# Patient Record
Sex: Female | Born: 1985 | Race: Black or African American | Marital: Single | Smoking: Current every day smoker
Health system: Northeastern US, Academic
[De-identification: ages and names within clinical notes are randomized; demographics above are authoritative.]

## PROBLEM LIST (undated history)

## (undated) DIAGNOSIS — F53 Postpartum depression: Secondary | ICD-10-CM

## (undated) DIAGNOSIS — R011 Cardiac murmur, unspecified: Secondary | ICD-10-CM

## (undated) DIAGNOSIS — Z6841 Body Mass Index (BMI) 40.0 and over, adult: Secondary | ICD-10-CM

## (undated) DIAGNOSIS — T1490XA Injury, unspecified, initial encounter: Secondary | ICD-10-CM

## (undated) DIAGNOSIS — I509 Heart failure, unspecified: Secondary | ICD-10-CM

## (undated) DIAGNOSIS — J45909 Unspecified asthma, uncomplicated: Secondary | ICD-10-CM

## (undated) DIAGNOSIS — L732 Hidradenitis suppurativa: Secondary | ICD-10-CM

## (undated) DIAGNOSIS — D649 Anemia, unspecified: Secondary | ICD-10-CM

## (undated) DIAGNOSIS — F1410 Cocaine abuse, uncomplicated: Secondary | ICD-10-CM

## (undated) DIAGNOSIS — Z720 Tobacco use: Secondary | ICD-10-CM

## (undated) DIAGNOSIS — B019 Varicella without complication: Secondary | ICD-10-CM

## (undated) DIAGNOSIS — Z9889 Other specified postprocedural states: Secondary | ICD-10-CM

## (undated) DIAGNOSIS — R112 Nausea with vomiting, unspecified: Secondary | ICD-10-CM

## (undated) DIAGNOSIS — Z87442 Personal history of urinary calculi: Secondary | ICD-10-CM

## (undated) DIAGNOSIS — T7840XA Allergy, unspecified, initial encounter: Secondary | ICD-10-CM

## (undated) DIAGNOSIS — G43909 Migraine, unspecified, not intractable, without status migrainosus: Secondary | ICD-10-CM

## (undated) DIAGNOSIS — F32A Depression, unspecified: Secondary | ICD-10-CM

## (undated) DIAGNOSIS — F419 Anxiety disorder, unspecified: Secondary | ICD-10-CM

## (undated) DIAGNOSIS — N189 Chronic kidney disease, unspecified: Secondary | ICD-10-CM

## (undated) DIAGNOSIS — N289 Disorder of kidney and ureter, unspecified: Secondary | ICD-10-CM

## (undated) DIAGNOSIS — F329 Major depressive disorder, single episode, unspecified: Secondary | ICD-10-CM

## (undated) HISTORY — DX: Hidradenitis suppurativa: L73.2

## (undated) HISTORY — PX: TIBIA FRACTURE SURGERY: SHX806

## (undated) HISTORY — DX: Varicella without complication: B01.9

## (undated) HISTORY — DX: Injury, unspecified, initial encounter: T14.90XA

## (undated) HISTORY — PX: DENTAL SURGERY: SHX609

## (undated) HISTORY — DX: Cardiac murmur, unspecified: R01.1

## (undated) HISTORY — PX: GALLBLADDER SURGERY: SHX652

## (undated) HISTORY — DX: Anemia, unspecified: D64.9

## (undated) HISTORY — DX: Postpartum depression: F53.0

## (undated) HISTORY — DX: Allergy, unspecified, initial encounter: T78.40XA

## (undated) HISTORY — PX: MANDIBLE SURGERY: SHX707

## (undated) HISTORY — DX: Chronic kidney disease, unspecified: N18.9

## (undated) MED FILL — Methylprednisolone Sod Succ For Inj 125 MG (Base Equiv): INTRAMUSCULAR | Qty: 1 | Status: AC

## (undated) MED FILL — Famotidine Preservative Free Inj 20 MG/2ML: INTRAVENOUS | Qty: 2 | Status: AC

## (undated) MED FILL — Methylprednisolone Sod Succ For Inj PF 125 MG (Base Equiv): INTRAMUSCULAR | Qty: 1 | Status: AC

---
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
## 2005-09-03 DIAGNOSIS — S92309A Fracture of unspecified metatarsal bone(s), unspecified foot, initial encounter for closed fracture: Secondary | ICD-10-CM | POA: Insufficient documentation

## 2009-03-15 NOTE — L&D Delivery Note (Addendum)
Delivery Summary Note  Patient: Jenna [name]  Age: 24 y.o.  Date of Birth: 12-06-1985  ZOX:WRUEAV.  MRN: [id number]    Admission Summary  Date and time of admission: 11/26/2009  8:30 AM Attending Provider: [name] Rower, MD Group:  Active Hospital Problems   Diagnoses   . Cocaine abuse complicating pregnancy       Review of patient's allergies indicates no known allergies.  Pre-pregnancy weight:PrePregnancy Weight: 105.235 kg (232 lb) Current Weight:   Weight gain:     Obstetric History    G5   P4   T4   P0   A1   TAB1   SAB0   E0   M0   L3         Feeding Type:     Circumcision:     Pediatrician:     Prenatal Labs  RUBELLA IGG AB   Date Value Range Status   08/12/2009 IMMUNE  - (no units) Final    TEST METHOD: EIA      Strep B Culture   Date Value Range Status   11/04/2009 .  - (no units) Final        Gestational Age Information  LMP: No LMP recorded.   EDC: 12/03/2009, by Ultrasound  Gestational Age at Delivery: Information for the patient's newborn:   Noah, Rin[place] Girl [[id number]]   Gestational Age: 90 weeks.       Labor Summary  Labor Events:    Preterm labor: No   Rupture date: 11/26/2009   Rupture time: 10:24 AM   Rupture type: Artificial   Fluid Color: Clear   Induction: None   Augmentation: AROM   Complications:    Cervical ripening:         Stage 1:  hr minutes   Stage 2:  hr minutes   Stage 3: 0 hr       Delivery:    Episiotomy: None   Lacerations: None   Repair suture:    Repair # of packets: 4   Blood loss (ml): 700     Information for the patient's newborn:   Zhania, [place] Girl [[id number]]   Delivery  Patient: Girl Hasbrook  VHQ:IONGEX GA: Gestational Age: 90 weeks. MRN: [id number]  11/26/2009 10:24 AM by  C-Section, Low Transverse    Delivery Clinician:  [name] Rower  Living?: Yes  Anesthesia: Spinal         APGARS  One minute Five minutes Ten minutes   Skin color: 0   0   1     Heart rate: 2   2   2      Grimace: 2   2   2      Muscle tone: 2   2   2      Breathing: 2   2   2      Totals: 8  8  9     Presentation/position:  Vertex  Left Occiput Anterior  Resuscitation: Suctioning   Cord information: 3 Vessels   Disposition of cord blood: Lab    Blood gases sent? Yes  Complications: Nuchal   Placenta: Delivered: 11/26/2009 10:27 AM  Manual Removal  Intact appearance  Newborn Measurements:  Weight: 7 lb 13.2 oz (3549 g)  Height:   Head circumference:   Chest circumference:    Other providers: NICU/SCN NP  NICU/SCN Resident  NICU/SCN RN  Delivery Nurse  Delivery Assist Delmer Islam Belardino  Desiree M Stockholm  Wister  Additional  information:  Forceps:    Vacuum:    Breech:    Observed anomalies            Scheduled repeat LTCS. Viable female infant delivered in LOA presentation. Nuchalx1  Clear fluid. Gases obtained and sent. Placenta intact - discarded.   Single layer uterine closure with 2 additional figure of 8 suture in mid-hysterotomy region for hemostasis. Vicryl on fascia. 3 interrupted suture in sub-Q for reapproximation. Staples on skin.   Tegaderm dressing.     Edd Arbour, MD   OBGYN Resident   Pager# [phone]     Staffed repeat Cesarean section without complications.    Gunnar Fusi MD  Attending, Maternal-Fetal Medicine

## 2009-05-13 ENCOUNTER — Emergency Department: Admit: 2009-05-13 | Discharge status: Against Medical Advice | Payer: Self-pay | Source: Ambulatory Visit

## 2009-06-16 ENCOUNTER — Ambulatory Visit: Payer: Self-pay

## 2009-07-03 ENCOUNTER — Ambulatory Visit: Payer: Self-pay

## 2009-08-12 ENCOUNTER — Other Ambulatory Visit: Payer: Self-pay | Admitting: Obstetrics and Gynecology

## 2009-08-12 ENCOUNTER — Ambulatory Visit: Payer: Self-pay

## 2009-08-12 ENCOUNTER — Encounter: Payer: Self-pay | Admitting: Gastroenterology

## 2009-08-12 LAB — CBC
Hematocrit: 32
Hemoglobin: 10.5
Platelets: 245

## 2009-08-12 LAB — DRUG SCREEN CHEMICAL DEPENDENCY, URINE
Amphetamine,UR: NEGATIVE
Benzodiazepinen,UR: NEGATIVE
Cocaine/Metab,UR: NEGATIVE
Opiates,UR: NEGATIVE
THC Metabolite,UR: NEGATIVE

## 2009-08-12 LAB — GLUCOSE: Glucose,WB: 122

## 2009-08-12 LAB — RUBELLA ANTIBODY, IGG: Rubella IgG AB: IMMUNE

## 2009-08-12 LAB — TYPE AND SCREEN FOR PNP
ABO RH Blood Type: A POS
Antibody Screen: NEGATIVE

## 2009-08-12 LAB — GLUCOSE TOLERANCE, 1 HOUR: Glucose,50gm 1HR: 122 mg/dL (ref 63–135)

## 2009-08-12 LAB — TYPE AND SCREEN: Antibody Screen: NEGATIVE

## 2009-08-12 LAB — RPR: RPR Screen: NONREACTIVE

## 2009-08-13 LAB — OBSTETRICS PANEL
Baso # K/uL: 0 THOU/uL (ref 0.0–0.1)
Basophil %: 0.2 % (ref 0.1–1.2)
Eos # K/uL: 0.1 THOU/uL (ref 0.0–0.4)
Eosinophil %: 1.1 % (ref 0.7–5.8)
Hematocrit: 32 % — ABNORMAL LOW (ref 34–45)
Hemoglobin: 10.5 g/dL — ABNORMAL LOW (ref 11.2–15.7)
Lymph # K/uL: 2.3 THOU/uL (ref 1.2–3.7)
Lymphocyte %: 21.6 % (ref 19.3–51.7)
MCV: 93 fL (ref 79–95)
Mono # K/uL: 0.7 THOU/uL (ref 0.2–0.9)
Monocyte %: 6.7 % (ref 4.7–12.5)
Neut # K/uL: 7.6 THOU/uL — ABNORMAL HIGH (ref 1.6–6.1)
Platelets: 245 THOU/uL (ref 160–370)
RBC: 3.5 MIL/uL — ABNORMAL LOW (ref 3.9–5.2)
RDW: 16 % — ABNORMAL HIGH (ref 11.7–14.4)
RPR Screen: NONREACTIVE
Rubella IgG AB: IMMUNE
Seg Neut %: 70.4 % (ref 34.0–71.1)
WBC: 10.8 THOU/uL — ABNORMAL HIGH (ref 4.0–10.0)

## 2009-08-13 LAB — HEPATITIS B SURFACE ANTIGEN: HBV S Ag: NEGATIVE

## 2009-08-13 LAB — AEROBIC CULTURE: Aerobic Culture: 0

## 2009-08-13 LAB — HIV-1 AND 2 AB: HIV 1&2 Ab screen: NEGATIVE

## 2009-08-13 LAB — HEMOGLOBIN ELECTROPHORESIS
Hgb A1: 97.6 % (ref 96.1–98.0)
Hgb A2: 2.4 % (ref 0.0–3.5)

## 2009-08-14 LAB — LEAD VENOUS: Lead,Venous: 1 ug/dL (ref 0–25)

## 2009-08-20 LAB — CYSTIC FIBROSIS DNA (BAYLOR)

## 2009-08-22 ENCOUNTER — Observation Stay
Admit: 2009-08-22 | Disposition: A | Discharge status: Home / Self Care | Payer: Self-pay | Source: Ambulatory Visit | Attending: Obstetrics & Gynecology | Admitting: Obstetrics & Gynecology

## 2009-08-22 ENCOUNTER — Encounter: Payer: Self-pay | Admitting: Obstetrics & Gynecology

## 2009-08-22 LAB — URINALYSIS WITH REFLEX TO CULTURE
Specific Gravity,UA: 1.021 (ref 1.002–1.030)
pH,UA: 6.5 (ref 5.0–8.0)

## 2009-08-22 LAB — GRAM STAIN

## 2009-08-22 LAB — URINALYSIS REFLEX TO CULTURE
Blood,UA: NEGATIVE
Ketones, UA: NEGATIVE
Nitrite,UA: NEGATIVE
Protein,UA: 10 mg/dL — AB

## 2009-08-22 LAB — URINE MICROSCOPIC (IQ200)
RBC,UA: 1 /HPF (ref 0–2)
WBC,UA: 1 /HPF (ref 0–5)

## 2009-08-22 MED ORDER — METRONIDAZOLE 500 MG PO TABS *I*
500.0000 mg | ORAL_TABLET | Freq: Two times a day (BID) | ORAL | Status: AC
Start: 2009-08-22 — End: 2009-08-29

## 2009-08-22 NOTE — Discharge Instructions (Signed)
Reason for Triage Visit: vaginal discharge  Destination: Home  Mode: Ambulatory    Procedures done in triage: Fetal monitoring, Sterile speculum exam and Cervical exam  Pending Laboratory Data: Vaginal culture (s) and GC/Chl, axillary culture, UA    Diagnosis: There are no hospital problems to display for this patient.      Diet: Regular and Drink at least 10-12 glasses of fluid daily    Activity: Regular activity    Special Instructions: call for appt at [facility] as soon as possible for prenatal care.

## 2009-08-22 NOTE — Progress Notes (Signed)
TRIAGE NOTE:    CC: abdominal pain, axillary bumps, vaginal discharge      HPI:  24 y.o. G1P0 at 25.[redacted] weeks EGA presents with complaint of fishy smelling vaginal discharge.  Discharge is white.  Patient just released from jail yesterday for crack/parole violation.  She has abdominal pain on right mid abdomen that is mild, but constant.  Pain is not contractions like.   She also reports recurrent armpit bumps.  She has two currently, one is oozing.  She denies vaginal bleeding and feels good FM.   No urinary symptoms currently but patient recently on antibiotic for 4 days for concern for UTI.  She was told to stop this medication.       PREGNANCY RISKS:  PSA  Recent incarceration  Hx C/S  Hx of infant death from SIDS      OBHX  OB History    Grav Para Term Preterm Abortions TAB SAB Ect Mult Living    1                # Outc Date GA Lbr Len/2nd Wgt Sex Del Anes PTL Lv    1 CUR                     ALL:  Allergies no known allergies     SocHx:  History   Social History   . Marital Status: Single     Spouse Name: N/A     Number of Children: N/A   . Years of Education: N/A   Occupational History   . Not on file.   Social History Main Topics   . Smoking status: Not on file   . Smokeless tobacco: Not on file   . Alcohol Use: Not on file   . Drug Use: Not on file   . Sexually Active: Not on file   Other Topics Concern   . Not on file   Social History Narrative   . No narrative on file          PE:  Patient Vitals in the past 24 hrs:   BP Temp Temp src Pulse Resp   08/22/09 1710 120/79 mmHg 36.8 C (98.2 F) TEMPORAL 88  18        HEENT: Normocephalic; atraumatic  Cardiovascular: Not assessed  Respiratory: Not assessed  Abdomen: Gravid non-tender and no tenderness to palpation, no CVA tenderness  Neurological: Not assessed  Extremities/Skin: Right axillary pustule with yellow discharge, Second inflamed raised lesion without induration or fluctuance.      Pelvic Exam:                Dilation: 0 cm          Effacement: 0%              Station: Not Applicable    Exam method: SSE/digital  Pooling: negative  Nitrazine: n/a  Membranes:  intact  Cultures collected: gonorrhea/chlamydia, right axillary dishcharge  Wet prep:  Whiff: yes     Clue cells: yes      Trich: no       Yeast:  no      EFM:  Baseline:  140 bpm       Variability:  moderate     Accels: present     Decels: none  Cat  one  Toco: one contraction not appreciated by patient  Labor Assessment:  No labor    Labs:  No results found for this or any  previous visit (from the past 24 hour(s)).      A/P: 24 y/o G5P3012 at 25.[redacted] weeks EGA with BV infection and likely inflamed folliculitis vs furuncle.    Furuncle/Folliculitis - discharge from wound cultured.  F/U on results.  Recommend scheduling appt with internal medicine for further evaluation.    GC/Chl sent for culture  BV - Flagyl 500mg  BID x 7 days  FHR - Cat I, reactive by 10x10 criteria.  Abdominal pain benign.  Likely associated with BV infection.  PTL precautions given.    F/U with [facility] as soon as possible for prenatal care.

## 2009-08-25 LAB — CHLAMYDIA PLASMID DNA AMPLIFICATION: Chlamydia Plasmid DNA Amplification: 0

## 2009-08-25 LAB — N. GONORRHOEAE DNA AMPLIFICATION: N. gonorrhoeae DNA Amplification: 0

## 2009-08-26 LAB — ANAEROBIC CULTURE

## 2009-08-26 LAB — AEROBIC CULTURE

## 2009-08-27 ENCOUNTER — Ambulatory Visit: Payer: Self-pay

## 2009-09-04 ENCOUNTER — Other Ambulatory Visit: Payer: Self-pay | Admitting: Obstetrics & Gynecology

## 2009-09-04 ENCOUNTER — Ambulatory Visit: Payer: Self-pay

## 2009-09-05 ENCOUNTER — Ambulatory Visit: Admit: 2009-09-05 | Payer: Self-pay | Source: Ambulatory Visit | Admitting: Obstetrics and Gynecology

## 2009-09-05 LAB — AEROBIC CULTURE: Aerobic Culture: 0

## 2009-09-18 ENCOUNTER — Ambulatory Visit: Payer: Self-pay

## 2009-09-25 ENCOUNTER — Ambulatory Visit: Payer: Self-pay

## 2009-09-25 ENCOUNTER — Other Ambulatory Visit: Payer: Self-pay | Admitting: Gastroenterology

## 2009-10-09 ENCOUNTER — Ambulatory Visit: Payer: Self-pay | Admitting: Nutritionist

## 2009-10-09 ENCOUNTER — Ambulatory Visit: Payer: Self-pay

## 2009-10-23 ENCOUNTER — Ambulatory Visit: Payer: Self-pay

## 2009-11-04 ENCOUNTER — Ambulatory Visit: Payer: Self-pay

## 2009-11-04 ENCOUNTER — Other Ambulatory Visit: Payer: Self-pay | Admitting: Obstetrics and Gynecology

## 2009-11-04 LAB — CBC
Hematocrit: 30 % — ABNORMAL LOW (ref 34–45)
Hemoglobin: 10.1 g/dL — ABNORMAL LOW (ref 11.2–15.7)
MCV: 92 fL (ref 79–95)
Platelets: 214 THOU/uL (ref 160–370)
RBC: 3.3 MIL/uL — ABNORMAL LOW (ref 3.9–5.2)
RDW: 14.7 % — ABNORMAL HIGH (ref 11.7–14.4)
WBC: 10.7 THOU/uL — ABNORMAL HIGH (ref 4.0–10.0)

## 2009-11-04 LAB — GC CULTURE: GC Culture: NEGATIVE

## 2009-11-04 LAB — CHLAMYDIA PLASMID DNA AMPLIFICATION: Chlamydia Plasmid DNA Amplification: NEGATIVE

## 2009-11-04 LAB — HIV-1 AND 2 AB: HIV 1&2 Ab screen: NEGATIVE

## 2009-11-05 LAB — CHLAMYDIA PLASMID DNA AMPLIFICATION: Chlamydia Plasmid DNA Amplification: 0

## 2009-11-05 LAB — HIV-1 AND 2 AB: HIV 1&2 Ab screen: NEGATIVE

## 2009-11-05 LAB — RPR: RPR Screen: NONREACTIVE

## 2009-11-05 LAB — N. GONORRHOEAE DNA AMPLIFICATION: N. gonorrhoeae DNA Amplification: 0

## 2009-11-06 LAB — GROUP B STREP CULTURE: Group B Strep Culture: 0

## 2009-11-09 LAB — GYN CYTOLOGY

## 2009-11-11 ENCOUNTER — Ambulatory Visit: Payer: Self-pay

## 2009-11-18 ENCOUNTER — Ambulatory Visit: Payer: Self-pay

## 2009-11-18 LAB — COMPREHENSIVE METABOLIC PANEL
ALT: 11 U/L (ref 0–35)
AST: 18 U/L (ref 0–35)
Albumin: 3.8 g/dL (ref 3.5–5.2)
Alk Phos: 133 U/L — ABNORMAL HIGH (ref 35–105)
Anion Gap: 8 (ref 7–16)
Bilirubin,Total: 0.2 mg/dL (ref 0.0–1.2)
CO2: 24 mmol/L (ref 20–28)
Calcium: 10 mg/dL (ref 9.0–10.4)
Chloride: 104 mmol/L (ref 96–108)
Creatinine: 0.59 mg/dL (ref 0.51–0.95)
GFR,Black: >59 *
GFR,Caucasian: >59 *
Glucose: 75 mg/dL (ref 74–106)
Lab: 4 mg/dL — ABNORMAL LOW (ref 6–20)
Potassium: 4.3 mmol/L (ref 3.3–5.1)
Sodium: 136 mmol/L (ref 133–145)
Total Protein: 6.5 g/dL (ref 6.3–7.7)

## 2009-11-18 LAB — GRAM STAIN

## 2009-11-18 LAB — CBC
Hematocrit: 33 % — ABNORMAL LOW (ref 34–45)
Hemoglobin: 10.6 g/dL — ABNORMAL LOW (ref 11.2–15.7)
MCV: 94 fL (ref 79–95)
Platelets: 223 THOU/uL (ref 160–370)
RBC: 3.5 MIL/uL — ABNORMAL LOW (ref 3.9–5.2)
RDW: 15.1 % — ABNORMAL HIGH (ref 11.7–14.4)
WBC: 10.3 THOU/uL — ABNORMAL HIGH (ref 4.0–10.0)

## 2009-11-18 LAB — URIC ACID: Urate: 5.5 mg/dL (ref 2.7–6.8)

## 2009-11-18 LAB — LACTATE DEHYDROGENASE: LD: 163 U/L (ref 118–225)

## 2009-11-18 LAB — MRSA (ORSA) AMPLIFICATION: MRSA (ORSA) Amplification: 0

## 2009-11-20 LAB — AEROBIC CULTURE

## 2009-11-23 ENCOUNTER — Observation Stay
Admit: 2009-11-23 | Disposition: A | Discharge status: Home / Self Care | Payer: Self-pay | Source: Ambulatory Visit | Attending: Obstetrics & Gynecology | Admitting: Obstetrics & Gynecology

## 2009-11-23 ENCOUNTER — Encounter: Payer: Self-pay | Admitting: Maternal & Fetal Medicine

## 2009-11-23 DIAGNOSIS — Z720 Tobacco use: Secondary | ICD-10-CM | POA: Insufficient documentation

## 2009-11-23 DIAGNOSIS — L732 Hidradenitis suppurativa: Secondary | ICD-10-CM | POA: Insufficient documentation

## 2009-11-23 DIAGNOSIS — Z98891 History of uterine scar from previous surgery: Secondary | ICD-10-CM | POA: Insufficient documentation

## 2009-11-23 DIAGNOSIS — Z331 Pregnant state, incidental: Secondary | ICD-10-CM | POA: Insufficient documentation

## 2009-11-23 LAB — URINALYSIS WITH REFLEX TO CULTURE
Specific Gravity,UA: 1.007 (ref 1.002–1.030)
pH,UA: 7 (ref 5.0–8.0)

## 2009-11-23 LAB — URINALYSIS REFLEX TO CULTURE
Blood,UA: NEGATIVE
Ketones, UA: NEGATIVE
Leuk Esterase,UA: NEGATIVE
Nitrite,UA: NEGATIVE
Protein,UA: NEGATIVE mg/dL

## 2009-11-23 MED ORDER — LABETALOL HCL 200 MG PO TABS *I*
200.0000 mg | ORAL_TABLET | Freq: Once | ORAL | Status: DC
Start: 2009-11-23 — End: 2009-11-23

## 2009-11-23 NOTE — Discharge Instructions (Signed)
Reason for Triage Visit: NST and Rule out ROM  Destination: Home  Mode: Ambulatory    Procedures done in triage: Fetal monitoring and Sterile speculum exam  Pending Laboratory Data: None    Diagnosis: Active Hospital Problems   Diagnoses   . Pregnancy [V22.2C]   . Cesarean section [669.70AH]   . Hidradenitis [705.83]   . Tobacco abuse [305.1U]      Resolved Hospital Problems   Diagnoses         Diet: Regular    Activity: Regular activity    Special Instructions: none

## 2009-11-23 NOTE — Progress Notes (Signed)
Pt into triage via ambulance from [facility]. ED Referral states FHR found to be 210bpm. Pt c/o DFM, and increase in vaginal d/c. Placed on EFM, and Dr Onnie Boer notified of her arrival.

## 2009-11-23 NOTE — OB Triage Note (Signed)
OB Triage Note    CC: fetal tachycardia noted on dop tones at jail    HPI: This is a 24 yo [id number] incarcerated female at 35 4/7 weeks who presents after dop tones at jail showed a fetal heart rate in the 220s. The patient denies contractions, leakage of fluid or vaginal bleeding. She has noticed urinary frequency. No dysuria, fever or chills. Her axillary hidradenitis is still draining, but she believes it is improving. No other complaints.    Pregnancy Risks:   Depression (no meds)  History of LTCS (desires repeat LTCS)  Incarcerated  Crack use in pregnancy  Tobacco use  Axillary hidradenitis    Vital Signs:  Blood pressure 107/68, pulse 82, temperature 36.5 C (97.7 F), temperature source Temporal, resp. rate 20.    Physical Exam:  General- NAD  Cardiac- RRR  Resp- CTAB/L  Abdomen- soft, gravid, non-tender  Ext- trace edema    Cervix:  Visually closed    Fetal Heart Tracing:  Baseline: 135  Variability: moderate  Accels: present  Decels: none  Category: I    No pooling, negative nitrazine, no ferning.    Toco: no ctx    Wet Prep: no clue cells, no yeast, negative whiff, no trichomonas    Assessment/Plan:  This is a 24 yo G5P3012 at 1 4/7 weeks with fetal tachycardia noted on dop tones.    1. FHT: Category I. Reactive NST.  2. Toco: no ctx  3. Labor: No evidence of labor. Cervix is closed.  4. Leakage of fluid: No evidence of rupture of membranes. Negative wet prep.  5. Hidradenitis: Patient recently had cultures that were similar to those taken several months ago (positive for diphtheroids and proteus). She has already completed 2 courses of antibiotics this month. She is scheduled to see plastic surgery postpartum for definitive management. No need for antibiotics at this time.  6. Will discharge now. Patient to follow-up with [facility] this week.    D/W Dr. Caesar Chestnut, MD  Ob/Gyn Resident, R2  Pager # [phone]

## 2009-11-24 ENCOUNTER — Observation Stay
Admit: 2009-11-24 | Payer: Self-pay | Source: Ambulatory Visit | Attending: Obstetrics and Gynecology | Admitting: Obstetrics and Gynecology

## 2009-11-25 ENCOUNTER — Ambulatory Visit: Payer: Self-pay

## 2009-11-25 LAB — DRUG SCREEN CHEMICAL DEPENDENCY, URINE
Amphetamine,UR: NEGATIVE
Benzodiazepinen,UR: NEGATIVE
Cocaine/Metab,UR: NEGATIVE
Opiates,UR: NEGATIVE
THC Metabolite,UR: NEGATIVE

## 2009-11-25 LAB — CBC
Hematocrit: 33 % — ABNORMAL LOW (ref 34–45)
Hemoglobin: 10.6 g/dL — ABNORMAL LOW (ref 11.2–15.7)
MCV: 94 fL (ref 79–95)
Platelets: 215 THOU/uL (ref 160–370)
RBC: 3.5 MIL/uL — ABNORMAL LOW (ref 3.9–5.2)
RDW: 15 % — ABNORMAL HIGH (ref 11.7–14.4)
WBC: 10.7 THOU/uL — ABNORMAL HIGH (ref 4.0–10.0)

## 2009-11-25 LAB — TYPE AND SCREEN
ABO RH Blood Type: A POS
Antibody Screen: NEGATIVE

## 2009-11-26 ENCOUNTER — Inpatient Hospital Stay
Admit: 2009-11-26 | Disposition: A | Discharge status: Home / Self Care | Payer: Self-pay | Source: Ambulatory Visit | Attending: Gynecology | Admitting: Gynecology

## 2009-11-26 ENCOUNTER — Encounter: Payer: Self-pay | Admitting: Gynecology

## 2009-11-26 DIAGNOSIS — F1410 Cocaine abuse, uncomplicated: Secondary | ICD-10-CM | POA: Insufficient documentation

## 2009-11-26 LAB — CHEMICAL DEPENDENCY SCREEN 8, URINE
Amphetamine,UR: NEGATIVE
Barbiturate,UR: NEGATIVE
Benzodiazepinen,UR: NEGATIVE
Cocaine/Metab,UR: NEGATIVE
Methadone Metab,UR: NEGATIVE
Opiates,UR: NEGATIVE
PCP,UR: NEGATIVE
Propoxyphene,UR: NEGATIVE
THC Metabolite,UR: NEGATIVE

## 2009-11-26 MED ORDER — IBUPROFEN 600 MG PO TABS *I*
600.0000 mg | ORAL_TABLET | Freq: Four times a day (QID) | ORAL | Status: DC | PRN
Start: 2009-11-26 — End: 2009-11-29
  Administered 2009-11-27 – 2009-11-29 (×8): 600 mg via ORAL
  Filled 2009-11-26 (×8): qty 1

## 2009-11-26 MED ORDER — OXYTOCIN 30 UNITS IN 500ML NS WRAPPED *I*
2.0000 m[IU]/min | INTRAMUSCULAR | Status: DC
Start: 2009-11-26 — End: 2009-11-29
  Administered 2009-11-26: 100 m[IU]/min via INTRAVENOUS

## 2009-11-26 MED ORDER — ONDANSETRON HCL 2 MG/ML IV SOLN *I*
4.0000 mg | Freq: Three times a day (TID) | INTRAMUSCULAR | Status: DC | PRN
Start: 2009-11-26 — End: 2009-11-29

## 2009-11-26 MED ORDER — TETANUS-DIPHTH-ACELL PERT 5-2-15.5 LF-MCG/0.5 IM SUSP *I*
0.5000 mL | INTRAMUSCULAR | Status: AC
Start: 2009-11-26 — End: 2009-11-28
  Administered 2009-11-28: 0.5 mL via INTRAMUSCULAR
  Filled 2009-11-26: qty 0.5

## 2009-11-26 MED ORDER — DOCUSATE SODIUM 100 MG PO CAPS *I*
100.0000 mg | ORAL_CAPSULE | Freq: Two times a day (BID) | ORAL | Status: DC
Start: 2009-11-26 — End: 2009-11-29

## 2009-11-26 MED ORDER — CEFAZOLIN SODIUM 1000 MG IJ SOLR *I*
INTRAMUSCULAR | Status: DC
Start: 2009-11-26 — End: 2009-12-09
  Filled 2009-11-26: qty 20

## 2009-11-26 MED ORDER — KETOROLAC TROMETHAMINE 30 MG/ML IJ SOLN *I*
15.0000 mg | Freq: Once | INTRAMUSCULAR | Status: AC
Start: 2009-11-26 — End: 2009-11-26
  Filled 2009-11-26: qty 1

## 2009-11-26 MED ORDER — IBUPROFEN 600 MG PO TABS *I*
600.0000 mg | ORAL_TABLET | Freq: Four times a day (QID) | ORAL | Status: DC | PRN
Start: 2009-11-26 — End: 2009-11-29

## 2009-11-26 MED ORDER — KETOROLAC TROMETHAMINE 30 MG/ML IJ SOLN *I*
30.0000 mg | Freq: Four times a day (QID) | INTRAMUSCULAR | Status: AC
Start: 2009-11-26 — End: 2009-11-27
  Administered 2009-11-26 – 2009-11-27 (×4): 30 mg via INTRAVENOUS
  Filled 2009-11-26 (×3): qty 1

## 2009-11-26 MED ORDER — PROMETHAZINE HCL 25 MG/ML IJ SOLN *I*
INTRAMUSCULAR | Status: DC
Start: 2009-11-26 — End: 2009-11-26
  Filled 2009-11-26: qty 1

## 2009-11-26 MED ORDER — SIMETHICONE 80 MG PO CHEW *I*
80.0000 mg | CHEWABLE_TABLET | Freq: Four times a day (QID) | ORAL | Status: DC
Start: 2009-11-26 — End: 2009-11-29
  Administered 2009-11-26 – 2009-11-28 (×7): 80 mg via ORAL
  Filled 2009-11-26 (×8): qty 1

## 2009-11-26 MED ORDER — LACTATED RINGERS IV SOLN *I*
150.0000 mL/h | INTRAVENOUS | Status: DC
Start: 2009-11-26 — End: 2009-11-29

## 2009-11-26 MED ORDER — OXYCODONE HCL 10 MG PO TB12 *A*
ORAL_TABLET | ORAL | Status: DC
Start: 2009-11-26 — End: 2009-12-09
  Filled 2009-11-26: qty 1

## 2009-11-26 MED ORDER — CEFAZOLIN SODIUM 1000 MG IJ SOLR *I*
2000.0000 mg | Freq: Once | INTRAMUSCULAR | Status: AC
Start: 2009-11-26 — End: 2009-11-26
  Administered 2009-11-26: 2000 mg via INTRAVENOUS

## 2009-11-26 MED ORDER — OXYCODONE-ACETAMINOPHEN 5-325 MG PO TABS *I*
2.0000 | ORAL_TABLET | ORAL | Status: DC | PRN
Start: 2009-11-26 — End: 2009-11-29
  Administered 2009-11-26 – 2009-11-29 (×14): 2 via ORAL
  Filled 2009-11-26 (×14): qty 2

## 2009-11-26 MED ORDER — NALBUPHINE HCL 10 MG/ML IJ SOLN *I*
3.0000 mg | INTRAMUSCULAR | Status: DC | PRN
Start: 2009-11-26 — End: 2009-11-29
  Administered 2009-11-26 – 2009-11-27 (×3): 3 mg via INTRAVENOUS
  Filled 2009-11-26 (×3): qty 1

## 2009-11-26 MED ORDER — DOCUSATE SODIUM 100 MG PO CAPS *I*
100.0000 mg | ORAL_CAPSULE | Freq: Two times a day (BID) | ORAL | Status: DC | PRN
Start: 2009-11-26 — End: 2009-11-29
  Administered 2009-11-27 – 2009-11-29 (×4): 100 mg via ORAL
  Filled 2009-11-26 (×4): qty 1

## 2009-11-26 MED ORDER — OXYCODONE-ACETAMINOPHEN 5-325 MG PO TABS *I*
2.0000 | ORAL_TABLET | ORAL | Status: DC | PRN
Start: 2009-11-26 — End: 2009-11-29

## 2009-11-26 MED ORDER — PROMETHAZINE HCL 25 MG/ML IJ SOLN *I*
12.5000 mg | Freq: Four times a day (QID) | INTRAMUSCULAR | Status: DC | PRN
Start: 2009-11-26 — End: 2009-11-29
  Administered 2009-11-26: 12.5 mg via INTRAVENOUS

## 2009-11-26 MED ORDER — OXYCODONE-ACETAMINOPHEN 5-325 MG PO TABS *I*
1.0000 | ORAL_TABLET | ORAL | Status: DC | PRN
Start: 2009-11-26 — End: 2009-11-29

## 2009-11-26 MED ORDER — PROMETHAZINE HCL 25 MG/ML IJ SOLN *I*
6.2500 mg | Freq: Once | INTRAMUSCULAR | Status: AC | PRN
Start: 2009-11-26 — End: 2009-11-26

## 2009-11-26 MED ORDER — DIPHENHYDRAMINE HCL 25 MG PO TABS *I*
25.0000 mg | ORAL_TABLET | Freq: Every evening | ORAL | Status: DC | PRN
Start: 2009-11-26 — End: 2009-11-29

## 2009-11-26 NOTE — Progress Notes (Signed)
Patient meets PACU D/C requirements, awaiting PACU discharge by Dr. Evonnie Dawes anesthesia attending.  Monitors off at this time, patient for transfer to birthcenter    Tyler Aas, RN

## 2009-11-26 NOTE — Anesthesia Pre-procedure Eval (Signed)
Anesthesia Pre-operative Evaluation for [facility]  Health History  History reviewed.  No pertinent past medical history.  History reviewed.  No pertinent past surgical history.  Social History  History   Substance Use Topics   . Smoking status: Current Everyday Smoker -- 0.2 packs/day   . Smokeless tobacco: Not on file   . Alcohol Use: No      History   Drug Use   . Yes   . Special: Cocaine       Allergies: Allergies no known allergies  Medications   Prescriptions prior to admission   Medication Sig   . Prenatal Vit-Fe Sulfate-FA (PRENATAL VITAMIN PO) Take 1 tablet by mouth daily.   Marland Kitchen docusate sodium (COLACE) 100 MG capsule Take 100 mg by mouth 2 times daily.        No current facility-administered medications on file.       Medications Administered by Facility in Past 24hrs       Anesthesia Evaluation      No hx of anesthetic complications   Airway   Mallampati: II  TM distance: <3 FB  Neck ROM: full  Dental      Pulmonary - negative ROS and normal exam   Cardiovascular - negative ROS and normal exam  Exercise tolerance: poor (SOB on ambulation, denies CP)  Rhythm: regular    Rate: normal    Neuro/Psych - negative ROS   GI/Hepatic/Renal    (+) GERD well controlled,     Endo/Other - negative ROS   Abdominal                 Additional ROS/Co-morbidities: None known    Mental Status: alert, oriented to person, place, and time, normal mood, behavior, speech, dress, motor activity, and thought processes, affect appropriate to mood    Last PO Intake: 2130 on 11/26/09    Most Recent Vitals: BP 134/75  Pulse 81  Temp(Src) 36.2 C (97.2 F) (Temporal)  Resp 18  Ht 1.651 m (5\' 5" )  Vital Sign Ranges (last 24hrs)  Temp:  [36.2 C (97.2 F)] 36.2 C (97.2 F)  Heart Rate:  [81] 81   Resp:  [18] 18   BP: (134)/(75) 134/75 mmHg   O2 Device: None (Room air) (11/26/09 0840)    Most Recent Lab Results   Blood Type  Lab Results   Component Value Date    ABORH A RH POS 11/25/2009    ABS Negative 11/25/2009          CBC  Lab  Results   Component Value Date    WBC 10.7* 11/25/2009    HCT 33* 11/25/2009    PLT 215 11/25/2009      Chem-7  Lab Results   Component Value Date    NA 136 11/18/2009    K 4.3 11/18/2009    CL 104 11/18/2009    CO2 24 11/18/2009    BUN 4* 11/18/2009    CREATININE 0.59 11/18/2009       Electrolytes  Lab Results   Component Value Date    CALCIUM 10.0 11/18/2009      Coags  No results found for this basename: Protime, INR, APTT      LFTs  Lab Results   Component Value Date    AST 18 11/18/2009    ALT 11 11/18/2009    ALKPHOS 133* 11/18/2009      Bilirubin, Total   Date Value Range Status   11/18/2009 0.2  0.0-1.2 (mg/dL) Final  Pregnancy Test (if applicable)  No results found for this basename: pupt, UPREG, PREGTESTUR, PREGSERUM, HCG, HCGQUANT         EKG Results    All labs in the last 24 hours   Recent Results (from the past 24 hour(s))   DRUG SCREEN CHEMICAL DEPENDENCY, URINE    Collection Time    11/25/09  9:41 AM   Component Value Range   . Amphetamine Screen, Ur NEG     . Cocaine Metabolites, Ur NEG     . Benzodiazepine Screen, Urine NEG     . Opiate Screen, Urine NEG     . Marijuana Metabolite NEG     . Remark,UR see text     CBC    Collection Time    11/25/09  9:41 AM   Component Value Range   . WBC 10.7 (*) 4.0-10.0 (THOU/uL)   . RBC 3.5 (*) 3.9-5.2 (MIL/uL)   . Hemoglobin 10.6 (*) 11.2-15.7 (g/dL)   . Hematocrit 33 (*) 34-45 (%)   . MCV 94  79-95 (fL)   . RDW 15.0 (*) 11.7-14.4 (%)   . Platelets 215  160-370 (THOU/uL)   TYPE AND SCREEN    Collection Time    11/25/09  9:41 AM   Component Value Range   . ABO,RH BLOOD TYPE A RH POS     . Antibody Screen Negative           Medical Problems  Patient Active Problem List   Diagnoses Date Noted   . Pregnancy [V22.2C] 11/23/2009   . Cesarean section [669.70AH] 11/23/2009   . Hidradenitis [705.83] 11/23/2009   . Tobacco abuse [305.1U] 11/23/2009       PreOp/PreAn Diagnosis: term IUP    Planned Procedure:  Repeat LTCS    Anesthesia Plan    ASA 2   Spinal   Spinal anasthesia  Anesthetic  plan and risks discussed with patient.    Plan discussed with attending.        Anesthesia Risks discussed: hypotension, headache, nerve injury, failed block, bleeding, infection     Invasive Monitoring discussed:  none    PEC/PreOp Attestation: Anesthesia options were discussed with the patient or proxy and they understand the risks and benefits of the various anesthetic options.    Author: Sharlet Salina, DO Note created: 11/26/2009  at: 9:11 AM

## 2009-11-26 NOTE — Anesthesia Pre-procedure Eval (Signed)
Anesthesia Pre-operative Evaluation for [facility]  Health History  History reviewed.  No pertinent past medical history.  No past surgical history on file.  Social History  History   Substance Use Topics   . Smoking status: Current Everyday Smoker -- 0.2 packs/day   . Smokeless tobacco: Not on file   . Alcohol Use: No      History   Drug Use   . Yes   . Special: Cocaine (none since 06/2009, per patient) - no evidence of acute intoxication on 9/14       Allergies: No Known Allergies  Medications   Prescriptions prior to admission   Medication Sig   . Prenatal Vit-Fe Sulfate-FA (PRENATAL VITAMIN PO) Take 1 tablet by mouth daily.   Marland Kitchen docusate sodium (COLACE) 100 MG capsule Take 100 mg by mouth 2 times daily.        No current facility-administered medications on file.       Medications Administered by Facility in Past 24hrs       Anesthesia Evaluation      No hx of anesthetic complications   Airway   Mallampati: II  TM distance: >3 FB  Neck ROM: full  Dental    Comment: Several missing, but none loose or chipper per patient    Pulmonary - negative ROS and normal exam    breath sounds clear to auscultation  Cardiovascular - negative ROS and normal exam  Exercise tolerance: good (SOB on ambulation, denies CP)  Rhythm: regular    Rate: normal    Neuro/Psych - negative ROS   GI/Hepatic/Renal - negative ROS     Endo/Other - negative ROS   Abdominal                   Additional ROS/Co-morbidities: None known    Mental Status: alert, oriented to person, place, and time, normal mood, behavior, speech, dress, motor activity, and thought processes, affect appropriate to mood    Last PO Intake: solids at [place] on 11/26/09    Most Recent Vitals: BP 134/75  Pulse 81  Temp(Src) 36.2 C (97.2 F) (Temporal)  Resp 18  Ht 1.651 m (5\' 5" )  Vital Sign Ranges (last 24hrs)  Temp:  [36.2 C (97.2 F)] 36.2 C (97.2 F)  Heart Rate:  [81] 81   Resp:  [18] 18   BP: (134)/(75) 134/75 mmHg   O2 Device: None (Room air) (11/26/09 0840)    Most  Recent Lab Results   Blood Type  Lab Results   Component Value Date    ABORH A RH POS 11/25/2009    ABS Negative 11/25/2009          CBC  Lab Results   Component Value Date    WBC 10.7* 11/25/2009    HCT 33* 11/25/2009    PLT 215 11/25/2009      Chem-7  Lab Results   Component Value Date    NA 136 11/18/2009    K 4.3 11/18/2009    CL 104 11/18/2009    CO2 24 11/18/2009    BUN 4* 11/18/2009    CREATININE 0.59 11/18/2009       Electrolytes  Lab Results   Component Value Date    CALCIUM 10.0 11/18/2009      Coags  No results found for this basename: Protime,  INR,  APTT      LFTs  Lab Results   Component Value Date    AST 18 11/18/2009  ALT 11 11/18/2009    ALKPHOS 133* 11/18/2009      Bilirubin, Total   Date Value Range Status   11/18/2009 0.2  0.0-1.2 (mg/dL) Final          Pregnancy Test (if applicable)  No results found for this basename: pupt,  UPREG,  PREGTESTUR,  PREGSERUM,  HCG,  HCGQUANT         EKG Results    All labs in the last 24 hours   Recent Results (from the past 24 hour(s))   DRUG SCREEN CHEMICAL DEPENDENCY, URINE    Collection Time    11/25/09  9:41 AM   Component Value Range   . Amphetamine Screen, Ur NEG     . Cocaine Metabolites, Ur NEG     . Benzodiazepine Screen, Urine NEG     . Opiate Screen, Urine NEG     . Marijuana Metabolite NEG     . Remark,UR see text     CBC    Collection Time    11/25/09  9:41 AM   Component Value Range   . WBC 10.7 (*) 4.0-10.0 (THOU/uL)   . RBC 3.5 (*) 3.9-5.2 (MIL/uL)   . Hemoglobin 10.6 (*) 11.2-15.7 (g/dL)   . Hematocrit 33 (*) 34-45 (%)   . MCV 94  79-95 (fL)   . RDW 15.0 (*) 11.7-14.4 (%)   . Platelets 215  160-370 (THOU/uL)   TYPE AND SCREEN    Collection Time    11/25/09  9:41 AM   Component Value Range   . ABO,RH BLOOD TYPE A RH POS     . Antibody Screen Negative           Medical Problems  Patient Active Problem List   Diagnoses Date Noted   . Cocaine abuse complicating pregnancy [648.40V] 11/26/2009   . Pregnancy [V22.2C] 11/23/2009   . Cesarean section [669.70AH] 11/23/2009   .  Hidradenitis [705.83] 11/23/2009   . Tobacco abuse [305.1U] 11/23/2009       PreOp/PreAn Diagnosis: term IUP with history of prior LTCS and placenta is currently anterior-mid per OB report    Planned Procedure:  Repeat LTCS    Anesthesia Plan    ASA 2   Spinal   Discussed GA, epidural and spinal options. Plan = spinal. Patient accepts plan and risks.  Anesthetic plan and risks discussed with patient.    Plan discussed with resident.        Anesthesia Risks discussed: headache, low blood pressure, failed block, nerve injury     Invasive Monitoring discussed:  none    Attending Attestation: The patient or proxy understand and accept the risks and benefits of the anesthesia plan. By accepting this note, I attest that I have personally performed the history and physical exam and prescribed the anesthetic plan within 48 hours prior to the anesthetic as documented by me above.    Awaiting surgical consent at [phone], before proceeding to OR.    Author: Malena Catholic, MD,PHD Note created: 11/26/2009  at: 9:25 AM

## 2009-11-26 NOTE — Progress Notes (Signed)
Pt admitted to 3622 from PACU post c/s. Hover mat used for transfer from stretcher to bed with Clinical research associate and Radiographer, therapeutic. IV pitocin infusing. Patient drinking water and eating crackers. Pt insisting on eating food. Stating she is passing gas. Menu given to patient for regular diet. SCD's applied. Patient moving legs and self in bed. Baby bands checked and matched against mothers.

## 2009-11-26 NOTE — Progress Notes (Signed)
Social Work:  Patient is known to SW from [facility].  Please see QS chart for full psychosocial assessment.  Patient has been released from [facility] and the plan is that she will "turn herself in" after she is discharged from [facility].  Patient is aware that a CPS referral has been initiated due to her previous history and present situation.  Patient is not enrolled in services at this time and will be relying on family members to provide her with a place to live until after she is re-arrested.  Discharge disposition of baby to be decided by [facility] CPS.  Patient with 4 year history of substance abuse, most recently during pregnancy. Patient has attempted treatment several times.  Also admits to history of MH issues. To re-enroll in treatment post partum.  Plan:  As above.  Will await contact from [facility] CPS.  Will follow for d/c planning.  Denny Peon. [place], LMSW, [phone], beeper [phone]

## 2009-11-26 NOTE — H&P (Addendum)
OB Admission History and Physical    CC: scheduled c-section    HPI: 24 y.o. [id number] [redacted]w[redacted]d by 23wk [name] presents for scheduled c-section. Pregnancy has been c/b incarceration (now released), cocaine use with last use in 06/2009, and h/o prior LTCS for NRFHRT. +FM, no VB, no LOF, no ctx.    Pregnancy Risks:  Cocaine use in pregnancy (last use 06/2009)  Tobacco use in pregnancy  H/o prior c-section (desires repeat)  Recent incarceration (now released)    Obstetrical History:  OB History    Grav Para Term Preterm Abortions TAB SAB Ect Mult Living    5 3 3  1 1    2        # Outc Date GA Lbr Len/2nd Wgt Sex Del Anes PTL Lv    1 TRM 2003-08-12 [redacted]w[redacted]d 02:00 3.26kg(7lb3oz) M SVD       Comments: preeclampsia    2 TRM 2006/08/12 [redacted]w[redacted]d 04:00 4.540JW(1XB1YN) M VAC       Comments: died of SIDS    3 TRM Aug 12, 2007 [redacted]w[redacted]d 07:00 4.082kg(9lb) M LTCS       Comments: FTP    4 CUR             5 TAB                     Past Medical History:  History reviewed.  No pertinent past medical history.     Past Surgical History:  Past Surgical History   Procedure Date   . Cesarean section, low transverse           Home Medications:  See Med Reconciliation    Allergies:  No Known Allergies     Social History:  Tobacco Use:  Yes   Drug Use: Yes crack cocaine (last use 06/2009)  Alcohol Use: No    Family History:  Family History   Problem Relation Age of Onset   . Diabetes     . Hypertension     . Cancer          Gynecologic History:  STIs   Yes h/o trichomonas and chlamydia in the past.  abnormal paps No    Review of Systems:   negative  All other ROS negative unless otherwise noted in HPI.     Prenatal Ultrasounds:  11/04/09: SLIUP at [redacted]w[redacted]d, anterior placenta, vertex, EFW 2973g (73%ile), AFI wnl 58%ile, nml cardiac anatomy  08/12/09: SLIUP at [redacted]w[redacted]d, EFW 660g, ant placenta, normal anatomy except poorly seen cardiac views    Prenatal Labs:  See Results Console                     Physical Exam:  Filed Vitals:    11/26/09 0840   BP: 134/75   Pulse: 81   Temp: 36.2 C (97.2  F)   Resp: 18         HEENT: Normocephalic; atraumatic  Cardiovascular: Regular rate and rhythm with no murmurs  Respiratory: Clear to auscultate  Abdomen: Gravid non-tender  Neurological: Not assessed  Extremities/Skin: Minimal edema of pedal and pretibial (1+)    Pelvic Exam: deferred    Estimated Fetal Weight: 3800 grams by leopolds    Fetal Monitoring:  Baseline: 135 bpm          Variability: Moderate (6-25 BPM)  Pattern: Accelerations  Category: Category I  Toco: acontractile        Assessment/Plan:24 y.o. [id number] at [redacted]w[redacted]d admitted for scheduled repeat LTCS  - Admit  to 05-1398  - T/S and CBC sent  - Insert Hep lock  - GBS unkown  - Ancef 2g IV OCTOR  - SCDs OCTOR  - H/o cocaine abuse: last urine CDS negative. Will obtain repeat urine CDS now.    Laqueta Carina, MD  Ob/Gyn Resident Physician  Pager: [phone]    D/w Dr. Bing Neighbors    Patient seen and records reviewed, agree with above. Discussed options of VBAC versus repeat C/S, including surgical risks including bleeding, wound infections or other wound problems, iatrogenic damage to bowel and bladder and bleeding or transfusion.  Patient understands and consents.  Will proceed.    Gunnar Fusi MD  Attending, Maternal-Fetal Medicine

## 2009-11-26 NOTE — Anesthesia Post-procedure Eval (Signed)
Anesthesia Post-op Note    Patient: Jenna [name]    Procedure(s) Performed:  Cesarean delivery    Anesthesia type: Spinal Block    Patient location: PACU    Mental Status: Recovered to baseline    Patient able to participate in this evaluation: yes  Last Vitals: BP 116/70  Pulse 62  Temp(Src) 36.3 C (97.3 F) (Temporal)  Resp 18  Ht 1.651 m (5\' 5" )  SpO2 98%  Breastfeeding? Unknown     Post-op vital signs noted above are within patient's normal range  Post-op vitals signs: stable  Respiratory function: baseline    Airway patent: Yes    Cardiovascular and hydration status stable: Yes    Post-Op pain: Adequate analgesia    Post-Op assessment: no apparent anesthetic complications    Block in process of resolution. Patient is appreciative of anesthesia care.    Complications: none    Attending Attestation: All indicated post anesthesia care provided    Author: Malena Catholic, MD,PHD  as of: 11/26/2009  at: 12:39 PM

## 2009-11-26 NOTE — INTERIM OP NOTE (Addendum)
Interim Operative Report    Date of Surgery: 11/26/2009  Surgeon: Dr. Bing Neighbors   First Assistant: E. Fountaine     Pre-Op Diagnosis: Scheduled repeat LTCS     Anesthesia Type: Spinal    Post-Op Diagnosis    Primary: same  Secondary: viable female infant, apgars 8/8, 3549gms     Additional Findings (Including unexpected complications): Normal tubes and ovaries bilaterally.   Fascial adhesions to omentum and anterior abdominal wall. Moderate.     Procedure(s) Performed (including CPT 4 Code if available)   Repeat LTCS     Estimated Blood Loss: 700cc    Packing: No  Drains: Yes, Type foley, # 1. Removed? NO, plan for removal is POD#1  Fluid Totals: Intakes: 5400cc IV crystalloids  Fluid Outputs: 125cc clear yellow urine into foley   Specimens to Pathology: no  Patient Condition: good    Scheduled repeat LTCS. Viable female infant delivered in LOA presentation. Nuchalx1  Clear fluid. Gases obtained and sent. Placenta intact - discarded.   Single layer uterine closure with 2 additional figure of 8 suture in mid-hysterotomy region for hemostasis. Vicryl on fascia. 3 interrupted suture in sub-Q for reapproximation. Staples on skin.   Tegaderm dressing.     Dictation#[id number]    Edd Arbour, MD   OBGYN Resident   Pager# [phone]       Edd Arbour, MD as of 11:30 AM, 11/26/2009

## 2009-11-26 NOTE — Discharge Summary (Signed)
Discharge Summary       Admit date: 11/26/2009         Discharge date and time: 9/17/22011  Admitting Physician: [name] Rower, MD   Discharge Attending: Dr. Bing Neighbors     Patient: Jenna [name] Age: 24 y.o. Date of Birth: 1985-05-10 [id number]    Chief Complaint: Scheduled repeat c-section  Principle Problem:  [redacted]w[redacted]d intrauterine pregnancy admitted for scheduled repeat c/s    Details of Admission: as per admission H&P    Discharge Diagnoses:  Active Hospital Problems   Diagnoses   . Cocaine abuse complicating pregnancy [648.40V]      Resolved Hospital Problems   Diagnoses       Hospital Course (including key diagnostic test results):    This is a 24yo [id number] at 39w0/7d admitted for a scheduled repeat LTCS. She has a history of a prior LTCS for NRFHT and desired a repeat procedure. Counseled on risks and benefits of TOLAC versus repeat c-section and patient chose to proceed with c-section.     Please see operative report for full details of procedure. Patient had an uncomplicated repeat c-section with delivery of a 3549gram female infant with apgars of 8/8/9. She tolerated the procedure well as did the infant.     Ms. Vedder was meeting all necessary post partum and post operative milestones and is being discharged to home in stable condition. She received TDaP prior to discharged. She is bottle feeding and using Ortho Evra as a bridge to a mirena IUD for birth control. [facility] will [facility] weeks for a post partum check and mirena IUD placement.     Key Exam Findings at Discharge:    Vitals: Blood pressure 150/78, pulse 68, temperature 36.8 C (98.2 F), temperature source Temporal, resp. rate 16, height 1.651 m (5\' 5" ), SpO2 99.00%, unknown if currently breastfeeding.    Admission Weight:    Discharge Weight:         Pending Test Results: None     Consulting Providers: none    Discharged Condition: good    Discharge medications, instructions, and follow-up plans: as per After Visit  Summary    Disposition: Home with no services    Edd Arbour, MD   OBGYN Resident   Pager# [phone]         Signed: Edd Arbour, MD  On: 11/26/2009  at: 2:23 PM

## 2009-11-26 NOTE — Op Note (Signed)
SURGEON:  [name] Rower, MD  CO-SURGEON:  ASSISTANT:  Edd Arbour, MD,RES  SURGERY DATE:  11/26/2009    PREOPERATIVE DIAGNOSIS:   1. Full term intrauterine pregnancy at 39 weeks  and 0 days with a history of a prior cesarean section.        2. Desires repeat cesarean section.    POSTOPERATIVE DIAGNOSIS:  1. Full term intrauterine pregnancy at 39 weeks  and 0 days with a history of a prior cesarean section.        2. Desires repeat cesarean section.        3. Viable female infant with Apgars of 8 and 8, 3549 gm.    OPERATIVE PROCEDURE:      Repeat low transverse cesarean section.    ANESTHESIA: Spinal.    ANTIBIOTICS: 2 gm of Ancef prior to skin incision.    ESTIMATED BLOOD LOSS:     700 cc.    URINE OUTPUT:       125 cc of clear yellow urine into a Foley catheter.    INTRAOPERATIVE FLUIDS:    5400 cc of IV crystalloid.    INDICATIONS FOR THE PROCEDURE:   This patient is a 24 year old African  American female, gravida 5, para 3-0-1-3, with a 39 weeks, 0/7 day  estimated gestational age intrauterine pregnancy who presented to labor and  delivery for a scheduled repeat low transverse cesarean section.  The  patient had had a primary low transverse cesarean section in the past for a  nonreassuring fetal heart tracing and desired a repeat procedure.    OPERATIVE FINDINGS:  The patient was noted to have normal uterus, fallopian  tubes and ovaries bilaterally.  A viable female infant weighing 3549 gm  with Apgars of 8 and 8, placenta intact with a 3-vessel cord.    DESCRIPTION OF PROCEDURE:              The patient was taken to the  operating room and placed on the operating room table.  A stop check was  performed confirming the patient and the procedure.  Fetal heart tones were  noted to be in the 130s.  The patient was then sat up on the left side of  the operating table and received a spinal for anesthesia.  The patient was  subsequently placed in the left lateral tilt position.  The patient's  abdomen was  prepped and draped in the usual sterile fashion.  A low  transverse Pfannenstiel skin incision was made using the prior scar and was  carried down to the fascia sharply.  The fascia was then nicked in the  midline and the incision was extended transversely.  The fascia was then  carefully dissected off the rectus muscle using sharp and blunt dissection.  The rectus muscle was then divided along the midline and the peritoneum was  carefully isolated and incised.  The incision was extended superiorly and  inferiorly with good visualization of the bladder.  Of note, the patient  had significant adhesions of the omentum as well as the anterior abdominal  wall.  Those adhesions were taken down.  Once small section of omentum was  separated using Bovie cautery and both ends of the omentum which were split  were noted to be hemostatic.  The bladder blade was then placed and a  bladder flap was created.  A low transverse uterine incision was made and  extended superiorly and inferiorly.  Clear fluid was noted upon entry into  the uterus.  There was delivery of a viable female infant in the left  occiput anterior position with a nuchal cord x 1.  The infant's mouth and  nares were bulb-suctioned on the abdomen.  The infant's cord was clamped,  cut and the infant was passed to awaiting personnel.  Cord gases were  obtained and sent to the lab.  Cord bloods were subsequently obtained.  The  placenta was then delivered manually and was noted to have an anterior  insertion point.  The uterus was then exteriorized and cleared of all clot  and debris.  The uterine incision was reapproximated with an interlocking  suture of 0 Vicryl.  Two small figure-of-eight sutures were then placed  across the center region of the hysterotomy site and good hemostasis was  subsequently reassured.  The uterus was placed back into the abdominal  cavity and was inspected with good hemostasis noted.  Both angles were  visualized, once again inspected  and good hemostasis was noted.  The  abdominal cavity was once again inspected for any bleeding.  The portion of  the omentum which had been split and Bovie coagulated was inspected and  again good hemostasis was noted.  The rectus sheath was inspected as well  as the sidewalls of the peritoneum and once again good hemostasis was noted  throughout.  A wound sweep was performed noting no retained instruments or  sponges.  Attention was then turned to the fascia which was reapproximated  with a running suture of 0 Vicryl.  The subcutaneous layer was noted to be  hemostatic.  The subcutaneous layer was then reapproximated with 3  interrupted sutures of Monocryl, 1 at the left lateral aspect of the  incision, 1 in the midline and 1 in the right lower aspect of the incision.  The skin was then subsequently reapproximated with staples.  A sterile  dressing was then applied and a Tegaderm was placed over the dressing.  The  patient tolerated the procedure well and was taken to the postanesthesia  care unit in stable and satisfactory condition.  All sponge, needle, and  instrument counts were correct at the end of the procedure.  The Foley  catheter was draining clear yellow urine at the end of the procedure.    Dictated by:  Edd Arbour, MD,RES    I was present throughout the entire procedure.    Electronically Signed and Finalized  by  [name] Rower, MD 12/03/2009 21:59  _____________________________________________  [name] Rower, MD      DD:   11/26/2009  DT:   11/26/2009  1:32 P  DVI:  [id number]  JX/[id number]#[id number]    cc:   [name] Rower, MD

## 2009-11-26 NOTE — Progress Notes (Signed)
Post-Operative Note:     S: Doing well, no acute issues. Pain controlled w/ prns. Tolerating PO - ate a large dinner! Foley in place to gravity draining clear yellow urine. Adequate UOP. No flatus yet. Lochia mild-moderate. Ambulating without issue. Incision c/d/i.     Baby girl "Journey" doing well. Bottle feeding. ppBC: OrthoEvra -> mIUD.     O: BP: (90-150)/(55-78)   Temp:  [36.2 C (97.2 F)-36.8 C (98.2 F)]   Temp src:  [-]   Heart Rate:  [60-81]   Resp:  [16-18]   SpO2:  [98 %-100 %]   Height:  [165.1 cm (5\' 5" )]   Gen: AAOx3 in NAD  CV: RRR, No murmurs  Pulm: CTAB, No wheezes or crackles. Good breath sounds bl.   Abd: Obese, Soft, ND, Appropriately tender to palpation, +BS, No rebound/guarding, No masses    Fundus firm at U-1.    Incision C/D/I. Staples in place. No erythema.   LE: Calves NT, No edema bl.     No results found for this or any previous visit (from the past 24 hour(s)).      A/P:  24 y.o. African American female [phone] s/p scheduled repeat LTCS, Post-Operation Day # 0  - Doing well.     1) Rh + / Rubella immune / GBS neg   2) Female Gender Infant - "Journey"   3) Bottle Feeding    4) ppBC: OrthoEvra -> mIUD  5) Pain controlled with prns   6) Incision: c/d/i with staples in place.   7) Foley out POD#1  8) CBC POD#1 (Admit Hct 33)   9) TDaP Ordered.   10) UCDS on admit = negative   11) Social Work consult ordered.   12) Anticipate Discharge to Home on POD#3 with attending approval     Edd Arbour, MD   OBGYN Resident   Pager# [phone]

## 2009-11-26 NOTE — Progress Notes (Signed)
Patient transferred to 05-3620 for post partum care in stable condition, baby in arms    Tyler Aas, RN

## 2009-11-26 NOTE — Progress Notes (Addendum)
Report to [facility] RN, patient for transfer to 05-1620    Tyler Aas, RN

## 2009-11-26 NOTE — Progress Notes (Signed)
Pt encouraged to increase PO fluids. Cranberry juice pitcher given to patient at this time.

## 2009-11-26 NOTE — Progress Notes (Signed)
Patient arrived with jail personal ambulatory to 05-1398 for C/S.  Patient oriented to room and call bell.  Released from [place] upon admission.  Patient stating she is free from jail and will be going home with baby and a family member.  This plan different from what is noted in prenatal.  Will check with social work.    Tyler Aas, RN

## 2009-11-26 NOTE — Progress Notes (Signed)
Patient arrived to PACU via stretcher at this time, alert and oriented x 3, VS stable, baby in arms.  Patient hooked to monitors, IV patent, foley patent.  Report received from anesthesia.    Ancef 2g IV @ 1011  Duramorph 0.98mcg @ 0957  Zofran 4mg  IV @ [address], Charity fundraiser

## 2009-11-26 NOTE — Discharge Instructions (Signed)
Written instructions provided: [facility] to [facility]    Call your OB provider promptly if you experience any of the symptoms in the pre-written guidelines. If you cannot reach your MD/CNM, call their answering service or 9-1-1.    Diet: per pre-written guidelines.    Activity:  Per pre-written guidelines.    Medical Equipment / Supplies  None    Community Services  None

## 2009-11-27 LAB — CBC
Hematocrit: 27 % — ABNORMAL LOW (ref 34–45)
Hemoglobin: 8.8 g/dL — ABNORMAL LOW (ref 11.2–15.7)
MCV: 94 fL (ref 79–95)
Platelets: 146 THOU/uL — ABNORMAL LOW (ref 160–370)
RBC: 2.8 MIL/uL — ABNORMAL LOW (ref 3.9–5.2)
RDW: 15.1 % — ABNORMAL HIGH (ref 11.7–14.4)
WBC: 13.8 THOU/uL — ABNORMAL HIGH (ref 4.0–10.0)

## 2009-11-27 MED ORDER — OXYCODONE HCL 5 MG PO TABS *I*
5.0000 mg | ORAL_TABLET | ORAL | Status: DC | PRN
Start: 2009-11-27 — End: 2009-11-29
  Administered 2009-11-27 – 2009-11-28 (×3): 5 mg via ORAL
  Filled 2009-11-27 (×3): qty 1

## 2009-11-27 MED ORDER — MEDROXYPROGESTERONE ACETATE 150 MG/ML IM SUSP *I*
150.0000 mg | Freq: Once | INTRAMUSCULAR | Status: AC
Start: 2009-11-28 — End: 2009-11-28
  Administered 2009-11-28: 150 mg via INTRAMUSCULAR
  Filled 2009-11-27: qty 1

## 2009-11-27 MED ORDER — FERROUS SULFATE 325 (65 FE) MG PO TABS *WRAPPED* *I*
325.0000 mg | ORAL_TABLET | Freq: Two times a day (BID) | ORAL | Status: DC
Start: 2009-11-27 — End: 2009-11-29
  Administered 2009-11-27 – 2009-11-29 (×5): 325 mg via ORAL
  Filled 2009-11-27 (×5): qty 1

## 2009-11-27 MED FILL — Morphine Sulfate Inj PF 1 MG/ML: INTRAMUSCULAR | Qty: 10 | Status: AC

## 2009-11-27 NOTE — Progress Notes (Signed)
loder md notified pt sobing in pain. Not due for pain meds at this time. Last given 2 percocet at 1247 and motrin last given at 1259. Plan is for loder to assess pt and eval.

## 2009-11-27 NOTE — Progress Notes (Addendum)
OB Postpartum Progress Note:     Postpartum Day: 1    S:  Patient without complaints. Tolerating regular diet. Had two cheeseburgers with Provolone for dinner.  Denies chest pain, shortness of breath, nausea and vomiting.  Pain is well controlled. Ambulating without difficulty.  Moderate lochia.  Flatus: present    O: Filed Vitals:    11/27/09 0430   BP: 124/48   Pulse: 79   Temp: 36.6 C (97.9 F)   Resp: 18         Range:   BP: (90-150)/(48-78)   Temp:  [36.2 C (97.2 F)-36.9 C (98.4 F)]   Temp src:  [-]   Heart Rate:  [60-96]   Resp:  [16-18]   SpO2:  [98 %-100 %]   Height:  [165.1 cm (5\' 5" )]      I/Os:  I/O last 3 completed shifts:  In: 7081.7 [P.O.:910; I.V.:6171.7]  Out: 2275 [Urine:1575; Blood:700]       Physical Exam:  HEENT: Normocephalic; atraumatic  Cardiovascular: Regular rate and rhythm with no murmurs  Respiratory: Clear to auscultate  Abdomen: Minimal BS, S/NT/ND  Neurological: Not assessed  Extremities/Skin: NT  Fundus:  Fundus firm at umbilicus -1  Incision: Clean, dry and intact    Labs:    Lab 11/27/09 0610 11/25/09 0941   HCT 27* 33*         A/P:  24 y.o. [id number] with h/o preeclampsia, crack cocaine use and incarceration this pregnancy POD# 1 s/p rLTCS.  Doing well.  1) Continue routine postpartum care  2) Increase ambulation.  SCDs for DVT PPx.  3) Infant gender: female, "Journey"  4) Feeding type: bottle  5) PPBC: Previously wanted patch to M. IUD.  Counseled about increased risk of blood clot in immediate post-partum period and avoiding estrogen.  She didn't like DMPA because it made her "fat" but she is willing to do DMPA once and then get her IUD.  6) Rh: positive  7) Rubella:immune  8) CPS/SW involved for h/o crack cocaine and recent incarceration.  9) Acute blood loss anemia: FeSO4 as outpt  10) Dispo: Likely d/c home POD #3    Baldo Daub, MD   Ob/Gyn Resident  Pager [phone]    Patient seen.  Abdomen soft with no guarding or rebound.  Inc c/d/i.  Passing flatus and eating without nausea or  emesis.  Will schedule pain meds and add abdominal binder for hopefully improved pain control.  Otherwise doing well.    Gunnar Fusi MD  Attending, Maternal-Fetal Medicine

## 2009-11-27 NOTE — Anesthesia Post-procedure Eval (Signed)
Anesthesia Post-op Note    Patient: Jenna [name]    Procedure(s) Performed: Caesarian section (see surgical note)    Anesthesia type: Spinal Block    Patient location: Labor and Delivery    Mental Status: Recovered to baseline    Patient able to participate in this evaluation: yes  Last Vitals: BP 124/48  Pulse 79  Temp(Src) 36.6 C (97.9 F) (Temporal)  Resp 18  Ht 1.651 m (5\' 5" )  SpO2 99%  Breastfeeding? Unknown     Post-op vital signs noted above are within patient's normal range  Post-op vitals signs: stable  Respiratory function: baseline    Airway patent: Yes    Cardiovascular and hydration status stable: Yes    Post-Op pain: Adequate analgesia    Post-Op assessment: no apparent anesthetic complications and tolerated procedure well    Complications: none    Attending Attestation: All indicated post anesthesia care provided    Author: Sue Lush, MD  as of: 11/27/2009  at: 9:58 AM

## 2009-11-28 ENCOUNTER — Ambulatory Visit: Payer: Self-pay | Admitting: Surgery

## 2009-11-28 MED ORDER — MAGNESIUM HYDROXIDE 400 MG/5ML PO SUSP *I*
30.0000 mL | Freq: Every day | ORAL | Status: DC | PRN
Start: 2009-11-28 — End: 2009-11-29
  Administered 2009-11-28: 30 mL via ORAL

## 2009-11-28 NOTE — Progress Notes (Signed)
Social Work Note:  Engineer, water [phone]) contacted Clinical research associate to state that CPS has okayed pt's baby be d/c to [employer] of [address] [phone]). CPS reports that Ms. Leatherman will also be d/c to Ms. Halmond and will be residing there until Ms. Fowble returns to jail.   Plan:  SW to contact Ms. Halmond when baby is ready for d/c   SW to complete Alternate D/C form  CPS to f/u with pt after d/c  Maricela Curet, MSW  [phone], [phone]

## 2009-11-28 NOTE — Progress Notes (Addendum)
OB Postpartum Progress Note:     Postpartum Day: 2    S:  Having pain.  Finds that the most pain is when she needs to pass flatus but can't.  She is able to pass flatus at some times though.  Having cramping. Tolerating regular diet. Had two breakfasts yesterday.  Denies chest pain, shortness of breath, nausea and vomiting. Ambulating and voiding without difficulty.  Moderate lochia.      O: Filed Vitals:    11/28/09 0408   BP: 132/70   Pulse: 76   Temp: 36.1 C (97 F)   Resp: 16         Range:   BP: (115-140)/(52-70)   Temp:  [36 C (96.8 F)-36.9 C (98.4 F)]   Temp src:  [-]   Heart Rate:  [76-86]   Resp:  [14-20]      I/Os:  I/O last 3 completed shifts:  In: 590 [P.O.:590]  Out: 475 [Urine:475]       Physical Exam:  HEENT: Normocephalic; atraumatic  Cardiovascular: Regular rate and rhythm with no murmurs  Respiratory: Clear to auscultate  Abdomen: Minimal BS, S/NT/ND, binder on  Extremities/Skin: NT  Fundus:  Fundus firm at U  Incision: Clean, dry and intact    Labs:    Lab 11/27/09 0610 11/25/09 0941   HCT 27* 33*         A/P:  24 y.o. [id number] with h/o preeclampsia, crack cocaine use and incarceration this pregnancy POD# 2 s/p rLTCS.  Doing well.  1) Will schedule Motrin with Percocet and Oxy IR prn.  Milk of Mag.  2) Ambulation.  SCDs for DVT PPx.  3) Infant gender: female, "Journey"  4) Feeding type: bottle  5) PPBC: DMPA to IUD  6) Rh: positive  7) Rubella:immune  8) CPS/SW involved for h/o crack cocaine and recent incarceration.  9) Acute blood loss anemia: FeSO4 as outpt  10) Dispo: Likely d/c home POD #3    Baldo Daub, MD   Ob/Gyn Resident  Pager [phone]    Patient seen and records reviewed, agree with above. Patient reports improved pain control.  Abd soft.  Continue current care.    Gunnar Fusi MD  Attending, Maternal-Fetal Medicine

## 2009-11-28 NOTE — Progress Notes (Signed)
Encouraged pt to increase ambulation today.

## 2009-11-29 MED ORDER — DOCUSATE SODIUM 100 MG PO CAPS *I*
100.0000 mg | ORAL_CAPSULE | Freq: Two times a day (BID) | ORAL | Status: DC
Start: 2009-11-29 — End: 2009-11-29

## 2009-11-29 MED ORDER — OXYCODONE-ACETAMINOPHEN 5-325 MG PO TABS *I*
2.0000 | ORAL_TABLET | ORAL | Status: DC | PRN
Start: 2009-11-29 — End: 2009-11-29

## 2009-11-29 MED ORDER — DOCUSATE SODIUM 100 MG PO CAPS *I*
100.0000 mg | ORAL_CAPSULE | Freq: Two times a day (BID) | ORAL | Status: DC
Start: 2009-11-29 — End: 2012-03-27

## 2009-11-29 MED ORDER — IBUPROFEN 600 MG PO TABS *I*
600.0000 mg | ORAL_TABLET | Freq: Four times a day (QID) | ORAL | Status: DC | PRN
Start: 2009-11-29 — End: 2009-11-29

## 2009-11-29 MED ORDER — OXYCODONE-ACETAMINOPHEN 5-325 MG PO TABS *I*
2.0000 | ORAL_TABLET | ORAL | Status: AC | PRN
Start: 2009-11-29 — End: 2009-12-09

## 2009-11-29 MED ORDER — IBUPROFEN 600 MG PO TABS *I*
600.0000 mg | ORAL_TABLET | Freq: Four times a day (QID) | ORAL | Status: AC | PRN
Start: 2009-11-29 — End: 2009-12-09

## 2009-11-29 MED ORDER — FLEET ENEMA 7-19 GM/118ML RE ENEM *I*
1.0000 | ENEMA | Freq: Once | RECTAL | Status: AC | PRN
Start: 2009-11-29 — End: 2009-11-29
  Administered 2009-11-29: 1 via RECTAL

## 2009-11-29 MED ORDER — FERROUS SULFATE 325 (65 FE) MG PO TABS *WRAPPED* *I*
325.0000 mg | ORAL_TABLET | Freq: Two times a day (BID) | ORAL | Status: AC
Start: 2009-11-29 — End: 2010-11-29

## 2009-11-29 MED ORDER — MAGNESIUM HYDROXIDE 400 MG/5ML PO SUSP *I*
30.0000 mL | Freq: Every day | ORAL | Status: AC | PRN
Start: 2009-11-29 — End: 2009-12-09

## 2009-11-29 MED ORDER — FERROUS SULFATE 325 (65 FE) MG PO TABS *WRAPPED* *I*
325.0000 mg | ORAL_TABLET | Freq: Two times a day (BID) | ORAL | Status: DC
Start: 2009-11-29 — End: 2009-11-29

## 2009-11-29 NOTE — Progress Notes (Addendum)
SOCIAL WORK NOTE    SW reviewed chart & spoke with Jeni Salles, Ob SW this am. Per Selena Batten alternate d/c form des not need to be signed at time of d/c. Pt & baby are going to be d/c'd together and they are both going to Aunt's home. CPS to follow-up. No further intervention by SW anticipated at this time.    Mickle Asper, LMSW  Weekend Ob/Peds SW   Pager [phone]       Sw called by nursing to assist Pt with getting prescriptions filled upon d/c. SW completed & tubed Pharmacy Voucher to Pharmacist, Lamarr Lulas. Will obtain funding from [employer]. No other needs identified at this time.     Mickle Asper, LMSW

## 2009-11-29 NOTE — Progress Notes (Addendum)
(  Late Entry note from 11/28/09 20:30)  R1 OB Discharge Rounding Note:    Postpartum day: 2      S:  Patient states she is doing well.  Pain is well controlled. Ambulating, voiding and tolerating PO without difficulty.  Minimal to moderate lochia.  Flatus: present    O: Filed Vitals:    11/29/09 0230   BP:    Pulse:    Temp:    Resp: 18         Range:   BP: (111-130)/(53-60)   Temp:  [36.4 C (97.5 F)-36.9 C (98.4 F)]   Temp src:  [-]   Heart Rate:  [78-81]   Resp:  [18]      Physical Exam:  HEENT: Normocephalic; atraumatic  Cardiovascular: Regular rate and rhythm with no murmurs  Respiratory: Clear to auscultate  Abdomen: +BS, soft, nondistended   Extremities/Skin: No edema noted  Fundus: Fundus firm at umbilicus  Incision: Clean, dry and intact and Not assessed    Labs:    Lab 11/27/09 0610 11/25/09 0941   HCT 27* 33*         A/P:  24 y.o. [id number] with h/o preeclampsia, crack cocaine use and incarceration this pregnancy PPD# 2 s/p rLTCS .  Doing well.  1) Continue routine postpartum care  2) Increase ambulation  3) Rh: positive  4) Rubella:immune  5) Dispo: Likely d/c home in AM per attending approval.  6) Infant gender: female  7) Feeding type: bottle  8) PPBC:DMPA to IUD  9) Hct 27 down from 33, d/c home with FeSO4  10) Pain controlled on Ibuprofen, Percocet, and Oxy IR - Will d/c home with Ibuprofen and Percocet.   11) CPS/SW involved for h/o crack cocaine and recent incarceration.    Delynn Flavin  Ob/Gyn R1   [id number]  11/29/2009  5:21 AM    OB attending:  I saw and evaluated the patient. I agree with the resident's findings and plan of care as documented above. Details of my evaluation are as follows: [id number] PPD #2 s/p RLTCS. Doing well, desires discharge. Patient does not want her staples removed today and wants to come back tomorrow or on Monday. Agree with discharge home.    Delanna Notice, DO

## 2009-12-02 ENCOUNTER — Ambulatory Visit: Payer: Self-pay | Admitting: Obstetrics and Gynecology

## 2010-01-06 ENCOUNTER — Ambulatory Visit: Payer: Self-pay

## 2010-03-24 NOTE — Miscellaneous (Unsigned)
Continuity of Care Record  Created: todo  From: ,   From:   From: [employer], EHR v10.2.7.53  To: Jenna [name]  Purpose: Patient Use;       Problems  Diagnosis: Closed Avulsion Fracture Of The Fifth Right Metatarsal; Right   (825.25)   Problem: Currently Pregnant  Diagnosis: Allergic Rhinitis 21 Sep 1995 (477.9)   Diagnosis: Obesity 19 Jul 1997 (278.00)   Diagnosis: Acne 16 Jan 2000 (706.1)   Diagnosis: Migraine Headache 23 Feb 2001 (346.90)     Family History  Family history of Diabetes Mellitus (V18.0)   Family history of Hypertension (V17.49)     Social History  History of Alcohol Resolved  Smoking Cigarettes (V15.82)   History of Using Marijuana Resolved    Alerts  Allergy - Latex-asked/denied   Allergy - No Known Drug Allergy     Medications  Colace 100 MG Capsule; TAKE 1 CAPSULE TWICE DAILY. ; Rx   Flintstones Plus Iron Tablet Chewable; CHEW AND SWALLOW 1 TABLET DAILY. ;   Rx   Ibuprofen 400 MG Tablet; TAKE 1 TABLET EVERY 6 HOURS AS NEEDED. ; Rx   Oxycodone-Acetaminophen 5-325 MG Tablet; TAKE 1 TABLET EVERY 4 TO 6 HOURS   AS NEEDED FOR PAIN. ; Rx

## 2010-06-02 DIAGNOSIS — A64 Unspecified sexually transmitted disease: Secondary | ICD-10-CM | POA: Insufficient documentation

## 2010-11-02 NOTE — Telephone Encounter (Signed)
 Error

## 2011-03-24 ENCOUNTER — Telehealth: Payer: Self-pay

## 2011-03-31 ENCOUNTER — Encounter: Payer: Self-pay | Admitting: Obstetrics and Gynecology

## 2011-03-31 ENCOUNTER — Ambulatory Visit: Payer: Self-pay | Admitting: Obstetrics and Gynecology

## 2011-03-31 VITALS — BP 136/88 | Ht 65.0 in | Wt 257.0 lb

## 2011-03-31 DIAGNOSIS — N6452 Nipple discharge: Secondary | ICD-10-CM

## 2011-03-31 DIAGNOSIS — D649 Anemia, unspecified: Secondary | ICD-10-CM

## 2011-03-31 LAB — POCT URINE PREGNANCY: Lot #: 708187

## 2011-03-31 LAB — CBC
Hematocrit: 33 % — ABNORMAL LOW (ref 34–45)
Hemoglobin: 10.1 g/dL — ABNORMAL LOW (ref 11.2–15.7)
MCV: 91 fL (ref 79–95)
Platelets: 349 10*3/uL (ref 160–370)
RBC: 3.6 MIL/uL — ABNORMAL LOW (ref 3.9–5.2)
RDW: 14.8 % — ABNORMAL HIGH (ref 11.7–14.4)
WBC: 7.8 10*3/uL (ref 4.0–10.0)

## 2011-09-06 ENCOUNTER — Ambulatory Visit: Payer: Self-pay | Admitting: Psychiatry

## 2011-09-13 ENCOUNTER — Ambulatory Visit: Payer: Self-pay | Admitting: Psychiatry

## 2012-03-15 NOTE — L&D Delivery Note (Addendum)
Delivery Summary Note  Patient: Jenna [name]  Age: 27 y.o.  Date of Birth: [birth year]  [id number]  MRN: [id number]    Admission Summary  Date and time of admission: 10/27/2012  9:59 AM Attending Provider: Brayton El, MD Provider Group : [facility]  Active Hospital Problems    Diagnosis   . *!*S/P cesarean section   . H/O cesarean section     Allergies:   Review of patient's allergies indicates no known allergies (drug, envir, food or latex).  Weight:          Obstetric History    G6   P5   T5   P0   A1   TAB1   SAB0   E0   M0   L4       Breast or Formula Feeding: Formula feeding      Prenatal Labs  ABO RH Blood Type   Date Value Range Status   10/25/2012 A RH POS   Final        Antibody Screen   Date Value Range Status   10/25/2012 Negative   Final        Rubella IgG AB   Date Value Range Status   03/27/2012 POSITIVE   Final      TEST METHOD: EIA        RPR Screen   Date Value Range Status   11/04/2009 NONREACT  NONREACT Final      TEST METHOD: Charcoal Particle Agglutination        HBV S Ag   Date Value Range Status   03/27/2012 NEG   Final        Group B Strep Culture   Date Value Range Status   10/10/2012 Streptococcus agalactiae (Group B) detected   Final      Organism identified from broth culture by amplification.        HIV 1&2 ANTIGEN/ANTIBODY   Date Value Range Status   09/26/2012 Nonreactive   Final      Test Method: CMIA        HIV 1&2 Ab screen   Date Value Range Status   11/04/2009 NEG   Final      TEST METHOD: EIA      Dating Information  Patient's last menstrual period was 01/07/2012. EDD: 11/03/2012, by Ultrasound  Information for the patient's newborn:  Christye, Vierling [[id number]]     Delivery Information  Boy Giddings  Sex: female Gestational Age: [redacted]w[redacted]d MRN: [id number] PCP: Juanito Doom, MD   Delivery Date/Time: 10/27/2012 1:17 PM   Time of Head Delivery: 10/27/2012  1:15 PM  Delivery Type: C-Sec, Low Transverse       Delivery Location: 314 OR      Labor Onset Date/Time:   Dilation Complete Date/Time:      Preterm labor:   Antenatal steroids:   Antibiotics received during labor:   First Cervical ripening date/time:   /   Cervical ripening Type:          Rupture Date: 10/27/2012 Rupture Time: 1:11 PM  Details:   Rupture Type: Artificial Color: Non Particulate Meconium Amount: Moderate  Induction: None   Indications:   Augmentation:   Labor complications: None     Delivering clinician:  Lonny Prude    Other personnel:      Provider Kathrine Cords, ETHAN Resident   Arnoldo Morale Registered Nurse   Danie Binder NICU/SCN NP   Darryl Lent, ERICA L NICU/SCN Resident   Burke Keels  NICU/SCN RN         Anesthesia Method: General-   Analgesics:       Presentation: Breech Position:                                                           Resuscitation: Tactile Stimulation;Bulb Suctioning;Oxygen;PPV;Tracheal Suctioning;Intubation  Living Status: Yes           APGARs Total Color Reflex irritability Breath Heart Rate Muscle Tone Assigned By   (greater than 7 no need for next measurement)   1 min 4  0  1  0  2  1      5  min 9  1  2  2  2  2      10  min           15 min           20 min           25 min                 30 min                   Birth Weight:  Height: 20.08" Head Circumference: 36 cm Observed Anomalies:     Cord: 3 Vessels     Complications:        Cord blood disposition: Lab     Gases sent: Yes       Stem cell collection -by MD-: No  Maternal Info:   Placenta Delivery Date/Time: 8/15  1:20 PM     Removal: C-Section Removal     Appearance: Intact     Disposition: pathology  Bonding:      Skin To Skin: No   Stages of Labor:          Stage One:  h  m          Stage Two:  h  m          Stage Three:  0h  46m  Episiotomy:            Perineal lacerations:                                Delivery est. blood loss (mL): 800.00       Needle Count: Correct        Sponge Count: Correct  Procedures:        Patient underwent repeat LTCS at 39wks for ho LTCSx2.  Failed vacuum-assistance during cesarean section.  Baby was converted to  breech presentation and delivered successfully breech.  Weight not yet recorded.  APGARs 4/9.   Mother and baby doing well.     See operative note for further details.      Harlin Heys, MD  Ob-Gyn PGY-2  Pager: [phone]    I was present throughout the entire procedure.    Lonny Prude, MD

## 2012-03-23 ENCOUNTER — Telehealth: Payer: Self-pay

## 2012-03-23 NOTE — Telephone Encounter (Signed)
Spoke with [facility] the jail, patients lmp ?01/07/12 w/+blood pregnancy on 03/10/12    Patient has a history of elevated bp in pregnancy (last 2011/scc)    Patient is scheduled to be released around 04/11/12 which is the next available NPI    They would like patient to be seen at least once before she is released to make sure everything is ok    Please call Baxter Hire at jail

## 2012-03-23 NOTE — Telephone Encounter (Signed)
Patient with early pregnancy, to be released from [facility] last week in January. Has h/o cocaine abuse and hypertension in last pregnancy and was seen in [facility] in 2011. Spoke with Baxter Hire at [facility] unable to disclose reason for incarceration. Scheduled for NPI/NOB/SW on 03/27/12. Will send copy of confirmation of pregnancy with paper work. Encouraged to arrive by 9:30am in order to start appointments on time. Agrees with plan as as scheduled.

## 2012-03-23 NOTE — Telephone Encounter (Signed)
Baxter Hire called on behalf of patient to schedule an appointment for her. Please contact her at [phone] or [phone].

## 2012-03-27 ENCOUNTER — Ambulatory Visit: Payer: Self-pay

## 2012-03-27 ENCOUNTER — Encounter: Payer: Self-pay | Admitting: Student in an Organized Health Care Education/Training Program

## 2012-03-27 ENCOUNTER — Other Ambulatory Visit: Payer: Self-pay | Admitting: Student in an Organized Health Care Education/Training Program

## 2012-03-27 ENCOUNTER — Ambulatory Visit: Payer: Self-pay | Admitting: Student in an Organized Health Care Education/Training Program

## 2012-03-27 VITALS — BP 136/72 | Ht 64.0 in | Wt 268.0 lb

## 2012-03-27 DIAGNOSIS — F1411 Cocaine abuse, in remission: Secondary | ICD-10-CM | POA: Insufficient documentation

## 2012-03-27 DIAGNOSIS — Z3480 Encounter for supervision of other normal pregnancy, unspecified trimester: Secondary | ICD-10-CM | POA: Insufficient documentation

## 2012-03-27 LAB — DRUG SCREEN CHEMICAL DEPENDENCY, URINE
Amphetamine,UR: NEGATIVE
Benzodiazepinen,UR: NEGATIVE
Cocaine/Metab,UR: NEGATIVE
Opiates,UR: NEGATIVE
THC Metabolite,UR: NEGATIVE

## 2012-03-27 LAB — POCT URINALYSIS DIPSTICK
Bilirubin,Ur: NEGATIVE
Blood,UA POCT: NEGATIVE
Glucose,UA POCT: NORMAL
Ketones,UA POCT: NEGATIVE
Leuk Esterase,UA POCT: 1 — AB
Lot #: 22041202
Nitrite,UA POCT: NEGATIVE
PH,UA POCT: 6 (ref 5–8)
Specific gravity,UA POCT: 1.02 (ref 1.002–1.03)
Urobilinogen,UA: NORMAL

## 2012-03-27 LAB — TYPE AND SCREEN
ABO RH Blood Type: A POS
Antibody Screen: NEGATIVE

## 2012-03-27 LAB — GLUCOSE TOLERANCE, 1 HOUR: Glucose,50gm 1HR: 93 mg/dL (ref 63–135)

## 2012-03-27 MED ORDER — ONDANSETRON 4 MG PO TBDP *I*
4.0000 mg | ORAL_TABLET | Freq: Three times a day (TID) | ORAL | Status: DC | PRN
Start: 2012-03-27 — End: 2012-06-23
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
## 2012-03-28 ENCOUNTER — Telehealth: Payer: Self-pay

## 2012-03-28 LAB — HEMOGLOBIN ELECTROPHORESIS
Hgb A1: 97.6 % (ref 96.8–97.8)
Hgb A2: 2.4 % (ref 2.2–3.2)
Interp,HBE: NORMAL

## 2012-03-28 LAB — CHLAMYDIA PLASMID DNA AMPLIFICATION: Chlamydia Plasmid DNA Amplification: 0

## 2012-03-28 LAB — LEAD VENOUS: Lead,Venous: <1 ug/dl (ref 0–25)

## 2012-03-28 LAB — LEAD, BLOOD

## 2012-03-28 LAB — N. GONORRHOEAE DNA AMPLIFICATION: N. gonorrhoeae DNA Amplification: 0

## 2012-03-28 LAB — AEROBIC CULTURE: Aerobic Culture: 0

## 2012-03-28 NOTE — Progress Notes (Signed)
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  Pt is known to SW through her previous pregnancy - has a long h/o substance abuse and involvement with CPS.  Pt reports that her current incarceration is related to an outstanding warrant for petty larceny.  She was arrested shortly before Christmas and expects to be released by the end of January.  Pt is planning to go to [facility] after her release to apply for emergency housing, reports that her caseworker at jail has been assisting her with this plan.  Pt denies any illegal drug use for the past two years, also denies any current mental health concerns - though she has not been on any psychiatric meds (trazadone and seroquel) in over a year.  Pt's children are currently with family - two of her children are in the temporary custody of her aunt and her mother has full custody of one child.  She denies current involvement with CPS but acknowledges that CPS was involved with current childcare arrangements.  Only agency that pt reports she will be involved with after her release is [employer] (worker = Nira Conn).  She expresses interest in a perinatal support program.  Plan -   1.SW planning to meet with pt again after her release from jail (pt gave her aunt's - Berdie Ogren - information for contact purposes: [address], [place] [phone]), will discuss appropriate perinatal support program at that time.  [employer] vs. [facility]  2.would recommend drug screen at future OB visit after pt is released from jail  3.pt expressing concern about transportation to future visits.  Pt instructed to call [facility] after her release & request that SW send her a bus pass to her current address    Elissa Hefty, LMSW [phone]

## 2012-03-28 NOTE — Telephone Encounter (Signed)
Call from [place] from [facility] lab, pt had blood drawn for first trimester screen but does not have an ultrasound until 04/03/12.  Pt may not be as far along as is noted in erecord and we wont know until the ultrasound.  Blood work may have been drawn to early per [employer].  Per Derenda Mis NP cancel the blood work and have patient redrawn at the time of her ultrasound.  Will fill out proper paperwork and bring to ultrasound for her visit on 04/03/12.

## 2012-03-29 LAB — PRENATAL PROFILE
Baso # K/uL: 0 10*3/uL (ref 0.0–0.1)
Basophil %: 0.3 % (ref 0.1–1.2)
Eos # K/uL: 0.1 10*3/uL (ref 0.0–0.4)
Eosinophil %: 1.2 % (ref 0.7–5.8)
HBV S Ag: NEGATIVE
Hematocrit: 35 % (ref 34–45)
Hemoglobin: 11.1 g/dL — ABNORMAL LOW (ref 11.2–15.7)
Lymph # K/uL: 2.6 10*3/uL (ref 1.2–3.7)
Lymphocyte %: 33.5 % (ref 19.3–51.7)
MCV: 88 fL (ref 79–95)
Mono # K/uL: 0.6 10*3/uL (ref 0.2–0.9)
Monocyte %: 7.6 % (ref 4.7–12.5)
Neut # K/uL: 4.4 10*3/uL (ref 1.6–6.1)
Platelets: 286 10*3/uL (ref 160–370)
RBC: 4 MIL/uL (ref 3.9–5.2)
RDW: 17.1 % — ABNORMAL HIGH (ref 11.7–14.4)
Rubella IgG AB: POSITIVE
Seg Neut %: 57.4 % (ref 34.0–71.1)
Syphilis Screen: NEGATIVE
Syphilis Status: NONREACTIVE
WBC: 7.7 10*3/uL (ref 4.0–10.0)

## 2012-03-29 LAB — HGB ELECT,REVIEW

## 2012-03-29 NOTE — Progress Notes (Signed)
Katie from [facility] lab called to confirm what testing was needed on patient's 24 hour urine collection. Referenced notes: patient to have 24 hour urine protein. Katie agrees to do so.

## 2012-04-03 ENCOUNTER — Ambulatory Visit: Payer: Self-pay

## 2012-04-03 ENCOUNTER — Other Ambulatory Visit: Payer: Self-pay | Admitting: Gastroenterology

## 2012-04-10 ENCOUNTER — Ambulatory Visit: Payer: Self-pay | Admitting: Obstetrics and Gynecology

## 2012-04-10 ENCOUNTER — Encounter: Payer: Self-pay | Admitting: Obstetrics and Gynecology

## 2012-04-10 VITALS — BP 124/81 | Ht 64.0 in | Wt 270.0 lb

## 2012-04-10 DIAGNOSIS — Z3490 Encounter for supervision of normal pregnancy, unspecified, unspecified trimester: Secondary | ICD-10-CM

## 2012-04-10 LAB — POCT URINALYSIS DIPSTICK
Bilirubin,Ur: NEGATIVE
Blood,UA POCT: NEGATIVE
Glucose,UA POCT: NORMAL
Ketones,UA POCT: NEGATIVE
Lot #: 22041202
Nitrite,UA POCT: NEGATIVE
PH,UA POCT: 5 (ref 5–8)
Specific gravity,UA POCT: 1.025 (ref 1.002–1.03)
Urobilinogen,UA: NORMAL

## 2012-04-10 NOTE — Progress Notes (Signed)
OB CHECK at [redacted]w[redacted]d    SUBJECTIVE  Jenna [name] presents for OB check at [redacted]w[redacted]d.  Denies headaches, vision changes, epigastric pain, swelling of extremities, dysuria, or vaginal complaints. Pt reports constipation despite using colace. Pt is leaving jail tomorrow 04/11/12. Adjusted patient's EDD based on dating ultrasound. Pt requests first trimester screen.  Denies LOF, VB, ctx.  +FM  Pt is present with jail guard today.    OBJECTIVE  Filed Vitals:    04/10/12 1303   BP: 124/81   Height: 5\' 4"  (1.626 m)   Weight: 270 lb (122.471 kg)           ASSESSMENT/PLAN  26 y.o. [id number] at [redacted]w[redacted]d by early ultrasound  Patient Active Problem List    Diagnosis Date Noted   . Cocaine abuse, in remission 03/27/2012   . Supervision of other normal pregnancy 03/27/2012   . Cesarean section 11/23/2009   . Hidradenitis 11/23/2009   . Tobacco abuse 11/23/2009   . Migraine Headache 02/23/2001     Created by Conversion       . Acne 01/16/2000     Created by Conversion       . Obesity 07/19/1997     Created by Conversion       . Allergic Rhinitis 09/21/1995     Created by Conversion         Plan for first trimester screen ultrasound/ blood work   Discussed warning signs of pregnancy and when to call the office- pt verbalizes understanding  Discussed nutrition recommendations for constipation, increased fluids and colace BID- may use Miralax prn  Return to clinic in 4 weeks.

## 2012-04-10 NOTE — Patient Instructions (Signed)
wOCommon Discomforts:  SECOND TRIMESTER    You have made it through the first third of your pregnancy! The middle 3 months--the second trimester--are usually the easiest. Although some women still have nausea and feel especially tired for their whole pregnancy, most women are feeling much better.   Your growing baby and uterus can cause some discomforts, though.  Here are 4 common ones and ways to deal with them:    Back Pain  Lower back pain can be caused by muscle strain (from stretching, bending or lifting).   Sometimes your hormones change and leave you more at risk for back pain. Weak muscles also strain and hurt more easily.  Here are some things that can lesson you discomfort:  • Use proper body mechanics by lifting with your back straight and your knees bent.  Do not twist while lifting or pulling.  Stand and sit with your back as straight as possible.   • Avoid standing too long in one spot. If you can, rest one foot on a footstool or step while you are standing.  Change which foot you lean on every few minutes.   • Wear a special support girdle for pregnant women.   You can find these at maternity shops or a medical supply store.   • Use a footstool when you have to sit for long periods, so your legs are no dangling.  • Ask your partner or a friend for a back rub, or use a heating pad on low setting to relieve aches. Sometimes a warm, not hot, bath feels good too.  • Try to sleep on a firm, supportive mattress. Put a board under your mattress if it is too soft.   Use lost of pillows!  Wedge one under your belly when you are on your side and use one between your knees while on your side too.  Avoid lying flat on your back.  Lean into a pillow placed under your side instead.  • Do pelvic tilt exercises.  On your hands and knees like an angry cat.  Rock forward and backward a little to stretch out your back.   On your back, bend your knees, then lift your bottom up in the air so only your head, shoulders  and feet touch the floor.  Hold this position for a few seconds, then relax and do it again.   • Wear good walking shoes as much as possible.  Try not to wear shoes with a heel higher than 2 inches.  If your backache “comes and goes” in a regular pattern or if you have leakage or bleeding from your vagina, call your health care provider right away.    Hemorrhoids  Hemorrhoids are also called piles.  They are extra-large blood vessels in your rectum (where you have bowel movements) that itch and sometimes bleed.  They are caused    by the increased pressure of your baby in your pelvis (area between your hips).  The pressure of the baby blocks off the blood vessels and causes them to get too big.  Things that help keep hemorrhoids from happening to getting too bad include the following:  • Avoid constipation.  Eat lots of fruits and vegetables so you have a bowel movement every day if possible.  Ask your health care provider if you need a laxative to help you have regular bowel movements.  • Do not strain or push when having a bowel movement.  Breathe slowly, relax and let   it come out without pushing.  • Drink more fluids and eat foods high in fiber, like whole grains.  • Do not have anal sex (in which the man puts his penis in your anus, where bowel movements come out).  • Take stiz baths with cool water 2-3 times a day.  A sitz bath is a little tub that fits on your toilet and lets you soak your bottom without getting the rest of your body wet. You can buy a stiz bath kit at a drugstore or medical supply store.   • Use ice packs or witch hazel pads (like Tucks) to take the swelling down.   • Do Kegel exercises.  A Kegel exercise is when you tighten the muscles in your bottom. Next time you urinate, try to stop the urine.  The muscle tightening you do to stop your urine is a Kegel exercise.  After you figure out how, only do Kegel exercises when you are not urinating. Starting and stopping your urine flow too much  can lead to infections or bladder problems.  • Try to sit with your legs crossed tailor style (both feet tucked under the opposite leg, with the knees sticking out) or sit on hard chairs.  Do not sit too long in any chair.  • I you notice bleeding when you go to the bathroom, talk with your health care provider.     Leg Cramps  Leg cramps happen in pregnancy, but it is unclear why.  Things that can help prevent or lessen them include the following:  • Get more calcium in your diet.  Se the guide on Nutrition during Pregnancy for some ways of getting calcium in your diet. In addition to dairy products, there is calcium-fortified orange juice.  Ask your health care provider if you can take a calcium supplement (like Tums).   • Exercise.  Even walking 30 minutes a day can help.  And you can walk for 10 minutes 3 times a day for the same benefit.   • If you get a cramp, try straightening your leg.  Sometimes pointing your toe toward your knee helps.  Stand a foot or so from a wall and lean toward it keeping your knees straight and your feet flat on the floor.  • Massage or apply mild heat with a warm wash cloth or heating pad on low to the cramping area.  If you feel a hard knot or hot spot on your leg, especially in the calf (back of the lower leg that does not go away, call you health care provider right away.    Varicose Veins  Varicose veins show up as blue line under the skin usually on your lower legs.  Sometimes they are thick like cords.  They can be due to your hormones, to pressure on your pelvis from the baby, or to our family tendency (someone else in your family had them).  Things you can do to help present varicose veins includes the following:  • Avoid constrictive (tight) clothing, especially if it is tight in your waist or hip areas.  Also avoid knee socks or hose with elastic bands at the top.  • Try not to sit or stand for long periods without moving around.   • Use good posture and good body  mechanics.  (See the section of this guide on back pain).    If you get varicose veins, these things can help keep them from bothering you or getting   worse:  • Wear good support hose.  You can buy support hose at the maternity shops or medical supplies stores.   • Raise your legs on a footstool when sitting.  Lie down and put your feet up higher than your head every chance you get.  • Talk with your HCP about other things to keep you comfortable.     Many women never have any of these discomforts during their pregnancy.  Others have a lot of discomforts.  Be sure to tell your Health Care Provider about anything that you find uncomfortable.  Write down your questions as they come up so you will remember to ask them during your next office visit or phone call.      Warning Signs: SECOND TRIMESTER    No one wants something to go wrong during her pregnancy.  It can be really temping to not tell anyone when something happens; you just hope it will go away on its own.  Usually something that worries you is not a big problem but it is important to talk to your health care provider any time you have a worry.  Most of the time, bigger problems can be prevented if you get help early enough.  During your second trimester, there are three warning signs that you should call your Health Care Provider about:    Bleeding  After your twentieth week or fifth month of pregnancy, bleeding can be due to several causes.  There can be problems with the placenta.  Placenta previa is when the placenta forms across the cervix (opening to the uterus): placenta abruption is when the placenta pulls ways from the inside of the uterus.  Sometimes sores or scratches on your cervix or vagina can cause bleeding.  Vaginal infections or polyps (see the guide on Sexually Transmitted Diseases) can cause bleeding.  Preterm labor may also cause bleeding.      You may or may not have pain with vaginal bleeding.  It is very important to call your Health  Care Provider or go to the hospital to be examined any time you have vaginal bleeding during your pregnancy.    Pain  Abdominal or pelvic (between your hips) pain during your pregnancy needs to be checked out by your Health Care Provider.       Non-pregnancy causes of pain need to be considered along with pregnancy causes.  Back pain that comes and goes in a pattern or does not go away at all need to be checked out.  Call your Health Care Provider or if the pain is really bad or sudden, go to the hospital where you plan to deliver so you can be examined.     Infection and Fever  When you are pregnant, you can have “normal” colds and infections.  But it is important to tell your Health Care Provider if you have:  • A fever of more than 100.4 degrees F or 38.0 C  • A burning sensation or pain when you urinate or a need to go to the bathroom often  • Green or yellow drainage from your nose, or a tendency to cough up green or yellow phlegm.  • Toothaches, bleeding gums, or sores in the mouth that do not go away.  • Vomiting or diarrhea that last more than an hour or so.  • Any symptom that worries you    If you have a fever, you need to increase the amount of fluids you drink. Fluids help   you flush infection out of your body.  They also help keep you from getting dehydrated (dried out), which is bad for you and the baby. If you have an infection it is also best to get more rest so that your body has the energy to fight the germs.   Antibiotics (medicines that treat infections) are safe during pregnancy and may be necessary to protect your baby from any possible harm from your infection.  Your Health Care Provider will order medicine if you need it.     Remember--you and your Health Care Provider are a team, working to help you have a safe pregnancy and a healthy baby.  Be sure to let your Health Care Provider know of anything that concerns you.  Ignoring or worrying about a problem won’t help it to go away! Getting help  right away can make a big difference in your health and your baby’s safety.Exercise in Pregnancy                                       Regular exercise builds bones and muscles, gives you energy and keeps you healthy.  It is just as important when you are pregnant.   Exercise helps you look and feel better during a time when your body is changing.    Benefits of Exercise  You are tired. You’re gaining weight.  You may not feel your best.  Although these symptoms are normal during pregnancy, there is a way to find relief.  Becoming active and exercising on most, if not all days of the week can benefit your health in many ways:  • Helps reduce backaches, constipation, bloating and swelling  • Give you energy  • Improves your mood   • Improves your posture  • Promotes muscle tone, strength and endurance  • Helps you sleep better  Regular activity also helps keeps you fit during pregnancy and may improve your ability to cope with the pain of labor.  This will make it easier for you to get back in shape after the baby is born.  You should not, however, exercise to lose weight while you are pregnant.    Changes in your Body  Pregnancy causes many changes in your body.  Some of these changes will affect your ability to exercise.    Joints  The hormones produced during pregnancy cause the ligaments that support your joints to become relaxed.  This makes the joint more mobile and more at risk of injury.  Avoid jerky, bouncy or high-impact motions that can increase your risk of injury.    Balance  Remember that during pregnancy you are carrying extra pounds--as much as 20-30 pounds at the end of pregnancy.  The extra weight in the front of our body shifts our center of gravity and places stress on joints and muscles, especially those in the pelvis and lower back.  This can make you less stable and cause back pain and make you more likely to lose your balance and fall, especially in later pregnancy.    Heart Rate  The extra  weight you are carrying will make our body work harder than before you were pregnant.  Exercise increases the flow of oxygen and blood to the muscles being worked and away from other parts of our body.  So, it’s important not to overdo it. Try to exercise moderately so you don’t get   tired quickly.  If you are unable to talk normally while exercising your activity is too strenuous.     Getting Started  Before beginning your exercise program, talk with your doctor or nurse practitioner to make sure you do not have any health conditions that may limit your activity.  Ask about any specific exercises or sports that interests you. Your health care provider can offer advice about what type of exercise routine is best for you.  Women with one of the following conditions may be advised by their provider not to exercise during pregnancy:  • Pregnancy induced hypertension  • Symptoms or history of preterm labor (early contractions)  • Vaginal bleeding  • Premature rupture of membranes  Pregnant women with certain other medical conditions, such as high blood pressure, heart disease and lung disease will be advised by their provider when and if exercise is appropriate.    Choosing Safe Exercises  Most forms of exercises are safe during pregnancy.  But some types of exercise involve positions and movement that may be uncomfortable, tiring or harmful for pregnant women.  For instance, after 20 weeks of pregnancy, women should not do exercises that require them to lie flat on their back, because it may be more difficult for the blood to circulate.   Walking is considered the best exercise for anyone.  Brisk walking gives a good total body workout and is easy on the joints and muscles.  Other good activities for pregnant women include swimming and stationary biking.     Your Routine  Exercise during pregnancy is most practical during the first 24 weeks.  During the last 3 months, it can be difficult to do many exercises that once  seemed easy. This is normal.     If it has been some time since you’ve exercised, it is a good idea to start slowly.  Begin with as little as 5 minutes a day and add 5 more minutes a week until you can stay active for 30 minuets a day.     Always begin each exercise session with a warm-up period for 5-10 minutes.  This is light activity, such as slow walking, that prepares your muscles for activity.  During the warm up, stretch your muscles to avoid stiffness and soreness.  Hold each stretch for at least 10-20 seconds--do not bounce.    After exercising, cool down by slowly reducing your activity.  This allows your heart rate to return to normal levels.  Cooling down for 5-10 minutes and stretching again also helps you to avoid sore muscles.  Hold each stretch for 20-30 seconds--don’t bounce.     Things to Watch  The changes your body is going through can make certain positions and activities risky for you and your baby.  While exercising try to avoid activities that call for jumping, jarring motions or quick change in direction that may strain your joints and cause injury.    There are some risks from becoming overheated during pregnancy.  This may cause loss of fluids and lead to dehydration and problems during pregnancy.  Overheating in the first 8 weeks of pregnancy may be a contributing factor to the development of birth defects.  When you exercise, follow these general guidelines for a safe and healthy exercise program:  • After 20 weeks of pregnancy, avoid doing any exercises on your back.  • Avoid brisk exercise in hot, humid weather or when you are sick with a fever.  • Wear comfortable   clothing that will help you to remain cool.  • Wear a bra that fits well and gives lots of support to help protect your breasts.  • Drink plenty of water to help keep you from overheating and dehydrating.  • Make sure you consume the extra 300 calories a day you need during pregnancy.  Pay attention to your body while you  exercise.  Do not exercise to the point that you are exhausted.  Be aware of the warning signs that you may be exercising too strenuously (see box).  If you notice any of these symptoms, stop exercising and call your provider.    Warning Signs  Stop exercising and call your provider if you get any of these symptoms:    Pain                                                                 Vaginal bleeding  Dizziness or feeling faint                                Increased shortness of breath  Rapid heartbeat                                               Difficulty walking  Uterine contractions and chest pain                Fluid leaking from the vagina    After the Baby is Born  Having a baby and taking care of a newborn is hard work.  It will take awhile to regain your strength after the strain of pregnancy and birth.  Taking care of yourself physically and allowing your body time to recover is important.  If you had a cesarean delivery, a difficult birth, or complications, your recovery time may be longer.  Check with your provider before starting or resuming an exercise program.    Walking is a good way to get back into exercising.   Brisk walks several times a week will prepare you for more strenuous exercise when you feel up to it.  Walking had the added advantage of getting both you and the baby out of the house for exercise and fresh air.  As you feel stronger, consider more vigorous exercise.     You will want to pick an exercise program that meets your own needs.  Your doctor, nurse or community center can help.  There are also special post-partum exercise classes that you can join.    Finally….  Exercise during pregnancy can help prepare you for labor and childbirth.  Exercising afterward can help get you back in shape.  Before you begin an exercise program, talk to your provider. Then, follow the guideline given here to help maintain a safe and healthy exercise program during pregnancy.    Reviewed  12/2009  Nutrition during Pregnancy  Good eating habits are always important.  They are even more important when you are pregnant or even planning a pregnancy.  The foods that you eat are the main source of energy for your baby.       Healthy food choices prepare you for childbirth and breast-feeding as well as promote a healthy fetus.  If your diet was healthy before pregnancy, you will need to make only minor changes in your eating habits to meet the extra needs of pregnancy.  You certainly do not have to “eat for two”, as the old saying goes.    Calories and Weight Gain  Your diet is the main source of energy for the growth of your fetus.  An average non-pregnant woman requires between 1,800 and 2,200 calories each day.  When you are pregnant, you need only about 300 more calories each day to stay healthy and help the fetus grow.     In a healthy pregnancy, a woman will gain weight as the fetus grows.  Gaining the right amount of weight during the pregnancy is important for the fetus’s development and health.  Pregnancy is not the time to lose weight.  If you were underweight before pregnancy you may be at risk of having a low birth weight baby (weighing less than 5 ½ pounds).  Labor isn’t made easier if the baby is small or underweight.  These babies may have problems after birth, such as illness and learning and behavior problems.    Discuss with your doctor or nurse practitioner the best weight gain for you.  The amount of weight you should gain depends on how much you weighed before pregnancy.  Usually, you can expect to gain about 10 pounds during the first 20 weeks and about 1 pound per week during the rest of your pregnancy.     Exercise can help you adjust to carrying the extra weight you gain.  It promotes muscle tone, strength and endurance.  Staying active can also help reduce discomforts of pregnancy such a backache, constipation, fatigue and bloating.  If you eat a healthy diet and gain weight as you  should while you are pregnant, chances are that your fetus will gain weight properly, too.  Talk with your doctor or nurse practitioner if you are concerned about weight gain.    Recommended Weight Gain in Pregnancy    Pre-Pregnant Condition Recommended Weight Gain   Underweight 28-40 pounds   Normal weight 25-35 pounds   Overweight 15-25 pounds   Carrying Twins 35-45 pounds       Where Does the Weight Go?  In pregnancy your body must store nutrients and increase the amount of food and other fluids it produces.  One of the reasons for the extra fat stores is to prepare you to produce milk to breast feed our baby.  On average, here is where your weight will go:    Pounds Where it goes   7 Maternal stores of fat, protein & other nutrients   4 Increased fluid   4 Increased blood   2 Breast growth   2 Uterus growth   6-8 Baby   2 Amniotic fluid   1 1/2 Placenta     A Healthy Diet    Basic Nutrients  Every diet should include proteins, carbohydrates (sugar and starches), vitamins, minerals and fats.  Your body uses all of these nutrients for growth and repair.  In pregnancy, your fetus depends on the foods you eat for nutrients.  To be sure that your diet gives you the right amount, you need to know which foods are good sources of each.    When you look at the labels on boxes, cans and bottles, you will often see the   letters RDA.  This stands for Recommended Daily Allowance.  RDAs are the levels of nutrients necessary to meet the needs of almost every healthy person on a daily basis.  If you consume less than the daily RDAs, you may not necessarily have a poor diet.  But if your diet provides nutrients in amount equals to the RDAs, it is very likely that you are eating right.    During pregnancy, the RDAs are higher for most nutrients.  If you eat a variety of foods and eat the suggested servings of foods, you will be eating a very good diet.    Carbohydrates  Carbohydrates (sugars and starches) are your main source of  energy. During pregnancy, your body provides for your fetus’s breathing and digestion, so you need more energy.  You get it from carbohydrates in grains and cereals such as bread, rice and pasta and in starchy vegetables such as potatoes, beans and corn.    Carbohydrates should provide more than half of the total calories in your diet.  Simple sugars are high in added sugars--items such a candies, cane sugar, honey, jellies, cakes, cookies and soft drinks.  Rely on starches, which provide both energy and fiber.    Proteins  Proteins give you nutrients to maintain muscles, produce blood and fight disease.  During pregnancy protein helps to produce the additional blood you and your fetus need to help your baby grow properly.  You get proteins from poultry, fish, meat, dairy products, nuts and some combinations of grains and vegetables (like beans and rice).    Vitamins and Minerals  Vitamins and minerals build bones, muscles and blood cells.  During pregnancy, your body needs to produce more blood and build your fetus’s bones.   To do that, you need more minerals like iron, calcium and phosphorus and vitamins like folic acid.  You can get these nutrients from beans and peas, green leafy vegetables, dairy products, whole grain enriched cereals, and meat.  You don’t need to eat a low salt or low sodium diet during pregnancy unless your doctor or nurse practitioner has advised it.     If you are not able to get all the vitamins and minerals you need from your diet, your provider may prescribe supplements.  Extra iron may be given.  Don’t take more vitamins than your provider prescribes.     Fats and Oils  These contain calories but little or no vitamins or minerals.  Fat should make up no more than 30 % of your daily calories.  Saturated fats come mostly from meat and dairy products.  They should make up less than one third of the total fat in your diet, or no more than 10% of your daily calories.  Unsaturated fats come  from plant and vegetable oils.  Choose lower-fat foods such as fruits, vegetables, starches and lean meats and low fat or skim dairy products.  Limit your use of fats added in cooking or at the table--items such as butter, margarine, salad dressing and gravies.     Water  Water is not often thought of as a nutrient, but life cannot exit without it.  It is used to help your body function and prevent constipation.  In addition to the water that is used by your body, some is lost through sweat and urine.  To be sure that you are getting enough water, you should drink six to eight glasses of water each day.    Meal Planning    Use the following guide to help plan your daily meals, choosing healthy foods that you like.  Eat a wide variety of foods each day.  Try to get all the servings you need during the day through meals and snacks.     Breads, cereals, rice and pastas provide complex carbohydrates, vitamins, minerals and fiber.  You should have 6-11 servings per day.  One slice of bread or ½ cup of rice, cereal or pasta counts as one serving.  Fruits and vegetables provide vitamins (especially A and C), folic acid, minerals, carbohydrates and fiber.  You should 5-9 servings per day.  One serving is equal to ½ -1 cup of fruits or vegetables.   Fruit juices should be selected rather than fruit flavored drinks.     Meats, poultry, fish, eggs, dry beans and nuts provide protein, iron, zinc and B vitamins.  You should have 2-3 servings per day.  One serving is equal to 2-3 ounces of cooked meat, fish or poultry, one egg or ½ cup of cooked dry beans.     Milk, yogurt and cheese are a major source of calcium, protein and vitamins.  You should have 3-4 serving per day.  Choose low fat, skim or part-skim whenever possible.     Some pregnant women find that their appetite comes and goes.  Having healthy snacks that you can eat during the day is a good way to get the nutrients and extra calories you need.   Pick foods that are not  high in fats or added sugars.  Fruit, raw vegetables, cereal and low-fat yogurt are healthy snacks.    Especially later in pregnancy, you may feel more comfortable eating small meals up to six times a day.  Be sure that the total of these mini-meals add up to the number of servings of the basic foods you need each day.  Milk and a sandwich--made with protein sources like low-fat cheese with lettuce and tomato, chicken, fish, meat, eggs or peanut butter make a good mini meal.  Other ideas are milk and fresh fruits, cheese and crackers, raw vegetables with low-fat dip, cereals and skim milk, and soup.    Your provider or dietitian can help you make a food plan that is right for you.  Use these hints for a healthy way of eating.  • Try to make a daily food plan choose a variety of items from each of the food groups.  • Focus on eating the right foods, not on watching your weight.   Never try to lose weight during pregnancy.  • Read the nutrition labels on all packaged food and drinks that you buy.  This will help you buy foods that are high in nutrient value, but low in fat.  • Encourage your family to eat the same healthy foods that you do.  It is best when everyone eats the same food at mealtime.  That way, everyone gets the same healthy benefits and makes the meal less work.    Special Concerns    Teenagers  Teenagers may not always pay close attention to their diet.  However, it is important that they do so during pregnancy.  Teens are still growing themselves, so they need to provide for their own growth as well as for the growth of their babies.    Because their own bones are still growing, they need up to one and a half times more calcium and phosphorus than pregnant adults.  They may be   able to get these extra minerals from skim or low-fat milk (at least 4 cups per day), or they may need to take mineral supplements that their provider prescribes.  Teenagers also need slightly more calories and protein than  adults.      Vegetarian Diets  A vegetarian diet that includes milk, cheese, eggs, cereals, nuts and seeds in addition to vegetables and fruits can be adequate for a pregnant woman.  Grains and vegetables can be combined (for instance, black-eyed peas and rice, peanut butter sandwich, lima beans and corn) to get all the proteins the bodying can use.       Pregnant women on vegan diets, who consume no animal products, may need vitamin B 12, D and zinc supplements prescribed by their provider.  If you are a vegetarian, you should carefully plan our pregnancy diet with your doctor, nurse practitioner or dietitian.    Lactose Intolerance  Some women have symptoms such as nausea, diarrhea and indigestion after eating milk products.  This is known as lactose intolerance. If you are pregnant and have lactose intolerance, your doctor or nurse practitioner may prescribe calcium supplements.    Cravings  Pregnant women sometimes have cravings for certain foods.  On occasion, giving into a craving should cause no problems as long as the rest of the woman’s diet is well balanced and what she eats is not harmful.  Some women, however, find they have a strong desire to eat things that are not foods, such a clay or laundry starch.  This craving is called pica. If you have this kind of craving, talk to your doctor or nurse practitioner.     Breast Feeding  Many women today breast feed their babies. Mother’s milk is the most balanced food for babies and helps them resist disease and allergies.  During pregnancy, our body stores extra nutrients to prepare you to nurse.   Even so, while you are breast feeding, you will need food for your own body plus extra food to produce milk for your baby.  You will need about 200 calories more than you needed during pregnancy, or about 500 more than you needed before pregnancy.  Your baby will drink a pint of milk a day, increasing to a quart a day.  It is important to maintain your protein and  calcium intake and drink plenty of fluids while breast feeding.     Finally…  Eating right and exercising during your pregnancy is one of the best things you can do for yourself and your baby.  Take a look at the foods in your daily diet.  Be aware of how they provide the calories and nutrients you and your baby need.   Start your healthier diet today.  Your baby will benefit and so will you--not only now, but for the rest of your life.

## 2012-04-11 NOTE — Progress Notes (Signed)
Social Work Note -   Received phone call from pt stating that she was released from jail yesterday and is currently staying at [employer] [facility] [phone]).  Pt reports that she will be at [facility] for at least 30 days.  Pt is interested in a referral to a perinatal support program.  Pt has scheduled an ultrasound for 2/7 - agreeable to meeting with SW after that appt to further discuss.  Plan -   1.will ask APRs to schedule pt with SW after her ultrasound  2.transferred pt to appt line to schedule next OB appt    Elissa Hefty, LMSw [phone]

## 2012-04-21 ENCOUNTER — Other Ambulatory Visit: Payer: Self-pay | Admitting: Gastroenterology

## 2012-04-21 ENCOUNTER — Ambulatory Visit: Payer: Self-pay

## 2012-05-05 ENCOUNTER — Ambulatory Visit: Payer: Self-pay

## 2012-05-05 ENCOUNTER — Encounter: Payer: Self-pay | Admitting: Student in an Organized Health Care Education/Training Program

## 2012-05-05 ENCOUNTER — Ambulatory Visit: Payer: Self-pay | Admitting: Student in an Organized Health Care Education/Training Program

## 2012-05-05 VITALS — BP 135/82 | Ht 64.0 in | Wt 273.0 lb

## 2012-05-05 DIAGNOSIS — L732 Hidradenitis suppurativa: Secondary | ICD-10-CM

## 2012-05-05 DIAGNOSIS — F1411 Cocaine abuse, in remission: Secondary | ICD-10-CM

## 2012-05-05 DIAGNOSIS — F32A Depression, unspecified: Secondary | ICD-10-CM | POA: Insufficient documentation

## 2012-05-05 DIAGNOSIS — E669 Obesity, unspecified: Secondary | ICD-10-CM

## 2012-05-05 DIAGNOSIS — Z720 Tobacco use: Secondary | ICD-10-CM

## 2012-05-05 DIAGNOSIS — J309 Allergic rhinitis, unspecified: Secondary | ICD-10-CM

## 2012-05-05 DIAGNOSIS — G43909 Migraine, unspecified, not intractable, without status migrainosus: Secondary | ICD-10-CM

## 2012-05-05 DIAGNOSIS — Z3480 Encounter for supervision of other normal pregnancy, unspecified trimester: Secondary | ICD-10-CM

## 2012-05-05 DIAGNOSIS — Z98891 History of uterine scar from previous surgery: Secondary | ICD-10-CM

## 2012-05-05 LAB — POCT URINALYSIS DIPSTICK
Blood,UA POCT: NEGATIVE
Glucose,UA POCT: NORMAL
Ketones,UA POCT: NEGATIVE
Leuk Esterase,UA POCT: NEGATIVE
Lot #: 22041202
Nitrite,UA POCT: NEGATIVE
PH,UA POCT: 6 (ref 5–8)
Specific gravity,UA POCT: 1.015 (ref 1.002–1.03)
Urobilinogen,UA: NORMAL

## 2012-05-05 MED ORDER — ONDANSETRON 4 MG PO TBDP *I*
4.0000 mg | ORAL_TABLET | Freq: Three times a day (TID) | ORAL | Status: DC | PRN
Start: 2012-05-05 — End: 2012-06-09

## 2012-05-05 MED ORDER — CLINDAMYCIN PHOSPHATE 1 % EX SOLN *I*
Freq: Two times a day (BID) | CUTANEOUS | Status: DC
Start: 2012-05-05 — End: 2012-06-23

## 2012-05-05 MED ORDER — CLASSIC PRENATAL 28-0.8 MG PO TABS *I*
1.0000 | ORAL_TABLET | Freq: Every day | ORAL | Status: DC
Start: 2012-05-05 — End: 2012-06-23

## 2012-05-05 NOTE — Progress Notes (Signed)
[facility]  OB Check    HPI:  Jenna [name] is a 27 y.o. [phone] female at [redacted]w[redacted]d who presents today for routine OB check.  She is complaining of occasional nausea. She denies contractions,  LOF,  VB and decreased fetal movement.    PHYSICAL EXAM:  Filed Vitals:    05/05/12 1000   BP: 135/82   Height: 5\' 4"  (1.626 m)   Weight: 273 lb (123.832 kg)     See Flowsheet    Gen: Normal appearing pregnant female in NAD  Resp: clear to auscultation bilaterally  CV: normal rate, regular rhythm, normal S1, S2, no murmurs, rubs, clicks or gallops  Abd: Gravid non-tender  Pelvic: deferred  Ext: No edema noted    Doptones: 143 bpm by ultrasound  Fundal Height: 14 cm    ASSESSMENT:  27 y.o. [id number] at [redacted]w[redacted]d by 9 wks ultrasound, here for OB check.    PLAN:  Patient Active Problem List    Diagnosis Date Noted   . Depression 05/05/2012     Priority: Low     Depression, currently on no meds    Related to H/o death of infant at 5 months 2/2 SIDS      Pt is currently stable and without depressive symptoms     . 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      . Supervision of other normal pregnancy 03/27/2012     RH+ Rubella Immune  Early glucola: 93  1st trimester USN: NT 1.6 mm  1st trimester screen ordered but not performed due to dating discrepancy  H/o cHTN vs GHTN  H/o Preeclampsia with prior delivery    <> 24 hr urine protein  <> Last pap 10/2009; perform pap at next visit  <> 2nd trimester screen ordered (mAFP)  <> anatomic scan     . Status post cesarean delivery 11/23/2009     H/o 2 prior c/sections  1st for NRFHT, 2nd for scheduled repeat     . Hidradenitis 11/23/2009     Chronic suppurativa hidradenitis of axila  05/05/12: referral to General Surgery    Started pt on Clindamycin 1% soln, apply BID     . Tobacco abuse 11/23/2009     1-2 cigs/day  Smoking cessation counseling performed     . Migraine Headache 02/23/2001             . Obesity 07/19/1997     BMI 46.8  Goal for TWG in pregnancy: 10-15 lbs       . Allergic Rhinitis 09/21/1995              Return to clinic in 4 weeks.  Discussed with Dr. Lebron Quam MD  Obstetrics and Gynecology  PGY 2  Pager: [phone]

## 2012-05-10 NOTE — Progress Notes (Signed)
Social Work Note:      - Follow up      Patient has moved out of the halfway house and is now living at [employer] with [name]/boyfriend "[name]". Patient is reporting she has applied for [facility] and TA. Pt states that Gerlene Burdock is a Social research officer, government and is financially supporting them. Ms. Deener states Gerlene Burdock has been a good support to her and has helped her stay clean. Patient denies being on probation after her release from jail; however, pt has been mandated by [facility] to attend drug therapy 4x a week [pt went to jail for petty larceny and has substance abuse hx]. Patient had a negative UCDS on 03/27/2012]. Patient's aunt Britta Mccreedy Butler] continues to have custody of pt's children. Pt's aunt is bring Ms. Alcala's children to visit the new apartment this weekend. Patient reports that there is a plan in the works to have her children slowly returned to her care. Per chart review, pt has discussed perinatal support programs at last visit. Patient is reporting that she would like to be referred to [facility]. Pt is denying any current transportation issues - Gerlene Burdock has a car and plans to bring pt to appointments.   Plan:  SW to refer patient to [facility]   Pt met with [facility] re: current insurance status  SW to follow up on referrals   [name], LMSW  [phone], [phone]

## 2012-05-17 ENCOUNTER — Encounter: Payer: Self-pay | Admitting: Obstetrics and Gynecology

## 2012-05-17 ENCOUNTER — Ambulatory Visit: Payer: Self-pay | Admitting: Obstetrics and Gynecology

## 2012-05-17 VITALS — Ht 64.0 in | Wt 278.0 lb

## 2012-05-17 DIAGNOSIS — Z3480 Encounter for supervision of other normal pregnancy, unspecified trimester: Secondary | ICD-10-CM

## 2012-05-17 DIAGNOSIS — N898 Other specified noninflammatory disorders of vagina: Secondary | ICD-10-CM

## 2012-05-17 DIAGNOSIS — F32A Depression, unspecified: Secondary | ICD-10-CM

## 2012-05-17 LAB — POCT URINALYSIS DIPSTICK
Bilirubin,Ur: NEGATIVE
Blood,UA POCT: NEGATIVE
Glucose,UA POCT: NORMAL
Ketones,UA POCT: NEGATIVE
Leuk Esterase,UA POCT: 1 — AB
Lot #: 22041202
Nitrite,UA POCT: NEGATIVE
PH,UA POCT: 6 (ref 5–8)
Specific gravity,UA POCT: 1.02 (ref 1.002–1.03)
Urobilinogen,UA: NORMAL

## 2012-05-17 MED ORDER — TERCONAZOLE 0.8 % VA CREA *A*
1.0000 | TOPICAL_CREAM | Freq: Every evening | VAGINAL | Status: AC
Start: 2012-05-17 — End: 2012-05-20

## 2012-05-17 NOTE — Progress Notes (Signed)
OB CHECK at [redacted]w[redacted]d     SUBJECTIVE  Jenna [name] presents for OB check at [redacted]w[redacted]d. Vaginal itching and irritation for 5 days. Changed soaps. No concern for STI  Denies LOF, VB, ctx.     OBJECTIVE  Filed Vitals:    05/17/12 1411   Height: 5\' 4"  (1.626 m)   Weight: 278 lb (126.1 kg)     Pelvic exam: normal external genitalia, vulva, vagina, cervix, uterus and adnexa, VULVA: normal appearing vulva with no masses, tenderness or lesions, VAGINA: vaginal discharge - white, curd-like, odorless and thick, CERVIX: normal appearing cervix without discharge or lesions, WET MOUNT done - results: KOH done, hyphae, spermatozoa noted, spores.    FHR: 150's    ASSESSMENT/PLAN  27 y.o. [id number] at [redacted]w[redacted]d by early ultrasound with pregnancy complicated by Yeast    Patient Active Problem List   Diagnosis Code   . Status post cesarean delivery V45.89   . Hidradenitis 705.83   . Tobacco abuse 305.1   . Migraine Headache 346.90   . Allergic Rhinitis 477.9   . Obesity 278.00   . Cocaine abuse, in remission 305.63   . Supervision of other normal pregnancy V22.1   . Depression 311     Reviewed vaginitis precautions.  reviewed early pregnancy warnings and comfort measures  RX for Terazol sent to pharmacy. Instructions for use reviewed.  Week regular OB appt.  RTC if symptoms worsen or do not improve with treatment.

## 2012-06-06 ENCOUNTER — Encounter: Payer: Self-pay | Admitting: Student in an Organized Health Care Education/Training Program

## 2012-06-09 ENCOUNTER — Ambulatory Visit: Payer: Self-pay | Admitting: Obstetrics and Gynecology

## 2012-06-09 ENCOUNTER — Encounter: Payer: Self-pay | Admitting: Obstetrics and Gynecology

## 2012-06-09 ENCOUNTER — Ambulatory Visit: Payer: Self-pay

## 2012-06-09 ENCOUNTER — Other Ambulatory Visit: Payer: Self-pay | Admitting: Gastroenterology

## 2012-06-09 VITALS — BP 121/84 | Ht 64.0 in | Wt 277.0 lb

## 2012-06-09 DIAGNOSIS — F32A Depression, unspecified: Secondary | ICD-10-CM

## 2012-06-09 DIAGNOSIS — Z3480 Encounter for supervision of other normal pregnancy, unspecified trimester: Secondary | ICD-10-CM

## 2012-06-09 LAB — POCT URINALYSIS DIPSTICK
Bilirubin,Ur: NEGATIVE
Blood,UA POCT: NEGATIVE
Glucose,UA POCT: NORMAL
Ketones,UA POCT: NEGATIVE
Lot #: 22041202
Nitrite,UA POCT: NEGATIVE
PH,UA POCT: 6 (ref 5–8)
Specific gravity,UA POCT: 1.02 (ref 1.002–1.03)
Urobilinogen,UA: NORMAL

## 2012-06-09 MED ORDER — ACETAMINOPHEN 650 MG PO TBCR *I*
650.0000 mg | ORAL_TABLET | Freq: Three times a day (TID) | ORAL | Status: DC | PRN
Start: 2012-06-09 — End: 2012-09-09

## 2012-06-09 NOTE — Progress Notes (Signed)
OB CHECK at [redacted]w[redacted]d     SUBJECTIVE  Jenna [name] presents for OB check at [redacted]w[redacted]d.  Has had a headache for 1 week. Has not taken any tylenol. Denies any visual changes or epigastric pain.  Has [name] this afternoon. :its a boy" was hoping for a girl.  Denies LOF, VB, ctx.  +FM    OBJECTIVE  Filed Vitals:    06/09/12 1531   BP: 121/84   Height: 5\' 4"  (1.626 m)   Weight: 277 lb (125.646 kg)     NAD  ABD: soft non-tender  FH: 19 cm  FHR: 150's    ASSESSMENT/PLAN  26 y.o. [id number] at 106w0d by early ultrasound with pregnancy complicated by headaches    Patient Active Problem List   Diagnosis Code   . Status post cesarean delivery V45.89   . Hidradenitis 705.83   . Tobacco abuse 305.1   . Migraine Headache 346.90   . Allergic Rhinitis 477.9   . Obesity 278.00   . Cocaine abuse, in remission 305.63   . Supervision of other normal pregnancy V22.1   . Depression 311     Rx for Tylenol 650 mg sent to her pharmacy. Instructions for use. To call if no relief of headaches  Increase fluids  To lab for PLT and CMP.  Reviewed pregnancy warnings and comfort measures  Reviewed signs of pre-eclamsia  Ordered Repeat anatomic scan, need better view of heart    Return to clinic in 4 weeks.

## 2012-06-13 NOTE — Progress Notes (Signed)
SW Note:  Encounter created by mistake.  Denny Peon. [place], LMSW, [phone], beeper [phone]

## 2012-06-13 NOTE — Progress Notes (Signed)
SW note:  Patient was scheduled for SW but left without being seen. Will request patient be scheduled with SW at future OB appointment.  Denny Peon. [place], LMSW, [phone], beeper [phone]

## 2012-06-14 ENCOUNTER — Emergency Department: Admission: EM | Admit: 2012-06-14 | Payer: Self-pay | Source: Ambulatory Visit

## 2012-06-14 ENCOUNTER — Encounter: Payer: Self-pay | Admitting: Obstetrics and Gynecology

## 2012-06-14 ENCOUNTER — Observation Stay
Admit: 2012-06-14 | Disposition: A | Discharge status: Home / Self Care | Payer: Self-pay | Source: Ambulatory Visit | Attending: Obstetrics and Gynecology | Admitting: Obstetrics and Gynecology

## 2012-06-14 ENCOUNTER — Inpatient Hospital Stay
Admission: EM | Admit: 2012-06-14 | Disposition: A | Discharge status: Home / Self Care | Payer: Self-pay | Source: Ambulatory Visit | Attending: Psychiatry | Admitting: Psychiatry

## 2012-06-14 DIAGNOSIS — IMO0002 Reserved for concepts with insufficient information to code with codable children: Secondary | ICD-10-CM | POA: Diagnosis present

## 2012-06-14 LAB — DRUG SCREEN CHEMICAL DEPENDENCY, URINE
Amphetamine,UR: NEGATIVE
Benzodiazepinen,UR: NEGATIVE
Cocaine/Metab,UR: POSITIVE
Opiates,UR: NEGATIVE
THC Metabolite,UR: NEGATIVE

## 2012-06-14 MED ORDER — CALCIUM CARBONATE ANTACID 500 MG PO CHEW *I*
500.0000 mg | CHEWABLE_TABLET | Freq: Once | ORAL | Status: AC
Start: 2012-06-14 — End: 2012-06-14
  Administered 2012-06-14: 500 mg via ORAL
  Filled 2012-06-14: qty 1

## 2012-06-14 MED ORDER — ACETAMINOPHEN 325 MG PO TABS *I*
650.0000 mg | ORAL_TABLET | Freq: Once | ORAL | Status: AC
Start: 2012-06-14 — End: 2012-06-14
  Administered 2012-06-14: 650 mg via ORAL
  Filled 2012-06-14: qty 2

## 2012-06-14 MED ORDER — NICOTINE POLACRILEX 2 MG MT GUM *I*
2.0000 mg | CHEWING_GUM | Freq: Once | OROMUCOSAL | Status: AC
Start: 2012-06-14 — End: 2012-06-14
  Administered 2012-06-14: 2 mg via ORAL
  Filled 2012-06-14 (×2): qty 1
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
## 2012-06-14 NOTE — ED Notes (Signed)
Pt was found to have a crack pipe on her person.  Item removed for safety and disposed of by CPEP staff.  No substances found.
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
## 2012-06-14 NOTE — Progress Notes (Signed)
Pt taken to ED for psych evaluation as major c/o today was suicidal ideation.  Pt unable to urine sample.  PO fluids given as patient said they will ask for a urine sample in psych.
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
## 2012-06-15 ENCOUNTER — Telehealth: Payer: Self-pay

## 2012-06-15 DIAGNOSIS — F39 Unspecified mood [affective] disorder: Secondary | ICD-10-CM

## 2012-06-15 HISTORY — DX: Unspecified mood (affective) disorder: F39

## 2012-06-15 LAB — COMPREHENSIVE METABOLIC PANEL
ALT: 11 U/L (ref 0–35)
AST: 11 U/L (ref 0–35)
Albumin: 3.5 g/dL (ref 3.5–5.2)
Alk Phos: 63 U/L (ref 35–105)
Anion Gap: 11 (ref 7–16)
Bilirubin,Total: 0.1 mg/dL (ref 0.0–1.2)
CO2: 23 mmol/L (ref 20–28)
Calcium: 9.3 mg/dL (ref 8.8–10.2)
Chloride: 103 mmol/L (ref 96–108)
Creatinine: 0.68 mg/dL (ref 0.51–0.95)
GFR,Black: 139 *
GFR,Caucasian: 121 *
Glucose: 108 mg/dL — ABNORMAL HIGH (ref 60–99)
Lab: 8 mg/dL (ref 6–20)
Potassium: 4.2 mmol/L (ref 3.3–5.1)
Sodium: 137 mmol/L (ref 133–145)
Total Protein: 6 g/dL — ABNORMAL LOW (ref 6.3–7.7)

## 2012-06-15 LAB — TSH: TSH: 0.86 u[IU]/mL (ref 0.27–4.20)

## 2012-06-15 LAB — COCAINE, URINE, CONFIRMATION: Confirm COC/METAB: POSITIVE

## 2012-06-15 LAB — CBC AND DIFFERENTIAL
Baso # K/uL: 0 10*3/uL (ref 0.0–0.1)
Basophil %: 0.1 % (ref 0.1–1.2)
Eos # K/uL: 0.2 10*3/uL (ref 0.0–0.4)
Eosinophil %: 1.4 % (ref 0.7–5.8)
Hematocrit: 32 % — ABNORMAL LOW (ref 34–45)
Hemoglobin: 10.2 g/dL — ABNORMAL LOW (ref 11.2–15.7)
Lymph # K/uL: 2.7 10*3/uL (ref 1.2–3.7)
Lymphocyte %: 22 % (ref 19.3–51.7)
MCV: 95 fL (ref 79–95)
Mono # K/uL: 0.9 10*3/uL (ref 0.2–0.9)
Monocyte %: 7.3 % (ref 4.7–12.5)
Neut # K/uL: 8.5 10*3/uL — ABNORMAL HIGH (ref 1.6–6.1)
Platelets: 245 10*3/uL (ref 160–370)
RBC: 3.4 MIL/uL — ABNORMAL LOW (ref 3.9–5.2)
RDW: 14.4 % (ref 11.7–14.4)
Seg Neut %: 69.2 % (ref 34.0–71.1)
WBC: 12.2 10*3/uL — ABNORMAL HIGH (ref 4.0–10.0)

## 2012-06-15 LAB — CHLAMYDIA PLASMID DNA AMPLIFICATION

## 2012-06-15 MED ORDER — HALOPERIDOL 2 MG PO TABS *I*
2.0000 mg | ORAL_TABLET | Freq: Two times a day (BID) | ORAL | Status: DC | PRN
Start: 2012-06-15 — End: 2012-06-21
  Administered 2012-06-16 – 2012-06-18 (×2): 2 mg via ORAL
  Filled 2012-06-15 (×2): qty 1

## 2012-06-15 MED ORDER — ONDANSETRON 4 MG PO TBDP *I*
4.0000 mg | ORAL_TABLET | Freq: Three times a day (TID) | ORAL | Status: DC | PRN
Start: 2012-06-15 — End: 2012-06-26
  Filled 2012-06-15 (×23): qty 1

## 2012-06-15 MED ORDER — TRAZODONE HCL 50 MG PO TABS *I*
50.0000 mg | ORAL_TABLET | Freq: Every evening | ORAL | Status: DC
Start: 2012-06-15 — End: 2012-06-20
  Administered 2012-06-15 – 2012-06-19 (×5): 50 mg via ORAL
  Filled 2012-06-15 (×5): qty 1

## 2012-06-15 MED ORDER — CLINDAMYCIN PHOSPHATE 1 % EX SOLN *I*
Freq: Two times a day (BID) | CUTANEOUS | Status: DC
Start: 2012-06-15 — End: 2012-06-26
  Filled 2012-06-15: qty 60

## 2012-06-15 MED ORDER — PRENATAL PLUS IRON 29-1 MG PO TABS *I*
1.0000 | ORAL_TABLET | Freq: Every day | ORAL | Status: DC
Start: 2012-06-15 — End: 2012-06-26
  Administered 2012-06-15 – 2012-06-26 (×12): 1 via ORAL
  Filled 2012-06-15 (×14): qty 1

## 2012-06-15 MED ORDER — ACETAMINOPHEN 325 MG PO TABS *I*
650.0000 mg | ORAL_TABLET | ORAL | Status: DC | PRN
Start: 2012-06-15 — End: 2012-06-26
  Administered 2012-06-15 – 2012-06-24 (×11): 650 mg via ORAL

## 2012-06-15 MED ORDER — HALOPERIDOL 5 MG PO TABS *I*
5.0000 mg | ORAL_TABLET | Freq: Every evening | ORAL | Status: DC
Start: 2012-06-15 — End: 2012-06-21
  Administered 2012-06-15 – 2012-06-20 (×6): 5 mg via ORAL
  Filled 2012-06-15 (×6): qty 1

## 2012-06-15 MED ORDER — DOCUSATE SODIUM 100 MG PO CAPS *I*
100.0000 mg | ORAL_CAPSULE | Freq: Two times a day (BID) | ORAL | Status: DC | PRN
Start: 2012-06-15 — End: 2012-06-26

## 2012-06-15 MED ORDER — NICOTINE POLACRILEX 2 MG MT GUM *I*
2.0000 mg | CHEWING_GUM | OROMUCOSAL | Status: DC | PRN
Start: 2012-06-15 — End: 2012-06-26
  Administered 2012-06-15 – 2012-06-25 (×10): 2 mg via ORAL
  Filled 2012-06-15 (×12): qty 1

## 2012-06-15 NOTE — ED Notes (Signed)
Patient slept in gerichair in waiting room most of shift.  Pleasant on contact.  Q 15 minute safety checks maintained.

## 2012-06-15 NOTE — Plan of Care (Addendum)
Pt very tired, spent most of the evening in bed.  Urine obtained and sent.  She said her mood is depressed, and she feels safe in the hospital but has thoughts of suicide off and on.  She wants to deal with them by sleeping.  Did not want to engage further, said she doesn't like to talk and she already told too many people her story.   Pt up and wanted to shower at 2130, took her HS meds. Report given to Rocky Link, RN.

## 2012-06-15 NOTE — Plan of Care (Signed)
Encourage pt to attend therapeutic activities. Assess mood, interactions and functioning level. Provide supportive contacts.    Niccole Witthuhn    Activities Therapist

## 2012-06-15 NOTE — Plan of Care (Signed)
Care Plan Goals      Risk for Suicide      . Patient Will Be Safe And Free From Injury Throughout Hospitalization Progressing towards goal      . Patient Will Identify 2 Alternative Ways Of Dealing With Stress And Emotional Problems By Day 3 Progressing towards goal      . Patient Will Express Sense Of Hope And Future Orientation By Discharge Progressing towards goal      . Patient Will Use Their Identified Coping Skills By Discharge Goal not met        Pt admitted to [address] from CPEP at 10:30 via wheelchair. Given orientation to unit, food/fluids, and shown her room. Cooperative and friendly on contacts. Pt denies current SI/HI/AVH. Did have plan to cut her wrists prior to admission but denies current plan to harm self. States that she is "overwhelmed" but feels safe in the hospital environment.     Admission is complete. Blood work drawn and sent to lab. Pt has urine cup in her room, label on blood board. Expressed interest in getting additional lab work done to test for STIs.     Pt states she will report any pregnancy related issues to nursing staff (fetal movement, discharge, cramping, ect.).    C/o pain 8/10 in her arm pits r/t swelling of her sweat glands with minimal relief from Tylenol. Plans to speak with treatment team about receiving an antibiotic for this swelling as she feels her glands may be infected.     Leotis Shames, RN
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
## 2012-06-15 NOTE — Telephone Encounter (Signed)
Message copied by Clayborn Heron on Thu Jun 15, 2012  3:46 PM  ------       Message from: Elissa Hefty       Created: Thu Jun 15, 2012  2:19 PM         I wasn't certain who to notify - but it looks like this patient is assigned to your [facility] team.               I got a phone call from Dr. [name] Reil in CPEP to let me know that this person was being admitted.  Sounds like she was suicidal and had a cocaine relapse.  She's been coming to [facility] for her prenatal care.         ------
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
## 2012-06-16 DIAGNOSIS — R45851 Suicidal ideations: Secondary | ICD-10-CM | POA: Diagnosis present

## 2012-06-16 MED ORDER — MELATONIN 3 MG PO TABS *I*
3.0000 mg | ORAL_TABLET | Freq: Every evening | ORAL | Status: DC
Start: 2012-06-16 — End: 2012-06-21
  Administered 2012-06-16 – 2012-06-20 (×5): 3 mg via ORAL
  Filled 2012-06-16 (×6): qty 1
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
## 2012-06-16 NOTE — Progress Notes (Signed)
Social Work Note: Late Entry      -Follow up    Prior to [employer] spoke with Jenna [name] who is having trouble meeting patient for an intake. Writer planned to call Jenna Bandy with patient during SW appointment to help setup a [facility] intake.     Patient was accompanied to her appointment by [name] today.  Jenna [name] was excited reporting she had a interview with [employer] setup. Patient reported that she and [name] were working on preparing for the baby. Writer called Jenna [name] and pt was able to setup an intake appointment for the following week. Jenna [name] is reporting she has continued to attend therapy several times a week and has been working hard towards getting her children returned to her care. Patient's children continue to live with her aunt.     Plan:  Pt is scheduled to meet with Jenna [name] next week for a intake  SW to follow up  [name], LMSW  [phone], [phone]

## 2012-06-16 NOTE — H&P (Signed)
Attending Addendum:    I saw and evaluated the patient on 06/15/12.    I agree with the resident's findings and plan of care as documented above.   Case discussed in multidisciplinary care coordination meeting with the resident, nursing and social work.    Patient present tearful, admitted because of plan to cut her wrists.  States that she can stay safe in the hospital.  Agree with plan to start on Haldol and obtain records to determine history of previous manic episodes.    Melene Plan, MD
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
## 2012-06-16 NOTE — Plan of Care (Signed)
Care Plan Goals      Risk for Suicide      . Patient Will Identify 2 Alternative Ways Of Dealing With Stress And Emotional Problems By Day 3 Goal not met      . Patient Will Use Their Identified Coping Skills By Discharge Goal not met      . Patient Will Express Sense Of Hope And Future Orientation By Discharge Goal not met      . Patient Will Be Safe And Free From Injury Throughout Hospitalization Maintaining        Pt mostly seclusive to her room this shift coming out for meals only. Continues to complain of underarm pain with minimal relief from PRN Tylenol. Pleasant and cooperative on contacts. Encouraged to go to groups-- pt stated "I will wake up in five minutes!" but then would sleep through groups. States she feels more tired than usual which is why she has been spending so much time in bed. Please encourage pt to walk in hallways on evenings. Continues to state she feels safe in the hospital.    Leotis Shames, RN

## 2012-06-17 LAB — TP/CREATININE RATIO,UR

## 2012-06-17 LAB — PROTEIN, URINE: Protein,UR: <2 mg/dL (ref 0–11)

## 2012-06-17 LAB — CREATININE, URINE: Creatinine,UR: 73 mg/dL (ref 20–300)

## 2012-06-17 MED ORDER — SALINE NASAL SPRAY 0.65 % NA SOLN *WRAPPED*
2.0000 | NASAL | Status: DC | PRN
Start: 2012-06-17 — End: 2012-06-26
  Administered 2012-06-17 – 2012-06-23 (×3): 2 via NASAL
  Filled 2012-06-17: qty 44

## 2012-06-17 MED ORDER — EUCERIN EX CREA *I*
TOPICAL_CREAM | CUTANEOUS | Status: DC | PRN
Start: 2012-06-17 — End: 2012-06-26
  Filled 2012-06-17: qty 113

## 2012-06-17 MED ORDER — CALCIUM CARBONATE ANTACID 500 MG PO CHEW *I*
500.0000 mg | CHEWABLE_TABLET | Freq: Once | ORAL | Status: AC
Start: 2012-06-17 — End: 2012-06-18
  Filled 2012-06-17: qty 1

## 2012-06-17 NOTE — Plan of Care (Addendum)
Care Plan Goals      Risk for Suicide      . Patient Will Express Sense Of Hope And Future Orientation By Discharge Progressing towards goal      . Patient Will Identify 2 Alternative Ways Of Dealing With Stress And Emotional Problems By Day 3 Goal not met      . Patient Will Use Their Identified Coping Skills By Discharge Goal not met      . Patient Will Be Safe And Free From Injury Throughout Hospitalization Maintaining          Pt. Spent most of shift in room except for meals. In AM pt. Had two elevated BP readings: 147/89, 140/92. OB/Labor deck was notified since pt. Has a history of preeclampsia with prior pregnancies. Their recommendation was to continue to monitor BP and collect urine sample to test protein/creatinine. Pt. Denied any pain however did c/o of feeling "dry" and congested. Ate and drank well. Is expecting a visit from aunt later in the day. Denied any safety concerns.    Russ Halo, RN

## 2012-06-17 NOTE — Plan of Care (Signed)
Report given to Adelina Mings:  Patient was frustrated because she accidentally overslept through dinner.  Given a late dinner.  Patient admits to a depressed mood with chronic fatigue.  Denies SI.  Mostly seclusive to room.
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
## 2012-06-17 NOTE — Progress Notes (Signed)
Attending Addendum:    I saw and evaluated the patient on 06/16/12.        I agree with the resident's findings and plan of care as documented above.   Case discussed in multidisciplinary care coordination meeting with the resident, nursing and social work.    Notified today that patient's BP was slightly elevated and that she had a history of preeclampsia.  RN contacted OB and they advised to call them back should BP>150/90, and to collect a urine sample for creatinine and protein.    Melene Plan, MD

## 2012-06-18 NOTE — Plan of Care (Signed)
Risk for Suicide    . Patient Will Be Safe And Free From Injury Throughout Hospitalization Maintaining          Risk for Suicide    . Patient Will Identify 2 Alternative Ways Of Dealing With Stress And Emotional Problems By Day 3 Progressing towards goal    . Patient Will Use Their Identified Coping Skills By Discharge Progressing towards goal    . Patient Will Express Sense Of Hope And Future Orientation By Discharge Progressing towards goal          Pt present in milieu watching tv and conversing with peers. Ate well at meals and denies any discomfort. Pt attempted group but was unable to tolerate for long and left. States she is fine and wants to leave. Denies HI/AVH, but endorses vague SI with no intent. Precautions maintained.

## 2012-06-19 MED ORDER — SERTRALINE HCL 50 MG PO TABS *I*
25.0000 mg | ORAL_TABLET | Freq: Every evening | ORAL | Status: DC
Start: 2012-06-19 — End: 2012-06-21
  Administered 2012-06-19 – 2012-06-20 (×2): 25 mg via ORAL
  Filled 2012-06-19 (×2): qty 1
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
## 2012-06-19 NOTE — Plan of Care (Signed)
Mostly seclusive to room.  Ate dinner and a snack but continues to c/o extreme fatique.  States mood as slightly depressed.  Denies SI.

## 2012-06-19 NOTE — Plan of Care (Signed)
Care Plan Goals      Risk for Suicide      . Patient Will Identify 2 Alternative Ways Of Dealing With Stress And Emotional Problems By Day 3 Progressing towards goal      . Patient Will Use Their Identified Coping Skills By Discharge Progressing towards goal      . Patient Will Express Sense Of Hope And Future Orientation By Discharge Progressing towards goal      . Patient Will Be Safe And Free From Injury Throughout Hospitalization Maintaining          Pt. Was out in the milieu more today. Slept at some times throughout the day. Woke up and told writer she cannot sleep well due to her bed being uncomfortable. [name] it will not lay flat. Her Blood pressure was within normal limits during the day. More sociable with peers. Denies any current thoughts of hurting self. Pt. Is concerned she will be missing an OB appt. this week, would like an OB consult to see how baby is doing. Report given to Gaspar Skeeters, RN.     Russ Halo, RN
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
## 2012-06-19 NOTE — Plan of Care (Signed)
Risk for Suicide    . Patient Will Be Safe And Free From Injury Throughout Hospitalization Maintaining          Risk for Suicide    . Patient Will Identify 2 Alternative Ways Of Dealing With Stress And Emotional Problems By Day 3 Progressing towards goal    . Patient Will Use Their Identified Coping Skills By Discharge Progressing towards goal    . Patient Will Express Sense Of Hope And Future Orientation By Discharge Progressing towards goal            Pt present in milieu for most of shift watching tv. Joking about her pregnancy and asking other staff and peers if they want her baby, saying they can carry it for her and everything. She accepted all meds but made several comments that if the meds we give her cause harm to her baby that she will be bringing the back as she "does not want any deformed child". Went to bed early after receiving HS Meds. Denies SI/HI/AVH. Precautions maintained.

## 2012-06-19 NOTE — Progress Notes (Signed)
Attending Addendum:    I saw and evaluated the patient on 06/19/2012.        I agree with the resident's findings and plan of care as documented above.   Case discussed in multidisciplinary care coordination meeting with the resident, nursing and social work.    Patient presented with SI in the setting of many severe stressors.  ? Dx of bipolar disorder.  Will continue Haldol.  Will also start SSRI to target obvious depressive symptoms.  Will monitor for any signs of mania.      Melene Plan, MD

## 2012-06-20 MED ORDER — TRAZODONE HCL 50 MG PO TABS *I*
50.0000 mg | ORAL_TABLET | Freq: Every evening | ORAL | Status: DC | PRN
Start: 2012-06-20 — End: 2012-06-26
  Administered 2012-06-20 – 2012-06-24 (×5): 50 mg via ORAL
  Filled 2012-06-20 (×5): qty 1

## 2012-06-20 NOTE — Plan of Care (Signed)
Risk for Suicide    . Patient Will Be Safe And Free From Injury Throughout Hospitalization Maintaining          Risk for Suicide    . Patient Will Identify 2 Alternative Ways Of Dealing With Stress And Emotional Problems By Day 3 Progressing towards goal    . Patient Will Use Their Identified Coping Skills By Discharge Progressing towards goal    . Patient Will Express Sense Of Hope And Future Orientation By Discharge Progressing towards goal            Pt mood euthymic this shift and was visible in milieu most of the day. Accepted vitamin, worked with a Comptroller until lunch time and then relaxed in her room for a while. No negative comments made about her baby, and is excited because her aunt may come and visit this evening. Denies current SI/HI/AVH. Precautions maintained.
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
## 2012-06-20 NOTE — Plan of Care (Signed)
Risk for Suicide    . Patient Will Be Safe And Free From Injury Throughout Hospitalization Maintaining          Risk for Suicide    . Patient Will Identify 2 Alternative Ways Of Dealing With Stress And Emotional Problems By Day 3 Progressing towards goal    . Patient Will Use Their Identified Coping Skills By Discharge Progressing towards goal    . Patient Will Express Sense Of Hope And Future Orientation By Discharge Progressing towards goal            Pt mood elevated this evening and was quite animated. Perseverated over food, asking staff to go buy her fried chicken and potato chips. Not easily redirected however was able to joke with staff about it instead of getting angry. Spent much of the evening conversing with peers and watching tv in alcove. Accepted all meds easily and denies SI/HI/AVH. Precautions maintained.
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
## 2012-06-21 MED ORDER — MELATONIN 3 MG PO TABS *I*
6.0000 mg | ORAL_TABLET | Freq: Every evening | ORAL | Status: DC
Start: 2012-06-21 — End: 2012-06-26
  Administered 2012-06-21 – 2012-06-25 (×5): 6 mg via ORAL
  Filled 2012-06-21 (×6): qty 2

## 2012-06-21 MED ORDER — GUAIFENESIN 100 MG/5ML PO SYRP
100.0000 mg | ORAL_SOLUTION | Freq: Three times a day (TID) | ORAL | Status: DC | PRN
Start: 2012-06-21 — End: 2012-06-22
  Administered 2012-06-21 – 2012-06-22 (×3): 100 mg via ORAL

## 2012-06-21 MED ORDER — SERTRALINE HCL 50 MG PO TABS *I*
50.0000 mg | ORAL_TABLET | Freq: Every evening | ORAL | Status: DC
Start: 2012-06-21 — End: 2012-06-26
  Administered 2012-06-21 – 2012-06-25 (×5): 50 mg via ORAL
  Filled 2012-06-21 (×5): qty 1

## 2012-06-21 MED ORDER — LORATADINE 10 MG PO TABS *I*
10.0000 mg | ORAL_TABLET | Freq: Every day | ORAL | Status: DC | PRN
Start: 2012-06-21 — End: 2012-06-26
  Administered 2012-06-22 – 2012-06-25 (×5): 10 mg via ORAL
  Filled 2012-06-21 (×7): qty 1
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
## 2012-06-21 NOTE — Progress Notes (Signed)
Attending Addendum:    I saw and evaluated the patient on 06/21/2012.    Patient continues to show improvement in mood.  Sleep is much improved as well.    Discussed with her that we will discontinue Haldol as there is no evidence of bipolar illness (records show mania occuring only during substance use) and we will like to avoid unnecessary medications, especially while she is pregnant.    I agree with the resident's findings and plan of care as documented above.   Case discussed in multidisciplinary care coordination meeting with the resident, nursing and social work.        Melene Plan, MD

## 2012-06-21 NOTE — Plan of Care (Signed)
Risk for Suicide    . Patient Will Be Safe And Free From Injury Throughout Hospitalization Maintaining          Risk for Suicide    . Patient Will Identify 2 Alternative Ways Of Dealing With Stress And Emotional Problems By Day 3 Progressing towards goal    . Patient Will Use Their Identified Coping Skills By Discharge Progressing towards goal    . Patient Will Express Sense Of Hope And Future Orientation By Discharge Progressing towards goal            Pt seclusive to room today napping throughout shift. Ate well at breakfast, made a phone call and accepted meds. Denies all concerns and just wanted to sleep, precautions maintained.

## 2012-06-22 MED ORDER — GUAIFENESIN 100 MG/5ML PO SYRP
200.0000 mg | ORAL_SOLUTION | Freq: Three times a day (TID) | ORAL | Status: DC | PRN
Start: 2012-06-22 — End: 2012-06-23
  Administered 2012-06-22 – 2012-06-23 (×2): 200 mg via ORAL

## 2012-06-22 MED ORDER — GUAIFENESIN 100 MG/5ML PO SYRP
100.0000 mg | ORAL_SOLUTION | Freq: Once | ORAL | Status: AC
Start: 2012-06-22 — End: 2012-06-22
  Administered 2012-06-22: 100 mg via ORAL

## 2012-06-22 NOTE — Plan of Care (Signed)
Risk for Suicide    . Patient Will Be Safe And Free From Injury Throughout Hospitalization Maintaining          Risk for Suicide    . Patient Will Identify 2 Alternative Ways Of Dealing With Stress And Emotional Problems By Day 3 Progressing towards goal    . Patient Will Use Their Identified Coping Skills By Discharge Progressing towards goal        Report given to Owens Shark RN... Patient visible and social in milieu, although at times her behaviors bordered on intrusiveness. Mood and affect remains elevated , speech loud, pressured and excessive. However, patient denies physical/safety related issues or concerns and says her thoughts are clear and without delusional/psychotic content. States she is expecting and looking forward to being discharged tomorrow. Patient's vital signs, taken this morning, were stable and WNL.
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
## 2012-06-22 NOTE — Plan of Care (Signed)
Risk for Suicide    . Patient Will Be Safe And Free From Injury Throughout Hospitalization Maintaining          Risk for Suicide    . Patient Will Identify 2 Alternative Ways Of Dealing With Stress And Emotional Problems By Day 3 Progressing towards goal    . Patient Will Use Their Identified Coping Skills By Discharge Progressing towards goal    . Patient Will Express Sense Of Hope And Future Orientation By Discharge Progressing towards goal        Pt visible within milieu; Mood and Affect; elevated, hyper verbal, loud and friendly among peers. Pt responded well to reminders to lower voice. C/o congestion and cough. PRN guaifenesin received with positive effect. Vital signs stable during shift. Pt requested and received HS med's except topical Clindamycin.  PRN Trazodone received. Denied SI.
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
## 2012-06-23 ENCOUNTER — Telehealth: Payer: Self-pay

## 2012-06-23 MED ORDER — PRENATAL PLUS IRON 29-1 MG PO TABS *I*
1.0000 | ORAL_TABLET | Freq: Every day | ORAL | Status: DC
Start: 2012-06-23 — End: 2012-07-25

## 2012-06-23 MED ORDER — LORATADINE 10 MG PO TABS *I*
10.0000 mg | ORAL_TABLET | Freq: Every day | ORAL | Status: DC | PRN
Start: 2012-06-23 — End: 2012-07-25

## 2012-06-23 MED ORDER — CLINDAMYCIN PHOSPHATE 1 % EX SOLN *I*
Freq: Two times a day (BID) | CUTANEOUS | Status: DC
Start: 2012-06-23 — End: 2012-07-25

## 2012-06-23 MED ORDER — TRAZODONE HCL 50 MG PO TABS *I*
50.0000 mg | ORAL_TABLET | Freq: Every evening | ORAL | Status: DC | PRN
Start: 2012-06-23 — End: 2012-07-25

## 2012-06-23 MED ORDER — SERTRALINE HCL 50 MG PO TABS *I*
50.0000 mg | ORAL_TABLET | Freq: Every evening | ORAL | Status: DC
Start: 2012-06-23 — End: 2012-07-25

## 2012-06-23 MED ORDER — MELATONIN 3 MG PO TABS *I*
6.0000 mg | ORAL_TABLET | Freq: Every evening | ORAL | Status: DC
Start: 2012-06-23 — End: 2012-07-25

## 2012-06-23 MED ORDER — GUAIFENESIN 600 MG PO TB12 *I*
600.0000 mg | ORAL_TABLET | Freq: Two times a day (BID) | ORAL | Status: AC | PRN
Start: 2012-06-23 — End: 2012-06-28

## 2012-06-23 MED ORDER — GUAIFENESIN 600 MG PO TB12 *I*
600.0000 mg | ORAL_TABLET | Freq: Two times a day (BID) | ORAL | Status: DC | PRN
Start: 2012-06-23 — End: 2012-06-26
  Administered 2012-06-23 – 2012-06-25 (×4): 600 mg via ORAL
  Filled 2012-06-23 (×7): qty 1
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
## 2012-06-23 NOTE — Telephone Encounter (Signed)
Writer received telephone call from Elissa Hefty, SW. Pt was admitted to psych on 06/13/12 for suicidal ideation and cocaine.. Per L+D residents, pt should be transferred to [facility]. Appointment was booked per L+D residents. Will forward to [facility] so pt can be added to [facility] census.

## 2012-06-23 NOTE — Progress Notes (Signed)
Attending Addendum:    I saw and evaluated the patient.    Agree with plan to continue to hold Haldol as patient is pregnant.  Past manic symptoms occurred in the setting of substance use.  Patient educated about mania and she was told to contact her current mental health providers should this occur in the future.    I agree with the resident's findings and plan of care as documented above.   Case discussed in multidisciplinary care coordination meeting with the resident, nursing and social work.        Melene Plan, MD
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
## 2012-06-23 NOTE — Progress Notes (Signed)
Social Work Note -   Received phone call from [employer], LMSW.  Pt's anticipated discharge from inpt psychiatry unit is Monday, 4/14.  Ms. Mertha Finders is in the process of connecting pt with outpt MH and substance abuse tx, reports that pt will be going to emergency housing after her discharge.  Pt discussed with inpt OB team.  Pt to follow-up in [facility] for prenatal care.  Plan -   1.Pt scheduled for [facility] on 4/17 at 1:30pm.  SW to follow-up with pt at this time as well.    Elissa Hefty, LMSW [phone]
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
## 2012-06-23 NOTE — Plan of Care (Signed)
Risk for Suicide    . Patient Will Be Safe And Free From Injury Throughout Hospitalization Maintaining          Risk for Suicide    . Patient Will Identify 2 Alternative Ways Of Dealing With Stress And Emotional Problems By Day 3 Progressing towards goal    . Patient Will Use Their Identified Coping Skills By Discharge Progressing towards goal    . Patient Will Express Sense Of Hope And Future Orientation By Discharge Progressing towards goal        Pt visible and social with select peers; C/o congestion and cough. PRN Claritin, Tylenol and Guaifenesin received with positive effect. Mood and Affect; appropriate and stable with full range. Pt is hopeful to be discharge from unit. Med adherent,  Denied safety concern.

## 2012-06-24 NOTE — Progress Notes (Signed)
Attending Addendum:    I saw and evaluated the patient.    Patient tolerating discontinuation of Haldol.  Mood much improved.  Plan to observe over the weekend and discharge on Monday if no concerns arise.    I agree with the resident's findings and plan of care as documented above.   Case discussed in multidisciplinary care coordination meeting with the resident, nursing and social work.        Melene Plan, MD

## 2012-06-24 NOTE — Plan of Care (Signed)
Risk for Suicide    . Patient Will Be Safe And Free From Injury Throughout Hospitalization Maintaining          Risk for Suicide    . Patient Will Identify 2 Alternative Ways Of Dealing With Stress And Emotional Problems By Day 3 Progressing towards goal    . Patient Will Use Their Identified Coping Skills By Discharge Progressing towards goal    . Patient Will Express Sense Of Hope And Future Orientation By Discharge Progressing towards goal        Pt visible in milieu and social with select peers; At times, displaying elevated mood with friendly and loud interactions during shift. Pt visited with social worker and continues hopeful for discharge from unit. C/o of congestion, though less severe with use of PRN's medications.  Med adherent, Denied safety concerns.

## 2012-06-24 NOTE — Plan of Care (Signed)
Risk for Suicide    . Patient Will Be Safe And Free From Injury Throughout Hospitalization Maintaining          Risk for Suicide    . Patient Will Identify 2 Alternative Ways Of Dealing With Stress And Emotional Problems By Day 3 Progressing towards goal        Patient visible and social in milieu, although at times loud with her behaviors bordering on intrusiveness. Denies current safety issues/concerns and says she is hoping for discharge this Monday. Report given to Hulan Saas RN.

## 2012-06-25 NOTE — Plan of Care (Signed)
Risk for Suicide    . Patient Will Be Safe And Free From Injury Throughout Hospitalization Maintaining          Risk for Suicide    . Patient Will Identify 2 Alternative Ways Of Dealing With Stress And Emotional Problems By Day 3 Progressing towards goal        Report given to Hulan Saas RN.Marland KitchenMarland KitchenPatient pleasant, polite and receptive to supportive contacts. In milieu ad lib, at times still loud, but was cooperative with reminders re: lowering voice volume. Patient continues to deny safety related issues/concerns and stated she is still expecting and looking  forward to " leaving tomorrow. " Continues to c/o cold like sxs, Patient's vital signs are stable and she currently does not appear distressed physically.

## 2012-06-25 NOTE — Plan of Care (Signed)
Risk for Suicide    . Patient Will Be Safe And Free From Injury Throughout Hospitalization Maintaining          Risk for Suicide    . Patient Will Identify 2 Alternative Ways Of Dealing With Stress And Emotional Problems By Day 3 Progressing towards goal    . Patient Will Use Their Identified Coping Skills By Discharge Progressing towards goal    . Patient Will Express Sense Of Hope And Future Orientation By Discharge Progressing towards goal        Pt continues to make gains; Visible and social with select peers while demonstrating full range of affect. At times, loud and hyper verbal though cooperative with reminders to lower voice while indoors. Pt was "happy" to visit with aunt and "two sons". Pt continues hopeful to be discharged from unit. Vital signs stable and WNL. PRN Trazodone and HS med's received easily except for topical Clindamycin. Denied SI.

## 2012-06-25 NOTE — Plan of Care (Addendum)
Care Plan Goals      Risk for Suicide      . Patient Will Identify 2 Alternative Ways Of Dealing With Stress And Emotional Problems By Day 3 Progressing towards goal      . Patient Will Use Their Identified Coping Skills By Discharge Progressing towards goal      . Patient Will Express Sense Of Hope And Future Orientation By Discharge Progressing towards goal      . Patient Will Be Safe And Free From Injury Throughout Hospitalization Maintaining          Pt. Was in high spirits throughout the shift. Out in the milieu for most of the evening with peers. Receptive to reminders about volume of her voice and using appropriate language. Expressed being very excited about discharge tomorrow. Was given pt. Survey. Denied any thoughts of hurting self. No physical concerns. Vital Signs WNL.    Russ Halo, RN
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
## 2012-06-26 NOTE — Comprehensive Assessment (Addendum)
06/14/12 0000   Discharge Planning   Reason for Hospitalization Danger to Self   Follow Up Arrangements Made See discharge instructions   Next level of care has access to [facility] EMR No   Discharge Continuing Care Plan Transmitted to [facility] Provider on: 06/26/12   Discharge Information Faxed To: Mental Health Provider;Other (comment)  (OB/GYN, Chemical dependency)   Referrals Made Emergency Housing ;Mental health;Chemical Dependency Treatment    Interventions Collateral contacted;Housing obtained;Family/significant other support     Patient discharging today via [place] to [employer] emergency housing.  She was provided with 2 bus passes to assist with getting to her destination after DSS. Writer reviewed the follow up at [facility], [facility], and Ob/GYN.  She is also familiar with Medicaid transportation, and was provided with the number to contact when she confirms housing.  Writer spoke with Maralyn Sago, Child psychotherapist at OB/GYN on Friday.  She confirms that patient will be seen in there high risk clinic and can work on the referral to [employer] if necessary.  She is denying any SI or HI and is very happy to be discharging today.    Penelope Coop, MSW  Pager ID [phone]
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
## 2012-06-28 ENCOUNTER — Encounter: Payer: Self-pay | Admitting: Obstetrics & Gynecology

## 2012-06-29 ENCOUNTER — Ambulatory Visit: Payer: Self-pay

## 2012-06-29 ENCOUNTER — Telehealth: Payer: Self-pay

## 2012-06-29 NOTE — Telephone Encounter (Signed)
Attempt made to contact pt as she NOS [facility] appointment.  Female who answers the phone states the number called is a contact number only.  Female verifies address in erecord as pt's address. Message was left asking pt to contact [facility]. Writer will send letter giving pt a NOB for 07/13/12.  This was discussed with Dr.OlsonImogene Burn and approved. Letter sent

## 2012-07-03 ENCOUNTER — Telehealth: Payer: Self-pay
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
## 2012-07-04 ENCOUNTER — Encounter: Payer: Self-pay | Admitting: Obstetrics and Gynecology

## 2012-07-04 ENCOUNTER — Ambulatory Visit: Payer: Self-pay

## 2012-07-04 ENCOUNTER — Other Ambulatory Visit: Payer: Self-pay | Admitting: Gastroenterology

## 2012-07-04 NOTE — Telephone Encounter (Signed)
After discussing with S.Gallivan, SW- will forward this to her.Marland Kitchen

## 2012-07-06 NOTE — Progress Notes (Signed)
Social Work Note -   Pt scheduled with SW after her ultrasound appt at her request to discuss lost medications.  Pt came to ultrasound appt early, requesting to see SW prior to her visit as she had to leave [facility] immediately after her ultrasound.  SW unable to do this, meeting with other pt at the time.  Pt left [facility] without being seen by SW, did not reschedule appt.  Plan -   1.Will ask [facility] staff to schedule pt with SW after her next OB appt    Elissa Hefty, LMSW [phone]

## 2012-07-13 ENCOUNTER — Encounter: Payer: Self-pay | Admitting: Obstetrics and Gynecology

## 2012-07-13 ENCOUNTER — Ambulatory Visit: Payer: Self-pay | Admitting: Obstetrics and Gynecology

## 2012-07-14 ENCOUNTER — Telehealth: Payer: Self-pay

## 2012-07-14 NOTE — Telephone Encounter (Signed)
Female who answers the phone states that pt is not there. Address was verified as pt's aunts address. Will send a letter there giving pt an appointment for 07/25/12 at 8:30am

## 2012-07-24 ENCOUNTER — Encounter: Payer: Self-pay | Admitting: Student in an Organized Health Care Education/Training Program

## 2012-07-25 ENCOUNTER — Encounter: Payer: Self-pay | Admitting: Student in an Organized Health Care Education/Training Program

## 2012-07-25 ENCOUNTER — Ambulatory Visit: Payer: Self-pay | Admitting: Student in an Organized Health Care Education/Training Program

## 2012-07-25 ENCOUNTER — Ambulatory Visit: Payer: Self-pay | Admitting: Nutritionist

## 2012-07-25 VITALS — BP 130/82 | Ht 64.02 in | Wt 288.0 lb

## 2012-07-25 DIAGNOSIS — Z98891 History of uterine scar from previous surgery: Secondary | ICD-10-CM

## 2012-07-25 DIAGNOSIS — Z720 Tobacco use: Secondary | ICD-10-CM

## 2012-07-25 DIAGNOSIS — J309 Allergic rhinitis, unspecified: Secondary | ICD-10-CM

## 2012-07-25 DIAGNOSIS — E669 Obesity, unspecified: Secondary | ICD-10-CM

## 2012-07-25 DIAGNOSIS — IMO0002 Reserved for concepts with insufficient information to code with codable children: Secondary | ICD-10-CM

## 2012-07-25 DIAGNOSIS — L732 Hidradenitis suppurativa: Secondary | ICD-10-CM

## 2012-07-25 DIAGNOSIS — Z3480 Encounter for supervision of other normal pregnancy, unspecified trimester: Secondary | ICD-10-CM

## 2012-07-25 DIAGNOSIS — F32A Depression, unspecified: Secondary | ICD-10-CM

## 2012-07-25 DIAGNOSIS — O0990 Supervision of high risk pregnancy, unspecified, unspecified trimester: Secondary | ICD-10-CM

## 2012-07-25 DIAGNOSIS — O10919 Unspecified pre-existing hypertension complicating pregnancy, unspecified trimester: Secondary | ICD-10-CM

## 2012-07-25 DIAGNOSIS — F1411 Cocaine abuse, in remission: Secondary | ICD-10-CM

## 2012-07-25 DIAGNOSIS — O99320 Drug use complicating pregnancy, unspecified trimester: Secondary | ICD-10-CM

## 2012-07-25 DIAGNOSIS — R058 Other specified cough: Secondary | ICD-10-CM

## 2012-07-25 LAB — POCT URINALYSIS DIPSTICK
Bilirubin,Ur: NEGATIVE
Blood,UA POCT: NEGATIVE
Glucose,UA POCT: NORMAL
Ketones,UA POCT: NEGATIVE
Leuk Esterase,UA POCT: 2 — AB
Lot #: 22453202
Nitrite,UA POCT: NEGATIVE
PH,UA POCT: 6 (ref 5–8)
Protein,UA POCT: NEGATIVE mg/dL
Specific gravity,UA POCT: 1.015 (ref 1.002–1.03)
Urobilinogen,UA: NORMAL

## 2012-07-25 LAB — DRUG SCREEN CHEMICAL DEPENDENCY, URINE
Amphetamine,UR: NEGATIVE
Benzodiazepinen,UR: NEGATIVE
Cocaine/Metab,UR: NEGATIVE
Opiates,UR: NEGATIVE
THC Metabolite,UR: NEGATIVE

## 2012-07-25 MED ORDER — AZITHROMYCIN 250 MG PO TABS *I*
250.0000 mg | ORAL_TABLET | Freq: Every day | ORAL | Status: DC
Start: 2012-07-25 — End: 2012-09-19

## 2012-07-25 MED ORDER — LORATADINE 10 MG PO TABS *I*
10.0000 mg | ORAL_TABLET | Freq: Every day | ORAL | Status: DC | PRN
Start: 2012-07-25 — End: 2012-09-19

## 2012-07-25 MED ORDER — TRAZODONE HCL 50 MG PO TABS *I*
50.0000 mg | ORAL_TABLET | Freq: Every evening | ORAL | Status: AC | PRN
Start: 2012-07-25 — End: 2012-08-24

## 2012-07-25 MED ORDER — CLINDAMYCIN PHOSPHATE 1 % EX SOLN *I*
Freq: Two times a day (BID) | CUTANEOUS | Status: DC
Start: 2012-07-25 — End: 2012-11-07

## 2012-07-25 MED ORDER — PRENATAL PLUS IRON 29-1 MG PO TABS *I*
1.0000 | ORAL_TABLET | Freq: Every day | ORAL | Status: DC
Start: 2012-07-25 — End: 2012-08-11

## 2012-07-25 MED ORDER — NICOTINE 7 MG/24HR TD PT24 *I*
1.0000 | MEDICATED_PATCH | Freq: Every day | TRANSDERMAL | Status: DC
Start: 2012-07-25 — End: 2012-09-26

## 2012-07-25 MED ORDER — SERTRALINE HCL 50 MG PO TABS *I*
50.0000 mg | ORAL_TABLET | Freq: Every evening | ORAL | Status: DC
Start: 2012-07-25 — End: 2012-09-26

## 2012-07-25 MED ORDER — MELATONIN 3 MG PO TABS *I*
6.0000 mg | ORAL_TABLET | Freq: Every evening | ORAL | Status: DC
Start: 2012-07-25 — End: 2012-09-19
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
## 2012-07-26 ENCOUNTER — Telehealth: Payer: Self-pay | Admitting: Student in an Organized Health Care Education/Training Program

## 2012-07-26 LAB — CHLAMYDIA PLASMID DNA AMPLIFICATION: Chlamydia Plasmid DNA Amplification: 0

## 2012-07-26 LAB — VAGINITIS SCREEN: DNA PROBE: Vaginitis Screen:DNA Probe: POSITIVE

## 2012-07-26 LAB — N. GONORRHOEAE DNA AMPLIFICATION: N. gonorrhoeae DNA Amplification: 0

## 2012-07-26 MED ORDER — METRONIDAZOLE 500 MG PO TABS *I*
500.0000 mg | ORAL_TABLET | Freq: Two times a day (BID) | ORAL | Status: AC
Start: 2012-07-26 — End: 2012-08-02

## 2012-07-26 NOTE — Telephone Encounter (Signed)
Pat's vagintis screen positive for BV. Prescription sent to pt's pharmacy. Not able to reach patient. Please contact patient to discuss result with her. Also can we please make her next appointment in the next two weeks instead of in 4 weeks. Thanks!

## 2012-07-27 NOTE — Telephone Encounter (Signed)
Female who answers the phone agrees to give pt a message to contact [facility].

## 2012-07-27 NOTE — Telephone Encounter (Signed)
Attempt made to contact pt.. No answer, no message left at home number. Second number [phone]), not available at this time.

## 2012-07-28 LAB — GYN CYTOLOGY

## 2012-07-28 NOTE — Telephone Encounter (Signed)
Female who answers the phone states that pt does not live there. She did not know where pt was living. Address in [employer] " is wrong" per female.  Female identifies herself as pt's aunt and she has custody of pts kids. Message was left asking pt to contact [facility]. Aunt agrees to give pt the message if pt " comes by."

## 2012-07-31 ENCOUNTER — Telehealth: Payer: Self-pay

## 2012-07-31 NOTE — Telephone Encounter (Signed)
Pt is calling regarding her results and regarding a prescription refill pt would like a nurse to call her back.

## 2012-07-31 NOTE — Telephone Encounter (Signed)
Pt called [facility] and this was documented on separate encounter. Pt informed of the BV diagnosis and that prescription had been sent to her pharmacy.  Pt has appointment in [facility] tomorrow and intends to keep it. Will close encounter.

## 2012-07-31 NOTE — Telephone Encounter (Signed)
Female answers the phone but then telephone is disconnected.. Writer immediately called pt back. Pt was informed that culture revealed BV and prescription had been sent to pharmacy on record.  Pt voiced understanding. Pt reports that she is planning to keep her [facility] appointment for tomorrow. Pt states that she will pick up the prescription for BV and requests that a prescription for Tylenol be sent as well.  Pt informed a request for Tylenol would be sent to [facility] residents

## 2012-08-01 ENCOUNTER — Other Ambulatory Visit: Payer: Self-pay

## 2012-08-08 ENCOUNTER — Other Ambulatory Visit: Payer: Self-pay

## 2012-08-11 ENCOUNTER — Other Ambulatory Visit: Payer: Self-pay

## 2012-08-11 NOTE — Telephone Encounter (Signed)
Prescribed by Angelena Form 07/25/12

## 2012-08-15 MED ORDER — PRENATAL PLUS IRON 29-1 MG PO TABS *I*
1.0000 | ORAL_TABLET | Freq: Every day | ORAL | Status: DC
Start: 2012-08-11 — End: 2012-11-07

## 2012-08-21 ENCOUNTER — Encounter: Payer: Self-pay | Admitting: Obstetrics and Gynecology

## 2012-08-22 ENCOUNTER — Encounter: Payer: Self-pay | Admitting: Psychiatry

## 2012-08-22 ENCOUNTER — Ambulatory Visit: Payer: Self-pay | Admitting: Obstetrics and Gynecology

## 2012-08-22 ENCOUNTER — Ambulatory Visit: Payer: Self-pay | Admitting: Nutritionist

## 2012-08-22 VITALS — BP 134/66 | Ht 64.0 in | Wt 288.0 lb

## 2012-08-22 DIAGNOSIS — F39 Unspecified mood [affective] disorder: Secondary | ICD-10-CM

## 2012-08-22 DIAGNOSIS — IMO0002 Reserved for concepts with insufficient information to code with codable children: Secondary | ICD-10-CM

## 2012-08-22 DIAGNOSIS — R058 Other specified cough: Secondary | ICD-10-CM

## 2012-08-22 DIAGNOSIS — Z720 Tobacco use: Secondary | ICD-10-CM

## 2012-08-22 DIAGNOSIS — F1411 Cocaine abuse, in remission: Secondary | ICD-10-CM

## 2012-08-22 DIAGNOSIS — E669 Obesity, unspecified: Secondary | ICD-10-CM

## 2012-08-22 DIAGNOSIS — O0990 Supervision of high risk pregnancy, unspecified, unspecified trimester: Secondary | ICD-10-CM

## 2012-08-22 DIAGNOSIS — F32A Depression, unspecified: Secondary | ICD-10-CM

## 2012-08-22 LAB — POCT URINALYSIS DIPSTICK
Blood,UA POCT: NEGATIVE
Glucose,UA POCT: NORMAL
Ketones,UA POCT: NEGATIVE
Lot #: 22453202
Nitrite,UA POCT: NEGATIVE
PH,UA POCT: 7 (ref 5–8)
Specific gravity,UA POCT: 1.015 (ref 1.002–1.03)
Urobilinogen,UA: NORMAL

## 2012-08-22 LAB — DRUG SCREEN CHEMICAL DEPENDENCY, URINE
Amphetamine,UR: NEGATIVE
Benzodiazepinen,UR: NEGATIVE
Cocaine/Metab,UR: NEGATIVE
Opiates,UR: NEGATIVE
THC Metabolite,UR: NEGATIVE

## 2012-08-22 LAB — PROTEIN, URINE: Protein,UR: 7 mg/dL (ref 0–11)
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
## 2012-08-22 NOTE — Progress Notes (Signed)
I met today with Jenna [name] who was accompanied to her appt with boyfriend Jenna [name].  Jenna [name], SW was also present.  Jenna [name] appeared comfortable, was very engageable and interactive today but somewhat irritable.  She has not followed through with [facility] (appt set-up in April as part of the discharge plan from her inpatient psychiatric admission), admitting she missed her intake appt. She did not call to reschedule.  Additionally, she was discharged on sertraline 50 mg daily however she only took it for 2 days since discharge.  She also had been prescribed Trazodone and took that with the sertraline but felt very sleepy and "weird".  Jenna [name] thought that the medications she had been prescribed were narcotics and after seeing her reaction to the meds, flushed them down the toilet.  Jenna [name] had a multitude of excuses for her not following through on both [facility] and [employer] [facility] (apparently showed up 15 mins for her appt and they would not reschedule her. She has to follow up with CD treatment in order to continue with her Public Assistance).  I was gently confrontive with her about her role in taking responsibility for the consequences of her behavior and she actually was able to listen patiently to me.  She readily admits that she tends to "be mouthy" and argues with others and also recognizes how this may prevent others desire to help her out.  Jenna [name] denies any current symptoms of depression, no suicidal ideation.  We spoke openly about her typical way of dealing with frustration and not having things go the way she believes is best. She expressed much anger about how those people "who I love don't give me the support I need."  We discussed the role her past history may be playing in other's skepticism in believing that she is truly changing, specifically no longer using drugs and she was able to show a element of understanding.  I provided her with the phone number to [facility] so  she could call to reschedule her intake appt and I also suggested she refill her sertraline prescription (50 mg daily) as this could be helpful in managing her irritability. She agreed to this plan.      I will continue to follow.

## 2012-08-22 NOTE — Patient Instructions (Signed)
Common Discomforts: THIRD TRIMESTER    The last few weeks of your pregnancy are often the most uncomfortable.  Your growing baby is taking up more and more space.   Carrying around all that extra weight can make you more tired than usual.  As the baby and your uterus press up, down and out, other discomforts arise.     What are the most common discomforts during this last part of my pregnancy?  Most women have some or all of the following discomforts: edema (swelling), insomnia (unable to get to sleep or stay asleep), uncomfortable intercourse (sex), going to the bathroom a lot, shortness of breath and numbness or tingling in the fingers.   Discomforts you developed in your second trimester, such as leg cramps, constipation or hemorrhoids, may continue, get better or get worse.  Every woman is different and every pregnancy is different.     What can I do to prevent or relieve any discomforts?  No matter how careful you are, it is almost impossible to avoid all discomforts during your pregnancy.  Face it--carrying around a 6-10 pound baby and all that goes with it (placenta, uterus, bag of waters) in the tight space between your rib cage and your hips is bound to be a bit of a hassle!  There are many things you can do to make these last weeks easier on yourself.  This guide looks at each of most common discomforts.     Edema  Edema is swelling of any part of your body.  Your feet, ankles and hands most commonly swell during late pregnancy.  Sometimes swelling can be a sign of a problem.  (See the guide on Warning Signs-Second Trimester).  Sudden swelling, especially in your face or upper body, can be a warning sign.  Call your health care provider (HCP) right away if you have swelling in your face or above your waist.     Swelling also happens because your hormones make your blood vessels leak.  Also, the pressure of your baby on your hips sometimes keeps your blood from flowing well in your legs.  That is why your  feet and ankles may swell.  Eating normal amounts of salt on your food will not cause this, but taking salt pills or eating a lot of salt might make edema worse.    Here are some things to do that can help:   • Try not to wear tight clothing.  Tight waistbands or knee-high and thigh-high socks are especially a problem.  • Take rest periods often. Raise your legs higher than your heart if possible.  At the very least, put your feet up so they are level with your hips.  • Use support panty hose to help the blood flow in your legs  • Watch the amount of salt and salty foods in your diet.   • Call your HCP if the swelling keeps getting worse or happens very quickly.     Insomnia    Insomnia is not being able to sleep.  Sometimes people have trouble falling asleep.  Other people fall asleep, but wake up in the middle of the night and cannot get back to sleep.  Most women have changes in their sleep patterns during pregnancy.  Sometime the size of the baby makes it hard to find a comfortable position.   Pressure on your bladder may make you have to get up to go the bathroom at night.  Stress worrying about things   and being anxious can also keep you from sleeping well.    Here are some things that might help you sleep better:  • Avoid exciting activities before bedtime.   No relay races or scary movies!  • Take a warm bath.  • Try the side-lying position for rest and relaxation.  Some women prefer to support the upper leg with pillows.  • Read a dull book.  • Have someone give you a back rub or massage!  • Exercise before dinner or at least 3 hours before bedtime.   • Take a short nap during the day so you are not overly tired.  • Try not to drink things with caffeine, like coffee, tea, cola or other soda (read the labels).  • Sip on a glass of milk before bed or have a small bowl of cereal with milk.   • Keep a pad by your bed and write down things that are bothering you.  Some women cannot get to sleep for fear they will  forget what they are thinking about.     If there are things that are worrying you, talk to someone about your worries.  Your HCP might be able to give you some other ideas too!    Discomforts during sex    During this last trimester, it can be more challenging to enjoy “being with” your special someone.  Lots of things make a difference in how much you enjoy sex.   Pressure from the growing baby and changes in your vagina make sex feel different.  Your larger belly can get in the way! How you feel and think can change your feelings about sex too.  It is okay to have sex, usually all the way up until your baby’s birth.   You will not hurt the baby!    In some special cases, your HCP may tell you not to have sex or even sex play especially playing with nipples (this can lead to preterm labor).  Except in these special cases, though, you can enjoy sex as much as you and your partner like.        Here are some things that can help make you more comfortable:  • Try different positions.  Lying on your side of often helps.  • Use a water soluble vagina gel, like K-Y Jelly, to keep things slippery.   Do not use petroleum jelly or mineral oil.  Look for water soluble on the package.  • Talk about any fears or concerns you have with your partner or your HCP.   • Tell your HCP if you have any symptoms of a vaginal infection (itching, burning, pain with sex, strange discharge).  Be sure to follow all instructions for treatment.   • Find other ways to express your feelings.  Try new ways to give your partner pleasure if the sex act itself gets too uncomfortable.  See if there are ways he can make you feel better without sex too.    Frequent urination    You may find yourself having to go to the bathroom a lot more often now.  The baby’s head is pushing your bladder (urine storage area).  Your kidneys are also making more urine these days, because you have more blood flowing through them.    Sometimes you develop a urinary  infection.   If it happens, you will also find yourself    urinating more often.  But you may also notice burning   when you go and your urine may be cloudy or smell different than usual.  If any of these things happen, tell your HCP.  There are medicines that can help get rid of the infection and make you feel better.     Things you can do to be more comfortable are:  • Drink fluids often.  Do not cut back on fluids.  Do not drink all your liquids for 1 day at the same time.  Try carrying a large cup of water with you and sip on it often.   • Do not drink liquids with caffeine in them.  Too much caffeine may not be good for the baby.  Caffeine also makes you have to urinate more often.  Coffee and tea have caffeine--even the decaffeinated kind has a little.   Colas and other dark sodas often have caffeine.  Even some “clear” sodas have caffeine, so read the labels to see if caffeine is listed in the ingredients.  • Do Kegel exercises.   Squeeze the muscles in your bottom, then release.   Try to do 10 squeezes every time you think of it.   This will make it easier for you to hold your urine until you can get to a bathroom.    Shortness of breath    A feeling of having a hard time catching your breath occurs in this last trimester.  This is caused by the baby getting bigger and pushing up on your ribs and lungs.  This makes it hard for your lungs to stretch and let you take a deep breath.  Sometimes other problems can make this feeling worse.   If you have a cold with a fever, or if you have had other problems with your heart and lungs, tell your HCP if you get short of breath.  Otherwise here are some ways to breath easier:  • Do not do anything that you find makes you short of breath!  Try not to bend over for long periods.  Pace your exercise and walking so you do not have trouble catching your breath.  Take stairs more slowly.  • Do not wear clothing that is too tight.  Make sure you get larger bras and blouses as  you chest gets larger.   • If lying down makes it harder to breathe, add extra pillows under your head and back at night.   • Split up your activities to include more rest breaks.     Numbness or tingling in the fingers    Some women have strange feelings of numbness or tingling in their fingers toward the end of their pregnancy.  This can come from a change in your posture that puts pressure on the nerves to your hand and arms.  It is more common at night and in the early morning. Moving around or stretching often makes it go away.      It is not normal to have pain, loss of sensation where you cannot feel anything, or numbness that affects your whole hand or wrist.  Let your HCP know if this happens.   If the numbness happens along with swelling, especially first thing in the morning, call your HCP right away.     For normal numbness or tingling, some things that might help include the following:    • Try to keep your back straight.  Do not slouch.   • Raise your hand or hands over your head for a few   minutes when numbness or tingling occurs.   • Do stretching and relaxation exercises for your shoulders, arms and hands.  • Sleep with your hands and forearms up on a pillow.    • If you use a computer a lot, use a wrist pad when typing.   • Ask your HCP about using a wrist splint if the problem does not get better.     Most women have some amount of discomfort as their pregnancy reaches the final weeks.   Talk to your HCP about any discomforts or concerns you have.  Remember--you are now only a few short weeks away from having that baby in your arms.     Reviewed 12/2009
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
## 2012-08-24 ENCOUNTER — Telehealth: Payer: Self-pay

## 2012-08-24 NOTE — Telephone Encounter (Signed)
Spoke to patient briefly - she came to the conclusion she would rather be seen in [place]. Jenna [name]'s  "Walk in Care" today when it is convenient for her. States she does not have a PCP.    Described pain in her right ear and neck, along with swelling and  pain in her ear when she bites down. No tooth pain. No OBGYN complaints or concerns. Confirmed that Urgent care is appropriate for this acute issue.

## 2012-08-24 NOTE — Telephone Encounter (Signed)
obc 29wks  edc 11/03/12    Patient is having severe right ear drum pain=8    Patient states that none of her concerns that she told doctor on 08/22/12 was address    Please call [phone]

## 2012-08-25 ENCOUNTER — Telehealth: Payer: Self-pay

## 2012-08-25 NOTE — Telephone Encounter (Signed)
Attempt was made to contact pt.. Message left informing pt that message about the referral had been received and forwarded on to [facility].  Writer sent email to [facility] as well.

## 2012-08-25 NOTE — Telephone Encounter (Signed)
[facility] at 30+0 weeks is requesting [name] to make a referral to the [facility] at [facility] for mental health and chemical dependency all inclusive program.

## 2012-08-29 ENCOUNTER — Telehealth: Payer: Self-pay

## 2012-08-29 ENCOUNTER — Encounter: Payer: Self-pay | Admitting: Psychiatry

## 2012-08-29 NOTE — Telephone Encounter (Signed)
Patient returned call saying she needs a referral faxed.

## 2012-08-29 NOTE — Telephone Encounter (Signed)
[facility] patient requesting to be called by Jenna [name].

## 2012-08-29 NOTE — Progress Notes (Signed)
I received a phone call from Jenna [name] last Thurs requesting from her that I make a formal referral to [facility] as she has missed her previously scheduled appointment in April.  This appointment was made at her discharge from inpatient psych for follow-up mental health care.  She was also scheduled for an intake at [facility] but she missed that appt. as well, saying her "cab was late" in getting her there so she was not seen.  She has subsequently been sanctioned by DSS since she did not follow through on the appointments.  She has requested a fair hearing.  I called [facility] and was able to have [facility] agree to see Jenna [name] for an intake appt despite her previous no show.  The intake coordinator requested her discharge summary from her [facility] admission which I subsequently faxed over to them.  The intake coordinator was going to call Jenna [name] directly to set-up the appt. Jenna [name] then called [facility] on Friday requesting a different referral for programs at [facility] and asked that I complete that.  I was not at [facility] on Friday and upon my return today, I called Jenna [name] and left her a message that she should keep the intake appt already scheduled and in the context of that appt., she could discuss with the therapist what programs she needs.  Included in my message to her was that it was her responsibility to follow-through on this and that I would see her at her next OB appointment.

## 2012-08-29 NOTE — Telephone Encounter (Signed)
Patient says she doesn't have an appointment at [facility] anymore because it was the wrong appointment. Pt can be reached at [phone].

## 2012-08-29 NOTE — Telephone Encounter (Signed)
Pt states that [facility] sent a referral to a mental health program.. Pt was talking with provider from this program and told this provider that she wanted an appointment in the [facility] program .. This is a Mental Health and Chemical dependency program.  Per pt, the provider with the mental  Health program instructed pt that a new referral would need to sent to [phone] which is for the [facility] program.  Pt was informed that writer would send a message to [employer].

## 2012-08-31 ENCOUNTER — Encounter: Payer: Self-pay | Admitting: Psychiatry

## 2012-08-31 NOTE — Progress Notes (Signed)
I received notification that Jenna [name] had called again requesting a different referral for her outpt mental health treatment, specifically asking for a dual-diagnosis program.  I called her again and she said that she was told that [facility] has a dual-diagnosis program.  I explained to Jenna [name] that I would call to find out but this would be the last time I would be able to do this since she has not followed through on any of the other referrals that were made in April.  I spoke with the intake coordinator at [facility] and they do NOT have a dual-diagnosis program and they are not seeing any new patients until late August.  I called Jenna [name] back and gave her the information and repeated my previous suggestion to her that she keep the intake appt with [facility] since this was a crisis appt - otherwise she would be waiting a minimum of 4 weeks to be seen - and she could discuss her CD issues at that appt.  Jenna [name] then told me she never made the appt with [facility] (this was confirmed by the intake coordinator at [facility]) so I told Jenna [name] to call them back and set-up an appt.  She stated she would call them to set-up the appt.

## 2012-09-01 ENCOUNTER — Ambulatory Visit
Admit: 2012-09-01 | Discharge: 2012-09-01 | Disposition: A | Discharge status: Home / Self Care | Payer: Self-pay | Source: Ambulatory Visit | Attending: Obstetrics and Gynecology | Admitting: Obstetrics and Gynecology

## 2012-09-01 DIAGNOSIS — O0990 Supervision of high risk pregnancy, unspecified, unspecified trimester: Secondary | ICD-10-CM

## 2012-09-01 DIAGNOSIS — E669 Obesity, unspecified: Secondary | ICD-10-CM

## 2012-09-01 DIAGNOSIS — Z3480 Encounter for supervision of other normal pregnancy, unspecified trimester: Secondary | ICD-10-CM

## 2012-09-01 LAB — COMPREHENSIVE METABOLIC PANEL
ALT: 12 U/L (ref 0–35)
AST: 14 U/L (ref 0–35)
Albumin: 3.6 g/dL (ref 3.5–5.2)
Alk Phos: 72 U/L (ref 35–105)
Anion Gap: 11 (ref 7–16)
Bilirubin,Total: 0.2 mg/dL (ref 0.0–1.2)
CO2: 24 mmol/L (ref 20–28)
Calcium: 9.1 mg/dL (ref 8.8–10.2)
Chloride: 105 mmol/L (ref 96–108)
Creatinine: 0.6 mg/dL (ref 0.51–0.95)
GFR,Black: 145 *
GFR,Caucasian: 125 *
Glucose: 104 mg/dL — ABNORMAL HIGH (ref 60–99)
Lab: 8 mg/dL (ref 6–20)
Potassium: 3.5 mmol/L (ref 3.3–5.1)
Sodium: 140 mmol/L (ref 133–145)
Total Protein: 6.2 g/dL — ABNORMAL LOW (ref 6.3–7.7)

## 2012-09-01 LAB — GLUCOSE TOLERANCE, 1 HOUR: Glucose,50gm 1HR: 103 mg/dL (ref 63–135)

## 2012-09-01 LAB — HEMATOCRIT: Hematocrit: 32 % — ABNORMAL LOW (ref 34–45)

## 2012-09-04 ENCOUNTER — Encounter: Payer: Self-pay | Admitting: Obstetrics and Gynecology

## 2012-09-05 ENCOUNTER — Encounter: Payer: Self-pay | Admitting: Nutritionist

## 2012-09-05 ENCOUNTER — Telehealth: Payer: Self-pay

## 2012-09-05 NOTE — Telephone Encounter (Signed)
Please see SW note in chart.  Denny Peon. Tarissa Kerin, LMSW

## 2012-09-05 NOTE — Telephone Encounter (Signed)
Please see separate encounter for documentation regarding SW input regarding transportation. Will close encounter.

## 2012-09-05 NOTE — Telephone Encounter (Signed)
Per Cal, pt left message on her voice mail stating that any information regarding transportation should be sent to  Her at her address.  Per pt, she has no telephone. Will speak with K, Hober, SW.

## 2012-09-05 NOTE — Progress Notes (Signed)
SW Note:  Patient is known to SW. Patient is aware that she is not approved for medical cab. She is aware that she can call Medicaid transportation and access bus passes. SW cannot contact her at this time because she does not have a phone. Would recommend that if patient calls again to request assistance with transportation, she contact Medicaid. She is welcome to further discuss this with SW at next prenatal visit.Denny Peon. [place], LMSW, [phone], beeper [phone]

## 2012-09-05 NOTE — Telephone Encounter (Signed)
Patient called to reschedule appt's has no phone, needs letter to be mail for new appt and transportation.[facility] made appt mailed letter and still needs transportation set up.

## 2012-09-09 ENCOUNTER — Encounter: Payer: Self-pay | Admitting: Obstetrics & Gynecology

## 2012-09-09 ENCOUNTER — Telehealth: Payer: Self-pay | Admitting: Obstetrics & Gynecology

## 2012-09-09 MED ORDER — ACETAMINOPHEN 325 MG PO TABS *I*
650.0000 mg | ORAL_TABLET | Freq: Four times a day (QID) | ORAL | Status: DC | PRN
Start: 2012-09-09 — End: 2013-02-16

## 2012-09-09 MED ORDER — GUAIFENESIN 600 MG PO TB12 *I*
1200.0000 mg | ORAL_TABLET | Freq: Two times a day (BID) | ORAL | Status: DC
Start: 2012-09-09 — End: 2012-09-19

## 2012-09-09 NOTE — Telephone Encounter (Addendum)
26yo [id number] at [redacted]w[redacted]d [place] call c/o nasal congestion, sinus pain, sore throat and nonproductive cough.  She denies any HA, visual changes, dyspnea, palpitations, chest pain, fevers, chills, urinary symptoms, contractions, vaginal bleeding, vaginal discharge, leakage of fluid or decreased fetal movement.    Recommended rest, hydration, tylenol prn and mucinex prn (and prescriptions sent to patient's pharmacy).    Reviewed need to call for fever, dyspnea, chest pain, or signs/sx of labor.  Otherwise patient will f/u as previously scheduled 09/19/12.    Jeanette Caprice, MD

## 2012-09-09 NOTE — Telephone Encounter (Signed)
Patient called back, asking whether an antibiotic could be prescribed over the phone.  Discussed that her history is consistent with viral URI, and in the absence of fevers she should try the above symptomatic relief measures, and schedule appointment if not improved.    Patient then called back a third time, stating that her Medicaid does not cover tylenol or mucinex because these can be purchased over the counter, which she cannot afford to do, again asking for antibiotics.  Repeated instructions above, encouraging rest and hydration, callback if febrile and follow-up in office if not improved.    Patient then called back a fourth time, asking the same question.  With each phone call, I had reiterated to patient that the answering service is intended for emergency calls and that symptoms and requests that can wait until the office opens should do so.  When I reiterated this with her fourth page through the answering service in 1 hour, she became angry, cursed over the phone and hung up.    Jeanette Caprice, MD

## 2012-09-11 NOTE — Telephone Encounter (Signed)
Pt calling to speak with someone regarding medication she can take for URI symptoms- requesting [email or web address]

## 2012-09-17 ENCOUNTER — Observation Stay
Admit: 2012-09-17 | Disposition: A | Discharge status: Home / Self Care | Payer: Self-pay | Source: Ambulatory Visit | Attending: Obstetrics and Gynecology | Admitting: Obstetrics and Gynecology

## 2012-09-17 NOTE — OB Triage Note (Signed)
Pt arrived to triage via EMS with several vague complaints of persistent dry cough, swollen feet and a single episode of "2 drops of blood in the toilet when goin pee" earlier tonight. Patient denies contractions, cramping and bleeding at this time. States last IC was yesterday.  Denies h/a, RUQ pain and visuals changes.     Trace swelling of the feet bilaterally observed. EFM applied FHR 125-135 bpm. Patient then states that she no longer desires to be seen in triage and requested a medicaid cab be called for her so she can go home. Stating her kids are in the waiting room and they are tired. Pt states she has appt Tuesday and she feels there is nothing wrong at this time and wants to go home. Discussed with patient that we are unable to tell if if things are normal or concerning unless we do an exam and workup to make sure she is safe. Patient still requesting to leave at this time, stating that she no longer wants to be seen.     Patient scenario reviewed with Dr. Sharion Balloon who is agreeable to the patients request to go home. Warning signs of labor and pre-eclampsia reviewed with patient prior to her leaving.   Marta Antu, RN

## 2012-09-19 ENCOUNTER — Encounter: Payer: Self-pay | Admitting: Obstetrics and Gynecology

## 2012-09-19 ENCOUNTER — Ambulatory Visit: Payer: Self-pay | Admitting: Nutritionist

## 2012-09-19 ENCOUNTER — Ambulatory Visit: Payer: Self-pay | Admitting: Obstetrics and Gynecology

## 2012-09-19 VITALS — BP 136/80 | Ht 64.02 in | Wt 296.0 lb

## 2012-09-19 DIAGNOSIS — O0990 Supervision of high risk pregnancy, unspecified, unspecified trimester: Secondary | ICD-10-CM

## 2012-09-19 DIAGNOSIS — IMO0002 Reserved for concepts with insufficient information to code with codable children: Secondary | ICD-10-CM

## 2012-09-19 DIAGNOSIS — Z3480 Encounter for supervision of other normal pregnancy, unspecified trimester: Secondary | ICD-10-CM

## 2012-09-19 DIAGNOSIS — E669 Obesity, unspecified: Secondary | ICD-10-CM

## 2012-09-19 DIAGNOSIS — O10919 Unspecified pre-existing hypertension complicating pregnancy, unspecified trimester: Secondary | ICD-10-CM

## 2012-09-19 DIAGNOSIS — F1411 Cocaine abuse, in remission: Secondary | ICD-10-CM

## 2012-09-19 DIAGNOSIS — R059 Cough, unspecified: Secondary | ICD-10-CM

## 2012-09-19 DIAGNOSIS — F32A Depression, unspecified: Secondary | ICD-10-CM

## 2012-09-19 DIAGNOSIS — Z720 Tobacco use: Secondary | ICD-10-CM

## 2012-09-19 DIAGNOSIS — Z98891 History of uterine scar from previous surgery: Secondary | ICD-10-CM

## 2012-09-19 LAB — POCT URINALYSIS DIPSTICK
Bilirubin,Ur: NEGATIVE
Blood,UA POCT: NEGATIVE
Exp date: 62015
Glucose,UA POCT: NORMAL
Ketones,UA POCT: NEGATIVE
Leuk Esterase,UA POCT: NEGATIVE
Lot #: 22453202
Nitrite,UA POCT: NEGATIVE
PH,UA POCT: 7 (ref 5–8)
Protein,UA POCT: NEGATIVE mg/dL
Specific gravity,UA POCT: 1.01 (ref 1.002–1.03)
Urobilinogen,UA: NORMAL

## 2012-09-19 LAB — VAGINITIS SCREEN: DNA PROBE: Vaginitis Screen:DNA Probe: POSITIVE

## 2012-09-19 MED ORDER — ALBUTEROL SULFATE HFA 108 (90 BASE) MCG/ACT IN AERS *I*
1.0000 | INHALATION_SPRAY | Freq: Four times a day (QID) | RESPIRATORY_TRACT | Status: AC | PRN
Start: 2012-09-19 — End: 2012-10-19

## 2012-09-19 MED ORDER — LORATADINE 10 MG PO TABS *I*
10.0000 mg | ORAL_TABLET | Freq: Every day | ORAL | Status: AC | PRN
Start: 2012-09-19 — End: 2012-10-19

## 2012-09-19 NOTE — Progress Notes (Signed)
Pt seen today for a nutrition consultation for increased wt gain this pregnancy.  Pts wt today was 296 lbs, which represents a 28 lb gain to date this pregnancy.  Pt states no known food allergies, takes a prenatal every other day - states causes constipation.  Pt states no nausea or vomiting.  Pt states craves cornstarch daily - initially denies use, however later states does ingest - Rx for supplemental iron written on 6/3 - pt has not picked up.  Pt encouraged to begin ASAP.  Pt states appetite is good - 3 meals + snacks, craves fried foods.    RECALL:    11:00: wake  11:30:  2 pcs fried fish, egg, bacon and cheese sandwich, fruit punch soda  2:00:  Corn on the cob popcorn, toaster strudel, pineapple soda, cheeries, fried chicken, 1/2 fried pork chop, juice  9:00:  Fried chicken, green beans with cheese, white rice, fruit punch soda  11:00: Fried chicken on white bread, fruit punch soda    Pt states 2 cups low fat milk daily    PLAN:    Pt to aim for no further wt gain this pregnancy.    Pt to aim for balanced and consistent meals, eating within 1 hour of waking and going no longer than 3 - 4 hours between meal/snack    Pt to aim for increased fiber from whole grains, fruits and vegetables    Pt to aim for increased calcium rich dairy, 3 - 4 servings daily    Pt to aim for proper portioned plate:  1/2 non starchy vegetables, 1/4 lean protein, 1/4 starch or starchy vegetable    Pt to aim for daily physical activity of a minimum of 30 minutes accumulated daily - goal of 60 minutes - can be broken down into small increments daiily    Pt given written material:   Food Planning for Everyday   What to Eat While Pregnant       Pt to schedule a f/u at time of next prenatal provider visit
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
## 2012-09-19 NOTE — Progress Notes (Signed)
Social Work Note -  Pt requesting SW after today's visit, specifically wants to borrow office phone so that she can make Medicaid cab arrangements for her next [facility] appt - states that her phone is not currently working.  Pt came to appt today by Medicaid cab - made arrangements from aunt's home.  I brought pt back to SW office, gave her Medicaid Transportation # to call.  As she began to dial number - Jeannine Boga came to SW office and told pt that her ride was here.  Pt left office with Medicaid Transportation #, stated that she would be able to access a telephone at some point this week to make transportation arrangements.    Elissa Hefty, LMSW [phone]

## 2012-09-19 NOTE — Progress Notes (Signed)
SW Note:  Patient requested to see SW again today. Saw her with Roselie Awkward, NP, who had begun interview when SW arrived. Patient was again requesting cab service. Explained to Ms. Gellner that because cabs had been arranged for her that she then did not use, at this point she is not approved for the service. In addition, she is medically appropriate to use public transportation. Patient then requested that I call [employer] and reinitiate referral. Told her I will call but cannot promise that they will  Be able to accommodate her due to late gestatonal age.  Plan: as above. Will contact [employer]. SW is available for duration of the pregnancy.  Denny Peon. Parnell, LMSW, [phone], beeper [phone]

## 2012-09-20 ENCOUNTER — Encounter: Payer: Self-pay | Admitting: Obstetrics and Gynecology

## 2012-09-20 ENCOUNTER — Other Ambulatory Visit: Payer: Self-pay | Admitting: Obstetrics and Gynecology

## 2012-09-20 DIAGNOSIS — B9689 Other specified bacterial agents as the cause of diseases classified elsewhere: Secondary | ICD-10-CM

## 2012-09-20 DIAGNOSIS — N760 Acute vaginitis: Secondary | ICD-10-CM

## 2012-09-20 LAB — N. GONORRHOEAE DNA AMPLIFICATION: N. gonorrhoeae DNA Amplification: 0

## 2012-09-20 LAB — CHLAMYDIA PLASMID DNA AMPLIFICATION: Chlamydia Plasmid DNA Amplification: 0

## 2012-09-20 MED ORDER — METRONIDAZOLE 500 MG PO TABS *I*
500.0000 mg | ORAL_TABLET | Freq: Two times a day (BID) | ORAL | Status: DC
Start: 2012-09-20 — End: 2012-09-26

## 2012-09-21 NOTE — Progress Notes (Signed)
SW Note:  Patient did not want to meet with SW today in [facility]. SW did contact [employer] to discuss patient's referral. [employer] coordinator Lowella Bandy Spring reports that several attempts were made to contact patient. Finally, patient was sent a letter, but did not respond. Ms. Federico is now enrolled in [employer] and would be appropriate for referral to [facility]. She is unfortunately too late in pregnancy to be eligible for program.   Plan:   as above. Will call Silvio Pate at [facility] and request that patient be referred for outreach.  SW is available for duration of the pregnancy.  Denny Peon. Kirby, LMSW, [phone], beeper [phone]

## 2012-09-22 ENCOUNTER — Other Ambulatory Visit: Payer: Self-pay

## 2012-09-25 ENCOUNTER — Encounter: Payer: Self-pay | Admitting: Obstetrics & Gynecology

## 2012-09-26 ENCOUNTER — Ambulatory Visit: Payer: Self-pay

## 2012-09-26 ENCOUNTER — Other Ambulatory Visit: Payer: Self-pay | Admitting: Gastroenterology

## 2012-09-26 ENCOUNTER — Other Ambulatory Visit: Payer: Self-pay

## 2012-09-26 ENCOUNTER — Ambulatory Visit: Payer: Self-pay | Admitting: Obstetrics & Gynecology

## 2012-09-26 ENCOUNTER — Encounter: Payer: Self-pay | Admitting: Psychiatry

## 2012-09-26 VITALS — BP 130/82

## 2012-09-26 VITALS — BP 130/82 | Ht 64.02 in | Wt 286.0 lb

## 2012-09-26 DIAGNOSIS — F32A Depression, unspecified: Secondary | ICD-10-CM

## 2012-09-26 DIAGNOSIS — F1411 Cocaine abuse, in remission: Secondary | ICD-10-CM

## 2012-09-26 DIAGNOSIS — IMO0002 Reserved for concepts with insufficient information to code with codable children: Secondary | ICD-10-CM

## 2012-09-26 DIAGNOSIS — O0940 Supervision of pregnancy with grand multiparity, unspecified trimester: Secondary | ICD-10-CM

## 2012-09-26 DIAGNOSIS — Z720 Tobacco use: Secondary | ICD-10-CM

## 2012-09-26 DIAGNOSIS — N760 Acute vaginitis: Secondary | ICD-10-CM

## 2012-09-26 LAB — POCT URINALYSIS DIPSTICK
Bilirubin,Ur: NEGATIVE
Blood,UA POCT: NEGATIVE
Glucose,UA POCT: NORMAL
Ketones,UA POCT: NEGATIVE
Lot #: 22453202
Nitrite,UA POCT: NEGATIVE
PH,UA POCT: 7 (ref 5–8)
Specific gravity,UA POCT: 1.015 (ref 1.002–1.03)
Urobilinogen,UA: NORMAL

## 2012-09-26 MED ORDER — DOCUSATE SODIUM 100 MG PO CAPS *I*
100.0000 mg | ORAL_CAPSULE | Freq: Two times a day (BID) | ORAL | Status: DC
Start: 2012-09-26 — End: 2012-11-07

## 2012-09-26 MED ORDER — METRONIDAZOLE 500 MG PO TABS *I*
500.0000 mg | ORAL_TABLET | Freq: Two times a day (BID) | ORAL | Status: AC
Start: 2012-09-26 — End: 2012-10-03

## 2012-09-26 NOTE — Progress Notes (Signed)
Social Work Note -   Pt requesting SW for assistance in gathering baby items.  Pt given Administrator, Civil Service - encouraged to review, make phone calls to desired agencies and call or request SW at next visit for referrals if needed.    Elissa Hefty, LMSW [phone]

## 2012-09-26 NOTE — Progress Notes (Signed)
Thi is looking well today. Affect is bright, smiling frequently throughout the interview.  She reports that she did her intake at [facility] last week and is to start her groups next week.  She states that she is having a baby shower on Aug. 1st that she has arranged for herself since no one else stepped forward to host it.  Her aunt is going to help her out.  She and Nonnie Done are doing well per Lota's report.  I will continue to be available as needed.

## 2012-09-26 NOTE — Procedures (Signed)
Fetal Non-Stress Test  Patient: Jenna [name]  Age: 27 y.o.  Date of Birth: April 17, 1985  [id number]  MRN: [id number]  GA: [redacted]w[redacted]d    Reason for exam: CHTN in pregnancy     Maternal pulse 84      BP: 130/82 mmHg (09/26/12 1057)    Education Done:   External fetal monitoring: yes   Fetal kick counts: yes   NST: yes   VAS: no   Manual stimulation:no    Other nst    Non-Stress test: Fetus A  NST Start Time: 1056 NST End Time:  1123   Variability: 6-25 bpm- Moderate Baseline:  135   Decelerations: no Uterine Irritability:  no   Accelerations: Yes 15X15 Contractions: Not Present   Acoustic Stimulator: no Minutes Between Contractions:  none     Interpretation:  Nonstress Test Interpretation:  Reactive   Provider Reccomendations: Suggest repeat NST in 5-7 days (weekly) or as indicated by clinical condition.   Interpreting Provider: Lyndal Rainbow   Comments: CHTN in pregnancy. fhr traced in 4 locations during nst d/t fetal activity   Next Test Date:  weekly nsts     Additional Fetus:No    Test performed By: Elmyra Ricks, RN  09/26/2012 10:57 AM

## 2012-09-26 NOTE — Patient Instructions (Signed)
Go to your NST after this visit.  Then please get you labs drawn.    You should have an NST next week.  You need to be seen in the clinic and have another ultrasound in two weeks.
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
## 2012-09-27 ENCOUNTER — Encounter: Payer: Self-pay | Admitting: Obstetrics & Gynecology

## 2012-09-27 LAB — HIV 1&2 ANTIGEN/ANTIBODY: HIV 1&2 ANTIGEN/ANTIBODY: NONREACTIVE

## 2012-09-28 ENCOUNTER — Observation Stay
Admit: 2012-09-28 | Disposition: A | Discharge status: Home / Self Care | Payer: Self-pay | Source: Ambulatory Visit | Attending: Obstetrics and Gynecology | Admitting: Obstetrics and Gynecology

## 2012-09-28 ENCOUNTER — Telehealth: Payer: Self-pay

## 2012-09-28 LAB — URINALYSIS REFLEX TO CULTURE
Blood,UA: NEGATIVE
Ketones, UA: NEGATIVE
Leuk Esterase,UA: NEGATIVE
Nitrite,UA: NEGATIVE
Protein,UA: NEGATIVE mg/dL
Specific Gravity,UA: 1.017 (ref 1.002–1.030)
pH,UA: 7 (ref 5.0–8.0)

## 2012-09-28 LAB — LEAD VENOUS: Lead,Venous: <1 ug/dl (ref 0–25)

## 2012-09-28 LAB — LEAD, BLOOD

## 2012-09-28 NOTE — OB Triage Note (Addendum)
OB TRIAGE NOTE    Chief Complaint: blood in urine      HPI   27 y.o. [id number] at [redacted]w[redacted]d presents to triage with complaints of blood in her urine. This has happened about once per week. This week she noticed pink-tinge to her urine and another time noticed a few drops of blood swirling in her urine. She has noticed increased vaginal discharge and is unsure if she is leaking fluid. No pain. No VB, contractions. +FM.    PREGNANCY RISKS:  Depression, h/o SI   H/o death of infant at 62mo from SIDS   H/o cocaine abuse   - Positive 4/2 --> negative since  - SW following patient since release from jail 04/11/12   H/o LTCS x2   - First in 2009 for FTP   - Second rLTCS   cHTN   - No medications   - BPs mild range   Hidradenitis   - Rx Clinda 1% BID   Obesity   - BMI 49    Vital Signs:   Patient Vitals for the past 4 hrs:   BP Temp Temp src Pulse Resp   09/28/12 1700 131/68 mmHg 36.4 C (97.5 F) TEMPORAL 81 18        Physical Exam:  General: NAD. Resting comfortably in bed  HEENT: NC/AT  Cardiovascular: Regular rate and rhythm  Respiratory: Normal respiratory effort  Abdomen: soft, gravid, obese, non-tender  Extremities/Skin: mild edema    Pelvic Exam:  Visually closed and long    Sterile Speculum Exam:  Pooling: negative  Nitrazine: negative  Ferning: negative  Membranes:  intact    Cultures collected: Not Applicable    Wet prep:  Whiff: no     Clue cells: no              Trich: no       Yeast:  no      EFM:  Baseline:  135 bpm       Variability:  moderate     Accels: present     Decels: absent  Cat:  I  Toco:  none  Labor Assessment: not in labor    Labs:  UA:  pending    ASSESSMENT AND PLAN  26 y.o. [id number] at [redacted]w[redacted]d with intermittent blood in her urine.  -- NST reactive  -- UA pending  -- not ruptured, wet prep negative    Lanier Prude, MD  Ob/Gyn Resident R2  Pager: [phone]  09/28/2012   5:47 PM    R3 Addendum - Late Entry    Pt requesting d/c home.    UA:    Recent Labs  Lab 09/28/12  1729   Color, UA Yellow   Appearance,UR 1+  Cloudy*   Glucose,UA NORM   Ketones, UA NEG   Specific Gravity,UA 1.017   Blood,UA NEG   pH,UA 7.0   Protein,UA NEG   Nitrite,UA NEG   Leuk Esterase,UA NEG        Plan for d/c home with outpatient follow up. Precautions reviewed. Pt voices understanding and agreement with plan.    D/w Dr. Jon Billings and Dr. Maxie Barb, MD  Ob/Gyn R3  [phone]  09/29/2012  5:24 AM
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
## 2012-09-28 NOTE — Telephone Encounter (Signed)
Pt called answering service. Pt c/o pink urine. Pt denied pain with urination, any abd pian. occasional irregular ctx., pt dnies flank pain or abd pain. Pt denies leaking fluid other than when voiding. Referencing S.Betstadt MD pt informed should go to triage for evaluation for ROM and pink urine. Pt verbalized need to go to triage. Pt going to call for medicaid cab if unable to find a friend for ride. Pt aware she does not need ambulance ride.

## 2012-10-03 ENCOUNTER — Other Ambulatory Visit: Payer: Self-pay

## 2012-10-09 ENCOUNTER — Encounter: Payer: Self-pay | Admitting: Student in an Organized Health Care Education/Training Program

## 2012-10-10 ENCOUNTER — Telehealth: Payer: Self-pay

## 2012-10-10 ENCOUNTER — Ambulatory Visit: Payer: Self-pay | Admitting: Student in an Organized Health Care Education/Training Program

## 2012-10-10 ENCOUNTER — Other Ambulatory Visit: Payer: Self-pay | Admitting: Gastroenterology

## 2012-10-10 ENCOUNTER — Ambulatory Visit: Payer: Self-pay

## 2012-10-10 VITALS — BP 131/63 | Ht 65.0 in | Wt 285.0 lb

## 2012-10-10 DIAGNOSIS — R319 Hematuria, unspecified: Secondary | ICD-10-CM

## 2012-10-10 DIAGNOSIS — O10919 Unspecified pre-existing hypertension complicating pregnancy, unspecified trimester: Secondary | ICD-10-CM

## 2012-10-10 DIAGNOSIS — F32A Depression, unspecified: Secondary | ICD-10-CM

## 2012-10-10 DIAGNOSIS — Z01818 Encounter for other preprocedural examination: Secondary | ICD-10-CM

## 2012-10-10 DIAGNOSIS — O0990 Supervision of high risk pregnancy, unspecified, unspecified trimester: Secondary | ICD-10-CM

## 2012-10-10 DIAGNOSIS — Z98891 History of uterine scar from previous surgery: Secondary | ICD-10-CM

## 2012-10-10 DIAGNOSIS — O34219 Maternal care for unspecified type scar from previous cesarean delivery: Secondary | ICD-10-CM

## 2012-10-10 DIAGNOSIS — Z3480 Encounter for supervision of other normal pregnancy, unspecified trimester: Secondary | ICD-10-CM

## 2012-10-10 DIAGNOSIS — E669 Obesity, unspecified: Secondary | ICD-10-CM

## 2012-10-10 DIAGNOSIS — IMO0002 Reserved for concepts with insufficient information to code with codable children: Secondary | ICD-10-CM

## 2012-10-10 DIAGNOSIS — Z720 Tobacco use: Secondary | ICD-10-CM

## 2012-10-10 DIAGNOSIS — F1411 Cocaine abuse, in remission: Secondary | ICD-10-CM

## 2012-10-10 LAB — POCT URINALYSIS DIPSTICK
Bilirubin,Ur: NEGATIVE
Blood,UA POCT: NEGATIVE
Glucose,UA POCT: NORMAL
Ketones,UA POCT: NEGATIVE
Leuk Esterase,UA POCT: NEGATIVE
Lot #: 22453202
Nitrite,UA POCT: NEGATIVE
PH,UA POCT: 6 (ref 5–8)
Specific gravity,UA POCT: 1.015 (ref 1.002–1.03)
Urobilinogen,UA: NORMAL

## 2012-10-10 NOTE — Telephone Encounter (Signed)
[facility] OB patient returned call from the nurse.  Patient has been informed as to provider's response.  She is satisfied and stated she need no further call from the nurse at this time.
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
## 2012-10-10 NOTE — Telephone Encounter (Addendum)
Message copied by Elvis Coil on Tue Oct 10, 2012  1:21 PM    ------     Message from: Delynn Flavin A     Created: Tue Oct 10, 2012 10:38 AM         OBGYN SURGICAL ADMISSION FORM               Admission type:    SDA       Date of surgery/procedure: sched @ 39 wks - EDD 08.22.14 >   08/15       Surgery location:    [facility]              DOB:  Mar 08, 1986  AGE:  27 y.o.        Phone:  [phone] (home) [phone] (work)        Address:  [address]  [address]       [place] [address]               OBGYN Attending physician: MFM TBD > Fawn Kirk       OBGYN Resident physician:  OB Team        Second surgeon: n/a              Admitting diagnosis [ICD-9] :  H/o 2 prior c/s - 654.23       Additional medical condition: obesity  BMI 49 - 278.0       Procedure [CPT-4]: rLTCS - 59510       Special equipment: n/a              Insurance company:   [employer] Policy #:  [id number] Percert #:        Secondary insurance company: [employer] Policy #:  [id number] Precert #:               Allergies: Review of patient's allergies indicates no known allergies (drug, envir, food or latex).       Precautions: none        Medical justification for admission 1 day prior to surgery/procedure: none              Types of anesthesia: spinal       Estimated length of case: 1.5 hrs              Preadmission testing:       Type and screen       CBC                  Office preop with MD:        Preop testing to be done on / at:   10-26-12 @ [facility]        Clearance appointments / Consults: none       Ofc appt w/ MD - 1 weeks postop:    10/10/2012  Request emailed to OR scheduling.  Patient notification is pending OR confirmation [place]    10/11/2012   letter sent, order entry in [employer] [place] [employer]

## 2012-10-10 NOTE — Patient Instructions (Signed)
Common Discomforts: THIRD TRIMESTER    The last few weeks of your pregnancy are often the most uncomfortable.  Your growing baby is taking up more and more space.   Carrying around all that extra weight can make you more tired than usual.  As the baby and your uterus press up, down and out, other discomforts arise.     What are the most common discomforts during this last part of my pregnancy?  Most women have some or all of the following discomforts: edema (swelling), insomnia (unable to get to sleep or stay asleep), uncomfortable intercourse (sex), going to the bathroom a lot, shortness of breath and numbness or tingling in the fingers.   Discomforts you developed in your second trimester, such as leg cramps, constipation or hemorrhoids, may continue, get better or get worse.  Every woman is different and every pregnancy is different.     What can I do to prevent or relieve any discomforts?  No matter how careful you are, it is almost impossible to avoid all discomforts during your pregnancy.  Face it--carrying around a 6-10 pound baby and all that goes with it (placenta, uterus, bag of waters) in the tight space between your rib cage and your hips is bound to be a bit of a hassle!  There are many things you can do to make these last weeks easier on yourself.  This guide looks at each of most common discomforts.     Edema  Edema is swelling of any part of your body.  Your feet, ankles and hands most commonly swell during late pregnancy.  Sometimes swelling can be a sign of a problem.  (See the guide on Warning Signs-Second Trimester).  Sudden swelling, especially in your face or upper body, can be a warning sign.  Call your health care provider (HCP) right away if you have swelling in your face or above your waist.     Swelling also happens because your hormones make your blood vessels leak.  Also, the pressure of your baby on your hips sometimes keeps your blood from flowing well in your legs.  That is why your  feet and ankles may swell.  Eating normal amounts of salt on your food will not cause this, but taking salt pills or eating a lot of salt might make edema worse.    Here are some things to do that can help:   • Try not to wear tight clothing.  Tight waistbands or knee-high and thigh-high socks are especially a problem.  • Take rest periods often. Raise your legs higher than your heart if possible.  At the very least, put your feet up so they are level with your hips.  • Use support panty hose to help the blood flow in your legs  • Watch the amount of salt and salty foods in your diet.   • Call your HCP if the swelling keeps getting worse or happens very quickly.     Insomnia    Insomnia is not being able to sleep.  Sometimes people have trouble falling asleep.  Other people fall asleep, but wake up in the middle of the night and cannot get back to sleep.  Most women have changes in their sleep patterns during pregnancy.  Sometime the size of the baby makes it hard to find a comfortable position.   Pressure on your bladder may make you have to get up to go the bathroom at night.  Stress worrying about things   and being anxious can also keep you from sleeping well.    Here are some things that might help you sleep better:  • Avoid exciting activities before bedtime.   No relay races or scary movies!  • Take a warm bath.  • Try the side-lying position for rest and relaxation.  Some women prefer to support the upper leg with pillows.  • Read a dull book.  • Have someone give you a back rub or massage!  • Exercise before dinner or at least 3 hours before bedtime.   • Take a short nap during the day so you are not overly tired.  • Try not to drink things with caffeine, like coffee, tea, cola or other soda (read the labels).  • Sip on a glass of milk before bed or have a small bowl of cereal with milk.   • Keep a pad by your bed and write down things that are bothering you.  Some women cannot get to sleep for fear they will  forget what they are thinking about.     If there are things that are worrying you, talk to someone about your worries.  Your HCP might be able to give you some other ideas too!    Discomforts during sex    During this last trimester, it can be more challenging to enjoy “being with” your special someone.  Lots of things make a difference in how much you enjoy sex.   Pressure from the growing baby and changes in your vagina make sex feel different.  Your larger belly can get in the way! How you feel and think can change your feelings about sex too.  It is okay to have sex, usually all the way up until your baby’s birth.   You will not hurt the baby!    In some special cases, your HCP may tell you not to have sex or even sex play especially playing with nipples (this can lead to preterm labor).  Except in these special cases, though, you can enjoy sex as much as you and your partner like.        Here are some things that can help make you more comfortable:  • Try different positions.  Lying on your side of often helps.  • Use a water soluble vagina gel, like K-Y Jelly, to keep things slippery.   Do not use petroleum jelly or mineral oil.  Look for water soluble on the package.  • Talk about any fears or concerns you have with your partner or your HCP.   • Tell your HCP if you have any symptoms of a vaginal infection (itching, burning, pain with sex, strange discharge).  Be sure to follow all instructions for treatment.   • Find other ways to express your feelings.  Try new ways to give your partner pleasure if the sex act itself gets too uncomfortable.  See if there are ways he can make you feel better without sex too.    Frequent urination    You may find yourself having to go to the bathroom a lot more often now.  The baby’s head is pushing your bladder (urine storage area).  Your kidneys are also making more urine these days, because you have more blood flowing through them.    Sometimes you develop a urinary  infection.   If it happens, you will also find yourself    urinating more often.  But you may also notice burning   when you go and your urine may be cloudy or smell different than usual.  If any of these things happen, tell your HCP.  There are medicines that can help get rid of the infection and make you feel better.     Things you can do to be more comfortable are:  • Drink fluids often.  Do not cut back on fluids.  Do not drink all your liquids for 1 day at the same time.  Try carrying a large cup of water with you and sip on it often.   • Do not drink liquids with caffeine in them.  Too much caffeine may not be good for the baby.  Caffeine also makes you have to urinate more often.  Coffee and tea have caffeine--even the decaffeinated kind has a little.   Colas and other dark sodas often have caffeine.  Even some “clear” sodas have caffeine, so read the labels to see if caffeine is listed in the ingredients.  • Do Kegel exercises.   Squeeze the muscles in your bottom, then release.   Try to do 10 squeezes every time you think of it.   This will make it easier for you to hold your urine until you can get to a bathroom.    Shortness of breath    A feeling of having a hard time catching your breath occurs in this last trimester.  This is caused by the baby getting bigger and pushing up on your ribs and lungs.  This makes it hard for your lungs to stretch and let you take a deep breath.  Sometimes other problems can make this feeling worse.   If you have a cold with a fever, or if you have had other problems with your heart and lungs, tell your HCP if you get short of breath.  Otherwise here are some ways to breath easier:  • Do not do anything that you find makes you short of breath!  Try not to bend over for long periods.  Pace your exercise and walking so you do not have trouble catching your breath.  Take stairs more slowly.  • Do not wear clothing that is too tight.  Make sure you get larger bras and blouses as  you chest gets larger.   • If lying down makes it harder to breathe, add extra pillows under your head and back at night.   • Split up your activities to include more rest breaks.     Numbness or tingling in the fingers    Some women have strange feelings of numbness or tingling in their fingers toward the end of their pregnancy.  This can come from a change in your posture that puts pressure on the nerves to your hand and arms.  It is more common at night and in the early morning. Moving around or stretching often makes it go away.      It is not normal to have pain, loss of sensation where you cannot feel anything, or numbness that affects your whole hand or wrist.  Let your HCP know if this happens.   If the numbness happens along with swelling, especially first thing in the morning, call your HCP right away.     For normal numbness or tingling, some things that might help include the following:    • Try to keep your back straight.  Do not slouch.   • Raise your hand or hands over your head for a few   minutes when numbness or tingling occurs.   • Do stretching and relaxation exercises for your shoulders, arms and hands.  • Sleep with your hands and forearms up on a pillow.    • If you use a computer a lot, use a wrist pad when typing.   • Ask your HCP about using a wrist splint if the problem does not get better.     Most women have some amount of discomfort as their pregnancy reaches the final weeks.   Talk to your HCP about any discomforts or concerns you have.  Remember--you are now only a few short weeks away from having that baby in your arms.     Reviewed 12/2009

## 2012-10-10 NOTE — Telephone Encounter (Signed)
Patient seen in clinic today for routine OBC. After her appointment was completed, she asked to see the provider again, who at that time, was seeing another patient.     Nurse speaks to [place], who stated she would like a letter to excuse her from chemical dependency group until after she delivers. She stated she wanted to be excused because she gets tired and in particular it was difficult for her to go up stairs. ( presumably her group is located somewhere where she needs to climb stairs to attend) Told patient I would discuss her request with provider and call her with response, as the patient was waiting on a ride and had another appointment this afternoon she was anxious to get to.    Discussed with Dr. Harold Hedge: there is no medical reason to excuse patient from attendance of her Chemical Dependency Group.    Call placed and voicemail left for patient asking her to call office back so she may be given this information.

## 2012-10-11 LAB — GROUP B STREP CULTURE: Group B Strep Culture: DETECTED

## 2012-10-11 NOTE — Addendum Note (Signed)
Addended by: Elvis Coil on: 10/11/2012 02:21 PM     Modules accepted: Orders

## 2012-10-12 ENCOUNTER — Encounter: Payer: Self-pay | Admitting: Student in an Organized Health Care Education/Training Program

## 2012-10-16 ENCOUNTER — Encounter: Payer: Self-pay | Admitting: Student in an Organized Health Care Education/Training Program

## 2012-10-17 ENCOUNTER — Ambulatory Visit: Payer: Self-pay | Admitting: Student in an Organized Health Care Education/Training Program

## 2012-10-17 VITALS — BP 110/70 | Ht 65.0 in | Wt 282.0 lb

## 2012-10-17 DIAGNOSIS — E669 Obesity, unspecified: Secondary | ICD-10-CM

## 2012-10-17 DIAGNOSIS — Z720 Tobacco use: Secondary | ICD-10-CM

## 2012-10-17 DIAGNOSIS — O0990 Supervision of high risk pregnancy, unspecified, unspecified trimester: Secondary | ICD-10-CM

## 2012-10-17 DIAGNOSIS — L732 Hidradenitis suppurativa: Secondary | ICD-10-CM

## 2012-10-17 DIAGNOSIS — IMO0002 Reserved for concepts with insufficient information to code with codable children: Secondary | ICD-10-CM

## 2012-10-17 DIAGNOSIS — F32A Depression, unspecified: Secondary | ICD-10-CM

## 2012-10-17 DIAGNOSIS — F1411 Cocaine abuse, in remission: Secondary | ICD-10-CM

## 2012-10-17 DIAGNOSIS — Z98891 History of uterine scar from previous surgery: Secondary | ICD-10-CM

## 2012-10-17 LAB — POCT URINALYSIS DIPSTICK
Bilirubin,Ur: NEGATIVE
Blood,UA POCT: NEGATIVE
Exp date: 62015
Glucose,UA POCT: NORMAL
Ketones,UA POCT: NEGATIVE
Lot #: 22453202
Nitrite,UA POCT: NEGATIVE
PH,UA POCT: 7 (ref 5–8)
Protein,UA POCT: NEGATIVE mg/dL
Specific gravity,UA POCT: 1.01 (ref 1.002–1.03)
Urobilinogen,UA: NORMAL
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
## 2012-10-17 NOTE — Patient Instructions (Signed)
Common Discomforts: THIRD TRIMESTER    The last few weeks of your pregnancy are often the most uncomfortable.  Your growing baby is taking up more and more space.   Carrying around all that extra weight can make you more tired than usual.  As the baby and your uterus press up, down and out, other discomforts arise.     What are the most common discomforts during this last part of my pregnancy?  Most women have some or all of the following discomforts: edema (swelling), insomnia (unable to get to sleep or stay asleep), uncomfortable intercourse (sex), going to the bathroom a lot, shortness of breath and numbness or tingling in the fingers.   Discomforts you developed in your second trimester, such as leg cramps, constipation or hemorrhoids, may continue, get better or get worse.  Every woman is different and every pregnancy is different.     What can I do to prevent or relieve any discomforts?  No matter how careful you are, it is almost impossible to avoid all discomforts during your pregnancy.  Face it--carrying around a 6-10 pound baby and all that goes with it (placenta, uterus, bag of waters) in the tight space between your rib cage and your hips is bound to be a bit of a hassle!  There are many things you can do to make these last weeks easier on yourself.  This guide looks at each of most common discomforts.     Edema  Edema is swelling of any part of your body.  Your feet, ankles and hands most commonly swell during late pregnancy.  Sometimes swelling can be a sign of a problem.  (See the guide on Warning Signs-Second Trimester).  Sudden swelling, especially in your face or upper body, can be a warning sign.  Call your health care provider (HCP) right away if you have swelling in your face or above your waist.     Swelling also happens because your hormones make your blood vessels leak.  Also, the pressure of your baby on your hips sometimes keeps your blood from flowing well in your legs.  That is why your  feet and ankles may swell.  Eating normal amounts of salt on your food will not cause this, but taking salt pills or eating a lot of salt might make edema worse.    Here are some things to do that can help:   • Try not to wear tight clothing.  Tight waistbands or knee-high and thigh-high socks are especially a problem.  • Take rest periods often. Raise your legs higher than your heart if possible.  At the very least, put your feet up so they are level with your hips.  • Use support panty hose to help the blood flow in your legs  • Watch the amount of salt and salty foods in your diet.   • Call your HCP if the swelling keeps getting worse or happens very quickly.     Insomnia    Insomnia is not being able to sleep.  Sometimes people have trouble falling asleep.  Other people fall asleep, but wake up in the middle of the night and cannot get back to sleep.  Most women have changes in their sleep patterns during pregnancy.  Sometime the size of the baby makes it hard to find a comfortable position.   Pressure on your bladder may make you have to get up to go the bathroom at night.  Stress worrying about things   and being anxious can also keep you from sleeping well.    Here are some things that might help you sleep better:  • Avoid exciting activities before bedtime.   No relay races or scary movies!  • Take a warm bath.  • Try the side-lying position for rest and relaxation.  Some women prefer to support the upper leg with pillows.  • Read a dull book.  • Have someone give you a back rub or massage!  • Exercise before dinner or at least 3 hours before bedtime.   • Take a short nap during the day so you are not overly tired.  • Try not to drink things with caffeine, like coffee, tea, cola or other soda (read the labels).  • Sip on a glass of milk before bed or have a small bowl of cereal with milk.   • Keep a pad by your bed and write down things that are bothering you.  Some women cannot get to sleep for fear they will  forget what they are thinking about.     If there are things that are worrying you, talk to someone about your worries.  Your HCP might be able to give you some other ideas too!    Discomforts during sex    During this last trimester, it can be more challenging to enjoy “being with” your special someone.  Lots of things make a difference in how much you enjoy sex.   Pressure from the growing baby and changes in your vagina make sex feel different.  Your larger belly can get in the way! How you feel and think can change your feelings about sex too.  It is okay to have sex, usually all the way up until your baby’s birth.   You will not hurt the baby!    In some special cases, your HCP may tell you not to have sex or even sex play especially playing with nipples (this can lead to preterm labor).  Except in these special cases, though, you can enjoy sex as much as you and your partner like.        Here are some things that can help make you more comfortable:  • Try different positions.  Lying on your side of often helps.  • Use a water soluble vagina gel, like K-Y Jelly, to keep things slippery.   Do not use petroleum jelly or mineral oil.  Look for water soluble on the package.  • Talk about any fears or concerns you have with your partner or your HCP.   • Tell your HCP if you have any symptoms of a vaginal infection (itching, burning, pain with sex, strange discharge).  Be sure to follow all instructions for treatment.   • Find other ways to express your feelings.  Try new ways to give your partner pleasure if the sex act itself gets too uncomfortable.  See if there are ways he can make you feel better without sex too.    Frequent urination    You may find yourself having to go to the bathroom a lot more often now.  The baby’s head is pushing your bladder (urine storage area).  Your kidneys are also making more urine these days, because you have more blood flowing through them.    Sometimes you develop a urinary  infection.   If it happens, you will also find yourself    urinating more often.  But you may also notice burning   when you go and your urine may be cloudy or smell different than usual.  If any of these things happen, tell your HCP.  There are medicines that can help get rid of the infection and make you feel better.     Things you can do to be more comfortable are:  • Drink fluids often.  Do not cut back on fluids.  Do not drink all your liquids for 1 day at the same time.  Try carrying a large cup of water with you and sip on it often.   • Do not drink liquids with caffeine in them.  Too much caffeine may not be good for the baby.  Caffeine also makes you have to urinate more often.  Coffee and tea have caffeine--even the decaffeinated kind has a little.   Colas and other dark sodas often have caffeine.  Even some “clear” sodas have caffeine, so read the labels to see if caffeine is listed in the ingredients.  • Do Kegel exercises.   Squeeze the muscles in your bottom, then release.   Try to do 10 squeezes every time you think of it.   This will make it easier for you to hold your urine until you can get to a bathroom.    Shortness of breath    A feeling of having a hard time catching your breath occurs in this last trimester.  This is caused by the baby getting bigger and pushing up on your ribs and lungs.  This makes it hard for your lungs to stretch and let you take a deep breath.  Sometimes other problems can make this feeling worse.   If you have a cold with a fever, or if you have had other problems with your heart and lungs, tell your HCP if you get short of breath.  Otherwise here are some ways to breath easier:  • Do not do anything that you find makes you short of breath!  Try not to bend over for long periods.  Pace your exercise and walking so you do not have trouble catching your breath.  Take stairs more slowly.  • Do not wear clothing that is too tight.  Make sure you get larger bras and blouses as  you chest gets larger.   • If lying down makes it harder to breathe, add extra pillows under your head and back at night.   • Split up your activities to include more rest breaks.     Numbness or tingling in the fingers    Some women have strange feelings of numbness or tingling in their fingers toward the end of their pregnancy.  This can come from a change in your posture that puts pressure on the nerves to your hand and arms.  It is more common at night and in the early morning. Moving around or stretching often makes it go away.      It is not normal to have pain, loss of sensation where you cannot feel anything, or numbness that affects your whole hand or wrist.  Let your HCP know if this happens.   If the numbness happens along with swelling, especially first thing in the morning, call your HCP right away.     For normal numbness or tingling, some things that might help include the following:    • Try to keep your back straight.  Do not slouch.   • Raise your hand or hands over your head for a few   minutes when numbness or tingling occurs.   • Do stretching and relaxation exercises for your shoulders, arms and hands.  • Sleep with your hands and forearms up on a pillow.    • If you use a computer a lot, use a wrist pad when typing.   • Ask your HCP about using a wrist splint if the problem does not get better.     Most women have some amount of discomfort as their pregnancy reaches the final weeks.   Talk to your HCP about any discomforts or concerns you have.  Remember--you are now only a few short weeks away from having that baby in your arms.     Reviewed 12/2009

## 2012-10-24 ENCOUNTER — Encounter: Payer: Self-pay | Admitting: Obstetrics and Gynecology

## 2012-10-24 ENCOUNTER — Ambulatory Visit: Payer: Self-pay | Admitting: Obstetrics and Gynecology

## 2012-10-24 VITALS — BP 110/80 | Ht 65.0 in | Wt 282.0 lb

## 2012-10-24 DIAGNOSIS — L732 Hidradenitis suppurativa: Secondary | ICD-10-CM

## 2012-10-24 DIAGNOSIS — F1411 Cocaine abuse, in remission: Secondary | ICD-10-CM

## 2012-10-24 DIAGNOSIS — O10919 Unspecified pre-existing hypertension complicating pregnancy, unspecified trimester: Secondary | ICD-10-CM

## 2012-10-24 DIAGNOSIS — O0990 Supervision of high risk pregnancy, unspecified, unspecified trimester: Secondary | ICD-10-CM

## 2012-10-24 DIAGNOSIS — E669 Obesity, unspecified: Secondary | ICD-10-CM

## 2012-10-24 DIAGNOSIS — F32A Depression, unspecified: Secondary | ICD-10-CM

## 2012-10-24 DIAGNOSIS — Z98891 History of uterine scar from previous surgery: Secondary | ICD-10-CM

## 2012-10-24 DIAGNOSIS — Z720 Tobacco use: Secondary | ICD-10-CM

## 2012-10-24 DIAGNOSIS — Z3480 Encounter for supervision of other normal pregnancy, unspecified trimester: Secondary | ICD-10-CM

## 2012-10-24 DIAGNOSIS — IMO0002 Reserved for concepts with insufficient information to code with codable children: Secondary | ICD-10-CM

## 2012-10-24 LAB — POCT URINALYSIS DIPSTICK
Bilirubin,Ur: NEGATIVE
Blood,UA POCT: NEGATIVE
Exp date: 62015
Glucose,UA POCT: NORMAL
Ketones,UA POCT: NEGATIVE
Leuk Esterase,UA POCT: NEGATIVE
Lot #: 22453202
Nitrite,UA POCT: NEGATIVE
PH,UA POCT: 7 (ref 5–8)
Protein,UA POCT: NEGATIVE mg/dL
Specific gravity,UA POCT: 1.01 (ref 1.002–1.03)
Urobilinogen,UA: NORMAL

## 2012-10-24 LAB — DRUG SCREEN CHEMICAL DEPENDENCY, URINE
Amphetamine,UR: NEGATIVE
Benzodiazepinen,UR: NEGATIVE
Cocaine/Metab,UR: NEGATIVE
Opiates,UR: NEGATIVE
THC Metabolite,UR: NEGATIVE
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
## 2012-10-25 ENCOUNTER — Ambulatory Visit
Admit: 2012-10-25 | Discharge: 2012-10-25 | Disposition: A | Discharge status: Home / Self Care | Payer: Self-pay | Source: Ambulatory Visit | Attending: Obstetrics and Gynecology | Admitting: Obstetrics and Gynecology

## 2012-10-25 DIAGNOSIS — Z3480 Encounter for supervision of other normal pregnancy, unspecified trimester: Secondary | ICD-10-CM

## 2012-10-25 DIAGNOSIS — O34219 Maternal care for unspecified type scar from previous cesarean delivery: Secondary | ICD-10-CM

## 2012-10-25 DIAGNOSIS — Z01818 Encounter for other preprocedural examination: Secondary | ICD-10-CM

## 2012-10-25 LAB — CBC
Hematocrit: 37 % (ref 34–45)
Hemoglobin: 12.1 g/dL (ref 11.2–15.7)
MCV: 94 fL (ref 79–95)
Platelets: 235 10*3/uL (ref 160–370)
RBC: 4 MIL/uL (ref 3.9–5.2)
RDW: 13.9 % (ref 11.7–14.4)
WBC: 9.6 10*3/uL (ref 4.0–10.0)

## 2012-10-25 LAB — TYPE AND SCREEN
ABO RH Blood Type: A POS
Antibody Screen: NEGATIVE

## 2012-10-26 LAB — RESOLUTION

## 2012-10-26 NOTE — Progress Notes (Signed)
SW Note:  Jenna [name] requested to see SW today. She reports that she is doing well. She has moved to a new apartment at [address] [phone], and is preparing for the baby. She reports she has a place for baby to sleep. She is requesting assistance with a car seat. She is scheduled for C/S on 8/15. Explained to patient that two days is short notice. Can initiate car seat referral and she can call to inquire about time frame.  Plan:  Completed [facility] car-seat referral form and faxed to office. Provided patient with copy and encouraged her to call county. SW is available for duration of the pregnancy.  Denny Peon. [place], LMSW, [phone], beeper [phone]

## 2012-10-27 ENCOUNTER — Encounter: Payer: Self-pay | Admitting: Obstetrics and Gynecology

## 2012-10-27 ENCOUNTER — Inpatient Hospital Stay
Admit: 2012-10-27 | Disposition: A | Discharge status: Home / Self Care | Payer: Self-pay | Source: Ambulatory Visit | Attending: Maternal & Fetal Medicine | Admitting: Maternal & Fetal Medicine

## 2012-10-27 DIAGNOSIS — Z98891 History of uterine scar from previous surgery: Secondary | ICD-10-CM

## 2012-10-27 LAB — CHEMICAL DEPENDENCY SCREEN 8, URINE
Amphetamine,UR: NEGATIVE
Barbiturate,UR: NEGATIVE
Benzodiazepinen,UR: NEGATIVE
Cocaine/Metab,UR: NEGATIVE
Methadone Metab,UR: NEGATIVE
Opiates,UR: POSITIVE
PCP,UR: NEGATIVE
Propoxyphene,UR: NEGATIVE
THC Metabolite,UR: NEGATIVE

## 2012-10-27 LAB — POCT GLUCOSE: Glucose POCT: 95 mg/dL (ref 60–99)

## 2012-10-27 MED ORDER — IBUPROFEN 600 MG PO TABS *I*
600.0000 mg | ORAL_TABLET | Freq: Three times a day (TID) | ORAL | Status: DC | PRN
Start: 2012-10-27 — End: 2012-10-30

## 2012-10-27 MED ORDER — DOCUSATE SODIUM 100 MG PO CAPS *I*
100.0000 mg | ORAL_CAPSULE | Freq: Two times a day (BID) | ORAL | Status: DC | PRN
Start: 2012-10-27 — End: 2012-10-29
  Administered 2012-10-28 (×2): 100 mg via ORAL
  Filled 2012-10-27 (×2): qty 1

## 2012-10-27 MED ORDER — KETAMINE HCL 50 MG/ML IJ SOLN *I*
10.0000 mg | INTRAMUSCULAR | Status: AC | PRN
Start: 2012-10-27 — End: 2012-10-28

## 2012-10-27 MED ORDER — CLINDAMYCIN PHOSPHATE 1 % EX SOLN *I*
Freq: Two times a day (BID) | CUTANEOUS | Status: DC
Start: 2012-10-27 — End: 2012-10-28
  Filled 2012-10-27: qty 60

## 2012-10-27 MED ORDER — IBUPROFEN 600 MG PO TABS *I*
600.0000 mg | ORAL_TABLET | Freq: Four times a day (QID) | ORAL | Status: DC | PRN
Start: 2012-10-28 — End: 2012-10-30
  Administered 2012-10-28 – 2012-10-30 (×6): 600 mg via ORAL
  Filled 2012-10-27 (×6): qty 1

## 2012-10-27 MED ORDER — OXYCODONE-ACETAMINOPHEN 5-325 MG PO TABS *I*
1.0000 | ORAL_TABLET | ORAL | Status: DC | PRN
Start: 2012-10-27 — End: 2012-10-29

## 2012-10-27 MED ORDER — LACTATED RINGERS IV SOLN *I*
100.0000 mL/h | INTRAVENOUS | Status: AC
Start: 2012-10-27 — End: 2012-10-28
  Administered 2012-10-27: 50 mL/h via INTRAVENOUS
  Administered 2012-10-27: 100 mL/h via INTRAVENOUS

## 2012-10-27 MED ORDER — KETOROLAC TROMETHAMINE 30 MG/ML IJ SOLN *I*
30.0000 mg | Freq: Four times a day (QID) | INTRAMUSCULAR | Status: AC
Start: 2012-10-27 — End: 2012-10-28
  Administered 2012-10-27 – 2012-10-28 (×3): 30 mg via INTRAVENOUS
  Filled 2012-10-27 (×3): qty 1

## 2012-10-27 MED ORDER — OXYCODONE-ACETAMINOPHEN 5-325 MG PO TABS *I*
1.0000 | ORAL_TABLET | ORAL | Status: DC | PRN
Start: 2012-10-27 — End: 2012-10-30

## 2012-10-27 MED ORDER — HYDROMORPHONE HCL 2 MG/ML IJ SOLN *WRAPPED*
INTRAMUSCULAR | Status: AC
Start: 2012-10-27 — End: 2012-10-27
  Filled 2012-10-27: qty 1

## 2012-10-27 MED ORDER — OXYTOCIN 30 UNITS/500 ML NS *POSTPARTUM* *I*
100.0000 m[IU]/min | INTRAMUSCULAR | Status: DC
Start: 2012-10-27 — End: 2012-10-30
  Administered 2012-10-27: 100 m[IU]/min via INTRAVENOUS

## 2012-10-27 MED ORDER — HYDROMORPHONE HCL 2 MG/ML IJ SOLN *WRAPPED*
0.4000 mg | INTRAMUSCULAR | Status: AC | PRN
Start: 2012-10-27 — End: 2012-10-28
  Administered 2012-10-27 (×5): 0.4 mg via INTRAVENOUS

## 2012-10-27 MED ORDER — RETIRED-HYDROMORPHONE PCA 1 MG/ML
INTRAMUSCULAR | Status: DC
Start: 2012-10-27 — End: 2012-10-28
  Administered 2012-10-27: 25 mg via INTRAVENOUS
  Filled 2012-10-27: qty 25

## 2012-10-27 MED ORDER — PROMETHAZINE HCL 25 MG/ML IJ SOLN *I*
6.2500 mg | Freq: Once | INTRAMUSCULAR | Status: AC | PRN
Start: 2012-10-27 — End: 2012-10-27

## 2012-10-27 MED ORDER — RETIRED-HYDROMORPHONE PCA 1 MG/ML
INTRAMUSCULAR | Status: DC
Start: 2012-10-27 — End: 2012-10-27
  Administered 2012-10-27: 25 mg via INTRAVENOUS
  Filled 2012-10-27: qty 25

## 2012-10-27 MED ORDER — ONDANSETRON HCL 2 MG/ML IV SOLN *I*
1.0000 mg | Freq: Once | INTRAMUSCULAR | Status: AC | PRN
Start: 2012-10-27 — End: 2012-10-27

## 2012-10-27 MED ORDER — OXYCODONE-ACETAMINOPHEN 5-325 MG PO TABS *I*
2.0000 | ORAL_TABLET | ORAL | Status: DC | PRN
Start: 2012-10-27 — End: 2012-10-29
  Filled 2012-10-27 (×3): qty 2

## 2012-10-27 MED ORDER — DOCUSATE SODIUM 100 MG PO CAPS *I*
100.0000 mg | ORAL_CAPSULE | Freq: Two times a day (BID) | ORAL | Status: DC
Start: 2012-10-27 — End: 2012-10-30

## 2012-10-27 MED ORDER — CEFAZOLIN SODIUM 1000 MG IJ SOLR *I*
2000.0000 mg | Freq: Once | INTRAMUSCULAR | Status: AC
Start: 2012-10-27 — End: 2012-10-27
  Administered 2012-10-27: 2000 mg via INTRAVENOUS
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
## 2012-10-27 NOTE — Progress Notes (Addendum)
OB Postoperative Note:     Postpartum Day: 0    S:  Patient without complaints. Tolerating regular diet. Denies chest pain, shortness of breath, nausea and vomiting.  Pain is well controlled. Ambulating without difficulty.  Minimal to moderate lochia.  Flatus: absent. Foley in place, draining small amount of dark yellow urine.    O:   Filed Vitals:    10/27/12 2148   BP: 114/59   Pulse: 95   Temp: 36.7 C (98.1 F)   Resp: 16       Range:   BP: (112-145)/(53-86)   Temp:  [35.8 C (96.4 F)-36.8 C (98.2 F)]   Temp src:  [-]   Heart Rate:  [71-95]   Resp:  [15-20]   SpO2:  [100 %]      I/Os:  I/O last 3 completed shifts:  08/14 1500 - 08/15 1459  In: -   Out: 1100 [Urine:300; Blood:800]  Net: -1100  I/O this shift:  08/15 1500 - 08/15 2259  In: 3 [I.V.:3]  Out: 200 [Urine:200]  Net: -197    Physical Exam:  General: Appears stated age, well appearing and in NAD  HEENT: Normocephalic, atraumatic  Cardiovascular: Regular rate and rhythm with no murmurs  Respiratory: Clear to auscultate  Abdomen: Soft, appropriately tender, nondistended, +BS  Neurological: alert and oriented  Extremities/Skin: Minimal edema of pedal and pretibial (1+)  Fundus:  Fundus firm at umbilicus +2  Incision: Clean, dry and intact    Labs:    Recent Labs  Lab 10/25/12  1457   Hematocrit 37       A/P:  28 y.o. [id number] on POD# 0 s/p repeat cesarean section.  Doing well.  1) Continue routine postpartum care  2) Increase IVF to 100cc/hr, follow UOP  2) Increase ambulation  3) Infant gender: female  4) Feeding type: bottle  5) PPBC: Nexplanon  6) Rh: positive / Rubella: immune  7) Postpartum HCT/CBC: pending  8) Dispo: Plan for d/c home POD#3    Samara Snide, MD 10:33 PM   R1, Obstetrics and Gynecology  pager# [phone]
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
## 2012-10-27 NOTE — Anesthesia Post-procedure Eval (Signed)
Anesthesia Post-op Note    Patient: Jenna [name]    Procedure(s) Performed: section, repair    Anesthesia type: General    Patient location: PACU    Mental Status: Recovered to baseline    Patient able to participate in this evaluation: yes  Last Vitals: BP: 123/70 mmHg (10/27/12 1600)  BP MAP : 83 mmHg (10/27/12 1600)  Heart Rate: 74 (10/27/12 1600)  Temp: 36.2 C (97.2 F) (10/27/12 1600)  Resp: 16 (10/27/12 1600)  SpO2: 100 % (10/27/12 1600)      Post-op vital signs noted above are within patient's normal range  Post-op vitals signs: stable  Respiratory function: baseline    Airway patent: Yes    Cardiovascular and hydration status stable: Yes    Post-Op pain: Adequate analgesia    Post-Op nausea and vomiting: none    Post-Op assessment: no apparent anesthetic complications, tolerated procedure well and no evidence of recall    Complications: none    Attending Attestation: All indicated post anesthesia care provided    Author: Allie Bossier, MD  as of: 10/27/2012  at: 4:23 PM

## 2012-10-27 NOTE — Op Note (Addendum)
OPERATIVE NOTE     Date of Surgery: 10/27/2012    Surgeon: Lonny Prude, MD  First Assistant: Harlin Heys, MD      Pre-Op Diagnosis:   1. IUP pregnancy at [redacted]w[redacted]d  2. Ho LTCS x 2   3. Obese    Anesthesia Type: General due to failed Spinal placement     Medications received in the OR:  ancef prior to skin incision     Post-Op Diagnosis   1. IUP pregnancy at [redacted]w[redacted]d   2. Ho LTCS  3. Obese    Operative Procedure:   Repeat low transverse C-section    Estimated Blood Loss: 800 cc   Packing: No   Drains: Foley catheter, plan for removal on POD#1  Fluid Totals: Intakes: 4000 cc  Output: 300 cc   Specimens to Pathology: Placenta sent  Patient Condition: good     Indications for Procedure:   The patient is a 27 y.o. [id number] admitted at [redacted]w[redacted]d for repeat LTCS for ho LTCSx2.  Pregnancy complicated by ho cocaine abuse negative 8/12, chronic hypertension on no medications, and BMI 49. Pre-operatively, consent was signed.      Operative Findings:  Normal uterus, fallopian tubes and ovaries bilaterally.  Viable baby boy infant weighing 3700 grams with APGARs of 4 & 9.  Intact placenta with 3-vessel cord.    Description of Procedure:   The patient was taken to the operating room and placed on the operating room table. A stop check was performed confirming the patient and the procedure to be completed. Fetal heart tones were noted to be in the 140s.  Spinal anesthesia was attempted however could not be placed.  The decision was made at that time to place patient under general anesthesia just prior to skin incision.     The patient's abdomen was prepped and draped in the usual sterile fashion.  The surgical pause was completed. A low transverse Pfannenstiel skin incision was made and carried down sharply to the fascia. The fascia was nicked in the midline and the incision was extended transversely. The fascia was dissected off the rectus muscle using sharp and blunt dissection. The rectus muscle was divided along the midline and the  peritoneum was isolated and incised. The incision was extended superiorly and inferiorly with good visualization of the bladder. The bladder blade was placed to protect the bladder and a bladder flap was created.     A low transverse uterine incision was made and extended transversely by cephalo-caudad stretching.  The amniotic fluid was noted to be meconium stained at the time of rupture. The fetal head was elevated but would not deliver with external fundal pressure.  Attempt was then made to deliver the baby with vacuum assistance however baby was not delivered after three attempts ended in three pop-offs.  The baby was then delivered breech without complication.  The infant's mouth and nares were bulb suctioned on the abdomen. The infant's cord was clamped and cut, and the infant was passed to the awaiting personnel. The placenta was then delivered spontaneously with the help of fundal massage.     The uterus was then exteriorized and cleared of all clots and debris. The uterine incision was re-approximated with interlocking suture of 0 Vicryl followed by a second imbricating layer of 0 Vicryl. After closure of the uterine incision the first lap count revealed 1 missing lap.  This lap could not be found.  The uterine incision was re-opened by carefully cutting both suture layers.  There was no lap found in uterine cavity.  The uterus was again re-approximated with interlocking suture of 0 Vicryl followed by a second imbricating layer of 0 Vicryl.  Good hemostasis was noted. The lap was later found under the surgical drape.      The uterus was then placed back into the abdominal cavity and was inspected with good hemostasis noted.  A wound sweep was performed noting no retained instruments or sponges.     Attention was then turned to the fascia, which was re-approximated with a running suture of 0 Vicryl. The subcutaneous layer was hemostatic. The subcutaneous layer was re-approximated with 3-0 suture of Catgut.  The skin was re-approximated with suture. A sterile dressing was applied.     The patient tolerated the procedure well, and was taken to the Post Anesthesia Care Unit in stable and satisfactory condition.   All sponge, needle and instrument counts were correct at the end of the procedure x2. The foley was draining clear urine at the end of the procedure.     Harlin Heys, MD  Ob-Gyn PGY-2  Pager: [phone]      Signed: Harlin Heys, MD on 10/27/2012 at 12:13 PM     I was present throughout the entire procedure.    Lonny Prude, MD

## 2012-10-27 NOTE — Progress Notes (Signed)
Report given to [facility] on [id number] - IV patent, instructed on PCA encouraged to use PCA for pain, fundus firm @ U, scant vaginal flow, IV patent with pitocin. Resting when undisturbed. Foley patent

## 2012-10-28 LAB — SYPHILIS SCREEN
Syphilis Screen: NEGATIVE
Syphilis Status: NONREACTIVE

## 2012-10-28 LAB — CBC
Hematocrit: 31 % — ABNORMAL LOW (ref 34–45)
Hemoglobin: 9.9 g/dL — ABNORMAL LOW (ref 11.2–15.7)
MCV: 95 fL (ref 79–95)
Platelets: 195 10*3/uL (ref 160–370)
RBC: 3.3 MIL/uL — ABNORMAL LOW (ref 3.9–5.2)
RDW: 14 % (ref 11.7–14.4)
WBC: 14 10*3/uL — ABNORMAL HIGH (ref 4.0–10.0)

## 2012-10-28 MED ORDER — DIPHENHYDRAMINE HCL 50 MG/ML IJ SOLN *I*
12.5000 mg | Freq: Four times a day (QID) | INTRAMUSCULAR | Status: DC | PRN
Start: 2012-10-28 — End: 2012-10-30
  Administered 2012-10-28: 12.5 mg via INTRAVENOUS
  Filled 2012-10-28: qty 1

## 2012-10-28 MED ORDER — OXYCODONE-ACETAMINOPHEN 5-325 MG PO TABS *I*
2.0000 | ORAL_TABLET | ORAL | Status: DC | PRN
Start: 2012-10-28 — End: 2012-10-29
  Administered 2012-10-28 – 2012-10-29 (×7): 2 via ORAL
  Filled 2012-10-28 (×3): qty 2

## 2012-10-28 MED ORDER — OXYCODONE-ACETAMINOPHEN 5-325 MG PO TABS *I*
1.0000 | ORAL_TABLET | ORAL | Status: DC | PRN
Start: 2012-10-28 — End: 2012-10-29
  Filled 2012-10-28: qty 2

## 2012-10-28 NOTE — Progress Notes (Signed)
Pt reported to writer that she passed a large clot in toilet after removal of foley.  In toilet it looked to be about the size of a softball.  Fundus difficult to palpate, writer informed Dr. Sharion Balloon who came to evaluate patient.  Pt stated she's been changing her pad "every 5 mins" and that it is saturated, writer witnessed pad change after an hour with scant-moderate lochia.  Pt instructed to report to nurse when she changes her pad so that we can monitor lochia better.  Pt also instructed to void in hat to measure UOP after throwing away the first hat.  Pt stated understanding.  Will continue to monitor.

## 2012-10-28 NOTE — Progress Notes (Signed)
Obstetric Postpartum Progress Note: Cesarean Delivery    Postpartum Day: 1    S:  Came to patient bedside to evaluate concern for passage of clot. She passed ~75 to 100cc of clot with first void after foley was removed. She reports she is changing her pad frequently but nursing does confirm current pad has been on for more than an hour with minimal staining.     O:     Vitals:   Filed Vitals:    10/28/12 0417 10/28/12 0735 10/28/12 0900 10/28/12 1100   BP: 117/58 114/62     Pulse: 81 92     Temp: 36.6 C (97.9 F) 36.5 C (97.7 F)     TempSrc: Temporal Temporal     Resp: 18 18 18 18    SpO2:         AFVSS    Physical Exam:  General: Comfortable and well appearing  HEENT: Normocephalic and atraumatic  Cardiovascular: Normal S1S2 without murmur/gallop/rub   Respiratory: Clear to auscultation bilaterally  Abdomen: Soft, apprpriately tender, non-distended, +BS; obese  Extremities/Skin: trace edema  Fundus: Firm fundus at umbilicus -1, position confirmed by scratch test given obese abdomen. Other palpable abdominal structures likely large bowel (constipated prior to delivery).     Labs:    Recent Labs  Lab 10/28/12  0711 10/25/12  1457   WBC 14.0* 9.6   Hemoglobin 9.9* 12.1   Hematocrit 31* 37   Platelets 195 235        Recent Labs  Lab 10/27/12  1535   Glucose POCT 95       A/P:  27 y.o. [id number] on POD# 1 s/p rLTCS .  Doing well. Bleeding does seen appropriate, likely more clot from prolonged collection of discharge in vagina. Will continue to monitor, including nursing assessment of pads during the next few changes.     Nathaniel Wakeley J. Sharion Balloon, M.D.  Ob-Gyn Resident R2  Pager [phone]  10/28/2012 1:01 PM

## 2012-10-28 NOTE — Progress Notes (Signed)
SW met with pt. Pt is known to [facility] and has had SW involvement. At this time, pt states that the [name] is claiming the newborn is not his child, pt wants to do a paternity test. Pt has support from friends and family, evident as there were three guests in the room, two sisters and a female friend. Pt reports stable housing and receives public assistance. Pt has three older children who are with her aunt. Has WIC appt in late August. Pt states she attends [employer] three days a week and is on a six week medical leave at this time. Pt is willing to accept any help regarding obtaining baby items. Pt denies CPS involvement at this time. Pt was very open and engaging with SW.     Lily Lovings, LMSW  [phone]

## 2012-10-28 NOTE — Progress Notes (Signed)
SW notified by OB/GYN inpatient dept that pt's sister and her toddler need transportation back home as toddler cannot remain in hospital with pt overnight.  SW provided pt's sister with 2 bus passes via tube.  No other SW needs at this time.      Antanette Richwine [place], MSW  [phone]

## 2012-10-28 NOTE — Progress Notes (Addendum)
OB Postpartum Progress Note:     Postpartum Day: 1    S:  Patient without complaints. Tolerating regular diet. Denies chest pain, shortness of breath, nausea and vomiting.  Pain is well controlled. Ambulating and voiding without difficulty.  Moderate lochia.  Flatus absent.     O:   Filed Vitals:    10/28/12 0735   BP: 114/62   Pulse: 92   Temp: 36.5 C (97.7 F)   Resp: 18       Range:   BP: (112-145)/(51-86)   Temp:  [36.2 C (97.2 F)-36.8 C (98.2 F)]   Temp src:  [-]   Heart Rate:  [71-95]   Resp:  [15-18]   SpO2:  [100 %]      I/Os:  I/O last 3 completed shifts:  08/15 0700 - 08/16 0659  In: 1509 [P.O.:1500; I.V.:9]  Out: 1600 [Urine:800; Blood:800]  Net: -91       Physical Exam:  General: well appearing and in NAD  HEENT: Normocephalic, atraumatic  Cardiovascular: Regular rate and rhythm with no murmurs  Respiratory: CTAB  Abdomen: Soft, appropriately tender, nondistended, +BS  Neurological: Normal, average response (2+)  Extremities/Skin: Minimal edema of pedal and pretibial (1+)  Fundus:  Fundus firm at umbilicus  Incision: Clean, dry and intact with sterile dressing in place    Labs:    Recent Labs  Lab 10/28/12  0711 10/25/12  1457   Hematocrit 31* 37       A/P:  27 y.o. [id number] on POD# 1 s/p repeat cesarean section.  Doing well.  1) Continue routine postpartum care.   2) UOP adequate. D/c foley today.  3) Increase ambulation  4) Infant gender: female, desires circumcision  5) Feeding type: bottle  6) PPBC: desires patch. Had discussion with patient about LARC but patient is certain she wants patches. Will prescribe prior to discharge.  7) Rh: positive / Rubella: immune  8) Postpartum HCT/CBC: 31  9) Dispo: Plan for d/c home POD#3    Royal Hawthorn, MD  10/28/2012  11:07 AM      Ob/Gyn Attending Addendum:    I saw and evaluated the patient. I agree with the resident's findings and plan of care as documented above. This is a 27 yo [id number] who is postpartum day #1 s/p repeat LTCS. She is doing well  postoperatively. Bottle-feeding baby boy. Desires circ. PPBC: Declines LARC. Desires ortho evra patch. Discussed risks of venous thromboembolism in the postpartum period- can provide Rx for patch but patient should not start for 4-6 weeks. Recommended abstinence in the interim. Encouraged ambulation today.     Margart Sickles, MD

## 2012-10-28 NOTE — Anesthesia Post-procedure Eval (Signed)
Anesthesia Post-op Note    Patient: Jenna [name]    Procedure(s) Performed: CS- repeat    Anesthesia type: General    Patient location: Labor and Delivery    Mental Status: Recovered to baseline    Patient able to participate in this evaluation: yes  Last Vitals: BP: 114/62 mmHg (10/28/12 0735)  BP MAP : 74 mmHg (10/27/12 1730)  Heart Rate: 92 (10/28/12 0735)  Temp: 36.5 C (97.7 F) (10/28/12 0735)  Resp: 18 (10/28/12 0735)  SpO2: 100 % (10/27/12 1730)      Post-op vital signs noted above are within patient's normal range  Post-op vitals signs: stable  Respiratory function: baseline    Airway patent: Yes    Cardiovascular and hydration status stable: Yes    Post-Op pain: Adequate analgesia    Post-Op nausea and vomiting: none    Post-Op assessment: no apparent anesthetic complications, tolerated procedure well and no evidence of recall    Complications: none    Attending Attestation: All indicated post anesthesia care provided    Author: Allie Bossier, MD  as of: 10/28/2012  at: 11:02 AM

## 2012-10-29 MED ORDER — HYDROCODONE-ACETAMINOPHEN 10-325 MG PO TABS *I*
2.0000 | ORAL_TABLET | Freq: Four times a day (QID) | ORAL | Status: DC | PRN
Start: 2012-10-29 — End: 2012-10-29

## 2012-10-29 MED ORDER — HYDROCODONE-ACETAMINOPHEN 5-325 MG PO TABS *I*
1.0000 | ORAL_TABLET | Freq: Four times a day (QID) | ORAL | Status: DC | PRN
Start: 2012-10-29 — End: 2012-10-29

## 2012-10-29 MED ORDER — OXYCODONE HCL 5 MG PO TABS *I*
10.0000 mg | ORAL_TABLET | ORAL | Status: DC | PRN
Start: 2012-10-29 — End: 2012-10-30
  Administered 2012-10-29 – 2012-10-30 (×2): 10 mg via ORAL
  Filled 2012-10-29 (×2): qty 2

## 2012-10-29 MED ORDER — OXYCODONE HCL 5 MG PO TABS *I*
5.0000 mg | ORAL_TABLET | ORAL | Status: DC | PRN
Start: 2012-10-29 — End: 2012-10-29
  Administered 2012-10-29 (×2): 5 mg via ORAL
  Filled 2012-10-29 (×2): qty 1

## 2012-10-29 MED ORDER — HYDROCODONE-ACETAMINOPHEN 5-325 MG PO TABS *I*
1.0000 | ORAL_TABLET | ORAL | Status: DC | PRN
Start: 2012-10-29 — End: 2012-10-30
  Filled 2012-10-29: qty 2

## 2012-10-29 MED ORDER — DOCUSATE SODIUM 100 MG PO CAPS *I*
100.0000 mg | ORAL_CAPSULE | Freq: Two times a day (BID) | ORAL | Status: DC
Start: 2012-10-29 — End: 2012-10-30
  Administered 2012-10-29 – 2012-10-30 (×2): 100 mg via ORAL
  Filled 2012-10-29 (×2): qty 1

## 2012-10-29 MED ORDER — HYDROCODONE-ACETAMINOPHEN 10-325 MG PO TABS *I*
2.0000 | ORAL_TABLET | ORAL | Status: DC | PRN
Start: 2012-10-29 — End: 2012-10-30
  Administered 2012-10-29 – 2012-10-30 (×4): 2 via ORAL
  Filled 2012-10-29 (×4): qty 2

## 2012-10-29 MED ORDER — OXYCODONE HCL 5 MG PO TABS *I*
10.0000 mg | ORAL_TABLET | ORAL | Status: DC | PRN
Start: 2012-10-29 — End: 2012-10-29

## 2012-10-29 MED ORDER — SENNOSIDES 8.6 MG PO TABS *I*
2.0000 | ORAL_TABLET | Freq: Every day | ORAL | Status: DC
Start: 2012-10-29 — End: 2012-10-30
  Administered 2012-10-29 – 2012-10-30 (×2): 2 via ORAL
  Filled 2012-10-29 (×4): qty 2

## 2012-10-29 NOTE — Progress Notes (Signed)
OB Postpartum Progress Note:     Postpartum Day: 2    S:  Patient sleeping, not cooperative with questions when woken up. Pain control adequate. Eating, drinking, and voiding without difficulty. No further passage of clots or heavy bleeding - lochia now moderate and slowing down.    O:   Filed Vitals:    10/29/12 0747   BP: 113/66   Pulse: 85   Temp: 36.1 C (97 F)   Resp: 16       Range:   BP: (113-134)/(57-83)   Temp:  [35.5 C (95.9 F)-36.4 C (97.5 F)]   Temp src:  [-]   Heart Rate:  [78-100]   Resp:  [16-18]      I/Os:  I/O last 3 completed shifts:  08/16 0700 - 08/17 0659  In: 405.5 [P.O.:400; I.V.:5.5]  Out: 1150 [Urine:1150]  Net: -744.5       Physical Exam:  General: well appearing and in NAD  HEENT: Normocephalic, atraumatic  Cardiovascular: Regular rate and rhythm with no murmurs  Respiratory: Clear to auscultate  Abdomen: Soft, appropriately tender, nondistended, +BS  Extremities/Skin: Minimal edema of pedal and pretibial (1+)  Fundus:  Fundus firm at umbilicus -1  Incision: Clean, dry and intact    Labs:    Recent Labs  Lab 10/28/12  0711 10/25/12  1457   Hematocrit 31* 37       A/P:  27 y.o. [id number] on POD# 2 s/p repeat cesarean section.  Doing well.  1) Continue routine postpartum care  2) Increase ambulation  3) Infant gender: female s/p circumcision  4) Feeding type: bottle  5) PPBC: desires Ortho Evra patch  6) Rh: positive / Rubella: immune  7) Postpartum HCT/CBC: 31  8) Dispo: Plan for d/c home POD#3    Royal Hawthorn, MD  10/29/2012  7:53 AM

## 2012-10-29 NOTE — Progress Notes (Signed)
OB Postpartum Progress Note:   Postpartum Day: 2   S: Patient sleeping, not cooperative with questions when woken up. Pain control adequate. Eating, drinking, and voiding without difficulty. No further passage of clots or heavy bleeding - lochia now moderate and slowing down.   O:   Filed Vitals:     10/29/12 0747    BP:  113/66    Pulse:  85    Temp:  36.1 C (97 F)    Resp:  16      Range:   BP: (113-134)/(57-83)   Temp: [35.5 C (95.9 F)-36.4 C (97.5 F)]   Temp src: [-]   Heart Rate: [78-100]   Resp: [16-18]   I/Os: I/O last 3 completed shifts:   08/16 0700 - 08/17 0659   In: 405.5 [P.O.:400; I.V.:5.5]   Out: 1150 [Urine:1150]   Net: -744.5     Physical Exam:   General: well appearing and in NAD   HEENT: Normocephalic, atraumatic   Cardiovascular: Regular rate and rhythm with no murmurs   Respiratory: Clear to auscultate   Abdomen: Soft, appropriately tender, nondistended, +BS   Extremities/Skin: Minimal edema of pedal and pretibial (1+)   Fundus: Fundus firm at umbilicus -1   Incision: Clean, dry and intact   Labs:     Recent Labs   Lab  10/28/12  0711  10/25/12  1457    Hematocrit  31*  37      A/P: 27 y.o. [id number] on POD# 2 s/p repeat cesarean section. Doing well.   1) Continue routine postpartum care   2) Increase ambulation   3) Infant gender: female s/p circumcision   4) Feeding type: bottle   5) PPBC: desires Ortho Evra patch   6) Rh: positive / Rubella: immune   7) Postpartum HCT/CBC: 31   8) Dispo: Plan for d/c home POD#3   Royal Hawthorn, MD   10/29/2012   7:53 AM    MFM Attending    27 y.o. yo [id number] PPD#2, s/p Repeat Cesarean section.  Doing well. Routine care.  Anticipate discharge tomorrow.  Patient states Percocet causes drowsiness and is not effective for pain.  Will switch to another opioid.      I saw and evaluated the patient. I agree with the resident's/fellow's findings and plan of care as documented above.    Hildred Laser, MD

## 2012-10-29 NOTE — Progress Notes (Signed)
Approx 0745, nursing notified by PCT that PCT had been notified by dietary tech delivering breakfast that pt was sound asleep with infant in bed with her. Per report from prior shift, pt had been made aware that if this were found to be the case, infant would need to go to nursery. Upon entering room, nursing found pt to be awake eating breakfast with infant at her side. Nursing moved infant to crib, reinforced that infant could not sleep in bed with pt. Pt stated that she was not sleeping with infant in bed with her. Again, nursing reinforced with pt that infant needed to sleep in crib. Other newborn protection points also reinforced with pt, who vocalized understanding, though teaching likely needs further reinforcement. Will continue to monitor. Thomasenia Bottoms, RN

## 2012-10-30 MED ORDER — OXYCODONE-ACETAMINOPHEN 5-325 MG PO TABS *I*
1.0000 | ORAL_TABLET | ORAL | Status: DC | PRN
Start: 2012-10-30 — End: 2012-11-07

## 2012-10-30 MED ORDER — NORETHINDRONE  0.35 MG PO TABS *I*
1.0000 | ORAL_TABLET | Freq: Every day | ORAL | Status: DC
Start: 2012-10-30 — End: 2012-11-07

## 2012-10-30 MED ORDER — IBUPROFEN 600 MG PO TABS *I*
600.0000 mg | ORAL_TABLET | Freq: Three times a day (TID) | ORAL | Status: DC | PRN
Start: 2012-10-30 — End: 2012-11-07

## 2012-10-30 MED ORDER — SENNOSIDES 8.6 MG PO TABS *I*
2.0000 | ORAL_TABLET | Freq: Every day | ORAL | Status: DC
Start: 2012-10-30 — End: 2012-11-07

## 2012-10-30 MED FILL — Fentanyl Citrate Preservative Free (PF) Inj 100 MCG/2ML: INTRAMUSCULAR | Qty: 10 | Status: AC

## 2012-10-30 MED FILL — Ketamine HCl Inj 100 MG/ML: INTRAMUSCULAR | Qty: 5 | Status: AC
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
## 2012-10-30 NOTE — Discharge Summary (Signed)
Name: Jennaveve Salters MRN: [id number] DOB: 02-27-1986     Admit Date: 10/27/2012   Date of Discharge: 10/30/2012    Discharge Attending Physician: Tyrone Apple R      Hospitalization Summary    CONCISE NARRATIVE: Pt is a 27 y.o. [id number] admitted at [redacted]w[redacted]d for repeat LTCS for ho LTCSx2.  Pregnancy complicated by ho cocaine abuse negative 8/12, chronic hypertension on no medications, and BMI 49. Patient underwent uncomplicated repeat LTCS for history of two previous LTCS.  See operative report for details.  Postoperative course was uncomplicated.  Patient was discharged to home in good condition.       OTHER PROCEDURES/FINDINGS: Repeat LTCS  SIGNIFICANT MED CHANGES: Yes  Percocet, Colace, Ibuprofen      Signed: Royal Hawthorn, MD  On: 10/30/2012  at: 8:25 AM

## 2012-10-30 NOTE — Discharge Instructions (Signed)
Active Hospital Problems    Diagnosis   . H/O cesarean section      Resolved Hospital Problems    Diagnosis   No resolved problems to display.       Brief Summary of Your Hospital Course (including key procedures and diagnostic test results):  You underwent a repeat cesarean section for you beautiful baby!  Congratulations!      Written instructions provided: Welcome to [facility] your OB provider promptly if you experience any of the symptoms in the pre-written guidelines. If you cannot reach your MD/CNM, call their answering service or 9-1-1.    Diet: per pre-written guidelines.    Activity:  Per pre-written guidelines.      Brief Summary of Your Hospital Course (including key procedures and diagnostic test results):    You were admitted to [facility] for delivery You delivered a baby boy infant via c/s.  Congratulations !!  You had a routine postpartum course.      Written instructions provided: Welcome to [facility].        Diet: Regular diet.  After your Cesarean delivery you will usually be in the hospital for 3 days.  Your nurses and breastfeeding specialists will provide you with much of the support you need.      If you experience any of the following, please call your clinic to make a sooner appointment or speak with the doctor on call:    - Fever of 101 degrees or more, chills, sweats  - Pus draining from incision, or incision beginning to open up  - Severe pain  - Vaginal bleeding with large clots, a bad smell, or severe cramps  - Swelling, redness, or pain in your breast  - Burning when you pee, or peeing very often  - Calf pain or new leg swelling  - Dizziness with each time you stand  - Sadness, frequent crying      General/Pain:      For the first two weeks rest as often as you can.  Stay at home, nap when your baby sleeps, and avoid becoming over-tired.  Limit the number of visitors if you can.    Continue to eat healthy, drink  lots of fluids, take your prenatal vitamins and iron supplements.    Pain control after your Cesarean delivery is extremely important.  Please make sure to take your ibuprofen (Advil, Motrin) regularly and the narcotic pain medicine as needed (percocet, vicodin, oxycodone, hydrocodone).  These medicines are safe for breastfeeding.  The better your pain is controlled, the more you will be out of bed, taking deep breaths, and recovering.  Prolonged bed rest will put you at increased risk for pneumonia and blood clots.    Your skin clips (staples) are usually removed before you are discharged from the hospital.  If small paper tape (Steri-strips) were placed, then they can be removed after about two weeks, and this is most easily done in the shower.  Tenderness, itching, and numb spots around the incision are common for several months.    If you are experiencing a lot of gas pain, getting up and walking about frequently, eating, and using over-the-counter simethicone (Gas-X, Mylanta Gas) will help.  If you are constipated, make sure that you are drinking lots of fluids and use docusate sodium (colace), metamucil, or sennakot as needed.    Breast feeding:  Breast feeding is the best way to feed  your child.  It takes patience and determination.  Discuss this with your doctor, nurse, and breast feeding specialist in the hospital.  [facility] will give you an information sheet specifically on breast feeding.  Special nurse home visits and doctor office visits can be set up for you, once you go home.  Remember to stay well hydrated by drinking a large glass of water each time you breast feed.    If NOT Breast feeding:  To avoid discomfort; wear a snug support bra, apply ice packs to your breasts, and take ibuprofen.    Bleeding:  Vaginal bleeding after your delivery can be irregular, with spotting or small clots with increased activity and breastfeeding.  The blood can be different colors; red to pink to gray.  It  can last up to 6 weeks.  Your first period may return as soon as 4 weeks after delivery but can take months if you are breastfeeding.  Remember that you can still get pregnant before your period returns.  Have a birth control plan.    Bathing:  You will take your first shower in the hospital, 24 hours after your surgery - usually once the bandage is removed.  Showering daily is recommended to help keep the wound clean.  Simply let soapy water run over the incision and then pat (not rub) dry.  Wait on taking a bath or going in pool until 2 weeks after your delivery.    Lifting:  Avoid lifting objects heavier than your newborn for the first 2 weeks after your surgery.  After that, there are no specific limitations, as long as the lifting does not cause you pain.    Stairs:  Climbing stairs after your surgery will not cause your incision to open or separate and does not need to be restricted.  Climbing stairs though can be painful for some women and it is recommended that you use a handrail to avoid falls and take adequate pain medications.    Exercise:  Return to exercise is an individual decision.  It should start gradually and be appropriate for your level of pain and energy.  While it may be safe to start as early as 2 weeks after delivery, most women feel comfortable returning to some form of physical activity 4 to 6 weeks later.  Exercising will help you return to your weight before you were pregnant.    Driving:  Do not drive while you are still using narcotic pain medicines (percocet, vicodin, oxycodone, hydrocodone).  You may resume driving once you feel comfortable making an emergency stop.  While a hard break will not cause your incision to open, it can cause significant pain.  One suggestion is to get behind the wheel of a car when you feel ready.  For the first time, take a short trip only and have another adult join you in case you do not feel comfortable driving.    Sex:  After 2 weeks, you may resume  having sex once you and your partner feel ready to do so.  Routinely use vaginal lubricant (i.e. KY jelly), especially if breastfeeding as this can cause vaginal dryness.  A sexual position in which the woman is in control may help avoid discomfort.  Make sure to use a condom if you have not already started some sort of birth control.  It is possible for a woman to get pregnant before her 6 week visit, even before she starts to have her period again.  Return to Work or School:  When you decide to return to work or school is a Therapist, sports that depends on many things.  Some factors include whether you are breastfeeding, how you feel physically, your finances, and the amount of time you want to spend with your child.  You can return to work as soon as 6 weeks after delivery if you would like.  In the [place], 8 weeks is the standard time that employers give for a Cesarean delivery.  But some other countries allow for a lot more time than that.    Medical Equipment / Supplies: None    Community Services: None       Follow-Up:  Follow-up with your OB provider in six weeks for a postpartum check.  If you have incision staples in place at discharge, you will need to follow-up in the office in one week for removal.      Call your OB provider promptly if you experience any of the symptoms in the pre-written guidelines. If you cannot reach your MD/CNM, call their answering service or 9-1-1.    Medical Equipment / Supplies  None    [employer]  None

## 2012-10-31 LAB — CONFIRM OPIATES: Confirm Opiates: POSITIVE

## 2012-11-02 LAB — SURGICAL PATHOLOGY

## 2012-11-06 ENCOUNTER — Encounter: Payer: Self-pay | Admitting: Obstetrics & Gynecology

## 2012-11-07 ENCOUNTER — Ambulatory Visit: Payer: Self-pay | Admitting: Obstetrics & Gynecology

## 2012-11-07 VITALS — BP 116/86 | Ht 64.96 in | Wt 269.0 lb

## 2012-11-07 DIAGNOSIS — Z720 Tobacco use: Secondary | ICD-10-CM

## 2012-11-07 DIAGNOSIS — E669 Obesity, unspecified: Secondary | ICD-10-CM

## 2012-11-07 DIAGNOSIS — F32A Depression, unspecified: Secondary | ICD-10-CM

## 2012-11-07 DIAGNOSIS — Z98891 History of uterine scar from previous surgery: Secondary | ICD-10-CM

## 2012-11-07 MED ORDER — POLYETHYLENE GLYCOL 3350 PO POWD *I*
17.0000 g | Freq: Every day | ORAL | Status: DC
Start: 2012-11-07 — End: 2013-08-09

## 2012-11-07 MED ORDER — SERTRALINE HCL 50 MG PO TABS *I*
50.0000 mg | ORAL_TABLET | Freq: Every day | ORAL | Status: DC
Start: 2012-11-07 — End: 2014-01-10

## 2012-11-07 MED ORDER — IBUPROFEN 600 MG PO TABS *I*
600.0000 mg | ORAL_TABLET | Freq: Three times a day (TID) | ORAL | Status: AC | PRN
Start: 2012-11-07 — End: 2012-12-07

## 2012-11-07 MED ORDER — NORETHINDRONE  0.35 MG PO TABS *I*
1.0000 | ORAL_TABLET | Freq: Every day | ORAL | Status: DC
Start: 2012-11-07 — End: 2012-12-05

## 2012-11-07 MED ORDER — LORATADINE 10 MG PO TABS *I*
10.0000 mg | ORAL_TABLET | Freq: Every day | ORAL | Status: DC
Start: 2012-11-07 — End: 2014-06-19

## 2012-11-07 NOTE — Patient Instructions (Signed)
C-Section (Cesarean Delivery) Discharge Instructions    About this topic   Cesarean birth is a surgical procedure used to deliver a baby. It is also called a C-section. The baby is taken out through a cut in the mother's belly. A C-section may be planned or unplanned.     What care is needed at home?   · Ask your doctor what you need to do when you go home. Make sure you ask questions if you do not understand what the doctor says. This way you will know what you need to do.  · You can go home 3 to 4 days after the C-section. It may take 6 weeks before you fully recover.  · Talk to your doctor about how to care for your cut site. Ask your doctor about:  ¨ When you should change your bandages  ¨ How to care for your cut sites  ¨ When you may take a bath or shower  ¨ If you need to be careful with lifting things over the weight of your baby. Do not lift older children. Let the child climb into your lap.  ¨ When you may go back to your normal activities like work, driving, or sex  · Always wash your hands before and after touching your cut site.  · You can expect some bleeding from your vagina for a few weeks. You may use pads but not tampons.  · Your bowel movements may take some time to get back to a normal pace. Eat small meals high in fiber to avoid hard stools.  · Make time for you and your partner to be alone and talk.  · Make time for you and your partner to enjoy your baby.  · Breastfeeding is good for the baby and for you.  What follow-up care is needed?   · Your doctor may ask you to make visits to the office to check on your progress. Be sure to keep these visits.  · You may have stitches or staples at your cut site. Some stitches dissolve on their own. Others need to be taken out. Talk to your doctor to find out about your stitches or staples.  What drugs may be needed?   The doctor may order drugs to:  · Help with pain  · Fight an infection  Will physical activity be limited?   You may have to limit  your activity. Talk to your doctor about the right amount of activity for you.   What problems could happen?   · Infection  · Wound opening  · Heavy blood loss  · Blood clots in your legs or lungs  · Upset stomach, throwing up, and very bad headache because of the anesthesia  · Low mood  When do I need to call the doctor?   · Signs of infection. These include a fever of 100.4°F (38°C) or higher, chills, pain with passing urine, wound that will not heal.  · Sudden shortness of breath or a sudden onset of chest pain could be a sign that a blood clot has traveled to your lungs. Go to the ER right away.  · Signs of wound infection. These include swelling, redness, warmth around the wound; too much pain when touched; yellowish, greenish, or bloody discharge; foul smell coming from the cut site; cut site opens up.  · Problems with pain that does not go away or gets worse  · Swollen, hard, or painful breasts  · You feel very sad   or depressed  · Problems passing urine or with bowel movements  · Sudden, large amounts of vaginal bleeding  Helpful tips   · Use a small pillow to put pressure on your cut site. This can make you more comfortable when you cough, laugh, or do other actions.  · Have people help with house work, cooking, and watching the baby. This will let you get lots of rest.  Where can I learn more?   The American [employer] of Ob[employer]nec[employer]  http://www.acog.org/Resources_And_Publications/~/media/For%20Patients/faq006.ashx   Last Reviewed Date   2011-04-05  Disclaimer   This information is not specific medical advice and does not replace information you receive from your health care provider. This is only a brief summary of general information. It does NOT include all information about conditions, illnesses, injuries, tests, procedures, treatments, therapies, discharge instructions or life-style choices that may apply to you. You must talk with your health care provider for complete information  about your health and treatment options. This information should not be used to decide whether or not to accept your health care provider’s advice, instructions or recommendations. Only your health care provider has the knowledge and training to provide advice that is right for you.  Copyright   All content copyright © 1978-2013 [employer] or its respective owners. All Rights Reserved.
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
## 2012-12-03 ENCOUNTER — Telehealth: Payer: Self-pay | Admitting: Obstetrics & Gynecology

## 2012-12-03 NOTE — Telephone Encounter (Addendum)
27 y.o. [id number] s/p C-section on 8/15 called complaining of approximately two days of heavy vaginal bleeding. States that bleeding was lighter after c/s but never resolved. Currently, she is soaking through 5-6 pads in 20 minutes with bright red blood. Denies chest pain/SOB, N/V, fever, chills, sweats. Instructed patient to present ASAP to the emergency room for evaluation.  Pt agreed with plan.     - plan for TA/TV ultrasound to r/o retained POCs  - Misoprostil 800 mcg PR for vaginal bleeding  - GYN team aware    Mathews Robinsons, MD   OB/GYN R1  [phone]

## 2012-12-03 NOTE — Telephone Encounter (Signed)
Pt called asking if she could go to [facility] instead of [facility]. Cannot get to [facility] without ambulance. Told patient to present asap to [facility].   Pt in agreement.    Mathews Robinsons, MD   OB/GYN R1  [phone]

## 2012-12-04 ENCOUNTER — Observation Stay
Admit: 2012-12-04 | Payer: Self-pay | Source: Ambulatory Visit | Attending: Obstetrics and Gynecology | Admitting: Obstetrics and Gynecology

## 2012-12-05 ENCOUNTER — Ambulatory Visit: Payer: Self-pay | Admitting: Student in an Organized Health Care Education/Training Program

## 2012-12-05 ENCOUNTER — Encounter: Payer: Self-pay | Admitting: Student in an Organized Health Care Education/Training Program

## 2012-12-05 VITALS — BP 132/80 | Ht 64.0 in | Wt 274.0 lb

## 2012-12-05 DIAGNOSIS — Z720 Tobacco use: Secondary | ICD-10-CM

## 2012-12-05 DIAGNOSIS — F32A Depression, unspecified: Secondary | ICD-10-CM

## 2012-12-05 MED ORDER — NORELGESTROMIN-ETH ESTRADIOL 150-35 MCG/24HR TD PTWK *I*
1.0000 | MEDICATED_PATCH | TRANSDERMAL | Status: DC
Start: 2012-12-27 — End: 2013-08-09
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
## 2012-12-12 ENCOUNTER — Telehealth: Payer: Self-pay

## 2012-12-12 NOTE — Telephone Encounter (Signed)
DSS form completed and signed by Dr Fawn Kirk.  Given to D Fitts for faxing and scanning.

## 2012-12-13 NOTE — Telephone Encounter (Signed)
Papers have been faxed and awaiting scanning.

## 2012-12-22 ENCOUNTER — Encounter: Payer: Self-pay | Admitting: Obstetrics and Gynecology

## 2012-12-22 ENCOUNTER — Ambulatory Visit: Payer: Self-pay | Admitting: Obstetrics and Gynecology

## 2012-12-22 VITALS — BP 146/83 | Ht 64.0 in | Wt 275.0 lb

## 2012-12-22 DIAGNOSIS — N760 Acute vaginitis: Secondary | ICD-10-CM

## 2012-12-22 DIAGNOSIS — B9689 Other specified bacterial agents as the cause of diseases classified elsewhere: Secondary | ICD-10-CM

## 2012-12-22 DIAGNOSIS — N898 Other specified noninflammatory disorders of vagina: Secondary | ICD-10-CM

## 2012-12-22 LAB — POCT URINE PREGNANCY: Lot #: 700865

## 2012-12-22 MED ORDER — METRONIDAZOLE 500 MG PO TABS *I*
500.0000 mg | ORAL_TABLET | Freq: Two times a day (BID) | ORAL | Status: AC
Start: 2012-12-22 — End: 2012-12-29

## 2012-12-22 NOTE — Patient Instructions (Signed)
Bacterial Vaginosis    The Basics   Written by the doctors and editors at UpToDate   What is bacterial vaginosis? -- Bacterial vaginosis is an infection in the vagina that can cause bad-smelling vaginal discharge. “Vaginal discharge” is the term doctors and nurses use to describe any fluid that comes out of the vagina (figure 1). Normally, women have a small amount of vaginal discharge each day. But women with bacterial vaginosis can have a lot of vaginal discharge, or vaginal discharge that smells bad.  Bacterial vaginosis is caused by certain bacteria (germs). The vagina normally has different types of bacteria in it. When the amounts or the types of bacteria change, an infection can happen.   Women do not catch bacterial vaginosis from having sex. But women who have bacterial vaginosis have a higher chance of catching other infections from their partner during sex.  What are the symptoms of bacterial vaginosis? -- Most women with bacterial vaginosis have no symptoms. When women have symptoms, they often have a “fishy-smelling” vaginal discharge that they might notice more after sex. The discharge is watery and off-white or gray.   Some women can also have other symptoms that are not as common, but can include:  · Bleeding from the vagina after sex  · Itching on the outside of the vagina  · Pain when urinating or having sex  All of these symptoms can also be caused by other conditions. But if you have these symptoms, let your doctor or nurse know.  Is there a test for bacterial vaginosis? -- Yes. Your doctor or nurse will do an exam. He or she will also take a sample of your vaginal discharge, and do lab tests on the sample to look for an infection.  How is bacterial vaginosis treated? -- Bacterial vaginosis is treated with medicine. Two different medicines can be used. They are called:  · Metronidazole  · Clindamycin  Both of these medicines come in different forms. They can come as a pill or as a gel or  cream that a woman puts inside her vagina. Most women have fewer side effects when they use the gel or cream treatment. But you and your doctor or nurse will decide which medicine and which form is right for you.   It is important that you take all of the medicine your doctor or nurse prescribes, even if your symptoms go away after a few doses. Taking all of your medicine can help prevent the symptoms from coming back.  Does my sex partner need to be treated if I have bacterial vaginosis? -- No. Your sex partner does not need to be treated if you have bacterial vaginosis.  What happens if my symptoms come back? -- If your symptoms come back, let your doctor or nurse know. You might need treatment with more medicine.   Some women get bacterial vaginosis over and over again. These women might take medicine for 3 to 6 months to try to prevent future infections.  What if I am pregnant and have symptoms of bacterial vaginosis? -- If you are pregnant and have symptoms of bacterial vaginosis, tell your doctor or nurse. You might need treatment with medicine.   Can bacterial vaginosis be prevented? -- Sometimes. You can help prevent bacterial vaginosis by:  · Not douching (douching is when a woman puts a liquid inside her vagina to rinse it out)  · Not having a lot of sex partners  · Not smoking  All topics are updated as new evidence becomes available and our peer review process is complete.  This topic   retrieved from UpToDate on: Mar 24, 2011.  Topic 15592 Version 2.0  Release: 20.12 - C21.3  © 2013 UpToDate, Inc. All rights reserved.  figure 1: Adult female external genitalia     This drawing shows the parts of a woman's genitals.  Graphic 53704 Version 7.0  Disclaimer   The content on the UpToDate website is not intended nor recommended as a substitute for medical advice, diagnosis, or treatment. Always seek the advice of your own physician or other qualified health care professional regarding any medical questions or  conditions.The use of UpToDate content is governed by the UpToDate Terms of Use ©2013 UpToDate, Inc. All rights reserved.  Copyright   © 2013 UpToDate, Inc. All rights reserved.

## 2012-12-22 NOTE — Progress Notes (Signed)
SUBJECTIVE:   27 y.o. female complains of white, malodorous and milky vaginal discharge for 3 day(s).  Denies abnormal vaginal bleeding or significant pelvic pain or  fever. No UTI symptoms. Denies history of known exposure to STD.Would like to get checked as well.  Has not started contraception. Sterling Big is 30 weeks old. Not using condoms. Has RX for OCP    No LMP recorded.    OBJECTIVE:   She appears well, afebrile.  Abdomen: benign, soft, nontender, no masses.  Pelvic Exam: VULVA: normal appearing vulva with no masses, tenderness or lesions, VAGINA: vaginal discharge - white and malodorous, WET MOUNT done - results: KOH done, clue cells, vaginal pH is >4.5 + whiff, DNA probe for chlamydia and GC obtained, CERVIX: normal appearing cervix without discharge or lesions, UTERUS: uterus is normal size, shape, consistency and nontender.  Urine dipstick: not done.  UPT: negative    ASSESSMENT:   bacterial vaginosis    PLAN:   GC and chlamydia DNA  probe sent to lab.  Treatment: Flagyl 500 BID x 7 days, abstain from coitus during course of treatment and vaginitis precautions reviewed  ROV prn if symptoms persist or worsen.

## 2012-12-26 LAB — CHLAMYDIA PLASMID DNA AMPLIFICATION: Chlamydia Plasmid DNA Amplification: 0

## 2012-12-26 LAB — N. GONORRHOEAE DNA AMPLIFICATION: N. gonorrhoeae DNA Amplification: 0

## 2013-01-17 ENCOUNTER — Ambulatory Visit: Payer: Self-pay | Admitting: Obstetrics and Gynecology

## 2013-01-22 ENCOUNTER — Ambulatory Visit: Payer: Self-pay | Admitting: Student in an Organized Health Care Education/Training Program

## 2013-01-24 ENCOUNTER — Telehealth: Payer: Self-pay

## 2013-01-24 DIAGNOSIS — B9689 Other specified bacterial agents as the cause of diseases classified elsewhere: Secondary | ICD-10-CM

## 2013-01-24 DIAGNOSIS — N760 Acute vaginitis: Secondary | ICD-10-CM

## 2013-01-24 MED ORDER — METRONIDAZOLE 500 MG PO TABS *I*
500.0000 mg | ORAL_TABLET | Freq: Two times a day (BID) | ORAL | Status: AC
Start: 2013-01-24 — End: 2013-01-31

## 2013-01-24 NOTE — Telephone Encounter (Signed)
Sent rx to her pharmacy.

## 2013-01-24 NOTE — Telephone Encounter (Signed)
Patient was seen on 12/22/12 and treated for BV, patient went into Detox and did not complete medicine, now BV has come back    Would like rx sent to pharmacy

## 2013-01-24 NOTE — Telephone Encounter (Signed)
Called and spoke with patient, advised her rx sent to confirmed pharmacy.

## 2013-02-12 ENCOUNTER — Telehealth: Payer: Self-pay

## 2013-02-12 NOTE — Telephone Encounter (Signed)
Pt states she is having symptoms of recurrent BV. Also states she is having a flare up of hydranitis. States she never went for referral to dermatology. Cal Loturco [facility] scheduled 30 minute appt for patient to be evaluated on 02/15/13. Pt could not accept an appt before then. Will forward to Herbie Drape to asssit patient with the referral to dermatology.

## 2013-02-12 NOTE — Telephone Encounter (Signed)
Patient is calling to schedule an appointment. She has been experiencing vaginal discharge and vaginal odor. She also stated she had a mass in her underarm. Patient stated she tried to poke it with a needle but nothing came out. Please contact.

## 2013-02-14 NOTE — Telephone Encounter (Signed)
Sent request to have Dr.Nwabuobi enter new referral to Dermatology. Last referral closed due to lack of response from pt. (also referral was directed to wrong dept. General Surgery, when it should have been Dermatology. AK

## 2013-02-15 ENCOUNTER — Ambulatory Visit: Payer: Self-pay | Admitting: Obstetrics and Gynecology

## 2013-02-15 ENCOUNTER — Other Ambulatory Visit: Payer: Self-pay | Admitting: Student in an Organized Health Care Education/Training Program

## 2013-02-15 ENCOUNTER — Encounter: Payer: Self-pay | Admitting: Obstetrics and Gynecology

## 2013-02-15 VITALS — BP 130/83 | HR 71 | Ht 64.02 in | Wt 286.0 lb

## 2013-02-15 DIAGNOSIS — N760 Acute vaginitis: Secondary | ICD-10-CM

## 2013-02-15 DIAGNOSIS — L732 Hidradenitis suppurativa: Secondary | ICD-10-CM

## 2013-02-15 DIAGNOSIS — B9689 Other specified bacterial agents as the cause of diseases classified elsewhere: Secondary | ICD-10-CM

## 2013-02-15 MED ORDER — METRONIDAZOLE 500 MG PO TABS *I*
500.0000 mg | ORAL_TABLET | Freq: Two times a day (BID) | ORAL | Status: AC
Start: 2013-02-15 — End: 2013-02-22

## 2013-02-15 NOTE — Progress Notes (Signed)
Subjective:      Jenna [name] is a 27 y.o. female who presents for evaluation of yellowish-white vaginal discharge with odor for past 1-2 weeks.  Sexual history reviewed with the patient. STI Exposure: denies knowledge of risky exposure. Previous history of STI trichomonas. Current symptoms vaginal discharge: white, yellow and malodorous.  Contraception: OCP (estrogen/progesterone) intermittently.  Would like to have IUD placed.  Using Dial or Dove soap for bathing.  No douching, but does wash vagina with plain water.    Menstrual History:  OB History    Grav Para Term Preterm Abortions TAB SAB Ect Mult Living    6 5 5  1 1    4            Patient's last menstrual period was 01/26/2013.       Patient's medications, allergies, past medical, surgical, social and family histories were reviewed and updated as appropriate.    Review of Systems  Pertinent items are noted in HPI.      Objective:      BP 130/83  Pulse 71  Ht 1.626 m (5' 4.02")  Wt 129.729 kg (286 lb)  BMI 49.07 kg/m2  LMP 01/26/2013  Breastfeeding? No  General:   alert, appears stated age, morbidly obese and oriented x3   Lymph Nodes:   no inguinal adenopathy   Pelvis:  External genitalia: normal general appearance  Vaginal: normal rugae and discharge, small amount white adherent vaginal discharge  Cervix: normal appearance  Adnexa: non palpable  Uterus: normal single, nontender   Cultures:  none indicated     wet prep- + clue cells, + whiff, negative for yeast and trich  Assessment:       Bacterial vaginosis        Plan:      RX;  Metronidazole 500mg . BID x7d; no ETOH.  Recalls side effects from prior use  Reviewed vaginitis prevention measures  Safe sex practices reviewed  Enc. To continue on OCP until she makes an appointment for IUD insertion  RTC prn

## 2013-02-16 ENCOUNTER — Telehealth: Payer: Self-pay

## 2013-02-16 ENCOUNTER — Other Ambulatory Visit: Payer: Self-pay | Admitting: Obstetrics and Gynecology

## 2013-02-16 MED ORDER — ACETAMINOPHEN 325 MG PO TABS *I*
650.0000 mg | ORAL_TABLET | Freq: Four times a day (QID) | ORAL | Status: DC | PRN
Start: 2013-02-16 — End: 2013-03-13

## 2013-02-16 NOTE — Telephone Encounter (Signed)
Female from answering service called, stated pt called reporting boils painful under her arms. Not pregnant, phone # [phone].

## 2013-02-16 NOTE — Telephone Encounter (Signed)
Pt reports having ear pain, and inflammation of boils under her arms. Pt stated took her last tylenol for ear ache but has no more. Pt did not mention boil to provider yesterday, but has hx of hydranitis. The boil popped last night and draining now but more painful than it has been in past. Pt had a referral to dermatology for boil, pt expecting call from that office. Today pt asking if script for tylenol for pain and pt will pick up script for tylenol when she picks up her BV medication. Will check with provider to see if pt can get script for tyelonl. Pt did not want appointment, just tylenol to cover discomfort of 4/10 for armpit pain until she can see dermatology for hydranitis.

## 2013-02-16 NOTE — Telephone Encounter (Signed)
Spoke with pt and script for tylenol sent to pharmacy on file. Informed pt of this and verbalized would pick up today.

## 2013-02-22 NOTE — Telephone Encounter (Signed)
Duplicate message. 

## 2013-02-23 ENCOUNTER — Ambulatory Visit: Payer: Self-pay | Admitting: Dermatology

## 2013-02-23 ENCOUNTER — Encounter: Payer: Self-pay | Admitting: Dermatology

## 2013-02-23 VITALS — BP 118/72 | Ht 64.5 in | Wt 283.0 lb

## 2013-02-23 MED ORDER — DOXYCYCLINE HYCLATE 100 MG PO CAPS *I*
100.0000 mg | ORAL_CAPSULE | Freq: Two times a day (BID) | ORAL | Status: DC
Start: 2013-02-23 — End: 2013-08-09

## 2013-02-23 MED ORDER — CLINDAMYCIN PHOSPHATE 1 % EX LOTN *I*
TOPICAL_LOTION | Freq: Two times a day (BID) | CUTANEOUS | Status: DC
Start: 2013-02-23 — End: 2014-06-19

## 2013-02-23 NOTE — Progress Notes (Addendum)
Consulting MD: Provider, None    CC: sores in arm pits    HPI: Pt is a 27 y.o. black female who presents for the first time to dermatology clinic with the above complaints. There is no personal or family h/o skin cancer. Today, patient reports a > year history of recurrent tender, abscesses and sores in the arm pits that have frequent purulent drainage. She used to think it was a reaction to deodorant, but was more recently told it was hidradenitis. She tried clindamycin gel, but could not used it because it stung the open areas. She washes with Dial antibacterial soap. She has not had any other treatment. She denies involvement under breasts. Occasionally, she gets a single lesion over the groin, but denies scarring or drainage.    She reports having a new, tender red abscess in her right axillae today. It drains pus. She denies fevers and is otherwise in usual state of health.    She was told she might benefit from surgery and requests info re: where she would go to look into this.    ROS: Pt is otherwise in normal state of health    Allergies:  No Known Allergies (drug, envir, food or latex)    PMH:   Patient Active Problem List   Diagnosis Code   . Hidradenitis 705.83   . Tobacco abuse 305.1   . Obesity 278.00   . Cocaine abuse 305.63   . Depression 311   . H/O cesarean section V45.89   . S/P cesarean section V45.89       FH: No h/o skin cancer  Family History   Problem Relation Age of Onset   . Diabetes Paternal Grandfather    . Diabetes Paternal Grandmother    . Stroke Mother    . Hypertension Mother    . Cancer Mother      lesion on her lung   . Diabetes Sister    . Breast cancer Neg Hx    . Ovarian cancer Neg Hx    . Colon cancer Neg Hx        SocH: 3 cigarettes daily x 7 years; h/o cocaine use  History     Social History   . Marital Status: Single     Spouse Name: N/A     Number of Children: N/A   . Years of Education: N/A     Occupational History   . Not on file.     Social History Main Topics   . Smoking  status: Current Some Day Smoker -- 0.25 packs/day   . Smokeless tobacco: Never Used   . Alcohol Use: Yes      Comment: Drinks wine coolers irregularly.  Has not had ETOH for over 2 weeks.   . Drug Use: Yes     Special: Cocaine      Comment: last used 2 days ago.  Relapsed after 13 months   . Sexual Activity: Yes     Partners: Male     Birth Control/ Protection: OCP     Other Topics Concern   . Not on file     Social History Narrative   . No narrative on file         PE  Filed Vitals:    02/23/13 1508   BP: 118/72   Height: 1.638 m (5' 4.5")   Weight: 128.368 kg (283 lb)     General: Awake and alert, obese female in NAD  The following were examined  and WNL unless otherwise noted below:  Declines FSE below waist   -Face/Neck/Scalp:  -Chest/Abdomen/Back:  -BUE/hands:  1) Over the right axillae, there are multiple red, indurated plaques and draining sinus tracts over a background of pitted scarring; left axilla notable for sinus tracts and pitted scarring without any drainage  2) Inframammary fold is clear    Barriers to learning: None    Assessment/Plan:    1. Hidradenitis, axillae moderate severity on right  - Diagnosis and treatment options discussed  -Begin doxycycline 100 mg twice daily; Patient advised that doxycycline will make you more likely to get a sunburn; instructed to take the medication with food to prevent stomach upset and if she developes a severe headache that does not go away in 24 hours, she must stop the medication and call us  -Begin clindamycin lotion BID to AA over axillae  -Continue washing with antibacterial soap at least 3 times weekly  -Provided with contact information for Dr. Alvester Morin and [facility] for possible consult for surgery    RTC 6-8 weeks    Patient seen and examined with Dr. Barbaraann Rondo, MD  I saw and evaluated the patient. I agree with the resident's/fellow's findings and plan of care as documented above.    Oran Rein, MD

## 2013-02-23 NOTE — Patient Instructions (Signed)
Hidradenitis  - Take Doxycycline 100 mg twice daily with food. Doxycycline will make you more likely to get a sunburn. Please take the medication with food to prevent stomach upset. If you develop a severe headache that does not go away in 24 hours, you must stop the medication and call our office: [phone].   - Begin clindamycin lotion twice daily to affected areas  - Continue to wash with dial antibacterial soap 3 times weekly  - Consider seeing plastic surgery [phone] about surgical excision    Follow up in 6-8 weeks

## 2013-03-09 ENCOUNTER — Telehealth: Payer: Self-pay

## 2013-03-09 NOTE — Telephone Encounter (Signed)
LM on VM to call , needs appt.

## 2013-03-09 NOTE — Telephone Encounter (Signed)
Patient is on 2 antibiotics and she says every time she takes antibiotics she gets BV, She has vaginal discharge w/ odor with itching. She does not want to be seen, just wants an Rx sent to pharmacy.

## 2013-03-10 ENCOUNTER — Telehealth: Payer: Self-pay | Admitting: Obstetrics & Gynecology

## 2013-03-10 NOTE — Telephone Encounter (Signed)
27 y.o. [id number] called complaining of vaginal itching, saying it's yeast or BV. She is currently taking antibiotics and just finished a prescription for BV.   Instructed to use OTC yeast treatment and call clinic on Monday to be evaluated.   Mathews Robinsons, MD   OB/GYN R1  [phone]

## 2013-03-12 NOTE — Telephone Encounter (Signed)
Spoke with pt- advised appt needed for evaluation of her symptoms- voiced understanding- accepted appt for tomorrow at 1pm. Long Vulvovaginitis w/o lesions referenced

## 2013-03-13 ENCOUNTER — Ambulatory Visit: Payer: Self-pay | Admitting: Obstetrics and Gynecology

## 2013-03-13 ENCOUNTER — Encounter: Payer: Self-pay | Admitting: Obstetrics and Gynecology

## 2013-03-13 VITALS — BP 137/77 | HR 68 | Ht 64.0 in | Wt 295.6 lb

## 2013-03-13 DIAGNOSIS — N898 Other specified noninflammatory disorders of vagina: Secondary | ICD-10-CM

## 2013-03-13 DIAGNOSIS — R52 Pain, unspecified: Secondary | ICD-10-CM

## 2013-03-13 DIAGNOSIS — N912 Amenorrhea, unspecified: Secondary | ICD-10-CM

## 2013-03-13 DIAGNOSIS — R350 Frequency of micturition: Secondary | ICD-10-CM

## 2013-03-13 LAB — POCT URINE PREGNANCY: Lot #: 700547

## 2013-03-13 LAB — POCT URINALYSIS DIPSTICK
Glucose,UA POCT: NORMAL
Ketones,UA POCT: NEGATIVE
Leuk Esterase,UA POCT: 2 — AB
Lot #: 22916202
Nitrite,UA POCT: NEGATIVE
PH,UA POCT: 5 (ref 5–8)
Protein,UA POCT: NEGATIVE mg/dL
Specific gravity,UA POCT: 1.01 (ref 1.002–1.03)
Urobilinogen,UA: NORMAL

## 2013-03-13 MED ORDER — FLUCONAZOLE 150 MG PO TABS *I*
150.0000 mg | ORAL_TABLET | Freq: Once | ORAL | Status: AC
Start: 2013-03-13 — End: 2013-03-13

## 2013-03-13 MED ORDER — HYDROCODONE-ACETAMINOPHEN 5-300 MG PO TABS *I*
1.0000 | ORAL_TABLET | Freq: Four times a day (QID) | ORAL | Status: DC | PRN
Start: 2013-03-13 — End: 2013-08-09

## 2013-03-13 NOTE — Progress Notes (Signed)
Subjective:      Jenna [name] is a 27 y.o. female who presents for evaluation of vaginal discharge with itching for past week.  Also recently seen by dermatology for hidradenitis and started on doxycycline and clindamycin gel.  Has noticed more lesions recently and has had increased pain not managed with ibuprofen and is requesting pain medication. Sexual history reviewed with the patient. STI Exposure: denies knowledge of risky exposure. Previous history of STI trichomonas. Current symptoms vaginal discharge: white, vaginal irritation: mild.  Contraception: OCP (estrogen/progesterone).  Using antibacterial soap for bathing.  Washes vagina internally with plain water.  Menstrual History:  OB History    Grav Para Term Preterm Abortions TAB SAB Ect Mult Living    6 5 5  1 1    4            Patient's last menstrual period was 01/26/2013.       Patient's medications, allergies, past medical, surgical, social and family histories were reviewed and updated as appropriate.    Review of Systems  Pertinent items are noted in HPI.      Objective:      BP 137/77  Pulse 68  Ht 1.626 m (5\' 4" )  Wt 134.083 kg (295 lb 9.6 oz)  BMI 50.71 kg/m2  LMP 01/26/2013  General:   alert, appears stated age, morbidly obese and oriented x3   Lymph Nodes:   no inguinal adenopathy   Pelvis:  External genitalia: normal general appearance and lesions consistent with hidradenitis on mons pubis (open and draining)  Vaginal: normal rugae and discharge, small amount white discharge  Cervix: normal appearance  Adnexa: non palpable  Uterus: normal single, nontender and difficult to palpate d/t body habitus  Several open lesions in axillary area   Cultures:  none indicated     wet prep- negative for pathogens  Recent Results (from the past 24 hour(s))   POCT URINALYSIS DIPSTICK    Collection Time     03/13/13 12:35 PM       Result Value Range    Specific gravity,UA POCT 1.010  1.002 - 1.03    PH,UA POCT 5.0  5 - 8    Leuk Esterase,UA POCT +2 (*)  Negative    Nitrite,UA POCT Negative  Negative    Protein,UA POCT Negative  Negative mg/dL    Glucose,UA POCT Normal  Normal    Ketones,UA POCT Negative  Negative    Urobilinogen,UA Normal      Bilirubin,Ur +++ (*) Negative    Blood,UA POCT Trace (*) Negative    Exp date 01/2014      Lot # [id number]     POCT URINE PREGNANCY    Collection Time     03/13/13 12:37 PM       Result Value Range    Preg Test,UR POC    Negative-Dilute urine specimens may cause false negative urine pregnancy results...    Value: Negative-Dilute urine specimens may cause false negative urine pregnancy results...    INTERNAL CONTROL POCT URINE PREGNANCY *Yes-internal procedural control(s) acceptable      Exp date 11/06/13      Lot # [id number]       Assessment:       vaginitis      Pain related to multiple hidradenitis lesions  Plan:     Will give RX for diflucan for vaginitis, given recent initiation of doxycycline.   Reviewed vaginitis prevention measures  RX Hydrocodone 5-300 #20 1 po q6hr prn pain.  Discussed that further pain management issues should be addressed with dermatology.  Enc. To make sooner follow up appointment if this continues to be an issue.  Discussed taking OCP at same time every day and not missing pills.  On last week in pill pack so should be getting menses.  Call office if she does not get menstrual cycle.  RTC prn

## 2013-03-29 ENCOUNTER — Telehealth: Payer: Self-pay | Admitting: Dermatology

## 2013-03-29 NOTE — Telephone Encounter (Signed)
Medication not prescribed by Derm. Routed to prescribing provider Elton Sineborah Pittinaro NP

## 2013-03-29 NOTE — Telephone Encounter (Signed)
She should be seen by dermatology for follow up as I discussed this with her at her app't 12/30.  I gave her a limited number of vicodin, but told her if she continued to have problems she should be seen by derm sooner.  Not refilling RX today.  Thanks, Reece Levyeb

## 2013-03-29 NOTE — Telephone Encounter (Signed)
Patient calling to request refill of HYDROcodone-acetaminophen (VICODIN) 5-300 MG per tablet. Stated ibuprofen prescribed previously is not working. Please call patient if any additional questions, at [phone].

## 2013-03-29 NOTE — Telephone Encounter (Signed)
Call returned to pt.  Pt has already scheduled an appt in Dermatology,is not asking [facility] for more Vicodin.  [facility] describes as [facility] sx's.  Notes a white vaginal discharge with odor,states has h/o BV.  0 pain.  Pt requesting appt for Monday.  Appt has been scheduled for 04/02/13,Urgent,[facility].  Compliance reinforced,pt voiced understanding.  Referenced [facility],Vaginal Discharge.

## 2013-04-02 ENCOUNTER — Telehealth: Payer: Self-pay

## 2013-04-02 ENCOUNTER — Ambulatory Visit: Payer: Self-pay | Admitting: Reproductive Endocrinology and Infertility

## 2013-04-02 ENCOUNTER — Ambulatory Visit: Payer: Self-pay | Admitting: Dermatology

## 2013-04-02 NOTE — Telephone Encounter (Signed)
Jenna [name] called into confirm appt and realized she couldn't make today's, and February is too far away for her. She is in pain, and has lesions on her pelvic area and arms. Her OB prescribed her a low dose of Vicodin, and left it to her to get more pain meds from us. She will not wait until February, and stated she would like call back ASAP, please contact her back at [phone].

## 2013-04-02 NOTE — Telephone Encounter (Signed)
Patient feels hidradenitis is worsening, but she had to cancel appt today. She reports pain and malodorous drainage from groin area.    She will come in 1/23 at 1PM for evaluation (only time able).

## 2013-04-02 NOTE — Telephone Encounter (Signed)
Message sent to Dr Carpenter

## 2013-04-06 ENCOUNTER — Ambulatory Visit: Payer: Self-pay

## 2013-04-06 VITALS — BP 128/82 | Ht 64.0 in | Wt 270.0 lb

## 2013-04-06 DIAGNOSIS — L732 Hidradenitis suppurativa: Secondary | ICD-10-CM

## 2013-04-06 MED ORDER — PREDNISONE 10 MG PO TABS *I*
ORAL_TABLET | ORAL | Status: DC
Start: 2013-04-06 — End: 2013-08-09

## 2013-04-06 MED ORDER — SULFAMETHOXAZOLE-TMP DS 800-160 MG PO TABS *A*
1.0000 | ORAL_TABLET | Freq: Two times a day (BID) | ORAL | Status: DC
Start: 2013-04-06 — End: 2013-08-09

## 2013-04-06 NOTE — Progress Notes (Addendum)
CC: Follow-up hidradenitis suppurativa     HPI: Pt is a 28 y.o. black female who presents for follow-up to dermatology clinic with the above complaints.  She has an at least one year history of hidradenitis suppurativa. She was last seen 1 month ago, at which time she was started on doxycycline 100 mg twice a day and clindamycin lotion twice daily. Today she says that her hidradenitis is flaring and that she has not improved with the doxycycline and clindamycin. She has lesions in her right axilla and groin. She has also been taking bleach baths and has been putting hydrogen peroxide on the lesions.  She says that she is in excruciating pain. Tylenol and Ibuprofen are not relieving the pain. It is difficult for her to move around and lift things. She has a foul odor. She has been taking Vicodin as prescribed by PCP for her pain.     ROS: Pt is otherwise in normal state of health    PE  Filed Vitals:    04/06/13 1302   BP: 128/82   Height: 1.626 m (5\' 4" )   Weight: 122.471 kg (270 lb)     General: Awake and alert, obese female in NAD  The following were examined and WNL unless otherwise noted below:  -Face/Neck/Scalp:  -Chest/Abdomen/Back:  -BUE/hands:  -Groin:   1) Over the right axillae, there are multiple red, indurated plaques and draining sinus tracts over a background of pitted scarring; left axilla notable for sinus tracts and pitted scarring without any drainage  2) On the groin are multiple red indurated nodules. No drainage.     Barriers to learning: None    Assessment/Plan:  1. Hidradenitis, right axillae and axilla, moderate severity  - Diagnosis and treatment options discussed  -Stop Doxycycline. Switch to Bactrim DS BID for 1 month. Discussed risk of dangerous rash. Call if you develop any lesions inside the mouth or on the skin.   -Prescribed prednisone 40 mg for 5 days, followed by 30 mg for 5 days, followed by 20 mg for 5 days, followed by 10 mg for 5 days. Discussed that prednisone can cause her to  feel high energy and hungry.   -Continue clindamycin lotion BID to AA   -Continue bleach baths   -If no improvement with above, she should [facility]  -Discussed that we do not prescribe narcotics. She can take Tylenol or Ibuprofen for pain.     RTC 4 weeks     Beckey DowningElise Janicke, MD        I saw and evaluated the patient. I agree with the resident's/fellow's findings and plan of care as documented above.    Jaci LazierPATRICK J Sally Reimers, MD

## 2013-04-06 NOTE — Patient Instructions (Signed)
-  Stop Doxycycline. Switch to Bactrim DS BID for 1 month. Discussed risk of dangerous rash. Can if you develop any lesions inside the mouth or on the skin.   -Prescribed prednisone 40 mg for 5 days, followed by 30 mg for 5 days, followed by 20 mg for 5 days, followed by 10 mg for 5 days. Discussed that prednisone can cause her to feel high energy and hungry.   -Continue clindamycin lotion BID to AA   -Continue bleach baths

## 2013-04-16 ENCOUNTER — Telehealth: Payer: Self-pay

## 2013-04-16 NOTE — Telephone Encounter (Signed)
Pt is calling for culture results from 12/30. Please contact pt.

## 2013-04-16 NOTE — Telephone Encounter (Signed)
Spoke with Jenna [name].   Jenna [name] informed no vaginal cultures taken on 03/13/13.  Per Dr. Laqueta LindenLoder, no concerns with urine culture from 03/13/13.  Jenna [name] verbalized understanding.

## 2013-05-14 ENCOUNTER — Telehealth: Payer: Self-pay

## 2013-05-14 NOTE — Telephone Encounter (Signed)
Patient is having pain on her left side from her buttocks down her left leg.  Patient said that pain started 2 days ago.

## 2013-05-14 NOTE — Telephone Encounter (Signed)
No answer and no VM option.

## 2013-05-15 NOTE — Telephone Encounter (Signed)
Pt c/o left lower back pain that "shoots" down left leg.  Pain started 3 days ago.  Pt states she was sitting, went to stand up and fell.  Denies injury with fall. Pt has taken tylenol with mild effect. Reports taking tylenol every 4 hours.   Rates pain 9/10.  Pt states pain is unbearable. Pt reports some relief of pain in shower and when laying flat. Pt states pain is similar to epidural placement with last pregnancy in August of 2014.  Denies fever, numbness in toes, urinary or bowel complaints. Denies previous back injury.  Advised pt to call PCP regarding pain as it is likely not GYN related. Pt advised that if pain is unbearable she should be seen in ED.  Pt states her friend told her all they would do is give her muscle relaxers.  Pt advised that if medication made the pain tolerable would treatment be worthwhile? Pt states maybe she will go to the ED tomorrow. Pt provided with the number for IM and transferred to schedule new pt visit. Pt requests refill of tylenol. Will route to provider.   [facility].

## 2013-05-21 ENCOUNTER — Telehealth: Payer: Self-pay

## 2013-05-21 NOTE — Telephone Encounter (Signed)
Patient was given antibiotics last month for BV but wasn't able to finish them because her sister was taking them.  She is having symptoms again, fishy odor and some discharge and is requesting a script be called in to the pharmacy.

## 2013-05-22 ENCOUNTER — Telehealth: Payer: Self-pay

## 2013-05-22 NOTE — Telephone Encounter (Signed)
The patient was seen on 12/4 for BV not last month.  If she still is having concerns, she should schedule an appointment.  Thanks, Reece Levyeb

## 2013-05-22 NOTE — Telephone Encounter (Signed)
Error

## 2013-05-22 NOTE — Telephone Encounter (Signed)
Please see encounter yesterday, patient is requesting return call regarding symptoms.

## 2013-05-22 NOTE — Telephone Encounter (Signed)
Pt advised of reply per D Pittinaro- voiced understanding- will call back when she knows her schedule and will be able to come in to office

## 2013-05-31 ENCOUNTER — Ambulatory Visit: Payer: Self-pay | Admitting: Obstetrics and Gynecology

## 2013-05-31 ENCOUNTER — Telehealth: Payer: Self-pay

## 2013-05-31 NOTE — Telephone Encounter (Signed)
Spoke with resource nurse as patient has appointment with D Pittinaro at 4pm today. She will relay need for DSS paperwork completion to Pittinaro.

## 2013-05-31 NOTE — Telephone Encounter (Signed)
Regarding GYN patient with pending acute visit today:  [facility]  She is requesting to be called about DSS paperwork that she needs to clear her for work.  Patient informed that paperwork will take a week to 10 days and the provider she will be seeing is not listed as her CCP. She is requesting to speak with the nurse.

## 2013-06-27 ENCOUNTER — Ambulatory Visit: Payer: Self-pay | Admitting: Obstetrics and Gynecology

## 2013-07-09 ENCOUNTER — Ambulatory Visit
Admit: 2013-07-09 | Discharge: 2013-07-09 | Disposition: A | Discharge status: Home / Self Care | Payer: Self-pay | Source: Ambulatory Visit | Attending: Emergency Medicine | Admitting: Emergency Medicine

## 2013-07-09 ENCOUNTER — Telehealth: Payer: Self-pay

## 2013-07-09 MED ORDER — ACETAMINOPHEN 650 MG PO TABS
650.0000 mg | ORAL_TABLET | Freq: Four times a day (QID) | ORAL | Status: DC | PRN
Start: 2013-07-09 — End: 2013-08-09

## 2013-07-09 NOTE — Telephone Encounter (Signed)
Routed to Dr. Janicke

## 2013-07-09 NOTE — Telephone Encounter (Signed)
Prescription sent. Please let patient know.

## 2013-07-09 NOTE — Telephone Encounter (Signed)
Duwaine MaxinAshley Wohlford calling to request prescription(s) Tylenol  to be sent to [facility] - [place], [place] - [address]    Is patient out of the medication? yes  Does the patient have questions regarding the medication for the nurse? yes         Patient is having flare ups under her arms and would like the medication to help alleviate some of the pain. She can be reached at [phone]

## 2013-07-10 ENCOUNTER — Telehealth: Payer: Self-pay

## 2013-07-10 LAB — N. GONORRHOEAE DNA AMPLIFICATION: N. gonorrhoeae DNA Amplification: 0

## 2013-07-10 LAB — CHLAMYDIA PLASMID DNA AMPLIFICATION: Chlamydia Plasmid DNA Amplification: 0

## 2013-07-10 LAB — VAGINITIS SCREEN: DNA PROBE: Vaginitis Screen:DNA Probe: 0

## 2013-07-10 NOTE — Telephone Encounter (Signed)
Left message recommending use of Tylenol 650 mg every 6 hours alternating with Ibuprofen 600 mg every 6 hours. She should schedule a follow-up appointment to be seen if she is flaring.

## 2013-07-10 NOTE — Telephone Encounter (Signed)
Patient calling to speak with a doctor in regards to her arm pain. She states it is extremely painful and cant put her arm down. Morrie Sheldonshley asks that she please be contacted back at [phone] to advise. Thank you!!

## 2013-07-10 NOTE — Telephone Encounter (Signed)
Task to Dr.Janicke

## 2013-07-12 ENCOUNTER — Emergency Department
Admission: EM | Admit: 2013-07-12 | Disposition: A | Discharge status: Home / Self Care | Payer: Self-pay | Source: Ambulatory Visit | Attending: Emergency Medicine | Admitting: Emergency Medicine

## 2013-07-12 ENCOUNTER — Encounter: Payer: Self-pay | Admitting: Emergency Medicine

## 2013-07-12 LAB — HM HIV SCREENING OFFERED

## 2013-07-12 MED ORDER — SULFAMETHOXAZOLE-TRIMETHOPRIM 800-160 MG PO TABS *I*
2.0000 | ORAL_TABLET | Freq: Two times a day (BID) | ORAL | Status: DC
Start: 2013-07-12 — End: 2013-08-09

## 2013-07-12 MED ORDER — OXYCODONE-ACETAMINOPHEN 5-325 MG PO TABS *I*
1.0000 | ORAL_TABLET | Freq: Once | ORAL | Status: AC
Start: 2013-07-12 — End: 2013-07-12
  Administered 2013-07-12: 1 via ORAL
  Filled 2013-07-12: qty 1

## 2013-07-12 MED ORDER — SULFAMETHOXAZOLE-TMP DS 800-160 MG PO TABS *A*
2.0000 | ORAL_TABLET | Freq: Once | ORAL | Status: AC
Start: 2013-07-12 — End: 2013-07-12
  Administered 2013-07-12: 2 via ORAL
  Filled 2013-07-12: qty 2

## 2013-07-12 MED ORDER — OXYCODONE-ACETAMINOPHEN 5-325 MG PO TABS *I*
1.0000 | ORAL_TABLET | ORAL | Status: DC | PRN
Start: 2013-07-12 — End: 2013-08-09

## 2013-07-12 NOTE — ED Notes (Signed)
Left axillary pain, poked large cyst with needle no drainage.

## 2013-07-12 NOTE — Discharge Instructions (Signed)
Please return to the emergency department with any new or severe symptoms
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
## 2013-07-12 NOTE — ED Procedure Documentation (Signed)
Procedures   Incision and drainage  Date/Time: 07/12/2013 8:02 AM  Performed by: Evlyn KannerONNOR, Erikson Danzy P  Authorized by: Evlyn KannerONNOR, Olanda Boughner P  Consent: Verbal consent obtained.  Risks and benefits: risks, benefits and alternatives were discussed  Consent given by: patient  Required items: required blood products, implants, devices, and special equipment available  Patient identity confirmed: verbally with patient  Time out: Immediately prior to procedure a "time out" was called to verify the correct patient, procedure, equipment, support staff and site/side marked as required.  Type: abscess  Body area: upper extremity  Anesthesia: local infiltration  Patient sedated: no  Scalpel size: 11  Needle gauge: 20  Incision type: single straight  Drainage: purulent  Drainage amount: moderate  Wound treatment: wound left open  Patient tolerance: Patient tolerated the procedure well with no immediate complications        Evlyn Kannerimothy P Jashan Cotten, MD

## 2013-07-12 NOTE — ED Notes (Signed)
Pt with L axillary abscess, very tender, extends several inches long with fluctuance, and firm swelling about 2 inches wide max. Pt with pain radiating into shoulder and chest. Pt reports hx of same, taking prednisone and recently on bactrim, two small cysts Rt axilla without fluctuance. Pt states she was able to drain a minimal amt at home "but it just closed back up."

## 2013-07-13 ENCOUNTER — Telehealth: Payer: Self-pay

## 2013-07-13 NOTE — Telephone Encounter (Signed)
Left VM for patient, told her per supervisor that we cannot give her a referral to plastic surgery and I instructed her to call her primary care physician as they will be able to give her a referral.

## 2013-07-13 NOTE — Telephone Encounter (Addendum)
Patient was seen in the ED on 07/12/2013 for an abscess and was given a provider referral to Dr. Alvester MorinBell in Plastic Surgery. Ms. Jenna [name] was informed that an insurance referral is needed as well. Confirmed with Dr Shannan [facility]Bell's office [facility]. Please contact patient to advise at [phone] (H) Dr. Shannan HarperBell's office # [phone] Fax [phone]

## 2013-07-19 ENCOUNTER — Ambulatory Visit: Payer: Self-pay | Admitting: Dermatology

## 2013-07-19 ENCOUNTER — Telehealth: Payer: Self-pay

## 2013-07-19 NOTE — Telephone Encounter (Signed)
Patient calling for referral to Dr. Evern Bioerek Bell in Plastics. ED notes say to follow up with him and he will not see her without a referral. However, she said she needs glands removed and was seen in ED for cellulitis. See Daron Offerim O'Connor notes from ED. Not sure if the patient is being referred to incorrect department. Please advise.

## 2013-07-20 ENCOUNTER — Ambulatory Visit: Payer: Self-pay | Admitting: Dermatology

## 2013-07-25 ENCOUNTER — Telehealth: Payer: Self-pay

## 2013-07-25 NOTE — Telephone Encounter (Signed)
Patient is calling due to she has had vaginal itching for two days, was on antibiotics.

## 2013-07-25 NOTE — Telephone Encounter (Signed)
Attempted to reach pt, both numbers listed are not in operation.

## 2013-07-26 NOTE — Telephone Encounter (Signed)
File until patient returns call with good #.

## 2013-08-02 ENCOUNTER — Ambulatory Visit: Payer: Self-pay | Admitting: Dermatology

## 2013-08-09 ENCOUNTER — Emergency Department
Admission: EM | Admit: 2013-08-09 | Disposition: A | Discharge status: Home / Self Care | Payer: Self-pay | Source: Ambulatory Visit

## 2013-08-09 HISTORY — DX: Unspecified asthma, uncomplicated: J45.909

## 2013-08-09 LAB — ETHANOL: Ethanol: <10 mg/dL

## 2013-08-09 LAB — DRUG SCREEN CHEMICAL DEPENDENCY, URINE
Amphetamine,UR: NEGATIVE
Benzodiazepinen,UR: NEGATIVE
Cocaine/Metab,UR: POSITIVE
Opiates,UR: NEGATIVE
THC Metabolite,UR: NEGATIVE

## 2013-08-09 MED ORDER — HYDROXYZINE HCL 25 MG PO TABS *I*
25.0000 mg | ORAL_TABLET | Freq: Once | ORAL | Status: AC
Start: 2013-08-09 — End: 2013-08-09
  Administered 2013-08-09: 25 mg via ORAL
  Filled 2013-08-09: qty 1

## 2013-08-09 MED ORDER — NICOTINE POLACRILEX 2 MG MT GUM *I*
2.0000 mg | CHEWING_GUM | OROMUCOSAL | Status: DC | PRN
Start: 2013-08-09 — End: 2013-08-10
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
## 2013-08-09 NOTE — Discharge Instructions (Signed)
CPEP Discharge Instructions    Discharge Date: 08/09/2013    Discharge Time:   2320    Follow-ups: with current providers.       Level of outreach indicated if patient fails new intake or COPS (Comprehensive Outpatient Psychiatric Service) appointment: Routine Program Follow-up    When to call for help:    Call your psychiatric outpatient provider if experiencing any of these symptoms: increased irritability, sleep changes, appetite changes, energy changes, thoughts to harm yourself or others, anxiety, fear, auditory or visual hallucinations.  [facility] Helpline (24 hours/7 days) [phone] Consulting civil engineer)  Mobile Crisis team: [phone]    General Instructions:  Other written information given to the patient: No  Return to Work/School on: N/A      The above information has been discussed with me and I have received a copy.  I understand that I am advised to follow the instructions given to me to appropriately care for my condition.

## 2013-08-09 NOTE — ED Notes (Signed)
08/09/13 2311   Disposition details   Patient is being discharged to community   Family notification declined by patient   Outpatient provider notification of disposition other (comment)  (ROUTED)   Name of outpatient provider [facility]; [facility]UT[facility] DOYLE   Living situation: notification of disposition NA- lives alone   Transportation arranged via cab (voucher)   Patient belongings  given to family/friend   Medications NA- patient has none   Valuables NA- patient has none
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
## 2013-08-09 NOTE — ED Notes (Addendum)
Patient having suicidal thoughts since yesterday, planned to cut her wrists, MHA'd, +cocaine, couple of drinks hours ago.
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
## 2013-08-09 NOTE — ED Notes (Signed)
CPEP called and requesting BAL and urine tox.
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
## 2013-08-11 LAB — COCAINE, URINE, CONFIRMATION: Confirm COC/METAB: POSITIVE

## 2013-09-24 ENCOUNTER — Telehealth: Payer: Self-pay

## 2013-09-24 NOTE — Telephone Encounter (Signed)
Ms. Jenna [name] calling to report that she is experiencing fishy odor, discharge and itching. These symptoms have been going on for 1 week(s)    Patient requesting same day appointment? yes  Patient requesting call back? yes    Patient said she is taking cranberry probiotics off and on.

## 2013-09-24 NOTE — Telephone Encounter (Signed)
Problem as stated- pt offered and accepted appt  for tomorrow 07.14.2015 at 2:15 pm with D Pittinaro    Long Vulvovaginitis referenced    Will forward to OAS to schedule in [employer]

## 2013-09-24 NOTE — Telephone Encounter (Signed)
Please call and schedule her for medicaid clinic for this Thursday morning or Friday afternoon.

## 2013-09-24 NOTE — Telephone Encounter (Signed)
Patient states that she has hidradenitis on her armpits which has begun to flare up. However, the hidradenitis has also spread to her vaginal area. She states that she had to pop one of the boils there and she noticed a foul smelling discharge. Patient wanted to know what can be done as next available is not until Sept. Please call patient

## 2013-09-24 NOTE — Telephone Encounter (Signed)
Spoke with patient, she is scheduled on 7/17 in the PM

## 2013-09-24 NOTE — Telephone Encounter (Signed)
Tasked to Dr. Jamison OkaJanicke

## 2013-09-24 NOTE — Telephone Encounter (Signed)
Patient scheduled for 09/25/13 2:15 w/ D.Pittinaro.

## 2013-09-25 ENCOUNTER — Telehealth: Payer: Self-pay

## 2013-09-25 ENCOUNTER — Ambulatory Visit: Payer: Self-pay | Admitting: Obstetrics and Gynecology

## 2013-09-25 NOTE — Telephone Encounter (Signed)
Appointment noted.

## 2013-09-25 NOTE — Telephone Encounter (Signed)
Patient called back. Made an appt for 7.17.15 per patient request. She was offered a sooner appt.

## 2013-09-25 NOTE — Telephone Encounter (Signed)
Patient cancelled her 2:15 appointment today for vaginal discharge and itching and would like to reschedule, please contact.

## 2013-09-28 ENCOUNTER — Telehealth: Payer: Self-pay

## 2013-09-28 ENCOUNTER — Ambulatory Visit: Payer: Self-pay | Admitting: Obstetrics and Gynecology

## 2013-09-28 ENCOUNTER — Ambulatory Visit: Payer: Self-pay | Admitting: Dermatology

## 2013-09-28 NOTE — Telephone Encounter (Signed)
Patient cancelled her 3:15 appointment for today for vaginal itching and odor and would like to reschedule, please contact.

## 2013-09-28 NOTE — Telephone Encounter (Signed)
Pt with vaginal  itching, discharge, fishy odor, LMP 09/01/13. Denies pain, rescheduled to 10/01/13 Eliot FordBriggs: Vaginal discharge

## 2013-10-01 ENCOUNTER — Ambulatory Visit: Payer: Self-pay | Admitting: Obstetrics and Gynecology

## 2013-11-05 ENCOUNTER — Telehealth: Payer: Self-pay

## 2013-11-05 NOTE — Telephone Encounter (Signed)
Patient states she has vaginal discharge, vaginal odor, and abdominal pain with a pain level of 5/10. Please contact.

## 2013-11-05 NOTE — Telephone Encounter (Signed)
Spoke to patient who states she thinks she has had BV for many weeks and was unable to get to her last appointments. Patient has many no shows and verbalized these to Clinical research associate. Pt states she has vaginal itching, white thin discahrge, LMP 11/01/2013. Denies concern for STI's pain from 2-5/10 that comes and goes. Pt given appointment for tomorrow afternoon with CCP. Pt verbalized understanding as well as importance of coming to this appointment on time. Stated she would not miss this appointment as symptoms have been going on for too long. Reference: [facility]gs vaginal itching/irritation.

## 2013-11-06 ENCOUNTER — Ambulatory Visit: Payer: Self-pay | Admitting: Student in an Organized Health Care Education/Training Program

## 2013-11-06 ENCOUNTER — Encounter: Payer: Self-pay | Admitting: Student in an Organized Health Care Education/Training Program

## 2013-11-06 VITALS — BP 137/67 | HR 76 | Ht 65.0 in | Wt 298.1 lb

## 2013-11-06 DIAGNOSIS — N898 Other specified noninflammatory disorders of vagina: Secondary | ICD-10-CM

## 2013-11-06 LAB — VAGINITIS SCREEN: DNA PROBE: Vaginitis Screen:DNA Probe: POSITIVE

## 2013-11-06 MED ORDER — METRONIDAZOLE 500 MG PO TABS *I*
500.0000 mg | ORAL_TABLET | Freq: Two times a day (BID) | ORAL | Status: AC
Start: 2013-11-06 — End: 2013-11-13

## 2013-11-06 MED ORDER — OXYCODONE HCL 5 MG PO CAPS *A*
5.0000 mg | ORAL_CAPSULE | ORAL | Status: DC | PRN
Start: 2013-11-06 — End: 2014-01-10

## 2013-11-06 NOTE — Patient Instructions (Addendum)
 Metabolic Syndrome    The Basics   Written by the doctors and editors at UpToDate   What is metabolic syndrome?--Metabolic syndrome is a group of conditions that make a person more likely to get heart disease or type 2 diabetes.   To have metabolic syndrome, a person must have at least 3 of these 5 conditions:   ?Obesity with a large belly - Doctors use the term "obese" for people who have a "body mass index" or "BMI" of 30 or more. You can look up your BMI in this table (table 1). A large belly means a waist measurement greater than 40 inches for men or greater than 35 inches for women.   ?High blood pressure - Blood pressure measurements have 2 numbers. For instance, your doctor might say your blood pressure is "140 over 90." The top number is the pressure inside your arteries when your heart is contracting. The bottom number is the pressure inside your arteries when your heart is relaxed. You have high blood pressure if:  The top number is 130 or higher  The bottom number is 85 or higher  You take medicine for high blood pressure  ?High blood sugar - All the cells in your body need sugar to work normally. Sugar gets into the cells with the help of a hormone called insulin. If there is not enough insulin, or if the body stops responding to insulin, sugar builds up in the blood. You have high blood sugar if your blood sugar is greater than or equal to 100 mg/dL when tested after you have not had anything to eat or drink (except water) for 8 hours. This is known as a "fasting" blood sugar test.   ?High triglycerides - Triglycerides are fat-like substances in the blood. You have high triglycerides if your triglycerides are higher than 150 mg/dL.  ?Low HDL cholesterol - HDL is the "good cholesterol." That's because having high HDL levels lowers your risk of heart attacks and other health problems. You have a low HDL cholesterol if your HDL is less than 40 mg/dL if you're a man or less than 50 mg/dL if you're a  woman.  Some doctors don't think it is important use the term "metabolic syndrome" for this group of conditions. That's because the treatment of the metabolic syndrome is no different than the treatment of each of the conditions. So, giving a special name to the problem is not really needed. The best and safest treatment for these conditions is to lose weight by eating less and exercising more. Losing weight will help your waist size, your triglyceride levels, and your high blood pressure.  Are there tests for metabolic syndrome?--Yes. As part of an exam, a doctor or nurse will:  ?Take your blood pressure.  ?Measure your height and weight to calculate your BMI.  ?Measure the widest part of your belly with a tape measure. This measurement is called your "waist circumference" (figure 1).   You will also get blood tests to measure your blood sugar and blood lipids. "Lipids" is another word for fats. Blood lipids include triglycerides and cholesterol. People who have high triglycerides often have high cholesterol, too.   Can metabolic syndrome be prevented?--You can lower your chances of getting metabolic syndrome by:  ?Losing weight if you are overweight  ?Eating lots of fruits and vegetables and low-fat dairy products, but not a lot of meat or fatty foods  ?Walking or doing some form of physical activity on most days of  the week  ?Quitting smoking, if you smoke  How is metabolic syndrome treated?--Treatments include:  ?Diet - Healthy diets that can help you lose weight include:   The Mediterranean diet - This diet is high in fruits, vegetables, nuts, whole grains, and olive oil. It can help to lower weight, blood pressure, lipids, and improve blood sugar levels.  The DASH (Dietary Approaches to Stop Hypertension) diet - This diet is low in salt and fat. It includes 4 to 5 servings each of fruits and vegetables and 2 to 3 servings of low-fat dairy products per day. This diet can lower blood pressure, weight,  lipids, and blood sugar.  A high-fiber diet - Increasing dietary fiber (to at least 30 grams daily) can lower blood pressure and weight. Fiber is normally found in beans, grains, vegetables, and fruits (table 2). The nutrition label on packaged foods can show you how much fiber you are getting in each serving (figure 2).  ?Exercise - Doctors recommend that people exercise at least 30 minutes a day, on 5 or more days of the week. If you can't exercise for 30 minutes at a time, try to exercise for 10 minutes at a time, 3 or 4 times a day. Brisk walking is a good choice.   ?Medicines - Doctors often recommend medicines to lower blood pressure, blood lipids, and blood sugar.   All topics are updated as new evidence becomes available and our peer review process is complete.  This topic retrieved from UpToDate on: Jul 04, 2013.  Topic [phone] Version 3.0  Release: 23.3 - C23.74  2015UpToDate, Inc.All rights reserved.  table 1: How to find your body mass index (BMI)    Good weights  Overweight  Obesity    BMI, kg/m2  19 20 21 22 23 24 25 26 27 28 29 30  35 40   Height, inches  Weight, pounds    58" 91 96 100 105 110 115 119 124 129 134  138 143 167 191   59" 94 99 104 109 114 119 124 128 133 138  143 148 173 198   60" 97 102 107 112 118 123 128 133 138 143  148 153 179 204   61" 100 106 111 116 122 127 132 137 143 148  153 158 185 211   62" 104 109 115 120 126 131 136 142 147 153  158 164 191 218   63" 107 113 118 124 130 135 141 146 152 158  163 169 197 225   64" 110 116 122 128 134 140 145 151 157 163  168 174 204 232   65" 114 120 126 132 138 144 150 156 162 168  174 180 210 240   66" 118 124 130 136 142 148 155 161 167 173  179 186 216 247   67" 121 127 134 140 146 153 159 166 172 178  185 191 223 255   68" 125 131 138 144 151 158 164 171 177 184  190 197 230 262   69" 128 135 142 149 155 162 169 176 182 189  196 203 236 270   70" 132 139 146 153 160 167 174 181 188 195  202 209 243 278   71" 136 143 150 157 165 172 179 186 193  200 208 215 250 286   72" 140 147 154 162 169 177 184 191 199 206  213 221 258 294   73" 144 151 159 166 174 182 189 197 204 212  219 227 265 302  74" 148 155 163 171 179 186 194 202 210 218  225 233 272 311   75" 152 160 168 176 184 192 200 208 216 224  232 240 279 319   76" 156 164 172 180 189 197 205 213 221 230  238 246 287 328   Body Mass Index (BMI) is a measure of weight in relation to height. It is the most practical way to estimate if a person is underweight, at a healthy weight, overweight, or obese.  Graphic 16109 Version 1.0  figure 1: How to measure your waist circumference     To measure your waist circumference, place the measuring tape around your belly just above your hipbone. Make sure the measuring tape is snug but not too tight. Also, make sure the tape measure is parallel to the floor as it circles your body.  Graphic 60454 Version 1.0  table 2: Amount of fiber in different foods  Food  Serving  Grams of fiber    Fruits    Apple (with skin) 1 medium apple 4.4   Banana 1 medium banana 3.1   Oranges 1 orange 3.1   Prunes 1 cup, pitted 12.4   Juices    Apple, unsweetened, w/added ascorbic acid 1 cup 0.5   Grapefruit, white, canned, sweetened 1 cup 0.2   Grape, unsweetened, w/added ascorbic acid 1 cup 0.5   Orange 1 cup 0.7   Vegetables    Cooked    Green beans 1 cup 4.0   Carrots 1/2 cup sliced 2.3   Peas 1 cup 8.8   Potato (baked, with skin) 1 medium potato 3.8   Raw    Cucumber (with peel) 1 cucumber 1.5   Lettuce 1 cup shredded 0.5   Tomato 1 medium tomato 1.5   Spinach 1 cup 0.7   Legumes    Baked beans, canned, no salt added 1 cup 13.9   Kidney beans, canned 1 cup 13.6   Lima beans, canned 1 cup 11.6   Lentils, boiled 1 cup 15.6   Breads, pastas, flours    Bran muffins 1 medium muffin 5.2   Oatmeal, cooked 1 cup 4.0   White bread 1 slice 0.6   Whole-wheat bread 1 slice 1.9   Pasta and rice, cooked    Macaroni 1 cup 2.5   Rice, brown 1 cup 3.5   Rice, white 1 cup 0.6   Spaghetti (regular) 1 cup 2.5    Nuts    Almonds 1/2 cup 8.7   Peanuts 1/2 cup 7.9   To learn how much fiber and other nutrients are in different foods, visit the [place] [employer] manager) [employer]

## 2013-11-06 NOTE — Progress Notes (Signed)
R4 GYNECOLOGY CLINIC VISIT     CHIEF COMPLAINT: vaginal discharge    HISTORY OF PRESENT ILLNESS  Jenna [name] is a 28 y.o. [phone] female who presents to clinic today complaining of malodorous vaginal discharge. She is worried that her partner may be cheating on her and wants to be tested for different STDs.       PMH, PSH, SH, FH reviewed and updated as appropriate.    MEDICATIONS  Prior to Admission medications    Medication Sig Start Date End Date Taking? Authorizing Provider   metroNIDAZOLE (FLAGYL) 500 MG tablet Take 1 tablet (500 mg total) by mouth 2 times daily for 7 days Avoid alcohol. 11/06/13 11/13/13  Avel Peace, MD   oxyCODONE (OXY-IR) 5 MG immediate release capsule Take 1 capsule (5 mg total) by mouth every 4 hours as needed for Pain   Max daily dose: 30 mg 11/06/13   Drayce Tawil, MD   clindamycin (CLEOCIN T) 1 % lotion Apply topically 2 times daily 02/23/13   Cresenciano Genre, MD   VENTOLIN HFA 108 (90 BASE) MCG/ACT inhaler  09/19/12   [provider]   loratadine (CLARITIN) 10 MG tablet Take 1 tablet (10 mg total) by mouth daily 11/07/12   Zelphia Cairo, MD   sertraline (ZOLOFT) 50 MG tablet Take 1 tablet (50 mg total) by mouth daily 11/07/12   Zelphia Cairo, MD       ALLERGIES  No Known Allergies (drug, envir, food or latex)    REVIEW OF SYSTEMS  Gen: No fevers, chills, unintended weight loss  Resp: No cough, SOB  CV: No chest pain, no dyspnea  Abd: no abdominal pain, no nausea, no vomiting, no diarrhea, no constipation  GU: no urinary frequency, no urgency, no dysuria, no incontinence  MSK: no joint pains, no muscle aches      PHYSICAL EXAM  Filed Vitals:    11/06/13 1327   BP: 137/67   Pulse: 76   Height: 1.651 m ( )   Weight: 135.217 kg (298 lb 1.6 oz)       General - Pleasant, well-appearing female, in NAD  Resp - Normal respiratory effort  CV - Normal rate, normal rhythm  Abdomen - soft, nondistended, nontender  Pelvic - Normal-appearing external genitalia, vagina and cervix.   Anteverted uterus with smooth contour, no adnexal tenderness or fullness appreciated.  Physiologic vaginal discharge.  No CMT.  Extremities - No edema, no tenderness.    Cultures collected:  GC/CT/Vaginitis/Syphilis/HIV (verbal consent)  Wet prep: no pathogens, positive clue cells and positive whiff test    ASSESSMENT/PLAN  28 y.o. [id number] obese African American woman with BV infection and requesting r/o STIs.    1) GC/CT/Vaginitis/Syphilis/HIV (verbal consent) sent  2) Wet prep significant for BV, script for Flagyl sent to pt's pharmacy   3) Referral made to Nutrition counselor and [facility] to address her dieting habit and baseline depression      Return to clinic on 11/27/13  Discussed with Dr. Loyce Dys MD, MS   OB/GYN PGY4  Pager: [phone]

## 2013-11-07 LAB — CHLAMYDIA PLASMID DNA AMPLIFICATION: Chlamydia Plasmid DNA Amplification: 0

## 2013-11-07 LAB — N. GONORRHOEAE DNA AMPLIFICATION: N. gonorrhoeae DNA Amplification: 0

## 2013-11-13 ENCOUNTER — Telehealth: Payer: Self-pay
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
## 2013-11-13 NOTE — Telephone Encounter (Signed)
Call disconnected before being able to complete phone screen. Tried calling the patient back with no response.

## 2013-11-15 ENCOUNTER — Telehealth: Payer: Self-pay
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
## 2013-11-26 ENCOUNTER — Emergency Department
Admission: EM | Admit: 2013-11-26 | Disposition: A | Discharge status: Home / Self Care | Payer: Self-pay | Source: Ambulatory Visit

## 2013-11-26 MED ORDER — DIPHENHYDRAMINE HCL 25 MG PO TABS *I*
25.0000 mg | ORAL_TABLET | Freq: Once | ORAL | Status: AC
Start: 2013-11-26 — End: 2013-11-26
  Administered 2013-11-26: 25 mg via ORAL
  Filled 2013-11-26: qty 1

## 2013-11-26 MED ORDER — LOPERAMIDE HCL 2 MG PO CAPS *I*
2.0000 mg | ORAL_CAPSULE | Freq: Once | ORAL | Status: AC
Start: 2013-11-26 — End: 2013-11-26
  Administered 2013-11-26: 2 mg via ORAL
  Filled 2013-11-26: qty 1

## 2013-11-26 NOTE — Discharge Instructions (Signed)
 CPEP Discharge Instructions    Discharge Date: 11/26/2013    Discharge Time:   7:05pm    Follow-ups:  Mental Health    Instructions:  Please contact ONE of the providers listed below and inform them your child was discharged from CPEP and needs a COPS/Open Access Appointment to begin the intake process.  Please plan your time accordingly. Average intake time is 2 hours to complete.    Please bring your child and insurance information to the intake appointment.     For Child, Adolescents, and Adults   [facility]   [address] [place], [place] [address]   Phone: [phone]   WALK IN HOURS: Tuesdays, Wednesdays, and Thursdays from 9am - 11am     [facility]   [address]   [place], [place] [address]   Phone: [phone]   WALK IN HOURS: Monday - Thursday 9am - 3pm and Friday 9am - 12pm    For Adults Only  [facility]  [address]  [place], [place] [address]  Phone:  [phone]  WALK IN HOURS:  Monday - Friday 9am - [facility]  [address]  [place], [place] [address]  Phone:  [phone]  WALK IN HOURS: Wednesday and Thursday mornings 8:30am - 9:30am    Chemical Dependency Resources     How can I tell if I have an alcohol problem?    Alcohol may be harmful for you if it causes a problem in any part of your life. Following are signs that you may have a drinking problem or are alcohol dependent.      Blacking out or forgetting where you were or what you were doing    Drinking to get drunk. Or, feeling like you need to drink more to get the same feeling or "buzz"    Drinking to decrease pain or stress   Drinking more than you had expected to drink    Drinking in a pattern. For example, every day or every week at the same time    Suffering from the effects of alcohol on your daily function    Drinking starts to take over and causes problems in your daily life, such as not  showing up for work or driving when you are drunk    Trying to hide how much you drink   Feeling guilty or angry when someone says something about your drinking    Seeing or hearing things that are not there (hallucinating)    Having major personality changes when you drink    Planning activities around drinking    Experiencing seizures (convulsions)    Shaking of your hands if you have not had a drink for a while    Sleeping problems or bad dreams    Sweating, nervousness, confusion, or depression   Thinking a lot about drinking    Trouble having erections     What's the harm?    Not all drinking is harmful. You may have heard that regular light to moderate drinking  (from  drink a day up to 1 drink a day for women and 2 for men) can even be good  for the heart. With at-risk or heavy drinking, however, any potential benefits are  outweighed by greater risks.    Injuries. Drinking too much increases your chances of being  injured or even killed.  Alcohol is a factor, for example, in about 60% of fatal burn injuries, drownings, and  homicides; 50% of severe trauma injuries and sexual assaults; and 40% of fatal  motor vehicle crashes, suicides, and fatal falls.    Health problems. Heavy drinkers have a greater risk of liver disease, heart disease,  sleep disorders, depression, stroke, bleeding from the stomach, sexually transmitted  infections from unsafe sex, and several types of cancer. They may also have problems  managing diabetes, high blood pressure, and other conditions.    Birth defects. Drinking during pregnancy can cause brain damage and other serious  problems in the baby. Because it is not yet known whether any amount of alcohol is  safe for a developing baby, women who are pregnant or may become pregnant should  not drink.    Alcohol use disorders. Generally known as alcoholism and alcohol abuse, alcohol  use disorders are medical conditions that doctors can diagnose when a patient's  drinking  causes distress or harm. In the [place], about 18 million people have  an alcohol use disorder.       Wellness Hints:     Be honest and open about your alcohol use with your family, close friends, and health care professionals. Ask for and accept their help.   Avoid persons who use and/or abuse alcohol and who try to get you to drink alcohol.   Get the help of a counselor and keep regular appointments.   Eat a normal, well-balanced diet, drink six to eight glasses of water a day, and get plenty of rest.   Replace social activities associated with drinking alcohol with those that do not involve alcohol.   Consider cutting down on the amount of alcohol you drink and how often you drink it.    Resources for Drug and/or Alcohol Treatment   [email or web address]                  DETOX FACILITIES:    Engineering geologist Addiction Services Economist)  [address] [place] [address] (6th floor) [place] [address] [phone]     [facility]   [address] [place] [address] [phone]     [facility] (VA or Straight Medicaid only)  [address] [place] [address] [phone]     [facility], Alcohol Only)  [address] [place] [address] [phone]   [facility] - Outpatient Only, Alcohol Only)  [address] [place] [address] [phone]   [facility]  [address] [place] [address] [phone]       INPATIENT PROGRAMS:    [facility]   [address] [place] [address] [address] [facility] [address]) [phone]   Katherine Roan  [address] [place] [address] [phone] option 3     [facility]  [address] [place] [address]  (Facility is affiliated with [employer]. Referral must be sent to [facility] who will then forward information to this location.) [phone]     [facility])  [address]  [place] [address] [phone]     [facility]  [address] [place] [address] [phone]     [facility]-   [address] [place] [address]  Facility is affiliated with [facility]. Referral must be  sent to [facility] who will then forward information to this location.) [phone]     [facility]   [address] [place] [address] (located on [facility]) [phone]      [facility] [place] [facility])  [address] [place] [address] [phone]      Holiday representative ()   [address] [place] [address] [phone] ext [phone]           OUTPATIENT PROGRAMS:    [facility] - Comprehensive Alcoholism  [address] [place] [address] [phone]   [facility] (Restart)  [address] [place] [address] [phone]     [facility]patient Chemical Dependency  [address] [place] [address] [address] [facility] [address])  [address] [place] [address] [phone]  [phone]     [facility] (Monday- Thursday)  [address] [place] [address] [phone] ext [phone]  [address] [place] [address]  [address] [place] [address] (Spanish speaking available)  [phone] [place] [phone]  (indicate location when calling main intake line)   Melissa Noon (Hispanic Outreach) - [facility] Restart  [address] [place] [address] [phone]     [facility])  [address] [place] [address] [phone]     [facility] (@ [facility])  [address] [place] [address] [phone]     [facility]  [address] [place] [address] [phone]     [facility]   [address] [place] [address] [place] [address]   [address] [place] [address]    [address] [place] [address] Jenelle Mages)  [phone]  (indicate location when calling main intake line)    [facility]  [address] [place] [address] [phone]         ADDITIONAL RESOURCES:    Al-Anon/Al-Teen [phone]   Alcohol Abuse & Addiction Information [phone]   Alcohol Anon

## 2013-11-26 NOTE — ED Notes (Signed)
Pt remains present in the milieu, acting appropriately with staff and other patients.  Expressing no concerns at this time. Will continue to monitor Q15 minute safety checks and comfort measures maintained.

## 2013-11-26 NOTE — ED Notes (Signed)
11/26/13 0715   Admission   CPEP Legal Status on Arrival Voluntary   *Mode of Arrival Ambulatory   Admitting Procedure   *Belonging Search Yes   *Patient Checked for Contraband Belongings Checked;Body Scanned with Wand;Clothing Checked   *Oriented to Unit Yes   15 Minute Checks Maintained Yes   Belongings   Clothing With Patient;Clothing Room/Cupboard  (shoes)   Other Valuables Other (Comment)  (bag of clothes, wig, unpoped popcorn)

## 2013-11-26 NOTE — ED Notes (Signed)
11/26/13 1904   Disposition details   Family notification not done-unable to reach  (phone for pt's boyfriend was disconnected when writer tried calling)   Outpatient provider notification of disposition patient has no outpatient provider   Living situation: notification of disposition not done-unable to reach   Transportation arranged via cab (voucher)   Patient belongings  given to patient
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
## 2013-11-26 NOTE — ED Notes (Signed)
Depressed this AM, has not been taking her meds. Denies SI/HI

## 2013-11-26 NOTE — ED Notes (Signed)
11/26/13 1904   Disposition details   Family notification not done-unable to reach  (phone for pt's boyfriend was disconnected when Clinical research associate tried calling)   Outpatient provider notification of disposition patient has no outpatient provider   Living situation: notification of disposition not done-unable to reach   Transportation arranged via cab (voucher)   Patient belongings  given to patient
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
## 2013-11-26 NOTE — ED Notes (Signed)
Pt has been cooperative in the milieu and engaging with other pts. Beyond bed bug bites, pt voices no further concerns or needs at this time. Will continue to monitor. Safety checks maintained.

## 2013-12-03 ENCOUNTER — Telehealth: Payer: Self-pay

## 2013-12-03 NOTE — Telephone Encounter (Signed)
Sharah needs to schedule her appointment and can be reached at [phone].

## 2013-12-04 NOTE — Telephone Encounter (Signed)
Writer confirmed 10/14 1PM appointment with Dr. Jimmy Picket

## 2013-12-05 ENCOUNTER — Telehealth: Payer: Self-pay

## 2013-12-05 NOTE — Telephone Encounter (Signed)
Patient called requesting a "letter about her appointment information on 10.14.15" to be mailed to her home. Writer confirmed address on file. If there are any problems, please contact patient.

## 2013-12-18 ENCOUNTER — Telehealth: Payer: Self-pay

## 2013-12-18 NOTE — Telephone Encounter (Signed)
Pt states she did a self breast exam yesterday.  Found a quarter sized lump on her right breast. States the lump is itchy but denies redness, pain or discharge from nipple.  Pt thinks her LMP was about 12/02/13.  Pt does not use any hormonal birthcontrol. Pt anxious on phone asking if she has breast cancer.  Reviewed with pt that she will need to be seen in office.  Reviewed breast tissue changes with the hormones of the menstrual cycle. Offered pt sooner appt.  Pt declined as she does not have a ride.  Will attempt to find ride then return call to office.   [facility] [place] Breast Lump

## 2013-12-18 NOTE — Telephone Encounter (Signed)
Patient c/o lump in breast without pain. Patient scheduled for 12/28/13 with a NP. Patient is worried and will like to speak to a nurse.

## 2013-12-19 NOTE — Telephone Encounter (Signed)
Pt has pending appt on 12/28/2013,for breast concern.  Left message for pt,if able to secure a ride sooner than next week to call for scheduling.

## 2013-12-24 ENCOUNTER — Telehealth: Payer: Self-pay | Admitting: Psychiatry

## 2013-12-24 NOTE — Telephone Encounter (Signed)
Writer called pt to see if she had any questions about transportation for 10/14 appointment. Pt was instructed to call insurance company for bus passes to come to appointment. Pt was receptive and planned on calling them and plans to be at appointment on Wednesday 10/14.

## 2013-12-26 ENCOUNTER — Encounter: Payer: Self-pay | Admitting: Psychiatry

## 2013-12-26 ENCOUNTER — Telehealth: Payer: Self-pay | Admitting: Psychiatry

## 2013-12-26 NOTE — Telephone Encounter (Signed)
[facility] MISSED/CANCELLED APPOINTMENT     Name: Jenna MaxinSHLEY Perfect  MRN: [id number]   DOB: 12/11/1985    Date of Scheduled Service: 12/26/13    Ms. Katrinka BlazingSmith was a no show for today's appointment.  I have spoken with the patient by phone.  The patient stated the reason for today's absence is that she forgot..    Additional Information:    Not applicable

## 2013-12-28 ENCOUNTER — Ambulatory Visit: Payer: Self-pay | Admitting: Obstetrics and Gynecology

## 2014-01-03 ENCOUNTER — Ambulatory Visit: Payer: Self-pay | Admitting: Nephrology

## 2014-01-03 ENCOUNTER — Ambulatory Visit: Payer: Self-pay

## 2014-01-08 ENCOUNTER — Telehealth: Payer: Self-pay

## 2014-01-08 NOTE — Telephone Encounter (Signed)
Letter of 12-05-13 regarding scheduled appointment(refer to call of 12-05-13) has been returned for bad address.

## 2014-01-10 ENCOUNTER — Ambulatory Visit: Payer: Self-pay | Admitting: Internal Medicine

## 2014-01-10 ENCOUNTER — Encounter: Payer: Self-pay | Admitting: Internal Medicine

## 2014-01-10 ENCOUNTER — Ambulatory Visit: Payer: Self-pay

## 2014-01-10 VITALS — BP 132/90 | HR 88 | Temp 95.2°F | Ht 62.0 in | Wt 300.0 lb

## 2014-01-10 DIAGNOSIS — Z0000 Encounter for general adult medical examination without abnormal findings: Secondary | ICD-10-CM

## 2014-01-10 DIAGNOSIS — F32A Depression, unspecified: Secondary | ICD-10-CM

## 2014-01-10 MED ORDER — SERTRALINE HCL 50 MG PO TABS *I*
50.0000 mg | ORAL_TABLET | Freq: Every day | ORAL | Status: DC
Start: 2014-01-10 — End: 2014-06-19

## 2014-01-10 NOTE — Progress Notes (Signed)
Ambulatory Risk Screen    Psychological Risks:  None at this time    Controlled Substance Profile: none at this time    Advanced Directive: paperwork given to pt     Nutrition: No problems    Functional/Cognitive Ability:  Pt ambulates independently     Pt oriented to clinic and written information given to her

## 2014-01-10 NOTE — Progress Notes (Addendum)
[facility] Progress Note    Reason For Visit:   Chief Complaint   Patient presents with    New Patient Visit       Subjective:      Jenna [name] is 28 y.o. year old female with PMHx of depression, smoker and hx of cocaine abuse (under remission)  here for new patient visit.     HPI: Has multiple complains today.   1. C/o of being depressed, teared up. Previously on trazodone, Seroquel which she stopped taking years ago. Was on Zoloft for few months and felt better, therefore self discontinued it almost a year ago. Was seen by psychiatrists.  Since then she has been depressed. Appetite has increased, notes wt. Gain, sleep is ok. Boyfriend c/o of snoring very loud, patient caught her self gasping for air. sometimes has day time fatigue.  No SI/HI.   2. Also has SOB when climbing stairs which she attributes to her weight. Smokes 2 cigerets a day,  Use albuterol 2 twice a day. No hx of asthma. Also has cough, no sputum. No running nose, no congestions.   3. Lump on right breast: noticed  3weeks ago, non tender, no nipple discharge , no family hx of breast cancer,no wt. Loss, has noticed size decreased , last felt it one week ago. Had regular periods, LMP: 3 weeks ago which correlates with the mass.     denied any alcohol. Used to do cocaine a year ago, was in rehab for six months. She was sanctioned and currently going through the process. Denied any recent drug use.     Social Hx: Lives with his 37month old son Sharia Reeve(Josh) and father of the baby Linden Dolin(Qareem). Has two more kids who visit on weekend. Currently unemployed. Feels safe at home. No abuse. Sexually active. Does not use protection.     Lump on right breast: noticed  3weeks ago, non tender, no nipple discharge , no family hx of breast cancer    Past Medical Hx: depression, cocaine abuse, tobacco abuse  Past surgical Hx: C section.   Family Hx: lung cancer in family   Medications:     Current Outpatient Prescriptions on File Prior to Visit   Medication Sig  Dispense Refill    oxyCODONE (OXY-IR) 5 MG immediate release capsule Take 1 capsule (5 mg total) by mouth every 4 hours as needed for Pain   Max daily dose: 30 mg 15 capsule 0    clindamycin (CLEOCIN T) 1 % lotion Apply topically 2 times daily 60 mL 3    VENTOLIN HFA 108 (90 BASE) MCG/ACT inhaler       loratadine (CLARITIN) 10 MG tablet Take 1 tablet (10 mg total) by mouth daily 30 tablet 12    sertraline (ZOLOFT) 50 MG tablet Take 1 tablet (50 mg total) by mouth daily 30 tablet 6     No current facility-administered medications on file prior to visit.       Medications reviewed and reconciled.   Allergies:   No Known Allergies (drug, envir, food or latex)    Review of Systems:     Pertinent positives and negatives as per HPI.     Physical Exam:     Filed Vitals:    01/10/14 1527   BP: 132/90   Pulse: 88   Temp: 35.1 C (95.2 F)   TempSrc: Temporal   Height: 1.575 m (5\' 2" )   Weight: 136.079 kg (300 lb)     Wt Readings from  Last 3 Encounters:   01/10/14 136.079 kg (300 lb)   11/26/13 145.151 kg (320 lb)   11/06/13 135.217 kg (298 lb 1.6 oz)     BP Readings from Last 3 Encounters:   01/10/14 132/90   11/26/13 128/60   11/06/13 137/67       General: In no apparent distress, tearful  See VS.    HEENT: Eyes- PERRL. Throat- No exudates.  Neck:  No LAD.  Pulmonary: CTA B/L. No wheezes, rhonchi, or rales.  Cardiovascular: No JVD, No carotid bruits. RRR. No murmurs, rubs, or gallops. No pedal edema.  Abdominal: Soft. Non-distended. Non-tender. Bowel sounds present.  Skin: No wounds on feet.  Breast exam: no masses palpated, no axillary, cervical, clavicular lymphadenopathy    Assessment and Plan:   Jenna [name] is 28 y.o. year old female with PMHx of depression, Obesity, smoker and hx of cocaine abuse (under remission)  here for new patient visit. Has multiple complains today, including depression, breast lump and SOB.     Plan:  1. Depression: previously seen by Psychiatrists, self discontinued Zoloft almost a year  ago. No SI/HI.   - restart Zoloft 50mg  daily   - referral to Psychiatrist.    - check TSH    2. Night time snoring/fatigue: Possible OSA, patient morbidly obese  - referral to sleep medicine    3. R. Breast mass per patient: not palpated during exam, patient noted decrease in size last week, correlates with menstruation based on hx, therefore likely benign  - no red flags such has family hx, wt. Loss or nipple discharge  - continue to monitor    4. Hx of cocaine abuse:   - 6 months rehab, clean for 1 year  - discussed substance abuse prevention    5. Smoker: smokes 2 cigarettes a day  - patient ready to quit  - resources provided    6. Obesity:   - referral to dietician    [facility] Smoking Cessation Plan    Discussed smoking cessation with patient. Patient readiness to quit: Yes  Discussed/counseled with patient smoking cessation plan according to USPSTF guidelines: I advised patient to quit, and offered support.    Health Maintenance:  - Pap Smear - uptodate  - Immunizations - enrolled in Flu vaccine study? Per patient, cannot get vaccine today  - Hgb A1c - check today  - baseline CMP/CBC    Follow up in one month for depression    Tobey Grimhwani Patel, MD 01/10/2014 3:33 PM    Case discussed with the resident at the time of the visit. I agree with the findings and plan of care as documented by the resident regarding depression for which we will restart sertraline and refer to psychiatry for consideration of other medications. Breast mass may have been a cyst. She will be followed closely for this.      Shary DecampMarc Cap Massi, M.D.10/31/2015at6:14 AM

## 2014-01-10 NOTE — Patient Instructions (Signed)
Take zoloft 50mg  daily    Refer to dietician, attempt to loose weight  Stop smoking  refer to sleep medicine  Refer to psychiatrist    Follow up in 1 month.

## 2014-01-28 ENCOUNTER — Telehealth: Payer: Self-pay | Admitting: Primary Care

## 2014-01-28 ENCOUNTER — Telehealth: Payer: Self-pay

## 2014-01-28 NOTE — Telephone Encounter (Signed)
Call returned to pt.  Calling with c/o white vaginal discharge with a fishy odor.  0 pain.No dysuria.  Per pt's availability appt has been scheduled for 01/30/14,LRM.  Compliance reinforced,pt voiced understanding.  Referenced [facility],Vaginal Discharge.

## 2014-01-28 NOTE — Telephone Encounter (Signed)
Received call from the pt with concerns about Bacterial Vaginosis.  Pt is asking for treatment.  Discussed calling GYN- Pt states she was told to contact [facility].  Call[facility] and [facility] with Nursing at [name].  Pt was called and instructed to call for an appt or treatment form GYN  The pt agreed and verbalized understanding.

## 2014-01-28 NOTE — Telephone Encounter (Signed)
Vaginitis    Are you pregnant:no    When did the symptoms start: 2 days ago.    Are you having the following symptoms:    Vaginal discharge: yes   Foul smelling odor: yes Fishy   Vaginal itching/irriation: no   Burning with urination: no  Patient states that she was treated for BV but did not finish medication and problem has reaccured.  She is requesting a RX sent to her pharmacy.  Patient last seen [facility] 8-25-+15.  She has been informed that  this appointment was  some time ago and she may need to be seen.

## 2014-01-28 NOTE — Telephone Encounter (Signed)
Patient calling to advise she has Bacterial vaginosis and is requesting medication. Please call patient back at [phone] and states if that one doesn't work try [phone].

## 2014-01-30 ENCOUNTER — Ambulatory Visit: Payer: Self-pay | Admitting: Obstetrics and Gynecology

## 2014-02-11 ENCOUNTER — Ambulatory Visit: Payer: Self-pay | Admitting: Internal Medicine

## 2014-02-21 ENCOUNTER — Telehealth: Payer: Self-pay | Admitting: Internal Medicine

## 2014-02-21 NOTE — Telephone Encounter (Signed)
Line busy

## 2014-02-21 NOTE — Telephone Encounter (Signed)
Error

## 2014-02-21 NOTE — Telephone Encounter (Signed)
Spoke with pt- has had these symptoms for a month- looking to schedule appt for next week. Referencing Long Vulvovaginitis w/o lesions- pt was advised evaluation recommended within 72 hours- provided with locations of urgent care centers for urgent evaluation- pt voiced understanding

## 2014-02-21 NOTE — Telephone Encounter (Signed)
Started transportation form and placed it in Dr. Carlyon Prowsoran folder for review in clinic this afternoon  Will contact patient tomorrow if the form is completed

## 2014-02-21 NOTE — Telephone Encounter (Signed)
Patient missed the appointment that was scheduled for 11/18.  She is still having symptoms and would like an appointment next week on either Monday or Thursday.

## 2014-02-21 NOTE — Telephone Encounter (Signed)
Patient calling stating she started a group on T/W/F and needs medical transportation to take her to these group meetings. Patient stated that she called her insurance to get a cab and they told her she needed a referral from her PCP office to get the transportation services provided to her. Patient stated that she suffers from obesity, acute stress disorder, and anxiety so she is not able to take the bus to her groups. Patient stated the [employer] office can be reached @ [phone] to advise, but she does not have a fax number. She does need a letter or referral sent to them so that she can start receiving transportation.    Patient can be reached @ [phone], but she stated the phone number may be disconnected, and if so to call [phone].

## 2014-02-22 NOTE — Telephone Encounter (Signed)
Left vm for patient and advised her that we did fax over transportation form so she should be all set and if she has any further   Questions to contact us back.    If patient calls back please advise her of this message

## 2014-02-22 NOTE — Telephone Encounter (Signed)
Faxed over transportation form

## 2014-03-05 ENCOUNTER — Ambulatory Visit: Payer: Self-pay | Admitting: Internal Medicine

## 2014-03-20 ENCOUNTER — Telehealth: Payer: Self-pay | Admitting: Internal Medicine

## 2014-03-20 NOTE — Telephone Encounter (Signed)
Patient is calling with a severe cough, sore throat, and a lot of phlegm. Patient states that her chest and body hurts.    Patient can be reached at [phone].

## 2014-03-20 NOTE — Telephone Encounter (Signed)
Pt cc/o sore throat x 2 days with 8/10 pain.   Reports it's hard to swallow at times.   Reports trouble breathing when coughing.   Reports feeling achy and sweaty.   Reports Musinex not working. Reports she took 2 tabs amoxacillin from friend.   Pt requesting appt. Appt scheduled 03/21/14 with Dr. Doretha ImusHannon.   Pt agreed with plan of care.

## 2014-03-21 ENCOUNTER — Ambulatory Visit: Payer: Self-pay | Admitting: Internal Medicine

## 2014-03-28 ENCOUNTER — Ambulatory Visit: Payer: Self-pay | Admitting: Obstetrics and Gynecology

## 2014-04-24 ENCOUNTER — Telehealth: Payer: Self-pay

## 2014-04-24 NOTE — Telephone Encounter (Signed)
No answer no machine

## 2014-04-29 ENCOUNTER — Ambulatory Visit: Payer: Self-pay | Admitting: Sleep Medicine

## 2014-05-01 ENCOUNTER — Telehealth: Payer: Self-pay | Admitting: Internal Medicine

## 2014-05-01 NOTE — Telephone Encounter (Signed)
Call to Ms. Napoli to give appointment scheduled for a Sleep Medicine Appt as requested at last office visit.     Ms. Ranck is scheduled on May 24th at 8:00 am.     This test is located at [address].     Should Ms. Smallwood need to reschedule, she can call [phone].    Call outcome: mailed letter

## 2014-05-20 ENCOUNTER — Telehealth: Payer: Self-pay

## 2014-05-20 NOTE — Telephone Encounter (Signed)
Pt states a few weeks ago she went [facility] and was given medication for vaginal infection, she was unable to finish.Sx now are external itching and fishy odor, self treated for yeast a few days ago with external cream. Appt given for 05/21/14 in am. Eliot FordBriggs: Vaginal discharge

## 2014-05-20 NOTE — Telephone Encounter (Signed)
Vaginitis    Are you pregnant:no    When did the symptoms start: 7 days ago.    Are you having the following symptoms:    Vaginal discharge: yes   Foul smelling odor: yes   Vaginal itching/irriation: yes   Burning with urination: no

## 2014-05-21 ENCOUNTER — Ambulatory Visit: Payer: Self-pay | Admitting: Reproductive Endocrinology and Infertility

## 2014-05-21 ENCOUNTER — Encounter: Payer: Self-pay | Admitting: Reproductive Endocrinology and Infertility

## 2014-05-21 VITALS — BP 139/70 | HR 79 | Ht 65.0 in | Wt 302.0 lb

## 2014-05-21 DIAGNOSIS — N760 Acute vaginitis: Secondary | ICD-10-CM

## 2014-05-21 DIAGNOSIS — B9689 Other specified bacterial agents as the cause of diseases classified elsewhere: Secondary | ICD-10-CM

## 2014-05-21 LAB — POCT VAGINAL WET MOUNT
CLUE CELLS,POC: POSITIVE
KOH FOR FUNGUS: NEGATIVE
TRICHOMONAS, POC: NEGATIVE
WHIFF TEST: POSITIVE
YEAST,POC: NEGATIVE

## 2014-05-21 MED ORDER — METRONIDAZOLE 500 MG PO TABS *I*
500.0000 mg | ORAL_TABLET | Freq: Two times a day (BID) | ORAL | Status: AC
Start: 2014-05-21 — End: 2014-05-28

## 2014-05-21 NOTE — Progress Notes (Signed)
Jenna [name] is a 29 y.o. female who presents for evaluation of an abnormal vaginal discharge. Symptoms have been present for 1months. Vaginal symptoms: vaginal symptoms of itching and perineal odor and vaginal discharge white and thin.  She denies burning, painful perineum and swollen perineum.  She denies changes to soaps, detergents or hygiene products.Sexually transmitted infection risk: possible STD exposure. Patient's last menstrual period was 05/04/2014 (exact date). Contraception: none.    Jenna [name] has Hidradenitis; Tobacco abuse; Obesity; Cocaine abuse; Depression; H/O cesarean section; and S/P cesarean section on her problem list.  Jenna [name] has a current medication list which includes the following prescription(s): sertraline, clindamycin, ventolin hfa, and loratadine.    Review of Systems  GYN ROS:  --GU Sxs:            --Vag DC: Yes            --Dysuria: No            --Bleeding with sex: No  --Breast Sxs: None  --Endocrine Sxs.: None     Objective:     Filed Vitals:    05/21/14 0831   BP: 139/70   Pulse: 79   Height: 1.651 m (5\' 5" )   Weight: 136.986 kg (302 lb)       General appearance: 29 y.o. female. alert, cooperative and no distress  PELVIC:   Ext Gen: Normal female  Vagina: pink, moderate amount of thin white adherent discharge  Cervix: no discharge, no CMT  Uterus: NSSC, NT  Adnexa: no palpable masses, non-tender    Recent Results (from the past 24 hour(s))   POCT vaginal wet mount    Collection Time: 05/21/14  9:02 AM   Result Value Ref Range    TRICHOMONAS, POC Negative-none seen     YEAST,POC Negative-none seen     YEAST COMMENT,POC      CLUE CELLS,POC Positive moderate     LEUKOCYTES,POC      KOH FOR FUNGUS Negative-none seen     KOH FOR FUNGUS COMMENT      WHIFF TEST Positive       Assessment:   Bacterial vaginosis    PLAN:   BV: Cervical DNA amplification for GC and chlamydia were collected.  Will follow up with patient for any abnormal results.  She was given a prescription for Metronidazole  500 mg 1 tab PO BID x 7 days.  ETOH precautions reviewed; perineal hygiene and vaginitis prevention discussed.    Return to the office for unresolved or worsening symptoms.  Patient verbalized understanding of the treatment plan today and all questions were answered to her satisfaction.       Marguerita MerlesKELLY I Gertude Benito, NP

## 2014-05-22 LAB — N. GONORRHOEAE DNA AMPLIFICATION: N. gonorrhoeae DNA Amplification: 0

## 2014-05-22 LAB — CHLAMYDIA PLASMID DNA AMPLIFICATION: Chlamydia Plasmid DNA Amplification: 0

## 2014-05-24 ENCOUNTER — Telehealth: Payer: Self-pay | Admitting: Internal Medicine

## 2014-05-24 NOTE — Telephone Encounter (Signed)
Patient called requesting to speak with a nurse  Patient states she has been experiencing severe abdominal pain, unable to digest food (fills like it just sits there), vomiting, and having sulfur like burps  Patient states that her stomach tends to hurt more after she eats  Patient is unsure as to what is causing the pain    Patient is scheduled for 03/16 with Langley Gauss, NP per patient request  Writer advised patient that if unable to attend the appointment to reach out to [name] to cancel   Patient expressed understanding     Any questions please call [phone]

## 2014-05-24 NOTE — Telephone Encounter (Signed)
Spoke to patient who states that she has been feeling bloated and full lately after eating.  She states that she develops "sour burps" after eating and has thrown up due to feeling full.  She states that she has not thrown up recently.  Patient states that symptoms have been going on for a month.  She states that she has been having regular BM's but that they are loose.  Patient states that she notices symptoms in particular after eating "heavy" meals.  Patient denies abdominal cramping and fever.  Patient has appointment for 05/29/14.  Encouraged patient to drink plenty of fluids, to eat light meals and to go to ED if symptoms worsen before appointment.  Patient verbalized understanding.

## 2014-05-29 ENCOUNTER — Ambulatory Visit: Payer: Self-pay | Admitting: Internal Medicine

## 2014-06-04 ENCOUNTER — Telehealth: Payer: Self-pay | Admitting: Internal Medicine

## 2014-06-04 NOTE — Telephone Encounter (Signed)
Jenna [name] is returniDuwaine [name] Jenna [name] Jenna [name]'s call.  Please call Jenna [name] back at [phone].

## 2014-06-04 NOTE — Telephone Encounter (Signed)
returned call to pt, no answer, message left will try again later

## 2014-06-04 NOTE — Telephone Encounter (Signed)
Returned call to pt  She c/o harsh cough, ear aches , body aches   She denies fever, chills  She thinks she might be pregnant; period is 4 days late   Pt advised to drink plenty of fluids, rest, and keep appt for tomorrow with Karen Kitchensenise Bilsback, NP  Pt will call the office with any change or worsening sx

## 2014-06-04 NOTE — Telephone Encounter (Signed)
Patient returning call regarding [facility]. Patient stated she forgot to mention her ears and throat itch and ears keep "popping". Patient would like a call back at [phone]

## 2014-06-04 NOTE — Telephone Encounter (Signed)
Ms. Pancoast calling to report that she is experiencing cough, nose bleed, stuffy nose, chest pain when coughing, and body aches. These symptoms have been going on for 2 day(s).    She is wondering if Nurse can call in script to [facility] on [address].    Patient requesting same day appointment? no  Patient requesting call back? yes    Phone number confirmed at [phone]

## 2014-06-05 ENCOUNTER — Ambulatory Visit: Payer: Self-pay | Admitting: Internal Medicine

## 2014-06-15 ENCOUNTER — Encounter: Payer: Self-pay | Admitting: Gastroenterology

## 2014-06-17 ENCOUNTER — Telehealth: Payer: Self-pay | Admitting: Internal Medicine

## 2014-06-17 NOTE — Telephone Encounter (Signed)
Ms. Dedeaux calling to report that she is experiencing extreme leg pain as well as ankle. These symptoms have been going on for 3 day(s)    Patient requesting same day appointment? no but will if needed.  Patient requesting call back? yes    Phone number confirmed at [phone]
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
## 2014-06-19 ENCOUNTER — Other Ambulatory Visit: Payer: Self-pay | Admitting: Gastroenterology

## 2014-06-19 ENCOUNTER — Inpatient Hospital Stay
Admission: EM | Admit: 2014-06-19 | Disposition: A | Discharge status: Home Health | Payer: Self-pay | Source: Ambulatory Visit | Attending: Orthopedic Surgery | Admitting: Orthopedic Surgery

## 2014-06-19 ENCOUNTER — Telehealth: Payer: Self-pay | Admitting: Internal Medicine

## 2014-06-19 ENCOUNTER — Encounter: Payer: Self-pay | Admitting: Palliative Care

## 2014-06-19 ENCOUNTER — Ambulatory Visit: Payer: Self-pay | Admitting: Primary Care

## 2014-06-19 VITALS — BP 110/88 | HR 80 | Temp 97.5°F | Ht 65.0 in | Wt 303.0 lb

## 2014-06-19 DIAGNOSIS — S82891A Other fracture of right lower leg, initial encounter for closed fracture: Secondary | ICD-10-CM

## 2014-06-19 DIAGNOSIS — S82831A Other fracture of upper and lower end of right fibula, initial encounter for closed fracture: Principal | ICD-10-CM

## 2014-06-19 HISTORY — DX: Body Mass Index (BMI) 40.0 and over, adult: Z684

## 2014-06-19 HISTORY — DX: Cocaine abuse, uncomplicated: F14.10

## 2014-06-19 HISTORY — DX: Tobacco use: Z72.0

## 2014-06-19 HISTORY — DX: Morbid (severe) obesity due to excess calories: E66.01

## 2014-06-19 LAB — CBC AND DIFFERENTIAL
Baso # K/uL: 0 10*3/uL (ref 0.0–0.1)
Basophil %: 0.4 %
Eos # K/uL: 0.2 10*3/uL (ref 0.0–0.4)
Eosinophil %: 2.1 %
Hematocrit: 26 % — ABNORMAL LOW (ref 34–45)
Hemoglobin: 7.5 g/dL — ABNORMAL LOW (ref 11.2–15.7)
IMM Granulocytes #: 0 10*3/uL (ref 0.0–0.1)
IMM Granulocytes: 0.4 %
Lymph # K/uL: 2.3 10*3/uL (ref 1.2–3.7)
Lymphocyte %: 32.4 %
MCH: 25 pg/cell — ABNORMAL LOW (ref 26–32)
MCHC: 29 g/dL — ABNORMAL LOW (ref 32–36)
MCV: 86 fL (ref 79–95)
Mono # K/uL: 0.8 10*3/uL (ref 0.2–0.9)
Monocyte %: 11 %
Neut # K/uL: 3.8 10*3/uL (ref 1.6–6.1)
Nucl RBC # K/uL: 0 10*3/uL (ref 0.0–0.0)
Nucl RBC %: 0 /100 WBC (ref 0.0–0.2)
Platelets: 330 10*3/uL (ref 160–370)
RBC: 3 MIL/uL — ABNORMAL LOW (ref 3.9–5.2)
RDW: 17.5 % — ABNORMAL HIGH (ref 11.7–14.4)
Seg Neut %: 53.7 %
WBC: 7 10*3/uL (ref 4.0–10.0)

## 2014-06-19 LAB — HM HIV SCREENING OFFERED

## 2014-06-19 LAB — PROTIME-INR
INR: 0.9 — ABNORMAL LOW (ref 1.0–1.2)
Protime: 10.5 s (ref 9.2–12.3)

## 2014-06-19 LAB — HOLD GREEN WITH GEL

## 2014-06-19 MED ORDER — SODIUM CHLORIDE 0.9 % IV SOLN WRAPPED *I*
125.0000 mL/h | Status: DC
Start: 2014-06-19 — End: 2014-06-21
  Administered 2014-06-20 (×2): 125 mL/h via INTRAVENOUS

## 2014-06-19 MED ORDER — ALBUTEROL SULFATE (2.5 MG/3ML) 0.083% IN NEBU *I*
2.5000 mg | INHALATION_SOLUTION | RESPIRATORY_TRACT | Status: DC | PRN
Start: 2014-06-19 — End: 2014-06-24

## 2014-06-19 MED ORDER — HYDROMORPHONE HCL PF 1 MG/ML IJ SOLN *WRAPPED*
0.5000 mg | INTRAMUSCULAR | Status: DC | PRN
Start: 2014-06-19 — End: 2014-06-21
  Administered 2014-06-20 – 2014-06-21 (×9): 0.5 mg via INTRAVENOUS
  Filled 2014-06-19 (×9): qty 1

## 2014-06-19 MED ORDER — MORPHINE SULFATE 15 MG PO TABS *I*
7.5000 mg | ORAL_TABLET | Freq: Once | ORAL | Status: AC
Start: 2014-06-19 — End: 2014-06-19
  Administered 2014-06-19: 7.5 mg via ORAL
  Filled 2014-06-19: qty 1

## 2014-06-19 MED ORDER — ALBUTEROL SULFATE HFA 108 (90 BASE) MCG/ACT IN AERS *I*
1.0000 | INHALATION_SPRAY | RESPIRATORY_TRACT | Status: DC | PRN
Start: 2014-06-19 — End: 2014-06-19

## 2014-06-19 MED ORDER — MORPHINE SULFATE 4 MG/ML IJ SOLN
4.0000 mg | INTRAMUSCULAR | Status: DC | PRN
Start: 2014-06-19 — End: 2014-06-19

## 2014-06-19 MED ORDER — CEFAZOLIN 2000 MG IN 100 ML D5W *I*
2000.0000 mg | INTRAVENOUS | Status: AC
Start: 2014-06-19 — End: 2014-06-21
  Administered 2014-06-21: 3 g via INTRAVENOUS

## 2014-06-19 MED ORDER — ONDANSETRON HCL 2 MG/ML IV SOLN *I*
4.0000 mg | Freq: Four times a day (QID) | INTRAMUSCULAR | Status: DC | PRN
Start: 2014-06-19 — End: 2014-06-24
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
## 2014-06-19 NOTE — Progress Notes (Signed)
Pt transported from [facility] to the Emergency Department for further evaluation and treatment.  DATAS handoff tool provided by RN.  Pt transferred via wheelchair to the ED by transport.  DATAS forms given to transporter on arrival.

## 2014-06-19 NOTE — Telephone Encounter (Signed)
Patient returning call stating she was suppose to receive a call back. Patient can be reached at [phone]
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
## 2014-06-19 NOTE — Progress Notes (Signed)
Writer spoke with patient and cousin- they would like cousin to head home with children, as its been difficult to keep them busy in ED [place]. [place], they will be picked up in 15-20 mins. Patient and cousin appreciative.    Corinna LinesMandy Paislei Dorval, [place]  [phone]
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
## 2014-06-19 NOTE — Progress Notes (Signed)
[facility] her 2 small children and her cousin. Pt reports they came by medicab.    Pt needs to be admitted to the ED. Pt's cousin states she can care for pt's children while pt is in the ED but that they will need transportation home.      Plan:  SW spoke with ED SWer Corinna Lines(Mandy Worden) who will arrange for pt's cousin and children to take a taxi home.  SW to remain available prn.
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
## 2014-06-19 NOTE — ED Notes (Signed)
Assumed care of patient. Pt reports that he "leg is itching." Pt asking to take cast off, Ortho at bedside at this time. Will continue to monitor.
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
## 2014-06-20 ENCOUNTER — Encounter: Payer: Self-pay | Admitting: Orthopedic Surgery

## 2014-06-20 LAB — CHEMICAL DEPENDENCY SCREEN 8, URINE
Amphetamine,UR: NEGATIVE
Barbiturate,UR: NEGATIVE
Benzodiazepinen,UR: NEGATIVE
Cocaine/Metab,UR: POSITIVE
Methadone Metab,UR: NEGATIVE
Opiates,UR: POSITIVE
PCP,UR: NEGATIVE
Propoxyphene,UR: NEGATIVE
THC Metabolite,UR: NEGATIVE

## 2014-06-20 LAB — BASIC METABOLIC PANEL
Anion Gap: 15 (ref 7–16)
CO2: 24 mmol/L (ref 20–28)
Calcium: 8.9 mg/dL (ref 8.8–10.2)
Chloride: 104 mmol/L (ref 96–108)
Creatinine: 0.87 mg/dL (ref 0.51–0.95)
GFR,Black: 104 *
GFR,Caucasian: 91 *
Glucose: 92 mg/dL (ref 60–99)
Lab: 16 mg/dL (ref 6–20)
Potassium: 4.3 mmol/L (ref 3.3–5.1)
Sodium: 143 mmol/L (ref 133–145)

## 2014-06-20 LAB — EKG 12-LEAD
P: 41 degrees
QRS: 40 degrees
Rate: 86 {beats}/min
Severity: BORDERLINE
Severity: BORDERLINE
Statement: BORDERLINE
T: -1 degrees

## 2014-06-20 LAB — MRSA (ORSA) AMPLIFICATION: MRSA (ORSA) Amplification: 0

## 2014-06-20 LAB — PREGNANCY, URINE: Preg Test,UR: NEGATIVE m[IU]/mL

## 2014-06-20 LAB — TYPE AND SCREEN
ABO RH Blood Type: A POS
Antibody Screen: NEGATIVE

## 2014-06-20 MED ORDER — INFLUENZA VAC SPLIT QUAD (FLULAVAL) 0.5 ML IM SUSY PF(>=6 MONTHS) *I*
0.5000 mL | PREFILLED_SYRINGE | INTRAMUSCULAR | Status: DC
Start: 2014-06-20 — End: 2014-06-21
  Filled 2014-06-20: qty 0.5
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
## 2014-06-20 NOTE — Progress Notes (Signed)
Progress Note      Patient:Jenna [name] MRN: [id number] DOA: 06/19/2014    S: No events overnight. Pain controlled. NPO since MN; IVF.    O:  Temp:  [36.4 C (97.5 F)-36.7 C (98.1 F)] 36.6 C (97.9 F)  Heart Rate:  [77-90] 77  Resp:  [17-20] 20  BP: (110-154)/(66-88) 154/73 mmHg    Recent Labs  Lab 06/19/14  2328   WBC 7.0   HEMOGLOBIN 7.5*   HEMATOCRIT 26*   PLATELETS 330       Recent Labs  Lab 06/19/14  2328   SODIUM 143   POTASSIUM 4.3   CHLORIDE 104   CO2 24       No components found with this basename: BUN, CREATININE, LABGLOM, GLUCOSE, CALCIUM    Recent Labs  Lab 06/19/14  2328   PT/INR 0.9*       No components found with this basename: APTT    PE:  NAD, AAOx3  RLE:   - splint C/D/I  - SILT over exposed toes and 1st DWS aspects of foot  - Active flexion/ extension of toes  - toes WWP, capillary refill < 3 seconds       A/P: 29 [facility]. [facility]    Consented and booked.  NPO, IVF  IV Analgesia  DVT Prophylaxis: on hold for OR  Weight-bearing status: NWB RLE  Abx on call to OR      Sherian ReinZachary Sula Fetterly, MD   Orthopaedic Surgery Resident  06/20/2014 5:48 AM

## 2014-06-20 NOTE — Progress Notes (Signed)
Utilization Management    Level of Care Inpatient as of the date 06/19/2014      Lisbet Busker E Hudson Majkowski, RN     Pager: [phone]

## 2014-06-21 ENCOUNTER — Encounter
Admission: EM | Disposition: A | Discharge status: Home Health | Payer: Self-pay | Source: Ambulatory Visit | Attending: Orthopedic Surgery

## 2014-06-21 ENCOUNTER — Encounter: Payer: Self-pay | Admitting: Pain Medicine

## 2014-06-21 ENCOUNTER — Other Ambulatory Visit: Payer: Self-pay | Admitting: Orthopedic Surgery

## 2014-06-21 LAB — HIV 1&2 ANTIGEN/ANTIBODY: HIV 1&2 ANTIGEN/ANTIBODY: NONREACTIVE

## 2014-06-21 LAB — COCAINE, URINE, CONFIRMATION: Confirm COC/METAB: POSITIVE

## 2014-06-21 SURGERY — ORIF, ANKLE
Anesthesia: General | Site: Ankle | Laterality: Right | Wound class: Clean

## 2014-06-21 MED ORDER — SODIUM CHLORIDE 0.9 % IV SOLN WRAPPED *I*
3000.0000 mg | Freq: Three times a day (TID) | INTRAMUSCULAR | Status: AC
Start: 2014-06-21 — End: 2014-06-22
  Administered 2014-06-21 – 2014-06-22 (×2): 3000 mg via INTRAVENOUS
  Filled 2014-06-21 (×2): qty 30

## 2014-06-21 MED ORDER — FENTANYL CITRATE 50 MCG/ML IJ SOLN *WRAPPED*
INTRAMUSCULAR | Status: AC
Start: 2014-06-21 — End: 2014-06-21
  Filled 2014-06-21: qty 2

## 2014-06-21 MED ORDER — MIDAZOLAM HCL 1 MG/ML IJ SOLN *I* WRAPPED
INTRAMUSCULAR | Status: AC
Start: 2014-06-21 — End: 2014-06-21
  Filled 2014-06-21: qty 2

## 2014-06-21 MED ORDER — OXYCODONE HCL 5 MG PO TABS *I*
5.0000 mg | ORAL_TABLET | ORAL | Status: DC | PRN
Start: 2014-06-21 — End: 2014-06-24

## 2014-06-21 MED ORDER — BUPIVACAINE HCL 0.5 % IJ SOLUTION *WRAPPED*
INTRAMUSCULAR | Status: DC | PRN
Start: 2014-06-21 — End: 2014-06-21
  Administered 2014-06-21: 30 mL via SUBCUTANEOUS

## 2014-06-21 MED ORDER — MIDAZOLAM HCL 1 MG/ML IJ SOLN *I* WRAPPED
INTRAMUSCULAR | Status: DC | PRN
Start: 2014-06-21 — End: 2014-06-21
  Administered 2014-06-21: 2 mg via INTRAVENOUS

## 2014-06-21 MED ORDER — HALOPERIDOL LACTATE 5 MG/ML IJ SOLN *I*
INTRAMUSCULAR | Status: AC
Start: 2014-06-21 — End: 2014-06-21
  Filled 2014-06-21: qty 1

## 2014-06-21 MED ORDER — PROPOFOL 10 MG/ML IV EMUL (INTERMITTENT DOSING) WRAPPED *I*
INTRAVENOUS | Status: AC
Start: 2014-06-21 — End: 2014-06-21
  Filled 2014-06-21: qty 20

## 2014-06-21 MED ORDER — KETAMINE HCL 10 MG/ML IJ/IV SOLN *WRAPPED*
Status: AC
Start: 2014-06-21 — End: 2014-06-21
  Filled 2014-06-21: qty 20

## 2014-06-21 MED ORDER — SUCCINYLCHOLINE CHLORIDE 20 MG/ML IV/IJ SOLN *WRAPPED*
Status: DC | PRN
Start: 2014-06-21 — End: 2014-06-21
  Administered 2014-06-21: 160 mg via INTRAVENOUS

## 2014-06-21 MED ORDER — HALOPERIDOL LACTATE 5 MG/ML IJ SOLN *I*
0.5000 mg | Freq: Once | INTRAMUSCULAR | Status: DC | PRN
Start: 2014-06-21 — End: 2014-06-21

## 2014-06-21 MED ORDER — ONDANSETRON HCL 2 MG/ML IV SOLN *I*
INTRAMUSCULAR | Status: DC | PRN
Start: 2014-06-21 — End: 2014-06-21
  Administered 2014-06-21: 4 mg via INTRAMUSCULAR

## 2014-06-21 MED ORDER — SODIUM CHLORIDE 0.9 % IR SOLN *I*
Status: DC | PRN
Start: 2014-06-21 — End: 2014-06-21
  Administered 2014-06-21: 1000 mL

## 2014-06-21 MED ORDER — LIDOCAINE HCL 2 % IJ SOLN *I*
INTRAMUSCULAR | Status: DC | PRN
Start: 2014-06-21 — End: 2014-06-21
  Administered 2014-06-21: 100 mg via INTRAVENOUS

## 2014-06-21 MED ORDER — LACTATED RINGERS IV SOLN *I*
INTRAVENOUS | Status: DC | PRN
Start: 2014-06-21 — End: 2014-06-21

## 2014-06-21 MED ORDER — HYDROMORPHONE HCL PF 1 MG/ML IJ SOLN *WRAPPED*
0.5000 mg | INTRAMUSCULAR | Status: DC | PRN
Start: 2014-06-21 — End: 2014-06-24
  Administered 2014-06-21 – 2014-06-22 (×9): 0.5 mg via INTRAVENOUS
  Filled 2014-06-21 (×9): qty 1

## 2014-06-21 MED ORDER — MAGNESIUM HYDROXIDE 400 MG/5ML PO SUSP *I*
30.0000 mL | Freq: Every day | ORAL | Status: DC | PRN
Start: 2014-06-21 — End: 2014-06-24

## 2014-06-21 MED ORDER — NEOSTIGMINE METHYLSULFATE 10 MG/10ML IV SOLN *I*
INTRAVENOUS | Status: AC
Start: 2014-06-21 — End: 2014-06-21
  Filled 2014-06-21: qty 3

## 2014-06-21 MED ORDER — DEXAMETHASONE SODIUM PHOSPHATE 4 MG/ML INJ SOLN *WRAPPED*
INTRAMUSCULAR | Status: DC | PRN
Start: 2014-06-21 — End: 2014-06-21
  Administered 2014-06-21: 4 mg via INTRAVENOUS

## 2014-06-21 MED ORDER — LIDOCAINE HCL 2 % (PF) IJ SOLN *I*
INTRAMUSCULAR | Status: AC
Start: 2014-06-21 — End: 2014-06-21
  Filled 2014-06-21: qty 5

## 2014-06-21 MED ORDER — BISACODYL 10 MG RE SUPP *I*
10.0000 mg | Freq: Every day | RECTAL | Status: DC | PRN
Start: 2014-06-21 — End: 2014-06-24

## 2014-06-21 MED ORDER — POLYETHYLENE GLYCOL 3350 PO PACK 17 GM *I*
17.0000 g | PACK | Freq: Every day | ORAL | Status: DC
Start: 2014-06-21 — End: 2014-06-24
  Administered 2014-06-21 – 2014-06-23 (×3): 17 g via ORAL
  Filled 2014-06-21 (×3): qty 17

## 2014-06-21 MED ORDER — FENTANYL CITRATE 50 MCG/ML IJ SOLN *WRAPPED*
INTRAMUSCULAR | Status: AC
Start: 2014-06-21 — End: 2014-06-21
  Filled 2014-06-21: qty 5

## 2014-06-21 MED ORDER — KETAMINE HCL 10 MG/ML IJ/IV SOLN *WRAPPED*
Status: DC | PRN
Start: 2014-06-21 — End: 2014-06-21
  Administered 2014-06-21 (×2): 50 mg via INTRAVENOUS

## 2014-06-21 MED ORDER — ROCURONIUM BROMIDE 10 MG/ML IV SOLN *WRAPPED*
Status: DC | PRN
Start: 2014-06-21 — End: 2014-06-21
  Administered 2014-06-21: 10 mg via INTRAVENOUS
  Administered 2014-06-21: 30 mg via INTRAVENOUS

## 2014-06-21 MED ORDER — SUCCINYLCHOLINE CHLORIDE 20 MG/ML IV/IJ SOLN *WRAPPED*
Status: AC
Start: 2014-06-21 — End: 2014-06-21
  Filled 2014-06-21: qty 10

## 2014-06-21 MED ORDER — FENTANYL CITRATE 50 MCG/ML IJ SOLN *WRAPPED*
INTRAMUSCULAR | Status: DC | PRN
Start: 2014-06-21 — End: 2014-06-21
  Administered 2014-06-21: 100 ug via INTRAVENOUS
  Administered 2014-06-21: 250 ug via INTRAVENOUS
  Administered 2014-06-21 (×2): 50 ug via INTRAVENOUS

## 2014-06-21 MED ORDER — NEOSTIGMINE METHYLSULFATE 10 MG/10ML IV SOLN *I*
INTRAVENOUS | Status: DC | PRN
Start: 2014-06-21 — End: 2014-06-21
  Administered 2014-06-21: 2 mg via INTRAVENOUS

## 2014-06-21 MED ORDER — PROMETHAZINE HCL 25 MG/ML IJ SOLN *I*
6.2500 mg | Freq: Once | INTRAMUSCULAR | Status: AC | PRN
Start: 2014-06-21 — End: 2014-06-21

## 2014-06-21 MED ORDER — HYDROMORPHONE HCL PF 1 MG/ML IJ SOLN *WRAPPED*
0.5000 mg | Freq: Once | INTRAMUSCULAR | Status: AC
Start: 2014-06-21 — End: 2014-06-21
  Administered 2014-06-21: 0.5 mg via INTRAVENOUS
  Filled 2014-06-21: qty 1

## 2014-06-21 MED ORDER — ACETAMINOPHEN 10 MG/ML IV SOLN *I*
INTRAVENOUS | Status: DC | PRN
Start: 2014-06-21 — End: 2014-06-21
  Administered 2014-06-21: 1000 mg via INTRAVENOUS

## 2014-06-21 MED ORDER — HYDROMORPHONE HCL 2 MG/ML IJ SOLN *WRAPPED*
0.4000 mg | INTRAMUSCULAR | Status: DC | PRN
Start: 2014-06-21 — End: 2014-06-21
  Administered 2014-06-21 (×2): 0.4 mg via INTRAVENOUS

## 2014-06-21 MED ORDER — DOCUSATE SODIUM 100 MG PO CAPS *I*
200.0000 mg | ORAL_CAPSULE | Freq: Every evening | ORAL | Status: DC
Start: 2014-06-21 — End: 2014-06-24
  Administered 2014-06-21 – 2014-06-23 (×3): 200 mg via ORAL
  Filled 2014-06-21 (×4): qty 2

## 2014-06-21 MED ORDER — ACETAMINOPHEN 500 MG PO TABS *I*
1000.0000 mg | ORAL_TABLET | Freq: Three times a day (TID) | ORAL | Status: DC
Start: 2014-06-21 — End: 2014-06-24
  Administered 2014-06-21 – 2014-06-24 (×9): 1000 mg via ORAL
  Filled 2014-06-21 (×9): qty 2

## 2014-06-21 MED ORDER — GLYCOPYRROLATE 0.2 MG/ML IJ SOLN *I*
INTRAMUSCULAR | Status: DC | PRN
Start: 2014-06-21 — End: 2014-06-21
  Administered 2014-06-21 (×2): 0.2 mg via INTRAVENOUS

## 2014-06-21 MED ORDER — ONDANSETRON HCL 2 MG/ML IV SOLN *I*
INTRAMUSCULAR | Status: AC
Start: 2014-06-21 — End: 2014-06-21
  Filled 2014-06-21: qty 2

## 2014-06-21 MED ORDER — SENNOSIDES 8.6 MG PO TABS *I*
2.0000 | ORAL_TABLET | Freq: Every day | ORAL | Status: DC
Start: 2014-06-21 — End: 2014-06-24
  Administered 2014-06-21 – 2014-06-23 (×3): 2 via ORAL
  Filled 2014-06-21 (×3): qty 2

## 2014-06-21 MED ORDER — CEFAZOLIN SODIUM 1000 MG IJ SOLR *I*
INTRAMUSCULAR | Status: AC
Start: 2014-06-21 — End: 2014-06-21
  Filled 2014-06-21: qty 30

## 2014-06-21 MED ORDER — PROPOFOL 10 MG/ML IV EMUL (INTERMITTENT DOSING) WRAPPED *I*
INTRAVENOUS | Status: DC | PRN
Start: 2014-06-21 — End: 2014-06-21
  Administered 2014-06-21: 200 mg via INTRAVENOUS

## 2014-06-21 MED ORDER — DEXAMETHASONE SODIUM PHOSPHATE 4 MG/ML INJ SOLN *WRAPPED*
INTRAMUSCULAR | Status: AC
Start: 2014-06-21 — End: 2014-06-21
  Filled 2014-06-21: qty 1

## 2014-06-21 MED ORDER — HYDROMORPHONE HCL 2 MG/ML IJ SOLN *WRAPPED*
INTRAMUSCULAR | Status: AC
Start: 2014-06-21 — End: 2014-06-21
  Administered 2014-06-21: 0.4 mg via INTRAVENOUS
  Filled 2014-06-21: qty 1

## 2014-06-21 MED ORDER — ROCURONIUM BROMIDE 10 MG/ML IV SOLN *WRAPPED*
Status: AC
Start: 2014-06-21 — End: 2014-06-21
  Filled 2014-06-21: qty 5

## 2014-06-21 MED ORDER — GLYCOPYRROLATE 1 MG / 5 ML IJ SOLN *I*
INTRAMUSCULAR | Status: AC
Start: 2014-06-21 — End: 2014-06-21
  Filled 2014-06-21: qty 5

## 2014-06-21 MED ORDER — OXYCODONE HCL 5 MG PO TABS *I*
10.0000 mg | ORAL_TABLET | ORAL | Status: DC | PRN
Start: 2014-06-21 — End: 2014-06-24
  Administered 2014-06-21 – 2014-06-24 (×28): 10 mg via ORAL
  Filled 2014-06-21 (×30): qty 2

## 2014-06-21 MED ORDER — ENOXAPARIN SODIUM 40 MG/0.4ML IJ SOSY *I*
40.0000 mg | PREFILLED_SYRINGE | Freq: Every day | INTRAMUSCULAR | Status: DC
Start: 2014-06-21 — End: 2014-06-24
  Administered 2014-06-21 – 2014-06-23 (×3): 40 mg via SUBCUTANEOUS
  Filled 2014-06-21 (×3): qty 0.4

## 2014-06-21 MED ORDER — PROMETHAZINE HCL 25 MG/ML IJ SOLN *I*
INTRAMUSCULAR | Status: AC
Start: 2014-06-21 — End: 2014-06-21
  Administered 2014-06-21: 6.25 mg via INTRAVENOUS
  Filled 2014-06-21: qty 1

## 2014-06-21 MED ORDER — SODIUM CHLORIDE 0.9 % IV SOLN WRAPPED *I*
150.0000 mL/h | Status: DC
Start: 2014-06-21 — End: 2014-06-24
  Administered 2014-06-21: 150 mL/h via INTRAVENOUS

## 2014-06-21 SURGICAL SUPPLY — 52 items
3.5mm locking distal fibula plate, 6 holes, right. ×2 IMPLANT
APPLICATOR CHLORAPREP 26ML ORANGE LARGE (Solution) ×8 IMPLANT
BANDAGE ELASTIC DELUXE 4IN LF (Dressing) ×2 IMPLANT
BANDAGE ESMARK 6IN LF STER USE 219461 (Dressing) ×3 IMPLANT
BIT 2.0MM DRILL WITH DEP 140MM (Supply) ×2 IMPLANT
BIT DRILL QC 3.5MM110MM (Supply) ×2 IMPLANT
BIT DRILL QC GOLD2.5MMX 110MM (Supply) ×4 IMPLANT
BLADE ELECTRODE COATED 2.5IN (Supply) ×2 IMPLANT
CUFF TOURNIQUET SPSB PLC 34IN STER DISP (Supply) ×3 IMPLANT
DRAPE C ARM OEC 12IN (Drape) ×1 IMPLANT
DRAPE C-ARM 42 IN X 120 IN S1 (Drape) ×3 IMPLANT
DRAPE EXTREMITY 89X128 (Drape) ×3 IMPLANT
DRAPE ORTHOMAX BODY SPLIT (Drape) ×2 IMPLANT
DRAPE SHEET 40X70 MED (Drape) ×1 IMPLANT
DRAPE U-SHEET SPLIT PLASTIC STERIL (Drape) ×3 IMPLANT
DRESSING FLUFF SUPER SP 6 X 6.75IN STER (Dressing) ×2 IMPLANT
DRESSING NON-ADHERENT 3 X 8IN STER (Dressing) ×2 IMPLANT
FILTER NEPTUNE 4PORT MANIFOLD (Supply) ×3 IMPLANT
GAUZE XEROFORM PETROLATUM 1 X 8IN (Dressing) ×1 IMPLANT
GLOVE LINER BIOGEL INDICATOR SZ6.5 LTX (Glove) ×1 IMPLANT
GLOVE SURG BIOGEL SZ7.5 LTX STER PF (Glove) ×2 IMPLANT
GLOVE SURG TRIFLEX SZ6.5 PWD LTX (Glove) ×2 IMPLANT
GLOVE SURG ULTAFREE MAX SZ9 PF LTX (Glove) ×6 IMPLANT
GOWN SIRUS FABRIC REINFORCED SET IN XL (Gown) ×1 IMPLANT
KIT SKIN SCRUB (Pack) ×1
MAT HOVER SGL PATIENT USE 39IN (Supply) ×2 IMPLANT
PACK CUSTOM ORTHO EXTREMITY CDS (Pack) ×3 IMPLANT
PACK TOWEL ORTHO LIGHT BLUE STER (Supply) ×9 IMPLANT
PADDING WEBRIL 4IN LF NONSTER (Dressing) ×2 IMPLANT
PADDING WEBRIL 4IN LF STER (Dressing) ×2 IMPLANT
SCREW BNE L10MM DIA2.7MM CORT SS ST LOK FT T8 STARDRV RECESS (Screw) ×2 IMPLANT
SCREW BNE L12MM DIA2.7MM CORT SS ST LOK FT T8 STARDRV RECESS (Screw) ×2 IMPLANT
SCREW BNE L12MM DIA3.5MM CORT SS ST LOK FT STARDRV RECESS FOR SM FRAG PLATING SYS (Screw) ×2 IMPLANT
SCREW BNE L14MM DIA3.5MM CORT SS ST NONCANNULATED LOK FT SM HEX SOCK (Screw) ×2 IMPLANT
SCREW BNE L16MM DIA2.7MM CORT SS ST LOK FT T8 STARDRV RECESS (Screw) ×2 IMPLANT
SCREW BNE L18MM DIA2.7MM CORT SS ST LOK FT T8 STARDRV RECESS (Screw) ×2 IMPLANT
SCREW BNE L45MM DIA3.5MM CORT SS ST NONCANNULATED LOK FT SM HEX SOCK (Screw) ×2 IMPLANT
SCREW BNE L50MM DIA3.5MM CORT SS ST NONCANNULATED LOK FT SM HEX SOCK (Screw) IMPLANT
SCREW CORTEX SLF-TAP 2.7MM 10MM (Screw) ×2 IMPLANT
SOL SOD CHL IRRIG 1000ML BTL (Solution) ×1
SOL SOD CHL IRRIG 1500ML BTL (Solution) ×1 IMPLANT
SOL SODIUM CHLORIDE IRRIG 1000ML BTL (Solution) ×2 IMPLANT
SPLINT GYPSONA PLASTER FAST 5 X 30IN (Dressing) ×17 IMPLANT
STAPLER SKIN 35W NL (Supply) ×2
STAPLER SKIN WIDE STPL LEG L3.9MM WIRE DIA0.58MM FIX HD PROX (Supply) ×1 IMPLANT
SUTR CHROMIC GUT 2-0 CT-1 27IN (Suture) ×4 IMPLANT
SUTR ETHILON 3/0 18IN PS-1 P-14 BLACK (Suture) ×2 IMPLANT
SUTR ETHILON MONO 3-0 PS-1 BLACK (Suture) ×1 IMPLANT
SUTR VICRYL ANTIB BRD 2-0 CP-2 18 UNDYED (Suture) ×1 IMPLANT
SUTR VICRYL CTD 0 CTX 36 VIOLET (Suture) ×1 IMPLANT
SUTR VICRYL PLUS 0 CT-1 8-18 UNDY (Suture) ×2 IMPLANT
TRAY SCRUB DRY SKIN INCLUDES 6 WINGED SPONGES 6 SPONGE STICKS 2 COTTON TIP APPLICATORS ABSORBENT/BLOTTING TOWELS PREMIUM (Pack) IMPLANT

## 2014-06-21 NOTE — Anesthesia Postprocedure Evaluation (Signed)
Anesthesia Post-Op Note    Patient: Jenna [name]    Procedure(s) Performed:  Procedure Summary     Date Anesthesia Start Anesthesia Stop Room / Location    06/21/14 0817 [id number] [id number] [facility]       Procedure Diagnosis Surgeon Attending Anesthesia    R ANKLE ORIF (Right Ankle) Ankle fracture, right, closed, initial encounter Humphrey, Santina Evansatherine, MD Verdie ShireKim, Cashmere Dingley, MD     (Ankle fracture, right, closed, initial encounter [S82.891A])          Anesthesia type:  General  Complications Noted (Any):  None   Comment:    Patient Location:  PACU  Level of Consciousness:    Recovered to baseline  Patient Participation:     Able to participate  Temperature Status:    Normothermic  Oxygen Saturation:    Within patient's normal range  Cardiac Status:   Within patient's normal range  Fluid Status:    Stable  Airway Patency:     Yes  Pulmonary Status:    Baseline  Pain Management:    Adequate analgesia  Nausea and Vomiting:  None    Post Op Assessment:    Tolerated procedure well and no evidence of recall   Attending Attestation:  All indicated post anesthesia care provided

## 2014-06-21 NOTE — Progress Notes (Addendum)
Progress Note      Patient:Jenna [name] MRN: [id number] DOA: 06/19/2014    S: No events overnight. Pain controlled. NPO    O:  Temp:  [36.3 C (97.3 F)-36.8 C (98.2 F)] 36.5 C (97.7 F)  Heart Rate:  [69-84] 77  Resp:  [18-20] 20  BP: (109-135)/(58-84) 109/58 mmHg    Recent Labs  Lab 06/19/14  2328   WBC 7.0   HEMOGLOBIN 7.5*   HEMATOCRIT 26*   PLATELETS 330       Recent Labs  Lab 06/19/14  2328   SODIUM 143   POTASSIUM 4.3   CHLORIDE 104   CO2 24       No components found with this basename: BUN, CREATININE, LABGLOM, GLUCOSE, CALCIUM    Recent Labs  Lab 06/19/14  2328   PT/INR 0.9*       No components found with this basename: APTT    PE:  NAD, AAOx3  RLE:   - splint C/D/I  - SILT over exposed toes and 1st DWS aspects of foot  - Active flexion/ extension of toes  - toes WWP, capillary refill < 3 seconds       A/P: 29 [facility]. [facility]    Consented and booked.  NPO, IVF  IV Analgesia  DVT Prophylaxis: on hold for OR  Weight-bearing status: NWB RLE  Abx on call to OR    Jenna Connaymond Kenney, MD   Orthopaedic Surgery Resident  06/21/2014 6:42 AM    Orthopaedic Trauma Attending    I saw and examined the patient.  I have reviewed the resident's note and agree with their finding and plan as documented above.  UPDATES TO PATIENT'S CONDITION on the DAY OF SURGERY/PROCEDURE    I. Updates to Patient's Condition (to be completed by a provider privileged to complete a H&P, following reassessment of the patient by the provider):    (Inpatients only): I confirm that progress notes within the past 24 hours document updates to the patient's condition.            II. Procedure Readiness   I have reviewed the patient's H&P and updated condition. By completing and signing this form, I attest that this patient is ready for surgery/procedure.      III. Attestation   I have reviewed the updated information regarding the patient's condition and it is appropriate to proceed with the planned  surgery/procedure.  Procedure reviewed. Importance of avoiding weightbearing was reinforced. She agreed.   Sharlene MottsATHERINE Daymeon Fischman, MD as of 8:13 AM 06/21/2014   Sharlene MottsATHERINE Bayyinah Dukeman, MD

## 2014-06-21 NOTE — Progress Notes (Signed)
Physical therapy note:    PT evaluation order received. Pt is POD 0, will continue to follow pt and re-attempt beginning POD 1. Please page writer if any further needs arise.     Maxi Carreras A. Margo AyeHall, PT, DPT  Pager [phone]

## 2014-06-21 NOTE — Anesthesia Procedure Notes (Signed)
---------------------------------------------------------------------------------------------------------------------------------------    AIRWAY   GENERAL INFORMATION AND STAFF    Patient location during procedure: OR       Date of Procedure: 06/21/2014 8:50 AM  CONDITION PRIOR TO MANIPULATION     Current Airway/Neck Condition:  Normal        For more airway physical exam details, see Anesthesia PreOp Evaluation  AIRWAY METHOD     Patient Position:  Sniffing    Preoxygenated: yes      Induction: IV    Mask Difficulty Assessment:  1 - vent by mask       Mask NMB: 1 - vent by mask      Technique Used for Successful ETT Placement:  Direct laryngoscopy    Blade Type:  Miller    Laryngoscope Blade/Video laryngoscope Blade Size:  2    Cormack-Lehane Classification:  Grade IIa - partial view of glottis    Placement Verified by: capnometry and auscultation      Number of Attempts at Approach:  1    Number of Other Approaches Attempted:  0  FINAL AIRWAY DETAILS    Final Airway Type:  Endotracheal airway    Final Endotracheal Airway:  ETT    Insertion Site:  Oral    ETT Size (mm):  7.0    Distance inserted from Lips (cm):  22  ----------------------------------------------------------------------------------------------------------------------------------------

## 2014-06-21 NOTE — INTERIM OP NOTE (Addendum)
Interim Op Note (Surgical Log ID: [id number])       Date of Surgery: 06/21/2014       Surgeons: Surgeon(s) and Role:     * Sharlene MottsHumphrey, Catherine, MD - Primary     * Nira ConnKenney, Orlandis Sanden, MD - Resident - Assisting       Pre-op Diagnosis: Pre-Op Diagnosis Codes:     * Ankle fracture, right, closed, initial encounter [S82.891A]       Post-op Diagnosis: Post-Op Diagnosis Codes:     * Ankle fracture, right, closed, initial encounter [S82.891A]       Procedure(s) Performed: ORIF R ankle       Additional CPT Codes:        Anesthesia Type: General        Fluid Totals: I/O this shift:  04/08 0700 - 04/08 1459  In: 900 (6.6 mL/kg) [I.V.:900]  Out: - (0 mL/kg)   Net: 900  Weight: 136.1 kg        Estimated Blood Loss: No Data Recorded       Specimens to Pathology:  * No specimens in log *       Temporary Implants:        Packing:                 Patient Condition: good       Findings (Including unexpected complications): Right ankle fracture     Signed:  Nira Connaymond Essie Gehret, MD  on 06/21/2014 at 10:28 AM     [id number]

## 2014-06-21 NOTE — Anesthesia Case Conclusion (Signed)
CASE CONCLUSION  Emergence  Actions:  Suctioned and extubated  Criteria Used for Airway Removal:  Adequate Tv & RR and acceptable O2 saturation  Assessment:  Routine  Transport  Directly to: PACU  Airway:  Nasal cannula  Oxygen Delivery:  4 lpm  Position:  Recumbent  Patient Condition on Handoff  Level of Consciousness:  Mildly sedated  Patient Condition:  Stable  Handoff Report to:  RN

## 2014-06-22 LAB — BASIC METABOLIC PANEL
Anion Gap: 12 (ref 7–16)
CO2: 28 mmol/L (ref 20–28)
Calcium: 8.6 mg/dL — ABNORMAL LOW (ref 8.8–10.2)
Chloride: 102 mmol/L (ref 96–108)
Creatinine: 0.9 mg/dL (ref 0.51–0.95)
GFR,Black: 100 *
GFR,Caucasian: 87 *
Glucose: 101 mg/dL — ABNORMAL HIGH (ref 60–99)
Lab: 9 mg/dL (ref 6–20)
Potassium: 4.6 mmol/L (ref 3.3–5.1)
Sodium: 142 mmol/L (ref 133–145)

## 2014-06-22 LAB — HEMATOCRIT: Hematocrit: 26 % — ABNORMAL LOW (ref 34–45)

## 2014-06-22 LAB — MCHC: MCHC: 28 g/dL — ABNORMAL LOW (ref 32–36)

## 2014-06-22 MED ORDER — NALBUPHINE HCL 10 MG/ML IJ SOLN *I*
3.0000 mg | INTRAMUSCULAR | Status: DC | PRN
Start: 2014-06-22 — End: 2014-06-24
  Filled 2014-06-22 (×3): qty 0.3

## 2014-06-22 MED ORDER — NALBUPHINE HCL 10 MG/ML IJ SOLN *I*
3.0000 mg | INTRAMUSCULAR | Status: DC | PRN
Start: 2014-06-22 — End: 2014-06-22
  Filled 2014-06-22: qty 0.3

## 2014-06-22 MED ORDER — DIPHENHYDRAMINE HCL 25 MG PO TABS *I*
25.0000 mg | ORAL_TABLET | ORAL | Status: DC | PRN
Start: 2014-06-22 — End: 2014-06-24
  Administered 2014-06-22 – 2014-06-24 (×3): 25 mg via ORAL
  Filled 2014-06-22 (×3): qty 1

## 2014-06-22 MED ORDER — CALCIUM CARBONATE ANTACID 500 MG PO CHEW *I*
1000.0000 mg | CHEWABLE_TABLET | Freq: Two times a day (BID) | ORAL | Status: DC | PRN
Start: 2014-06-22 — End: 2014-06-24

## 2014-06-22 NOTE — Op Note (Addendum)
PATIENTLANNAH, MESSINEO  MR #:  [id number]   ACCOUNT #:  [id number] DOB:  [birth year]    AGE:  28     SURGEON:  Sharlene Motts, MD  CO-SURGEON:    ASSISTANT:  Nira Conn, MD, RES.  SURGERY DATE:  06/21/2014    PREOPERATIVE DIAGNOSIS:  Right ankle fracture.    POSTOPERATIVE DIAGNOSIS:  Right ankle fracture.    OPERATIVE PROCEDURE:  Open reduction, internal fixation, right ankle.    ANESTHESIA:  General.    FLUID TOTAL:  See Anesthesia record.    ESTIMATED BLOOD LOSS:  5 cc.    SPECIMEN TO PATHOLOGY:  None.    IMPLANTS:  Synthes 3.5 mm locking distal fibular plate, 6 holes.    PACKING:  None.    DRAINS:  None.    PATIENT CONDITION:  Good.    FINDINGS:  Right ankle fracture.    INDICATIONS FOR PROCEDURE:  Zyon Cozzens is a morbidly obese 29 year old female who presented after being evaluated at an outside hospital for right ankle fracture.  She had previously used illicit drugs and had unclear story of mechanism.  X-ray imaging showed displaced distal fibula fracture with medial clear-space widening.  She was resplinted and consented for ORIF of the right ankle.    DESCRIPTION OF PROCEDURE:  Lorissa Guaderrama is a 29 year old morbidly obese female with a right ankle fracture as described above.  She was consented for ORIF of the right ankle.  She was met in the preanesthesia holding area.  All questions and concerns were addressed.  Consent was verified to be signed by the patient.  She was met by the operative surgeon and marked on the operative extremity.  She was met by the anesthesia team and transported back to the operating room.  Once in the operating room, the patient was transferred onto the operating room table and positioned supine with a bump under the right hip.  Patient had general anesthesia induced.  All bony prominences were well padded.  The right lower extremity was prepped and draped in the usual sterile fashion. after the splint was removed.     After hospital timeout, identifying the patient by  name, date of birth, side, site, and type of procedure to be performed, an incision was made laterally over the distal fibula.  Soft tissues were sharply dissected and carried down to bone.  The fracture was identified, and the fracture was cleaned using curettes and a pituitary rongeur.  Fracture had preliminary reduction which showed significantly more comminution than what was appreciated on the x-ray.  Given the comminution, the provisional reduction held, and distal tibial locking plate was chosen as the implant.  Synthes distal fibula locking plate was positioned onto the lateral aspect of the distal fibula.  This was verified to be in good position with AP and lateral fluoroscopic imaging.  A proximal 3.5 mm cortical screw was placed, securing the plate to the bone.  Next, a 2.7 mm cortical screw was placed distally.  The plate was in a good position.  Remainder of holes were filled with 3.5 mm locking screws proximally, 2.7 mm locking screws distally.  2 syndesmotic screws using 3.5 mm cortical screws were placed under fluoroscopic guidance.  Once all implants were in place and the fracture was deemed to be adequately reduced and stabilized, fluoroscopic imaging was again obtained with a mortise view and a lateral view of the ankle.  Imaging was acceptable.     The incision was thoroughly  irrigated.  The fascia was closed with 0 Vicryl in an interrupted figure-of-eight fashion.  The subcutaneous tissues were closed with 2-0 chromic in an interrupted buried fashion.  Skin was closed with staples.  The incision was washed and dried, and a sterile bandage was placed.  The tourniquet was taken down.  All counts were correct.  The patient was placed in an ankle splint after the drapes were taken down.  The patient was awoken from anesthesia and transferred onto the hospital bed.  The patient was transferred to PACU in good condition.  No adverse events during the surgery.  Dr. Andee Poles was present and available  throughout the entirety of the case.       Dictated By:  Nira Conn, MD,RES    ATTENDING ATTESTATION     I was present for the entire case. I personally participated in all key and critical portions.     Kaytie Ratcliffe A. Durinda Buzzelli MD  Associate Professor, Orthopaedic Trauma    ______________________________  Sharlene Motts, MD    RK/MODL  DD:  06/22/2014 09:40:56  DT:  06/22/2014 10:59:57  Job #:  [id number]    cc:

## 2014-06-22 NOTE — Plan of Care (Signed)
Impaired Balance     Patient will maintain balance to allow for safe mobility and Progressing towards goal        Impaired Mobility (musc)     Patient's mobility is maintained or improved (musc) Progressing towards goal        Post-Operative Bladder Elimination     Patient is able to empty bladder or return to baseline Progressing towards goal        Post-Operative Bowel Elimination     Elimination pattern is normal or improving Progressing towards goal        Post-Operative Complications     Prevent post-operative complications Progressing towards goal     Patient will remain free from symptoms of infection-post op Progressing towards goal        Post-Operative Hemodynamic Stability     Maintain Hemodynamic Stability Progressing towards goal        Potential for impaired Circulation, Motor, or Sensation     Patient's CMS status is maintained or improved from baseline Progressing towards goal          Pt used front wheel walker to ambulate to bathroom and back to bed, tolerated well. Pt educated on pain medication and that she needs to have her pain controlled on oral pain medication before she can go home. Will continue to monitor pt's pain and progress.  Judeth CornfieldStephanie Air cabin crewcheitinger, RN

## 2014-06-22 NOTE — Plan of Care (Signed)
Problem: Impaired Bed Mobility  Goal: STG - IMPROVE BED MOBILITY  Patient will perform bed mobility without rails and the head of bed flat with No assist (Independently)     Time frame: 1-3 days    Problem: Impaired Transfers  Goal: STG - IMPROVE TRANSFERS  Patient will complete Sit to stand transfers using least restrictive assistive device with Modified independence     Time frame: 1-3 days    Problem: Impaired Ambulation  Goal: LTG - IMPROVE AMBULATION  Patient will ambulate 50 to 99 feet using least restrictive assistive device with Modified independence    Time frame: 3-5 days        Problem: Impaired Stair Navigation  Goal: LTG - IMPROVE STAIR NAVIGATION  Patient will navigate 4-7 steps with 1  rail(s) and least restrictive assistive device and Modified independence     Time frame: 5-7 days

## 2014-06-22 NOTE — Progress Notes (Addendum)
Orthopaedic Surgery Progress Note  Patient:Jenna [name] MRN: [id number] DOA: 06/19/2014    S: Social issues. Pain moderately controlled. Tolerating PO.     O:  Temp:  [36 C (96.8 F)-37.2 C (99 F)] 36.5 C (97.7 F)  Heart Rate:  [66-92] 71  Resp:  [16-20] 16  BP: (126-167)/(60-96) 139/60 mmHg    Recent Labs  Lab 06/22/14  0259 06/19/14  2328   WBC  --  7.0   HEMOGLOBIN  --  7.5*   HEMATOCRIT 26* 26*   PLATELETS  --  330       Recent Labs  Lab 06/22/14  0259 06/19/14  2328   SODIUM 142 143   POTASSIUM 4.6 4.3   CHLORIDE 102 104   CO2 28 24       No components found with this basename: BUN, CREATININE, LABGLOM, GLUCOSE, CALCIUM    Recent Labs  Lab 06/19/14  2328   PT/INR 0.9*       No components found with this basename: APTT    Exam:  NAD, AAOx3    RLE: splint unraveled at the top.  SLTI over toes including 1st DWS.  Motor intact on toe flexion/extension and great toe extension.  Toes appear WWP.      A/P: 29 y.o. female s/p ORIF R ankle 06/21/14    Analgesia: prn  Diet: regular  DVT Prophylaxis: lovenox  Weight-bearing status: NWB RLE  PT/OT/OOB  Abx peri-op  Xrays reviewed  Dispo: pending pain control and PT clearance    Nira Connaymond Kenney, MD, 06/22/2014 8:30 AM  Orthopaedic Surgery Resident    I saw and evaluated the patient. I agree with the resident's/fellow's findings and plan of care as documented above.    Jerolyn CenterJohn P Eren Puebla, MD
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
## 2014-06-23 LAB — BASIC METABOLIC PANEL
Anion Gap: 13 (ref 7–16)
CO2: 26 mmol/L (ref 20–28)
Calcium: 8.5 mg/dL — ABNORMAL LOW (ref 8.8–10.2)
Chloride: 102 mmol/L (ref 96–108)
Creatinine: 0.98 mg/dL — ABNORMAL HIGH (ref 0.51–0.95)
GFR,Black: 90 *
GFR,Caucasian: 78 *
Glucose: 97 mg/dL (ref 60–99)
Lab: 12 mg/dL (ref 6–20)
Potassium: 4.7 mmol/L (ref 3.3–5.1)
Sodium: 141 mmol/L (ref 133–145)

## 2014-06-23 LAB — MCHC: MCHC: 29 g/dL — ABNORMAL LOW (ref 32–36)

## 2014-06-23 LAB — HEMATOCRIT: Hematocrit: 26 % — ABNORMAL LOW (ref 34–45)

## 2014-06-23 NOTE — Progress Notes (Signed)
Physical Therapy Treatment Note:    * Physical Therapist to review for discharge planning *     06/23/14 0930   Visit Number   Visit Number 2   Precautions/Observations   Precautions used Yes   Weight Bearing Status RLE NWB   LDA Observation None   Fall Precautions General falls precautions   Pain Assessment   *Is the patient currently in pain? Yes   Pain (Before,During, After) Therapy Before;During;After   0-10 Scale 6   Pain Location Ankle   Pain Orientation Right   Pain Descriptors Throbbing   Pain Intervention(s) Refer to nursing for pain management;Repositioned   Bed Mobility   Bed mobility Tested   Supine to Sit Independent   Additional comments Pt able to get herself in and out of bed with no assistance from Clinical research associatewriter.    Transfers   Transfers Tested   Sit to Stand Modified independent (device)   Stand to sit Modified independent (device)   Transfer Assistive Device rolling walker   Additional comments Pt tolerated standing from EOB and commode chair with no assistance from Clinical research associatewriter.    Mobility   Mobility Tested   Weight Bearing Status RLE   Weight Bearing Status RLE RLE NWB   Gait Pattern 2 point  (Hop to)   Ambulation Assist Modified independent (device)   Ambulation Distance (Feet) 60'x2   Ambulation Assistive Device rolling walker   Stairs Assistance Modified independent (device)   Stair Management Technique One rail;Step to pattern;Forwards;Sideways;With crutches   Number of Stairs 5   Additional comments Pt tolerated ambulation with RW to stairs from room. Once at stairs, pt required seated rest break while writer demonstrated proper technique on stairs. Pt tolerated ambulation on stairs well. Pt was able to safely demonstrate ambulation with crutch when ascending steps and hop to pattern with just one railing and no AD when descending. Pt reports that she has crutches at home.    Balance   Balance Tested   Sitting - Static Independent    Sitting - Dynamic Independent   Standing - Static Independent    Standing - Dynamic Independent   Assessment   Brief Assessment Patient demonstrates adequate mobility skills to return home   Plan/Recommendation   Hospital Stay Recommendations Out of bed with nursing assist;Ambulate daily with nursing assist   Discharge Recommendations Anticipate return to prior living arrangement;Home PT   PT Discharge Equipment Recommended Rolling Walker;Wheelchair   Assessment/Recommendations Reviewed With: Nursing;Patient   Time Calculation   PT Timed Codes 26   PT Untimed Codes 0   PT Unbilled Time 0   PT Total Treatment 26   PT Charges   PT [facility] Gait Training (15 min x2)   Jolly MangoLindsey S. Violanda Bobeck, PTA   (Pager # [phone])

## 2014-06-23 NOTE — Progress Notes (Signed)
Occupational Therapy Assessment       06/23/14 1030   OT Tracking   OT Tracking OT Discontinue   Precautions   Precautions used Yes   Weight Bearing Status ((R) LE NWB)   LDA Observation IV lines   Home Living (Prior to Admission)   Prior Living Situation Reported by patient   Type of Home Apartment  (2nd floor apartment)   Bedroom First floor   Bathroom First floor   # Steps to Enter Home 10  (With rail)   # Of Steps In Home 0   Bathroom Shower/Tub Tub/Shower unit  (Standard toilet (low as per pt.'s report))   Bathroom Accessibility Accessible via walker   Prior Function   Prior Function Reported by patient   Level of Independence Independent with ADLs and functional transfers   Lives With Child  (2419 month old )   Receives Help From Independent   IADL Independent   Additional Comments Pt. reported BF will provide assist -> unclear if pt. has any other alternatives to assist with IADLs & child care.   Pain Assessment   *Is the patient currently in pain? Yes   Pain Location 1 Ankle   Orientation of Pain 1 Right   Pain Scale 1 9   Pain 1 Before;After;During   Pain Intervention(s) 1 Repositioned;Refer to nursing for pain management   Vision    Current Vision No visual deficits   Cognition   Cognition No deficit noted   Perception   Perception No deficit noted   Sensation   Sensation No apparent deficit   UE Assessment   UE Assessment Full AROM RUE;Full AROM LUE   Bed Mobility   Supine to Sit Modified Independent   Sit to Supine Modified Independent   Functional Transfers   Sit to Stand Modified Independent  (RW)   Toilet Transfers Modified Independent  (Use of grab bar)   Balance   Sitting - Static Independent ;Unsupported   Sitting - Dynamic Independent;Unsupported   Standing - Static Independent  (Mod indp with RW)   Standing - Dynamic (Mod indp with RW)   ADL Assessment   Eating Independent   Grooming Independent   UE Dressing Set up   LE Dressing Set up;Minimal Assist   Assist Needed With: (Simulated pants ->  assist with pants over feet/up lower leg)   Bathing Not Tested  (Pt. reports assist will be available)   Toileting Modified independence   Where Toileting Assessed Standard toilet   Assist needed with Grab bar use;Increased time to complete   Plan   OT Frequency One-time visit   No acute OT needs Pt demonstrates adequate ADL skills for return to prior living environment   Recommendation   OT Discharge Recommendations Prior living environment;Home OT  (Assist with IADLs, as well as child care)   OT Discharge Equipment Recommended 3:1 Commode  (Low toilet at home (needs additional height, as well as use of UE's))         Thank you.  Judithe ModestKushner, Kairo Laubacher Beth OTR/L  Pager:  [phone]        Timed Calculations:  Timed Codes:  0 min  Untimed Codes: 24 min (eval)  Unbilled Time: 0 min  Total Time:  24 min

## 2014-06-23 NOTE — Progress Notes (Signed)
Physical Therapy Discharge Note:      06/23/14 1300   PT Tracking   PT TRACKING PT Discontinue   Visit Number   Visit Number 0   Plan/Recommendation   Treatment Interventions No further PT interventions   PT Frequency none further   Hospital Stay Recommendations Ambulate daily with nursing assist;Out of bed with nursing assist;May be out of bed with family assist;May ambulate with family assist   Discharge Recommendations Anticipate return to prior living arrangement;Home PT   PT Discharge Equipment Recommended Rolling Walker;Wheelchair     Bed Bath & BeyondKelly A. Margo [name], PT, DPT  Pager [phone]

## 2014-06-23 NOTE — Plan of Care (Signed)
Impaired Balance    • Patient will maintain balance to allow for safe mobility and Progressing towards goal        Impaired Mobility (musc)    • Patient's mobility is maintained or improved (musc) Progressing towards goal        Post-Operative Bladder Elimination    • Patient is able to empty bladder or return to baseline Progressing towards goal        Post-Operative Bowel Elimination    • Elimination pattern is normal or improving Progressing towards goal        Post-Operative Complications    • Prevent post-operative complications Progressing towards goal    • Patient will remain free from symptoms of infection-post op Progressing towards goal        Post-Operative Hemodynamic Stability    • Maintain Hemodynamic Stability Progressing towards goal        Potential for impaired Circulation, Motor, or Sensation    • Patient's CMS status is maintained or improved from baseline Progressing towards goal

## 2014-06-23 NOTE — Progress Notes (Signed)
Brief Note - Orthopaedic Surgery    Called to evaluate patient for severe pain at the lateral aspect of right ankle. Per discussion with patient she was "digging" under her splint with a fork. Advised patient to avoid placing any objects under her splint as she can very easily break down the overlying skin leading to multiple complications including infection. Patient verbalized her understanding, and was agreeable to avoiding placing anything under her splint.     Ice, elevate affected extremity  Pain control    Recommend splint change in AM.     Lindalou HoseAlexander Samuel Chey Rachels, MD at 12:41 AM on 06/23/2014  Orthopaedic Surgery Resident  Pager [phone]

## 2014-06-23 NOTE — Plan of Care (Signed)
Impaired Ambulation    • LTG - IMPROVE AMBULATION Completed or Resolved        Impaired Bed Mobility    • STG - IMPROVE BED MOBILITY Completed or Resolved        Impaired Stair Navigation    • LTG - IMPROVE STAIR NAVIGATION Completed or Resolved        Impaired Transfers    • STG - IMPROVE TRANSFERS Completed or Resolved
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
## 2014-06-23 NOTE — Progress Notes (Signed)
Jenna [name]  [id number]  29 y.o.  female          Dressing change: 06/23/2014      RLE splint and dressing removed. Incision intact with staples in place with no drainage or erythema. New dressing and splint reapplied. Patient tolerated well.       Allyne GeeSarah Masiya Claassen, MD  Orthopedic Surgery  06/23/2014  10:35 AM

## 2014-06-24 LAB — BASIC METABOLIC PANEL
Anion Gap: 12 (ref 7–16)
CO2: 26 mmol/L (ref 20–28)
Calcium: 8.7 mg/dL — ABNORMAL LOW (ref 8.8–10.2)
Chloride: 100 mmol/L (ref 96–108)
Creatinine: 1 mg/dL — ABNORMAL HIGH (ref 0.51–0.95)
GFR,Black: 88 *
GFR,Caucasian: 77 *
Glucose: 109 mg/dL — ABNORMAL HIGH (ref 60–99)
Lab: 13 mg/dL (ref 6–20)
Potassium: 4.8 mmol/L (ref 3.3–5.1)
Sodium: 138 mmol/L (ref 133–145)

## 2014-06-24 LAB — CONFIRM OPIATES: Confirm Opiates: POSITIVE

## 2014-06-24 LAB — HEMATOCRIT: Hematocrit: 27 % — ABNORMAL LOW (ref 34–45)

## 2014-06-24 LAB — MCHC: MCHC: 29 g/dL — ABNORMAL LOW (ref 32–36)

## 2014-06-24 MED ORDER — DOCUSATE SODIUM 100 MG PO CAPS *I*
200.0000 mg | ORAL_CAPSULE | Freq: Every evening | ORAL | Status: DC
Start: 2014-06-24 — End: 2014-08-11

## 2014-06-24 MED ORDER — OXYCODONE HCL 5 MG PO TABS *I*
5.0000 mg | ORAL_TABLET | ORAL | Status: DC | PRN
Start: 2014-06-24 — End: 2014-06-24

## 2014-06-24 MED ORDER — OXYCODONE HCL 5 MG PO TABS *I*
5.0000 mg | ORAL_TABLET | ORAL | Status: DC | PRN
Start: 2014-06-24 — End: 2014-08-11

## 2014-06-24 MED ORDER — ENOXAPARIN SODIUM 40 MG/0.4ML IJ SOSY *I*
40.0000 mg | PREFILLED_SYRINGE | Freq: Every day | INTRAMUSCULAR | Status: AC
Start: 2014-06-24 — End: 2014-07-22

## 2014-06-24 MED ORDER — CALCIUM CARBONATE ANTACID 500 MG PO CHEW *I*
1000.0000 mg | CHEWABLE_TABLET | Freq: Two times a day (BID) | ORAL | Status: DC | PRN
Start: 2014-06-24 — End: 2014-08-11

## 2014-06-24 MED ORDER — DOCUSATE SODIUM 100 MG PO CAPS *I*
200.0000 mg | ORAL_CAPSULE | Freq: Every evening | ORAL | Status: DC
Start: 2014-06-24 — End: 2014-06-24

## 2014-06-24 MED ORDER — CALCIUM CARBONATE ANTACID 500 MG PO CHEW *I*
1000.0000 mg | CHEWABLE_TABLET | Freq: Two times a day (BID) | ORAL | Status: DC | PRN
Start: 2014-06-24 — End: 2014-06-24

## 2014-06-24 MED ORDER — ACETAMINOPHEN 500 MG PO TABS *I*
1000.0000 mg | ORAL_TABLET | Freq: Three times a day (TID) | ORAL | Status: DC
Start: 2014-06-24 — End: 2014-08-11

## 2014-06-24 MED ORDER — ENOXAPARIN SODIUM 40 MG/0.4ML IJ SOSY *I*
40.0000 mg | PREFILLED_SYRINGE | Freq: Every day | INTRAMUSCULAR | Status: DC
Start: 2014-06-24 — End: 2014-06-24

## 2014-06-24 MED ORDER — ACETAMINOPHEN 500 MG PO TABS *I*
1000.0000 mg | ORAL_TABLET | Freq: Three times a day (TID) | ORAL | Status: DC
Start: 2014-06-24 — End: 2014-06-24
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
## 2014-06-24 NOTE — Consults (Signed)
Social Work Progress Note:   SW consulted to arrange ride home for pt. SW contacted pt's insurance and obtained approval for pt to be picked [place] [facility] [employer] [facility] [phone]) at 4:00. Pt reportedly told Vallery RidgeKonnie Galvano, Care Coordinator and nursing over the weekend she feels safe with her boyfriend. Team notified.     Invoice: [phone]      Plan:   Pt to discharge home. No further SW needs identified at this time. Please contact SW if any barriers to discharge arise.       Archie BalboaKate Kantowski, LMSW   [phone]/Pager- [phone]

## 2014-06-24 NOTE — Discharge Instructions (Signed)
Call:  Call your Surgeons office promptly if you experience any of these symptoms:  fever > 101.5 F, redness about incision or significant or foul drainage, cold blue toes, numbness or tingling or weakness, chills, pain not relieved by medications.    If you cannot reach your Surgeon, then call the Answering Service (on-call phone number:  [phone]).  If you experience a medical emergency then proceed directly to the Emergency Department.      Activity:  No weight bearing on Right lower extremity.  Elevate affected extremity as needed.  Apply ice to affected extremity as needed.  Do not drive until instructed that you may do so by your physician.    Incision/Wound/Splint:  Keep splint clean and dry.  May shower if keep splint/wound dry.  No tub baths, no pools, no hot tubs.  Staples/Sutures will be removed at your first office visit.  They should not be removed before appointment.    Dr. Andee PolesHumphrey:  appointments [phone], office [phone].      Deep Vein Thrombosis     A deep vein thrombosis (DVT) is a blood clot (thrombus) that develops in a deep vein. Deep vein thrombosis can lead to complications if the clot breaks off and travels in the bloodstream to the lungs.      CAUSES   Some risk factors include:  l Older age, especially over 29 years old.   l Having a history of blood clots. This means you have had one before. Or, it means that someone else in your family has had blood clots. You may have a genetic tendency to form clots.   l Having major or lengthy surgery. This is especially true for an operation on the hip, knee or belly area (abdomen). Hip surgery is particularly high risk.   l Breaking a hip or leg.  l Sitting or lying still for a long time. This includes long distance travel, paralysis, or recovery from an illness or an operation.  l Being over weight (obese).  l Medicines with the female hormone estrogen. This includes birth control pills and hormone replacement therapy.   l  Smoking.    PREVENTION   General preventative advice:  l Exercise the legs regularly as able to while maintaining your activity restrictions.   l Maintain a weight that is appropriate for your height.  l Avoid sitting or lying in bed for long periods of time without moving the legs.  l Women, particularly those over the age of 29, should consider the risks and benefits of taking estrogen medications including birth control pills.  l Do not smoke, especially if you take estrogen medications.   Long distance travel can increase the risk of DVT. You should exercise your legs by walking or by pumping the muscles every hour.   In-hospital prevention:  l Prevention may include medical and non-medical measures.     SEEK MEDICAL CARE IF:   You have unusual bruising or any bleeding problems.   The swelling or pain in your affected arm or leg is not gradually improving.   You anticipate long-distance travel or surgery.    You discover other family members with blood clots. You (or they) may require further testing for inherited diseases or condition.     SEEK IMMEDIATE MEDICAL CARE IF:   You develop chest pain.   You develop severe shortness of breath.   You begin to cough up bloody mucus or phlegm (sputum).   You feel dizzy or faint.  You develop swelling or pain in the leg.   You have breathing problems after traveling.

## 2014-06-24 NOTE — Progress Notes (Signed)
Patient cleared for discharge today. Patient referred to [facility] for home care needs, CC spoke with Odella AquasSue Keenan, RN. CC spoke with patient to determine if she felt safe to discharge back home with her boyfriend. She stated that she will be going to her home:  [address]   [place] [id number]    She said she will be living with her child and her boyfriend and that she has "no concerns about this".     Wheelchair and RW will be delivered from [facility] prior to discharge today.     [facility] to be delivered to bedside.     No further discharge planning indicated at this time.
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
## 2014-06-25 ENCOUNTER — Telehealth: Payer: Self-pay | Admitting: Orthopedic Surgery

## 2014-06-25 ENCOUNTER — Encounter: Payer: Self-pay | Admitting: Gastroenterology

## 2014-06-25 ENCOUNTER — Telehealth: Payer: Self-pay | Admitting: Internal Medicine

## 2014-06-25 NOTE — Telephone Encounter (Signed)
Patient called to state that she needs a form sent up to DSS stating that she is unable to travel so she can submit it to her employer.  Patient states this can be faxed to [phone], and DSS can be reached @ [phone].  Please call patient with any questions @ [phone]

## 2014-06-25 NOTE — Telephone Encounter (Signed)
Dondra SpryGail called back.  She said that pt was taking more than 1 oxycodone more often than every 2 hours.  That's when [facility] reinforced her to incorporate the tylenol in every 8 hrs to help relieve pain in between doses.  She also showed her the proper way to elevate and rewrapped her splint because it was cutting into her skin.  After these were done, she said that the pt expressed verbal understanding of medication and that pain was more comfortable after proper elevation.

## 2014-06-25 NOTE — Telephone Encounter (Signed)
She should not be taking Oxy more frequently or at a higher dose than what is on the bottle. She was managed nicely on that dose in the hospital for 3 days following surgery. She needs to apply ice, elevate and strictly limit her activity to alleviate her pain. Fleet ContrasRachel can you contact the patient to review?

## 2014-06-25 NOTE — Telephone Encounter (Signed)
Dr. Allena KatzPatel,    Please see message below and advise, Thank you

## 2014-06-25 NOTE — Telephone Encounter (Signed)
Patient called stating that [facility] is supposed to visit her home today but she is not sure what time they are supposed to come.  Patient states that they have not come yet.  Please call [phone] to advise.

## 2014-06-25 NOTE — Telephone Encounter (Signed)
Care Management Self-Management Assessment    Date of Discharge Call:  06/25/2014                     Patient Assessment:    Why were you in the hospital?   Fractured leg    Please list several of your concerns following your discharge:  Pain in leg-has called orthopedic office and waiting for call back.   Has not heard from [facility] regarding time of visit today. [facility] nurse called yesterday but did not give time.   Phone number for [facility] office given to patient to call to check time of visit today.     Do you have all of your medications?  yes    Are you on any anticoagulation medication?  yes- lovenox    Have you had any problems with your medications?  no    Do you have any physical limitations right now?  walker    On a scale from 1 to 10 (with 10 being the most stressed your have ever felt in your life) what would you say is your current level of stress? 6    Who are the most important people in your life that provide you support?  Boyfriend    Transportation plan:  Medicaid cab  Refusing SIM appt at this time. Plans to follow up with ortho office visit on 07/05/14.

## 2014-06-25 NOTE — Telephone Encounter (Signed)
Rec'd a phone call from Dondra SpryGail, RN w/ [facility] stating that she opened pt to home care today.  Pain level noted was 7/10.  Dondra SpryGail stated that pt was not taking Oxy as instructed (only 2x/day) and was not incorporating tylenol with it.  She also noted that she was not elevating appropriately and was not icing, but that she is following NWB instructions.  She went over directions for pain meds, icing, and elevation with patient.

## 2014-06-25 NOTE — Telephone Encounter (Signed)
Spoke to pt.  Pt states she got out of the hospital yesterday and is in a great deal of pain.  Pt's chart significant for ORIF of R ankle.  Pt states the oxycodone she was prescribed is "not working", rates her pain 8/10 at the time of this call.  Pt advised to contact her surgical team who ordered the medication to discuss alternate pain management, phone number provided to pt.  Will direct to provider as FYI only.
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
## 2014-06-25 NOTE — Telephone Encounter (Signed)
Review of AVS:    Date of Discharge:    06/24/14    Reason for admission:    Fracture Of Distal End Of Right Fibula      Discharged to :     Home    Follow up with [facility] On 06/25/2014.  Why: [facility] will follow up with you at home and will call to set up visit time  Contact information  [phone]    On Coumadin or Warfarin:  no    Follow up appointment:    07/05/2014 1:15 PM Barbato, Antonietta BarcelonaMegan Kelly, NP [facility]  [phone]    08/06/2014 8:00 AM Nadara EatonMarcus, Carolina, MD [facility]  [phone]    08/06/2014 12:45 PM Bland SpanWilder, Peter, LMSW [facility]  [phone]    Medicare?  no

## 2014-06-25 NOTE — Telephone Encounter (Signed)
Pt called stating that oxycodone 5mg  is not helping.  She wants to know if she can take something else or take it more frequently.  She can be reached at [phone] if you need to speak with her.

## 2014-06-26 ENCOUNTER — Telehealth: Payer: Self-pay | Admitting: Internal Medicine

## 2014-06-26 NOTE — Telephone Encounter (Signed)
Given, Tonny Branchachel M, [place]  Cristino MartesWilson, Finnleigh Marchetti A      Caller: Unspecified (Yesterday, 12:05 PM)                Returned call to patient.

## 2014-06-26 NOTE — Telephone Encounter (Signed)
Patient was contacted by our office (PA Fleet ContrasRachel Given) and instructed to take medications at doses that were effective in the hospital.  We cannot increase narcotics at this post op stage if her pain was previously adequately controlled.  Patient acknowledges noncompliance with activity limitations at home and thus was advised that it is unsafe to increase narcotics to facilitate noncompliance with the treatment plan.

## 2014-06-26 NOTE — Telephone Encounter (Signed)
(  encounter 4.12)  Patient calling back to request to speak with a Nurse or Doctor regarding her extreme leg pain.  She is requesting medication.  Please call her back at [phone].

## 2014-06-26 NOTE — Telephone Encounter (Signed)
Spoke to pt.  Pt states she is still experiencing severe pain in her right ankle r/t ORIF.  Advised pt again that the pain management for her surgical injury would have to come from Ortho.  Pt verbalized frustration, stating Ortho is not returning her calls.  Pt states she has been icing and elevating her right ankle, and has been trying to stay off it, "but I have a 4523-month old child at home."  Pt suggested "Well, what if it was my whole body that was aching?"  This writer advised pt against deceptive reporting of symptoms.  Will direct to Ortho team for review and follow-up.

## 2014-06-26 NOTE — Telephone Encounter (Signed)
Returned patient's call. Spoke about why we can't increase her narcotics. Originally, was informed that patient had been non-compliant with treatment plan, including resting and not taking pain medication as prescribed. When questioned, she states that she is resting, but then later stated that she had to take care of her 6919 month old child and did not have a lot of help. Reinforced the importance of taking the medication as prescribed, icing/elevating the leg, and rest.

## 2014-07-01 ENCOUNTER — Telehealth: Payer: Self-pay | Admitting: Orthopedic Surgery

## 2014-07-01 NOTE — Telephone Encounter (Signed)
rec'd phone call from [employer] stating that PT has been approved for patient.  10 visits w/ auth #:  [id number]:  [id number], Valid:  06/25/14 to 03/15/15

## 2014-07-03 ENCOUNTER — Encounter: Payer: Self-pay | Admitting: Internal Medicine

## 2014-07-03 ENCOUNTER — Telehealth: Payer: Self-pay | Admitting: Internal Medicine

## 2014-07-03 NOTE — Telephone Encounter (Signed)
Patient called stating that she is breaking out in hives all over her body.  Patient states she is itching and scratching.  Please call her back @ [phone]

## 2014-07-03 NOTE — Telephone Encounter (Signed)
Spoke with Jenna [name] and she needs a letter due to recent surgery. Letter is in and will be faxed to DSS.     Jenna Grimhwani Kyli Sorter, MD on 07/03/2014 at 1:39 PM  PGY2 - Internal Medicine  Pager [phone]

## 2014-07-03 NOTE — Telephone Encounter (Signed)
Spoke to patient and advised her that her letter is completed.  Patient asked that we fax the information.    I advised her that without a ROI I can not fax this.   Patient stated she can not make it up here today and that  She is giving out office a verbal to fax it.  Patient stated " I am giving a verbal to fax my 4/20 letter stating   That I can not travel to my DSS." patient said this to Clinical research associatewriter  And [facility] our pharmacist.

## 2014-07-03 NOTE — Telephone Encounter (Signed)
Left message for patient to call back.  Call back number provided.

## 2014-07-03 NOTE — Telephone Encounter (Signed)
Patient called for status update of her request.  She said she needs this information today.    Please call her at [phone]
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
## 2014-07-04 ENCOUNTER — Telehealth: Payer: Self-pay | Admitting: Internal Medicine

## 2014-07-04 ENCOUNTER — Other Ambulatory Visit: Payer: Self-pay | Admitting: Surgery

## 2014-07-04 DIAGNOSIS — Z09 Encounter for follow-up examination after completed treatment for conditions other than malignant neoplasm: Secondary | ICD-10-CM
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
## 2014-07-04 NOTE — Telephone Encounter (Signed)
Left message for patient that Dr. Allena KatzPatel tried to call her earlier today and would try again later after she was done seeing patients.

## 2014-07-04 NOTE — Telephone Encounter (Signed)
Jenna [name] is calling because she did not hear from Dr. Allena KatzPatel yesterday after she spoke to Lavera GuiseMary Bonds, RN.  She is having intense itching all over her body from head to toe and needs to speak to someone ASAP.  Please call her at [phone].

## 2014-07-04 NOTE — Telephone Encounter (Signed)
Jenna [name], with medicaid transportation, calling to inquire if patient really has a broken leg.  Call was lost.

## 2014-07-04 NOTE — Telephone Encounter (Signed)
Spoke with boyfriend, Morrie Sheldonshley is in the ED    Tobey Grimhwani Idalis Hoelting, MD on 07/04/2014 at 6:21 PM  PGY2 - Internal Medicine  Pager [phone]

## 2014-07-05 ENCOUNTER — Encounter: Payer: Self-pay | Admitting: Surgery

## 2014-07-05 ENCOUNTER — Telehealth: Payer: Self-pay | Admitting: Orthopedic Surgery

## 2014-07-05 ENCOUNTER — Ambulatory Visit: Payer: Self-pay

## 2014-07-05 ENCOUNTER — Encounter: Payer: Self-pay | Admitting: Gastroenterology

## 2014-07-05 ENCOUNTER — Ambulatory Visit: Payer: Self-pay | Admitting: Surgery

## 2014-07-05 VITALS — BP 124/68 | Ht 65.0 in | Wt 300.0 lb

## 2014-07-05 DIAGNOSIS — S82831A Other fracture of upper and lower end of right fibula, initial encounter for closed fracture: Secondary | ICD-10-CM

## 2014-07-05 DIAGNOSIS — S82491D Other fracture of shaft of right fibula, subsequent encounter for closed fracture with routine healing: Secondary | ICD-10-CM

## 2014-07-05 NOTE — Progress Notes (Signed)
S/P Open reduction, internal fixation, right ankle 06/21/14.  Despite recommendations to remain nonweightbearing she has been weightbearing without any assistive devices.  She removed her splint last week.  [facility] she states due to an allergic reaction they placed her in a cast which she took off with a hammer last evening.  She has been taking oxycodone more than prescribed she states because she has been caring for her 4 children and her pain has increased.  She is requesting a refill today.  Denies fevers or chills. No other concerns today.    Exam: Incision C/D/I with staples. No erythema or drainage. Calf is soft.    Xrays: demonstrate acceptable alignment of fracture. All hardware  Remains in position.    Plan: staples removed.  Placed in a high tide boot she can remove 3 times a day for gentle ankle range of motion exercises.  Instructed on the importance of nonweightbearing on the right lower extremity to avoid hardware failure and the fracture shifting requiring further surgery.  Per Dr. Andee PolesHumphrey we will no longer prescribe opioids due to her noncompliance.  She may take ibuprofen or Tylenol as prescribed as needed.  FU in 4 weeks.  I am ordering right ankle x-rays. Patient agrees with plan.  Will FU sooner with any new concerns or complaints.    This document was dictated with Conservation officer, historic buildingsDragon voice recognition software. Please excuse any errors as the document was typeread to the best of my ability.

## 2014-07-05 NOTE — Telephone Encounter (Signed)
Attempted to contact patient about narcotics. Her phone rang but did not have an option to leave a voice message.

## 2014-07-06 ENCOUNTER — Telehealth: Payer: Self-pay | Admitting: Internal Medicine

## 2014-07-06 NOTE — Telephone Encounter (Signed)
[facility] - Telephone Encounter.    I returned Ms. Bellizzi's call at [phone] in regards to right ankle pain.  I reached her VM.  I also tried her mobile phone [phone]), though she was not available.     [facility], underwent ORIF of right ankle on 06/21/14.     I have tried to reach Ms. Katrinka BlazingSmith several times throughout the course of the day, continue to reach her VM. I left message requesting call back to discuss her right ankle pain.        Griffin Dakinaniel Nailyn Dearinger, MD on 07/06/2014 at 10:04 AM  PGY3 - Internal Medicine  Pager [phone]

## 2014-07-06 NOTE — Telephone Encounter (Signed)
Spoke with Cayden, reports pain in R.ankle, recent surgery.  Using tylenol and ibuprofen.   She saw orthopedics who recommended calling PCP for pain management.   Advised to continue ibuprofen and tylenol, unable to prescribe until seen in office, Bet[facility] agreeable to [facility].     Tobey Grimhwani Shahana Capes, MD on 07/06/2014 at 6:54 PM  PGY2 - Internal Medicine  Pager [phone]

## 2014-07-08 ENCOUNTER — Telehealth: Payer: Self-pay | Admitting: Internal Medicine

## 2014-07-08 NOTE — Telephone Encounter (Signed)
Dr. Allena KatzPatel,    Please see message below and advise- thank you

## 2014-07-08 NOTE — Telephone Encounter (Signed)
Jenna [name] is calling to request a revised letter to be faxed to DSS pertaining to her restrictions and not being able to go to the hearing.  See 4/12 telephone encounter for details.  The letter was faxed on 4/20 but DSS needs the following information added to it:  "Jenna [name] can't travel because she can't put weight on her leg & is on bedrest for 6-8 weeks"  fax to [phone]  hearing number [phone] R needs to be at top of fax.  DSS needs this ASAP.  Jenna [name] can be reached at [phone] for any questions.

## 2014-07-08 NOTE — Progress Notes (Signed)
Walk-in Visit    Patient name: Jenna [name]     Pain    07/08/14 [id number]   PainSc:   8         The Patient was provided with the following items:  Orthosis:  Side:: RT  Size:: MD  Model:: Maxtrax High Boot  Manufacturer: [employer]  Part Number: [id number]  Warranty:: 90 Days    Functional Goals: Immobilization/Reduce joint motion  Provide joint stability    ROM settings: N/A    Expected Frequency / Duration of Use:   Daily    Modifications made: No modifications were required at this time    Complexity:   Fitting was complex, requiring professional expertise to fit and or adjust.  The following conditions led to need for professional expertise to fit:  Device complexity    Yes, Device was inspected for safety/security and found to be functioning properly    Ordered/Delivered: Device Delivered    The patient given verbal and written instructions.  The following Instructions were reviewed: Purpose of device, Cleaning / Care of device, Potential Risks / Benefits, Frequency / Duration of use and How to report potential failure / malfunctions.    Device Comfort Score: 10

## 2014-07-08 NOTE — Telephone Encounter (Signed)
Duplicate encounter- please see 4/23 encounter

## 2014-07-08 NOTE — Telephone Encounter (Signed)
Left VM for patient to contact us back in regards to making an appointment.  If patient calls back please schedule her an appointment.

## 2014-07-08 NOTE — Patient Instructions (Signed)
[facility]      Your physician has determined that you require Prefabricated (off-the-shelf) Orthotic and Prosthetic (O&P) devices and/or Durable Medical Equipment (DME).  O&P devices include items such as braces for the spine or limbs, while DME includes items such as canes, crutches and walkers.  For your convenience you were fitted by the [facility].    Return Policy:    • Prefabricated O&P and DME items are not returnable if used outside of clinic due to hygiene concerns.    • Special or custom orders are not returnable or refundable.    • O&P devices and DME purchased from the [facility] may only be exchanged if the item is faulty or poorly fitting, as determined by the O&P Department Clinical Coordinator.  Exchanges will be made using the same type of device, if within 7 days of the purchase date.    • No exchange will be issued for items that were not used in accordance with the manufacturer’s recommended standard of care.    • Replacement or repair of minor parts could become necessary due to wear.  This may include a charge and an order from you physician may be required.  [employer]

## 2014-07-09 ENCOUNTER — Telehealth: Payer: Self-pay | Admitting: Internal Medicine

## 2014-07-09 NOTE — Telephone Encounter (Signed)
(  encounter 4.25)  Patient calling to advise that the letter for DSS needs to state how long she is bedridden for.  Patient can be reached at [phone] with any questions.

## 2014-07-09 NOTE — Telephone Encounter (Signed)
Dr. Allena KatzPatel,    Please see message below and advise in regards to a letter for DSS- I see you wrote a letter yesterday in regards to this and that she should not put weight on it for about 6 weeks,  But I do not see anything about her being bedridden? Please advise.

## 2014-07-10 ENCOUNTER — Telehealth: Payer: Self-pay | Admitting: Internal Medicine

## 2014-07-10 ENCOUNTER — Ambulatory Visit: Payer: Self-pay | Admitting: Internal Medicine

## 2014-07-10 ENCOUNTER — Telehealth: Payer: Self-pay | Admitting: Cardiovascular Disease

## 2014-07-10 VITALS — BP 126/82 | HR 88 | Temp 97.7°F | Ht 65.0 in | Wt 300.0 lb

## 2014-07-10 DIAGNOSIS — T148XXA Other injury of unspecified body region, initial encounter: Secondary | ICD-10-CM

## 2014-07-10 NOTE — Telephone Encounter (Signed)
Patient calling stating she was seen in urgent care.  Patient was informed by urgent care the do not prescribe narcotics and she will need to contact PCP. Patient calling to get pain medication. Patient would like a call back at [phone].

## 2014-07-10 NOTE — Telephone Encounter (Signed)
Patient has appointment with Urgent Care 07/10/2014
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
## 2014-07-10 NOTE — Telephone Encounter (Signed)
Left message for pt to return call.

## 2014-07-10 NOTE — Patient Instructions (Addendum)
Do not exceed more than 3 grams of tylenol per day. That means do not take more than 9 tablets of the tylenol 325 mg.   You can alternate the tylenol with the ibuprofen.   Try to remain non-weight bearing as much as possible.   Unfortunately we cannot prescribe narcotics in urgent care. We will send a message to your primary care doctor, Dr. Allena KatzPatel, to let her know what's going on.

## 2014-07-10 NOTE — Telephone Encounter (Signed)
Patient was called back regarding ankle pain. She is demanding narcotics stating we are not helping with her ankle pain. She is very frustrated that she was not given any narcotics at her appointment with urgent care within the last few hours. She was told she needs to be non-weight bearing as per the orthopedic surgery instructions. She yelled "are you going to help me with my kids" and explained she cannot follow those instructions. I told her unfortunately I cannot do anything more than reiterate what she was told just a few hours ago in urgent care clinic. I emphasized that she may use appropriate doses of tylenol (no more than 3 grams) and ibuprofen as needed. She is very upset and asked to speak with my supervisor. I told her she may call back tomorrow during business hours if she wants to do so. She had no further questions but was very upset.      Signed by Vergie LivingSumeet K Arlynn Stare, MD as of: 07/10/2014  at: 5:55 PM  Internal Medicine PGY-2  Pager: [phone]

## 2014-07-11 NOTE — Telephone Encounter (Signed)
Another phone call to pt to offer appt   No answer, message left to return call to office   See note from Dr. Jinny SandersLall, on call

## 2014-07-12 NOTE — Telephone Encounter (Signed)
She is not bed ridden so cannot change letter for DSS.  If she has concerns, she should address with Ortho who did surgery.  Thanks

## 2014-07-15 NOTE — Telephone Encounter (Signed)
Attempted to phone patient and received message stating "person cannot accept calls at this time" and phone disconnected

## 2014-07-16 ENCOUNTER — Telehealth: Payer: Self-pay | Admitting: Orthopedic Surgery

## 2014-07-16 NOTE — Telephone Encounter (Signed)
Attempted another call to patient and received same message.    If patient calls again regarding letter for DSS, please advise her to contact Ortho MD office.    Patient is not bed-ridden

## 2014-07-16 NOTE — Telephone Encounter (Signed)
Called pt on 07/16/14 at:  [phone] and rec'd a msg stating that the number is "not currently accepting calls."      A 2nd phone call attempt was made on 07/16/14 at:  [phone].  A woman answered who stated that "Morrie Sheldonshley is unavailable at this time."  I asked if there was another number she could be reached and she stated "no" since the 1st # is not accepting calls.  She asked if there was something she could help with.  I informed her that I was calling from Dr Humphrey's office in regards to changing pt's appt time on Thursday, 5/12 and if she can call the office back to reschedule the time.  She said "Okay."  I asked her if she needed the number and she said "No, I know it.  It's [phone]" (which is [facility]).      Appt for 5/12 @ 3:50pm has been changed to 5/12 @ 2:50pm.

## 2014-07-16 NOTE — Telephone Encounter (Signed)
Rec'd call from [place] PT @ [facility] stating that pt has been d/c from In Home PT

## 2014-07-23 ENCOUNTER — Telehealth: Payer: Self-pay

## 2014-07-23 NOTE — Telephone Encounter (Signed)
Unable to reach patient to confirm INTAKE appointment on 08/06/14 with Bland SpanPeter Wilder due to patients phone number being invalid.

## 2014-07-25 ENCOUNTER — Ambulatory Visit: Payer: Self-pay | Admitting: Orthopedic Surgery

## 2014-07-26 NOTE — Telephone Encounter (Signed)
Pt did not show for appt w/ [facility] on 07/25/14

## 2014-08-01 ENCOUNTER — Telehealth: Payer: Self-pay

## 2014-08-01 NOTE — Telephone Encounter (Signed)
SPOKE WITH FEMALE TO CONF TUES 5-24  8:00 AM

## 2014-08-06 ENCOUNTER — Ambulatory Visit: Payer: Self-pay | Admitting: Sleep Medicine

## 2014-08-07 NOTE — Telephone Encounter (Signed)
Patient did not attend.

## 2014-08-10 ENCOUNTER — Encounter: Payer: Self-pay | Admitting: Student in an Organized Health Care Education/Training Program

## 2014-08-10 ENCOUNTER — Emergency Department
Admission: EM | Admit: 2014-08-10 | Disposition: A | Discharge status: Home / Self Care | Payer: Self-pay | Source: Ambulatory Visit | Attending: Psychiatry | Admitting: Psychiatry

## 2014-08-10 LAB — HM HIV SCREENING OFFERED
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
## 2014-08-10 NOTE — ED Provider Progress Notes (Signed)
ED Provider Progress Note    Pt more alert and oriented in ED. Pt states she was drinking alcohol and smoking crack/cocaine in an attempt to kill herself. Pt discussed with CPEP team who agree to admit pt for further treatment and evaluation. Pt understands and agrees with plan.       Jairo BenMandeep Kaden Dunkel, MD, 08/10/2014, 11:28 PM        Jairo BenLehil, Tangi Shroff, MD  Resident  08/10/14 [phone]

## 2014-08-10 NOTE — ED Notes (Signed)
Pt given sandwich and juice. Tolerated well.

## 2014-08-10 NOTE — ED Notes (Signed)
Pt cont to eat and drink with no difficulty.
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
## 2014-08-10 NOTE — ED Notes (Signed)
Pt ambulated to bathroom with no difficulty. Pt given pads and underwear. Given more ginger ale.
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
## 2014-08-11 ENCOUNTER — Emergency Department
Admission: EM | Admit: 2014-08-11 | Disposition: A | Discharge status: Home / Self Care | Payer: Self-pay | Source: Ambulatory Visit | Attending: Emergency Medicine | Admitting: Emergency Medicine

## 2014-08-11 LAB — ETHANOL: Ethanol: <10 mg/dL

## 2014-08-11 MED ORDER — IBUPROFEN 200 MG PO TABS *I*
800.0000 mg | ORAL_TABLET | Freq: Once | ORAL | Status: AC
Start: 2014-08-11 — End: 2014-08-11
  Administered 2014-08-11: 800 mg via ORAL
  Filled 2014-08-11: qty 4

## 2014-08-11 MED ORDER — IBUPROFEN 400 MG PO TABS *I*
600.0000 mg | ORAL_TABLET | Freq: Once | ORAL | Status: AC
Start: 2014-08-11 — End: 2014-08-11
  Administered 2014-08-11: 600 mg via ORAL
  Filled 2014-08-11 (×2): qty 1
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
## 2014-08-11 NOTE — CPEP Notes (Signed)
Clinical Evaluator Progress Note    Writer completed a safety plan with pt.     Collateral Contact    Addison BaileyKaream Bynum (boyfriend) [phone]: Writer attempted the phone number for pt's boyfriend used when she presented to CPEP on 11/26/13. The number rang multiple times but there was no answer and no option to leave a voicemail.    Maurene CapesLakisha Martion [place](Aunt) [phone]: Alvy BealLakisha reported the following information. Alvy BealLakisha had no immediate safety concerns and is comfortable with discharge. Alvy BealLakisha reported that she last spoke to pt two days ago and during that conversation pt seemed happy. To Likisha's knowledge pt has not voiced SI recently. Pt has no history of SI, SA and psychiatric hospitalizations. Alvy BealLakisha does not suspect that pt is using ETOH or other substances.

## 2014-08-11 NOTE — ED Notes (Signed)
08/11/14 0016   Admission   CPEP Legal Status on Arrival Voluntary   *Mode of Arrival Ambulatory   First Contact 0016   Admitting Procedure   *Belonging Search Yes   *Patient Checked for Contraband Body Scanned with Wand;Clothing Checked;Belongings Checked   *Oriented to Unit Yes   15 Minute Checks Maintained Yes   Belongings   Dentures None   Vision - Corrective Lenses None   Hearing Aid/Cochlear Implant None   Jewelry None   Clothing With Patient;Clothing Room/Cupboard  (shirt, sweatshirt, NO SHOES)   Other Valuables None   Prosthesis / Assistive Devices None   Valuables Given To None   Cashier's Office No
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
## 2014-08-11 NOTE — ED Notes (Signed)
Patient to xray.
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
## 2014-08-11 NOTE — ED Notes (Signed)
US

## 2014-08-11 NOTE — ED Notes (Signed)
Seen here last night or intox and cocaine use. Just discharged from CPEP and referred back down to med ED to r/o DVT.
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
## 2014-08-11 NOTE — Comprehensive Assessment (Signed)
08/11/14 1710   Disposition details   Patient is being other (comment)  (transferred back to medical ED)   Family notification done via phone   Outpatient provider notification of disposition patient has no outpatient provider   Living situation: notification of disposition not done-unable to reach   Transportation arranged other (comment);via bus-passes given  (pt transferred back to medical ED but given bus passes)   Patient belongings  given to patient   Medications NA- patient has none   Valuables NA- patient has none

## 2014-08-11 NOTE — Discharge Instructions (Signed)
CPEP Discharge Instructions    Discharge Date: 08/11/2014    Discharge Time: 5:15 PM    Follow-ups:    The contact number for [facility] after hours is [phone].    Adult Comprehensive Outpatient Psychiatric Services - Walk-In Options    Instructions: Please contact ONE of the providers listed below and inform them you were discharged from CPEP and needs a COPS/Open Access Appointment to begin the intake process.  Please plan your time accordingly.  Average intake time is 2 hours to complete.    Please bring your insurance information to the intake appointment.    [facility]  [address]  [place], [place] [id number]  Phone:  [phone]  WALK IN HOURS: Tuesdays, Wednesdays, and Thursdays from [facility]9am - 11am     [facility]  [address]  [place], [place] [id number]  T:  [phone]  WALK IN HOURS:  Monday - Thursday 9am - 3pm and Friday 9am - 12pm    [facility]  [address]  [place], [place] [id number]  Phone:  [phone]  WALK IN HOURS: Thursday mornings 8:00am - 9:30am    Chemical Dependency Resources     How can I tell if I have an alcohol problem?    Alcohol may be harmful for you if it causes a problem in any part of your life. Following are signs that you may have a drinking problem or are alcohol dependent.     Blacking out or forgetting where you were or what you were doing   Drinking to get drunk. Or, feeling like you need to drink more to get the same feeling or "buzz"   Drinking to decrease pain or stress   Drinking more than you had expected to drink   Drinking in a pattern. For example, every day or every week at the same time   Suffering from the effects of alcohol on your daily function   Drinking starts to take over and causes problems in your daily life, such as not showing up for work or driving when you are drunk   Trying to hide how much you drink  Feeling guilty or  angry when someone says something about your drinking   Seeing or hearing things that are not there (hallucinating)   Having major personality changes when you drink   Planning activities around drinking   Experiencing seizures (convulsions)   Shaking of your hands if you have not had a drink for a while   Sleeping problems or bad dreams   Sweating, nervousness, confusion, or depression   Thinking a lot about drinking   Trouble having erections     Whats the harm?    Not all drinking is harmful. You may have heard that regular light to moderate drinking  (from  drink a day up to 1 drink a day for women and 2 for men) can even be good  for the heart. With at-risk or heavy drinking, however, any potential benefits are  outweighed by greater risks.    Injuries. Drinking too much increases your chances of being injured or even killed.  Alcohol is a factor, for example, in about 60% of fatal burn injuries, drownings, and  homicides; 50% of severe trauma injuries and sexual assaults; and 40% of fatal  motor vehicle crashes, suicides, and fatal falls.    Health problems. Heavy drinkers have a  greater risk of liver disease, heart disease,  sleep disorders, depression, stroke, bleeding from the stomach, sexually transmitted  infections from unsafe sex, and several types of cancer. They may also have problems  managing diabetes, high blood pressure, and other conditions.    Birth defects. Drinking during pregnancy can cause brain damage and other serious  problems in the baby. Because it is not yet known whether any amount of alcohol is  safe for a developing baby, women who are pregnant or may become pregnant should  not drink.    Alcohol use disorders. Generally known as alcoholism and alcohol abuse, alcohol  use disorders are medical conditions that doctors can diagnose when a patients  drinking causes distress or harm. In the [place], about 18 million people have  an alcohol use disorder.       Wellness  Hints:     Be honest and open about your alcohol use with your family, close friends, and health care professionals. Ask for and accept their help.   Avoid persons who use and/or abuse alcohol and who try to get you to drink alcohol.   Get the help of a counselor and keep regular appointments.   Eat a normal, well-balanced diet, drink six to eight glasses of water a day, and get plenty of rest.   Replace social activities associated with drinking alcohol with those that do not involve alcohol.   Consider cutting down on the amount of alcohol you drink and how often you drink it.    Resources for Drug and/or Alcohol Treatment   [email or web address]                  DETOX FACILITIES:    Engineering geologist Addiction Services Economist)  [address] (6th floor) [place] [address] [phone]     [facility] Crisis [facility]   [address] [place] [address] [phone]     [facility] (VA or Straight Medicaid only)  [address] [place] [address] [phone]     [facility], Alcohol Only)  [address] [place] [address] [phone]   [facility] - Out[facility] Only, Alcohol Only)  [address] [place] [address] [phone]   [facility]  [address] [place] [address] [phone]       INPATIENT PROGRAMS:    [facility]   [address] [place] [address] [address] [facility] [address]) [phone]   Katherine Roan  [address] [place] [address] [phone] option 3     [facility]  [address] [place] [address]  (Facility is affiliated with [employer]. Referral must be sent to [facility] who will then forward information to this location.) [phone]     [facility] [place] [facility])  [address] [place] [address] [phone]     [facility]) - [facility]-   [address] [place] [address]  Facility is  affiliated with [facility]. Referral must be sent to [facility] who will then forward information to this location.) [phone]     [facility]   [address] [place] [address] (located on [facility]) [phone]      [facility])  [address] [place] [address] [phone]      Holiday representative ([place])   [address] [place]  [address] [phone]           OUTPATIENT PROGRAMS:    [facility] - Comprehensive Alcoholism  [address] [place] [address] [phone]   [facility] (Rest[facility])  [address] [place] [address] [phone]     [facility] Outpatient Chemical Dependency  [address] [place] [address] [address])  [address] [place] [address] [phone]  [phone]     [facility] (Monday- Thursday)  [address] [place] [address] [phone] ext [phone]  [address] [place] [place] [address]  [address] [place] [address] (Spanish speaking available)  [phone] [phone]  (indicate location when calling main intake line)   Melissa Noon (Hispanic Outreach) - [facility] Restart  [address] [place] [address] [phone]     [facility])  [address] [place] [address] [phone]     [facility])  [address] [place] [address] [phone]     [facility]  [address] [place] [address] [phone]     [employer]  Dependency   [address] [place] [address] [place] [address]   [address] [place] [address]    [address] [place] [address] Jenelle Mages)  [phone]  (indicate location when calling main intake line)   [facility]  [address] [place] [address] [phone]         ADDITIONAL RESOURCES:    Al-Anon/Al-Teen [phone]   Alcohol Abuse & Addiction Information [phone]    Alcohol Anonymous [phone]   Drug Abuse Information Line [phone]   Drug and Alcohol Council [phone]   LifeLine (603)182-5365 or [address] Council on Alcohol & Substance Abuse Lenor Derrick) [phone]   Narcotics Anonymous Hotline 6814708247   [facility] [phone]       VETERANS ADMINISTRATION:    [place]  [address] [place] [address] [phone]      [facility] No CD Treatment   [place]  [address] [place] [address] [phone]      [place]  [address] [place] [address] [phone]      [place]  [address] [place] [address] [phone]      [facility]  [address] [place] [address]  *only offers intake appointment if veteran is unable to be seen at another facility. Will refer out for treatment after intake is completed.  [phone]     [facility] No CD treatment    [facility]   [address] [place] [address] [phone]           Level of outreach indicated if patient fails new intake or COPS (Comprehensive Outpatient Psychiatric Service) appointment: Routine Program Follow-up    When to call for help:    Call your psychiatric outpatient provider if experiencing any of these symptoms: increased irritability, sleep changes, appetite changes, energy changes, thoughts to harm yourself or others, anxiety, fear, auditory or visual hallucinations.  Lifeline Helpline (24 hours/7 days) [phone] Consulting civil engineer)  Mobile Crisis team: [phone]    General Instructions:  Other written information given to the patient: No  Return to Work/School on: N/A  The above information has been discussed with me and I have received a copy.  I understand that I am advised to follow the instructions given to me to appropriately care for my condition.

## 2014-08-11 NOTE — ED Notes (Signed)
Pt slept through majority of the night, NAD. Will continue to monitor. Safety checks maintained.

## 2014-08-11 NOTE — ED Notes (Signed)
Assumed care of patient at this time. Patient denies any needs or complaints at this time. Bed in low and locked position, call light within reach. Will continue to monitor patient and treat per provider order.

## 2014-08-12 ENCOUNTER — Encounter: Payer: Self-pay | Admitting: Emergency Medicine
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
## 2014-08-12 NOTE — ED Notes (Signed)
Discharge instructions and follow up care discussed with patient. Patient verbalized understanding. Belongings with patient. Patient provided with medicab form for transportation home.
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
## 2014-08-12 NOTE — ED Notes (Signed)
Patient out of bed ambulating to bathroom independently with steady gait.

## 2014-08-15 ENCOUNTER — Other Ambulatory Visit: Payer: Self-pay

## 2014-08-15 NOTE — Comprehensive Assessment (Signed)
08/15/14 1607   DSRIP intervention status   DSRIP Intervention status Initial intervention   DSRIP Patient? Yes   Patient address confirmation No   Patient phone confirmation Yes   Patient insurance confirmation No   Patient FCM referred? No   Patient PCP confirmation No   Time Spent with Patient (min) 5   CHW called patient to make follow up appointment with a PCP.  SW left message and have not heard back from patient at this time.  [facility] Rashmi Tallent  [employer] Worker   [phone]

## 2014-08-22 ENCOUNTER — Ambulatory Visit: Payer: Self-pay | Admitting: Orthopedic Surgery

## 2014-08-27 ENCOUNTER — Encounter: Payer: Self-pay | Admitting: Orthopedic Surgery

## 2014-09-11 ENCOUNTER — Other Ambulatory Visit: Payer: Self-pay | Admitting: Orthopedic Surgery

## 2014-09-11 DIAGNOSIS — S82843A Displaced bimalleolar fracture of unspecified lower leg, initial encounter for closed fracture: Secondary | ICD-10-CM

## 2014-09-12 ENCOUNTER — Ambulatory Visit: Payer: Self-pay | Admitting: Orthopedic Surgery

## 2014-09-18 ENCOUNTER — Encounter: Payer: Self-pay | Admitting: Orthopedic Surgery

## 2014-10-12 ENCOUNTER — Encounter: Payer: Self-pay | Admitting: Emergency Medicine

## 2014-10-12 ENCOUNTER — Emergency Department
Admission: EM | Admit: 2014-10-12 | Disposition: A | Discharge status: Home / Self Care | Payer: Self-pay | Source: Ambulatory Visit | Attending: Emergency Medicine | Admitting: Emergency Medicine

## 2014-10-12 LAB — RUQ PANEL (ED ONLY)
ALT: 17 U/L (ref 0–35)
AST: 20 U/L (ref 0–35)
Albumin: 4 g/dL (ref 3.5–5.2)
Alk Phos: 81 U/L (ref 35–105)
Amylase: 59 U/L (ref 28–100)
Bilirubin,Direct: <0.2 mg/dL (ref 0.0–0.3)
Bilirubin,Total: <0.2 mg/dL (ref 0.0–1.2)
Lipase: 27 U/L (ref 13–60)
Total Protein: 6.8 g/dL (ref 6.3–7.7)

## 2014-10-12 LAB — CBC AND DIFFERENTIAL
Baso # K/uL: 0.1 10*3/uL (ref 0.0–0.1)
Basophil %: 0.7 %
Eos # K/uL: 0.5 10*3/uL — ABNORMAL HIGH (ref 0.0–0.4)
Eosinophil %: 6.4 %
Hematocrit: 28 % — ABNORMAL LOW (ref 34–45)
Hemoglobin: 7.7 g/dL — ABNORMAL LOW (ref 11.2–15.7)
IMM Granulocytes #: 0 10*3/uL (ref 0.0–0.1)
IMM Granulocytes: 0.2 %
Lymph # K/uL: 2.8 10*3/uL (ref 1.2–3.7)
Lymphocyte %: 33.3 %
MCH: 22 pg/cell — ABNORMAL LOW (ref 26–32)
MCHC: 28 g/dL — ABNORMAL LOW (ref 32–36)
MCV: 78 fL — ABNORMAL LOW (ref 79–95)
Mono # K/uL: 0.7 10*3/uL (ref 0.2–0.9)
Monocyte %: 8.8 %
Neut # K/uL: 4.2 10*3/uL (ref 1.6–6.1)
Nucl RBC # K/uL: 0 10*3/uL (ref 0.0–0.0)
Nucl RBC %: 0 /100 WBC (ref 0.0–0.2)
Platelets: 351 10*3/uL (ref 160–370)
RBC: 3.6 MIL/uL — ABNORMAL LOW (ref 3.9–5.2)
RDW: 17 % — ABNORMAL HIGH (ref 11.7–14.4)
Seg Neut %: 50.6 %
WBC: 8.4 10*3/uL (ref 4.0–10.0)

## 2014-10-12 LAB — PROGESTERONE: Progesterone: 11.4 ng/mL

## 2014-10-12 LAB — PLASMA PROF 7 (ED ONLY)
Anion Gap,PL: 15 (ref 7–16)
CO2,Plasma: 23 mmol/L (ref 20–28)
Chloride,Plasma: 105 mmol/L (ref 96–108)
Creatinine: 1.07 mg/dL — ABNORMAL HIGH (ref 0.51–0.95)
GFR,Black: 81 *
GFR,Caucasian: 70 *
Glucose,Plasma: 101 mg/dL — ABNORMAL HIGH (ref 60–99)
Potassium,Plasma: 3.9 mmol/L (ref 3.4–4.7)
Sodium,Plasma: 143 mmol/L (ref 133–145)
UN,Plasma: 24 mg/dL — ABNORMAL HIGH (ref 6–20)

## 2014-10-12 LAB — BHCG, QUANT PREGNANCY: BHCG, QUANT PREGNANCY: <1 m[IU]/mL (ref 0–1)

## 2014-10-12 MED ORDER — OXYCODONE-ACETAMINOPHEN 5-325 MG PO TABS *I*
2.0000 | ORAL_TABLET | Freq: Once | ORAL | Status: AC
Start: 2014-10-13 — End: 2014-10-13
  Administered 2014-10-13: 2 via ORAL
  Filled 2014-10-12: qty 2

## 2014-10-12 NOTE — ED Triage Notes (Signed)
Triage Note   C/o abd pain x 2 days, denies N/V/D    Luster Landsberg, RN

## 2014-10-12 NOTE — ED Notes (Signed)
Pt reminded on several occasions of the need for a urine sample. Continues to sleep bedside.
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
## 2014-10-12 NOTE — First Provider Contact (Signed)
ED Medical Screening Exam Note    Initial provider evaluation performed by   ED First Provider Contact     Date/Time Event User Comments    10/12/14 1955 ED Provider First Contact Wilfred Lacy Initial Face to Face Provider Contact          Vital signs reviewed.  29 yo woman, c/o 3 week hx dental pain, 2 day hx low abdominal pain.  States + pregnant.  Last "normal" menses in May, but had some bleeding in July.  + vaginal discharge- "I may have a condom stuck in me".      Orders placed:  LABS     Patient requires further evaluation.     Wilfred Lacy, PA, 10/12/2014, 7:55 PM    Supervising physician Dr.  Earlene Plater was immediately available     Wilfred Lacy, [name]  10/12/14 [phone]

## 2014-10-12 NOTE — ED Notes (Signed)
Patient abd pain x 2 days and also back pain. Patient is pregnant but unsure how far along. Patient admittes to cocaine use last night.

## 2014-10-13 LAB — URINALYSIS WITH MICROSCOPIC
Blood,UA: NEGATIVE
Ketones, UA: NEGATIVE
Nitrite,UA: NEGATIVE
Protein,UA: NEGATIVE mg/dL
RBC,UA: NONE SEEN /hpf (ref 0–2)
Specific Gravity,UA: 1.034 — ABNORMAL HIGH (ref 1.002–1.030)
WBC,UA: 2 /hpf (ref 0–5)
pH,UA: 5 (ref 5.0–8.0)

## 2014-10-13 LAB — AEROBIC CULTURE: Aerobic Culture: 0

## 2014-10-13 MED ORDER — DOXYCYCLINE MONOHYDRATE 100 MG PO CAPS *I*
100.0000 mg | ORAL_CAPSULE | Freq: Two times a day (BID) | ORAL | 0 refills | Status: AC
Start: 2014-10-13 — End: 2014-10-23

## 2014-10-13 MED ORDER — METRONIDAZOLE 500 MG PO TABS *I*
500.0000 mg | ORAL_TABLET | Freq: Three times a day (TID) | ORAL | 0 refills | Status: AC
Start: 2014-10-13 — End: 2014-10-20

## 2014-10-13 MED ORDER — FLUCONAZOLE 150 MG PO TABS *I*
150.0000 mg | ORAL_TABLET | Freq: Once | ORAL | Status: AC
Start: 2014-10-13 — End: 2014-10-13
  Administered 2014-10-13: 150 mg via ORAL
  Filled 2014-10-13: qty 1

## 2014-10-13 MED ORDER — OXYCODONE-ACETAMINOPHEN 2.5-325 MG PO TABS *A*
2.0000 | ORAL_TABLET | Freq: Four times a day (QID) | ORAL | 0 refills | Status: DC | PRN
Start: 2014-10-13 — End: 2014-10-31

## 2014-10-13 NOTE — Discharge Instructions (Signed)
Please fill your prescriptions that were sent to your pharmacy. Please follow-up with [facility] about dental pain and your PCP about your vaginal discharge.

## 2014-10-13 NOTE — ED Provider Progress Notes (Signed)
ED Provider Progress Note    Discussed plan with patient. Patient is aware that she needs to fill prescriptions at pharmacy. Patient to follow up with [facility] regarding dental pain and her PCP early next week. Patient okay with this plan.       Resa Miner, MD, 10/13/2014, 2:08 AM         Resa Miner, MD  Resident  10/13/14 [phone]

## 2014-10-16 ENCOUNTER — Encounter: Payer: Self-pay | Admitting: Orthopedic Surgery

## 2014-10-16 NOTE — Progress Notes (Signed)
Letter addressed to pt on 09/18/14 returned via [employer] as undeliverable.  Noted in [employer] that pt is listed as homeless.

## 2014-10-27 ENCOUNTER — Emergency Department
Admission: EM | Admit: 2014-10-27 | Disposition: A | Discharge status: Home / Self Care | Payer: Self-pay | Source: Ambulatory Visit

## 2014-10-27 ENCOUNTER — Encounter: Payer: Self-pay | Admitting: Emergency Medicine

## 2014-10-27 MED ORDER — PENICILLIN V POTASSIUM 500 MG PO TABS *I*
500.0000 mg | ORAL_TABLET | Freq: Once | ORAL | Status: AC
Start: 2014-10-27 — End: 2014-10-27
  Administered 2014-10-27: 500 mg via ORAL
  Filled 2014-10-27: qty 1

## 2014-10-27 MED ORDER — MORPHINE SULFATE 4 MG/ML IJ SOLN
4.0000 mg | Freq: Once | INTRAMUSCULAR | Status: AC
Start: 2014-10-27 — End: 2014-10-27
  Administered 2014-10-27: 4 mg via INTRAMUSCULAR
  Filled 2014-10-27: qty 1

## 2014-10-27 MED ORDER — OXYCODONE-ACETAMINOPHEN 5-325 MG PO TABS *I*
1.0000 | ORAL_TABLET | ORAL | 0 refills | Status: AC | PRN
Start: 2014-10-27 — End: 2014-10-29

## 2014-10-27 MED ORDER — PENICILLIN V POTASSIUM 500 MG PO TABS *I*
500.0000 mg | ORAL_TABLET | Freq: Four times a day (QID) | ORAL | 0 refills | Status: DC
Start: 2014-10-27 — End: 2014-11-02

## 2014-10-27 MED ORDER — MORPHINE SULFATE 4 MG/ML IJ SOLN
4.0000 mg | Freq: Once | INTRAMUSCULAR | Status: DC
Start: 2014-10-27 — End: 2014-10-27

## 2014-10-27 MED ORDER — OXYCODONE-ACETAMINOPHEN 5-325 MG PO TABS *I*
2.0000 | ORAL_TABLET | Freq: Once | ORAL | Status: AC
Start: 2014-10-27 — End: 2014-10-27
  Administered 2014-10-27: 2 via ORAL
  Filled 2014-10-27: qty 2

## 2014-10-27 MED ORDER — CHLORHEXIDINE GLUCONATE 0.12 % MT SOLN *I*
15.0000 mL | Freq: Two times a day (BID) | OROMUCOSAL | 0 refills | Status: DC
Start: 2014-10-27 — End: 2014-11-16

## 2014-10-27 NOTE — ED Notes (Deleted)
Pt loud and swearing in [facility] and asked to please stop as there are other pt's here. Pt asking why she is being asked why she is here and to "read the damn records."

## 2014-10-27 NOTE — First Provider Contact (Signed)
ED Medical Screening Exam Note    Initial provider evaluation performed by   ED First Provider Contact     Date/Time Event User Comments    10/27/14 1759 ED Provider First Contact Katherene Ponto E Initial Face to Face Provider Contact        29 y/o female here with right ankle pain for 2 weeks. Surgery with hardware 4/16, "screws broke". +Swelling. No new trauma. Left dental pain as well.     Vital signs reviewed.    Orders placed:  XRAYS and ANALGESIA     Patient requires further evaluation.     Basilia Jumbo, NP, 10/27/2014, 5:59 PM    Supervising physician ERIC DAVIS was immediately available     Basilia Jumbo, NP  10/27/14 1801

## 2014-10-27 NOTE — ED Triage Notes (Signed)
Triage Note   Pt with R ankle injury and had surgery April of this year. Pt did not follow up and states there is hardware that she thinks may need to be removed. Ankle appears swollen. Pt also with "hole in tooth."     Velna Ochs, RN

## 2014-10-27 NOTE — ED Notes (Signed)
Very anxious for discharge.  Understands to follow up with dental center and ortho.  Reviewed eprescritpions. Advised not to drink alcohol,, drive or take tylenol while   taking her pain medication.  Pt states he will be spending the night upstairs with her sister whose baby just had surgery.   No iv

## 2014-10-27 NOTE — Discharge Instructions (Signed)
Right Ankle: Your xray still shows fractured screw without any new changes.  We talked to the Orthopedic consult who said you need to return to surgery for revision.    Dental Pain:  You have a cavity that needs to be taken care of my a dentist.  Please go to the [facility] on Mon 10/28/14 at 8am.    Percocet for pain.  Peridex mouthwash to prevent infection.  Penicillin antibiotic to prevent infection.    Return to the ER for worsening symptoms.
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
## 2014-10-30 ENCOUNTER — Encounter: Payer: Self-pay | Admitting: Emergency Medicine

## 2014-10-30 ENCOUNTER — Observation Stay
Admission: EM | Admit: 2014-10-30 | Disposition: A | Discharge status: Home / Self Care | Payer: Self-pay | Source: Ambulatory Visit | Attending: Emergency Medicine | Admitting: Emergency Medicine

## 2014-10-30 LAB — BASIC METABOLIC PANEL
Anion Gap: 17 — ABNORMAL HIGH (ref 7–16)
CO2: 22 mmol/L (ref 20–28)
Calcium: 9.3 mg/dL (ref 8.8–10.2)
Chloride: 101 mmol/L (ref 96–108)
Creatinine: 0.88 mg/dL (ref 0.51–0.95)
GFR,Black: 103 *
GFR,Caucasian: 89 *
Glucose: 112 mg/dL — ABNORMAL HIGH (ref 60–99)
Lab: 16 mg/dL (ref 6–20)
Potassium: 4.2 mmol/L (ref 3.3–5.1)
Sodium: 140 mmol/L (ref 133–145)

## 2014-10-30 LAB — CBC AND DIFFERENTIAL
Baso # K/uL: 0.1 10*3/uL (ref 0.0–0.1)
Basophil %: 0.5 %
Eos # K/uL: 0.3 10*3/uL (ref 0.0–0.4)
Eosinophil %: 2.3 %
Hematocrit: 28 % — ABNORMAL LOW (ref 34–45)
Hemoglobin: 7.8 g/dL — ABNORMAL LOW (ref 11.2–15.7)
IMM Granulocytes #: 0 10*3/uL (ref 0.0–0.1)
IMM Granulocytes: 0.3 %
Lymph # K/uL: 2.5 10*3/uL (ref 1.2–3.7)
Lymphocyte %: 19.9 %
MCH: 21 pg/cell — ABNORMAL LOW (ref 26–32)
MCHC: 28 g/dL — ABNORMAL LOW (ref 32–36)
MCV: 76 fL — ABNORMAL LOW (ref 79–95)
Mono # K/uL: 1.1 10*3/uL — ABNORMAL HIGH (ref 0.2–0.9)
Monocyte %: 8.7 %
Neut # K/uL: 8.5 10*3/uL — ABNORMAL HIGH (ref 1.6–6.1)
Nucl RBC # K/uL: 0 10*3/uL (ref 0.0–0.0)
Nucl RBC %: 0 /100 WBC (ref 0.0–0.2)
Platelets: 404 10*3/uL — ABNORMAL HIGH (ref 160–370)
RBC: 3.7 MIL/uL — ABNORMAL LOW (ref 3.9–5.2)
RDW: 17.9 % — ABNORMAL HIGH (ref 11.7–14.4)
Seg Neut %: 68.3 %
WBC: 12.4 10*3/uL — ABNORMAL HIGH (ref 4.0–10.0)

## 2014-10-30 LAB — PREGNANCY TEST, SERUM: Preg,Serum: POSITIVE — AB

## 2014-10-30 LAB — ACETAMINOPHEN LEVEL: Acetaminophen: <2 ug/mL

## 2014-10-30 LAB — SALICYLATE LEVEL: Salicylate: <2 mg/dL — ABNORMAL LOW (ref 15.0–30.0)

## 2014-10-30 LAB — BHCG, QUANT PREGNANCY: BHCG, QUANT PREGNANCY: 6926 m[IU]/mL — ABNORMAL HIGH (ref 0–1)

## 2014-10-30 LAB — ETHANOL: Ethanol: <10 mg/dL

## 2014-10-30 MED ORDER — HYDROCODONE-ACETAMINOPHEN 5-325 MG PO TABS *I*
2.0000 | ORAL_TABLET | Freq: Once | ORAL | Status: AC
Start: 2014-10-30 — End: 2014-10-30
  Administered 2014-10-30: 2 via ORAL
  Filled 2014-10-30: qty 2

## 2014-10-30 MED ORDER — MICONAZOLE NITRATE 2 % EX CREA *I*
TOPICAL_CREAM | Freq: Two times a day (BID) | CUTANEOUS | 0 refills | Status: AC
Start: 2014-10-30 — End: 2014-11-06

## 2014-10-30 MED ORDER — FLUCONAZOLE 150 MG PO TABS *I*
150.0000 mg | ORAL_TABLET | Freq: Once | ORAL | Status: DC
Start: 2014-10-30 — End: 2014-10-30
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
## 2014-10-30 NOTE — ED Procedure Documentation (Addendum)
Procedures   Ultrasound  Date/Time: 10/30/2014 10:30 PM  Performed by: Acquanetta Chain  Authorized by: Jarrett Ables   Consent: Verbal consent obtained.  Risks and benefits: risks, benefits and alternatives were discussed  Consent given by: patient  Patient understanding: patient states understanding of the procedure being performed  Patient tolerance: Patient tolerated the procedure well with no immediate complications  Comments: Procedure: Emergency Medicine Limited Pelvic Ultrasound    Procedure performed by Acquanetta Chain, MD and Dr. Karleen Hampshire    Date: 10/30/2014   Time: 10:31 PM      Type Transabdominal and Endovaginal (EV)    Indications   Evaluation for the following: Evaluation for intrauterine pregnancy and Positive ED pregnancy test before ultrasound, quantitative B-hCG    Findings:   Exam limitations include the following: No limitations    Structures visualized include the following: uterus  Visualization of fluid: No pelvic free fluid  Additional findings: Gestational sac and yolk sac, no FHR    Impression:  IUP        Images were interpreted by me, Acquanetta Chain, MD  Images were archived to [facility] PACS        Acquanetta Chain, MD     Acquanetta Chain, MD  Resident  10/30/14 2234        Ultrasound Procedure: I supervised the procedure, and was immediately available during the procedure.    Images: Images were reviewed and interpreted by me and I agree with the resident interpretation as documented.    Jarrett Ables, MD as of 10/30/2014 at 10:41 PM       Jarrett Ables, MD  10/30/14 2242

## 2014-10-30 NOTE — Progress Notes (Signed)
Patient with itchy white discharge on pelvic exam, thinks she has yeast infection, consistent with previous, swabs sent.  Will treat with topical miconazole for 1 week as safer in pregnancy, Rx in paper chart.     Acquanetta Chain, MD  Resident  10/30/14 [phone]
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
## 2014-10-31 ENCOUNTER — Telehealth: Payer: Self-pay | Admitting: Internal Medicine

## 2014-10-31 LAB — N. GONORRHOEAE DNA AMPLIFICATION: N. gonorrhoeae DNA Amplification: 0

## 2014-10-31 LAB — VAGINITIS SCREEN: DNA PROBE: Vaginitis Screen:DNA Probe: POSITIVE

## 2014-10-31 LAB — CHLAMYDIA PLASMID DNA AMPLIFICATION: Chlamydia Plasmid DNA Amplification: 0

## 2014-10-31 MED ORDER — TUBERCULIN PPD 5 UNIT/0.1ML ID SOLN *I*
5.0000 [IU] | Freq: Once | INTRADERMAL | Status: AC
Start: 2014-10-31 — End: 2014-10-31
  Administered 2014-10-31: 5 [IU] via INTRADERMAL
  Filled 2014-10-31: qty 0.1

## 2014-10-31 MED ORDER — MAGNESIUM HYDROXIDE 400 MG/5ML PO SUSP *I*
30.0000 mL | Freq: Every day | ORAL | Status: DC | PRN
Start: 2014-10-31 — End: 2014-11-04

## 2014-10-31 MED ORDER — IBUPROFEN 200 MG PO TABS *I*
600.0000 mg | ORAL_TABLET | Freq: Once | ORAL | Status: AC
Start: 2014-10-31 — End: 2014-10-31
  Administered 2014-10-31: 600 mg via ORAL
  Filled 2014-10-31: qty 3

## 2014-10-31 MED ORDER — MICONAZOLE NITRATE 2 % VA CREA *I*
1.0000 | TOPICAL_CREAM | Freq: Every evening | VAGINAL | Status: DC
Start: 2014-10-31 — End: 2014-11-04
  Administered 2014-10-31 – 2014-11-03 (×5): 1 via VAGINAL
  Filled 2014-10-31 (×6): qty 45

## 2014-10-31 MED ORDER — ALUM & MAG HYDROXIDE-SIMETH 200-200-20 MG/5ML PO SUSP *I*
30.0000 mL | Freq: Three times a day (TID) | ORAL | Status: DC | PRN
Start: 2014-10-31 — End: 2014-11-04
  Filled 2014-10-31: qty 30

## 2014-10-31 MED ORDER — QUETIAPINE FUMARATE 25 MG PO TABS *I*
50.0000 mg | ORAL_TABLET | Freq: Every evening | ORAL | Status: DC
Start: 2014-10-31 — End: 2014-11-04
  Administered 2014-10-31 – 2014-11-03 (×4): 50 mg via ORAL
  Filled 2014-10-31 (×4): qty 2

## 2014-10-31 MED ORDER — MICONAZOLE NITRATE 2 % VA CREA *I*
1.0000 | TOPICAL_CREAM | Freq: Every evening | VAGINAL | Status: DC
Start: 2014-10-31 — End: 2014-10-31
  Filled 2014-10-31: qty 45

## 2014-10-31 MED ORDER — ACETAMINOPHEN 325 MG PO TABS *I*
650.0000 mg | ORAL_TABLET | ORAL | Status: DC | PRN
Start: 2014-10-31 — End: 2014-11-01
  Administered 2014-10-31 – 2014-11-01 (×4): 650 mg via ORAL
  Filled 2014-10-31 (×6): qty 2

## 2014-10-31 NOTE — Telephone Encounter (Signed)
Spoke to [place], social work and advised him to look at [employer] [facility].   Last office visit with physical on 01/11/15.   He reports they will keep patient in observation for evening.   He reports pt might be placed in a women's shelter.   Writer will notify Dr. Allena Katz as Lor[facility] Childes.

## 2014-10-31 NOTE — Telephone Encounter (Signed)
Writer called Molli Hazard, social worker in psych ED, and left message with secretary  Pt last seen in clinic by urgent care on 06/19/14.   No history of PPD on file. Advised her to have SW call back. Provided number.

## 2014-10-31 NOTE — CPEP Notes (Signed)
Collateral: Drema Balzarine, pts aunt    Writer left message. Waiting for return call.
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
## 2014-10-31 NOTE — ED Notes (Signed)
Patient maintained on Q 15 minute safety checks throughout night shift.
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
## 2014-10-31 NOTE — CPEP Notes (Signed)
CPEP Provider Initial Contact     Patient evaluated briefly by me for initial contact on 10/31/2014  at 0355    Patient here for SI.  Patient denies any pain currently.  Patient informed of CPEP process and wait times.     Vital signs reviewed.  Orders placed: other- none    No other immediate concerns.  Patient requires further evaluation.    Nicolasa Ducking, MD, 10/31/2014, 4:13 AM     Nicolasa Ducking, MD  10/31/14 [phone]

## 2014-10-31 NOTE — ED Notes (Signed)
Pt not able to wake 4  Vitals 0045

## 2014-10-31 NOTE — Telephone Encounter (Signed)
Jenna [name] calling with Social Worker in the Psych ED. He is requesting a call back regarding needing a PPD reading and the most recent Physical information. He states it is for emergency housing.    He can be reached at [phone]
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
## 2014-11-01 ENCOUNTER — Telehealth: Payer: Self-pay

## 2014-11-01 MED ORDER — FLUOXETINE HCL 10 MG PO CAPS *I*
10.0000 mg | ORAL_CAPSULE | Freq: Every day | ORAL | Status: DC
Start: 2014-11-01 — End: 2014-11-04
  Administered 2014-11-01 – 2014-11-04 (×4): 10 mg via ORAL
  Filled 2014-11-01 (×4): qty 1

## 2014-11-01 MED ORDER — FLUOXETINE HCL 10 MG PO CAPS *I*
10.0000 mg | ORAL_CAPSULE | Freq: Every day | ORAL | 0 refills | Status: DC
Start: 2014-11-01 — End: 2015-11-24

## 2014-11-01 MED ORDER — ACETAMINOPHEN 325 MG PO TABS *I*
650.0000 mg | ORAL_TABLET | Freq: Four times a day (QID) | ORAL | Status: DC | PRN
Start: 2014-11-01 — End: 2014-11-04
  Administered 2014-11-01 – 2014-11-03 (×5): 650 mg via ORAL
  Filled 2014-11-01 (×4): qty 2

## 2014-11-01 MED ORDER — QUETIAPINE FUMARATE 50 MG PO TABS *I*
50.0000 mg | ORAL_TABLET | Freq: Every evening | ORAL | 0 refills | Status: DC
Start: 2014-11-01 — End: 2015-11-24

## 2014-11-01 NOTE — Telephone Encounter (Signed)
NPI Scheduling Form- NPI Scheduled via telephone.    Send this completed form as a phone task to :   [facility] New Prenatal Intake pool    Ms. Jenna [name] is a 29 y.o. woman requesting prenatal care.     Pregancy test done? Yes  At home or in medical office? [facility]  If +UPT is from home only, schedule confirmation of pregnancy visit prior to NPI.     LMP or EDD if known: 3 Months along      Confirm that patient plans to deliver at [facility]. yes     Are you having any acute issues this pregnancy? unknown   (ex: bleeding, pain, ect...)  If yes, schedule confirmation of pregnancy visit to address prior to NPI.     Have you had a high risk pregnancy in the past? unknown  If yes, Why do you think you are high risk?    Please verify:   Would patient be agreeable to a telephone intake if called? unknown.    Please remember NPIs should be scheduled no earlier than 8.0 weeks, and late entrants at 19.0+ weeks should have NPI & NOB booked urgently.   NPI scheduled for 11-11-14 t 10:00 by phone via Corrie Dandy of [facility]      ____________________________________________________________________

## 2014-11-01 NOTE — CPEP Notes (Signed)
Night Shift Note: the patient woke about 0430 and used the bathroom.  She then came into the common area.  She requested and received a snack (cereal and milk, and ice cream).  Afterwards she returned to her room and seemed to return to sleeping without difficulty.      She otherwise appeared to sleep through the night without distress. Q 15 minute safety and comfort checks maintained.

## 2014-11-01 NOTE — CPEP Notes (Signed)
[facility] SOCIAL WORK  PHARMACY FORM  Todays date:  November 01, 2014    Patient Name: Jenna [name]     Medical Record #:[id number]  Patients Address: [address]                DOB: 1985-05-18  Social Worker: [name] Scotland, LMSW   Date of Service: November 01, 2014         Funding Source    Medicaid Pending        [place] Medication Assistance Fund    [employer]     Other source                          ___________________________________________________________________  Pharmacy Information:     Date/time sent:November 01, 2014 Time needed:  ASAP           ED  After hours  Outpatient  Inpatient (Specify unit) CPEP EOB-3/EOB-3      Please call ext.   [phone]   Then ready   Patient will pick up at pharmacy    Please tube to station #   [id number]      when ready      Pharmacy Contact:              Social work signature: [name] Scotland, LMSW  Supervisor/Manager Approval (if indicated) :            Date: 11/01/14  (supervisor signature not required for [facility] pending)    Pharmacy Fax # [phone]  Tube [phone]  Print form and fax or tube to pharmacy.  If it requires Supervisor approval copy and email to supervisor

## 2014-11-01 NOTE — ED Notes (Signed)
CPEP-EOB Shift Assessment Note     Suicide observations:  Suicide Observations  Attempting or threatening suicide/self harm? : No  Danger to Self : No  Individualized Safety Plan: No (Comment)    Violence risk:  Violence  *Attempt to Harm: No  * Homicidal Ideation: No  *Homicidal ideation with intent: No  *Pt/Family Identified Calming  Techniques: Frequent contact, Watch TV (food)  *Staff  Identified Calming Techniques : Frequent contact, Watch TV    Mental Status Exam:   Mental Status Exam  Appearance:  (in pijamas)  Relationship to Interviewer: Cooperative, Engages well  Psychomotor Activity: Normal  Abnormal Movements: None  Muscle Strength and Tone: Normal  Station/Gait : Normal  Speech : Articulate  Language: Fluent  Mood:  (depressed and angry)  Affect: Anxious, Dysphoric  Thought Process: Tangential, Logical  Thought Content: No suicidal ideation, No homicidal ideation  Perceptions/Associations : No hallucinations  Sensorium: Oriented x3  Cognition: Fair attention span  [employer] of Knowledge: Normal  Insight : Poor  Judgement: Poor      Interventions:  Interventions: q15 minute monitoring maintained, Frequent supportive interations, Psych education regarding coping skills, Nutrition/hydration, Comfort measures, Safety planning, Medication administration, Education, Assessment of patient response to education    CIWA & COWS:   NA    VS:   Visit Vitals    BP 149/80 (BP Location: Right arm)    Pulse 79    Temp 36.5 C (97.7 F) (Temporal)    Resp 16    Ht 1.626 m ( )    Wt 127 kg (280 lb)    SpO2 100%    BMI 48.06 kg/m2       Pain assessment:  Last Nursing documented pain:  0-10 Scale: 8 (11/01/14 1240)      PRN medications administered: Yes acetaminophen 650 mg for headache    RN Comments: Pt. Has remained cooperative and engaging throughout the day despite stating she was angry and in pain. Pt. Made a number of calls for housing, was accepted by [facility] for a Hospitality Bed before 6pm this  evening.  Telephone call from [place] from [facility] surgeon of the availability of the bed. Writer informed him that pt. Might not be discharged today; he stated pt. Or we needed to call [address] [phone]) to postpone the bed, but that it would be available for the weekend, and he would pass that on to other staff.  Dr. Doyce Para MD and Amado Nash MSW informed, and sent scripts and voucher respectively to [facility] pharmacy.  Dr. Doyce Para would like pt. To stay one more day.  Telephone call to [address]---the operator connected writer to [facility], after informing me that the bed would only be available if pt. Arrived by 6pm this evening.  Message left for [facility], [address] [phone].   Appts. Made for [facility] and orthopedics. I was unable to get through to [facility] to make CD appt.   Pt.'s PPD, for placement in residential settings, is scheduled to be read between 8/20-1715 and 8/21 1715.    Ralph Leyden, RN, 3:43 PM

## 2014-11-01 NOTE — CPEP Notes (Addendum)
Clinical Evaluator Progress Note    Contact with Pt:   - Pt is a 28yo African-American single female with a pphx of depression who presented for evaluation voluntarily identifying suicidal ideation as her main concern at this time. Pt reported that three days ago she had a suicide attempt by overdose that was stopped by her mother who induced vomiting. Additional stressors included homelessness, substance abuse, and finding out she was pregnant in the ED. Today, pt continues to presented dysphoric and tearful with a reported depress mood and congruent affect. She also reported feeling angry over her circumstances and not knowing where to turn for help. Plan to work work on identifying housing options and linking with treatment.    Intervention(s)/Plan:   - Referrals faxed:     - [facility] (intake coordinator not in the office - referral to be reviewed Monday)     - [facility]   - Pt is working with DSS for emergency placement      - If placement is not available explore [facility] and going to [employer] Caleen Essex)    11:35am:  DSS Team [phone]   - SW informed that pt's case was recently closed after she did not appear for a face-to-face re-certification interview. Although her food stamps and [employer] are still active she would need to come to the office and re-apply for benefits. SW also informed that pt's 45-day sanctions have been lifted from June, and she may be eligible for a hospitality bed based on her emergency housing rejection when calling [facility].

## 2014-11-01 NOTE — Progress Notes (Cosign Needed)
CPEP EOB PROGRESS NOTE    Patient seen and evaluated by me today, 11/01/2014 at 10.45    9.40 Legal Status confirmed: yes    Final diagnoses:   Suicidal ideation   Normal IUP (intrauterine pregnancy) on prenatal ultrasound, unspecified trimester   Depressive disorder         Patient Concerns:      She says she could not sleep well last night. She is eating more. She has low energy     Patient says that her mood is  " very depressed and angry " . She says she has overdosed on a mixture of pills.    At the present time, Patient denies any suicidal thoughts/plans/intents. Pt denies any homicida thoughts any thoughts/plans/intents. Pt denies any auditory or visual hallucinations.    She says  She has 4 children. None of them lives with her.     She says she is pregnant. She does not want to have a new child.     She says she uses cocaine on and off. She has sobriety times.           Overnight Events: In CPEP was irritable, angry and demanding. But after she came to EOB, she has been cooperative     Most recent Vitals:  Patient Vitals for the past 24 hrs:   BP Temp Temp src Pulse Resp SpO2   11/01/14 0741 149/80 36.5 C (97.7 F) TEMPORAL 79 16 100 %   11/01/14 0010 - - - - 16 -   10/31/14 1705 132/59 36.8 C (98.2 F) TEMPORAL 86 - 100 %        Psychiatric Assessments:    Mental Status Exam  Appearance:  (in pijamas)  Relationship to Interviewer: Cooperative, Engages well  Psychomotor Activity: Normal  Abnormal Movements: None  Muscle Strength and Tone: Normal  Station/Gait : Normal  Speech : Articulate  Language: Fluent  Mood:  (depressed and angry)  Affect: Anxious, Dysphoric  Thought Process: Tangential, Logical  Thought Content: No suicidal ideation, No homicidal ideation  Perceptions/Associations : No hallucinations  Sensorium: Oriented x3  Cognition: Fair attention span  [employer] of Knowledge: Normal  Insight : Poor  Judgement: Poor    Pertinent Labs:   All labs in the last 72 hours:  Recent Results (from the past 72  hour(s))   CBC and differential    Collection Time: 10/30/14  4:50 PM   Result Value Ref Range    WBC 12.4 (H) 4.0 - 10.0 THOU/uL    RBC 3.7 (L) 3.9 - 5.2 MIL/uL    Hemoglobin 7.8 (L) 11.2 - 15.7 g/dL    Hematocrit 28 (L) 34 - 45 %    MCV 76 (L) 79 - 95 fL    MCH 21 (L) 26 - 32 pg/cell    MCHC 28 (L) 32 - 36 g/dL    RDW 16.1 (H) 09.6 - 14.4 %    Platelets 404 (H) 160 - 370 THOU/uL    Seg Neut % 68.3 %    Lymphocyte % 19.9 %    Monocyte % 8.7 %    Eosinophil % 2.3 %    Basophil % 0.5 %    Neut # K/uL 8.5 (H) 1.6 - 6.1 THOU/uL    Lymph # K/uL 2.5 1.2 - 3.7 THOU/uL    Mono # K/uL 1.1 (H) 0.2 - 0.9 THOU/uL    Eos # K/uL 0.3 0.0 - 0.4 THOU/uL    Baso # K/uL 0.1  0.0 - 0.1 THOU/uL    Nucl RBC % 0.0 0.0 - 0.2 /100 WBC    Nucl RBC # K/uL 0.0 0.0 - 0.0 THOU/uL    IMM Granulocytes # 0.0 0.0 - 0.1 THOU/uL    IMM Granulocytes 0.3 %   Basic metabolic panel    Collection Time: 10/30/14  4:50 PM   Result Value Ref Range    Glucose 112 (H) 60 - 99 mg/dL    Sodium 161 096 - 045 mmol/L    Potassium 4.2 3.3 - 5.1 mmol/L    Chloride 101 96 - 108 mmol/L    CO2 22 20 - 28 mmol/L    Anion Gap 17 (H) 7 - 16    UN 16 6 - 20 mg/dL    Creatinine 4.09 8.11 - 0.95 mg/dL    GFR,Caucasian 89 *    GFR,Black 103 *    Calcium 9.3 8.8 - 10.2 mg/dL   Pregnancy Test, Serum    Collection Time: 10/30/14  4:50 PM   Result Value Ref Range    Preg,Serum POS (A) NEGATIVE   Ethanol    Collection Time: 10/30/14  4:50 PM   Result Value Ref Range    Ethanol <10 mg/dL   Salicylate level    Collection Time: 10/30/14  4:50 PM   Result Value Ref Range    Salicylate <2.0 (L) 15.0 - 30.0 mg/dL   Acetaminophen level    Collection Time: 10/30/14  4:50 PM   Result Value Ref Range    Acetaminophen <2 ug/mL   BHCG,serum ([facility])    Collection Time: 10/30/14  4:50 PM   Result Value Ref Range    BHCG 6926 (H) 0 - 1 mIU/mL   Chlamydia plasmid DNA amplification    Collection Time: 10/30/14 10:40 PM   Result Value Ref Range    Chlamydia Plasmid DNA Amplification .     N. Gonorrhoeae DNA amplification    Collection Time: 10/30/14 10:40 PM   Result Value Ref Range    N. gonorrhoeae DNA Amplification .    Vaginitis screen: DNA probe    Collection Time: 10/30/14 10:40 PM   Result Value Ref Range    Vaginitis Screen:DNA Probe Positive for Candida Nucleic Acid        CIWA Score:    Opiate Withdrawal Score:       Assessment: 29 year old female with history of depression and substance use admitted to EOB secondary to  Depression and suicidal thoughts. During admission she learned that she is pregnant. She wants to continue with her pregnancy.   During rounds her mental health issues and substance use was discussed.  To target her depression Prozac is going to be started. Risk/benefits and probable side effects to her and the fetus was discussed. Pt reports he understands it and agrees with it. Meanwhile seroquel will be continued.  For subtansce use, patient will be linked to CD treatment.      Plan:   - Suicide precautions  - Ward Milieu therapy  - Prozac 10 mg po daily    Seroquel 50 mg po qhs    Reasons for Continued Stay: Stabilization of gains and Need for safe D/C    Author: Pierre Bali, MD  as of: 11/01/2014  at: 10:43 AM     Pierre Bali, MD  11/01/14 1202

## 2014-11-01 NOTE — Plan of Care (Signed)
Problem: Risk for Suicide  Goal: Patient Will Be Safe And Free From Injury Throughout Hospitalization  This goal applies for the duration of hospitalization   Intervention: Assist Patient to Develop a Home Safety Plan  Pt. Has started a home safety plan. She easily identifies her warning signs, lists distractions. However, she minimizes her usage "I only use intermittently".     Goal: Patient Will Identify 2 Alternative Ways Of Dealing With Stress And Emotional Problems By Day 3  Patient is to IDENTIFY healthy coping skills. For example, talking with staff or significant others   Pt. Was introduced to reframing, spelling her name while breathing, radical acceptance.     Problem: Risk for Withdrawal  Goal: Patient Will Verbalize, By Discharge, Benefit Of Participating In Their Post Discharge Recovery Treatment  Pt. Plans to return to [facility], which is also the treatment facility to which DSS had previously placed her.

## 2014-11-02 MED ORDER — PENICILLIN V POTASSIUM 500 MG PO TABS *I*
500.0000 mg | ORAL_TABLET | Freq: Four times a day (QID) | ORAL | Status: AC
Start: 2014-11-02 — End: 2014-11-04
  Administered 2014-11-02 – 2014-11-04 (×10): 500 mg via ORAL
  Filled 2014-11-02 (×10): qty 1

## 2014-11-02 MED ORDER — PENICILLIN V POTASSIUM 500 MG PO TABS *I*
500.0000 mg | ORAL_TABLET | Freq: Four times a day (QID) | ORAL | 0 refills | Status: AC
Start: 2014-11-02 — End: 2014-11-10

## 2014-11-02 NOTE — CPEP Notes (Signed)
Night Shift Note: the patient experienced some intermittent coughing about 0120 that did not seem to wake her up.    The patient woke about 0400, used the bathroom and then came into the common area.  She requested and received saltines with peanut butter, and regular ginger ale.  She also received her first dose of penicillin.  Afterwards she seemed to return to sleeping without difficulty.    She otherwise appeared to sleep through the night without distress.  Q 15 minute safety and comfort checks maintained.

## 2014-11-02 NOTE — CPEP Notes (Signed)
EOB shift note    Pt in milieu most of this shift watching tv. Pt eating snacks and drinking fluids. Pt took medications without issues. Pleasant on interaction. Will continue to monitor.

## 2014-11-02 NOTE — CPEP Notes (Addendum)
CPEP EOB PROGRESS NOTE    Patient seen and evaluated by me today, 11/02/2014 at 9:00AM    9.40 Legal Status confirmed: yes    Final diagnoses:   Suicidal ideation   Normal IUP (intrauterine pregnancy) on prenatal ultrasound, unspecified trimester   Depressive disorder         Patient Concerns:  Jenna [name] states she is feeling "so-so." She states she is feeling better than she was at admission. She states she has not been sleeping well. She is sleeping about 4-5 hours. She denies any specific reasons to wake up, "I just wake up." She feels this is slightly improving since being starting on medication. She endorses continued cravings to use drugs but states these are tolerable currently. She has been feeling very hungry at all hours of the day. She endorses anxiety on and off but feels that her mood has improved overall. She denies current suicidal ideation.      Overnight Events: Nursing notes reviewed. No acute events overnight. Appeared to sleep well. Compliant with all medications.     Most recent Vitals:  Patient Vitals for the past 24 hrs:   BP Temp Temp src Pulse Resp SpO2   11/02/14 0758 127/65 36.8 C (98.2 F) TEMPORAL 82 - -   11/01/14 1905 131/56 36.8 C (98.2 F) TEMPORAL 84 - 100 %   11/01/14 1500 127/77 36.8 C (98.2 F) TEMPORAL 85 18 100 %   11/01/14 1330 - - - - 16 -        Psychiatric Assessments:    Mental Status Exam  Appearance: Groomed, Appropriately dressed, Disheveled (freshly showered and dressed but laying in bed with blankets over face to block out sun)  Relationship to Interviewer: Cooperative, Eye contact good, Friendly  Psychomotor Activity: Normal  Abnormal Movements: None  Muscle Strength and Tone: Normal  Station/Gait :  (did not assess but has been up and about on the unit per nursing report)  Speech : Regular rate, Normal tone, Normal rhythm, Normal amount, Articulate  Language: Fluent, Normal comprehension  Mood: Dysphoric  Affect: Appropriate, Reactive  Thought Process: Logical,  Sequential, Goal-directed  Thought Content: No suicidal ideation, No homicidal ideation, No delusions, No obsessions/compulsions  Perceptions/Associations : No hallucinations  Sensorium: Alert, Oriented x3  Cognition: Recent memory intact, Fair attention span  [employer] of Knowledge: Normal  Insight : Fair  Judgement: Fair    Pertinent Labs: All labs in the last 24 hours: No results found for this or any previous visit (from the past 24 hour(s)).    Assessment:   Jenna [name] is a 29 y.o. African American woman with a past psychiatric history of depression and substance use who presented to CPEP with worsening mood and suicidal ideation. She also found out that she is currently pregnant during her evaluation in the medical emergency room. She was admitted to EOB for further observation and to be started on medications. After discussing the risks and benefits of untreated depression vs her current medications on the fetus, Jenna [name] consents to continued treatment. At this time she is displaying improvements in her mood and no longer endorses suicidal ideation. She is working on finding emergency housing and is agreeable to discharge this evening if a bed is available or early tomorrow.     Plan:   1. Continue Prozac 10 mg PO daily  2. Continue Seroquel 50 mg PO nightly for sleep  3. Continue Penicillin v potassium 500 mg PO four (4) times daily for  oral infection diagnosed in [facility] medical ED on 10/27/14 for 10 doses. (Started on 11/02/14 due to patient never starting the medication as an outpatient)  4. Suicide precautions  5. Milieu therapy  6. PPD to be read after 5:15 pm today  7. If emergency housing available, may be discharged after PPD read    Reasons for Continued Stay: Need for safe D/C    Author: Redgie Grayer, DO  as of: 11/02/2014  at: 9:56 AM     Redgie Grayer, DO  Resident  11/02/14 1316  I saw and evaluated the patient. I agree with the resident's/fellow's findings and plan of care as documented above. Pt has  called [address] and [place] for emergency housing, but pt is sanctioned from [facility] and there are no beds for [employer] ([facility]). Pt's PPD will be read tonight and then can be discharged tomorrow (either [facility] or emergency shelter). Scripts are already sent to be filled prior to D/C. Pt will need safety plan prior to d/c.    Kem Parkinson Amneet Cendejas, MD           Chyann Ambrocio, Kem Parkinson, MD  11/02/14 [phone]

## 2014-11-02 NOTE — CPEP Notes (Addendum)
[facility] SOCIAL WORK  PHARMACY FORM     Todays date:  November 02, 2014    Patient Name: Jayline Kilburg      Medical Record #: [id number]   DOB: 07/23/1985  Patients Address: [address]                Social Worker: Claud Kelp, LMSW       Date of Service: November 02, 2014       Funding Source: [employer] Medication Assistance Fund  ___________________________________________________________________    Pharmacy Information:  Date/time sent: November 02, 2014     Time needed: ASAP  Pharmacy Information:    Date/time sent:November 02, 2014 Time needed: ASAP      ED  After hours  Outpatient  Inpatient (Specify unit) CPEP EOB-3/EOB-3     Please call ext. [phone] Then ready   Patient will pick up at pharmacy   Please tube to station # [phone]  when ready  Patient Location: ED    Pharmacy Contact:   Social work signature: Claud Kelp, LMSW  Supervisor/Manager Approval (if indicated):  Date:11/02/14  (supervisor signature not required for [facility] pending)    Pharmacy Fax # [phone]  Tube [phone]  Print form and fax or tube to pharmacy.  If it requires Supervisor approval copy and email to supervisor

## 2014-11-02 NOTE — Plan of Care (Signed)
Problem: Risk for Suicide  Goal: Patient Will Be Safe And Free From Injury Throughout Hospitalization  This goal applies for the duration of hospitalization   Outcome: Maintaining  Pt remains safe and free from injury.    Goal: Patient Will Identify 2 Alternative Ways Of Dealing With Stress And Emotional Problems By Day 3  Patient is to IDENTIFY healthy coping skills. For example, talking with staff or significant others   Outcome: Progressing towards goal  Pt became stressed while trying to figure out where she would go on discharge.  She attempted to reach out to locations, but became discouraged when [facility] was one of the only options she heard.  Talked with Clinical research associate and also watched TV to distract.  Pt calmed easily.

## 2014-11-02 NOTE — ED Notes (Signed)
CPEP-EOB Shift Assessment Note     Suicide observations:  Suicide Observations  Attempting or threatening suicide/self harm? : No  Danger to Self : No  Individualized Safety Plan: Yes (Comment)    Violence risk:  Violence  *Attempt to Harm: No  * Homicidal Ideation: No  *Homicidal ideation with intent: No  *Pt/Family Identified Calming  Techniques: PRN medications, Watch TV, Talk with staff, Frequent contact  *Staff  Identified Calming Techniques : Quiet time in room, PRN medications, Watch TV, Talk with staff, Frequent contact    Mental Status Exam:   Mental Status Exam  Appearance: Disheveled  Relationship to Interviewer: Cooperative, Friendly  Psychomotor Activity: Normal  Abnormal Movements: None  Muscle Strength and Tone: Normal  Station/Gait : Normal  Speech : Regular rate, Normal tone, Normal rhythm  Language: Fluent, Normal comprehension  Mood: Dysphoric  Affect: Appropriate, Reactive  Thought Process: Logical, Sequential, Goal-directed  Thought Content: No suicidal ideation, No homicidal ideation, No delusions, No obsessions/compulsions  Perceptions/Associations : No hallucinations  Sensorium: Alert, Oriented x3  Cognition: Recent memory intact  Fund of Knowledge: Normal  Insight : Fair  Judgement: Fair      Interventions:  Interventions: q15 minute monitoring maintained, Frequent supportive interations, Psych education regarding coping skills, Nutrition/hydration, Comfort measures, Medication administration, Education, Assessment of patient response to education    CIWA & COWS:   NA    VS:   Visit Vitals    BP 137/61 (BP Location: Right arm)    Pulse 79    Temp 36.2 C (97.2 F) (Temporal)    Resp 18    Ht 1.626 m ( )    Wt 127 kg (280 lb)    SpO2 100%    BMI 48.06 kg/m2       Pain assessment:  Last Nursing documented pain:  0-10 Scale: 7 (11/02/14 1930)      PRN medications administered: Yes Tylenol    RN Comments: Pt good PO intake- eating 100% of dinner and snacks after.  Interacted well  with another patient playing card games and watching TV in the lounge. 15 minute safety checks maintained, will continue to monitor.      Burgess Amor, RN, 9:35 PM

## 2014-11-03 MED ORDER — HYDROXYZINE HCL 25 MG PO TABS *I*
25.0000 mg | ORAL_TABLET | Freq: Once | ORAL | Status: AC
Start: 2014-11-03 — End: 2014-11-03
  Administered 2014-11-03: 25 mg via ORAL
  Filled 2014-11-03: qty 1

## 2014-11-03 NOTE — CPEP Notes (Signed)
The patient was observed by this writer to make several calls this morning inquiring about the availability of emergency housing.  She reported calling #[phone] more than once, and reported to this Clinical research associate that the operator there told her a note at the desk indicates that they spoke to [facility] staff yesterday and arranged to hold a place for her yesterday at [facility] until 6 pm.  She reports that at this time they can only offer the [employer], but she was told to call back about 3pm to see if anything else has become available.  At this the patient became very dysphoric and upset.  This Clinical research associate spent some time talking with the patient and watching television with her, and since then she has returned to her bed.    This Clinical research associate called the [facility] in [place], [place] [phone]) about 0930, and spoke with a staff person, Misty Stanley, to inquire about the status of the referral sent to them for the patient this past Friday.  She said that staff that coordinates intakes won't be in until tomorrow morning (8a-4p).  She was able to tell this writer that there are currently open beds at the residence, but said that some or all of those beds are likely already slated to be filled by people for whom intake referrals received last week; she has no way of knowing if our patient is one of those people.  She also said that the facility would not be able to cover the patient's transportation to the residence, although she said that sometimes medicaid covers the cost of the transportation.    This Clinical research associate called the [facility] (connected with [facility], and located in [place], [place] [phone], Ext. #[phone]) about 1000, and left a message for the intake referral staff person, Andria Frames, inquiring about the status of the referral sent to them for the patient this past Friday.  The [id number] nurse's phone number was left as a call back number.

## 2014-11-03 NOTE — CPEP Notes (Signed)
Night Shift Note: the patient appeared intermittently restless during the night, frequently changing her position, including, more than once,  moving her head to the other end of the bed, and then back again.    She remained in bed except to use the bathroom twice, once about about 0030, and again about 0515.  About 0520 she came into the common area.  She requested and received saltines and orange juice.  Afterwards she returned to her bed, and seemed to return to sleeping.    Q 15 minute safety and comfort checks maintained.

## 2014-11-03 NOTE — CPEP Notes (Signed)
CPEP EOB PROGRESS NOTE    Patient seen and evaluated by me today, 11/03/2014 at 9:30AM    9.40 Legal Status confirmed: yes    Final diagnoses:   Suicidal ideation   Normal IUP (intrauterine pregnancy) on prenatal ultrasound, unspecified trimester   Depressive disorder       Overnight Events: Nursing notes reviewed. No acute events overnight. Noted to be intermittently restless over night. Compliant with all medications.     Patient Concerns:  Jenna [name] reported that her sleep was "not great" due to "tossing and turning". She would not acknowledge that it could be secondary to anxiety regarding finding appropriate housing. However, did state that it was worrying her throughout the day. Patient was upset that she reportedly had a bed available but "they told me I need to spend another day here". She perseverated over her issues with housing and mentioned that she has been calling looking for housing each day since being on EOB. [name] that she preferred to "stick it out till Monday" because that would be when the sanction would be "dropped for my DSS worker". She was upset that apparently yesterday in the evening a bed was available but she was not informed/discharged. Other than the housing issues she denied SI/HI and stated that she had no additional complaints. Reported that her appetite has been good on the unit.     Most recent Vitals:  Patient Vitals for the past 24 hrs:   BP Temp Temp src Pulse SpO2   11/03/14 0746 130/68 37 C (98.6 F) TEMPORAL 87 100 %   11/02/14 1922 137/61 36.2 C (97.2 F) TEMPORAL 79 -        Psychiatric Assessments:    Mental Status Exam  Appearance: Disheveled  Relationship to Interviewer: Cooperative, Eye contact limited  Psychomotor Activity: Normal  Abnormal Movements: None  Muscle Strength and Tone: Normal  Station/Gait : Normal  Speech : Regular rate, Normal tone, Normal rhythm  Language: Fluent, Normal comprehension  Mood: Dysphoric  Affect: Irritable, Reactive  Thought Process:  Goal-directed, Perseveration  Thought Content: No suicidal ideation, No homicidal ideation, No delusions, No obsessions/compulsions  Perceptions/Associations : No hallucinations  Sensorium: Alert, Oriented x3  Cognition: Recent memory intact  Fund of Knowledge: Normal  Insight : Fair  Judgement: Fair    Pertinent Labs: All labs in the last 24 hours: No results found for this or any previous visit (from the past 24 hour(s)).    Assessment:   Jenna [name] is a 29 y.o. African American woman with a past psychiatric history of depression and substance use who presented to CPEP with worsening mood and suicidal ideation. She also found out that she is currently pregnant during her evaluation in the medical emergency room. She was admitted to EOB for further observation and to be started on medications. After discussing the risks and benefits of untreated depression vs her current medications on the fetus, Jenna [name] consents to continued treatment. At this time she is displaying improvements in her mood and no longer endorses suicidal ideation. She is working on finding emergency housing and is agreeable to discharge tomorrow if bed is available.    Plan:   1. Continue Prozac 10 mg PO daily  2. Continue Seroquel 50 mg PO nightly for sleep  3. Continue Penicillin v potassium 500 mg PO four (4) times daily for oral infection diagnosed in [facility] medical ED on 10/27/14 for 10 doses. (Started on 11/02/14 due to patient never starting the medication as  an outpatient)  4. Suicide precautions  5. Milieu therapy  6. PPD was read and is negative  7. May be discharged tomorrow if emergency housing available    Reasons for Continued Stay: Need for safe D/C    Author: Gearldine Bienenstock, DO  as of: 11/03/2014  at: 10:29 AM       Gearldine Bienenstock, DO  Resident  11/03/14 [phone]

## 2014-11-03 NOTE — CPEP Notes (Signed)
Pt PPD read, Left Forearm. Negative PPD. .

## 2014-11-03 NOTE — ED Notes (Signed)
CPEP-EOB Shift Assessment Note     Suicide observations:  Suicide Observations  Attempting or threatening suicide/self harm? : No  Danger to Self : No  Individualized Safety Plan: Yes (Comment)    Violence risk:  Violence  *Attempt to Harm: No  * Homicidal Ideation: No  *Homicidal ideation with intent: No  *Pt/Family Identified Calming  Techniques: Quiet time in room, PRN medications, Watch TV, Talk with staff, Frequent contact  *Staff  Identified Calming Techniques : Quiet time in room, PRN medications, Watch TV, Talk with staff    Mental Status Exam:   Mental Status Exam  Appearance: Disheveled  Relationship to Interviewer: Cooperative, Eye contact limited  Psychomotor Activity: Normal  Abnormal Movements: None  Muscle Strength and Tone: Normal  Station/Gait : Normal  Speech : Regular rate, Normal tone, Normal rhythm  Language: Fluent, Normal comprehension  Mood: Dysphoric  Affect: Irritable, Reactive  Thought Process: Goal-directed, Perseveration  Thought Content: No suicidal ideation, No homicidal ideation, No delusions, No obsessions/compulsions  Perceptions/Associations : No hallucinations  Sensorium: Alert, Oriented x3  Cognition: Recent memory intact  Fund of Knowledge: Normal  Insight : Fair  Judgement: Fair      Interventions:  Interventions: q15 minute monitoring maintained, Frequent supportive interations, Psych education regarding coping skills, Nutrition/hydration, Comfort measures, Medication administration, Education, Assessment of patient response to education    CIWA & COWS:   NA    VS:   Visit Vitals    BP 115/72 (BP Location: Left arm)    Pulse 100    Temp 36.8 C (98.2 F) (Temporal)    Resp 18    Ht 1.626 m ( )    Wt 127 kg (280 lb)    SpO2 100%    BMI 48.06 kg/m2       Pain assessment:  Last Nursing documented pain:  0-10 Scale: 5 (11/03/14 2035)      PRN medications administered: Yes Tylenol, one time dose of Hydroxyzine    RN Comments: Pt stayed in bed for most of afternoon.   Excellent PO intake with 2 dinners and multiple snacks.  Pt approached writer around 7:30 pm asking to be discharged.  Writer talked with her in her room, and patient reported that she is craving a cigarette and home cooked food- her family was having a party tonight and she wanted to be there rather than here.  Writer empathized, and explained that she could have a bed tomorrow at a place that would really help her and her baby.  Patient agreed, stated that she is feeling "really emotional" and began to cry.  Writer encouraged patient to come out to the lounge, enjoy some ice cream, and watch some TV.  Pt given 1 time dosage of hydroxyzine, and she calmed shortly after it was given.   Walk-in clinic information for CD treatment and mental health provided within discharge information.  15 minute safety checks maintained, will continue to monitor.      Burgess Amor, RN, 11:21 PM

## 2014-11-04 NOTE — ED Notes (Signed)
CPEP-EOB Shift Assessment Note     Suicide observations:  Suicide Observations  Attempting or threatening suicide/self harm? : No  Danger to Self : No  Individualized Safety Plan: Yes (Comment)    Violence risk:  Violence  *Attempt to Harm: No  * Homicidal Ideation: No  *Homicidal ideation with intent: No  *Pt/Family Identified Calming  Techniques: Watch TV, Quiet time in room  *Staff  Identified Calming Techniques : Quiet time in room, Watch TV    Mental Status Exam:   Mental Status Exam  Appearance: Groomed, Appropriately dressed  Relationship to Interviewer: Cooperative  Psychomotor Activity: Normal  Abnormal Movements: None  Muscle Strength and Tone: Normal  Station/Gait : Normal  Speech : Regular rate, Normal tone, Normal rhythm  Language: Fluent, Normal comprehension  Mood: Euthymic  Affect: Appropriate  Thought Process: Logical, Sequential, Goal-directed  Thought Content: No suicidal ideation, No homicidal ideation, No delusions, No obsessions/compulsions  Perceptions/Associations : Visual hallucinations  Sensorium: Alert, Oriented x3  Cognition: Recent memory intact, Fair attention span  [employer] of Knowledge: Normal  Insight : Fair  Judgement: Inadequate      Interventions:  Interventions: q15 minute monitoring maintained, Frequent supportive interations, Psych education regarding coping skills, Nutrition/hydration, Comfort measures, Medication administration, Assessment of patient response to education, Education, Safety planning    CIWA & COWS:   NA    VS:   Visit Vitals    BP 112/52 (BP Location: Right arm)    Pulse 100    Temp 36.6 C (97.9 F) (Temporal)    Resp 18    Ht 1.626 m ( )    Wt 127 kg (280 lb)    SpO2 100%    BMI 48.06 kg/m2       Pain assessment:  Last Nursing documented pain:  0-10 Scale: 6 (11/04/14 0731)      PRN medications administered: No    RN Comments: Pt. was discharged with all belongings, via [place] taxi to DSS at [employer] [employer] [employer]. Pt. had hoped for a program for pregnant  women, but no beds are currently available. Pt. Was provided with phone numbers for programs in [place] and [place]. On discharge, appointments were made for [facility] and for orthopedics. Pt. had missed several appointments;  Need to ensure she keep the appointments or call, as likelihood of clinics no longer seeing her for no-shows was reinforced.  Pt. Denied all self and other injurious ideation. She continues to c/o vaginal discharge, treated with Micatin vaginal cream. Pt c/o toothache, was informed of walk-in hours at [facility].     Ralph Leyden, RN, 12:46 PM

## 2014-11-04 NOTE — Discharge Instructions (Signed)
CPEP Discharge Instructions    Discharge Date: 11/04/14   Discharge Time:  12:00 PM    Follow-ups:  Appointment With: [facility]        Phone: [phone]  Date: Monday, August 29th 2016  Time: 10:00 AM  Location/Instructions: [address]                                      [place], [place] [address]    Appointment With: Chemical Dependency Intake  Phone: [phone]  Fax: [phone]   Date: Wednesday, September 7th 2016  Time: 10:00 AM  Location/Instructions: [facility]                                      [address]                                      [place], [place] [address]    Appointment With: Dr. Andee Poles, MD, orthopedics  Phone: [phone]  Date:Monday, September 15th 2016  Time: 2:40 PM  Location/Instructions: [place]                                      [facility]                                      [address] [place], [place] [address]                                         RESIDENTIAL PLACEMENTS:   -  [facility]       Andria Frames, [phone]     - [address] [facility]     Intake Coord, [phone]    Level of outreach indicated if patient fails new intake or COPS (Comprehensive Outpatient Psychiatric Service) appointment: Routine Program Follow-up    When to call for help:    Call your psychiatric outpatient provider if experiencing any of these symptoms: increased irritability, sleep changes, appetite changes, energy changes, thoughts to harm yourself or others, anxiety, fear, auditory or visual hallucinations.  Lifeline Helpline (24 hours/7 days) [phone] Consulting civil engineer)  Mobile Crisis team: [phone]    General Instructions:  Other written information given to the patient: Yes Safety Plan provided and completed  Return to Work/School on: N/A    ADULTS (29 y/o+)  Comprehensive Outpatient Psychiatric Services (COPS)/Priority Access:  Walk  In Options    Instructions: Please contact ONE of the providers listed below and inform them you were discharged from CPEP and needs a COPS/Open Access Appointment to begin the intake process.  Please plan your time accordingly.  Average intake time is 2 hours to complete.    Please  bring your insurance information to the intake appointment.    [facility]  [address] [place], [place] [address]  Phone:  [phone]  WALK IN HOURS:  Tuesdays, Wednesdays, and Thursdays:  9am - 11am    [facility]  [address]  [place] [address]  Phone:  [phone]  WALK IN HOURS:  Tuesday and Thursdays:  8:30am - 12:30pm      [facility]  [address]  [place], [place] [address]  Phone:  [phone]  WALK IN HOURS:  Monday - Thursday:  9am - 3pm  Friday:  9am - 12pm    [facility]  [address]  [place], [place] [address]  Phone:  [phone]  WALK IN HOURS:  Wednesdays and Thursdays:  8:30am - 9:30am    ----------------------------------------------------------------------------------------------------------------------------------------------------------    Chemical Dependency Resources           DETOX FACILITIES:    [facility] Economist)  [address] (6th floor) [place] [address] [phone]     [facility]   [address] [place] [address] [phone]     [facility] (VA or Straight Medicaid only)  [address] [place] [address] [phone]     [facility], Alcohol Only)  [address] [place] [address] [phone]   [facility] - Outpatient Only, Alcohol Only)  [address] [place] [address] [phone]   [name]  [address] [place] [address] [phone]       INPATIENT PROGRAMS:    [facility]   [address] [place] [address] Dorothy Spark Building) [phone]   Katherine Roan  [address] [place] [address] [phone] option 3     [facility]  [address] [place] [address]  (Facility is affiliated with [employer]. Referral must be sent to [facility] who will then forward information to this location.) [phone]     [facility] [place])  [address] [place] [address] [phone]     [facility] [place]) - [facility] Program-   [address] [place] [address]  Facility is affiliated with [facility]. Referral must be sent to [facility] who will then forward information to this location.) [phone]     [facility]   [address] [place] [address] (located on [facility]) [phone]      [facility])  [address] [place] [address] [phone]      Holiday representative ([place])   [address] [place] [address] [phone]       OUTPATIENT PROGRAMS:    [facility] - Comprehensive Alcoholism  [address] [place] [address] [phone]   [facility] (Restart)  [address] [place] [address] [phone]     [facility] Outpatient Chemical Dependency  [address] [place] [address] [address] Spark [address])  [address] [place] [address] [phone]  [phone]     [facility] (Monday- Thursday)  [address] [place] [address] [phone] ext [phone]  [address] [place] [address]  [address]  [facility] [place] [address] (Spanish speaking available)  [phone] [place] [id number] [phone]  (indicate location when calling main intake line)   Melissa Noon (Hispanic Outreach) - [facility] Restart  [address] [place] [address] [phone]     [facility])  [address] [place] [address] [place] [address] [phone]     [facility])  [address] [place] [address] [phone]     [facility]  [address] [place] [address] [phone]     [facility]  Dependency   [address] [place] [address]   [address] [place] [address]    [address] [place] [address] Jenelle Mages)  [phone]  (indicate location when calling main intake line)   [facility]  [address] [place] [address] [phone]       ADDITIONAL RESOURCES:    Al-Anon/Al-Teen [phone]   Alcohol Abuse & Addiction Information [phone]   Alcohol Anonymous [phone]   Drug Abuse Information Line [phone]   Drug and Alcohol Council [phone]   LifeLine [phone] or [address] Council on Alcohol & Substance Abuse Lenor Derrick) [phone]   Narcotics Anonymous Hotline 9514081040   [facility] [phone] ext [phone]     ----------------------------------------------------------------------------------------------------------------------------------------------------------    Emergency Shelter - Business Hours    Resource number: 24235361  A program of: [facility] Department of Human Services  Description    * Provides emergency housing placement in shelters throughout the county during non-business hours; available to DSS and non-DSS recipients.    Temporary message: The only time the phone number for Emergency Shelter placements from 3PM-5PM should be utilized is if the caller did not apply in-person today at [facility] for emergency shelter. All other callers should be directed to call us back after 5pm for assistance with finding shelter.    Phone Numbers  Service/Intake  [phone]    Service/Intake  (private)     Emergency Shelter placements from     3PM-5PM    Website   http://horn.org/    Email    mchhs@[place] .gov    Site Information  Location         Description of           Location  Mailing Address        Disabilities           Access  Hours    M-F 9:00AM-5:00PM.    Occupational hygienist           [employer] and other)      Offered at the following Site(s), listed after this Program. Note that Program details may differ from Site to Site: [facility] [place] [facility] [employer]. [employer] Information  Eligibility    Currently in [place].  Midwife available upon request.  Payment Options  Fees     None.  Application Process   M-F 8AM-3PM: Active or pending case with DHS should                                                  contact their worker. If Non-DHS recipient then fill out an  application and bring to [address] Applications                                              [address] are available on-site, online, or by mail.      M-F 3PM-5PM: call the service/intake number listed to                                                  speak with a DHS worker to see if the individual is eligible                                                  for placement.  Documents Required  Normal Wait Time  Capacity  Volunteer Opportunities  No      Emergency Shelter - After Hours    Resource number: 16109604  A program of: [facility] Department of [employer]  Description    * Provides emergency housing placement in shelters throughout the county during non-business hours; available to DSS and non-DSS recipients.    Phone Numbers  After Hours   [phone]    Unpublished   (private)     Angelique Blonder Read - 2-1-1/LIFE LINE USE ONLY    Website   [email or web address].org/    Email    mchhs@[place] .gov    Site Information  Location         Description of           Location  Mailing Address        Disabilities           Access  Hours    M-F 5:00PM-7:00AM.    Public Transport     SA, SU, and Holiday's 24 hours.  (Bus and other)      Offered at the following Site(s), listed after this Program. Note that Program details may differ from Site to Site: [facility] [employer]. [employer]    Program Information  Eligibility    Must be located in [facility], does not need                                                  residency in [place].  Midwife available upon request.  Payment Options  Fees     None.  Application Process   Phone screening required.  Documents Required  Normal Wait Time  Set designer  No      Phone Numbers  Main   [phone]    [facility] and Dorena Dew.    Website  [email or web address]    Email   [email or web address] .[email or web address] developer Information  Location  [address] [place], [place],   Description of    [id number]       Location  Mailing Address       Disabilities  Access  Hours   M-F 8:00AM-5:00PM.                Barrister's clerk           [employer] and other)          The above information has been discussed with me and I have received a copy.  I understand that I am advised to follow the instructions given to me to appropriately care for my condition.

## 2014-11-04 NOTE — CPEP Notes (Signed)
CPEP EOB PROGRESS NOTE    Patient seen and evaluated by me today, 11/04/2014 at 0900    9.40 Legal Status confirmed: yes    Final diagnoses:   Suicidal ideation   Normal IUP (intrauterine pregnancy) on prenatal ultrasound, unspecified trimester   Depressive disorder         Patient Concerns:  Wondering what her dispo plan is     Overnight Events: unremarkable    Most recent Vitals:  Patient Vitals for the past 24 hrs:   BP Temp Temp src Pulse Resp SpO2   11/04/14 0731 112/52 36.6 C (97.9 F) TEMPORAL - 18 100 %   11/03/14 1915 115/72 36.8 C (98.2 F) TEMPORAL 100 - 100 %        Psychiatric Assessments:    Mental Status Exam  Appearance: Groomed, Appropriately dressed  Relationship to Interviewer: Cooperative  Psychomotor Activity: Normal  Abnormal Movements: None  Muscle Strength and Tone: Normal  Station/Gait : Normal  Speech : Regular rate, Normal tone, Normal rhythm  Language: Fluent, Normal comprehension  Mood: Euthymic  Affect: Appropriate  Thought Process: Logical, Sequential, Goal-directed  Thought Content: No suicidal ideation, No homicidal ideation, No delusions, No obsessions/compulsions  Perceptions/Associations : Visual hallucinations  Sensorium: Alert, Oriented x3  Cognition: Recent memory intact, Fair attention span  [employer] of Knowledge: Normal  Insight : Fair  Judgement: Inadequate    Recent Results (from the past 1344 hour(s))   CBC and differential    Collection Time: 10/12/14  9:21 PM   Result Value Ref Range    WBC 8.4 4.0 - 10.0 THOU/uL    RBC 3.6 (L) 3.9 - 5.2 MIL/uL    Hemoglobin 7.7 (L) 11.2 - 15.7 g/dL    Hematocrit 28 (L) 34 - 45 %    MCV 78 (L) 79 - 95 fL    MCH 22 (L) 26 - 32 pg/cell    MCHC 28 (L) 32 - 36 g/dL    RDW 17.0 (H) 11.7 - 14.4 %    Platelets 351 160 - 370 THOU/uL    Seg Neut % 50.6 %    Lymphocyte % 33.3 %    Monocyte % 8.8 %    Eosinophil % 6.4 %    Basophil % 0.7 %    Neut # K/uL 4.2 1.6 - 6.1 THOU/uL    Lymph # K/uL 2.8 1.2 - 3.7 THOU/uL    Mono # K/uL 0.7 0.2 - 0.9 THOU/uL     Eos # K/uL 0.5 (H) 0.0 - 0.4 THOU/uL    Baso # K/uL 0.1 0.0 - 0.1 THOU/uL    Nucl RBC % 0.0 0.0 - 0.2 /100 WBC    Nucl RBC # K/uL 0.0 0.0 - 0.0 THOU/uL    IMM Granulocytes # 0.0 0.0 - 0.1 THOU/uL    IMM Granulocytes 0.2 %   Plasma profile [facility] only)    Collection Time: 10/12/14  9:21 PM   Result Value Ref Range    Chloride,Plasma 105 96 - 108 mmol/L    CO2,Plasma 23 20 - 28 mmol/L    Potassium,Plasma 3.9 3.4 - 4.7 mmol/L    Sodium,Plasma 143 133 - 145 mmol/L    Anion Gap,PL 15 7 - 16    UN,Plasma 24 (H) 6 - 20 mg/dL    Creatinine 1.07 (H) 0.51 - 0.95 mg/dL    GFR,Caucasian 70 *    GFR,Black 81 *    Glucose,Plasma 101 (H) 60 - 99 mg/dL  RUQ panel    Collection Time: 10/12/14  9:21 PM   Result Value Ref Range    Amylase 59 28 - 100 U/L    Lipase 27 13 - 60 U/L    Total Protein 6.8 6.3 - 7.7 g/dL    Albumin 4.0 3.5 - 5.2 g/dL    Bilirubin,Total <0.2 0.0 - 1.2 mg/dL    Bilirubin,Direct <0.2 0.0 - 0.3 mg/dL    Alk Phos 81 35 - 105 U/L    AST 20 0 - 35 U/L    ALT 17 0 - 35 U/L   BHCG, serum    Collection Time: 10/12/14  9:21 PM   Result Value Ref Range    BHCG <1 0 - 1 mIU/mL   Progesterone    Collection Time: 10/12/14  9:21 PM   Result Value Ref Range    Progesterone 11.4 ng/mL   Urinalysis with Microscopic UA    Collection Time: 10/13/14 12:19 AM   Result Value Ref Range    Color, UA Yellow Yellow    Appearance,UR 1+ Cloudy (A) Clear    Specific Gravity,UA 1.034 (H) 1.002 - 1.030    Leuk Esterase,UA TRACE (A) NEGATIVE    Nitrite,UA NEG NEGATIVE    pH,UA 5.0 5.0 - 8.0    Protein,UA NEG NEGATIVE mg/dL    Glucose,UA NORM mg/dL    Ketones, UA NEG NEGATIVE    Blood,UA NEG NEGATIVE    RBC,UA None Seen 0 - 2 /hpf    WBC,UA 2 0 - 5 /hpf    Bacteria,UA 1+     Squam Epithel,UA 4+ (A) 0-1+    Amorphous,UA Present    Bacterial urine culture    Collection Time: 10/13/14 12:19 AM   Result Value Ref Range    Aerobic Culture .    CBC and differential    Collection Time: 10/30/14  4:50 PM   Result Value Ref Range    WBC 12.4  (H) 4.0 - 10.0 THOU/uL    RBC 3.7 (L) 3.9 - 5.2 MIL/uL    Hemoglobin 7.8 (L) 11.2 - 15.7 g/dL    Hematocrit 28 (L) 34 - 45 %    MCV 76 (L) 79 - 95 fL    MCH 21 (L) 26 - 32 pg/cell    MCHC 28 (L) 32 - 36 g/dL    RDW 17.9 (H) 11.7 - 14.4 %    Platelets 404 (H) 160 - 370 THOU/uL    Seg Neut % 68.3 %    Lymphocyte % 19.9 %    Monocyte % 8.7 %    Eosinophil % 2.3 %    Basophil % 0.5 %    Neut # K/uL 8.5 (H) 1.6 - 6.1 THOU/uL    Lymph # K/uL 2.5 1.2 - 3.7 THOU/uL    Mono # K/uL 1.1 (H) 0.2 - 0.9 THOU/uL    Eos # K/uL 0.3 0.0 - 0.4 THOU/uL    Baso # K/uL 0.1 0.0 - 0.1 THOU/uL    Nucl RBC % 0.0 0.0 - 0.2 /100 WBC    Nucl RBC # K/uL 0.0 0.0 - 0.0 THOU/uL    IMM Granulocytes # 0.0 0.0 - 0.1 THOU/uL    IMM Granulocytes 0.3 %   Basic metabolic panel    Collection Time: 10/30/14  4:50 PM   Result Value Ref Range    Glucose 112 (H) 60 - 99 mg/dL    Sodium 140 133 - 145 mmol/L    Potassium 4.2  3.3 - 5.1 mmol/L    Chloride 101 96 - 108 mmol/L    CO2 22 20 - 28 mmol/L    Anion Gap 17 (H) 7 - 16    UN 16 6 - 20 mg/dL    Creatinine 0.88 0.51 - 0.95 mg/dL    GFR,Caucasian 89 *    GFR,Black 103 *    Calcium 9.3 8.8 - 10.2 mg/dL   Pregnancy Test, Serum    Collection Time: 10/30/14  4:50 PM   Result Value Ref Range    Preg,Serum POS (A) NEGATIVE   Ethanol    Collection Time: 10/30/14  4:50 PM   Result Value Ref Range    Ethanol <10 mg/dL   Salicylate level    Collection Time: 10/30/14  4:50 PM   Result Value Ref Range    Salicylate <6.2 (L) 69.4 - 30.0 mg/dL   Acetaminophen level    Collection Time: 10/30/14  4:50 PM   Result Value Ref Range    Acetaminophen <2 ug/mL   BHCG,serum (Strong/[facility])    Collection Time: 10/30/14  4:50 PM   Result Value Ref Range    BHCG 6926 (H) 0 - 1 mIU/mL   Chlamydia plasmid DNA amplification    Collection Time: 10/30/14 10:40 PM   Result Value Ref Range    Chlamydia Plasmid DNA Amplification .      *Note: Due to a large number of results and/or encounters for the requested time period, some results  have not been displayed. A complete set of results can be found in Results Review.         CIWA Score:    Opiate Withdrawal Score:       Assessment: Pt reports she slept well last night and has no disturbance of appetite. Her mood is "okay" and affect is congruent with this report. She denies SI/HI/AVH and is ready to move into the next phase of care once it is identified. She is compliant with medications and scripts have already been filled for when she is discharged. She denies side effects from her medications and has no physical complaints.     Pt's was ready for discharge over the weekend however her discharged was significantly delayed due to program/system communication problems and she has tolerated this frustration well. She is pleasant when discussing next steps and accepting when informed she would be discharged to DSS as a bed is not available today at any of the facilities referrals were sent to. At her request she was given a note confirming she is currently pregnant to take with her to DSS. She has an appointment this week with [facility] for further OBGYN care.     Plan:   - d/c today    Reasons for Continued Stay: None    Author: Wynona Dove, NP  as of: 11/04/2014  at: 3:34 PM     Wynona Dove, NP  11/04/14 [phone]

## 2014-11-04 NOTE — CPEP Notes (Signed)
Night shift note. Patient was asleep upon writer's arrival on unit and continued to sleep in NAD, with regular, unlabored breathing. Patient did wake up and request multiple snacks during the night; at approximately 0100 requested and received cereal, and at approximately 0400 requested and received crackers, peanut butter, and juice. Patient appeared to quickly return to sleep and currently remains asleep. Will continue to monitor and maintain q15 minute safety checks.

## 2014-11-04 NOTE — CPEP Notes (Signed)
Pt requesting a note to take to DSS indicating she is pregnant; pt given signed note on letterhead indicating this information which is confirmed via lab testing and ultrasound. Pt reviewed note and agrees it is acceptable for her needs.          Doroteo Glassman, NP  11/04/14 1230

## 2014-11-04 NOTE — Comprehensive Assessment (Signed)
11/04/14 1146   Disposition details   Patient is being discharged to community   Family notification not done-no family/no family involvement   Outpatient provider notification of disposition patient has no outpatient provider   Living situation: notification of disposition via phone   Transportation arranged via cab (voucher);via bus-passes given   Patient belongings  given to patient   Medications given to patient   Valuables given to patient

## 2014-11-04 NOTE — CPEP Notes (Signed)
Clinical Evaluator Progress Note    Contact with Pt:   - Pt is a 28yo African-American single female with a pphx of depression who presented for evaluation on 10/31/14 identifying suicidal ideations, chemical dependency, and depression as her main concerns at this time. Today, pt presented with a euthymic mood and congruent affect in agreement for her discharge to DSS for emergency housing. Pt informed that she will be provided contact information for her women's residence's and encouraged to follow up with a phone call for placement after her discharge. Pt was in agreement with plan and is ready to leave today.    Collateral Contacts:  Professional  [facility]  [phone]   - Message left with intake coord requesting call back regarding placement    Andria Frames  [facility]  [phone]   - Message left with intake coord requesting call back regarding placement      Intervention(s)/Plan:   - Residential Referrals:     - [facility]     - [facility] [place]   - [facility] Nurse Visit, Ob/gyn and Mental Health, 11/11/14 @ 10:00 AM   - Huther Doyle Intake, 11/20/14 @ 10:00 AM   - Pt discharged to DSS for emergency housing

## 2014-11-04 NOTE — Comprehensive Assessment (Signed)
11/04/14 1130   CPEP Summary of Services   Safety precautions implemented personal belongings secured;security metal scanning completed   Case consultation/discussion involving attending;registered nurse;social worker;nurse practitioner   Crisis intervention and safety planning involving verbal intervention;safety plan #1 developed (comment);access to firearms discussed and endorsed   Collateral information obtained from other (comment)   Referral offered, accepted and completed for  Outpatient Mental Health services;Outpatient Chemical Dependency services;medical care follow-up;emergency housing;other (comment)  ([facility] faxed)   Referral(s) offered but declined by patient for No referrals declined

## 2014-11-11 ENCOUNTER — Ambulatory Visit: Payer: Self-pay

## 2014-11-12 ENCOUNTER — Emergency Department
Admission: EM | Admit: 2014-11-12 | Discharge status: Against Medical Advice | Payer: Self-pay | Source: Ambulatory Visit

## 2014-11-12 LAB — PLASMA PROF 7 (ED ONLY)
Anion Gap,PL: 17 — ABNORMAL HIGH (ref 7–16)
CO2,Plasma: 21 mmol/L (ref 20–28)
Chloride,Plasma: 102 mmol/L (ref 96–108)
Creatinine: 1.01 mg/dL — ABNORMAL HIGH (ref 0.51–0.95)
GFR,Black: 87 *
GFR,Caucasian: 75 *
Glucose,Plasma: 104 mg/dL — ABNORMAL HIGH (ref 60–99)
Potassium,Plasma: 3.7 mmol/L (ref 3.4–4.7)
Sodium,Plasma: 140 mmol/L (ref 133–145)
UN,Plasma: 15 mg/dL (ref 6–20)

## 2014-11-12 LAB — CBC AND DIFFERENTIAL
Baso # K/uL: 0.1 10*3/uL (ref 0.0–0.1)
Basophil %: 0.4 %
Eos # K/uL: 0.1 10*3/uL (ref 0.0–0.4)
Eosinophil %: 0.9 %
Hematocrit: 28 % — ABNORMAL LOW (ref 34–45)
Hemoglobin: 8 g/dL — ABNORMAL LOW (ref 11.2–15.7)
IMM Granulocytes #: 0 10*3/uL (ref 0.0–0.1)
IMM Granulocytes: 0.3 %
Lymph # K/uL: 2.4 10*3/uL (ref 1.2–3.7)
Lymphocyte %: 20.5 %
MCH: 22 pg/cell — ABNORMAL LOW (ref 26–32)
MCHC: 29 g/dL — ABNORMAL LOW (ref 32–36)
MCV: 77 fL — ABNORMAL LOW (ref 79–95)
Mono # K/uL: 0.8 10*3/uL (ref 0.2–0.9)
Monocyte %: 7 %
Neut # K/uL: 8.4 10*3/uL — ABNORMAL HIGH (ref 1.6–6.1)
Nucl RBC # K/uL: 0 10*3/uL (ref 0.0–0.0)
Nucl RBC %: 0 /100 WBC (ref 0.0–0.2)
Platelets: 368 10*3/uL (ref 160–370)
RBC: 3.6 MIL/uL — ABNORMAL LOW (ref 3.9–5.2)
RDW: 19.7 % — ABNORMAL HIGH (ref 11.7–14.4)
Seg Neut %: 70.9 %
WBC: 11.9 10*3/uL — ABNORMAL HIGH (ref 4.0–10.0)

## 2014-11-12 LAB — RUQ PANEL (ED ONLY)
ALT: 16 U/L (ref 0–35)
AST: 18 U/L (ref 0–35)
Albumin: 4.2 g/dL (ref 3.5–5.2)
Alk Phos: 81 U/L (ref 35–105)
Amylase: 43 U/L (ref 28–100)
Bilirubin,Direct: <0.2 mg/dL (ref 0.0–0.3)
Bilirubin,Total: 0.3 mg/dL (ref 0.0–1.2)
Lipase: 27 U/L (ref 13–60)
Total Protein: 7.2 g/dL (ref 6.3–7.7)

## 2014-11-12 LAB — BHCG, QUANT PREGNANCY: BHCG, QUANT PREGNANCY: 13385 m[IU]/mL — ABNORMAL HIGH (ref 0–1)

## 2014-11-12 LAB — TYPE AND SCREEN
ABO RH Blood Type: A POS
Antibody Screen: NEGATIVE

## 2014-11-12 LAB — PROGESTERONE: Progesterone: 5.4 ng/mL

## 2014-11-12 NOTE — ED Triage Notes (Signed)
Vaginal bleeding for about 30 minutes PTA, LMP 09/05/14 with + home pregnancy test. States she last used cocaine 1 day ago.  Has 4 living children at home with a total of 7 pregnancies.        Triage Note   Jobe Gibbon, RN

## 2014-11-12 NOTE — First Provider Contact (Signed)
ED Medical Screening Exam Note    Initial provider evaluation performed by   ED First Provider Contact     None        Pt is a 29 y.o. black female about [redacted] weeks pregnant missed her OB appt today, pt is having vaginal bleeding about ago, last use of cocaine x 2 days   Vital signs reviewed.    Orders placed:  LABS     Patient requires further evaluation.     Alba Cory, PA, 11/12/2014, 3:42 PM    Supervising physician Dr. Janae Bridgeman  was immediately available     Alba Cory, [name]  11/12/14 1544

## 2014-11-16 ENCOUNTER — Encounter: Payer: Self-pay | Admitting: Family

## 2014-11-16 ENCOUNTER — Emergency Department
Admission: EM | Admit: 2014-11-16 | Disposition: A | Discharge status: Home / Self Care | Payer: Self-pay | Source: Ambulatory Visit

## 2014-11-16 DIAGNOSIS — R109 Unspecified abdominal pain: Secondary | ICD-10-CM | POA: Diagnosis present

## 2014-11-16 LAB — CBC AND DIFFERENTIAL
Baso # K/uL: 0 10*3/uL (ref 0.0–0.1)
Basophil %: 0.3 %
Eos # K/uL: 0.1 10*3/uL (ref 0.0–0.4)
Eosinophil %: 0.5 %
Hematocrit: 28 % — ABNORMAL LOW (ref 34–45)
Hemoglobin: 8.2 g/dL — ABNORMAL LOW (ref 11.2–15.7)
IMM Granulocytes #: 0.1 10*3/uL (ref 0.0–0.1)
IMM Granulocytes: 0.5 %
Lymph # K/uL: 1.4 10*3/uL (ref 1.2–3.7)
Lymphocyte %: 9.8 %
MCH: 23 pg/cell — ABNORMAL LOW (ref 26–32)
MCHC: 29 g/dL — ABNORMAL LOW (ref 32–36)
MCV: 78 fL — ABNORMAL LOW (ref 79–95)
Mono # K/uL: 0.9 10*3/uL (ref 0.2–0.9)
Monocyte %: 5.9 %
Neut # K/uL: 12.1 10*3/uL — ABNORMAL HIGH (ref 1.6–6.1)
Nucl RBC # K/uL: 0 10*3/uL (ref 0.0–0.0)
Nucl RBC %: 0 /100 WBC (ref 0.0–0.2)
RBC: 3.7 MIL/uL — ABNORMAL LOW (ref 3.9–5.2)
RDW: 20 % — ABNORMAL HIGH (ref 11.7–14.4)
Seg Neut %: 83 %
WBC: 14.6 10*3/uL — ABNORMAL HIGH (ref 4.0–10.0)

## 2014-11-16 LAB — PLASMA PROF 7 (ED ONLY)
Anion Gap,PL: 16 (ref 7–16)
CO2,Plasma: 21 mmol/L (ref 20–28)
Chloride,Plasma: 100 mmol/L (ref 96–108)
Creatinine: 0.89 mg/dL (ref 0.51–0.95)
GFR,Black: 101 *
GFR,Caucasian: 88 *
Glucose,Plasma: 115 mg/dL — ABNORMAL HIGH (ref 60–99)
Potassium,Plasma: 4.1 mmol/L (ref 3.4–4.7)
Sodium,Plasma: 137 mmol/L (ref 133–145)
UN,Plasma: 14 mg/dL (ref 6–20)

## 2014-11-16 LAB — BHCG, QUANT PREGNANCY: BHCG, QUANT PREGNANCY: 744 m[IU]/mL — ABNORMAL HIGH (ref 0–1)

## 2014-11-16 LAB — PROGESTERONE: Progesterone: 0.9 ng/mL

## 2014-11-16 LAB — HOLD BLUE

## 2014-11-16 LAB — TYPE AND SCREEN
ABO RH Blood Type: A POS
Antibody Screen: NEGATIVE

## 2014-11-16 MED ORDER — METRONIDAZOLE 500 MG PO TABS *I*
500.0000 mg | ORAL_TABLET | Freq: Two times a day (BID) | ORAL | 0 refills | Status: AC
Start: 2014-11-16 — End: 2014-11-23

## 2014-11-16 MED ORDER — ACETAMINOPHEN 500 MG PO TABS *I*
500.0000 mg | ORAL_TABLET | Freq: Four times a day (QID) | ORAL | 0 refills | Status: AC | PRN
Start: 2014-11-16 — End: 2014-12-16

## 2014-11-16 MED ORDER — DOXYCYCLINE HYCLATE 100 MG PO CAPS *I*
100.0000 mg | ORAL_CAPSULE | Freq: Two times a day (BID) | ORAL | 0 refills | Status: AC
Start: 2014-11-16 — End: 2014-11-23

## 2014-11-16 NOTE — ED Notes (Signed)
Patient ambulating with steady gait and tolerating PO fluids. Patient denies dizziness. Discharge instructions reviewed, prescription reviewed. Patient verbalized understanding of discharge and follow up care. Patient denies further questions at this time.
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
## 2014-11-16 NOTE — ED Notes (Signed)
Patient comfortable at this time. Ambulating independently, eating sandwich and crackers. Denies further needs at this time.

## 2014-11-16 NOTE — ED Notes (Signed)
See triage note.

## 2014-11-16 NOTE — First Provider Contact (Signed)
ED Medical Screening Exam Note    Initial provider evaluation performed by   ED First Provider Contact     Date/Time Event User Comments    11/16/14 1044 ED Provider First Contact Taishawn Smaldone Initial Face to Face Provider Contact        29 year old female with a hx of depression, polysubstance abuse who presents with vaginal bleeding and cramping.  Thinks she was about [redacted] weeks pregnant.  Came to ED a few days ago for similar, left without being seen (did have her labs done).  No [name] during pregnancy.    Vital signs reviewed.    Orders placed:  LABS     Patient requires further evaluation.     Raymond Bhardwaj, PA, 11/16/2014, 10:55 AM    Supervising physician May was immediately available     Judi Saa, [name]  11/16/14 1056

## 2014-11-16 NOTE — Discharge Instructions (Signed)
You were seen in the emergency department for vaginal bleeding and abdominal cramping. You had a confirmed pregnancy a couple of weeks ago, however your BHCG levels have been decreasing and you have been bleeding with some clots. You are likely having a miscarriage. It is very important that you follow up with your OBGYN early next week. Please contact them for an appointment, if you do not have an OBGYN, the [facility] phone number was provided to you, please contact them with an appointment. You had STD cultures done as well, these results may take a couple of days. You requested prophylactic treatment, for STDs and BV as you never completed your previous BV treatment. It is very important that you complete the entire course of antibiotics.     New medications:  Doxycycline  by mouth twice daily x 7 days  Flagyl 500 mg by mouth twice daily x 7 days  You can take tylenol  every 6 hours as needed for pain/cramping    Instructions:  You are scheduled to start treatment with [facility], please do not miss this appointment. Avoid using drugs an alcohol.   - Please follow up with all appointments. Contact your OBGYN for an appointment this week.  - Please take medications as instructed.   - It is very important that upon discharge you continue to eat a well balanced diet, and drink plenty of fluids to help maintain your nutrition/hydration status.   - You should also continue to participate in activity as tolerated to help maintain your strength.   - It is important to practice proper hand hygiene, washing hands with soap and water before and after meals and after using the bathroom. This will help prevent the spread of infection.    Return to the emergency department with increased vaginal bleeding, worsening pain, chest pain, shortness of breath or persistent fevers or chills.

## 2014-11-16 NOTE — ED Triage Notes (Signed)
C/o abdominal pain. States 3 days ago started to bleed and was told she had a miscarriage. States she was 3 month pregnant. Seen here and d/c'd. Now with abdominal cramping and passing clots.        Triage Note   Claria Dice, RN

## 2014-11-17 LAB — VAGINITIS SCREEN: DNA PROBE: Vaginitis Screen:DNA Probe: POSITIVE

## 2014-11-18 LAB — N. GONORRHOEAE DNA AMPLIFICATION: N. gonorrhoeae DNA Amplification: 0

## 2014-11-18 LAB — CHLAMYDIA PLASMID DNA AMPLIFICATION: Chlamydia Plasmid DNA Amplification: 0

## 2014-11-20 ENCOUNTER — Ambulatory Visit: Payer: Self-pay

## 2014-11-20 ENCOUNTER — Telehealth: Payer: Self-pay

## 2014-11-20 NOTE — Telephone Encounter (Signed)
Pt has scheduled NPI for this morning.  Per ED notes LMP of 09/05/2014.  Pt has been seen several times in ED,most recent visit was on 11/16/2014 for c/o abd pain and vaginal bleeding with clots.  BHCG's,10/30/2014,6,926.  11/12/2014,13,385.  11/16/2014,744.  Chart reviewed with J.Desimone RN,advised to contact pt to cancel this morning's NPI,and schedule ED f/u,determination of pregnancy,with provider,this week.  Called mobile [phone] been d/c.  Called home [phone] connects then goes to a busy signal.  Message fwd to phone nurse for further f/u.

## 2014-11-21 NOTE — Telephone Encounter (Signed)
Repeat attempts made to reach patient - her own contact numbers are non-functioning, but a voicemail is left for emergency contact asking for assistance with reach [place].

## 2014-11-22 NOTE — Telephone Encounter (Signed)
Both phones listed are non- functioning.

## 2014-11-22 NOTE — Telephone Encounter (Signed)
LM with cousin who is listed as emergency contact who will attempt to contact her.

## 2014-11-22 NOTE — Telephone Encounter (Signed)
Unable to contact pt- will forward to [facility] to see if pt is in the beta book

## 2014-11-25 NOTE — Telephone Encounter (Signed)
Unable to contact letter sent

## 2014-11-27 NOTE — Telephone Encounter (Signed)
Patient canceled 11-11-14 NPI and no-showed 11-20-14 NPI

## 2014-11-28 ENCOUNTER — Other Ambulatory Visit: Payer: Self-pay | Admitting: Orthopedic Surgery

## 2014-11-28 ENCOUNTER — Ambulatory Visit: Payer: Self-pay | Admitting: Orthopedic Surgery

## 2014-11-28 DIAGNOSIS — S82899A Other fracture of unspecified lower leg, initial encounter for closed fracture: Secondary | ICD-10-CM

## 2014-11-29 ENCOUNTER — Encounter: Payer: Self-pay | Admitting: Orthopedic Surgery

## 2014-12-04 ENCOUNTER — Telehealth: Payer: Self-pay | Admitting: Orthopedic Surgery

## 2014-12-04 NOTE — Telephone Encounter (Signed)
From the appts center:      [name],[name]          MRN:  AMRN: [id number]  [id number]     Sex:  DOB:  Age: F  [birth year]  29 Years     ICF:  SN:  Language:   YES  ENGLISH     FSC1:  FSC2: BCO  MCD   Hi Jenna [name],    Heres the email- the patient requested to speak with someone regarding pain management. She said she can be reached [phone].    Appts scheduled her for 10/20.  How do you want to handle this since she is chronic no show?  3 No Show Letters mailed to her for last 3 scheduled appts.  Last seen 07/05/14 by Aundra Millet.

## 2014-12-05 ENCOUNTER — Ambulatory Visit: Payer: Self-pay | Admitting: Obstetrics and Gynecology
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
## 2014-12-09 ENCOUNTER — Emergency Department
Admission: EM | Admit: 2014-12-09 | Disposition: A | Discharge status: Home / Self Care | Payer: Self-pay | Source: Ambulatory Visit | Attending: Psychiatry | Admitting: Psychiatry

## 2014-12-09 ENCOUNTER — Encounter: Payer: Self-pay | Admitting: Emergency Medicine

## 2014-12-09 ENCOUNTER — Other Ambulatory Visit: Payer: Self-pay | Admitting: Gastroenterology

## 2014-12-09 LAB — PREGNANCY TEST, SERUM: Preg,Serum: NEGATIVE

## 2014-12-09 LAB — CBC AND DIFFERENTIAL
Baso # K/uL: 0.1 10*3/uL (ref 0.0–0.1)
Basophil %: 0.7 %
Eos # K/uL: 0.3 10*3/uL (ref 0.0–0.4)
Eosinophil %: 2.3 %
Hematocrit: 29 % — ABNORMAL LOW (ref 34–45)
Hemoglobin: 8.2 g/dL — ABNORMAL LOW (ref 11.2–15.7)
IMM Granulocytes #: 0 10*3/uL (ref 0.0–0.1)
IMM Granulocytes: 0.4 %
Lymph # K/uL: 2.9 10*3/uL (ref 1.2–3.7)
Lymphocyte %: 27.2 %
MCH: 23 pg/cell — ABNORMAL LOW (ref 26–32)
MCHC: 28 g/dL — ABNORMAL LOW (ref 32–36)
MCV: 80 fL (ref 79–95)
Mono # K/uL: 1 10*3/uL — ABNORMAL HIGH (ref 0.2–0.9)
Monocyte %: 9 %
Neut # K/uL: 6.5 10*3/uL — ABNORMAL HIGH (ref 1.6–6.1)
Nucl RBC # K/uL: 0 10*3/uL (ref 0.0–0.0)
Nucl RBC %: 0.1 /100 WBC (ref 0.0–0.2)
Platelets: 429 10*3/uL — ABNORMAL HIGH (ref 160–370)
RBC: 3.6 MIL/uL — ABNORMAL LOW (ref 3.9–5.2)
RDW: 18.6 % — ABNORMAL HIGH (ref 11.7–14.4)
Seg Neut %: 60.4 %
WBC: 10.7 10*3/uL — ABNORMAL HIGH (ref 4.0–10.0)

## 2014-12-09 LAB — SALICYLATE LEVEL: Salicylate: <2 mg/dL — ABNORMAL LOW (ref 15.0–30.0)

## 2014-12-09 LAB — BASIC METABOLIC PANEL
Anion Gap: 12 (ref 7–16)
CO2: 27 mmol/L (ref 20–28)
Calcium: 9.1 mg/dL (ref 8.8–10.2)
Chloride: 105 mmol/L (ref 96–108)
Creatinine: 0.93 mg/dL (ref 0.51–0.95)
GFR,Black: 96 *
GFR,Caucasian: 83 *
Glucose: 101 mg/dL — ABNORMAL HIGH (ref 60–99)
Lab: 19 mg/dL (ref 6–20)
Potassium: 4.6 mmol/L (ref 3.3–5.1)
Sodium: 144 mmol/L (ref 133–145)

## 2014-12-09 LAB — CK: CK: 196 U/L — ABNORMAL HIGH (ref 34–145)

## 2014-12-09 LAB — ETHANOL: Ethanol: <10 mg/dL

## 2014-12-09 MED ORDER — BENZONATATE 100 MG PO CAPS *I*
200.0000 mg | ORAL_CAPSULE | Freq: Once | ORAL | Status: AC
Start: 2014-12-09 — End: 2014-12-09
  Administered 2014-12-09: 200 mg via ORAL
  Filled 2014-12-09: qty 2

## 2014-12-09 MED ORDER — IPRATROPIUM-ALBUTEROL 0.5-2.5 MG/3ML IN SOLN *I*
3.0000 mL | RESPIRATORY_TRACT | Status: AC
Start: 2014-12-09 — End: 2014-12-09
  Administered 2014-12-09 (×3): 3 mL via RESPIRATORY_TRACT
  Filled 2014-12-09 (×2): qty 3

## 2014-12-09 NOTE — ED Notes (Signed)
ED RN INTERN ATTESTATION       I Jobe Gibbon, RN (RN) reviewed the following charting information by the RN intern: Jenna [name]     Nursing Assessments  Medications  Plan of Care  Teaching   Notes    In the chart of Jenna [name] (29 y.o. female) and attest to the charting being accurate.

## 2014-12-09 NOTE — ED Notes (Signed)
Pt. To X-ray
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
## 2014-12-09 NOTE — ED Triage Notes (Addendum)
Seen yesterday for depression, still feeling depressed, denies SI/HI.  Recently lost her house and custody of children.  Consumed six beers PTA.  Stating she is having right knee pain from a cast she removed too early, no deformity, no erythema, no crepitus on exam.         Triage Note   Carolynn Comment, RN

## 2014-12-09 NOTE — CPEP Notes (Signed)
Clinical Evaluator Progress Note    Writer attempted to safety plan with pt. Pt stated "I don't need nothing from you, you bitch". Pt uncooperative with safety planning at this time.
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
## 2014-12-09 NOTE — ED Notes (Signed)
Report given to CPEP, Felipa Emory.

## 2014-12-09 NOTE — Discharge Instructions (Signed)
CPEP Discharge Instructions    Discharge Date: 12/09/2014    Discharge Time:   8:30 PM     Follow-ups:  Chemical Dependency Resources     How can I tell if I have an alcohol problem?    Alcohol may be harmful for you if it causes a problem in any part of your life. Following are signs that you may have a drinking problem or are alcohol dependent.     Blacking out or forgetting where you were or what you were doing   Drinking to get drunk. Or, feeling like you need to drink more to get the same feeling or "buzz"   Drinking to decrease pain or stress   Drinking more than you had expected to drink   Drinking in a pattern. For example, every day or every week at the same time   Suffering from the effects of alcohol on your daily function   Drinking starts to take over and causes problems in your daily life, such as not showing up for work or driving when you are drunk   Trying to hide how much you drink  Feeling guilty or angry when someone says something about your drinking   Seeing or hearing things that are not there (hallucinating)   Having major personality changes when you drink   Planning activities around drinking   Experiencing seizures (convulsions)   Shaking of your hands if you have not had a drink for a while   Sleeping problems or bad dreams   Sweating, nervousness, confusion, or depression   Thinking a lot about drinking   Trouble having erections     Whats the harm?    Not all drinking is harmful. You may have heard that regular light to moderate drinking  (from  drink a day up to 1 drink a day for women and 2 for men) can even be good  for the heart. With at-risk or heavy drinking, however, any potential benefits are  outweighed by greater risks.    Injuries. Drinking too much increases your chances of being injured or even killed.  Alcohol is a factor, for example, in about 60% of fatal burn injuries, drownings, and  homicides; 50% of severe trauma injuries and sexual assaults; and  40% of fatal  motor vehicle crashes, suicides, and fatal falls.    Health problems. Heavy drinkers have a greater risk of liver disease, heart disease,  sleep disorders, depression, stroke, bleeding from the stomach, sexually transmitted  infections from unsafe sex, and several types of cancer. They may also have problems  managing diabetes, high blood pressure, and other conditions.    Birth defects. Drinking during pregnancy can cause brain damage and other serious  problems in the baby. Because it is not yet known whether any amount of alcohol is  safe for a developing baby, women who are pregnant or may become pregnant should  not drink.    Alcohol use disorders. Generally known as alcoholism and alcohol abuse, alcohol  use disorders are medical conditions that doctors can diagnose when a patients  drinking causes distress or harm. In the [place], about 18 million people have  an alcohol use disorder.       Wellness Hints:     Be honest and open about your alcohol use with your family, close friends, and health care professionals. Ask for and accept their help.   Avoid persons who use and/or abuse alcohol and who try to get you to  drink alcohol.   Get the help of a counselor and keep regular appointments.   Eat a normal, well-balanced diet, drink six to eight glasses of water a day, and get plenty of rest.   Replace social activities associated with drinking alcohol with those that do not involve alcohol.   Consider cutting down on the amount of alcohol you drink and how often you drink it.    Resources for Drug and/or Alcohol Treatment   NextBroadcast.nl                  DETOX FACILITIES:    [place] Teacher, English as a foreign language)  [place] (6th floor) [place] [address] [phone]     [place] [place]   [address] [place] [address] ([place] [facility] (Singer or Straight Medicaid only)  [address] [place] [address]  [phone]     [facility], Alcohol Only)  [address] [place] [address] ([place] ([place] Only, Alcohol Only)  [address] [place] [address] ([place]  [address] [place] [address] [phone]       INPATIENT PROGRAMS:    [facility]   [place] [address] ([place]) [phone]   [place]  [address] [place] [address] [phone] option [place]  [place] [address] [place] [address]  (Facility is affiliated with [employer]. Referral must be sent to [facility] who will then forward information to this location.) ([place] ([place])  [address] [place] [address] [phone]     [place] [facility] Asp) - MICA Program-   [address] [place] [address]  Facility is affiliated with [facility]. Referral must be sent to [facility] who will then forward information to this location.) [phone]- [facility]   [address] [place] [address] (located on [place]) ([facility] ([place])  [address] [place] [address] [phone]      Solicitor ([place])   [address] [place] [address] [phone] ext [place]:    [facility] - Comprehensive Alcoholism  [address] [place] [address] [phone]   [facility] ([place])  [address] [place] [address] ([place] [facility] Depend[facility]  [place] [address] ([place])  [place] [address] [phone]  [phone]     [facility] (Monday- Thursday)  [address] [place] [address] [phone] ext [place] [address]  [address] [place]  [address] ([place] speaking available)  [phone] [place] [phone]  (indicate location when calling main intake line)   [place] [facility] (Hispanic Outreach) - [facility] Restart  [address] [place] [employer]  [address] ([facility])  [place] [address] [phone]     [place] (@ [place])  [address] [place] [address] [phone]     [facility]  [address] [place] [address] [phone]     [employer]  Dependency   [address] [place] [address]   [address] [place] [address]    [address] [place] [address] Jenelle Mages)  [phone]  (indicate location when calling main intake line)   [facility]  [address] [place] [address] [phone]         ADDITIONAL RESOURCES:    Al-Anon/Al-Teen [phone]   Alcohol Abuse & Addiction Information [phone]   Alcohol Anonymous [phone]   Drug Abuse Information Line [phone]   Drug and Alcohol Council [phone]   LifeLine [phone] or [address] Council on Alcohol & Substance Abuse Lenor Derrick) [phone]   Narcotics Anonymous Hotline [phone]   [facility] [phone] ext [phone]       VETERANS ADMINISTRATION:    [place]  [address] [place] [address] [phone]      [facility] No CD Treatment   [place]  [address] [place] [address] [place] [address] [phone]      [place]  [address] [place] [address] [phone]      [place]  [address] [place] [address] [phone]      [facility]  [address] [place] [address]  *only offers intake appointment if veteran is unable to be seen at another facility. Will refer out for treatment after intake is completed.  [phone]     [facility] No CD treatment    [facility]   [address] [place] [address] [phone]         ADULTS (29 y/o+)  Comprehensive  Outpatient Psychiatric Services (COPS)/Priority Access:  Walk In Options    Instructions: Please contact ONE of the providers listed below and inform them you were discharged from CPEP and needs a COPS/Open Access Appointment to begin the intake process.  Please plan your time accordingly.  Average intake time is 2 hours to complete.    Please bring your insurance information to the intake appointment.    [facility]  [address] [place], [place] [address]  Phone:  [phone]  WALK IN HOURS:  Tuesdays, Wednesdays, and Thursdays:  9am - 11am    [facility]  [address]  [place] [address]  Phone:  [phone]  WALK IN HOURS:  Tuesday and Thursdays:  8:30am - 11:30am      [facility]  [address]  [place], [place] [address]  Phone:  [phone]  WALK IN HOURS:  Monday and Thursday:  9am [facility]  [address]  [place], [place] [address]  Phone:  [phone]  WALK IN HOURS:  Wednesdays and Thursdays:  8:30am - 9:30am  Level of outreach indicated if patient fails new intake or COPS (Comprehensive Outpatient Psychiatric Service) appointment: Routine Program Follow-up    When to call for help:    Call your psychiatric outpatient provider if experiencing any of these symptoms: increased irritability, sleep changes, appetite changes, energy changes, thoughts to harm yourself or others, anxiety, fear, auditory or visual hallucinations.  Lifeline Helpline (24 hours/7 days) [phone] Consulting civil engineer)  Mobile Crisis team: [phone]  General Instructions:  Other written information given to the patient: No  Return to Work/School on: N/A           The above information has been discussed with me and I have received a copy.  I understand that I am advised to follow the instructions given to me to appropriately care for my condition.

## 2014-12-09 NOTE — ED Notes (Signed)
12/09/14 1129   Admission   CPEP Legal Status on Arrival Voluntary   *Mode of Arrival Ambulatory   First Contact 1130   Admitting Procedure   *Belonging Search Yes   *Belongings Searched By Amil Amen   *Patient Checked for Contraband Belongings Checked;Body Scanned with Wand;Clothing Checked   *Oriented to Unit Yes   *Information/Orientation Given Program orientation   15 Minute Checks Maintained Yes   Belongings   Dentures None   Vision - Corrective Lenses None   Hearing Aid/Cochlear Implant None   Jewelry None   Clothing With Patient;Clothing Room/Cupboard  (shoes, belt)   Home Medications with patient none   Other Valuables None   Prosthesis / Assistive Devices None   Valuables Given To None   Responsible person(s) in the waiting room? no   Cashier's Office No

## 2014-12-09 NOTE — CPEP Notes (Signed)
Clinical Evaluator Progress Note     Collateral Contact:     Drema Balzarine (aunt)   [phone]   Writer attempted to contact pt's aunt. Writer left a voicemail message requesting a return call.

## 2014-12-09 NOTE — Comprehensive Assessment (Signed)
12/09/14 2020   Disposition details   Patient is being discharged to community   Family notification not done-no family/no family involvement   Outpatient provider notification of disposition patient has no outpatient provider   Living situation: notification of disposition not done-unable to reach   Transportation arranged other (comment)  (pt refused bus passes)   Patient belongings  given to patient   Medications NA- patient has none   Valuables NA- patient has none

## 2014-12-09 NOTE — ED Notes (Signed)
Pt states she presented to the ED due to Right leg pain from a previous surgery. Pt states that "the screws came lose." Pt rates that pain 8/10. Pt also stated that she is very depressed and having suicidal ideation because of a recent miscarriage she had. When asked if she has used any substances lately she stated, "yes, I've used everything. Crack cocaine, weed, and alcohol over the past few days." Pt stated she used alcohol this evening and only "had 1 drink."  Pt also complained of a cough and was provided with a mask. Pt is A&0x3 and lying in bed. Pts belongings have been labeled and placed in storage.

## 2014-12-10 LAB — EKG 12-LEAD
P: 9 degrees
QRS: 21 degrees
Rate: 71 {beats}/min
Severity: NORMAL
Severity: NORMAL
T: 61 degrees

## 2014-12-10 NOTE — CPEP Notes (Signed)
[facility] wanting records. Writer routed records over to [facility] [phone])

## 2015-01-02 ENCOUNTER — Ambulatory Visit: Payer: Self-pay | Admitting: Orthopedic Surgery

## 2015-01-02 NOTE — Telephone Encounter (Signed)
Pt scheduled for 10/20.  No Showed for appt.

## 2015-01-06 ENCOUNTER — Encounter: Payer: Self-pay | Admitting: Orthopedic Surgery

## 2015-01-07 ENCOUNTER — Other Ambulatory Visit: Payer: Self-pay | Admitting: Internal Medicine

## 2015-01-07 NOTE — Telephone Encounter (Signed)
Seroquel is prescribed by her psychiatrist and not by the [facility]. She should contact them for it    Marcial PacasMichael Vincent Keandria Berrocal, MD  Internal Medicine PGY2 [phone]  4:58 PM

## 2015-01-07 NOTE — Telephone Encounter (Signed)
MD unavailable, Rx request routed to covering [facility] team resident for 01/07/2015

## 2015-01-07 NOTE — Telephone Encounter (Signed)
Ms. Jenna [name] is calling to check the status of her medication refill request for Seroquel. She would like a return call at [phone].    She is requesting that her script be sent to [facility] - [place], [place] [address]

## 2015-01-07 NOTE — Telephone Encounter (Signed)
MD unavailable, Rx request routed to [facility] for 01/07/2015

## 2015-01-07 NOTE — Telephone Encounter (Signed)
Jenna [name] calling to request prescription(s)   Requested Prescriptions     Pending Prescriptions Disp Refills    QUEtiapine (SEROQUEL) 50 MG tablet 30 tablet 0     Sig: Take 1 tablet (50 mg total) by mouth nightly    to be sent to  [facility]    Is patient out of the medication? yes  Does the patient have questions regarding the medication for the nurse? no    Patient can be reached if necessary at [phone].    Patient stated that pharmacy needed a new script written for this med.

## 2015-01-17 ENCOUNTER — Telehealth: Payer: Self-pay | Admitting: Orthopedic Surgery

## 2015-01-17 NOTE — Telephone Encounter (Signed)
Given, Tonny Branchachel M, [place]PA  Cristino MartesWilson, Chelby Salata A      Caller: Unspecified (Today, 1:48 PM)                Returned call. Patient stating she needs an appointment to be seen about further surgery on her leg. She states she no-showed her other appointments as she was not in a stable living situation. Also requesting narcotics, but explained why we couldn't prescribe these.

## 2015-01-17 NOTE — Telephone Encounter (Signed)
Pt called stating that she is in "excrutiating pain" and feels like something is loose.  Stated that she has been in and out of the ED and they will not give her any pain medication and that OTC does not help her.  She is asking to speak with a provider.      (She has not been to an appt since April and has been completely non-compliant and we've begun the process of discharging her from the practice.)      She stated that she can be reached at:  [phone]

## 2015-01-17 NOTE — Telephone Encounter (Signed)
Do you want me to schedule her?

## 2015-01-17 NOTE — Telephone Encounter (Signed)
Okay to schedule per [facility].      Scheduled for 01/23/15 @ 9am.  LVM for patient w/ appt info.

## 2015-01-23 ENCOUNTER — Ambulatory Visit: Payer: Self-pay | Admitting: Orthopedic Surgery

## 2015-01-27 ENCOUNTER — Encounter: Payer: Self-pay | Admitting: Orthopedic Surgery

## 2015-02-13 ENCOUNTER — Encounter: Payer: Self-pay | Admitting: Gastroenterology

## 2015-02-25 ENCOUNTER — Ambulatory Visit: Payer: Self-pay | Admitting: Obstetrics and Gynecology

## 2015-03-05 ENCOUNTER — Ambulatory Visit: Payer: Self-pay | Admitting: Obstetrics and Gynecology

## 2015-03-20 ENCOUNTER — Encounter: Payer: Self-pay | Admitting: Orthopedic Surgery

## 2015-03-20 NOTE — Progress Notes (Signed)
Pt being discharged from Dr Santina Evansatherine Humphrey's practice due to chronic no shows to appointments and non compliance.  Discharge letter dated 02/13/15 mailed to address that was on file at the time ([address], [place] [id number]).      Rec'd letter back in Dr [facility] office mail as "Not Deliverable as Addressed, Unable to Forward"    Appears that pt's address has since changed and been updated in the system to:  [address], [place], [place] [id number].      Same letter being mailed to above address on 03/20/15.

## 2015-04-11 ENCOUNTER — Telehealth: Payer: Self-pay | Admitting: Internal Medicine

## 2015-04-11 NOTE — Telephone Encounter (Signed)
Patient has a UTI and other female issues. Writer scheduled patient with her doctor on 1/30 @8am .    Writer also provided patient with her OBGYN number    Patient would like a call back at [phone]

## 2015-04-11 NOTE — Telephone Encounter (Signed)
Left message for pt to call back  °

## 2015-04-14 ENCOUNTER — Encounter: Payer: Self-pay | Admitting: Internal Medicine

## 2015-04-14 ENCOUNTER — Ambulatory Visit: Payer: Self-pay | Admitting: Internal Medicine

## 2015-04-14 VITALS — BP 138/82 | HR 84 | Temp 96.4°F | Ht 65.0 in | Wt 274.0 lb

## 2015-04-14 DIAGNOSIS — N390 Urinary tract infection, site not specified: Secondary | ICD-10-CM

## 2015-04-14 LAB — URINALYSIS REFLEX TO CULTURE
Blood,UA: NEGATIVE
Ketones, UA: NEGATIVE
Leuk Esterase,UA: NEGATIVE
Nitrite,UA: NEGATIVE
Protein,UA: NEGATIVE mg/dL
Specific Gravity,UA: 1.027 (ref 1.002–1.030)
pH,UA: 6 (ref 5.0–8.0)

## 2015-04-14 MED ORDER — BUDESONIDE-FORMOTEROL FUMARATE 80-4.5 MCG/ACT IN AERO *I*
2.0000 | INHALATION_SPRAY | Freq: Two times a day (BID) | RESPIRATORY_TRACT | 3 refills | Status: DC
Start: 2015-04-14 — End: 2015-12-11

## 2015-04-14 MED ORDER — SULFAMETHOXAZOLE-TRIMETHOPRIM 400-80 MG PO TABS *I*
1.0000 | ORAL_TABLET | Freq: Two times a day (BID) | ORAL | 0 refills | Status: AC
Start: 2015-04-14 — End: 2015-04-17

## 2015-04-14 MED ORDER — ALBUTEROL SULFATE HFA 108 (90 BASE) MCG/ACT IN AERS *I*
1.0000 | INHALATION_SPRAY | Freq: Four times a day (QID) | RESPIRATORY_TRACT | 3 refills | Status: DC | PRN
Start: 2015-04-14 — End: 2015-12-11

## 2015-04-14 MED ORDER — METRONIDAZOLE 500 MG PO TABS *I*
500.0000 mg | ORAL_TABLET | Freq: Two times a day (BID) | ORAL | 0 refills | Status: AC
Start: 2015-04-14 — End: 2015-10-11

## 2015-04-14 MED ORDER — ACETAMINOPHEN 500 MG PO TABS *I*
500.0000 mg | ORAL_TABLET | Freq: Four times a day (QID) | ORAL | 5 refills | Status: DC | PRN
Start: 2015-04-14 — End: 2015-11-15

## 2015-04-14 NOTE — Progress Notes (Addendum)
[facility] Progress Note    Reason For Visit:   Chief Complaint   Patient presents with    Urinary Tract Infection       HPI/ROS:      Jenna [name] is a 30 y.o. female who presents to clinic today with concerns for UTI and vaginal discharge.     Patient reports 3 weeks hx of dysuria, urgency and frequency. Also complains about mild white vaginal discharge and "fishy odor". Reports [name]. No fevers or chills, N/V. Appetite is good.   Sexually active, one partner for the past year. Currently using cocaine and marijuana.    She was seen by urgent clinic 3 weeks ago and reportedly diagnosed with UTI and BV. Was prescribed abx but never picked it up from pharmacy. In addition she was checked for STD including HIV, chlamydia and gonorrhea and was told tests were negative.     In addition she complains DOE for months, ran out of her inhalers for asthma, previously on albuterol and steroid inhaler (does not know the name). No increased cough, fevers, chest pain, sputum production.       Medications:     Current Outpatient Prescriptions on File Prior to Visit   Medication Sig Dispense Refill    FLUoxetine (PROZAC) 10 MG capsule Take 1 capsule (10 mg total) by mouth daily 30 capsule 0    QUEtiapine (SEROQUEL) 50 MG tablet Take 1 tablet (50 mg total) by mouth nightly 30 tablet 0     No current facility-administered medications on file prior to visit.        Allergies:   No Known Allergies (drug, envir, food or latex)    Physical Exam:     Vitals:    04/14/15 0830   BP: 138/82   Pulse: 84   Temp: 35.8 C (96.4 F)   TempSrc: Temporal   Weight: 124.3 kg (274 lb)   Height: 1.651 m ( )     General: In no apparent distress.  HEENT: Eyes- PERRL. Throat- No exudates.  Neck:  No LAD.  Pulmonary: clear  Cardiovascular: No JVD, No carotid bruits. RRR. No murmurs, rubs, or gallops. No pedal edema, no CVA tednerness  Abdominal: Soft. Non-distended. Non-tender. Bowel sounds present.  Skin: No wounds  on feet.    Assessment and Plan:    Jenna [name] is a 30 y.o. Female with hx  Of  etoh abuse, polysubstance abuse (cocaine, marijuana), asthma,medication non compliance  who presents to clinic today with 3 weeks hx of urinary urgency/freqency/dysuria in addition to vaginal discharge/fishy odor under setting of multiple previous BV. She was seen in urgent clinic 3 weeks ago and reportedly diagnosed with UTI and BV but never finished antibiotic course.     Plan:  UTI  - bactrim for 3 days  - check UA    Recurrent Bacterial Vaginosis  - metronidazole  BID for 7 days    Asthma: no compliant with inhalers  - refills for inhalers provided    Follow up in 6 weeks for HCM    Tobey Grim, MD 04/14/2015 8:36 AM    Patient's care discussed with resident at time of visit.  Agree with plans regarding UTI, BV, asthma.    Electronically signed by Lorenda Ishihara. Idonna Heeren, MD 04/14/2015 at 10:19 AM

## 2015-04-14 NOTE — Patient Instructions (Signed)
Urine sample    For BV: Take metronidazole 1 tab twice a day for 7 days    For Urinary tract infection: Take bactrim 1 tablet twice a day for 3 days    See you OBGYN    For asthma: Take albuterol as needed for trouble breathing, Take Symbicort inhaler twice a day.    Follow up in 6 weeks.

## 2015-05-20 ENCOUNTER — Other Ambulatory Visit: Payer: Self-pay | Admitting: Internal Medicine

## 2015-05-20 NOTE — Progress Notes (Signed)
Unable to reach.    Immunizations:  Medicare?  no          Pneumovax/Smoker                                                                              If > 30 yo   Overdue: Yes        Agrees to schedule:  unknown    *If no Pneumovax, then Prevnar 13 (PCV23)     Order code: [id number]   If Prevnar 13 then Pneumovax > or = 12 month later (PVC12)    Order code: [id number]    Information:  For Tdap and Zostavax with Medicare:  Medicare and/or dual eligible patients should be directed to their pharmacy to receive these vaccines (a copay may apply - though usually minimal).   • Local pharmacies that have all vaccinations available include:   o [facility], [facility], [facility], [facility], [facility] (dependent upon location)

## 2015-06-02 ENCOUNTER — Ambulatory Visit: Payer: Self-pay | Admitting: Internal Medicine

## 2015-06-06 ENCOUNTER — Ambulatory Visit: Payer: Self-pay | Admitting: Obstetrics and Gynecology

## 2015-07-07 ENCOUNTER — Emergency Department (HOSPITAL_COMMUNITY)
Admission: EM | Admit: 2015-07-07 | Discharge: 2015-07-07 | Disposition: A | Discharge status: Home / Self Care | Payer: Medicaid Other | Attending: Emergency Medicine | Admitting: Emergency Medicine

## 2015-07-07 ENCOUNTER — Encounter (HOSPITAL_COMMUNITY): Payer: Self-pay | Admitting: Emergency Medicine

## 2015-07-07 DIAGNOSIS — Z793 Long term (current) use of hormonal contraceptives: Secondary | ICD-10-CM | POA: Diagnosis not present

## 2015-07-07 DIAGNOSIS — F329 Major depressive disorder, single episode, unspecified: Secondary | ICD-10-CM | POA: Insufficient documentation

## 2015-07-07 DIAGNOSIS — Z791 Long term (current) use of non-steroidal anti-inflammatories (NSAID): Secondary | ICD-10-CM | POA: Diagnosis not present

## 2015-07-07 DIAGNOSIS — R519 Headache, unspecified: Secondary | ICD-10-CM

## 2015-07-07 DIAGNOSIS — R51 Headache: Secondary | ICD-10-CM | POA: Insufficient documentation

## 2015-07-07 DIAGNOSIS — G43909 Migraine, unspecified, not intractable, without status migrainosus: Secondary | ICD-10-CM | POA: Diagnosis present

## 2015-07-07 HISTORY — DX: Migraine, unspecified, not intractable, without status migrainosus: G43.909

## 2015-07-07 HISTORY — DX: Anxiety disorder, unspecified: F41.9

## 2015-07-07 HISTORY — DX: Depression, unspecified: F32.A

## 2015-07-07 HISTORY — DX: Major depressive disorder, single episode, unspecified: F32.9

## 2015-07-07 MED ORDER — DEXAMETHASONE SODIUM PHOSPHATE 10 MG/ML IJ SOLN
10.0000 mg | Freq: Once | INTRAMUSCULAR | Status: AC
  Administered 2015-07-07: 10 mg via INTRAVENOUS
  Filled 2015-07-07: qty 1

## 2015-07-07 MED ORDER — DIPHENHYDRAMINE HCL 50 MG/ML IJ SOLN
12.5000 mg | Freq: Once | INTRAMUSCULAR | Status: AC
  Administered 2015-07-07: 12.5 mg via INTRAVENOUS
  Filled 2015-07-07: qty 1

## 2015-07-07 MED ORDER — SODIUM CHLORIDE 0.9 % IV BOLUS (SEPSIS)
500.0000 mL | Freq: Once | INTRAVENOUS | Status: AC
  Administered 2015-07-07: 500 mL via INTRAVENOUS

## 2015-07-07 MED ORDER — KETOROLAC TROMETHAMINE 30 MG/ML IJ SOLN
30.0000 mg | Freq: Once | INTRAMUSCULAR | Status: AC
  Administered 2015-07-07: 30 mg via INTRAVENOUS
  Filled 2015-07-07: qty 1

## 2015-07-07 MED ORDER — NAPROXEN 375 MG PO TABS: 375.0000 mg | ORAL_TABLET | Freq: Two times a day (BID) | ORAL | Status: DC

## 2015-07-07 MED ORDER — METOCLOPRAMIDE HCL 5 MG/ML IJ SOLN
10.0000 mg | Freq: Once | INTRAMUSCULAR | Status: AC
  Administered 2015-07-07: 10 mg via INTRAVENOUS
  Filled 2015-07-07: qty 2

## 2015-07-07 NOTE — Discharge Instructions (Signed)
General Headache Without Cause A headache is pain or discomfort felt around the head or neck area. There are many causes and types of headaches. In some cases, the cause may not be found.  HOME CARE  Managing Pain  Take over-the-counter and prescription medicines only as told by your doctor.  Lie down in a dark, quiet room when you have a headache.  If directed, apply ice to the head and neck area:  Put ice in a plastic bag.  Place a towel between your skin and the bag.  Leave the ice on for 20 minutes, 2-3 times per day.  Use a heating pad or hot shower to apply heat to the head and neck area as told by your doctor.  Keep lights dim if bright lights bother you or make your headaches worse. Eating and Drinking  Eat meals on a regular schedule.  Lessen how much alcohol you drink.  Lessen how much caffeine you drink, or stop drinking caffeine. General Instructions  Keep all follow-up visits as told by your doctor. This is important.  Keep a journal to find out if certain things bring on headaches. For example, write down:  What you eat and drink.  How much sleep you get.  Any change to your diet or medicines.  Relax by getting a massage or doing other relaxing activities.  Lessen stress.  Sit up straight. Do not tighten (tense) your muscles.  Do not use tobacco products. This includes cigarettes, chewing tobacco, or e-cigarettes. If you need help quitting, ask your doctor.  Exercise regularly as told by your doctor.  Get enough sleep. This often means 7-9 hours of sleep. GET HELP IF:  Your symptoms are not helped by medicine.  You have a headache that feels different than the other headaches.  You feel sick to your stomach (nauseous) or you throw up (vomit).  You have a fever. GET HELP RIGHT AWAY IF:   Your headache becomes really bad.  You keep throwing up.  You have a stiff neck.  You have trouble seeing.  You have trouble speaking.  You have  pain in the eye or ear.  Your muscles are weak or you lose muscle control.  You lose your balance or have trouble walking.  You feel like you will pass out (faint) or you pass out.  You have confusion.   This information is not intended to replace advice given to you by your health care provider. Make sure you discuss any questions you have with your health care provider.  Take Naprosyn or ibuprofen as needed for headache. Follow up with your primary care provider if your symptoms do not improve. Return to the ED if you experience fever, neck pain/stiffness, blurry vision, weakness.

## 2015-07-07 NOTE — ED Provider Notes (Signed)
CSN: [id number]     Arrival date & time 07/07/15  4098 History   First MD Initiated Contact with Patient 07/07/15 1011     Chief Complaint  Patient presents with  . Migraine     (Consider location/radiation/quality/duration/timing/severity/associated sxs/prior Treatment) HPI   Samantha Nguyen is a 30 year old female with a past medical history of anxiety, depression, migraines who presents the ED today complaining of a headache. Patient states that she developed a severe migraine behind her eyes in the middle of the night last night. Patient developed nausea and had one episode of subsequent emesis, nonbloody, nonbilious. Patient states she had migraines similar to this is a child but she has not had one in several years. She has not taken anything for symptoms that she did not have any medications at home. She denies neck pain/stiffness, fever, rash, paresthesias, weakness, blurry vision, photophobia, phonophobia. Patient states that while in the emergency room she had her blood pressure taken by the nurse and it read 143/95. Patient states she felt this was a very high number which made her scared so she began to cry.    Past Medical History  Diagnosis Date  . Migraine   . Anxiety   . Depressed   . Kidney stone    Past Surgical History  Procedure Laterality Date  . Cesarean section     History reviewed. No pertinent family history. Social History  Substance Use Topics  . Smoking status: None  . Smokeless tobacco: None  . Alcohol Use: None   OB History    No data available     Review of Systems  All other systems reviewed and are negative.     Allergies  Sulfa antibiotics  Home Medications   Prior to Admission medications   Medication Sig Start Date End Date Taking? Authorizing Provider  ibuprofen (ADVIL,MOTRIN) 200 MG tablet Take 400 mg by mouth every 6 (six) hours as needed for fever, headache, mild pain, moderate pain or cramping.   Yes Historical Provider, MD   norethindrone-ethinyl estradiol (MICROGESTIN FE 1/20) 1-20 MG-MCG tablet Take 1 tablet by mouth daily.   Yes Historical Provider, MD  PARoxetine (PAXIL) 30 MG tablet Take 30 mg by mouth daily.   Yes Historical Provider, MD   BP 143/95 mmHg  Pulse 97  Temp(Src) 98.2 F (36.8 C) (Oral)  Resp 20  SpO2 100% Physical Exam  Constitutional: She is oriented to person, place, and time. She appears well-developed and well-nourished. No distress.  Patient tearful on exam.  HENT:  Head: Normocephalic and atraumatic.  Eyes: Conjunctivae and EOM are normal. Pupils are equal, round, and reactive to light. Right eye exhibits no discharge. Left eye exhibits no discharge. No scleral icterus.  Neck:  No midline cervical spinal tenderness. No meningismus.  Cardiovascular: Normal rate.   Pulmonary/Chest: Effort normal and breath sounds normal.  Neurological: She is alert and oriented to person, place, and time. No cranial nerve deficit. Coordination normal.  Strength 5/5 throughout. No sensory deficits. No dysmetria.   Skin: Skin is warm and dry. No rash noted. She is not diaphoretic. No erythema. No pallor.  Psychiatric: She has a normal mood and affect. Her behavior is normal.  Nursing note and vitals reviewed.   ED Course  Procedures (including critical care time) Labs Review Labs Reviewed - No data to display  Imaging Review No results found. I have personally reviewed and evaluated these images and lab results as part of my medical decision-making.   EKG Interpretation  None      MDM   Final diagnoses:  Acute intractable headache, unspecified headache type    The patient arrived with signs and symptoms consistent with a migraine headache. The patient has history of migraines and she states that this feels like her previous migraines. The patient has a reassuring neuro exam lessening chances of intracranial abnormalities. No meningismus. The patient was given decadron, Reglan,  Benadryl, Toradol with patient's pain significantly improved. Patient states she sitting very anxious and became tearful again stating that she just wants to go home. She states that she has here by herself and does not want to be here anymore. Her migraine has significantly improved when she would like to go home so she can rest The patient is hemodynamically stable with reassuring neuro exam. The patient was then discharged from the Emergency Department with no further acute issues. Recommend follow up with neurologist or PCP within the next week.      Lester KinsmanSamantha Tripp [place], PA-C 07/07/15 1100  Pricilla LovelessScott Goldston, MD 07/10/15 [phone]

## 2015-07-07 NOTE — ED Notes (Signed)
Pt states she's having a migraine on-going since Saturday night behind her eyes. Denies photophobia/neck pain/fever. Says moving quickly makes her nauseous, hx of migraines.

## 2015-07-09 ENCOUNTER — Telehealth: Payer: Self-pay | Admitting: Student in an Organized Health Care Education/Training Program

## 2015-07-09 ENCOUNTER — Ambulatory Visit: Payer: Self-pay | Admitting: Internal Medicine

## 2015-07-09 NOTE — Telephone Encounter (Signed)
Attempted to contact pt at the number listed below and on her chart, was told pt "doesn't live here".  Will send a MyChart message.

## 2015-07-09 NOTE — Telephone Encounter (Signed)
Phone Call -- After Hours Triage     Date of call: 07/09/2015  Time of call: 7:41 AM  Phone number: [phone]    Jenna [name] 05/01/1985 called reporting:   "possible cysts in groin and armpits"   She says that she is having a flare-up of her hydradenitis under the creases of her armpit and in the groin   She usually goes to ED for drainage but doesn't think it needs drainage   She is requesting antibiotics for the cysts   No fevers      Impression     Case of hydradenitis suppurativa in 29yo woman. She needs evaluation. I don't get this impression that this has progressed to systemic infection but needs clinic evaluation; she might need referral to surgery.    Disposition / Plan      Patient instructed to:  1.  Will need to schedule for illness visit at clinic. I told her she needs evaluation to determine treatment (antibiotics vs drainage vs surgery).  2. Instructed to call back if symptoms worsen, change or progress.      Author: Ottie GlazierBrian L Chardai Gangemi, MD as of 07/09/2015  at 7:41 AM

## 2015-07-23 ENCOUNTER — Ambulatory Visit: Payer: Self-pay

## 2015-07-31 ENCOUNTER — Ambulatory Visit: Payer: Self-pay | Admitting: Internal Medicine

## 2015-08-06 ENCOUNTER — Telehealth: Payer: Self-pay | Admitting: Internal Medicine

## 2015-08-06 NOTE — Telephone Encounter (Signed)
Message noted- Will advise MD

## 2015-08-06 NOTE — Telephone Encounter (Signed)
Patient states she is having a Hidradenitis flare up.    Writer scheduled patient to be seen in urgent care 5/26 at 9am.    Patient can be reached if necessary at [phone]

## 2015-08-06 NOTE — Telephone Encounter (Addendum)
Patient is scheduled to be seen in urgent care on Friday 5/26 for her Hydradenitis.    Patient has a form she needs completed for DSS as well that she would like to bring to the appointment. Writer advised patient would have to schedule another appointment especially if she needs a physical.    Patient declined. Patient stated form is due on 5/26 and she is going to bring it to appointment with her.     Patient can be reached if necessary at [phone]

## 2015-08-08 ENCOUNTER — Ambulatory Visit: Payer: Self-pay

## 2015-08-20 ENCOUNTER — Telehealth: Payer: Self-pay | Admitting: Internal Medicine

## 2015-08-20 ENCOUNTER — Ambulatory Visit: Payer: Self-pay

## 2015-08-20 NOTE — Telephone Encounter (Signed)
returned call to Ms. Jenna [name]  She has axillary hydradenitis  She denies fever, chills, nausea , vomiting  She cancelled appt for today due to transportation   There are no appts available tomorrow   Pt advised to use warm soaks and take tylenol as directed on the package   Pt advised to go to ED if fever, chills, nausea, vomiting  Pt agrees to do so and will call the office whenever necessary  She will keep appt for Friday , 6/9

## 2015-08-20 NOTE — Telephone Encounter (Signed)
Patient is calling to reschedule symptomatic appointment to tomorrow.    No appointments available.    Writer scheduled next available on 6.9.2017 with Urgent Care.    Patient is requesting to be seen tomorrow.    Patient states she is in pain.    Patient can be reached at [phone]

## 2015-08-22 ENCOUNTER — Ambulatory Visit: Payer: Self-pay

## 2015-08-25 ENCOUNTER — Ambulatory Visit: Payer: Self-pay | Admitting: Internal Medicine

## 2015-09-08 ENCOUNTER — Ambulatory Visit: Payer: Self-pay | Admitting: Nurse Practitioner

## 2015-09-10 ENCOUNTER — Ambulatory Visit: Payer: Self-pay | Admitting: Internal Medicine

## 2015-10-03 ENCOUNTER — Encounter: Payer: Self-pay | Admitting: Hospitalist

## 2015-10-03 ENCOUNTER — Ambulatory Visit: Payer: Self-pay | Admitting: Hospitalist

## 2015-10-03 ENCOUNTER — Telehealth: Payer: Self-pay

## 2015-10-03 VITALS — BP 142/90 | HR 76 | Temp 97.0°F | Wt 284.0 lb

## 2015-10-03 DIAGNOSIS — N760 Acute vaginitis: Secondary | ICD-10-CM

## 2015-10-03 DIAGNOSIS — L732 Hidradenitis suppurativa: Secondary | ICD-10-CM

## 2015-10-03 DIAGNOSIS — Z0000 Encounter for general adult medical examination without abnormal findings: Secondary | ICD-10-CM

## 2015-10-03 DIAGNOSIS — B9689 Other specified bacterial agents as the cause of diseases classified elsewhere: Secondary | ICD-10-CM

## 2015-10-03 MED ORDER — MINOCYCLINE HCL 100 MG PO CAPS *I*
100.0000 mg | ORAL_CAPSULE | Freq: Two times a day (BID) | ORAL | 0 refills | Status: DC
Start: 2015-10-03 — End: 2015-11-24

## 2015-10-03 MED ORDER — METRONIDAZOLE 500 MG PO TABS *I*
500.0000 mg | ORAL_TABLET | Freq: Two times a day (BID) | ORAL | 0 refills | Status: AC
Start: 2015-10-03 — End: 2015-10-10

## 2015-10-03 NOTE — Patient Instructions (Signed)
1. Take metronidazole as prescribed  2. Take minocycline as prescribed  3. Have blood work done  4. Schedule appointment with General Surgery  5. Follow up with OBGYN.

## 2015-10-03 NOTE — Telephone Encounter (Signed)
Patient called stating she is having BV sypmtoms. Patient did not want a nurse to call her back. Wanted to be transferred. Got upset and hung up.

## 2015-10-03 NOTE — Progress Notes (Addendum)
[facility] Progress Note    Subjective:       Jenna [name] is a 30 y.o. year old female with PMH hidradenitis and recurring bacterial vaginosis presenting today for flare up of hidradenitis and recurrence of bacterial vaginosis.      Hydradinitis:  She reports flare up in her b/l groin and under arms.  Has been an issue for her since she was 16.  The lesions are painful, red and drain pus.  Has tried hot compress, not helping.  Has had to have it lanced in the past.  Has tried both topical and oral antibiotics with no relief.  Was told that she needed surgical intervention in the past but has not followed through.    BV:  Has history of BV infections.  Feels like prior infections today.  Reports fishy odor and is reporting white discharge.  Has taken antibiotics in the past with relief.  No dysuria, does report urgency and frequency.  No dyspareunia. Not using protection with intercourse.  Only sexually active with her fiance. No fever or chills.       Medications reviewed and reconciled.   Current Outpatient Prescriptions   Medication Sig    albuterol HFA 108 (90 BASE) MCG/ACT inhaler Inhale 1-2 puffs into the lungs every 6 hours as needed for Wheezing   Shake well before each use.    budesonide-formoterol (SYMBICORT) 80-4.5 MCG/ACT inhaler Inhale 2 puffs into the lungs 2 times daily   Shake well before each use.    metroNIDAZOLE (FLAGYL) 500 MG tablet Take 1 tablet (500 mg total) by mouth 2 times daily Avoid alcohol.    acetaminophen (TYLENOL) 500 mg tablet Take 1 tablet (500 mg total) by mouth every 6 hours as needed for Pain    FLUoxetine (PROZAC) 10 MG capsule Take 1 capsule (10 mg total) by mouth daily    QUEtiapine (SEROQUEL) 50 MG tablet Take 1 tablet (50 mg total) by mouth nightly         Physical Exam:     Vitals:    10/03/15 1557   BP: 142/90   Pulse: 76   Temp: 36.1 C (97 F)   TempSrc: Temporal   Weight: 128.8 kg (284 lb)     Wt Readings from Last 3 Encounters:   10/03/15 128.8 kg  (284 lb)   04/14/15 124.3 kg (274 lb)   11/16/14 127 kg (280 lb)     BP Readings from Last 3 Encounters:   10/03/15 142/90   04/14/15 138/82   11/16/14 138/70       General: In no apparent distress.  HEENT: PERRL.   Pulmonary: CTA B/L. No wheezes, rhonchi, or rales.  Cardiovascular: No JVD, No carotid bruits. RRR. No murmurs, rubs, or gallops.   Abdominal: Soft. Non-distended. Non-tender. Bowel sounds present.  Extremities: No peripheral edema. Pedal pulses 2+ bilaterally. No calf tenderness  Neuro: CN II - XII grossly intact. Strength equal bilaterally.   Skin: Multiple deep-seated inflamed nodules present under both axillae and her groin.  A few appear to be draining.  They are tender to touch.   Pelvic Exam: patient refused.       Assessment and Plan:       Jenna [name] is a 30 y.o. year old female with PMH hidradenitis and recurring bacterial vaginosis presenting today for flare up of hidradenitis and recurrence of bacterial vaginosis.      1. Hidradenitis:  - Given the extent of her disease she would benefit  from surgical evaluation.  Although she has not had response to antibiotics in the past, she would like to try a course today.  - prescribed minocycline 100 mg BID for 60 days.     2. Bacterial vaginosis  - Prescribed metronidazole 500 mg BID for 7 days  - Will need to see her OBGYN.      3. HM  - Will check HbA1c to screen for diabetes because she is at high risk.    Follow up in 6 weeks for follow up and to meet CCP.     Jenna LandauKhaled Abdulla, MD  Internal Medicine PGY-1  10/03/2015 4:36 PM  Attending Addendum:  Patient discussed on the date of the visit.  I agree with the plans as outlined.  Will resume a course of minocycline and will refer to surgery.  Jenna RoesJOHN C Thetis Schwimmer, MD

## 2015-10-30 ENCOUNTER — Encounter: Payer: Self-pay | Admitting: Obstetrics and Gynecology

## 2015-10-30 ENCOUNTER — Ambulatory Visit: Payer: Self-pay | Admitting: Obstetrics and Gynecology

## 2015-10-30 VITALS — BP 153/70 | HR 80 | Ht 65.0 in | Wt 288.1 lb

## 2015-10-30 DIAGNOSIS — N926 Irregular menstruation, unspecified: Secondary | ICD-10-CM

## 2015-10-30 DIAGNOSIS — O10919 Unspecified pre-existing hypertension complicating pregnancy, unspecified trimester: Secondary | ICD-10-CM

## 2015-10-30 LAB — POCT URINE PREGNANCY
Lot #: 170601
Preg Test,UR POC: POSITIVE — AB

## 2015-10-30 MED ORDER — PRENATAL VITAMINS PLUS 27-1 MG PO TABS
1.0000 | ORAL_TABLET | Freq: Every day | ORAL | 11 refills | Status: DC
Start: 2015-10-30 — End: 2015-11-24

## 2015-10-30 NOTE — Patient Instructions (Signed)
Common Discomforts:  FIRST TRIMESTER    Pregnancy is an exciting time in your life.  Even though you may have planned to have this baby, your body's changes may not be something you planned on.  Here are some of the more common discomforts that women have during pregnancy.  Talk to your health care provider (HCP) if these tips do not seem to help or if you have other concerns.    Nausea and Morning Sickness  Nausea is the most common complaint of early pregnancy.  It is usually caused by hormonal changes.  Things that help with nausea include the following:  " Do not let your stomach get completely empty.  Eat small, frequent meals.   " Keep crackers or dry toast at your bedside and eat before getting up.  " Eat a small snack before going to bed at night.  " Do not drink large amounts of liquids at one time.  " Avoid foods that are greasy, spicy or have odors that bother you.  " Take your prenatal vitamin at night if taking it in the morning upsets your stomach.    Fatigue  Feeling tired is common in early pregnancy due to the increase needs of your body.  Your amount of sleep needs to increase by several hours.   It is important to take naps and go to bed earlier.  Even just putting your feet up for a few minutes every hour or so helps.    Heartburn  Heartburn is a burning feeling in your chest.  During early pregnancy, stomach acid can back up into your esophagus due to a change in your hormone levels.  Things you can do to help relieve heartburn are:  " Avoid eating large meals and greasy, spicy, fatty foods.  " Avoid chewing gum.  " Do not lie down, bend over or stoop for 2 hours after eating.   " Raise the head of your bed 6 inches. You can use a couple of blocks of wood to do this.   " Avoid tight clothing around your abdomen or waist.  " Check with your HCP about an antacid that is safe to use.     Constipation  Constipation (going too long without having a bowel movement) happens because your hormone levels  slow down the normal activity of your gastrointestinal (GI) tract, especially your intestines.  Things that help include:  " Increasing the "roughage" or fiber that you eat.  Eat more fruit, vegetables, dried fruits and bran or whole grain foods.  " Drinking more fluids-at least eight tall glasses of liquids each day.  Water is the best, but any liquids will help.  " Increasing your activity. Even taking an extra 15 to 30 minute walk each day can help.  " Maintaining regular bowel habits.  Try to have a bowel movement at the same time every day, but do not strain (push really hard) to get it out.  Sometimes a hot drink helps.  " Checking with your HCP about using a stool softener:  these medications make your bowel movement easier to get out.   Do no use laxative or enemas unless your HCP has ordered a special kind for you,    Frequent Urination  During early pregnancy you will notice that you have to go to the bathroom to urinate more often.  This is due to hormones in your body and to the growing uterus pushing on your bladder.  This frequency is   normal but tell your HCP if you notice any burning or pain when you urinate.     Breast Tenderness  Your breasts get larger during pregnancy as the milk glands develop.  Sometime you feel a tingling or throbbing sensation.  These changes are normal.   If you wear a bra, make sure it supports you without being too tight.     Vaginal Discharge  The change in your hormone levels makes a change in the cells in your vagina.   This causes an increase in vaginal secretions.  The discharge  is usually thin and clear or white in color.  It should not have any odor.  Things you can do to help yourself feel more comfortable include the following:  " Keep your vulva (private area) clean and dry.  " Do not wear tight clothing or too many layers of clothing.   " Wear cotton underwear  " Do not douche or use feminine hygiene products or strong soaps.  " Report irritation, odor or a  colored discharge to your HCP.    Warning Signs  Most women do not have problems during their pregnancy; however, sometimes a problem can occur.  Be sure to call your health care provider (HCP) right away so that any possible treatment can be started.  Warning signs in the first trimester include:  " Leakage of fluid from the vagina  " Bleeding from the vagina, with or without passing any blood clots or tissue.  " Fever of more than 100.4 degrees F or 38.0 degrees C  " Abdominal or pelvic (area between your hips) pain   " Severe back pain that does not go away with rest  " Nausea and vomiting that lasts more than 1 day.  " Tea-colored urine or not urinating often enough  " Severe headache  " Fainting or blackouts    If you have any of these symptoms, call our office right away.     Reviewed 12/2009      Approved Medication for the Pregnant Women:    Analgesia (Pain Relief )                    Tylenol  Flexiril    Antibiotics:  Amoxicillin  Penicillin  Macrobid  Erythromycin  Keflex    Asthma  Isoproterenol, Proventil, Beconase  Ventolin, Cromolyn    Cold Remedies  Sudafed (in 2nd and 3rd trimester use only)  Robitussin (guaifenesin) (?not 1st tri)  Vicks Vapor rub  Benadryl  Throat Lozenges (any brand)  Afrin Nasal Spray (3 day limit)    Allergies:  Zyrtec  Loratadine    Constipation:  Milk of Magnesia  Metamucil  Colace (docusate sodium)  Fiber laxatives    Diarrhea:  Kaopectate  Imodium (not 1st tri)   Hemorrhoids  Proctofoam  Preparation H  Anusol  Tucks    Indigestion  Tums  Maalox  Rolaids  Mylanta  Riopan plus simethicone  Pepcid  Zantac  Tagamet    Miscellaneous  Sulfacetamide eye drops   Hydrocortizone cream 0.5 %  Novocaine-lidocaine  (with epinephrine, if necessary)    Nausea & Vomiting  Emetrol  Gatorade  Reglan  Compazine  Phenergan  Zofran ODT  Dramamine  Meclizine  Vitamin B6, 25 mg. tablets: ½ tablet 3 times a day.  Peppermint and ginger  tea  Peppermint    UTI:  Keflex  Erythromycin  Macrobid  Augmentin
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
## 2015-11-06 ENCOUNTER — Other Ambulatory Visit: Payer: Self-pay

## 2015-11-06 ENCOUNTER — Telehealth: Payer: Self-pay | Admitting: Internal Medicine

## 2015-11-06 NOTE — Telephone Encounter (Signed)
Returned call to Ms. Jenna [name]   She is having an exacerbation of hydradenitis in both axillae and the groin near the vagina   She is [redacted] weeks pregnant and was advised to stop minocycline that was working very well to suppress the hydradenitis  She is [redacted] weeks pregnant and is scheduled for ultrasound today and visit with Dr. Virgia Landhornburg tomorrow   She denies fever, chills, nausea, vomiting  The axillae is draining a little bit     Message routed to Dr. Leodis LiverpoolPortuguese

## 2015-11-06 NOTE — Telephone Encounter (Signed)
Patient is calling to speak with a nurse.    She is experiencing flare ups, arms and groin area is swollen and full of bumps (hidradenitis )    Patient can be reached at [phone]

## 2015-11-07 ENCOUNTER — Ambulatory Visit: Payer: Self-pay | Admitting: Obstetrics and Gynecology

## 2015-11-11 ENCOUNTER — Telehealth: Payer: Self-pay

## 2015-11-11 NOTE — Telephone Encounter (Signed)
Duwaine Maxinshley Stefano is a [age] year woman, [redacted] weeks pregnant, with severe hidradenitis. She had a recent OBGyn appointment, but her hidradenitis was not addressed. Please let the patient know that she definitely should not take minocycline, previously prescribed in IM clinic, because exposure during pregnancy might cause maternal liver problems and may impair fetal bone growth. She should focus on keeping the areas clean and dry. In this regard, over the counter chlorhexidine (e.g., Hibiclens) rinses are safe and might be effective for her, and she can do these several times per week. If this doesn't work, we have an appointment on 9/12 and could try topical antibiotics.     Thank you,   Marcello Fennelndrew     Spoke to Ms. Katrinka BlazingSmith   She was just evicted from her apartment  and living in a motel with her fiance   She has no money for [employer]  [facility]    [facility] and they can dispense this for her     message routed to Dr. Leodis LiverpoolPortuguese to write script to [facility]   Message routed to [facility] to supply voucher to pharmacy

## 2015-11-11 NOTE — Telephone Encounter (Signed)
[facility]  PHARMACY FORM     Todays date:  November 11, 2015    Patient Name: Jenna [name]      Medical Record #: [id number]   DOB: 08/22/1985  Patients Address: [address]                Social Worker: Suzzanne CloudAnita O Arlene Genova       Date of Service: November 11, 2015       Funding Source: [place] Medication Assistance Fund(For Hibliclens med)  ___________________________________________________________________    Pharmacy Information:  Date/time sent: November 11, 2015     Time needed: ASAP    Patient Location: Outpatient    Medication Pick-up Preference: Patient will pick up at the pharmacy    Pharmacy Contact: Lupita Leashonna  Social work signature: Suzzanne CloudAnita O Daxtyn Rottenberg  Supervisor/Manager Approval (if indicated):  Date:  (supervisor signature not required for Medicaid pending)  Synetta FailAnita Dwane Andres,LMSW  AC-[employer] Coverage

## 2015-11-15 ENCOUNTER — Telehealth: Payer: Self-pay | Admitting: Internal Medicine

## 2015-11-15 MED ORDER — ACETAMINOPHEN 500 MG PO TABS *I*
500.0000 mg | ORAL_TABLET | Freq: Four times a day (QID) | ORAL | 5 refills | Status: DC | PRN
Start: 2015-11-15 — End: 2015-11-18

## 2015-11-15 MED ORDER — CHLORHEXIDINE GLUCONATE 4 % EX LIQD *WRAPPED*
Freq: Every day | CUTANEOUS | 0 refills | Status: DC | PRN
Start: 2015-11-15 — End: 2015-11-18

## 2015-11-15 NOTE — Telephone Encounter (Signed)
1:30 pm After Hours Call    Chief complaint: pain from hidradenitis of the groin    History:  Jenna [name] is a 30 y.o. woman who is [redacted] weeks pregnant and has a history of depression, substance abuse, obesity, hidradenitis who is calling with ongoing/worsening pain from her hidraddentitis. She reports her pain has worsened since she stopped taking minocycline. She has been unable to afford the chlorhexidine rinses recommended by CCP because they are over the counter. She states her lesions are draining, but that the drainage is not worse than usual. She denies fevers, chills, nausea, vomiting, or dysuria. She is tearful but adamantly denies suicidal ideation or thoughts of hurting herself.     Assessment/plan:  Acute worsening of hidradenitis pain in the setting of stopping minocycline and stopping NSAIDs. She does not sound acutely infected now. Options for managing hydradenitis in pregnancy are limited as tetracyclines are contraindicated and other antibiotics such as rifampin are pregnancy class C. We could consider clindamycin but would not like to start an antibiotic without evaluation. For now, she is stable.    -I sent a prescription for chlorhexidine washes in the hopes that this will be covered  -advised her to call if she develops fevers, chills, dysuria, malaise  -reordered tylenol. Discussed max dose is 6 pills/day (3 g) of the 500 mg tabs  -do not use NSAIDs during pregnancy  -if pain is still worsening would consider brining her in for urgent care visit, as she has had a change from previously well-managed disease

## 2015-11-18 ENCOUNTER — Other Ambulatory Visit: Payer: Self-pay | Admitting: Internal Medicine

## 2015-11-18 MED ORDER — CHLORHEXIDINE GLUCONATE 4 % EX LIQD *WRAPPED*
Freq: Every day | CUTANEOUS | 0 refills | Status: DC | PRN
Start: 2015-11-18 — End: 2015-11-24

## 2015-11-18 MED ORDER — ACETAMINOPHEN 500 MG PO TABS *I*
500.0000 mg | ORAL_TABLET | Freq: Four times a day (QID) | ORAL | 5 refills | Status: DC | PRN
Start: 2015-11-18 — End: 2015-12-11

## 2015-11-18 NOTE — Telephone Encounter (Signed)
Rx request routed to provider on 11/18/2015 at 12:14 PM

## 2015-11-18 NOTE — Telephone Encounter (Signed)
Jenna MaxinAshley Coglianese calling to request prescription(s)   Requested Prescriptions     Pending Prescriptions Disp Refills    chlorhexidine (HIBICLENS) 4 % external liquid 120 mL 0     Sig: Apply topically daily as needed    acetaminophen (TYLENOL) 500 mg tablet 120 tablet 5     Sig: Take 1 tablet (500 mg total) by mouth every 6 hours as needed for Pain    to be sent to [facility] - [place], [place] - [address]    Is patient out of the medication? yes  Does the patient have questions regarding the medication for the nurse? no    Patient can be reached if necessary at [phone]

## 2015-11-19 ENCOUNTER — Other Ambulatory Visit: Payer: Self-pay | Admitting: Internal Medicine

## 2015-11-19 NOTE — Progress Notes (Signed)
Unable to reach pt.    Immunizations:  Medicare?  no       Pneumovax/Smoker                                                                             If > 30 yo   Overdue: Yes        Agrees to schedule:  unknown    *If no Pneumovax, then Prevnar 13 (PCV23)     Order code: [id number]   If Prevnar 13 then Pneumovax > or = 12 month later (PVC12)    Order code: [id number]    Information:  For Tdap and Zostavax with Medicare:  Medicare and/or dual eligible patients should be directed to their pharmacy to receive these vaccines (a copay may apply - though usually minimal).   • Local pharmacies that have all vaccinations available include:   o [facility], [facility], [facility], [facility], [facility] (dependent upon location)

## 2015-11-20 ENCOUNTER — Other Ambulatory Visit: Payer: Self-pay

## 2015-11-24 ENCOUNTER — Inpatient Hospital Stay
Admission: EM | Admit: 2015-11-24 | Disposition: A | Discharge status: Home / Self Care | Payer: Self-pay | Source: Ambulatory Visit | Attending: Psychiatry | Admitting: Psychiatry

## 2015-11-24 ENCOUNTER — Encounter: Payer: Self-pay | Admitting: Psychiatric/Mental Health

## 2015-11-24 ENCOUNTER — Observation Stay
Admission: AD | Admit: 2015-11-24 | Disposition: A | Discharge status: Home / Self Care | Payer: Self-pay | Source: Ambulatory Visit | Attending: Obstetrics and Gynecology | Admitting: Obstetrics and Gynecology

## 2015-11-24 DIAGNOSIS — O0970 Supervision of high risk pregnancy due to social problems, unspecified trimester: Secondary | ICD-10-CM

## 2015-11-24 DIAGNOSIS — J45909 Unspecified asthma, uncomplicated: Secondary | ICD-10-CM | POA: Insufficient documentation

## 2015-11-24 DIAGNOSIS — B379 Candidiasis, unspecified: Secondary | ICD-10-CM | POA: Diagnosis present

## 2015-11-24 LAB — CHEMICAL DEPENDENCY SCREEN 8, URINE
Amphetamine,UR: NEGATIVE
Barbiturate,UR: NEGATIVE
Benzodiazepinen,UR: NEGATIVE
Cocaine/Metab,UR: POSITIVE
Methadone Metab,UR: NEGATIVE
Opiates,UR: NEGATIVE
PCP,UR: NEGATIVE
Propoxyphene,UR: NEGATIVE
THC Metabolite,UR: NEGATIVE

## 2015-11-24 LAB — URINALYSIS REFLEX TO CULTURE
Blood,UA: NEGATIVE
Ketones, UA: NEGATIVE
Leuk Esterase,UA: NEGATIVE
Nitrite,UA: NEGATIVE
Protein,UA: NEGATIVE mg/dL
Specific Gravity,UA: 1.024 (ref 1.002–1.030)
pH,UA: 6 (ref 5.0–8.0)

## 2015-11-24 MED ORDER — FLUCONAZOLE 150 MG PO TABS *I*
150.0000 mg | ORAL_TABLET | ORAL | Status: DC
Start: 2015-11-24 — End: 2015-11-27
  Administered 2015-11-25: 150 mg via ORAL
  Filled 2015-11-24 (×2): qty 1

## 2015-11-24 MED ORDER — ALBUTEROL SULFATE HFA 108 (90 BASE) MCG/ACT IN AERS *I*
2.0000 | INHALATION_SPRAY | Freq: Four times a day (QID) | RESPIRATORY_TRACT | Status: DC | PRN
Start: 2015-11-24 — End: 2015-12-01

## 2015-11-24 MED ORDER — PRENATAL PLUS IRON 29-1 MG PO TABS *I*
1.0000 | ORAL_TABLET | Freq: Every day | ORAL | Status: DC
Start: 2015-11-25 — End: 2015-12-16
  Administered 2015-11-25 – 2015-12-16 (×22): 1 via ORAL
  Filled 2015-11-24 (×24): qty 1

## 2015-11-24 MED ORDER — FLUCONAZOLE 150 MG PO TABS *I*
150.0000 mg | ORAL_TABLET | ORAL | 0 refills | Status: DC
Start: 2015-11-24 — End: 2015-12-11

## 2015-11-24 MED ORDER — DIPHENHYDRAMINE HCL 25 MG PO TABS *I*
50.0000 mg | ORAL_TABLET | Freq: Once | ORAL | Status: AC
Start: 2015-11-24 — End: 2015-11-24
  Administered 2015-11-24: 50 mg via ORAL
  Filled 2015-11-24: qty 2

## 2015-11-24 MED ORDER — CHLORHEXIDINE GLUCONATE 4 % EX LIQD *WRAPPED*
Freq: Every day | CUTANEOUS | 0 refills | Status: DC | PRN
Start: 2015-11-24 — End: 2015-12-11

## 2015-11-24 MED ORDER — ONDANSETRON 4 MG PO TBDP *I*
4.0000 mg | ORAL_TABLET | Freq: Three times a day (TID) | ORAL | 0 refills | Status: DC | PRN
Start: 2015-11-24 — End: 2015-12-11

## 2015-11-24 NOTE — CPEP Notes (Signed)
History of Present Illness:   Patient seen by Woody Sellerarrie Neetu Carrozza, LMSW on 11/24/2015 at 8:21 PM    Duwaine Maxinshley Yera is a 30 y.o., Single [1], female who presents to CPEP involuntarily accompanied by spouse with complaint/report of increased depression and suicidal thoughts.  Events leading to CPEP presentation include pt reported that she has been increasingly stressed recently for multiple reasons.  She reported that she has been in chronic pain, found out her mother has cancer, and is having thoughts of wanting to harm herself.  She reported that recently, her boyfriend had to take pills away from her as she had wanted to overdose.  Pt reported that she has just been feeling tired of being stressed out all the time.  Pt reported that she has been doing better with not using substances recently, but did report that she used last week.  Pt reported that she has been staying at hotels with her boyfriend.  Pt was very irritable throughout the interview process and often responded with not wanting to talk about things.  She did become tearful at some points in regards to her stressors and past trauma in her life.  Pt was agreeable to writer completing a care management referral.  Patient is moderately cooperative with the evaluation process and  engages with Clinical research associatewriter. Patient presents with depression and suicidal thoughts/threats. Onset of symptoms was gradual starting several weeks ago.  Symptoms are of mild and are unchanged. Patient states symptoms have been exacerbated by relationship problem with different family members, financial burdens, chemical dependency, chronic pain/pain management, lack of support and transition in between housing at this time.     Woody Sellerarrie Kavina Cantave, LMSW
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
## 2015-11-24 NOTE — Discharge Instructions (Signed)
Reason for Triage Visit: Abdominal pain  Destination: Home  Mode: Ambulatory    Procedures done in triage: Sterile speculum exam and Ultrasound  Pending Laboratory Data: Urine culture and prenatal profile, HIV, lead, TSH    Diagnosis:   Active Hospital Problems    Diagnosis    Yeast infection    Supervision of high risk pregnancy due to social problems     - 699w2d gestation by LMP consistent with 10w CRL in triage on 9/11    <> Prenatal labs ordered in triage on 9/11      Cocaine abuse     - UCDS (11/24/15): + Cocaine      Tobacco abuse    Obesity     - BMI 49    <> Needs early Glucola @ 13w        Resolved Hospital Problems    Diagnosis   No resolved problems to display.        Diet: Regular    Activity: Regular activity    Special Instructions: Call or return to triage if you have worsening of any of your symptoms, if you have more than 6 contractions in an hour, if you have vaginal bleeding or leakage of clear fluid.     If you cannot reach your OB/Gyn or Midwife, call the Labor and Delivery Unit or 911 if it is an emergency.    [facility] as previously instructed.

## 2015-11-24 NOTE — ED Notes (Signed)
11/24/15 2020   Admission   CPEP Legal Status on Arrival Voluntary   *Mode of Arrival Ambulatory   First Contact 2020   Admitting Procedure   *Belonging Search Yes   *Belongings Searched By Ennis Fortsjessie johnston   *Patient Checked for Contraband Belongings Checked;Body Scanned with Wand;Clothing Checked   *Oriented to Unit Yes   *Information/Orientation Given Program orientation   15 Minute Checks Maintained Yes   Belongings   Dentures None   Vision - Corrective Lenses None   Hearing Aid/Cochlear Implant None   Jewelry None   Clothing Clothing Room/Cupboard   Other Valuables Purse;Wallet;Cell phone  (shoes, 2 duffel bags, extra clothes, deep fryer, snacks, toiletries, nailclippers, razor)   Prosthesis / Assistive Devices None   Valuables Given To None   Cashier's Office No

## 2015-11-24 NOTE — Progress Notes (Signed)
Pt discharged to CPEP in custody of security accompanied by fiance.  PT is requesting to go to CPEP stating she is really depressed, pt denies current thoughts of hurting herself however states that she may feel differently later today and would like to be admitted.  CPEP aware and awaiting her arrival per Dr. Donald Proseconnell. Steva ColderMichelle Tonishia Steffy, RN
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
## 2015-11-24 NOTE — ED Triage Notes (Signed)
Pt SI, from OB, [redacted] weeks pregnant     Triage Note   Luster LandsbergMichael A Arlana Canizales, RN

## 2015-11-24 NOTE — OB Triage Note (Addendum)
OBSTETRICS TRIAGE NOTE      Chief Complaint: I am not feeling well.     HPI     Jenna [name] is a 30 y.o. [id number] at [redacted]w[redacted]d by LMP (though not clear regarding date and reports a history of irregular menses) presents to [facility] triage with complaints on "not feeling well" and being homeless. She states that she has been having nausea for three days but what brought her in is pain in her flanks bilaterally radiating to her back. She denies fever, vaginal bleeding, but endorses dysuria and frequency. She also reports she has thick white colored discharge without pruritis with odor.      She states that she was living with [name] at [facility] house but now just living "on the streets". She endorses cocaine use, most recently three days ago. 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      Pregnancy Risks     Asthma  Hydradenitis   Depression without SI or HI (previosuly on Prozac and Seroquel-run out of meds and pt reports PCP told her to stop).    Currently homeless  Current tobacco use~ 3 cigarettes per day  History of three cesarean cesarean  History of infant demise due to SID at 5 months  Cocaine use, reports last use was 3 days ago  History of prostitution     Obstetrical History     OB History   Gravida Para Term Preterm AB Living   7 5 5  1 4    SAB TAB Ectopic Multiple Live Births    1   5      # Outcome Date GA Lbr Len/2nd Weight Sex Delivery Anes PTL Lv   7 Current            6 Term 10/27/12 [redacted]w[redacted]d   M CSLT GENERAL-  LIV   5 Term 11/26/09 [redacted]w[redacted]d  3549 g (7 lb 13.2 oz) F CS-LTranv Spinal N LIV      Birth Comments: Repeat C/S, HTN   4 Term 08/27/07 [redacted]w[redacted]d 07:00 4082 g (9 lb) M CS-LTranv EPI  LIV      Birth Comments: FTP, HTN   3 Term 06/23/06 [redacted]w[redacted]d 04:00 3714 g (8 lb 3 oz) M Vag-Vacuum   DEC      Birth Comments: fetal distress, SIDS death at 5months, HTN   2 Term 07/04/03 [redacted]w[redacted]d 02:00 3260 g (7 lb 3 oz) M Vag-Spont EPI  LIV      Birth Comments: preeclampsia   1 TAB                      PMH, PSH, SH, GYN History, Allergies, and Current Medications reviewed and updated in eRecord.       Review of Systems     Constitutional: Negative for fever or fatigue.  Psych: Denies depressive symptoms or anxiety.  Cardiovascular: Denies chest pain or palpitations.  Respiratory: Denies shortness of breath or orthopnea.  Gastrointestinal: Has nausea/vomiting.  Has abdominal pain.  Has flank pain.  Musculoskeletal: Denies muscle weakness.  Skin/Extremities: Denies peripheral edema.  Denies new skin rashes or lesions.  Neurologic: Denies headaches or vision changes.  Denies syncope.  Denies recent falls.  Genitourinary: Has dysuria.  Has urinary frequency.  Denies hematuria.  Denies vaginal bleeding.  Denies leakage of fluid.        Prenatal Labs       Physical Exam     Vitals:  11/24/15 1149   BP: 115/58   Pulse: 70   Resp: 18   Temp: 36.2 C (97.2 F)   TempSrc: Temporal       General Appearance: healthy, alert and active  Mental Status: Alert and oriented x 3  Mood/Affect: Affect  laughing and interacting appropriately   HEENT: Normocephalic, atraumatic.  Cardiovascular: regular rate  Respiratory: normal respiratory effort  Abdomen: soft, ND, NT no CVA tenderness, No evidence of umbilical hernia, no evidence of gross hepatomegaly or tenderness at the liver edge.  Neurological: Not assessed  Extremities: normal    External Genitalia: Unremarkable external genitalia without lesions.  Normal appearing labia majora/minora and introitus.  Vagina: Pink, ruggated vaginal mucosa without lesions.  White colored discharge.  No bleeding.  Cervix: Cervical examination as per below.  No lesions.  No leakage of fluid.    Cervical Examination: closed    Sterile Speculum Exam:  Pooling: negative  Nitrazine: unable to perform  Ferning: negative  Membranes: intact  Wet Prep: no pathogens on today's wet prep    Fetal Monitoring:  Doptone present on bedside US    Assessment & Plan     Jenna [name] is a 30 y.o. [id number] at  324w2d with pregnancy complicated by risks outlined above who presents with abdominal pain and social concerns.    Abdominal/Flank pain:  - unremarkable on exam  - UA negative, Urine culture pending  - previously culture positive yeast, [facility]    Prenatal care:  - Encouraged patient to keep existing appointment for US and visit on 9/14  - bedside informal transabdominal US shows CRL consistent with gestational age by LMP but will need formal scan  - prenatal labs ordered, will draw in triage  - UCDS notable for cocaine   - Nausea: zofran OTD Rx sent.     Homelessness:  - patient seen by OB social worker, information provided    Patient seen and d/w Dr. Purvis KiltsMorrison    Appollonia Klee, MD  OB[place]N, [place]  Pager # [phone]  11/24/2015  5:30 PM
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
## 2015-11-24 NOTE — CPEP Notes (Signed)
SW Collateral Note    [name] - Boyfriend - [phone]  Writer spoke with pt's boyfriend who reported that he is very concerned for pt.  He stated that pt is very depressed at this time and he is worried about her engaging in risky behavior that may not only put her at risk, but also their unborn child.  He reported that pt's family is not a positive support for her as they enable her substance use.  He reported that he does the best that he can to shield her from them, but sometimes she'll leave for days at a time and go with them.  He reported that this is pt's "uncaring mode" where she is reckless and doesn't care what happens to her.  He confirmed that a few weeks ago pt had tried to lock herself in the bathroom with a bottle of pills and overdose, but he was able to get the medications from her.  He reported that he is a Cytogeneticist[employer]eteran and has a house through Section 8 that they will be moving into on October 1st.  He want's pt to get support and worries for her at this time.  He confirmed that pt's mother was just diagnosed with cancer which is stressful for pt.  He would like to be kept updated on the plan for pt and is agreeable to her staying the night.  He asked that Clinical research associatewriter called his care manager to inform him where he has been.    SW Completed  Psychosocial/Purple Sheet  DIRA/Psych History  Care Management Referral  Collateral with Boyfriend  Email to OBGYN SW    Woody Sellerarrie Jessa Stinson, LMSW

## 2015-11-25 ENCOUNTER — Ambulatory Visit: Payer: Self-pay | Admitting: Student in an Organized Health Care Education/Training Program

## 2015-11-25 LAB — COMPREHENSIVE METABOLIC PANEL
ALT: 13 U/L (ref 0–35)
AST: 13 U/L (ref 0–35)
Albumin: 3.8 g/dL (ref 3.5–5.2)
Alk Phos: 78 U/L (ref 35–105)
Anion Gap: 11 (ref 7–16)
Bilirubin,Total: <0.2 mg/dL (ref 0.0–1.2)
CO2: 27 mmol/L (ref 20–28)
Calcium: 9.8 mg/dL (ref 8.8–10.2)
Chloride: 100 mmol/L (ref 96–108)
Creatinine: 0.97 mg/dL — ABNORMAL HIGH (ref 0.51–0.95)
GFR,Black: 91 *
GFR,Caucasian: 79 *
Glucose: 105 mg/dL — ABNORMAL HIGH (ref 60–99)
Lab: 16 mg/dL (ref 6–20)
Potassium: 4.7 mmol/L (ref 3.3–5.1)
Sodium: 138 mmol/L (ref 133–145)
Total Protein: 7.1 g/dL (ref 6.3–7.7)

## 2015-11-25 LAB — CBC AND DIFFERENTIAL
Baso # K/uL: 0.1 10*3/uL (ref 0.0–0.1)
Basophil %: 0.5 %
Eos # K/uL: 0.2 10*3/uL (ref 0.0–0.4)
Eosinophil %: 1.6 %
Hematocrit: 31 % — ABNORMAL LOW (ref 34–45)
Hemoglobin: 9.2 g/dL — ABNORMAL LOW (ref 11.2–15.7)
IMM Granulocytes #: 0 10*3/uL (ref 0.0–0.1)
IMM Granulocytes: 0.3 %
Lymph # K/uL: 2.5 10*3/uL (ref 1.2–3.7)
Lymphocyte %: 23.8 %
MCH: 24 pg/cell — ABNORMAL LOW (ref 26–32)
MCHC: 30 g/dL — ABNORMAL LOW (ref 32–36)
MCV: 79 fL (ref 79–95)
Mono # K/uL: 0.7 10*3/uL (ref 0.2–0.9)
Monocyte %: 7 %
Neut # K/uL: 6.9 10*3/uL — ABNORMAL HIGH (ref 1.6–6.1)
Nucl RBC # K/uL: 0 10*3/uL (ref 0.0–0.0)
Nucl RBC %: 0 /100 WBC (ref 0.0–0.2)
Platelets: 356 10*3/uL (ref 160–370)
RBC: 3.9 MIL/uL (ref 3.9–5.2)
RDW: 17.4 % — ABNORMAL HIGH (ref 11.7–14.4)
Seg Neut %: 66.8 %
WBC: 10.3 10*3/uL — ABNORMAL HIGH (ref 4.0–10.0)

## 2015-11-25 LAB — TSH: TSH: 0.58 u[IU]/mL (ref 0.27–4.20)

## 2015-11-25 LAB — COCAINE, URINE, CONFIRMATION: Confirm COC/METAB: POSITIVE

## 2015-11-25 LAB — AEROBIC CULTURE: Aerobic Culture: 0

## 2015-11-25 MED ORDER — CHLORHEXIDINE GLUCONATE 4 % EX LIQD *WRAPPED*
Freq: Every day | CUTANEOUS | Status: DC | PRN
Start: 2015-11-25 — End: 2015-11-27

## 2015-11-25 MED ORDER — ALUM & MAG HYDROXIDE-SIMETH 200-200-20 MG/5ML PO SUSP *I*
30.0000 mL | Freq: Three times a day (TID) | ORAL | Status: DC | PRN
Start: 2015-11-25 — End: 2015-12-16
  Administered 2015-11-26: 30 mL via ORAL

## 2015-11-25 MED ORDER — ACETAMINOPHEN 325 MG PO TABS *I*
650.0000 mg | ORAL_TABLET | Freq: Four times a day (QID) | ORAL | Status: DC | PRN
Start: 2015-11-25 — End: 2015-12-16
  Administered 2015-11-25 – 2015-12-15 (×23): 650 mg via ORAL
  Filled 2015-11-25 (×2): qty 2

## 2015-11-25 MED ORDER — ONDANSETRON 4 MG PO TBDP *I*
4.0000 mg | ORAL_TABLET | Freq: Three times a day (TID) | ORAL | Status: AC | PRN
Start: 2015-11-25 — End: 2015-11-30
  Administered 2015-11-25 – 2015-11-29 (×4): 4 mg via ORAL
  Filled 2015-11-25 (×5): qty 1

## 2015-11-25 MED ORDER — DIPHENHYDRAMINE HCL 25 MG PO TABS *I*
50.0000 mg | ORAL_TABLET | Freq: Two times a day (BID) | ORAL | Status: DC | PRN
Start: 2015-11-25 — End: 2015-11-27
  Administered 2015-11-25 – 2015-11-26 (×2): 50 mg via ORAL
  Filled 2015-11-25 (×2): qty 2

## 2015-11-25 MED ORDER — MAGNESIUM HYDROXIDE 400 MG/5ML PO SUSP *I*
30.0000 mL | Freq: Every day | ORAL | Status: DC | PRN
Start: 2015-11-25 — End: 2015-12-16
  Administered 2015-11-26 – 2015-12-09 (×3): 30 mL via ORAL

## 2015-11-25 NOTE — CPEP Notes (Signed)
Pt slept overnight, maintained on Q15 minute safety rounds and provided with comfort measures as appropriate to her needs.  No safety or behavioral concerns.

## 2015-11-25 NOTE — CPEP Notes (Signed)
Jenna SallesKim [name] from [facility] can be text-paged through the system. Jenna Regulatory affairs officercall writer in attempt to contact SW Jenna Sellerarrie [name], who left Jenna BattenKim a voice message overnight.

## 2015-11-26 DIAGNOSIS — Z3A10 10 weeks gestation of pregnancy: Secondary | ICD-10-CM | POA: Insufficient documentation

## 2015-11-26 NOTE — ED Notes (Signed)
11/26/15 1414   Disposition details   [facility]   Family notification done via phone   Name of family member notified (husband)   Transportation arranged NA- patient admitted   Patient belongings  given to inpatient staff

## 2015-11-26 NOTE — ED Notes (Signed)
Report called to charge RN on [phone]
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
## 2015-11-26 NOTE — ED Notes (Signed)
Patient informed about admission process and status of rights was placed in her property at her request.

## 2015-11-26 NOTE — CPEP Notes (Signed)
Pt maintained on q 15 min safety checks throughout night shift.

## 2015-11-26 NOTE — Telephone Encounter (Signed)
Covering Child psychotherapistocial Worker did voucher    Copy is attached below  --------------------------------------------------------     [facility] SOCIAL WORK  PHARMACY FORM     Todays date:  November 11, 2015    Patient Name: Jenna [name]                                                          Medical Record #: [id number]   DOB: 02/09/1986  Patients Address: [address]                               Social Worker: Suzzanne CloudAnita O Dorsey                                                                 Date of Service: November 11, 2015    Funding Source: SW Medication Assistance Fund(For Hibliclens med)  ___________________________________________________________________    Pharmacy Information:  Date/time sent: November 11, 2015                                                              Time needed: ASAP    Patient Location: Outpatient    Medication Pick-up Preference: Patient will pick up at the pharmacy    Pharmacy Contact: Lupita Leashonna  Social work signature: Suzzanne CloudAnita O Dorsey  Supervisor/Manager Approval (if indicated):                                        Date:  (supervisor signature not required for Medicaid pending)  Synetta FailAnita Dorsey,LMSW  AC-[employer] Coverage            Electronically signed by Suzzanne Cloudorsey, Anita O at 11/11/2015 10:43 AM

## 2015-11-26 NOTE — Plan of Care (Addendum)
Care Plan Goals      Risk for Suicide       Patient Will Verbalize Understanding Of Role Of Medications In Stabilizing Symptoms By Day 3 Progressing towards goal       Patient Will Identify 2 Alternative Ways Of Dealing With Stress And Emotional Problems By Day 3 Progressing towards goal       Patient Will Be Safe And Free From Injury Throughout Hospitalization Maintaining        Pt arrived to escorted by unit staff and public safety via wheelchair from CPEP at approximately 2:30 pm. Pt immediately asking for her belongings and food and stating she didn't like her room becoming tearful. Pt anxious but cooperative. Loud at times. Verbalizes history of previous admissions on [phone] and states she needs her "mental medications" but stopped taking them previously because she didn't think she needed them anymore. Denies current SI/HI/AVH stating, "not yet" when asked about AVH. Admission e-record questions completed. Fiance into visit shortly after arrival bringing large bag of snacks and food, some which pt was told could not be kept due to need to refrigerate. Fiance said that pt would eat it all and it wouldn't need to be refrigerated both pt and fiance stating "she eats a lot because she's pregnant." Wants room change tomorrow when possible.  Fredrich RomansKatlin J Saralynn Langhorst, RN  11/26/2015  4:25 PM  Evening 11/26/15: 4pm-8pm Pts boyfriend made himself very comfortable on the unit, removing shoes and taking a nap on floor in patient's room; did patient's laundry for her in unit laundry room. Pt unhappy that she didn't feel she got enough food at dinner (had received double portions of chicken fingers and french fries); pt c/o abdominal pain (pointed to stomach) and when writer suggested that it was because she had had a lot to eat pt said "no, I haven't had a lot to eat." Denies any current SI/HI/AVH. Writer reviewed visiting hours (2-4, 6-8) with pt's boyfriend prior to him leaving and encouraged pt to visit only between these  times tomorrow so pt could attend groups. Pt has many somatic complaints; took tylenol and then was able to fall asleep and take nap.   Fredrich RomansKatlin J Korin Hartwell, RN  11/26/2015  8:49 PM

## 2015-11-27 ENCOUNTER — Ambulatory Visit: Payer: Self-pay | Admitting: Obstetrics and Gynecology

## 2015-11-27 ENCOUNTER — Ambulatory Visit: Payer: Self-pay

## 2015-11-27 MED ORDER — SERTRALINE HCL 50 MG PO TABS *I*
50.0000 mg | ORAL_TABLET | Freq: Every day | ORAL | Status: DC
Start: 2015-11-27 — End: 2015-12-05
  Administered 2015-11-27 – 2015-12-05 (×9): 50 mg via ORAL
  Filled 2015-11-27 (×9): qty 1

## 2015-11-27 MED ORDER — TRAZODONE HCL 50 MG PO TABS *I*
50.0000 mg | ORAL_TABLET | Freq: Every evening | ORAL | Status: DC
Start: 2015-11-27 — End: 2015-12-10
  Administered 2015-11-27 – 2015-12-09 (×13): 50 mg via ORAL
  Filled 2015-11-27 (×14): qty 1

## 2015-11-27 MED ORDER — CHLORHEXIDINE GLUCONATE 4 % EX LIQD *WRAPPED*
Freq: Every day | CUTANEOUS | Status: DC
Start: 2015-11-28 — End: 2015-12-16

## 2015-11-27 MED ORDER — FLUCONAZOLE 150 MG PO TABS *I*
150.0000 mg | ORAL_TABLET | Freq: Once | ORAL | Status: AC
Start: 2015-11-28 — End: 2015-11-28
  Administered 2015-11-28: 150 mg via ORAL
  Filled 2015-11-27: qty 1

## 2015-11-27 MED ORDER — FLUCONAZOLE 150 MG PO TABS *I*
150.0000 mg | ORAL_TABLET | Freq: Once | ORAL | Status: DC
Start: 2015-11-27 — End: 2015-11-27
  Filled 2015-11-27: qty 1

## 2015-11-27 NOTE — Progress Notes (Addendum)
Notes and handoff reviewed and discussed with MD and SW.  Pt seen this morning and plan of care discussed with current attending physician Dr. Charna ElizabethLisa Slimmer and Rhett BannisterJennifer Castiglione, LMSW. Notes and handoff reviewed and discussed with MD and SW. Writer reviewed MIPS H/P and labs.  Descriptive Sentence  Jenna [name] is a 30 y.o. female [redacted] weeks pregnant.with history significant for depression, prior SI/attempt, and substance use.admitted with worsening depression and SI.      Active Issues  Depression  Tearful  SI  Pregnant  Insomnia  Homeless  Cysts  Yeast infection  Multiple psychosocial stressors        To Do List  Start Zoloft 50 mg  Start Trazodone 50 mg HS for sleep  Discontinue Benadryl  Gather collateral              Anticipatory Guidance  9.13  SP      Attending Handoff

## 2015-11-27 NOTE — Plan of Care (Signed)
Risk for Suicide     Patient Will Be Safe And Free From Injury Throughout Hospitalization Maintaining          Risk for Suicide     Patient Will Verbalize Understanding Of Role Of Medications In Stabilizing Symptoms By Day 3 Progressing towards goal     Patient Will Identify 2 Alternative Ways Of Dealing With Stress And Emotional Problems By Day 3 Progressing towards goal          Eve 11/27/2015 20:30 - 00:30: Pt denied SI/HI/AVH. Friendly and cooperative; engages well. Med compliant. PRN tylenol for right arm pain with some effect.

## 2015-11-27 NOTE — Plan of Care (Signed)
Risk for Suicide     Patient Will Be Safe And Free From Injury Throughout Hospitalization Maintaining          Risk for Suicide     Patient Will Verbalize Understanding Of Role Of Medications In Stabilizing Symptoms By Day 3 Progressing towards goal     Patient Will Identify 2 Alternative Ways Of Dealing With Stress And Emotional Problems By Day 3 Progressing towards goal        Night 11/27/15  Admission completed using notes.  Patient awoke at approximately 0215 asking for milk.  Water and crackers offered.  Patient stated, "I am pregnant.  I can't get milk?  I got all the snacks I need in my room."  Patient informed of unit policy that only water and crackers are available at night.  Stated, "Well then you need to ask my doctor to get me milk."  Patient accepted water.  Patient returned to room mumbling about not being able to get milk when she wants.  Micheline MazeBrian Zuri Bradway, RN
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
## 2015-11-27 NOTE — Progress Notes (Addendum)
Patient expressed to nursing staff that right axilla boil is increasing in tenderness and she would like it 'lanced". Writer contacted general surgery resident to request evaluation and treatment, resident will be coming this day to unit to see/treat patient.   3PM UPDATE:  Patient was evaluated by Dario GuardianAngela Barskaya, MD who discussed chronic nature patient's condition of Hydradenitis as being a chronic inflammatory medical condition where inflammation will reoccur even if I/D is done. Dr. Jefferson FuelBarskaya communicated to writer that she will consult with her attending and decide whether plastics service should handle patient's consult for this request.

## 2015-11-27 NOTE — Treatment Plan (Signed)
Inpatient Psychiatry Multi-Disciplinary Treatment Plan      Date of plan:  11/27/15    Treatment Plan Type: Initial    Hospital Problem List    Active Problems:    Cocaine abuse    Pregnancy with 10 completed weeks gestation    Strengths    Strengths (pick two): Future oriented, Supportive relationships    Problems/Goals/Objectives section    Problem area #1: Depression    Individualized measurable manifestations:  Pt presented to the hospital with worsening depression and thoughts of suicide.    Goal:     To decrease suicidal ideation and improve mood.    Objectives:  Pt will report improved mood and deny SI prior to discharge.              Criteria for discharge:     Reduction of symptoms of depression.   Decreased suicidal ideation and improved functioning.        Progress towards goal(s):  N/A - Initial plan        Anticipated date of achievement:  12/04/15     Problem area #2: Substance Abuse/Dependence    Individualized measurable manifestations:  Pt has been using crack cocaine,  the last time on 11/24/15.     Goal:    To discontinue use of substances.  To establish sobriety and improve coping strategies.    Objectives:  Jenna Maxinshley Diveley will verbalize, by discharge, the benefit of participating in their post discharge recovery treatment.   Jenna [name] will identify possible members of a sober support system by discharge.              Criteria for discharge:   Attainment of Objectives    Progress towards goal(s):  N/A - Initial plan            Anticipated date of achievement:  12/04/15    Patient Identified Goals  To feel better    Treatment Modalities-Interventions:    MD/NP/Resident:  medication evaluation and adjustment and psychoeducation around diagnosis and somatic treatments    Nursing staff:  maintain physiological and psychological safety (see nursing care plan for specific goals and objectives relevant to risk for suicide/self-harm and risk for violence including safety plan development), education around  coping skill development (see nursing care plan for specific goals and objectives relevant to coping skill development), education around the role medications have in symptom stabilization (see nursing care plan for specific goals and objectives related to medication treatment) and social support network involvement in care (may include family involvement, friends, etc.).  (See nursing care plan for specific goals and objectives related to support network involvement)    Social Worker:  discharge planning, family/social support system intervention, referral and linkage to resources, collateral contacts for continuity of care and psychosocial risk management    Activities Therapist: Provide opportunities for creative self-expression, Provide task oriented therapeutic activities to promote mindfulness & distraction skills, Provide physical opportunities to decrease stress and Provide psycho-educational opportunities to promote coping skills        Discharge Planning:    [facility]; explore housing options    Patient/Support System/Treatment Team Participating in Treatment Planning:    Patient participation in treatment planning: Yes    Patient Signature:  _________________________________  Date: _______________    Land O'LakesFamily/Support Network Participating in Investment banker, operationalTreatment Planning (if applicable):    Name:  ________________________________  Date:  ____________________    Name:  ________________________________  Date:  ____________________    Staff Participating in CounsellorDevelopment  of Treatment Plan:    Charna Elizabeth, MD                                                                                                 11/27/2015/12:57 PM   Printed Name/Credentials                             Signature                                       Date/Time            Quitman Livings, NP                                                                                                       11/27/2015/12:57 PM   Printed  Name/Credentials                                Signature                                       Date/Time      Rhett Bannister, LMSW                                                                                   11/27/2015/12:57 PM   Printed Name/Credentials                                Signature                                       Date/Time        Inez [name], RN  11/27/2015/12:57 PM   Printed Name/Credentials                                Signature                                       Date/Time                                                                                                                                       11/27/2015/12:57 PM   Printed Name/Credentials                                Signature                                        Date/Time                                                                                                                                        11/27/2015/12:57 PM   Printed Name/Credentials                                Signature                                        Date/Time

## 2015-11-27 NOTE — Plan of Care (Addendum)
Risk for Suicide     Patient Will Be Safe And Free From Injury Throughout Hospitalization Maintaining          Risk for Suicide     Patient Will Verbalize Understanding Of Role Of Medications In Stabilizing Symptoms By Day 3 Progressing towards goal     Patient Will Identify 2 Alternative Ways Of Dealing With Stress And Emotional Problems By Day 3 Progressing towards goal          Days 11/27/2015: Pt presents as cooperative and engages well on contacts. Denies SI/HI/AVH. Sequential thought process exhibited; pt able to answer assessment questions and make needs and wants known. Dysphoric mood with labile and irritable affect exhibited. Med compliant. PRN zofran administered for nausea with positive effect. PRN tylenol administered for pain in R armpit caused by cyst with positive effect. General surgery resident contacted per pt request; see note. Present in milieu with limited interactions this shift. Keeps requesting room change but writer informed pt that may not happen today. Makes needs and wants known. Report given to Inez CatalinaSarah Laudico, RN by Arville GoSarah Bubel, RN.    [phone] 11/27/2015: No significant changes from previous assessment. Denies SI/HI/AVH. Room change completed this part of shift, but pt upset she was moved to the other suite and not a private room. Became agitated and yelled at staff saying that she was no longer suicidal and wants to go home. Staff offered emotional support and encouraged her to speak with treatment team tomorrow. Pt's fiancee in to visit. Initially visit was tolerated poorly, as pt was saying she was upset with him for a comment he made to her. Then pt and fiancee observed in hallway hugging each other and pt told writer that they were fine and that they loved each other again. When fiancee left unit at end of visiting hours, he told staff he was done with her and hastily left unit. Makes needs and wants known. No scheduled meds this part of shift. No PRNs requested or utilized.  Surgical residents in to drain R armpit cyst; see note. Report given to Laney Potashatherine L., RN by Arville GoSarah Bubel, RN.     Mental Status Exam  Appearance: Disheveled  Relationship to Interviewer: Cooperative  Psychomotor Activity: Normal  Abnormal Movements: None  Muscle Strength and Tone: Normal  Station/Gait : Normal  Speech : Regular rate, Normal tone, Normal rhythm  Language: Normal comprehension  Mood: Dysphoric  Affect: Irritable, Labile  Thought Process: Sequential  Thought Content: No suicidal ideation, No homicidal ideation  Perceptions/Associations : No hallucinations  Sensorium: Alert  Cognition: Poor attention span  Fund of Knowledge: Normal  Insight : Poor  Judgement: Poor
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
## 2015-11-28 MED ORDER — QUETIAPINE FUMARATE 25 MG PO TABS *I*
25.0000 mg | ORAL_TABLET | Freq: Three times a day (TID) | ORAL | Status: DC | PRN
Start: 2015-11-28 — End: 2015-12-16
  Administered 2015-11-28 – 2015-12-15 (×11): 25 mg via ORAL
  Filled 2015-11-28 (×11): qty 1

## 2015-11-28 NOTE — Progress Notes (Signed)
Psychiatric Daily Progress Note    I saw and examined this patient in the presence of NP and SW today. I reviewed nurses' notes as well as all other notes, lab tests and reports made during the last 24 hours. I discussed the case with members of the treatment team.    HPI: (Comment on the development of the patient's present illness from the previous encounter) (Level 1 &2: 1 - 3 Elements, Level 3: 4 Elements)    Morrie Sheldonshley remains depressed and upset about faltering relationship; surgery saw patient to drain hydradenitis yesterday.; discussed using Seroquel if needed for anxiety    Review of Systems (Level 2: 1 System, Level 3:  2-9 Systems):    Review of Systems   All other systems reviewed and are negative.      Mental Status Exam - Psych. Exam (Level 2&3: 2-7 Elements of the MSE):    PSYCH EXAM:     Constitutional  Appearance:  Dressed appropriately  Vital Signs: BP 112/54 (BP Location: Right arm)   Pulse 80   Temp 36.3 C (97.3 F) (Temporal)    Resp 16   LMP 09/13/2015 (Within Weeks)   SpO2 99%    Musculoskeletal  Station/ Gait:  Normal  Muscle Strength:  Normal  Muscle Tone:  Normal    Psychiatric  Psychomotor Activity:  Normal  Abnormal Movements:  None  Relationship to Interviewer:  Cooperative  Speech:  Normal  Language:  Normal comprehension  Mood:  Dysphoric  Affect:  Dysphoric  Thought Process:  Ruminative  Thought Content:  No suicidal ideation and no homicidal ideation  Cognition:  Fair attention span  Perceptions/ Associations:  No hallucinations  Insight:  Poor  Judgement:  Poor  Sensorium:  Alert  Fund of Knowledge:  Normal         Physical Exam (if applicable):    Physical Exam   Constitutional: She appears well-developed and well-nourished.   Nursing note and vitals reviewed.      Scheduled Meds:   sertraline  50 mg Oral Daily    traZODone  50 mg Oral Nightly    fluconazole  150 mg Oral Once    chlorhexidine   Topical Daily    prenatal plus iron  1 tablet Oral Daily     Continuous Infusions:    PRN Meds:.QUEtiapine, acetaminophen, aluminum & magnesium hydroxide w/ simethicone, magnesium hydroxide, ondansetron, albuterol HFA         Reason(s) for continued stay: Unable to care for self in a less structured environment    Labs   All labs in the last 24 hours: No results found for this or any previous visit (from the past 24 hour(s)).    Diagnostic Impression  Active Problems:    Cocaine abuse    Pregnancy with 10 completed weeks gestation    MDD    Assessment/Plan:  Stabilize further  Add Seroquel 25mg  tid        Author: Charna ElizabethLISA Mercades Bajaj, MD  as of: 11/28/2015  at: 1:38 PM

## 2015-11-28 NOTE — Progress Notes (Signed)
Descriptive Sentence  Jenna [name] is a 30 y.o. female [redacted] weeks pregnant.with history significant for depression, prior SI/attempt, and substance use.admitted with worsening depression and SI.      Active Issues  Depression  Tearful  SI  Pregnant  Insomnia  Homeless  Cysts  Yeast infection  Multiple psychosocial stressors        To Do List  Add Seroquel 25 mg TID PRN for anxiety                Anticipatory Guidance  9.13  SP      Attending Handoff

## 2015-11-28 NOTE — Plan of Care (Addendum)
Risk for Suicide     Patient Will Be Safe And Free From Injury Throughout Hospitalization Maintaining          Risk for Suicide     Patient Will Verbalize Understanding Of Role Of Medications In Stabilizing Symptoms By Day 3 Progressing towards goal     Patient Will Identify 2 Alternative Ways Of Dealing With Stress And Emotional Problems By Day 3 Progressing towards goal        1630-2030  Pt. Active and visible in milieu. Pt.'s room moved. Pt. Reports being happy about this. Pt. Denies SI/HI/AVH. Pt.'s dressing changed.No s/s of infection noted. Mild drainage noted. Pt. Requested/received Zofran 4mg  po prn for reported nausea. Pt. Polite and pleasant on contacts.Pt. Had poor visit with significant other. Pt. Tearful. Took space in room and did not want to discuss with staff.  Able to make needs known. Renne CriglerJake Tiare Rohlman, RN  Report given to Santina Evansatherine D.,RN

## 2015-11-28 NOTE — Plan of Care (Signed)
Risk for Suicide     Patient Will Be Safe And Free From Injury Throughout Hospitalization Maintaining          Risk for Suicide     Patient Will Verbalize Understanding Of Role Of Medications In Stabilizing Symptoms By Day 3 Progressing towards goal     Patient Will Identify 2 Alternative Ways Of Dealing With Stress And Emotional Problems By Day 3 Progressing towards goal        Night 11/28/15  Patient awoke briefly, otherwise appeared to sleep throughout the night.
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
## 2015-11-28 NOTE — Plan of Care (Signed)
Risk for Suicide     Patient Will Be Safe And Free From Injury Throughout Hospitalization Progressing towards goal     Patient Will Verbalize Understanding Of Role Of Medications In Stabilizing Symptoms By Day 3 Progressing towards goal     Patient Will Identify 2 Alternative Ways Of Dealing With Stress And Emotional Problems By Day 3 Progressing towards goal        11/28/15       2030-0030             Pt was seclusive to room all evening sleeping. Took HS meds early. Able to make needs known

## 2015-11-28 NOTE — Plan of Care (Signed)
Risk for Suicide     Patient Will Be Safe And Free From Injury Throughout Hospitalization Maintaining          Risk for Suicide     Patient Will Verbalize Understanding Of Role Of Medications In Stabilizing Symptoms By Day 3 Progressing towards goal     Patient Will Identify 2 Alternative Ways Of Dealing With Stress And Emotional Problems By Day 3 Progressing towards goal        0830-1630  Pt. Active and visible in milieu. Pt. Spent much time napping in her room throughout the day. Pt. Did not voice or gesture acute safety concerns. Pt.'s significant other visited. Visit appeared to go well. Pt. Had mostly positive interactions with staff and peers. Pt. Medication compliant. Pt. utilized prnTylenol 650 mg for right arm pain related to cyst. Able to make needs known. Renne CriglerJake Damoni Erker, RN  Report given to Santina Evansatherine D.,RN

## 2015-11-29 NOTE — Consults (Addendum)
Medicine Consult to Psychiatry     Called received from: Alessandra BevelsVaughn RN  Reason for call:  R axillary pain  Patients subjective complaint:  30 yo female [redacted] week pregnant w/ PMH of depression and hydradenitis presents today w/ 10/10 R axillary pain with increase in size from 9/14. Pt previously complained of R-axillary pain and evaluated by acute surgery and aspirated. Acute care recommended Abx but does not appear pt received a course. Pt down with yellow drainage and pt concerned area is infected.      Pt denies: Fever , Chills, N/V, HA , CP , SOB, Weakness, dizziness     Objective data     Physical exam:     General: WD/WN in NAD  HEAD: NC/AT  Eyes: Non-Icteric   Heart : s1/s/2  Lungs: CTA bilaterally   Abdomen: Obese non-tender   R - Axillary: soft ~ 3x3cm area of fluctuance w/ areas of superficial erythema but no surrounding erythema around fluctuance.     Most recent vital sign results:  Wt Readings from Last 1 Encounters:   10/30/15 130.7 kg (288 lb 1.6 oz)     Temp Readings from Last 1 Encounters:   11/29/15 36.8 C (98.2 F) (Temporal)     BP Readings from Last 1 Encounters:   11/29/15 124/63     Pulse Readings from Last 1 Encounters:   11/29/15 81               Recent Laboratory Results          Lab results: 11/25/15  2014   WBC 10.3*   Hemoglobin 9.2*   Hematocrit 31*   RBC 3.9   Platelets 356                 Lab results: 11/25/15  2014   Sodium 138   Potassium 4.7   Chloride 100   CO2 27   UN 16   Creatinine 0.97*   GFR,Caucasian 79   GFR,Black 91   Glucose 105*   Calcium 9.8                  Lab results: 11/25/15  2014   TSH 0.58            Assessment & Plan     This is a 30 y.o. yo female, [facility] was called to address R- Axillary pain w/ concern of area of fluctuance.      Recommendations:   - Will discuss with Acute Care Surgery to see if area needs to be lanced.   - Check CBC   - Notify [facility] if febrile   - Continue tylenol     Follow-up       Please call [facility] if: any medical questions       [facility] pager  [phone]

## 2015-11-29 NOTE — Plan of Care (Signed)
Risk for Suicide     Patient Will Be Safe And Free From Injury Throughout Hospitalization Maintaining          Risk for Suicide     Patient Will Verbalize Understanding Of Role Of Medications In Stabilizing Symptoms By Day 3 Progressing towards goal     Patient Will Identify 2 Alternative Ways Of Dealing With Stress And Emotional Problems By Day 3 Progressing towards goal          1600-2030  Visiting with her boyfriend at the start of shift which appeared to go well. Later requested tylenol for 10/10 pain under her R armpit at the site of cyst. Prn Seroquel requested for anxiety. Afterwards voiced concern over the appearance of her cyst and stated "it's gotten bigger since they drained it, it just doesn't look right and I think it may be infected". When observed cyst appeared enlarged however this was Clinical research associatewriter first observation and unable to compare to previous appearance. Redness noted on cyst. Vitals taken and stable. [facility] paged and PA came to unit to assess. Pt was informed surgical team will be consulted to see if further drainage is required. Denies SI/HI/AVH. Report given to Barbarann Ehlersatherine D. RN

## 2015-11-29 NOTE — Plan of Care (Signed)
Risk for Suicide     Patient Will Be Safe And Free From Injury Throughout Hospitalization Maintaining          Risk for Suicide     Patient Will Verbalize Understanding Of Role Of Medications In Stabilizing Symptoms By Day 3 Progressing towards goal     Patient Will Identify 2 Alternative Ways Of Dealing With Stress And Emotional Problems By Day 3 Progressing towards goal        Pt dressed in personal clothing. Showered in am, used chlorhexadine scrib. Dressing changed. Presents as euthymic. Fiance to visit later today, states they are currently getting along. Seclusive to room, lying in bed throughout shift. In good behavioral control.

## 2015-11-29 NOTE — Plan of Care (Signed)
Risk for Suicide    • Patient Will Be Safe And Free From Injury Throughout Hospitalization Progressing towards goal    • Patient Will Verbalize Understanding Of Role Of Medications In Stabilizing Symptoms By Day 3 Progressing towards goal    • Patient Will Identify 2 Alternative Ways Of Dealing With Stress And Emotional Problems By Day 3 Progressing towards goal        Slept all night

## 2015-11-29 NOTE — Plan of Care (Signed)
Risk for Suicide     Patient Will Be Safe And Free From Injury Throughout Hospitalization Maintaining          Risk for Suicide     Patient Will Verbalize Understanding Of Role Of Medications In Stabilizing Symptoms By Day 3 Progressing towards goal     Patient Will Identify 2 Alternative Ways Of Dealing With Stress And Emotional Problems By Day 3 Progressing towards goal          Eves 2000-0030 Patient OOR visible in milieu coloring and interacting appropriately with peers. Accepted hs medication for sleep with positive effect. Maintaining safe behaviors. Offered no further c/o re cyst. Awaiting surgical team consult per [facility], Napoleon FormKelli Shakeda Pearse, RN

## 2015-11-30 LAB — CBC AND DIFFERENTIAL
Baso # K/uL: 0 10*3/uL (ref 0.0–0.1)
Basophil %: 0.4 %
Eos # K/uL: 0.1 10*3/uL (ref 0.0–0.4)
Eosinophil %: 1.1 %
Hematocrit: 30 % — ABNORMAL LOW (ref 34–45)
Hemoglobin: 9.1 g/dL — ABNORMAL LOW (ref 11.2–15.7)
IMM Granulocytes #: 0 10*3/uL (ref 0.0–0.1)
IMM Granulocytes: 0.3 %
Lymph # K/uL: 2.3 10*3/uL (ref 1.2–3.7)
Lymphocyte %: 21 %
MCH: 24 pg/cell — ABNORMAL LOW (ref 26–32)
MCHC: 30 g/dL — ABNORMAL LOW (ref 32–36)
MCV: 79 fL (ref 79–95)
Mono # K/uL: 0.7 10*3/uL (ref 0.2–0.9)
Monocyte %: 6.6 %
Neut # K/uL: 7.7 10*3/uL — ABNORMAL HIGH (ref 1.6–6.1)
Nucl RBC # K/uL: 0 10*3/uL (ref 0.0–0.0)
Nucl RBC %: 0 /100 WBC (ref 0.0–0.2)
Platelets: 340 10*3/uL (ref 160–370)
RBC: 3.8 MIL/uL — ABNORMAL LOW (ref 3.9–5.2)
RDW: 17.3 % — ABNORMAL HIGH (ref 11.7–14.4)
Seg Neut %: 70.6 %
WBC: 10.9 10*3/uL — ABNORMAL HIGH (ref 4.0–10.0)

## 2015-11-30 NOTE — Progress Notes (Addendum)
Brief progress note:    - Discussed pt w/ Acute care surgery advised they would call back.   - Discussed pt with Plastic Surgery and advised they do not manage acute abscesses but will follow-up with Dr. Alvester MorinBell as outpt for possible excision once abscess is stabilized.     Vitals:    11/29/15 1911   BP: 124/63   Pulse: 81   Resp:    Temp: 36.8 C (98.2 F)         Recommendation:   - Please draw CBC w/ Diff to trend WBC   - Notify [facility] if febrile, currently vital signs stable.   - Will await feedback from acute care surgery      Addendum:  Advised Acute care would see pt to evaluate, appreciate recommendation.

## 2015-11-30 NOTE — Plan of Care (Signed)
Risk for Suicide    • Patient Will Be Safe And Free From Injury Throughout Hospitalization Progressing towards goal    • Patient Will Verbalize Understanding Of Role Of Medications In Stabilizing Symptoms By Day 3 Progressing towards goal    • Patient Will Identify 2 Alternative Ways Of Dealing With Stress And Emotional Problems By Day 3 Progressing towards goal        Slept all night

## 2015-11-30 NOTE — Plan of Care (Signed)
Risk for Suicide     Patient Will Be Safe And Free From Injury Throughout Hospitalization Progressing towards goal     Patient Will Verbalize Understanding Of Role Of Medications In Stabilizing Symptoms By Day 3 Progressing towards goal     Patient Will Identify 2 Alternative Ways Of Dealing With Stress And Emotional Problems By Day 3 Progressing towards goal        11/30/15     2030-0030         Pt was pleasant and cooperative on contanct and appropriate in the milieu. Euthymic mood appropriate affect. Pt denies suicidal ideation. Pt took HS meds early and went to sleep.. No safety concerns this evening

## 2015-11-30 NOTE — Plan of Care (Addendum)
Risk for Suicide     Patient Will Be Safe And Free From Injury Throughout Hospitalization Maintaining          Risk for Suicide     Patient Will Verbalize Understanding Of Role Of Medications In Stabilizing Symptoms By Day 3 Progressing towards goal     Patient Will Identify 2 Alternative Ways Of Dealing With Stress And Emotional Problems By Day 3 Progressing towards goal          Pt presents with irritable mood and labile affect, ranging from cheerful and jovial, to irritable and angry, with fluctuations mostly coinciding with conversations with significant other on the phone, or visit in person. At the start of the shift pt stated, "he's why I'm here. I gotta tell him I can't do this, he makes me crazy." Writer offered emotional support, and attempted to brainstorm healthy relationship features with pt, but pt seemed to prefer to focus on the negatives of her current relationship. Denies SI/HI/AVH. Med compliant, and dressing changed. CBC collected and sent. Present in the milieu with mostly appropriate peer interactions. Report to C. Obie DredgeLangley, RN     Evenings 1600-2000: Affect more consistently euthymic throughout shift. Remainder of visit with significant other appeared to go well. No scheduled meds this shift. Requested and received prn tylenol for underarm pain with positive effect. Dressing replaced per pt request. Moderate amount of serous fluid noted on the previous dressing. Report to C. DeStefano, RN

## 2015-12-01 MED ORDER — ALBUTEROL SULFATE HFA 108 (90 BASE) MCG/ACT IN AERS *I*
2.0000 | INHALATION_SPRAY | Freq: Four times a day (QID) | RESPIRATORY_TRACT | Status: DC | PRN
Start: 2015-12-01 — End: 2015-12-16
  Administered 2015-12-01 – 2015-12-10 (×5): 2 via RESPIRATORY_TRACT
  Filled 2015-12-01: qty 18

## 2015-12-01 NOTE — Plan of Care (Signed)
Risk for Suicide     Patient Will Be Safe And Free From Injury Throughout Hospitalization Maintaining          Risk for Suicide     Patient Will Verbalize Understanding Of Role Of Medications In Stabilizing Symptoms By Day 3 Progressing towards goal     Patient Will Identify 2 Alternative Ways Of Dealing With Stress And Emotional Problems By Day 3 Progressing towards goal            Appropriately dressed. Euthymic mood and appropriate affect. Stated "I feel so happy. It's these meds I'm taking. I feel bubbly." Visited by fiance which she appeared to enjoy. Visible in milieu interacting well with peers and staff. Changed dressing twice on right underarm-moderate discharge noted, no foul odor. Complaints of 6/10 pain in that area, requested and received prn tylenol with moderate effect. Compliant with scheduled meds. Denies SI, no safety concerns at this time.

## 2015-12-01 NOTE — Plan of Care (Signed)
Risk for Suicide    • Patient Will Be Safe And Free From Injury Throughout Hospitalization Progressing towards goal    • Patient Will Verbalize Understanding Of Role Of Medications In Stabilizing Symptoms By Day 3 Progressing towards goal    • Patient Will Identify 2 Alternative Ways Of Dealing With Stress And Emotional Problems By Day 3 Progressing towards goal        Slept all night

## 2015-12-01 NOTE — Progress Notes (Signed)
Pt presented at [facility] c/o anxiety described as racing thoughts; PRN seroquel utilized. Pt c/o constipation; Milk of Mag utilized; awaiting results.

## 2015-12-01 NOTE — Plan of Care (Signed)
Risk for Suicide     Patient Will Be Safe And Free From Injury Throughout Hospitalization Maintaining          Risk for Suicide     Patient Will Verbalize Understanding Of Role Of Medications In Stabilizing Symptoms By Day 3 Progressing towards goal     Patient Will Identify 2 Alternative Ways Of Dealing With Stress And Emotional Problems By Day 3 Progressing towards goal        Pt presents as euthymic with appropriate affect. Visit with fiance appeared to go well. Continues to report arm pain, tylenol given with moderate effect. Requested seroquel for sleep with good effect. Overheard arguing on phone with fiance later in evening. Dressed appropriately, makes wants and needs known. In adequate behavioral control.

## 2015-12-01 NOTE — Progress Notes (Signed)
Notes and handoff reviewed and discussed with MD and SW.  Pt seen this morning and plan of care discussed with current attending physician Dr. Charna ElizabethLisa Slimmer and Rhett BannisterJennifer Castiglione, LMSW.Writer read MIPS consult from weekend. CBC reviewed  Descriptive Sentence  Jenna [name] is a 30 y.o. female [redacted] weeks pregnant.with history significant for depression, prior SI/attempt, and substance use.admitted with worsening depression and SI.      Active Issues  Depression  Tearful  SI  Pregnant  Insomnia  Homeless  Cysts  Yeast infection  Multiple psychosocial stressors  CBC essentially unchanged        To Do List  Read consult note: hydradenitis  Requires outpatient follow up for hydradenitis.                Anticipatory Guidance  9.13  SP      Attending Handoff

## 2015-12-01 NOTE — Progress Notes (Signed)
Psychiatric Daily Progress Note    I saw and examined this patient in the presence of NP and SW today. I reviewed nurses' notes as well as all other notes, lab tests and reports made during the last 24 hours. I discussed the case with members of the treatment team.    HPI: (Comment on the development of the patient's present illness from the previous encounter) (Level 1 &2: 1 - 3 Elements, Level 3: 4 Elements)    Jenna [name] is improving although still w emotional dysregulation and feels unsafe to leave if does not have safe housing    Review of Systems (Level 2: 1 System, Level 3:  2-9 Systems):    Review of Systems   All other systems reviewed and are negative.      Mental Status Exam - Psych. Exam (Level 2&3: 2-7 Elements of the MSE):    PSYCH EXAM:     Constitutional  Appearance:  Dressed appropriately  Vital Signs: BP 132/69 (BP Location: Right arm)   Pulse 84   Temp 36.5 C (97.7 F) (Temporal)    Resp 16   LMP 09/13/2015 (Within Weeks)   SpO2 99%    Musculoskeletal  Station/ Gait:  Normal  Muscle Strength:  Normal  Muscle Tone:  Normal    Psychiatric  Psychomotor Activity:  Normal  Abnormal Movements:  None  Relationship to Interviewer:  Cooperative  Speech:  Normal  Language:  Normal comprehension  Mood:  Dysphoric  Affect:  Dysphoric  Thought Process:  Ruminative  Thought Content:  No homicidal ideation and no suicidal ideation  Cognition:  Fair attention span  Perceptions/ Associations:  No hallucinations  Insight:  Poor  Judgement:  Poor  Sensorium:  Alert  Fund of Knowledge:  Normal         Physical Exam (if applicable):    Physical Exam   Constitutional: She appears well-developed and well-nourished.   Nursing note and vitals reviewed.      Scheduled Meds:   sertraline  50 mg Oral Daily    traZODone  50 mg Oral Nightly    chlorhexidine   Topical Daily    prenatal plus iron  1 tablet Oral Daily     Continuous Infusions:   PRN Meds:.QUEtiapine, acetaminophen, aluminum & magnesium hydroxide w/ simethicone,  magnesium hydroxide, albuterol HFA         Reason(s) for continued stay: Unable to care for self in a less structured environment    Labs   All labs in the last 24 hours:   Recent Results (from the past 24 hour(s))   CBC and differential    Collection Time: 11/30/15  3:44 PM   Result Value Ref Range    WBC 10.9 (H) 4.0 - 10.0 THOU/uL    RBC 3.8 (L) 3.9 - 5.2 MIL/uL    Hemoglobin 9.1 (L) 11.2 - 15.7 g/dL    Hematocrit 30 (L) 34 - 45 %    MCV 79 79 - 95 fL    MCH 24 (L) 26 - 32 pg/cell    MCHC 30 (L) 32 - 36 g/dL    RDW 16.117.3 (H) 09.611.7 - 14.4 %    Platelets 340 160 - 370 THOU/uL    Seg Neut % 70.6 %    Lymphocyte % 21.0 %    Monocyte % 6.6 %    Eosinophil % 1.1 %    Basophil % 0.4 %    Neut # K/uL 7.7 (H) 1.6 - 6.1 THOU/uL  Lymph # K/uL 2.3 1.2 - 3.7 THOU/uL    Mono # K/uL 0.7 0.2 - 0.9 THOU/uL    Eos # K/uL 0.1 0.0 - 0.4 THOU/uL    Baso # K/uL 0.0 0.0 - 0.1 THOU/uL    Nucl RBC % 0.0 0.0 - 0.2 /100 WBC    Nucl RBC # K/uL 0.0 0.0 - 0.0 THOU/uL    IMM Granulocytes # 0.0 0.0 - 0.1 THOU/uL    IMM Granulocytes 0.3 %       Diagnostic Impression  Active Problems:    Cocaine abuse    Pregnancy with 10 completed weeks gestation        Assessment/Plan:  Stabilize further  Need safe housing/ ALR referral in place      Author: Charna Elizabeth, MD  as of: 12/01/2015  at: 12:52 PM

## 2015-12-02 MED ORDER — QUETIAPINE FUMARATE 25 MG PO TABS *I*
25.0000 mg | ORAL_TABLET | Freq: Three times a day (TID) | ORAL | Status: DC
Start: 2015-12-02 — End: 2015-12-16
  Administered 2015-12-02 – 2015-12-16 (×42): 25 mg via ORAL
  Filled 2015-12-02 (×42): qty 1

## 2015-12-02 NOTE — Progress Notes (Signed)
Psych Social Work Progress Note  Chart reviewed and Discussed patient with treatment team    Contact with: Patient, Family/Spouse/Partner/Informal support and Inpatient team/staff (MD,NP, Nurse, Psych Tech)    Contact type: Rounding and Face-to-Face    Purpose of Contact: Continue assessment, Referrals and Discharge planning/Continuity of care planning    Psychosocial Risk Factors Risk Factors: Yes, Homeless: Yes, Homeless comments: Pt does not have stable housing and has been sanctioned. , Suspected Substance Abuse: Yes - Addictive Behavior Screen must be completed, Suspected substance abuse comments: Pt was using substances, Lacks Income or payment mechanism: Yes, Lacks income or payment mechanism comments: Pt does not income, Risk of Violence to Self: Yes, Risk of violence to self comments: pt has suicidal thoughts, Hx of Psychological Trauma: Yes, Hx of psychological trauma comments: Pt lost an infant to SIDS    Narrative: Met with the patient during rounds with the treatment team. At times she feel's she is going "crazy" because of the mood swings she has been experiencing lately. The importance of skills and medications were discussed, and also to factor in that she is currently pregnant. Patient understood and seemed relieved to hear this information. She has been using coloring as a coping skill and hung up the pictures in her room. She is waiting for ALR.     Received an update on the waitlist from [facility], pt is next on the list but there is no bed this week.    Met with the patient and informed her of ALR waitlist. Pt's fiance was also present.     Next Steps: Wait for ALR bed. It is noted that a CM referral was completed in CPEP, writer will look into this.  [name], LMSW   Pager: [phone]

## 2015-12-02 NOTE — Continuity of Care (Signed)
Alternate Living Residence Referral    Name: Jenna [name]  Address:  [address]  [place] [id number]  [place]  Ethnic Group: African American  Date of Birth: 09/29/1985  Age: 30 y.o.  Gender: female  Religion: None  Veteran: No    Anticipated Discharge Date: 12/04/15    Reason for Referral: Pt is currently homeless.     Housing Plan after 21 days: Pt has an apartment available on October 1st.     Insurance & Policy #: Lockie MolaMEDICAID,    MCD,    [id number]  SELF PAY,    S          Employment: no  Finances: [employer]    Emergency Contact:  Name:Winston Oceanographerpence  Phone Number:[phone]  Relationship: spouse    Psychiatrist:  )     Nurse Practitioner:       [facility]    Case Manager:   referral being submitted during her admission.     Chemical Dependency: Sander RadonHuther Doyle     Follow-Up Appointment or Program: [x] Outpatient MH  [x] Outpatient CD  [] PROS   [] APHP  [] Other:     PCP: Amador Cunasichard, Eric, MD  Phone: [phone]    Legal Issue(s) (if yes, specify): No  Probation/Parole:    AOT? No (If yes, have patient sign release of information for AOT and fax to their office at # [phone])    PPD Placed: yes  PPD Results: not done    Special Needs (if yes, specify): No  Allergies: No Known Allergies (drug, envir, food or latex)  Special Dietary Needs (if yes, specify): No    Psychiatric Diagnosis: Major Depression Disorder, Recurrent & Cocaine Abuse      Please fax referral to Katie at [facility] Living Residence (ALR) at [phone]

## 2015-12-02 NOTE — Progress Notes (Signed)
Notes and handoff reviewed and discussed with MD and SW.  Pt seen this morning and plan of care discussed with current attending physician Dr. Charna ElizabethLisa Slimmer and Rhett BannisterJennifer Castiglione, LMSW.  Descriptive Sentence  Jenna [name] is a 30 y.o. female [redacted] weeks pregnant.with history significant for depression, prior SI/attempt, and substance use.admitted with worsening depression and SI.      Active Issues  Depression  Tearful  Pregnant  Insomnia  Homeless  Cysts  Multiple psychosocial stressors  Mood swings        To Do List  Change Seroquel to 25 mg TID  Provide radio and coloring materials              Anticipatory Guidance  9.13  D/C SP      Attending Handoff

## 2015-12-02 NOTE — Plan of Care (Signed)
Risk for Suicide     Patient Will Be Safe And Free From Injury Throughout Hospitalization Maintaining          Risk for Suicide     Patient Will Verbalize Understanding Of Role Of Medications In Stabilizing Symptoms By Day 3 Progressing towards goal     Patient Will Identify 2 Alternative Ways Of Dealing With Stress And Emotional Problems By Day 3 Progressing towards goal          Friendly on contacts. Euthymic mood and affect. Boyfriend visited, continues to ask staff to warm food brought onto unit  hospital policies/ pt education provided to pt and visitor.  C/o 5/10 pain rt arm, administered PRN Tylenol with good effect. Denies SI, HI, AV/H, and safety concerns. Med compliant.  By Selinda Flaviniana Dannetta Lekas, RN

## 2015-12-02 NOTE — Continuity of Care (Signed)
Scheduled Meds:   QUEtiapine  25 mg Oral TID    sertraline  50 mg Oral Daily    traZODone  50 mg Oral Nightly    chlorhexidine   Topical Daily    prenatal plus iron  1 tablet Oral Daily     Continuous Infusions:   PRN Meds:.albuterol HFA, QUEtiapine, acetaminophen, aluminum & magnesium hydroxide w/ simethicone, magnesium hydroxide    Medication list for ALR.     Lyndle Herrlicholleen Olga Seyler, BSW Social Work Assistant [phone]

## 2015-12-02 NOTE — Plan of Care (Signed)
Risk for Suicide     Patient Will Be Safe And Free From Injury Throughout Hospitalization Maintaining          Risk for Suicide     Patient Will Verbalize Understanding Of Role Of Medications In Stabilizing Symptoms By Day 3 Progressing towards goal     Patient Will Identify 2 Alternative Ways Of Dealing With Stress And Emotional Problems By Day 3 Progressing towards goal        Night 12/02/15  Patient came to the nurse's station at approximately 0300 requesting graham crackers.  Given.  Patient returned to her room.  Patient appeared to fall back asleep at approximately 0330.

## 2015-12-02 NOTE — Progress Notes (Signed)
Psychiatric Daily Progress Note    I saw and examined this patient in the presence of NP and SW today. I reviewed nurses' notes as well as all other notes, lab tests and reports made during the last 24 hours. I discussed the case with members of the treatment team.    HPI: (Comment on the development of the patient's present illness from the previous encounter) (Level 1 &2: 1 - 3 Elements, Level 3: 4 Elements)    Morrie Sheldonshley feels very unstable/ labile in mood/ irritability; discussed meds and skills to treat this; denies SI    Review of Systems (Level 2: 1 System, Level 3:  2-9 Systems):    Review of Systems   All other systems reviewed and are negative.      Mental Status Exam - Psych. Exam (Level 2&3: 2-7 Elements of the MSE):    PSYCH EXAM:     Constitutional  Appearance:  Dressed appropriately  Vital Signs: BP 134/59 (BP Location: Right arm)   Pulse 96   Temp 36.8 C (98.2 F) (Temporal)    Resp 16   LMP 09/13/2015 (Within Weeks)   SpO2 99%    Musculoskeletal  Station/ Gait:  Normal  Muscle Strength:  Normal  Muscle Tone:  Normal    Psychiatric  Psychomotor Activity:  Normal  Abnormal Movements:  None  Relationship to Interviewer:  Cooperative  Speech:  Normal  Language:  Normal comprehension  Mood:  Dysphoric and irritable  Affect:  Dysphoric and labile  Thought Process:  Linear/ goal directed  Thought Content:  No homicidal ideation and no suicidal ideation  Cognition:  Fair attention span  Perceptions/ Associations:  No hallucinations  Insight:  Poor  Judgement:  Poor  Sensorium:  Alert  Fund of Knowledge:  Normal         Physical Exam (if applicable):    Physical Exam   Constitutional: She appears well-developed and well-nourished.   Nursing note and vitals reviewed.      Scheduled Meds:   sertraline  50 mg Oral Daily    traZODone  50 mg Oral Nightly    chlorhexidine   Topical Daily    prenatal plus iron  1 tablet Oral Daily     Continuous Infusions:   PRN Meds:.albuterol HFA, QUEtiapine, acetaminophen,  aluminum & magnesium hydroxide w/ simethicone, magnesium hydroxide         Reason(s) for continued stay: Unable to care for self in a less structured environment    Labs   All labs in the last 24 hours: No results found for this or any previous visit (from the past 24 hour(s)).    Diagnostic Impression  Active Problems:    Cocaine abuse    Pregnancy with 10 completed weeks gestation    MDD/ Borderline Pers D/O    Assessment/Plan:  Stabilize further  Add Seroquel 25mg  tid and titrate up  DBT skills reinforced  Safe housing needed      Author: Charna ElizabethLISA Zadiel Leyh, MD  as of: 12/02/2015  at: 9:38 AM

## 2015-12-02 NOTE — Plan of Care (Signed)
Risk for Suicide     Patient Will Be Safe And Free From Injury Throughout Hospitalization Maintaining          Risk for Suicide     Patient Will Verbalize Understanding Of Role Of Medications In Stabilizing Symptoms By Day 3 Progressing towards goal     Patient Will Identify 2 Alternative Ways Of Dealing With Stress And Emotional Problems By Day 3 Progressing towards goal        Patient is pleasant, cooperative, and friendly on contact. She has been present in the milieu throughout the shift laughing, and joking around with peers, and her boyfriend. Her boyfriend was in to visit for the entire shift and the two are very comfortable on the unit, the boyfriend requested to do his laundry, has been having staff heat up his food, etc. Currently she denies SI/HI/AVH. She is medication compliant, and has been using seroquel throughout the shift, with no notable effect, as patient does not appear to be in distress when requesting medication and continues to appear to not be in distress after receiving medication.

## 2015-12-03 NOTE — Progress Notes (Addendum)
Psych Social Work Progress Note  Chart reviewed and Discussed patient with treatment team    Contact with: Patient, Engineer, drilling and Family/Spouse/Partner/Informal support    Contact type: Geophysicist/field seismologist, Counsellor and Face-to-Face    Purpose of Contact: Continue assessment, Referrals, Obtain consents and Discharge planning/Continuity of care planning    Psychosocial Risk Factors Risk Factors: Yes, Homeless: Yes, Homeless comments: Pt does not have stable housing and has been sanctioned. , Suspected Substance Abuse: Yes - Addictive Behavior Screen must be completed, Suspected substance abuse comments: Pt was using substances, Lacks Income or payment mechanism: Yes, Lacks income or payment mechanism comments: Pt does not income, Risk of Violence to Self: Yes, Risk of violence to self comments: pt has suicidal thoughts, Hx of Psychological Trauma: Yes, Hx of psychological trauma comments: Pt lost an infant to SIDS    Narrative: Writer called Jenna [name] from [name] to follow-up on the care manager referral completed in CPEP. The referral was picked up by Jenna [name] on 6/73. Jenna [name] provided the supervisor Jenna [name] number to follow up with [phone]). Following this, writer left a voicemail for Jenna [name] requesting a call back to discuss this referral and arrange a time for someone to come to the unit.     Met with the patient and her fiance, Jenna [name]. They were informed she has been assigned with [name] for care management which the patient was excited to hear. Follow up was discussed and chemical dependency treatment, she brought up [name] where she has been in the past. Writer also suggested [name] as appropriate follow-up for her. The pt's fiance found an apartment for them but now it is not ready until November 1st. They both brought up a Jenna [name], museum named Jenna [name] who was helpful to them, they would like Jenna [name] to [name] and discuss her being sanctioned  and her care. Writer informed them her name would be found in the chart and she would be called.     Writer spoke with Jenna [name]. She knew the patient from her last pregnancy and will complete a baby love referral for the patient right now. Jenna [name] recommended looking into [name] for residential care to help with pt's cocaine use. Jenna [name] is in charge of intake at [name] and would be a contact for there. The program is six months long. The patient's sanction was discussed, it will not be lifted until the patient is in CD treatment. Jenna [name] recommends the patient follow's up at [name].     Discussed with patient the conversation with Jenna [name]. The patient is agreeable to following up with [facility] [name] versus [name]. She was informed she must participate in CD treatment if she wants her sanction lifted; pt is agreeable. Her cocaine use was discussed and [name]; pt declined and feel's she does not need that treatment. She reports her drug use has been sporadic and she feel's like she is able to take care of herself and her unborn child. In the past pt attended [name] but only stayed for four days.     Next Steps: Wait to hear back from Jenna [name].  Jenna [name], [name]   Pager: [phone]

## 2015-12-03 NOTE — Plan of Care (Signed)
Risk for Suicide     Patient Will Be Safe And Free From Injury Throughout Hospitalization Maintaining          Risk for Suicide     Patient Will Verbalize Understanding Of Role Of Medications In Stabilizing Symptoms By Day 3 Progressing towards goal     Patient Will Identify 2 Alternative Ways Of Dealing With Stress And Emotional Problems By Day 3 Progressing towards goal              Appropriately dressed. Euthymic mood with low frustration tolerance. Visited by fiance and son for most of shift. Perseverated on issues with other patients, namely KG. Pt has been half-halled d/t verbal altercations with peer KG. Visible in milieu interacting well with select peers. Polite and cooperative on contacts. Compliant with scheduled meds, requested and received prn tylenol for 8/10 pain in right underarm. Denies SI, no safety concerns at this time.

## 2015-12-03 NOTE — Plan of Care (Signed)
Risk for Suicide     Patient Will Be Safe And Free From Injury Throughout Hospitalization Maintaining          Risk for Suicide     Patient Will Verbalize Understanding Of Role Of Medications In Stabilizing Symptoms By Day 3 Progressing towards goal     Patient Will Identify 2 Alternative Ways Of Dealing With Stress And Emotional Problems By Day 3 Progressing towards goal        Night 12/03/15  Patient appeared to sleep throughout the night.

## 2015-12-03 NOTE — Progress Notes (Signed)
Psychiatric Daily Progress Note    I saw and examined this patient in the presence of NP and SW today. I reviewed nurses' notes as well as all other notes, lab tests and reports made during the last 24 hours. I discussed the case with members of the treatment team.    HPI: (Comment on the development of the patient's present illness from the previous encounter) (Level 1 &2: 1 - 3 Elements, Level 3: 4 Elements)    Discussed ways to manage emotions and stress here and onward; great diffficulty with coping skills; labile mood at times; denies SI    Review of Systems (Level 2: 1 System, Level 3:  2-9 Systems):    Review of Systems   All other systems reviewed and are negative.      Mental Status Exam - Psych. Exam (Level 2&3: 2-7 Elements of the MSE):    PSYCH EXAM:     Constitutional  Appearance:  Dressed appropriately  Vital Signs: BP 122/58 (BP Location: Left arm)   Pulse 97   Temp 36.5 C (97.7 F) (Temporal)    Resp 16   LMP 09/13/2015 (Within Weeks)   SpO2 99%    Musculoskeletal  Station/ Gait:  Normal  Muscle Strength:  Normal  Muscle Tone:  Normal    Psychiatric  Psychomotor Activity:  Normal  Abnormal Movements:  None  Relationship to Interviewer:  Cooperative  Speech:  Normal  Language:  Normal comprehension  Mood:  Dysphoric  Affect:  Labile  Thought Process:  Ruminative  Thought Content:  No suicidal ideation and no homicidal ideation  Cognition:  Fair attention span  Perceptions/ Associations:  No hallucinations  Insight:  Poor  Judgement:  Poor  Sensorium:  Alert  Fund of Knowledge:  Normal         Physical Exam (if applicable):    Physical Exam   Nursing note and vitals reviewed.      Scheduled Meds:   QUEtiapine  25 mg Oral TID    sertraline  50 mg Oral Daily    traZODone  50 mg Oral Nightly    chlorhexidine   Topical Daily    prenatal plus iron  1 tablet Oral Daily     Continuous Infusions:   PRN Meds:.albuterol HFA, QUEtiapine, acetaminophen, aluminum & magnesium hydroxide w/ simethicone,  magnesium hydroxide         Reason(s) for continued stay: Unable to care for self in a less structured environment    Labs   All labs in the last 24 hours: No results found for this or any previous visit (from the past 24 hour(s)).    Diagnostic Impression  Active Problems:    Cocaine abuse    Pregnancy with 10 completed weeks gestation        Assessment/Plan:  Stabilize further  Awaits ALR for safe housing      Author: Charna ElizabethLISA Katlyne Nishida, MD  as of: 12/03/2015  at: 11:35 AM

## 2015-12-03 NOTE — Progress Notes (Signed)
Notes and handoff reviewed and discussed with MD and SW.  Pt seen this morning and plan of care discussed with current attending physician Dr. Charna ElizabethLisa Slimmer.  Descriptive Sentence  Jenna [name] is a 30 y.o. female [redacted] weeks pregnant.with history significant for depression, prior SI/attempt, and substance use.admitted with worsening depression and SI.      Active Issues  Depression  Tearful  Pregnant  Insomnia  Homeless  Cysts  Multiple psychosocial stressors  Mood swings        To Do List  No medication changes  No additional medical consults needed for hydradenitis, continue drsg changes and tylenol for pain  PRN. Patient is to follow up on an outpatient basis with Dr. Alvester MorinBell.               Anticipatory Guidance  9.13        Attending Handoff

## 2015-12-03 NOTE — Progress Notes (Addendum)
Brief Progress note:    - Following up on possible cyst/abcess on R-axilla.   - Discussed with Acute care surgery over weekend that they would see pt. Was advised by RN over weekend that pt was seen by surgical team but no note in chart. Unable to verify with pt if pt was seen again by ACS as pt sleeping and pt RN advising best not to wake pt Hydrographic surveyor(writer in agreement). Will need to verify with pt tomorrow if pt was seen and if so plan that was discussed , and if ACS did not see pt, would re -consult if no improvement.   - Please touch base with MIPS in AM regarding plan for Cyst/abcess in R-axilla as this is something we are tracking.

## 2015-12-03 NOTE — Plan of Care (Signed)
Risk for Suicide     Patient Will Be Safe And Free From Injury Throughout Hospitalization Maintaining          Risk for Suicide     Patient Will Verbalize Understanding Of Role Of Medications In Stabilizing Symptoms By Day 3 Progressing towards goal     Patient Will Identify 2 Alternative Ways Of Dealing With Stress And Emotional Problems By Day 3 Progressing towards goal        Pt dressed appropriately, visible in milieu. Became dysregulated at start of shift, stated peer KG was targeting her. Able to re-regulate with staff support. Encouraged pt to ignore peer and alert staff if there is a problem. Fiance her to visit. Pt requesting follow up from surgery regarding cyst on arm; per team, pt to follow up outpt with Dr Alvester MorinBell. Writer explained this to pt who voiced understanding. Makes wants and needs known. Utilized seroquel prn for anxiety and tylenol for headache with positive effect. In adequate behavioral control. Denies SI.

## 2015-12-04 MED ORDER — ONDANSETRON 4 MG PO TBDP *I*
4.0000 mg | ORAL_TABLET | Freq: Two times a day (BID) | ORAL | Status: DC | PRN
Start: 2015-12-04 — End: 2015-12-16
  Administered 2015-12-07 – 2015-12-11 (×4): 4 mg via ORAL
  Filled 2015-12-04 (×4): qty 1

## 2015-12-04 NOTE — Plan of Care (Signed)
Risk for Suicide     Patient Will Be Safe And Free From Injury Throughout Hospitalization Maintaining          Risk for Suicide     Patient Will Verbalize Understanding Of Role Of Medications In Stabilizing Symptoms By Day 3 Progressing towards goal     Patient Will Identify 2 Alternative Ways Of Dealing With Stress And Emotional Problems By Day 3 Progressing towards goal        Night 12/04/15  Patient appeared to sleep throughout the night.

## 2015-12-04 NOTE — Plan of Care (Signed)
Risk for Suicide     Patient Will Be Safe And Free From Injury Throughout Hospitalization Maintaining          Risk for Suicide     Patient Will Verbalize Understanding Of Role Of Medications In Stabilizing Symptoms By Day 3 Progressing towards goal     Patient Will Identify 2 Alternative Ways Of Dealing With Stress And Emotional Problems By Day 3 Progressing towards goal          Euthymic mood and affect. Visible in  milieu interacting well with select others. Denies SI/HI/AV/H  and safety concerns. Boyfriend visited, tolerated well. [facility], encouraged to discuss with team in the morning. Later in shift pt began yelling in alcove at peer T.C. and was asked by staff to take space in her room. Pt later apologized to Clinical research associatewriter. By Selinda Flaviniana Robynn Marcel, RN

## 2015-12-04 NOTE — Progress Notes (Signed)
Notes and handoff reviewed and discussed with MD and SW.  Pt seen this morning and plan of care discussed with current attending physician Dr. Charna ElizabethLisa Slimmer and Rhett BannisterJennifer Castiglione, LMSW.   Descriptive Sentence   Jenna [name] is a 30 y.o. female [redacted] weeks pregnant.with history significant for depression, prior SI/attempt, and substance use.admitted with worsening depression and SI.      Active Issues  Depression  Tearful  Pregnant (11 weeks)  Insomnia  Homeless  Cysts  Multiple psychosocial stressors  Mood swings  Nausea         To Do List  Add Zofran 4 mg every 12 hours PRN for nausea  Nutrition consult ordered              Anticipatory Guidance  9.13        Attending Handoff

## 2015-12-04 NOTE — Progress Notes (Signed)
Patient is 11w 5 day pregnant complaining of nausea. Writer consulted with Pharmacist Margretta SidleJane Sundberg who recommended Zofran 4mg  BID as category B. Writer ordered this medication on a limited BID PRN basis (1 wk duration), along with a nutritional consult for dietary interventions for pregnancy associated nausea.

## 2015-12-04 NOTE — Plan of Care (Signed)
Risk for Suicide     Patient Will Be Safe And Free From Injury Throughout Hospitalization Maintaining          Risk for Suicide     Patient Will Verbalize Understanding Of Role Of Medications In Stabilizing Symptoms By Day 3 Progressing towards goal     Patient Will Identify 2 Alternative Ways Of Dealing With Stress And Emotional Problems By Day 3 Progressing towards goal          Pt exhibiting euthymic mood and appropriate affect. Cooperative and pleasant on contacts. Remains loud with a low frustration tolerance, and was overheard arguing with her fiance on the phone. Pt denies SI/HI/AVH and safety concerns. Said, "I'm a little anxious because of my bad phone call," but declined intervention. Pt accepted all scheduled meds. Requested a PRN for nausea, but by the time writer spoke with pt's NP and got PRN Zofran ordered, she said the nausea had passed. Pt cooperative with half-hall restriction. Pt's fiance in to visit and she tolerated this well. Makes needs known.     Mental Status Exam  Appearance: Appropriately dressed  Relationship to Interviewer: Cooperative  Psychomotor Activity: Normal  Abnormal Movements: None  Muscle Strength and Tone: Normal  Station/Gait : Normal  Speech : Regular rate, Loud  Language: Normal comprehension  Mood: Euthymic  Affect: Appropriate  Thought Process: Sequential, Goal-directed  Thought Content: No suicidal ideation, No homicidal ideation  Perceptions/Associations : No hallucinations  Sensorium: Alert  Cognition: Fair attention span  Fund of Knowledge: Normal  Insight : Poor  Judgement: Poor

## 2015-12-04 NOTE — Progress Notes (Signed)
Psychiatric Daily Progress Note    I saw and examined this patient in the presence of NP and SW today. I reviewed nurses' notes as well as all other notes, lab tests and reports made during the last 24 hours. I discussed the case with members of the treatment team.    HPI: (Comment on the development of the patient's present illness from the previous encounter) (Level 1 &2: 1 - 3 Elements, Level 3: 4 Elements)    Still w emotional dysregulation but improving; not safe for homeless shelter    Review of Systems (Level 2: 1 System, Level 3:  2-9 Systems):    Review of Systems   All other systems reviewed and are negative.      Mental Status Exam - Psych. Exam (Level 2&3: 2-7 Elements of the MSE):    PSYCH EXAM:     Constitutional  Appearance:  Dressed appropriately  Vital Signs: BP 121/71 (BP Location: Right arm)   Pulse 85   Temp 36.4 C (97.5 F) (Temporal)    Resp 16   LMP 09/13/2015 (Within Weeks)   SpO2 100%    Musculoskeletal  Station/ Gait:  Normal  Muscle Strength:  Normal  Muscle Tone:  Normal    Psychiatric  Psychomotor Activity:  Normal  Abnormal Movements:  None  Relationship to Interviewer:  Cooperative  Speech:  Normal  Language:  Normal comprehension  Mood:  Euthymic  Affect:  Labile  Thought Process:  Ruminative  Thought Content:  No suicidal ideation and no homicidal ideation  Cognition:  Fair attention span  Perceptions/ Associations:  No hallucinations  Insight:  Poor  Judgement:  Poor  Sensorium:  Alert  Fund of Knowledge:  Normal         Physical Exam (if applicable):    Physical Exam   Constitutional: She appears well-developed and well-nourished.   Nursing note and vitals reviewed.      Scheduled Meds:   QUEtiapine  25 mg Oral TID    sertraline  50 mg Oral Daily    traZODone  50 mg Oral Nightly    chlorhexidine   Topical Daily    prenatal plus iron  1 tablet Oral Daily     Continuous Infusions:   PRN Meds:.ondansetron, albuterol HFA, QUEtiapine, acetaminophen, aluminum & magnesium hydroxide  w/ simethicone, magnesium hydroxide         Reason(s) for continued stay: Stabilization of gains and Need for safe discharge    Labs   All labs in the last 24 hours: No results found for this or any previous visit (from the past 24 hour(s)).    Diagnostic Impression  Active Problems:    Cocaine abuse    Pregnancy with 10 completed weeks gestation        Assessment/Plan:  Stabilize further  Needs safe housing and we await this at [facility]/ she is not appopriate fro homeless shelter      Author: Charna ElizabethLISA Doria Fern, MD  as of: 12/04/2015  at: 11:25 AM

## 2015-12-04 NOTE — Progress Notes (Addendum)
Psych Social Work Progress Note  Chart reviewed and Discussed patient with treatment team    Contact with: Patient, Engineer, drilling and Inpatient team/staff (MD,NP, Nurse, Psych Tech)    Contact type: Rounding, Telephone Contact and [facility] secure E-Mail    Purpose of Contact: Continue assessment, Referrals and Discharge planning/Continuity of care planning    Psychosocial Risk Factors Risk Factors: Yes, Homeless: Yes, Homeless comments: Pt does not have stable housing and has been sanctioned. , Suspected Substance Abuse: Yes - Addictive Behavior Screen must be completed, Suspected substance abuse comments: Pt was using substances, Lacks Income or payment mechanism: Yes, Lacks income or payment mechanism comments: Pt does not income, Risk of Violence to Self: Yes, Risk of violence to self comments: pt has suicidal thoughts, Hx of Psychological Trauma: Yes, Hx of psychological trauma comments: Pt lost an infant to SIDS    Narrative: Met with the patient during rounds with the treatment team. Patient discussed incidents with two other peers on the unit but reportedly is glad she is on the half hall restriction. She continues to color pictures as a coping skill while on the unit.    Received a call from outreach care Insurance underwriter at [facility] ([phone]). She will come to the unit on Monday at 11:30am to enroll pt in services.     [facility] were emailed requesting an intake and review to see if patient would be appropriate for [facility].     Next Steps: Meet with CM on Monday at 11:30. Obtain [facility] appointment. Continue to wait for [facility] bed.  [name], LMSW   Pager: [phone]

## 2015-12-04 NOTE — Consults (Signed)
Medical Nutrition Therapy Brief Note:    Writer completed diet education on morning sickness with pt this PM. Pt was appeared to be receptive of information provided. Writer went over information in depth. Pt asked appropriate questions. Provided pt with Morning Sickness Nutrition Therapy handouts and left with pt.     Pt c/o epigastric pain. Writer encouraged pt to decrease her consumption of high fat and high acidic foods. Writer stressed the importance of taking care of herself nutritionally now that she is pregnant. Encouraged pt to increase her fluid intake.    Nutrition will continue to follow per low nutrition risk protocol. Will follow up on diet ed provided today.    Antonietta BarcelonaMegan Kelly, DTR  Pager [phone]

## 2015-12-05 MED ORDER — TUBERCULIN PPD 5 UNIT/0.1ML ID SOLN *I*
5.0000 [IU] | Freq: Once | INTRADERMAL | Status: AC
Start: 2015-12-05 — End: 2015-12-05
  Administered 2015-12-05: 5 [IU] via INTRADERMAL

## 2015-12-05 MED ORDER — SERTRALINE HCL 100 MG PO TABS *I*
100.0000 mg | ORAL_TABLET | Freq: Every day | ORAL | Status: DC
Start: 2015-12-06 — End: 2015-12-16
  Administered 2015-12-06 – 2015-12-16 (×11): 100 mg via ORAL
  Filled 2015-12-05 (×11): qty 1

## 2015-12-05 NOTE — Progress Notes (Signed)
Notes and handoff reviewed and discussed with MD and SW.  Pt seen this morning and plan of care discussed with current attending physician Dr. Charna ElizabethLisa Slimmer and Rhett BannisterJennifer Castiglione, LMSW.  Descriptive Sentence  Jenna [name] is a 30 y.o. female [redacted] weeks pregnant.with history significant for depression, prior SI/attempt, and substance use.admitted with worsening depression and SI.      Active Issues  Depression  Tearful  Pregnant  Insomnia  Homeless  Cysts  Multiple psychosocial stressors  Mood swings  Nausea   Asthma        To Do List  Patient requesting Spiriva, will look at PTA medication   Increase Zoloft to 100 mg QD              Anticipatory Guidance  9.13  SP

## 2015-12-05 NOTE — Plan of Care (Signed)
Risk for Suicide     Patient Will Be Safe And Free From Injury Throughout Hospitalization Maintaining          Risk for Suicide     Patient Will Verbalize Understanding Of Role Of Medications In Stabilizing Symptoms By Day 3 Progressing towards goal     Patient Will Identify 2 Alternative Ways Of Dealing With Stress And Emotional Problems By Day 3 Progressing towards goal        Night 12/05/15  Patient appeared to sleep throughout the night.

## 2015-12-05 NOTE — Plan of Care (Signed)
Risk for Suicide     Patient Will Be Safe And Free From Injury Throughout Hospitalization Maintaining     Patient Will Verbalize Understanding Of Role Of Medications In Stabilizing Symptoms By Day 3 Maintaining     Patient Will Identify 2 Alternative Ways Of Dealing With Stress And Emotional Problems By Day 3 Maintaining            Evening 12/05/15.   Pt was cooperative on contact.  Denies SI/VAH.  Denies any other safety issues however, remains with low frustration tolerance if needs/wants not met right away.  Visible in the milieu watching TV. Complied with meds.

## 2015-12-05 NOTE — Plan of Care (Signed)
Risk for Suicide     Patient Will Be Safe And Free From Injury Throughout Hospitalization Maintaining          Risk for Suicide     Patient Will Verbalize Understanding Of Role Of Medications In Stabilizing Symptoms By Day 3 Progressing towards goal     Patient Will Identify 2 Alternative Ways Of Dealing With Stress And Emotional Problems By Day 3 Progressing towards goal        Pt visible in milieu, watching tv in alcove. Few interactions with peers observed, pt exhibits low frustration tolerance.  Accepted scheduled meds, utilized prn tylenol for headache with good effect. Visit from fiance appeared to go well. Denies SI. Dressed appropriately, in adequate behavioral control.

## 2015-12-05 NOTE — Progress Notes (Signed)
Psychiatric Daily Progress Note    I saw and examined this patient in the presence of NP and SW today. I reviewed nurses' notes as well as all other notes, lab tests and reports made during the last 24 hours. I discussed the case with members of the treatment team.    HPI: (Comment on the development of the patient's present illness from the previous encounter) (Level 1 &2: 1 - 3 Elements, Level 3: 4 Elements)    Emotional dysregulation continues as does anxiety/ poor coping skills    Review of Systems (Level 2: 1 System, Level 3:  2-9 Systems):    Review of Systems   All other systems reviewed and are negative.      Mental Status Exam - Psych. Exam (Level 2&3: 2-7 Elements of the MSE):    PSYCH EXAM:     Constitutional  Appearance:  Dressed appropriately  Vital Signs: BP 145/65 (BP Location: Right arm)   Pulse 86   Temp 36.5 C (97.7 F) (Temporal)    Resp 16   LMP 09/13/2015 (Within Weeks)   SpO2 100%    Musculoskeletal  Station/ Gait:  Normal  Muscle Strength:  Normal  Muscle Tone:  Normal    Psychiatric  Psychomotor Activity:  Normal  Abnormal Movements:  None  Relationship to Interviewer:  Cooperative  Speech:  Normal  Language:  Normal comprehension  Mood:  Dysphoric  Affect:  Dysphoric  Thought Process:  Ruminative  Thought Content:  No suicidal ideation and no homicidal ideation  Cognition:  Fair attention span  Perceptions/ Associations:  No hallucinations  Insight:  Poor  Judgement:  Poor  Sensorium:  Alert  Fund of Knowledge:  Normal         Physical Exam (if applicable):    Physical Exam   Constitutional: She appears well-developed and well-nourished.   Nursing note and vitals reviewed.      Scheduled Meds:   [START ON 12/06/2015] sertraline  100 mg Oral Daily    QUEtiapine  25 mg Oral TID    traZODone  50 mg Oral Nightly    chlorhexidine   Topical Daily    prenatal plus iron  1 tablet Oral Daily     Continuous Infusions:   PRN Meds:.ondansetron, albuterol HFA, QUEtiapine, acetaminophen, aluminum &  magnesium hydroxide w/ simethicone, magnesium hydroxide         Reason(s) for continued stay: Stabilization of gains and Need for safe discharge    Labs   All labs in the last 24 hours: No results found for this or any previous visit (from the past 24 hour(s)).    Diagnostic Impression  Active Problems:    Cocaine abuse    Pregnancy with 10 completed weeks gestation  Borderline PErs D/O/ MDD      Assessment/Plan:  Stabilize further  Increase Zoloft to 100mg  a day  DBT  [place]      Author: Charna ElizabethLISA Dustyn Dansereau, MD  as of: 12/05/2015  at: 11:49 AM

## 2015-12-05 NOTE — Progress Notes (Signed)
In to see pt to discuss her request to start Spiriva. Pt reports she has been on Spiriva in the past which "helps with my breathing" She is unable to clarify when she was prescribed Spiriva (although notes it was not within the past 6 months) or who prescribed it for her. She reports she was on "at least 3 inhalers before" She denies any current SOB or breathing difficulties. However, "when I walk up and down stairs the baby steals my breaths"     This Clinical research associatewriter spoke with [facility]ist [facility] [phone]) who clarifies that pt has never been prescribed Spiriva through their pharmacy. He reports pts PCP (Dr. Amador CunasEric Richard) did prescribed Symbicort, but this was last prescribed in 03/2015 and pt never picked this up.    Discussed with pt that we will hold off on restarting Spiriva and or Symbicort at this time. Will continue to have Albuterol inhaler available PRN. Plan to d/w MIPS if pt develops significant SOB or breathing difficulties. Pt agreeable with plan.

## 2015-12-06 NOTE — Plan of Care (Signed)
Risk for Suicide     Patient Will Be Safe And Free From Injury Throughout Hospitalization Maintaining          Risk for Suicide     Patient Will Verbalize Understanding Of Role Of Medications In Stabilizing Symptoms By Day 3 Progressing towards goal     Patient Will Identify 2 Alternative Ways Of Dealing With Stress And Emotional Problems By Day 3 Progressing towards goal              1600-2030 No change in assessment from previous shift. Remained seclusive to room except to get dinner and make needs known. Euthymic mood and affect. Visited by fiance which she appeared to enjoy. Compliant with scheduled seroquel, no prn meds requested or needed. Denies SI.

## 2015-12-06 NOTE — Plan of Care (Signed)
Risk for Suicide     Patient Will Be Safe And Free From Injury Throughout Hospitalization Maintaining     Patient Will Verbalize Understanding Of Role Of Medications In Stabilizing Symptoms By Day 3 Maintaining          Risk for Suicide     Patient Will Identify 2 Alternative Ways Of Dealing With Stress And Emotional Problems By Day 3 Progressing towards goal            Eves 2000-0030 Patient making phone calls and was overheard to be loud and demanding.  She calmed independently and returned to room.  Accepted support and meds, denied any physical concerns, maintaining safety, Napoleon FormKelli Garrie Elenes, RN

## 2015-12-06 NOTE — Plan of Care (Signed)
Risk for Suicide    • Patient Will Be Safe And Free From Injury Throughout Hospitalization Progressing towards goal    • Patient Will Verbalize Understanding Of Role Of Medications In Stabilizing Symptoms By Day 3 Progressing towards goal    • Patient Will Identify 2 Alternative Ways Of Dealing With Stress And Emotional Problems By Day 3 Progressing towards goal        Slept all night

## 2015-12-06 NOTE — Plan of Care (Signed)
Risk for Suicide     Patient Will Be Safe And Free From Injury Throughout Hospitalization Maintaining          Risk for Suicide     Patient Will Verbalize Understanding Of Role Of Medications In Stabilizing Symptoms By Day 3 Progressing towards goal     Patient Will Identify 2 Alternative Ways Of Dealing With Stress And Emotional Problems By Day 3 Progressing towards goal              Appropriately dressed and groomed. Euthymic mood and affect. Remained seclusive to room except for meals and to make needs known. Polite and friendly on contacts. Visited by fiance which she tolerated well. Compliant with 1/2 hall and scheduled meds, prn inhaler requested and given with positive effect. Denies SI.

## 2015-12-07 NOTE — Plan of Care (Signed)
Risk for Suicide     Patient Will Be Safe And Free From Injury Throughout Hospitalization Maintaining     Patient Will Verbalize Understanding Of Role Of Medications In Stabilizing Symptoms By Day 3 Maintaining     Patient Will Identify 2 Alternative Ways Of Dealing With Stress And Emotional Problems By Day 3 Maintaining        Eves 2000-0030 Patient went to bed early denies all. Napoleon FormKelli Aulton Routt, RN

## 2015-12-07 NOTE — Plan of Care (Signed)
Risk for Suicide     Patient Will Be Safe And Free From Injury Throughout Hospitalization Maintaining     Patient Will Verbalize Understanding Of Role Of Medications In Stabilizing Symptoms By Day 3 Maintaining     Patient Will Identify 2 Alternative Ways Of Dealing With Stress And Emotional Problems By Day 3 Maintaining          Cooperative and med compliant.  Dysphoric mood and affect, brightens on contacts. Visible in  milieu interacting well with others. Denies SI, HI, AV/H and safety concerns. C/o malaise and nausea, given PRN Zofran with good effect. Requested and given PRN Albuterol with good effect. Boyfriend visited, tolerated well. By Selinda Flaviniana Semira Stoltzfus, RN

## 2015-12-07 NOTE — Plan of Care (Signed)
Risk for Suicide     Patient Will Be Safe And Free From Injury Throughout Hospitalization Maintaining          Risk for Suicide     Patient Will Verbalize Understanding Of Role Of Medications In Stabilizing Symptoms By Day 3 Progressing towards goal     Patient Will Identify 2 Alternative Ways Of Dealing With Stress And Emotional Problems By Day 3 Progressing towards goal          Pt presents with depressed mood and labile affect ranging from tearful to cheerful on contacts. Present mostly in room. Had a visit from fiancee which did not appear to be going well. Writer checked on pt, and pt denied SI/HI/AVH stating, "no I'm just a little depressed." Writer asked what was wrong, and pt asked writer to confirm where she has spent most of her time lately. Writer stated that pt had mostly been in her room. Pt became tearful and loudly proclaimed to boyfriend, "see I stay in here, I'm not out there interacting with nobody. I'm in here by myself. I told you." Boyfriend left, and pt quickly calmed, and was shortly thereafter seen cheerfully requesting that he bring her food. Med compliant and no prns requested or needed. Report to C. DeStefano, RN

## 2015-12-07 NOTE — Plan of Care (Signed)
Risk for Suicide     Patient Will Be Safe And Free From Injury Throughout Hospitalization Maintaining     Patient Will Verbalize Understanding Of Role Of Medications In Stabilizing Symptoms By Day 3 Maintaining     Patient Will Identify 2 Alternative Ways Of Dealing With Stress And Emotional Problems By Day 3 Maintaining          Appeared to sleep all night.  By D. Linkon Siverson, RN

## 2015-12-08 NOTE — Progress Notes (Signed)
Psychiatric Daily Progress Note    I saw and examined this patient in the presence of NP and SW today. I reviewed nurses' notes as well as all other notes, lab tests and reports made during the last 24 hours. I discussed the case with members of the treatment team.    HPI: (Comment on the development of the patient's present illness from the previous encounter) (Level 1 &2: 1 - 3 Elements, Level 3: 4 Elements)    Jenna [name] remains dysphoric and ruminative; remains awaiting [facility] for safe housing    Review of Systems (Level 2: 1 System, Level 3:  2-9 Systems):    Review of Systems   All other systems reviewed and are negative.      Mental Status Exam - Psych. Exam (Level 2&3: 2-7 Elements of the MSE):    PSYCH EXAM:     Constitutional  Appearance:  Dressed appropriately  Vital Signs: BP 115/75 (BP Location: Right arm)   Pulse 79   Temp 37 C (98.6 F) (Temporal)    Resp 16   LMP 09/13/2015 (Within Weeks)   SpO2 100%    Musculoskeletal  Station/ Gait:  Normal  Muscle Strength:  Normal  Muscle Tone:  Normal    Psychiatric  Psychomotor Activity:  Normal  Abnormal Movements:  None  Relationship to Interviewer:  Engages well  Speech:  Normal  Language:  Normal comprehension  Mood:  Dysphoric  Affect:  Dysphoric  Thought Process:  Ruminative  Thought Content:  No suicidal ideation and no homicidal ideation  Cognition:  Fair attention span  Perceptions/ Associations:  No hallucinations  Insight:  Poor  Judgement:  Poor  Sensorium:  Alert  Fund of Knowledge:  Normal         Physical Exam (if applicable):    Physical Exam   Constitutional: She appears well-developed and well-nourished.   Nursing note and vitals reviewed.      Scheduled Meds:   sertraline  100 mg Oral Daily    QUEtiapine  25 mg Oral TID    traZODone  50 mg Oral Nightly    chlorhexidine   Topical Daily    prenatal plus iron  1 tablet Oral Daily     Continuous Infusions:   PRN Meds:.ondansetron, albuterol HFA, QUEtiapine, acetaminophen, aluminum & magnesium  hydroxide w/ simethicone, magnesium hydroxide         Reason(s) for continued stay: Unable to care for self in a less structured environment    Labs   All labs in the last 24 hours: No results found for this or any previous visit (from the past 24 hour(s)).    Diagnostic Impression  Active Problems:    Cocaine abuse    Pregnancy with 10 completed weeks gestation    MDD/ Borderline PErs D/O    Assessment/Plan:  Stabilize further  Await [facility]      Author: Charna ElizabethLISA Pamala Hayman, MD  as of: 12/08/2015  at: 12:09 PM

## 2015-12-08 NOTE — Plan of Care (Signed)
Risk for Suicide     Patient Will Be Safe And Free From Injury Throughout Hospitalization Maintaining     Patient Will Verbalize Understanding Of Role Of Medications In Stabilizing Symptoms By Day 3 Maintaining     Patient Will Identify 2 Alternative Ways Of Dealing With Stress And Emotional Problems By Day 3 Maintaining          Eve 12/08/2015 20:30 - 00:30: Pt denied all safety concerns and pain. Med compliant. PRN seroquel utilized for pt report of "feeling anxious" with positive effect. Euthymic mood with appropriate affect. Sequential thought process: able to answer assessment questions and express concerns. Present in the milieu interacting well with others. No behavioral issues this shift. Report given to Barbarann Ehlersatherine D., RN by Doristine Johnsan Shahara Hartsfield, RN.     Mental Status Exam  Appearance: Appropriately dressed  Relationship to Interviewer: Cooperative, Friendly  Psychomotor Activity: Normal  Abnormal Movements: None  Muscle Strength and Tone: Normal  Station/Gait : Normal  Speech : Regular rate  Language: Normal comprehension  Mood: Euthymic  Affect: Appropriate  Thought Process: Sequential  Thought Content: No suicidal ideation, No homicidal ideation  Perceptions/Associations : No hallucinations  Sensorium: Alert  Cognition: Fair attention span  Fund of Knowledge: Normal  Insight : Poor  Judgement: Poor

## 2015-12-08 NOTE — Consults (Signed)
Medical Nutrition Therapy Brief Note:    Met with pt this PM following up on diet ed pt received last week. Pt has made very small changes to her diet. Boyfriend brings in high fat/ greasy foods and pt continues ordering most of the same foods. Pt c/o nausea and emesis x2 today post lunch. C/o epigastric pain. Writer reminded pt what types of foods can be contributing to how pt stated she is feeling. Pt stated she wants to make changes to her diet, but seems hesitant.     No further follow up needed. Please re-consult as needed.      Valeria Batman, DTR  Pager [phone]

## 2015-12-08 NOTE — Progress Notes (Signed)
Notes and handoff reviewed and discussed with MD and SW.  Pt seen this morning and plan of care discussed with current attending physician Dr. Charna ElizabethLisa Slimmer and Rhett BannisterJennifer Castiglione, LMSW.  Descriptive Sentence  Jenna [name] is a 30 y.o. female [redacted] weeks pregnant.with history significant for depression, prior SI/attempt, and substance use.admitted with worsening depression and SI.      Active Issues  Depression  Tearful  Pregnant  Insomnia  Homeless  Cysts  Multiple psychosocial stressors  Mood swings  Nausea   Asthma        To Do List  No medication changes today              Anticipatory Guidance  9.13  SP        Attending Handoff

## 2015-12-08 NOTE — Progress Notes (Signed)
Psych Social Work Progress Note  Chart reviewed and Discussed patient with treatment team    Contact with: Patient, Engineer, drilling, Family/Spouse/Partner/Informal support and Inpatient team/staff (MD,NP, Nurse, Psych Tech)    Contact type: Rounding, [place] secure E-Mail and Face-to-Face    Purpose of Contact: Continue assessment, Referrals and Discharge planning/Continuity of care planning    Psychosocial Risk Factors Risk Factors: Yes, Homeless: Yes, Homeless comments: Pt does not have stable housing and has been sanctioned. , Suspected Substance Abuse: Yes - Addictive Behavior Screen must be completed, Suspected substance abuse comments: Pt was using substances, Lacks Income or payment mechanism: Yes, Lacks income or payment mechanism comments: Pt does not income, Risk of Violence to Self: Yes, Risk of violence to self comments: pt has suicidal thoughts, Hx of Psychological Trauma: Yes, Hx of psychological trauma comments: Pt lost an infant to SIDS    Narrative: Met with the patient during rounds with the treatment team. She remains waiting for a bed at [facility]. She inquired about OBGYN coming to the unit so she can see her baby, Probation officer explained she can call OBGYN and make an appointment once she leaves the hospital; she is agreeable to this. Pt was reminded a care manager will come and see her today.    Arbie Cookey from [employer] came to unit to enroll patient in care management services. She was provided clinical on the patient and told of the various referrals that have been placed. Pt's fiance was also present on the unit and joined in for this conversation.     Next Steps: Wait for ALR bed.  [name], LMSW   Pager: [phone]

## 2015-12-08 NOTE — Plan of Care (Addendum)
Risk for Suicide     Patient Will Be Safe And Free From Injury Throughout Hospitalization Maintaining     Patient Will Verbalize Understanding Of Role Of Medications In Stabilizing Symptoms By Day 3 Maintaining     Patient Will Identify 2 Alternative Ways Of Dealing With Stress And Emotional Problems By Day 3 Maintaining        Day 12/08/15: Pt periodically visible in milieu interacting well with peers. Boyfriend into visit this morning and visit appeared to go well. Denies current SI/HI/AVH; looking forward to discharge- states she is just waiting for ALR bed.   Fredrich RomansKatlin J Alyissa Whidbee, RN  12/08/2015  3:58 PM  Evening 12/08/15: 4pm-8:30pm Visible in milieu to make needs known and use phone. Utilized PRN Zofran at beginning of shift with apparent positive effect as patient was able to eat shrimp fried rice and other food that was brought in by her request by her boyfriend. Denies SI/HI/AVH; maintaining behavioral control.   Fredrich RomansKatlin J Janeann Paisley, RN  12/08/2015  8:30 PM

## 2015-12-09 NOTE — Progress Notes (Signed)
Notes and handoff reviewed and discussed with MD and SW.  Pt seen this morning and plan of care discussed with current attending physician Dr. Charna ElizabethLisa [name] and Jenna [name], LMSW.  Descriptive Sentence  Jenna [name] is a 30 y.o. female [redacted] weeks pregnant.with history significant for depression, prior SI/attempt, and substance use.admitted with worsening depression and SI.      Active Issues  Depression  Tearful  Pregnant  Insomnia  Homeless  Cysts  Multiple psychosocial stressors  Mood swings  Nausea   Asthma        To Do List  No medication changes today  Schedule F/U OB appointment upon discharge  Discharge possibly later this week              Anticipatory Guidance  9.13  SP        Attending Handoff

## 2015-12-09 NOTE — Progress Notes (Signed)
Psychiatric Daily Progress Note    I saw and examined this patient in the presence of NP and SW today. I reviewed nurses' notes as well as all other notes, lab tests and reports made during the last 24 hours. I discussed the case with members of the treatment team.    HPI: (Comment on the development of the patient's present illness from the previous encounter) (Level 1 &2: 1 - 3 Elements, Level 3: 4 Elements)    Morrie Sheldonshley is stabilizing but still needs a safe place to live to prevent decompensation    Review of Systems (Level 2: 1 System, Level 3:  2-9 Systems):    Review of Systems   All other systems reviewed and are negative.      Mental Status Exam - Psych. Exam (Level 2&3: 2-7 Elements of the MSE):    PSYCH EXAM:     Constitutional  Appearance:  Dressed appropriately  Vital Signs: BP 115/75 (BP Location: Right arm)   Pulse 79   Temp 37 C (98.6 F) (Temporal)    Resp 16   LMP 09/13/2015 (Within Weeks)   SpO2 100%    Musculoskeletal  Station/ Gait:  Normal  Muscle Strength:  Normal  Muscle Tone:  Normal    Psychiatric  Psychomotor Activity:  Normal  Abnormal Movements:  None  Relationship to Interviewer:  Cooperative  Speech:  Normal  Language:  Normal comprehension  Mood:  Dysphoric  Affect:  Dysphoric  Thought Process:  Ruminative  Thought Content:  No suicidal ideation and no homicidal ideation  Cognition:  Fair attention span  Perceptions/ Associations:  No hallucinations  Insight:  Poor  Judgement:  Poor  Sensorium:  Alert  Fund of Knowledge:  Normal         Physical Exam (if applicable):    Physical Exam   Constitutional: She appears well-developed and well-nourished.   Nursing note and vitals reviewed.      Scheduled Meds:   sertraline  100 mg Oral Daily    QUEtiapine  25 mg Oral TID    traZODone  50 mg Oral Nightly    chlorhexidine   Topical Daily    prenatal plus iron  1 tablet Oral Daily     Continuous Infusions:   PRN Meds:.ondansetron, albuterol HFA, QUEtiapine, acetaminophen, aluminum & magnesium  hydroxide w/ simethicone, magnesium hydroxide         Reason(s) for continued stay: Stabilization of gains and Need for safe discharge    Labs   All labs in the last 24 hours: No results found for this or any previous visit (from the past 24 hour(s)).    Diagnostic Impression  Active Problems:    Cocaine abuse    Pregnancy with 10 completed weeks gestation    MDD/ Borderline PErs D/O    Assessment/Plan:  Stabilize further  Await ALR opening        Author: Charna ElizabethLISA Tammy Wickliffe, MD  as of: 12/09/2015  at: 11:33 AM

## 2015-12-09 NOTE — Plan of Care (Signed)
Risk for Suicide     Patient Will Be Safe And Free From Injury Throughout Hospitalization Maintaining          Risk for Suicide     Patient Will Verbalize Understanding Of Role Of Medications In Stabilizing Symptoms By Day 3 Progressing towards goal     Patient Will Identify 2 Alternative Ways Of Dealing With Stress And Emotional Problems By Day 3 Progressing towards goal        Patient is pleasant, and cooperative on contact. She has been sitting in the milieu for nearly the entire day watching TV with peers. Her boyfriend was in to visit for the majority of the shift and then into evening. Currently she denies SI/HI/AVH. She is medication complaint.     Evening 1600-2030: Patient has not changed from previous shift. Her boyfriend remained here until visiting hours were over. When this writer went to pass this patients 1700 seroquel, patients boyfriend walked with this Clinical research associatewriter down to patients room, on the way he asked this Clinical research associatewriter if it was "seroquel time" when this Clinical research associatewriter responded yes he said "yippee I love this time of night". Patient did not appear to cheek. Currently patient denies SI/HI/AVH. She is medication compliant.

## 2015-12-09 NOTE — Progress Notes (Signed)
2030-0030 12/09/2015: No significant changes from previous assessment. Denies SI/HI/AVH. Med compliant. No PRNs requested or utilized. Makes needs and wants known. Report given to Doristine Johnsan Horn, RN by Arville GoSarah Bubel, RN.

## 2015-12-09 NOTE — Plan of Care (Signed)
Risk for Suicide     Patient Will Be Safe And Free From Injury Throughout Hospitalization Maintaining     Patient Will Verbalize Understanding Of Role Of Medications In Stabilizing Symptoms By Day 3 Maintaining     Patient Will Identify 2 Alternative Ways Of Dealing With Stress And Emotional Problems By Day 3 Maintaining        Night 12/09/15  Patient appeared to sleep throughout the night.

## 2015-12-10 MED ORDER — TRAZODONE HCL 100 MG PO TABS *I*
100.0000 mg | ORAL_TABLET | Freq: Every evening | ORAL | Status: DC
Start: 2015-12-10 — End: 2015-12-16
  Administered 2015-12-10 – 2015-12-15 (×6): 100 mg via ORAL
  Filled 2015-12-10 (×6): qty 1

## 2015-12-10 NOTE — Plan of Care (Signed)
Risk for Suicide     Patient Will Be Safe And Free From Injury Throughout Hospitalization Maintaining          Risk for Suicide     Patient Will Verbalize Understanding Of Role Of Medications In Stabilizing Symptoms By Day 3 Progressing towards goal     Patient Will Identify 2 Alternative Ways Of Dealing With Stress And Emotional Problems By Day 3 Progressing towards goal        0830-1230  Pt. Active and visible in milieu. Pt. Bright and polite on contacts. Pt. Denies any safety concerns. Pt. Reports shortness of breath. Pt. Requested/received Albuterol inhaler prn.  Pt. Often observed in alcove socializing with peers. Pt. Medication compliant. Able to make needs and wants known. Renne CriglerJake Khiem Gargis, RN  Report given to Mellody DancePhil Rockwell,RN

## 2015-12-10 NOTE — Progress Notes (Signed)
Notes and handoff reviewed and discussed with MD and SW.  Pt seen this morning and plan of care discussed with current attending physician Dr. Charna ElizabethLisa Slimmer and Rhett BannisterJennifer Castiglione, LMSW.   Descriptive Sentence  Jenna [name] is a 30 y.o. female [redacted] weeks pregnant.with history significant for depression, prior SI/attempt, and substance use.admitted with worsening depression and SI.      Active Issues  Depression  Tearful  Pregnant  Insomnia  Homeless  Cysts  Multiple psychosocial stressors  Mood swings  Nausea   Asthma  Poor sleep        To Do List  Increase Trazodone to 100 mg at  [facility]  Schedule F/U OB appointment upon discharge  Discharge possibly later this week              Anticipatory Guidance  9.13  SP

## 2015-12-10 NOTE — Progress Notes (Signed)
Psychiatric Daily Progress Note    I saw and examined this patient in the presence of NP and SW today. I reviewed nurses' notes as well as all other notes, lab tests and reports made during the last 24 hours. I discussed the case with members of the treatment team.    HPI: (Comment on the development of the patient's present illness from the previous encounter) (Level 1 &2: 1 - 3 Elements, Level 3: 4 Elements)    Patient is improving daily in mood stability and use of coping skills; no SI; insomnia; minor headache    Review of Systems (Level 2: 1 System, Level 3:  2-9 Systems):    Review of Systems   All other systems reviewed and are negative.      Mental Status Exam - Psych. Exam (Level 2&3: 2-7 Elements of the MSE):    PSYCH EXAM:     Constitutional  Appearance:  Dressed appropriately  Vital Signs: BP 101/53 (BP Location: Left arm)   Pulse 72   Temp 36.5 C (97.7 F) (Temporal)    Resp 16   LMP 09/13/2015 (Within Weeks)   SpO2 100%    Musculoskeletal  Station/ Gait:  Normal  Muscle Strength:  Normal  Muscle Tone:  Normal    Psychiatric  Psychomotor Activity:  Normal  Abnormal Movements:  None  Relationship to Interviewer:  Cooperative  Speech:  Normal  Language:  Normal comprehension  Mood:  Dysphoric  Affect:  Dysphoric  Thought Process:  Linear/ goal directed  Thought Content:  No suicidal ideation and no homicidal ideation  Cognition:  Fair attention span  Perceptions/ Associations:  No hallucinations  Insight:  Poor and improving  Judgement:  Poor  Sensorium:  Alert  Fund of Knowledge:  Normal         Physical Exam (if applicable):    Physical Exam   Constitutional: She appears well-developed and well-nourished.   Nursing note and vitals reviewed.      Scheduled Meds:   traZODone  100 mg Oral Nightly    sertraline  100 mg Oral Daily    QUEtiapine  25 mg Oral TID    chlorhexidine   Topical Daily    prenatal plus iron  1 tablet Oral Daily     Continuous Infusions:   PRN Meds:.ondansetron, albuterol HFA,  QUEtiapine, acetaminophen, aluminum & magnesium hydroxide w/ simethicone, magnesium hydroxide         Reason(s) for continued stay: Unable to care for self in a less structured environment    Labs   All labs in the last 24 hours: No results found for this or any previous visit (from the past 24 hour(s)).    Diagnostic Impression  Active Problems:    Cocaine abuse    Pregnancy with 10 completed weeks gestation    MDD/ Borderline PErs D/O    Assessment/Plan:  Increase Trazodone to 100mg  hs  Awaits ALR  Stabilize further        Author: Charna ElizabethLISA Acire Tang, MD  as of: 12/10/2015  at: 11:45 AM

## 2015-12-10 NOTE — Plan of Care (Signed)
Risk for Suicide     Patient Will Be Safe And Free From Injury Throughout Hospitalization Maintaining     Patient Will Verbalize Understanding Of Role Of Medications In Stabilizing Symptoms By Day 3 Maintaining     Patient Will Identify 2 Alternative Ways Of Dealing With Stress And Emotional Problems By Day 3 Maintaining            Day 12/10/15 1230 - 1600.  Pt reported feeling "well" and was out in the miliue interacting well with others.   Denies SI/VAH.  Denies any other safety issues.

## 2015-12-10 NOTE — Plan of Care (Signed)
Risk for Suicide     Patient Will Be Safe And Free From Injury Throughout Hospitalization Maintaining          Risk for Suicide     Patient Will Verbalize Understanding Of Role Of Medications In Stabilizing Symptoms By Day 3 Progressing towards goal     Patient Will Identify 2 Alternative Ways Of Dealing With Stress And Emotional Problems By Day 3 Progressing towards goal        Night 12/10/15  Patient appeared to sleep throughout the night.

## 2015-12-11 ENCOUNTER — Telehealth: Payer: Self-pay | Admitting: Psychiatry

## 2015-12-11 ENCOUNTER — Telehealth: Payer: Self-pay

## 2015-12-11 DIAGNOSIS — F32A Depression, unspecified: Secondary | ICD-10-CM | POA: Diagnosis present

## 2015-12-11 MED ORDER — ACETAMINOPHEN 325 MG PO TABS *I*
650.0000 mg | ORAL_TABLET | Freq: Four times a day (QID) | ORAL | 0 refills | Status: DC | PRN
Start: 2015-12-11 — End: 2016-01-24

## 2015-12-11 MED ORDER — TRAZODONE HCL 100 MG PO TABS *I*
100.0000 mg | ORAL_TABLET | Freq: Every evening | ORAL | 0 refills | Status: DC
Start: 2015-12-11 — End: 2016-02-12

## 2015-12-11 MED ORDER — QUETIAPINE FUMARATE 25 MG PO TABS *I*
25.0000 mg | ORAL_TABLET | Freq: Three times a day (TID) | ORAL | 0 refills | Status: DC
Start: 2015-12-11 — End: 2016-02-12

## 2015-12-11 MED ORDER — CHLORHEXIDINE GLUCONATE 4 % EX LIQD *WRAPPED*
Freq: Every day | CUTANEOUS | 0 refills | Status: DC
Start: 2015-12-11 — End: 2016-02-12

## 2015-12-11 MED ORDER — ALBUTEROL SULFATE HFA 108 (90 BASE) MCG/ACT IN AERS *I*
1.0000 | INHALATION_SPRAY | Freq: Four times a day (QID) | RESPIRATORY_TRACT | Status: DC | PRN
Start: 2015-12-11 — End: 2016-03-25

## 2015-12-11 MED ORDER — SERTRALINE HCL 100 MG PO TABS *I*
100.0000 mg | ORAL_TABLET | Freq: Every day | ORAL | 0 refills | Status: DC
Start: 2015-12-11 — End: 2016-02-12

## 2015-12-11 MED ORDER — PRENATAL PLUS IRON 29-1 MG PO TABS *I*
1.0000 | ORAL_TABLET | Freq: Every day | ORAL | 0 refills | Status: DC
Start: 2015-12-11 — End: 2016-07-08

## 2015-12-11 NOTE — Progress Notes (Addendum)
Psych Social Work Progress Note  Chart reviewed and Discussed patient with treatment team    Contact with: Patient, Engineer, drilling and Inpatient team/staff (MD,NP, Nurse, Psych Tech)    Contact type: Rounding, Telephone Contact and [facility] secure E-Mail    Purpose of Contact: Continue assessment, Referrals and Discharge planning/Continuity of care planning    Psychosocial Risk Factors Risk Factors: Yes, Homeless: Yes, Homeless comments: Pt does not have stable housing and has been sanctioned. , Suspected Substance Abuse: Yes - Addictive Behavior Screen must be completed, Suspected substance abuse comments: Pt was using substances, Lacks Income or payment mechanism: Yes, Lacks income or payment mechanism comments: Pt does not income, Risk of Violence to Self: Yes, Risk of violence to self comments: pt has suicidal thoughts, Hx of Psychological Trauma: Yes, Hx of psychological trauma comments: Pt lost an infant to SIDS    Narrative: Met with the patient during rounds with the treatment team. She has had difficulty sleeping but has been "managing" well with this. She is looking forward to her discharge to ALR.    [facility] was emailed requesting an intake. [facility] reviewed the patient's chart and recommend ambulatory for MH follow-up. Writer is waiting to hear back from [facility] intake appointment.    Ambulatory was paged for [facility] appointment. Appointment made for 10/2.    Next Steps: Prepare for patient discharge.  [name], LMSW   Pager: [phone]

## 2015-12-11 NOTE — Plan of Care (Signed)
Risk for Suicide     Patient Will Be Safe And Free From Injury Throughout Hospitalization Maintaining          Risk for Suicide     Patient Will Verbalize Understanding Of Role Of Medications In Stabilizing Symptoms By Day 3 Progressing towards goal     Patient Will Identify 2 Alternative Ways Of Dealing With Stress And Emotional Problems By Day 3 Progressing towards goal        Patient has not changed significantly from previous shifts. She has been more seclusive to her room this shift, and reports that it is due to nausea. She refused her 1700 seroquel but was compliant with HS meds. Currently she denies SI/HI/AVH.

## 2015-12-11 NOTE — Plan of Care (Signed)
Risk for Suicide     Patient Will Be Safe And Free From Injury Throughout Hospitalization Maintaining     Patient Will Verbalize Understanding Of Role Of Medications In Stabilizing Symptoms By Day 3 Maintaining     Patient Will Identify 2 Alternative Ways Of Dealing With Stress And Emotional Problems By Day 3 Maintaining          Evening 12/10/15.   Pt presents as pleasant and cooperative on contact however, remains with low frustration tolerance and did get upset when she was told that her fiancee could not sleep in her bed while visiting.  Denies SI/VAH.  Mood is dysphoric . Denies any other safety issues. Requested and given prn Tylenol for R arm pain.   Makes needs/wants known. Fiancee in to visit and visit went well.

## 2015-12-11 NOTE — Telephone Encounter (Addendum)
[facility]. Per chart review it was determined Jenna [name] should be referred to Ambulatory psych or an mh facility close to where she lives. Social worker Rhett BannisterJennifer Castiglione was okay with this plan. Patient was also [facility] to [facility] by SW. [facility] intake was not given.

## 2015-12-11 NOTE — Progress Notes (Signed)
Psychiatric Daily Progress Note    I saw and examined this patient in the presence of NP and SW today. I reviewed nurses' notes as well as all other notes, lab tests and reports made during the last 24 hours. I discussed the case with members of the treatment team.    HPI: (Comment on the development of the patient's present illness from the previous encounter) (Level 1 &2: 1 - 3 Elements, Level 3: 4 Elements)    Still struggling with sleep ; overall mood is stabilizing; no SI    Review of Systems (Level 2: 1 System, Level 3:  2-9 Systems):    Review of Systems   All other systems reviewed and are negative.      Mental Status Exam - Psych. Exam (Level 2&3: 2-7 Elements of the MSE):    PSYCH EXAM:     Constitutional  Appearance:  Dressed appropriately  Vital Signs: BP 101/53 (BP Location: Left arm)   Pulse 72   Temp 36.5 C (97.7 F) (Temporal)    Resp 16   LMP 09/13/2015 (Within Weeks)   SpO2 100%    Musculoskeletal  Station/ Gait:  Normal  Muscle Strength:  Normal  Muscle Tone:  Normal    Psychiatric  Psychomotor Activity:  Normal  Abnormal Movements:  None  Relationship to Interviewer:  Cooperative  Speech:  Normal  Language:  Normal comprehension  Mood:  Dysphoric  Affect:  Dysphoric  Thought Process:  Linear/ goal directed  Thought Content:  No suicidal ideation and no homicidal ideation  Cognition:  Fair attention span  Perceptions/ Associations:  No hallucinations  Insight:  Poor and improving  Judgement:  Poor  Sensorium:  Alert  Fund of Knowledge:  Normal         Physical Exam (if applicable):    Physical Exam   Constitutional: She appears well-developed and well-nourished.   Nursing note and vitals reviewed.      Scheduled Meds:   traZODone  100 mg Oral Nightly    sertraline  100 mg Oral Daily    QUEtiapine  25 mg Oral TID    chlorhexidine   Topical Daily    prenatal plus iron  1 tablet Oral Daily     Continuous Infusions:   PRN Meds:.ondansetron, albuterol HFA, QUEtiapine, acetaminophen, aluminum &  magnesium hydroxide w/ simethicone, magnesium hydroxide         Needs safe housing    Labs   All labs in the last 24 hours: No results found for this or any previous visit (from the past 24 hour(s)).    Diagnostic Impression  Active Problems:    Cocaine abuse    Pregnancy with 10 completed weeks gestation    MDD/ Penni BombardBord Pers D/O    Assessment/Plan:  Stabilize further  Needs safe housing then can be discharged      Author: Charna ElizabethLISA Kanai Hilger, MD  as of: 12/11/2015  at: 11:10 AM

## 2015-12-11 NOTE — Plan of Care (Signed)
Risk for Suicide     Patient Will Be Safe And Free From Injury Throughout Hospitalization Maintaining     Patient Will Verbalize Understanding Of Role Of Medications In Stabilizing Symptoms By Day 3 Maintaining     Patient Will Identify 2 Alternative Ways Of Dealing With Stress And Emotional Problems By Day 3 Maintaining        Night 12/11/15  Patient appeared to sleep throughout the night.

## 2015-12-11 NOTE — Continuity of Care (Signed)
[facility]  PHARMACY FORM     Todays date:  December 11, 2015    Patient Name: Jenna [name]      Medical Record #: [id number]   DOB: 11/30/1985  Patients Address: [address]                Social Worker: Wenda LowJENNIFER A , LMSW       Date of Service: December 11, 2015       Funding Source: SW Medication Assistance Fund  ___________________________________________________________________    Pharmacy Information:  Date/time sent: December 11, 2015     Time needed: by 9am on 12/12/15    Patient Location: Inpatient (specify unit)  [id number]    Medication Pick-up Preference: Please tube to station # [phone] when ready    Pharmacy Contact: Sue LushAndrea  Social work signature: Wenda LowJENNIFER A , LMSW  Supervisor/Manager Approval (if indicated):  Date:  Museum/gallery exhibitions officer(supervisor signature not required for Medicaid pending)

## 2015-12-11 NOTE — Telephone Encounter (Signed)
Writer received a priority access request from Boneta LucksJenny [phone]) for a COPS intake appointment for pt.     Pt is being discharged from [facility] on 12/12/15. Pt scheduled for [facility] intake on Monday, 12/15/15, with a 2:15 pm financial interview and a 3:00 pm diagnostic assessment with Ihor GullyStephen Haramis. Pt is also being referred to [facility].     If patient should miss this intake appointment patient can call [phone] to reschedule for the next available intake.

## 2015-12-11 NOTE — Progress Notes (Signed)
Notes and handoff reviewed and discussed with MD and SW.  Pt seen this morning and plan of care discussed with current attending physician Dr. Charna ElizabethLisa Slimmer and Rhett BannisterJennifer Castiglione, LMSW.   Descriptive Sentence  Jenna [name] is a 30 y.o. female [redacted] weeks pregnant.with history significant for depression, prior SI/attempt, and substance use.admitted with worsening depression and SI.      Active Issues   Depression   Tearful   Pregnant   Insomnia   Homeless   Cysts   Multiple psychosocial stressors   Mood swings   Nausea    Asthma   Poor sleep        To Do List   Schedule F/U OB appointment upon discharge   Discharge possibly tomorrow   No medication changes              Anticipatory Guidance  9.13  SP        Attending Handoff

## 2015-12-11 NOTE — Plan of Care (Signed)
Risk for Suicide     Patient Will Be Safe And Free From Injury Throughout Hospitalization Maintaining          Risk for Suicide     Patient Will Verbalize Understanding Of Role Of Medications In Stabilizing Symptoms By Day 3 Progressing towards goal     Patient Will Identify 2 Alternative Ways Of Dealing With Stress And Emotional Problems By Day 3 Progressing towards goal          Pt exhibiting dysphoric mood and affect, brightens on contacts. Pt is polite and cooperative. Denies SI/HI. Pt's fiance in to visit, and she tolerated this well. Pt accepted most scheduled meds, refused Chlorhexidine soap. Pt requested PRN Zofran x1 for nausea with positive effect. Pt is eating and drinking well. Makes needs known.     Mental Status Exam  Appearance: Appropriately dressed  Relationship to Interviewer: Cooperative, Eye contact good  Psychomotor Activity: Normal  Abnormal Movements: None  Muscle Strength and Tone: Normal  Station/Gait : Normal  Speech : Regular rate, Normal rhythm  Language: Normal comprehension  Mood: Dysphoric  Affect: Dysphoric  Thought Process: Sequential, Goal-directed  Thought Content: No suicidal ideation, No homicidal ideation  Perceptions/Associations : No hallucinations  Sensorium: Alert  Cognition: Fair attention span  Fund of Knowledge: Normal  Insight : Poor  Judgement: Poor

## 2015-12-12 ENCOUNTER — Telehealth: Payer: Self-pay

## 2015-12-12 NOTE — Plan of Care (Signed)
Risk for Suicide     Patient Will Be Safe And Free From Injury Throughout Hospitalization Maintaining     Patient Will Verbalize Understanding Of Role Of Medications In Stabilizing Symptoms By Day 3 Maintaining     Patient Will Identify 2 Alternative Ways Of Dealing With Stress And Emotional Problems By Day 3 Maintaining        Eves 2000-0030 Patient  received hs meds from previous RN and slept without difficulty.  She offered no concerns, Napoleon FormKelli Mechelle Pates, RN

## 2015-12-12 NOTE — Plan of Care (Addendum)
Risk for Suicide     Patient Will Be Safe And Free From Injury Throughout Hospitalization Maintaining          Risk for Suicide     Patient Will Verbalize Understanding Of Role Of Medications In Stabilizing Symptoms By Day 3 Progressing towards goal     Patient Will Identify 2 Alternative Ways Of Dealing With Stress And Emotional Problems By Day 3 Progressing towards goal          Pt presents with dysphoric mood and congruent affect stating, "I'm sad." Guarded and mostly room seclusive this shift except to make phone calls to boyfriend . Disappointed that aunt is unwilling to take her in at this time. Denies SI/HI/AVH. Med compliant and no prns requested or needed. [place] which appeared to go well. Report to P. Rockwell, RN     Evenings 1600-2000: No significant change from previous shift. Denies SI/HI/AVH. Requested prn tylenol for complaints of pain from hydradenitis with moderate effect. Pt is concerned that hydradenitis may be "flaring" again and feels that cysts may be reforming. Encouraged to utilize scheduled chlorhexadine to clean thoroughly. Med compliant. Significant other visited for the duration of this part of shift. Present in the milieu with limited interactions. Report to C. DeStefano, RN

## 2015-12-12 NOTE — Telephone Encounter (Signed)
Writer received a priority access request from Boneta LucksJenny [phone]) to re-schedule COPS intake appointment for pt as discharge date has changed. Pt is being discharged from [facility]998 on 10/2 or 12/16/15.     Pt re-scheduled for [facility] intake on Tuesday, 12/23/15, with a 10:00 am financial interview and a 10:45 am diagnostic assessment with Janus MolderNatalie McLaren. Pt is also being referred to [facility].     If patient should miss this intake appointment patient can call [phone] to reschedule for the next available intake.

## 2015-12-12 NOTE — Progress Notes (Signed)
Psych Social Work Progress Note  Chart reviewed and Discussed patient with treatment team    Contact with: Patient and Inpatient team/staff (MD,NP, Nurse, Psych Tech)    Contact type: Rounding and Treatment Planning    Purpose of Contact: Continue assessment, Referrals and Discharge planning/Continuity of care planning    Psychosocial Risk Factors Risk Factors: Yes, Homeless: Yes, Homeless comments: Pt does not have stable housing and has been sanctioned. , Suspected Substance Abuse: Yes - Addictive Behavior Screen must be completed, Suspected substance abuse comments: Pt was using substances, Lacks Income or payment mechanism: Yes, Lacks income or payment mechanism comments: Pt does not income, Risk of Violence to Self: Yes, Risk of violence to self comments: pt has suicidal thoughts, Hx of Psychological Trauma: Yes, Hx of psychological trauma comments: Pt lost an infant to SIDS    Narrative: Probation officer met with the patient during rounds with the treatment team. She was informed the bed at [facility] is not ready yet, as the other individual was unable to move out. There will be a bed early next week for her. The patient was disappointed and about this. It was asked to the team if she could stay with family temporarily then go to [facility] which was confirmed she can. Following rounds the patient called her aunt who is unable to house her for the weekend due to it being full in her home already. The patient was provided the number for [facility] and [employer].     COPS appointment was re-scheduled due to change in discharge date.     Next Steps: Patient is to call various housing placements to see her options. If there is no availability, patient will stay over the weekend and discharge to [facility] early next week.  Jenna [name], LMSW   Pager: [phone]

## 2015-12-12 NOTE — Plan of Care (Addendum)
Risk for Suicide     Patient Will Be Safe And Free From Injury Throughout Hospitalization Maintaining          Risk for Suicide     Patient Will Verbalize Understanding Of Role Of Medications In Stabilizing Symptoms By Day 3 Progressing towards goal     Patient Will Identify 2 Alternative Ways Of Dealing With Stress And Emotional Problems By Day 3 Progressing towards goal        Night 12/12/15  Patient appeared to sleep throughout the night.  Patient utilized PRN Tylenol for 8/10 headache.  Awaiting effect.

## 2015-12-12 NOTE — Progress Notes (Signed)
Psychiatric Daily Progress Note    I saw and examined this patient in the presence of NP and SW today. I reviewed nurses' notes as well as all other notes, lab tests and reports made during the last 24 hours. I discussed the case with members of the treatment team.    HPI: (Comment on the development of the patient's present illness from the previous encounter) (Level 1 &2: 1 - 3 Elements, Level 3: 4 Elements)    Morrie Sheldonshley remains dysphoric and in need of safe housing/ awaits [facility]/ no SI/ admitted initially w SI and mood lability and cocaine dependence.    Review of Systems (Level 2: 1 System, Level 3:  2-9 Systems):    Review of Systems   All other systems reviewed and are negative.      Mental Status Exam - Psych. Exam (Level 2&3: 2-7 Elements of the MSE):    PSYCH EXAM:     Constitutional  Appearance:  Dressed appropriately  Vital Signs: BP 131/59 (BP Location: Left arm)   Pulse 85   Temp 36.7 C (98.1 F) (Temporal)    Resp 16   LMP 09/13/2015 (Within Weeks)   SpO2 100%    Musculoskeletal  Station/ Gait:  Normal  Muscle Strength:  Normal  Muscle Tone:  Normal    Psychiatric  Psychomotor Activity:  Normal  Abnormal Movements:  None  Relationship to Interviewer:  Cooperative  Speech:  Normal  Language:  Normal comprehension  Mood:  Dysphoric  Affect:  Dysphoric  Thought Process:  Linear/ goal directed  Thought Content:  No suicidal ideation and no homicidal ideation  Cognition:  Fair attention span  Perceptions/ Associations:  No hallucinations  Insight:  Poor and improving  Judgement:  Poor  Sensorium:  Alert  Fund of Knowledge:  Normal         Physical Exam (if applicable):    Physical Exam   Constitutional: She appears well-developed and well-nourished.   Nursing note and vitals reviewed.      Scheduled Meds:   traZODone  100 mg Oral Nightly    sertraline  100 mg Oral Daily    QUEtiapine  25 mg Oral TID    chlorhexidine   Topical Daily    prenatal plus iron  1 tablet Oral Daily     Continuous Infusions:   PRN  Meds:.ondansetron, albuterol HFA, QUEtiapine, acetaminophen, aluminum & magnesium hydroxide w/ simethicone, magnesium hydroxide         Reason(s) for continued stay: Stabilization of gains and Need for safe discharge    Labs   All labs in the last 24 hours: No results found for this or any previous visit (from the past 24 hour(s)).    Diagnostic Impression  Principal Problem:    Depression    MDD/ Borderline Pers D/O/ Cocaine Dependence    Assessment/Plan:  Stabilize further  Can be discharge when a safe place to go is secured; currently awaits [facility] bed      Author: Charna ElizabethLISA Ebin Palazzi, MD  as of: 12/12/2015  at: 11:59 AM

## 2015-12-12 NOTE — Progress Notes (Signed)
Notes and handoff reviewed and discussed with MD and SW.  Pt seen this morning and plan of care discussed with current attending physician Dr. Charna ElizabethLisa Slimmer and Rhett BannisterJennifer Castiglione, LMSW.   Descriptive Sentence  Jenna [name] is a 30 y.o. female [redacted] weeks pregnant.with history significant for depression, prior SI/attempt, and substance use.admitted with worsening depression and SI.      Active Issues   Depression   Tearful   Pregnant   Insomnia   Homeless   Cysts   Multiple psychosocial stressors   Mood swings   Nausea    Asthma   Poor sleep        To Do List   OB appointment requested for outpatient   Discharge postponed till early next week    Discharge instructions done as well as RX ordered   No medication changes              Anticipatory Guidance  9.13  SP

## 2015-12-13 NOTE — Plan of Care (Signed)
Risk for Suicide    • Patient Will Be Safe And Free From Injury Throughout Hospitalization Progressing towards goal    • Patient Will Verbalize Understanding Of Role Of Medications In Stabilizing Symptoms By Day 3 Progressing towards goal    • Patient Will Identify 2 Alternative Ways Of Dealing With Stress And Emotional Problems By Day 3 Progressing towards goal        Slept all night

## 2015-12-13 NOTE — Plan of Care (Addendum)
Risk for Suicide     Patient Will Be Safe And Free From Injury Throughout Hospitalization Progressing towards goal     Patient Will Verbalize Understanding Of Role Of Medications In Stabilizing Symptoms By Day 3 Progressing towards goal     Patient Will Identify 2 Alternative Ways Of Dealing With Stress And Emotional Problems By Day 3 Progressing towards goal          Days [phone]: Patient mostly seclusive to room but out to make wants and needs known.  Friend in to visit.  Told writer she is looking forward to leaving and hopes it is soon.  Used PRN Tylenol for hydradenitis.

## 2015-12-14 MED ORDER — CLINDAMYCIN PHOSPHATE 1 % EX SOLN *I*
Freq: Two times a day (BID) | CUTANEOUS | Status: DC
Start: 2015-12-14 — End: 2015-12-16
  Filled 2015-12-14: qty 60

## 2015-12-14 NOTE — Plan of Care (Signed)
Risk for Suicide     Patient Will Be Safe And Free From Injury Throughout Hospitalization Progressing towards goal     Patient Will Verbalize Understanding Of Role Of Medications In Stabilizing Symptoms By Day 3 Progressing towards goal     Patient Will Identify 2 Alternative Ways Of Dealing With Stress And Emotional Problems By Day 3 Progressing towards goal          Days 12/14/2015: Pt presents as cooperative and engages well on contacts. Dysphoric mood with depressed affect exhibited. Sequential thought process exhibited; pt able to answer assessment questions and make needs and wants known. Denies SI/HI/AVH. Med compliant. No PRNs requested or utilized. Intermittently present in milieu with limited peer interactions noted. Fiancee in to visit this shift. Makes needs and wants known. Report given to Maryjean MornPhil R., RN by Arville GoSarah Bubel, RN.     Mental Status Exam  Appearance: Appropriately dressed  Relationship to Interviewer: Cooperative  Psychomotor Activity: Normal  Abnormal Movements: None  Muscle Strength and Tone: Normal  Station/Gait : Normal  Speech : Regular rate, Normal tone, Normal rhythm  Language: Normal comprehension  Mood: Dysphoric  Affect: Depressed  Thought Process: Sequential  Thought Content: No suicidal ideation, No homicidal ideation  Perceptions/Associations : No hallucinations  Sensorium: Alert  Cognition: Fair attention span  Fund of Knowledge: Normal  Insight : Poor  Judgement: Poor

## 2015-12-14 NOTE — Plan of Care (Signed)
Care Plan Goals      Risk for Suicide       Patient Will Be Safe And Free From Injury Throughout Hospitalization Maintaining       Patient Will Verbalize Understanding Of Role Of Medications In Stabilizing Symptoms By Day 3 Maintaining       Patient Will Identify 2 Alternative Ways Of Dealing With Stress And Emotional Problems By Day 3 Maintaining        Evening 12/14/15: Mostly seclusive to room except to make needs known. Fiance into visit. At beginning of shift writer found pt and fiance laying in bed together; cooperative in using chair instead but stated they didn't know they couldn't be in bed together here. C/o pain from hydradenitis cyst in right axilla and reported that she had 4 more in bilateral groin. PRN Tylenol had some effect. Showered and used chlorhexidine soap per order; MIPS added order for clindamycin topical and warm moist soaks. Pt only completed moist compress to axilla and only applied clindamycin to axillary cyst as she stated the bilateral groin cysts were too painful as clindamycin topical application stung. Denies current SI/HI/AVH. Compliant with all other scheduled medications.  Fredrich RomansKatlin J Lyfe Monger, RN  12/14/2015  11:51 PM

## 2015-12-14 NOTE — Plan of Care (Signed)
Risk for Suicide    • Patient Will Be Safe And Free From Injury Throughout Hospitalization Progressing towards goal    • Patient Will Verbalize Understanding Of Role Of Medications In Stabilizing Symptoms By Day 3 Progressing towards goal    • Patient Will Identify 2 Alternative Ways Of Dealing With Stress And Emotional Problems By Day 3 Progressing towards goal        Slept all night

## 2015-12-14 NOTE — Plan of Care (Signed)
Risk for Suicide     Patient Will Be Safe And Free From Injury Throughout Hospitalization Progressing towards goal     Patient Will Verbalize Understanding Of Role Of Medications In Stabilizing Symptoms By Day 3 Progressing towards goal     Patient Will Identify 2 Alternative Ways Of Dealing With Stress And Emotional Problems By Day 3 Progressing towards goal          Pt cooperative on contacts. Dysphoric & irritable at times. Pts boyfriend visited until 2000, appeared to tolerate well. Compliant with meds. Prn tylenol given x1 with positive effect. Present in milieu, often on phone; no peer interactions observed. Denies SI/HI/AVH.  Coralee PesaKyle Denetta Fei, RN

## 2015-12-14 NOTE — Progress Notes (Addendum)
-  notified by RN that pt having a "hydradenitis flare" with painful abscesses in axilla and groin. This has been pursued previously during this admission by MIPS and per notes, this did not require lancing by Surgery. She was instructed to follow up with Dr. Alvester MorinBell upon discharge.    -continue chlorhexidine washes  -add clindamycin topical bid  -add warm soaks prn for comfort  -Primary team can call Surgery consult tomorrow, but this does not appear warranted at this time as per previous notes. Could also consider asking Dermatology if they have any other suggestions

## 2015-12-14 NOTE — Progress Notes (Signed)
Prn tylenol x1 for cyst pain 8/10 results to be assessed.

## 2015-12-15 MED ORDER — INFLUENZA VAC SPLIT QUAD (FLULAVAL) 0.5 ML IM SUSY PF(>=6 MONTHS) *I*
0.5000 mL | PREFILLED_SYRINGE | INTRAMUSCULAR | Status: AC
Start: 2015-12-15 — End: 2015-12-15
  Administered 2015-12-15: 0.5 mL via INTRAMUSCULAR
  Filled 2015-12-15: qty 0.5

## 2015-12-15 MED ORDER — CLINDAMYCIN PHOSPHATE 1 % EX SOLN *I*
Freq: Two times a day (BID) | CUTANEOUS | 0 refills | Status: DC
Start: 2015-12-15 — End: 2016-01-24

## 2015-12-15 NOTE — Plan of Care (Signed)
Risk for Suicide     Patient Will Be Safe And Free From Injury Throughout Hospitalization Progressing towards goal     Patient Will Verbalize Understanding Of Role Of Medications In Stabilizing Symptoms By Day 3 Progressing towards goal     Patient Will Identify 2 Alternative Ways Of Dealing With Stress And Emotional Problems By Day 3 Progressing towards goal        EVENINGS 12/15/15 Pt out in the milieu and social with peers. Med compliant, eating and drinking. Able to make needs known. Boyfriend in to visit . Charleen Kirksatherine M Evlyn Amason, RN

## 2015-12-15 NOTE — Plan of Care (Signed)
Risk for Suicide     Patient Will Be Safe And Free From Injury Throughout Hospitalization Progressing towards goal     Patient Will Verbalize Understanding Of Role Of Medications In Stabilizing Symptoms By Day 3 Progressing towards goal     Patient Will Identify 2 Alternative Ways Of Dealing With Stress And Emotional Problems By Day 3 Progressing towards goal        DAY 12/15/15 Pt is in good spirits and enjoying her boyfriend. Became very anxious while accepting the flu shot. " I am so afraid of needles." Pt's boyfriend was helpful  in distracting pt while getting the flu shot. Able to make needs known. Eating and drinking. Charleen Kirksatherine M Anely Spiewak, RN

## 2015-12-15 NOTE — Continuity of Care (Signed)
[facility] SOCIAL WORK  PHARMACY FORM     Todays date:  December 15, 2015    Patient Name: Jenna [name]      Medical Record #: [id number]   DOB: 12/24/1985  Patients Address: [address]                Social Worker: Wenda LowJENNIFER A , LMSW       Date of Service: December 15, 2015       Funding Source: SW Medication Assistance Fund  ___________________________________________________________________    Pharmacy Information:  Date/time sent: December 15, 2015     Time needed: tomorrow am    Patient Location: Inpatient (specify unit)  [id number]    Medication Pick-up Preference: Please call ext. [phone] when ready    Pharmacy Contact: Toni Amendourtney   Social work signature: Wenda LowJENNIFER A , LMSW  Supervisor/Manager Approval (if indicated):  Date:  Museum/gallery exhibitions officer(supervisor signature not required for Medicaid pending)

## 2015-12-15 NOTE — Progress Notes (Signed)
Psychiatric Daily Progress Note    I saw and examined this patient in the presence of SW today. I reviewed nurses' notes as well as all other notes, lab tests and reports made during the last 24 hours. I discussed the case with members of the treatment team.    HPI: (Comment on the development of the patient's present illness from the previous encounter) (Level 1 &2: 1 - 3 Elements, Level 3: 4 Elements)    Pt is anxious for discharge and denies SI/HI/AH/VH.  Denies any SE to meds.    Review of Systems (Level 2: 1 System, Level 3:  2-9 Systems):    Review of Systems   Constitutional: Negative.    Skin:        Pain from right axillary and groin hydradenitis    Psychiatric/Behavioral: Negative.        Mental Status Exam - Psych. Exam (Level 2&3: 2-7 Elements of the MSE):    PSYCH EXAM:     Constitutional  Appearance:  Well-groomed, dressed appropriately and no acute distress  Vital Signs: BP 142/65 (BP Location: Right arm)   Pulse 82   Temp 36.7 C (98.1 F) (Temporal)    Resp 16   LMP 09/13/2015 (Within Weeks)   SpO2 98%    Musculoskeletal  Station/ Gait:  Normal  : not tested.  : not tested.    Psychiatric  Psychomotor Activity:  Normal  Abnormal Movements:  None  Relationship to Interviewer:  Engages well, cooperative and friendly  Speech:  Normal  Language:  Normal comprehension  Mood:  Euthymic  Affect:  Full range  Thought Process:  Sequential and logical  Thought Content:  No suicidal ideation and no homicidal ideation  Cognition:  Good attention span  Perceptions/ Associations:  No hallucinations  Insight:  Good  Judgement:  Good  Sensorium:  Alert, oriented to person and oriented to place  Fund of Knowledge:  Normal         Physical Exam (if applicable):    Physical Exam   Nursing note and vitals reviewed.      Scheduled Meds:   clindamycin   Topical 2 times per day    traZODone  100 mg Oral Nightly    sertraline  100 mg Oral Daily    QUEtiapine  25 mg Oral TID    chlorhexidine   Topical Daily     prenatal plus iron  1 tablet Oral Daily     Continuous Infusions:   PRN Meds:.ondansetron, albuterol HFA, QUEtiapine, acetaminophen, aluminum & magnesium hydroxide w/ simethicone, magnesium hydroxide         Reason(s) for continued stay: Stabilization of gains and Need for safe discharge    Labs   All labs in the last 24 hours: No results found for this or any previous visit (from the past 24 hour(s)).    Diagnostic Impression  Principal Problem:    Depression      Assessment/Plan:    Continue all meds as prescribed  Waiting for ALR bed    Greater than 50 % of time with patient was spent with counseling / coordination of care. Time spent: n/a  Author: Quitman LivingsKELLY Edrie Ehrich, NP  as of: 12/15/2015  at: 1:32 PM

## 2015-12-15 NOTE — Plan of Care (Signed)
Risk for Suicide    • Patient Will Be Safe And Free From Injury Throughout Hospitalization Progressing towards goal    • Patient Will Verbalize Understanding Of Role Of Medications In Stabilizing Symptoms By Day 3 Progressing towards goal    • Patient Will Identify 2 Alternative Ways Of Dealing With Stress And Emotional Problems By Day 3 Progressing towards goal        Slept all night

## 2015-12-15 NOTE — Progress Notes (Signed)
PRN Seroquel administered for c/o of anxiety

## 2015-12-15 NOTE — Plan of Care (Signed)
Risk for Suicide     Patient Will Be Safe And Free From Injury Throughout Hospitalization Maintaining          Risk for Suicide     Patient Will Verbalize Understanding Of Role Of Medications In Stabilizing Symptoms By Day 3 Progressing towards goal     Patient Will Identify 2 Alternative Ways Of Dealing With Stress And Emotional Problems By Day 3 Progressing towards goal          [id number] Patient presents with euthymic mood and affect. Frequently in milieu interacting well and joking with staff and peers.  Denies SI/HI/AVH. Continues to c/o pain in bilateral groin and right axilla from cysts, PRN tylenol requested and given with positive effect. Med compliant and tolerating.

## 2015-12-16 ENCOUNTER — Other Ambulatory Visit: Payer: Self-pay

## 2015-12-16 NOTE — Progress Notes (Signed)
Psychiatric Daily Progress Note    I saw and examined this patient in the presence of NP and SW today. I reviewed nurses' notes as well as all other notes, lab tests and reports made during the last 24 hours. I discussed the case with members of the treatment team.    HPI: (Comment on the development of the patient's present illness from the previous encounter) (Level 1 &2: 1 - 3 Elements, Level 3: 4 Elements)    Patient admitted with depressive sx; emotional dysregulation; SI and cocaine abuse was stabilized and started on ZOloft, seroquel for anxiety and Trazodone and worked on [employer] skills; also safe housing ws required for safe discharge plan; now with stable mood and no SI pregnant;     Review of Systems (Level 2: 1 System, Level 3:  2-9 Systems):    Review of Systems   All other systems reviewed and are negative.      Mental Status Exam - Psych. Exam (Level 2&3: 2-7 Elements of the MSE):    PSYCH EXAM:     Constitutional  Appearance:  Dressed appropriately  Vital Signs: BP 107/55 (BP Location: Right arm)   Pulse 71   Temp 36.9 C (98.4 F) (Temporal)    Resp 16   LMP 09/13/2015 (Within Weeks)   SpO2 99%    Musculoskeletal  Station/ Gait:  Normal  Muscle Strength:  Normal  Muscle Tone:  Normal    Psychiatric  Psychomotor Activity:  Normal  Abnormal Movements:  None  Relationship to Interviewer:  Cooperative  Speech:  Normal  Language:  Normal comprehension  Mood:  Dysphoric  Affect:  Dysphoric  Thought Process:  Linear/ goal directed  Thought Content:  No suicidal ideation and no homicidal ideation  Cognition:  Fair attention span  Perceptions/ Associations:  No hallucinations  Insight:  Improving  Judgement:  Fair  Sensorium:  Alert  Fund of Knowledge:  Normal         Physical Exam (if applicable):    Physical Exam   Constitutional: She appears well-developed and well-nourished.   Nursing note and vitals reviewed.      Scheduled Meds:   clindamycin   Topical 2 times per day    traZODone  100 mg Oral Nightly     sertraline  100 mg Oral Daily    QUEtiapine  25 mg Oral TID    chlorhexidine   Topical Daily    prenatal plus iron  1 tablet Oral Daily     Continuous Infusions:   PRN Meds:.ondansetron, albuterol HFA, QUEtiapine, acetaminophen, aluminum & magnesium hydroxide w/ simethicone, magnesium hydroxide         Reason(s) for continued stay: Need for safe discharge    Labs   All labs in the last 24 hours: No results found for this or any previous visit (from the past 24 hour(s)).    Diagnostic Impression  Principal Problem:    Depression    MDD/ [employer] [employer] Pers D?O; Cocaine Dep    Assessment/Plan:  D/c to safe housing/ will go to [facility] when opening occurs      Author: Charna ElizabethLISA Yago Ludvigsen, MD  as of: 12/16/2015  at: 2:21 PM

## 2015-12-16 NOTE — Plan of Care (Signed)
Risk for Suicide     Patient Will Be Safe And Free From Injury Throughout Hospitalization Maintaining          Risk for Suicide     Patient Will Verbalize Understanding Of Role Of Medications In Stabilizing Symptoms By Day 3 Progressing towards goal     Patient Will Identify 2 Alternative Ways Of Dealing With Stress And Emotional Problems By Day 3 Progressing towards goal        Night 12/16/15  Patient awoke briefly to use the bathroom at approximately 0330, otherwise appeared to sleep throughout the night.

## 2015-12-16 NOTE — Plan of Care (Signed)
Risk for Suicide     Patient Will Be Safe And Free From Injury Throughout Hospitalization Maintaining          Risk for Suicide     Patient Will Verbalize Understanding Of Role Of Medications In Stabilizing Symptoms By Day 3 Progressing towards goal     Patient Will Identify 2 Alternative Ways Of Dealing With Stress And Emotional Problems By Day 3 Progressing towards goal          Pt exhibiting dysphoric mood and appropriate affect. Present in the milieu interacting well with others. Pt accepted all scheduled meds, no PRNs requested or needed. Pt voiced feeling "very excited" for discharge, and said she feels she can be safe outside of the hospital. Pt denied pain and physical concerns. Denied SI/HI/AVH. Pt was discharged from unit to [facility] at 1215 with her fiance. At time of discharge, belongings were returned to pt & discharge instructions were reviewed using teach-back method. Pt verbalized understanding of discharge instructions, follow-up plan, current medications and feels safe for discharge. Patient's medications were given to patient at discharge. Dressed appropriately for the weather.

## 2015-12-16 NOTE — Progress Notes (Signed)
Psych Social Work Progress Note  Chart reviewed and Discussed patient with treatment team    Contact with: Patient, Engineer, drilling and Inpatient team/staff (MD,NP, Nurse, Psych Tech)    Contact type: Rounding, Telephone Contact and Face-to-Face    Purpose of Contact: Continue assessment, Referrals and Discharge planning/Continuity of care planning    Psychosocial Risk Factors Risk Factors: Yes, Homeless: Yes, Homeless comments: Pt does not have stable housing and has been sanctioned. , Suspected Substance Abuse: Yes - Addictive Behavior Screen must be completed, Suspected substance abuse comments: Pt was using substances, Lacks Income or payment mechanism: Yes, Lacks income or payment mechanism comments: Pt does not income, Risk of Violence to Self: Yes, Risk of violence to self comments: pt has suicidal thoughts, Hx of Psychological Trauma: Yes, Hx of psychological trauma comments: Pt lost an infant to SIDS    Narrative: Probation officer was notified by Pandora Leiter BSW that [facility] does not have a bed at this time. There will not be a bed until the end of the week now. Following this, [facility] was called again and asked if they had any available beds which they do. They need the patient to call and complete a phone screen.    Writer and patient called [facility] in the conference room and completed the phone screen. She is able to discharge to them today. [facility] will be provided the patient's phone number, she will be able to move into [facility] by Thursday or Friday.     Medicaid transportation was called, all appointments now have schedule transportation through [employer].    A voicemail was left for Vernie Shanks, CM at [place] her patient will be discharging to [facility] and then moving over to [facility] by the end of the week.     Met with the patient and went over her discharge information, appointments and transportation. She declined to complete a safety plan. A cab was offered and was  schedule, the patient did then change her mind and requested her fiance pick her up today. Cab was cancelled.     Next Steps: Plan for discharge today to [facility].  [name], LMSW   Pager: [phone]

## 2015-12-16 NOTE — Discharge Instructions (Signed)
Psychiatrist: Charna ElizabethLisa Slimmer, MD  Resident/NP:   Henrene PastorKathy Davis, NP/Kelly Boldt, NP  Social Worker:   Rhett BannisterJennifer , LMSW  Primary Care Physician:  Amador CunasERIC RICHARD, MD    Discharge Destination: You are discharging to [facility] located at [address], [place], [place] [id number]. Their phone number is [phone]. This is an 8 bed respite program. [place] will be providing you transportation there.     Brief Summary of Your Hospital Course (including key procedures and diagnostic test results):    You were admitted to the hospital because of worsening depression symptoms, fluctuations in mood, and you had thoughts of harming yourself. You were in your first trimester of pregnancy at the time of your admission. During your stay we started you on Quetiapine, Sertraline, and Trazodone for sleep. You responded well to these medications with better mood regulation and no longer experienced thoughts of harming yourself. Also during your stay a painful cyst caused by your hydradenitis was drained, with instruction to clean that area and any other cysts with chlorhexidine wash. Also you received Zofran 4 mg twice a day as needed for nausea associated with pregnancy. Because you had no home prior to your we helped you find housing for discharge. You stabilized well with no further thoughts of harming yourself or others and were able to be discharged from the acute care setting.    Information regarding Supportive Referrals and Follow-up care:     Alternative Living Residence, Chemical Dependency Outpatient , Community Mental Health Clinic and Care Management and Baby Love     1. Alternative Living Residence: You are waiting for a bed at [facility], located at [facility], [place] [id number]. They will call you when the bed is available.     2. Chemical Dependency Outpatient: You have an intake at [facility] on Wednesday, October 4th at 7:30am. [employer] will pick you up at 6:30am from [facility] for your  appointment. When your appointment is over, please call [employer] at [phone] for a return cab to [facility]. The invoice number is [id number].    3. Community Mental Health Clinic: You have an intake at [facility] on Tuesday, October 10th at 10:00am. [employer] will pick you up at 9:00am from [facility] to bring you to your appointment. Please call apple [employer] at [phone] for a return cab to [facility]. The invoice number for this trip is [id number].    4. Care Management: While admitted a care manager referral was completed. You will be working with [employer] and have been assigned to [place]. You may reach her at [phone].    5. Baby Love: A referral was placed for [facility], you may contact them at [phone] to follow-up on the status of your referral.     6. Medicaid Transportation: The phone number for medicaid transportation is [phone]. You may use this number to schedule your future appointments.     Recommendations:     You are to schedule a follow up appointment with Dr. Alvester MorinBell for your cysts follow up.    Diet: Regular - No restrictions  Activity: activity as tolerated    You are being discharged with:    Albuterol HFA inhaler    Medications filled     You were given a 30 day supply of your medications as listed below,  Chlorhexadine topical daily  Prenatal plus vitamins daily  Quetiapine 25 mg take 1 three times a day  Sertraline100 mg daily  Trazodone 100 mg at night for sleep   Acetaminophen 325 mg (as needed for pain)    Meds not filled:  Zofran (for nausea) consult your OB provider if needed  [facility] women's health is scheduling you an OB follow up appointment.    When to call for help if experiencing any troubling symptoms including thoughts to harm yourself or others or with any other questions about your discharge plan or medications please call:    Lif[facility]/Mobile Crisis Team: (24/7) [phone], [phone] (Out of [place]) [phone]     [facility]/Mobile Crisis Team:  (24/7) [phone], [phone] (Out of [place]) [phone]     Level of Outreach Indicated If Patient Fails to Attend Scheduled Mental Health Appointment: routine program follow up    Was the patient provided a printed copy of their After Visit Summary:     Yes

## 2015-12-16 NOTE — Discharge Summary (Signed)
Name: Jenna [name] MRN: [id number] DOB: 12/03/1985     Admit Date: 11/24/2015   Date of Discharge: 12/16/2015    Patient was accepted for discharge to   Home or Self Care [1]           Discharge Attending Physician: Charna ElizabethSLIMMER, LISA      Hospitalization Summary    CONCISE NARRATIVE:   Jenna [name] is a 30 year old female [redacted] weeks pregnant with a PMH of depression who was admitted to the hospital with worsening symptoms of depression that included SI.  Patient initially presented with labile mood, anxiety, poor impulse control and verbal disagreements with unit peers. She was started on Quetiapine 25 mg TID for anxiety/agitation, Zoloft 100 mg daily was prescribed for depression and anxiety and Trazodone 100 gm qhs was prescribed for sleep.  Patient was experiencing pain from suppurative hydradenitis, specifically in the right axilla. General Surgery assessed and drained the axillary cyst with good results. Patient used chlorhexidine wash and received tylenol PRN for pain.  Pt later developed cysts in her groin area and MIPS started pt on Clindamycin lotion. Oral antibiotics were not used as patient was 1st trimester pregnancy, as well as condition is chronic that requires long-term follow up with dermatology clinic (Dr. Alvester MorinBell).  Patient received Zofran 4 mg twice a day as needed for nausea associated with pregnancy.  Patient's mood stabilized and she no longer expressed suicidal thoughts or intent. As patient presented to us homeless, she was referred to [facility] for temporary housing.  There was a wait for a bed at [facility] so in the meantime pt chose to go to [facility] while waiting for the bed at [facility].  Prior to discharge pt was [facility] for outpatient mental health follow-up and [facility] for outpatient chemical dependency follow-up.  A care management referral was completed during this admission and a referral for Baby Love was also completed.  A 30 day supply of medications was filled for  pt to take to [facility] with her.  On the day of discharge pt denied suicidal and homicidal ideation, and as she no longer represented an imminent risk of harm to self or others from a psychiatric standpoint, she was able to be discharged from the acute care setting.             Signed: Quitman LivingsKELLY Diksha Tagliaferro, NP  On: 12/16/2015  at: 2:16 PM

## 2015-12-16 NOTE — Progress Notes (Signed)
Psychiatry Social Work Discharge Note    Discharge Information:   Reason for Hospitalization: Danger to Self  Follow Up Arrangements Made: See discharge instructions  Warm handoff with [facility] provider and/or CM completed: Yes  Warm handoff to? (person's name): Vernie Shanks  Teach back method was provided about follow-up care and supports: Yes  Discharge Continuing Care Plan Transmitted to [facility] Provider on:: 12/16/15  Discharge Information Faxed To:: Mental Health Provider, Other (comment)  Addictions Treatment Referral: Referral Sent  Addictions Treatment Referral to:: Chemical Dependency Outpatient  Agency Name: [facility]   Referrals Made: Care Management, Transitional Care ([facility], [facility]), Mental health, None - declined referral/ refused to authorize ROI for referral, Other Referral (comment) ([facility]. Baby love referral was completed by social worker Kim. )  Interventions: Collateral contacted, Family/significant other support, Housing obtained    Risk Factors:   Risk Factors: Yes  Homeless: Yes  Homeless comments: Pt does not have stable housing and has been sanctioned.   Suspected Substance Abuse: Yes - Addictive Behavior Screen must be completed  Suspected substance abuse comments: Pt was using substances  Lacks Income or payment mechanism: Yes  Lacks income or payment mechanism comments: Pt does not income  Risk of Violence to Self: Yes  Risk of violence to self comments: pt has suicidal thoughts  Hx of Psychological Trauma: Yes  Hx of psychological trauma comments: Pt lost an infant to SIDS    Status of Interventions addressing Risk Factors: Met with the patient on day of discharge. She denied SI/HI/AH/VH and is looking forward to leaving the hospital. Due to [facility] Living Residence not having an available bed till the end of the week, the patient will now be discharging to [facility] for additional support to help with her transition to [facility]. She is agreeable to this discharge plan. Her  medications were filled for 30 days and a social work voucher was needed due to lack of funds. Her fiance, [name] provided her transportation to [employer]. A safety plan was not completed, the patient declined to complete one.     In regards to risk factors, the patient's substance use was assessed and she has been arranged follow-up with [facility]. She has an appointment tomorrow at 7:30am, medicaid transportation will be taking her to this appointment. The patient does not have an income still and is sanctioned, but once she shows 3 weeks of treatment for chemical dependency her sanction will be lifted. She does receive food stamps and will be buying food upon discharge for [facility]. On day of discharge she denied all suicidal thoughts and was feeling "optimistic" and "hopeful".     While admitted a referral for care management was completed and the outreach CM Vernie Shanks came to the unit to meet with her. She was enrolled with services and will continue to be followed by them. A referral for baby love was also completed. [employer] was discussed but the patient declined this referral. A referral for AL[facility] was completed due to pt being homeless, which she was accepted to and will be there by the end of the week. The patient has a COPS at [facility] on 10/10, medicaid transportation was arranged for this appointment.     [place], LMSW

## 2015-12-17 ENCOUNTER — Ambulatory Visit: Payer: Self-pay

## 2015-12-17 ENCOUNTER — Telehealth: Payer: Self-pay | Admitting: Internal Medicine

## 2015-12-17 ENCOUNTER — Ambulatory Visit: Payer: Self-pay | Admitting: Obstetrics and Gynecology

## 2015-12-17 ENCOUNTER — Telehealth: Payer: Self-pay

## 2015-12-17 ENCOUNTER — Telehealth: Payer: Self-pay | Admitting: Obstetrics and Gynecology

## 2015-12-17 DIAGNOSIS — F1490 Cocaine use, unspecified, uncomplicated: Secondary | ICD-10-CM

## 2015-12-17 DIAGNOSIS — Z3A13 13 weeks gestation of pregnancy: Secondary | ICD-10-CM

## 2015-12-17 NOTE — Telephone Encounter (Signed)
Pt will follow up with PSYCH.

## 2015-12-17 NOTE — Telephone Encounter (Signed)
Jenna CalkinLeslie [name] from [facility] family resource is calling to report that she received 2 faxes last evening with medical information on patient. One at 8:18 Pm and another at 8:40 PM.    She states that patient is not registered with them but the information came through on fax number [phone]

## 2015-12-17 NOTE — Telephone Encounter (Signed)
Left message at [phone],regarding pt's missed NPI appt,now at 13.4 weeks.  Missed earlier morning MFM appt.  NOB is pending for 12/22/2015,J.Black.  Letter to pt via My Chart.  Message to J.Desimone,L.Jackson.

## 2015-12-18 ENCOUNTER — Telehealth: Payer: Self-pay

## 2015-12-18 NOTE — Telephone Encounter (Signed)
New referral too [facility] via work queue.

## 2015-12-18 NOTE — Telephone Encounter (Signed)
Pt left writer a vm needing to reschedule COPS intake appointment scheduled for 10/10. Pt was discharged from 3-9000on 12/16/15.     Writer returned call and pt has been re-scheduled for [facility] COP[facility] intake on Monday, 12/22/15, with a 12:45 pm financial interview and a 1:30 pm diagnostic assessment with Trula SladeWilliam Jarvie.

## 2015-12-18 NOTE — Telephone Encounter (Signed)
OK to schedule for next available NOB in [facility]. Also schedule for SW in conjunction with NOB visit.    Jenna Azzarello, MD  Maternal-Fetal Medicine  Pager: [phone]    Jenna Ayub, MD

## 2015-12-19 NOTE — Telephone Encounter (Signed)
Writer spoke with patient patient has an nob in [facility] with MD on 12/22/15 per patient had no idea I was calling patient is coming to her apt on 12/22/15 please advise?

## 2015-12-19 NOTE — Telephone Encounter (Signed)
Please schedule in [facility]. You can cancel her [facility] NOB appointment.    Jenna CarinaLisa Alanea Woolridge, MD  Maternal-Fetal Medicine  Pager: [phone]

## 2015-12-22 ENCOUNTER — Ambulatory Visit: Payer: Self-pay | Admitting: Clinical

## 2015-12-22 ENCOUNTER — Ambulatory Visit: Payer: Self-pay | Admitting: Obstetrics & Gynecology

## 2015-12-22 NOTE — Care Coordination Plan (Signed)
[facility] MISSED/CANCELLED APPOINTMENT     Name: Duwaine MaxinSHLEY Wehling  MRN: [id number]   DOB: 08/04/1985    Date of Scheduled Service: December 22, 2015    Ms. Katrinka BlazingSmith was a no show for today's intake appointment.  Writer called and left a voice message asking that client return writer's call.    Additional Information:    Not applicable

## 2015-12-26 ENCOUNTER — Ambulatory Visit: Payer: Self-pay

## 2015-12-26 ENCOUNTER — Telehealth: Payer: Self-pay

## 2015-12-26 ENCOUNTER — Other Ambulatory Visit: Payer: Self-pay

## 2015-12-26 DIAGNOSIS — Z8482 Family history of sudden infant death syndrome: Secondary | ICD-10-CM | POA: Insufficient documentation

## 2015-12-26 NOTE — Telephone Encounter (Signed)
Attempt made to contact pt at [phone].  No answer, [phone] message left asking pt to contact Doctors office ( no details left as female voice on answering service).  Writer also attempted to rach pt at [phone] ( listed as mobile phone). Female answering that number states " this is not Ashleys number"  ..Marland Kitchen

## 2015-12-26 NOTE — Telephone Encounter (Signed)
Telephone call to pt as she had been scheduled for phone NPI.  Pt clearly had forgotten but states she can talk with Clinical research associatewriter at 2:00pm in "[facility], adult education"  Writer agrees to call pt at 2:00pm.

## 2015-12-29 NOTE — Telephone Encounter (Signed)
Attempt made to contact pt at [phone].  Same female voice on answering machine, no repeat message left.  Female at [phone] states pt does not live there.  Pt has NOB scheduled for tomorrow, will attempt to  Complete paperwork at that time.

## 2015-12-30 ENCOUNTER — Ambulatory Visit: Payer: Self-pay

## 2015-12-30 ENCOUNTER — Telehealth: Payer: Self-pay

## 2015-12-30 NOTE — Telephone Encounter (Signed)
Attempt made to contact pt as she NOS [facility] appointment.   At first female answers the phone and begins to yell that pt is standing right there with him but " I can tell you right now, she wont come to her appointment.  I dont know what is wrong with her,  I tried to make her come."  Writer asks female if he can hand the phone to the pt.  Writer asks pt if [facility] and offers to assist pt in rescheduling.  Pt states " I have too much going on right now, going through some stuff and I cant handle this right now."  Writer explains that [facility] has availability to SW and if she were to come to an appointment, she  Could also meet with SW.  Pt then yells at writer and begins to cry "  There aint nobody that can help me!  I will call you when I am ready."   Phone was then disconnected.    Will leave encounter open and attempt to contact pt in 2-3 days.

## 2015-12-31 ENCOUNTER — Ambulatory Visit (HOSPITAL_COMMUNITY)
Admission: EM | Admit: 2015-12-31 | Discharge: 2015-12-31 | Disposition: A | Discharge status: Home / Self Care | Payer: Medicaid Other | Attending: Family Medicine | Admitting: Family Medicine

## 2015-12-31 ENCOUNTER — Encounter (HOSPITAL_COMMUNITY): Payer: Self-pay | Admitting: Family Medicine

## 2015-12-31 DIAGNOSIS — Z79899 Other long term (current) drug therapy: Secondary | ICD-10-CM | POA: Diagnosis not present

## 2015-12-31 DIAGNOSIS — L508 Other urticaria: Secondary | ICD-10-CM

## 2015-12-31 DIAGNOSIS — L2082 Flexural eczema: Secondary | ICD-10-CM | POA: Diagnosis not present

## 2015-12-31 DIAGNOSIS — R21 Rash and other nonspecific skin eruption: Secondary | ICD-10-CM | POA: Insufficient documentation

## 2015-12-31 DIAGNOSIS — J029 Acute pharyngitis, unspecified: Secondary | ICD-10-CM | POA: Diagnosis present

## 2015-12-31 LAB — POCT RAPID STREP A: STREPTOCOCCUS, GROUP A SCREEN (DIRECT): NEGATIVE

## 2015-12-31 LAB — POCT INFECTIOUS MONO SCREEN: MONO SCREEN: NEGATIVE

## 2015-12-31 MED ORDER — HYDROXYZINE HCL 25 MG PO TABS: 25.0000 mg | ORAL_TABLET | Freq: Four times a day (QID) | ORAL | 0 refills | Status: DC

## 2015-12-31 MED ORDER — METHYLPREDNISOLONE ACETATE 80 MG/ML IJ SUSP
INTRAMUSCULAR | Status: AC
  Filled 2015-12-31: qty 1

## 2015-12-31 MED ORDER — METHYLPREDNISOLONE ACETATE 80 MG/ML IJ SUSP
80.0000 mg | Freq: Once | INTRAMUSCULAR | Status: AC
  Administered 2015-12-31: 80 mg via INTRAMUSCULAR

## 2015-12-31 NOTE — ED Provider Notes (Signed)
[facility]    CSN: [id number] Arrival date & time: 12/31/15  1620     History   Chief Complaint Chief Complaint  Patient presents with  . Sore Throat  . Rash    HPI Samantha Maxinshley Nguyen is a 30 y.o. female.   The history is provided by the patient.  Sore Throat  This is a new problem. The current episode started more than 2 days ago. The problem has been gradually worsening. The symptoms are aggravated by swallowing.  Rash  Associated symptoms: sore throat     Past Medical History:  Diagnosis Date  . Anxiety   . Depressed   . Kidney stone   . Migraine     There are no active problems to display for this patient.   Past Surgical History:  Procedure Laterality Date  . CESAREAN SECTION      OB History    No data available       Home Medications    Prior to Admission medications   Medication Sig Start Date End Date Taking? Authorizing Provider  ibuprofen (ADVIL,MOTRIN) 200 MG tablet Take 400 mg by mouth every 6 (six) hours as needed for fever, headache, mild pain, moderate pain or cramping.    Historical Provider, MD  naproxen (NAPROSYN) 375 MG tablet Take 1 tablet (375 mg total) by mouth 2 (two) times daily. 07/07/15   Samantha Tripp Dowless, PA-C  norethindrone-ethinyl estradiol (MICROGESTIN FE 1/20) 1-20 MG-MCG tablet Take 1 tablet by mouth daily.    Historical Provider, MD  PARoxetine (PAXIL) 30 MG tablet Take 30 mg by mouth daily.    Historical Provider, MD    Family History History reviewed. No pertinent family history.  Social History Social History  Substance Use Topics  . Smoking status: Never Smoker  . Smokeless tobacco: Never Used  . Alcohol use Not on file     Allergies   Sulfa antibiotics   Review of Systems Review of Systems  Constitutional: Negative.   HENT: Positive for sore throat.   Respiratory: Negative.   Cardiovascular: Negative.   Gastrointestinal: Negative.   Genitourinary: Negative.   Skin: Positive for rash.    All other systems reviewed and are negative.    Physical Exam Triage Vital Signs ED Triage Vitals  Enc Vitals Group     BP 12/31/15 1740 149/67     Pulse Rate 12/31/15 1740 74     Resp 12/31/15 1740 16     Temp 12/31/15 1740 98.1 F (36.7 C)     Temp src --      SpO2 12/31/15 1740 98 %     Weight --      Height --      Head Circumference --      Peak Flow --      Pain Score 12/31/15 1741 7     Pain Loc --      Pain Edu? --      Excl. in GC? --    No data found.   Updated Vital Signs BP 149/67   Pulse 74   Temp 98.1 F (36.7 C)   Resp 16   SpO2 98%   Visual Acuity Right Eye Distance:   Left Eye Distance:   Bilateral Distance:    Right Eye Near:   Left Eye Near:    Bilateral Near:     Physical Exam  Constitutional: She is oriented to person, place, and time. She appears well-developed and well-nourished.  HENT:  Mouth/Throat:  Oropharyngeal exudate present.  Neck: Normal range of motion. Neck supple.  Cardiovascular: Normal rate and regular rhythm.   Pulmonary/Chest: Effort normal and breath sounds normal.  Abdominal: Soft. Bowel sounds are normal.  Lymphadenopathy:    She has no cervical adenopathy.  Neurological: She is alert and oriented to person, place, and time.  Skin: Skin is warm and dry.  Scattered pruritic papular lesions erythematous  Nursing note and vitals reviewed.    UC Treatments / Results  Labs (all labs ordered are listed, but only abnormal results are displayed) Labs Reviewed  POCT RAPID STREP A  strep and mono neg.  EKG  EKG Interpretation None       Radiology No results found.  Procedures Procedures (including critical care time)  Medications Ordered in UC Medications - No data to display   Initial Impression / Assessment and Plan / UC Course  I have reviewed the triage vital signs and the nursing notes.  Pertinent labs & imaging results that were available during my care of the patient were reviewed by me and  considered in my medical decision making (see chart for details).  Clinical Course      Final Clinical Impressions(s) / UC Diagnoses   Final diagnoses:  None    New Prescriptions New Prescriptions   No medications on file     Linna Hoff, MD 12/31/15 1845

## 2015-12-31 NOTE — ED Triage Notes (Signed)
Pt here for sore throat and rash to upper arms, neck and face.

## 2016-01-01 NOTE — Telephone Encounter (Signed)
PT NOS [facility] appointment, being followed in telephone encounter dated 12/30/15. Closing this encounter.

## 2016-01-02 ENCOUNTER — Encounter (HOSPITAL_COMMUNITY): Payer: Self-pay | Admitting: Emergency Medicine

## 2016-01-02 ENCOUNTER — Ambulatory Visit (HOSPITAL_COMMUNITY)
Admission: EM | Admit: 2016-01-02 | Discharge: 2016-01-02 | Disposition: A | Discharge status: Home / Self Care | Payer: Medicaid Other | Attending: Family Medicine | Admitting: Family Medicine

## 2016-01-02 DIAGNOSIS — L509 Urticaria, unspecified: Secondary | ICD-10-CM | POA: Diagnosis not present

## 2016-01-02 DIAGNOSIS — R21 Rash and other nonspecific skin eruption: Secondary | ICD-10-CM | POA: Diagnosis not present

## 2016-01-02 MED ORDER — METHYLPREDNISOLONE ACETATE 80 MG/ML IJ SUSP
INTRAMUSCULAR | Status: AC
  Filled 2016-01-02: qty 1

## 2016-01-02 MED ORDER — PREDNISONE 10 MG PO TABS: ORAL_TABLET | ORAL | 0 refills | Status: DC

## 2016-01-02 MED ORDER — RANITIDINE HCL 300 MG PO TABS: 300.0000 mg | ORAL_TABLET | Freq: Every day | ORAL | 0 refills | Status: DC

## 2016-01-02 MED ORDER — METHYLPREDNISOLONE ACETATE 80 MG/ML IJ SUSP
80.0000 mg | Freq: Once | INTRAMUSCULAR | Status: AC
  Administered 2016-01-02: 80 mg via INTRAMUSCULAR

## 2016-01-02 NOTE — ED Triage Notes (Signed)
[facility] a [facility] of a rash all over her body that started 3 days ago. The patient stated that she was evaluated on 12/31/2015 and given a steroid injection and vistaril PO. She stated that the rash went away initially but returned today.

## 2016-01-02 NOTE — ED Provider Notes (Signed)
CSN: [id number]     Arrival date & time 01/02/16  [id number] History   First MD Initiated Contact with Patient 01/02/16 1020     Chief Complaint  Patient presents with  . Rash   (Consider location/radiation/quality/duration/timing/severity/associated sxs/prior Treatment) HPI 2 nd visit for hives. Was seen and treated 2 days ago. Symptoms worse now. No respiratory issues.  Past Medical History:  Diagnosis Date  . Anxiety   . Depressed   . Kidney stone   . Migraine    Past Surgical History:  Procedure Laterality Date  . CESAREAN SECTION     History reviewed. No pertinent family history. Social History  Substance Use Topics  . Smoking status: Never Smoker  . Smokeless tobacco: Never Used  . Alcohol use Not on file   OB History    No data available     Review of Systems  Denies: HEADACHE, NAUSEA, ABDOMINAL PAIN, CHEST PAIN, CONGESTION, DYSURIA, SHORTNESS OF BREATH    Allergies  Sulfa antibiotics  Home Medications   Prior to Admission medications   Medication Sig Start Date End Date Taking? Authorizing Provider  hydrOXYzine (ATARAX/VISTARIL) 25 MG tablet Take 1 tablet (25 mg total) by mouth every 6 (six) hours. Prn hives and itching. 12/31/15  Yes Linna HoffJames D Kindl, MD  norethindrone-ethinyl estradiol (MICROGESTIN FE 1/20) 1-20 MG-MCG tablet Take 1 tablet by mouth daily.   Yes Historical Provider, MD  ibuprofen (ADVIL,MOTRIN) 200 MG tablet Take 400 mg by mouth every 6 (six) hours as needed for fever, headache, mild pain, moderate pain or cramping.    Historical Provider, MD  naproxen (NAPROSYN) 375 MG tablet Take 1 tablet (375 mg total) by mouth 2 (two) times daily. 07/07/15   Samantha Tripp Dowless, PA-C  PARoxetine (PAXIL) 30 MG tablet Take 30 mg by mouth daily.    Historical Provider, MD  predniSONE (DELTASONE) 10 MG tablet Sig: 4 tables once a day for 3 days, 3 tablets once a day X3 days, 2 tablets a day for 3 days, 1 tablet a day for 3 days. 01/02/16   Tharon AquasFrank C Sameka Bagent, PA    ranitidine (ZANTAC) 300 MG tablet Take 1 tablet (300 mg total) by mouth at bedtime. 01/02/16   Tharon AquasFrank C Killian Schwer, PA   Meds Ordered and Administered this Visit   Medications  methylPREDNISolone acetate (DEPO-MEDROL) injection 80 mg (80 mg Intramuscular Given 01/02/16 1056)    BP 144/97 (BP Location: Left Arm)   Pulse 92   Temp 98.1 F (36.7 C) (Oral)   Resp 12   LMP 12/26/2015 (Exact Date)   SpO2 100%  No data found.   Physical Exam NURSES NOTES AND VITAL SIGNS REVIEWED. CONSTITUTIONAL: Well developed, well nourished, no acute distress HEENT: normocephalic, atraumatic EYES: Conjunctiva normal NECK:normal ROM, supple, no adenopathy PULMONARY:No respiratory distress, normal effort ABDOMINAL: Soft, ND, NT BS+, No CVAT MUSCULOSKELETAL: Normal ROM of all extremities,  SKIN: warm and dry with diffuse areas of urticaria.  PSYCHIATRIC: Mood and affect, behavior are normal  Urgent Care Course   Clinical Course   80 mg IM steroids, tapering dose of prednisone, zantac 300 mg daily for 30 days.  May need review by allergist is symptoms persist.  Procedures (including critical care time)  Labs Review Labs Reviewed - No data to display  Imaging Review No results found.   Visual Acuity Review  Right Eye Distance:   Left Eye Distance:   Bilateral Distance:    Right Eye Near:   Left Eye Near:  Bilateral Near:         MDM   1. Rash and nonspecific skin eruption   2. Urticaria     Patient is reassured that there are no issues that require transfer to higher level of care at this time or additional tests. Patient is advised to continue home symptomatic treatment. Patient is advised that if there are new or worsening symptoms to attend the emergency department, contact primary care provider, or return to UC. Instructions of care provided discharged home in stable condition.    THIS NOTE WAS GENERATED USING A VOICE RECOGNITION SOFTWARE PROGRAM. ALL REASONABLE  EFFORTS  WERE MADE TO PROOFREAD THIS DOCUMENT FOR ACCURACY.  I have verbally reviewed the discharge instructions with the patient. A printed AVS was given to the patient.  All questions were answered prior to discharge.      Tharon Aquas, PA 01/02/16 1124

## 2016-01-02 NOTE — Telephone Encounter (Signed)
Attempt made to contact pt to verify she had transportation to appointments on Monday.  No answer, no message left as female voice on voice mail.

## 2016-01-02 NOTE — Telephone Encounter (Signed)
Pt states that she is ready to make appointments,  She reports that she has been homeless, but now is moving into a new house .  She accepts an appointment for in office NPI for Monday, 01/05/16 at 10:00 and U/S for dating at 11:00am.   Pt sates that she is going to arrange a ride to clinic via medicab.  Writer verified with pt that Monday appointment was not too soon, and pt states that she is "certain they will bring me."  Writer asks pt to call medicab immediately and notify [facility] nurse if she does not have transportation on Monday.  Appointments can be rescheduled to later in the week, if necessary.   Pt will need  NOB scheduled .

## 2016-01-03 LAB — CULTURE, GROUP A STREP (THRC)

## 2016-01-05 ENCOUNTER — Ambulatory Visit: Payer: Self-pay

## 2016-01-05 ENCOUNTER — Other Ambulatory Visit: Payer: Self-pay | Admitting: Obstetrics and Gynecology

## 2016-01-05 ENCOUNTER — Other Ambulatory Visit: Payer: Self-pay

## 2016-01-05 VITALS — BP 130/78 | Ht 65.0 in | Wt 292.0 lb

## 2016-01-05 DIAGNOSIS — O0992 Supervision of high risk pregnancy, unspecified, second trimester: Secondary | ICD-10-CM

## 2016-01-05 DIAGNOSIS — Z3490 Encounter for supervision of normal pregnancy, unspecified, unspecified trimester: Secondary | ICD-10-CM
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
## 2016-01-08 ENCOUNTER — Telehealth: Payer: Self-pay

## 2016-01-08 ENCOUNTER — Ambulatory Visit: Payer: Self-pay

## 2016-01-08 NOTE — Telephone Encounter (Signed)
Writer spoke with pt and pt was told of writer's mistake.   [facility].   Pt verbalized appointment date and time.

## 2016-01-08 NOTE — Telephone Encounter (Signed)
Telephone call to pt as she NOS [facility] appointment.  Pt states that she forgot about it.  Pt accepts appointment for 9;00am on 01/13/16 and then " had to go ".   When booking appointment into system writer realized that she had made a mistake and no appointment for 9:00 was available.    Writer returned call immediately, left message with female who answered the phone stating that writer would return call to pt in 15 minutes.     Appointment was booked for 01/13/16 at 3:30pm.  Will need to verify this with pt

## 2016-01-13 ENCOUNTER — Ambulatory Visit: Payer: Self-pay | Admitting: Obstetrics & Gynecology

## 2016-01-13 ENCOUNTER — Telehealth: Payer: Self-pay

## 2016-01-13 ENCOUNTER — Encounter: Payer: Self-pay | Admitting: Obstetrics & Gynecology

## 2016-01-13 NOTE — Telephone Encounter (Signed)
Patient called to reschedule scc appt. Patient states that there is a form that needs to be sent in order for patient to get medical cabs. Requesting call back from nurse.

## 2016-01-13 NOTE — Telephone Encounter (Signed)
Per Jeni SallesKim Hober, pt was informed that [facility] to discuss this with provider and SW.  Pt was given the telephone number for her to receive bus passes. She was told that she needed to call at least 5 days in advance of an appointment, and [facility] would send her passes in the mail.  Pt states that she will do this  Now.

## 2016-01-21 ENCOUNTER — Telehealth: Payer: Self-pay

## 2016-01-21 NOTE — Progress Notes (Signed)
Social Work Note -  SW assisted with one-time cab for [facility] tomorrow (that was scheduled today) as pt reported that she had no money to pay for transportation to appt/no time [place].  All around taxi to pick up pt at [address] [place] [phone] at 2:15pm.  Pt aware of arrangements.  SW planning to meet with pt at this appt for further assessment of needs.    Elissa HeftySarah Swan Zayed, LMSW [phone]

## 2016-01-21 NOTE — Telephone Encounter (Signed)
Patient is [redacted] weeks gestation, [facility], but is to be followed by high risk clinic for prenatal care due to mental health concerns, recent homelessness, and chronic hypertension.     Today patient is calling stating that she has a flare of Hydradenitis with boils on her groin and perineum. She is requesting a prescription for Clindamycin to treat the boils. Advised patient that she has not yet been seen for NOB in our clinic and that antibiotics are not typically prescribed over the phone. Offered office visit tomorrow, which she accepted . Elissa HeftySarah Gallivan was able to help arrange for transportation for the patient to tomorrow's office visit.

## 2016-01-21 NOTE — Telephone Encounter (Signed)
Patient would like some prescription refilled patient would like a nurse to call her back.

## 2016-01-22 ENCOUNTER — Ambulatory Visit: Payer: Self-pay

## 2016-01-24 ENCOUNTER — Emergency Department
Admission: EM | Admit: 2016-01-24 | Disposition: A | Discharge status: Home / Self Care | Payer: Self-pay | Source: Ambulatory Visit

## 2016-01-24 ENCOUNTER — Observation Stay
Admission: AD | Admit: 2016-01-24 | Disposition: A | Discharge status: Home / Self Care | Payer: Self-pay | Source: Ambulatory Visit | Attending: Obstetrics and Gynecology | Admitting: Obstetrics and Gynecology

## 2016-01-24 MED ORDER — ACETAMINOPHEN 325 MG PO TABS *I*
325.0000 mg | ORAL_TABLET | ORAL | 0 refills | Status: DC | PRN
Start: 2016-01-24 — End: 2016-02-02

## 2016-01-24 MED ORDER — CLINDAMYCIN PHOSPHATE 1 % EX SOLN *I*
Freq: Two times a day (BID) | CUTANEOUS | 0 refills | Status: DC
Start: 2016-01-24 — End: 2016-02-12

## 2016-01-24 MED ORDER — OXYCODONE HCL 5 MG PO TABS *I*
10.0000 mg | ORAL_TABLET | Freq: Once | ORAL | Status: AC
Start: 2016-01-24 — End: 2016-01-24
  Administered 2016-01-24: 10 mg via ORAL
  Filled 2016-01-24: qty 2

## 2016-01-24 MED ORDER — ACETAMINOPHEN 325 MG PO TABS *I*
325.0000 mg | ORAL_TABLET | ORAL | 0 refills | Status: DC | PRN
Start: 2016-01-24 — End: 2016-01-24

## 2016-01-24 MED ORDER — CLINDAMYCIN PHOSPHATE 1 % EX SOLN *I*
Freq: Two times a day (BID) | CUTANEOUS | 0 refills | Status: DC
Start: 2016-01-24 — End: 2016-01-24

## 2016-01-24 NOTE — Discharge Instructions (Signed)
Reason for Triage Visit: Hydradinitis   Destination: Home  Mode: Ambulatory    Procedures done in triage: Fetal monitoring  Pending Laboratory Data: None    Diagnosis:   There are no hospital problems to display for this patient.       Diet: Regular    Activity: Regular activity    Special Instructions: Call or return to triage if you have worsening of any of your symptoms, if you have more than 6 contractions in an hour, if you have vaginal bleeding or leakage of clear fluid, if you have headache or vision changes, if you have right-sided pain, or if you don't feel the baby moving.      If you cannot reach your OB/Gyn or Midwife, call the Labor and Delivery Unit or 911 if it is an emergency.    [facility] as previously instructed.

## 2016-01-24 NOTE — OB Triage Note (Signed)
OBSTETRICS TRIAGE NOTE      Chief Complaint: Pain    HPI     Jenna [name] is a 30 y.o. [id number] at 6570w0d with pregnancy complicated by risks outlined below who presents with pain from her hydradenitis flare. Patient in severe pain in axilla, buttocks and vagina. Has been using topical clindamycin, which she states has not been working. Also notes that she is currently out of the medication. Has seen several providers in the past. Saw a surgeon who told the patient they will not be able to do an un-roofing procedure until after she delivers.     Patient has known psychiatric history and was recently admitted to [facility] for suicidal ideation. Denies current SI or HI. States mood is well overall. Is not taking medication.     Denies leakage of fluid, vaginal bleeding, or contractions. Endorses good fetal movement.         Pregnancy Risks     Homelessness    Substance Abuse   - Cocaine and tobacco this pregnancy    Hx of Transactional Sex     Depression     Hydradenitis   - on clindamycin     Obesity    Hx of prior C-section x3    Asthma       Obstetrical History     OB History   Gravida Para Term Preterm AB Living   7 5 5  1 4    SAB TAB Ectopic Multiple Live Births    1   5      # Outcome Date GA Lbr Len/2nd Weight Sex Delivery Anes PTL Lv   7 Current            6 Term 10/27/12 [redacted]w[redacted]d  3700 g (8 lb 2.5 oz) M CSLT Gen  LIV   5 Term 11/26/09 286w0d  3549 g (7 lb 13.2 oz) F CS-LTranv Spinal N LIV      Birth Comments: Repeat C/S, HTN   4 Term 08/27/07 6036w0d 07:00 4082 g (9 lb) M CS-LTranv EPI  LIV      Birth Comments: FTP, HTN   3 Term 06/23/06 7814w0d 04:00 3714 g (8 lb 3 oz) M Vag-Vacuum   DEC      Birth Comments: fetal distress, SIDS death at 5months, HTN   2 Term 07/04/03 2314w0d 02:00 3260 g (7 lb 3 oz) M Vag-Spont EPI  LIV      Birth Comments: preeclampsia   1 TAB               Obstetric Comments   Cystic Fibrosis screen, 08/12/09:  No mutation   Hemoglobin electrophoresis, 03/27/12:  Normal         PMH, PSH, SH, GYN History,  Allergies, and Current Medications reviewed and updated in eRecord.       Review of Systems     Constitutional: Negative for fever or chills.  Cardiovascular/Respiratory: Denies chest pain or shortness of breath.  Gastrointestinal: Denies nausea/vomiting or abdominal pain.   Musculoskeletal: Denies muscle weakness.  Neurologic: Denies headaches or vision changes.   Genitourinary: Denies dysuria or hematuria.  Denies vaginal bleeding.  Denies leakage of fluid.        Physical Exam     Vitals:    01/24/16 1615   BP: 114/54   Pulse: 80   Resp: 18   Temp: 36.5 C (97.7 F)   TempSrc: Temporal       General Appearance: active, no distress and cooperative  Mental Status: Alert and oriented x 3  HEENT: Normocephalic, atraumatic.  Cardiovascular: Regular rate  Respiratory: Unlabored work of breathing  Abdomen: Soft, gravid, non-tender.      Neurological: Grossly intact  Extremities: Several large pustules under axilla and vaginal, single large vesicle draining in left vaginal region.     Fetal Monitoring:  Doptone: 150 bpm      Assessment & Plan     Jenna Maxinshley Rosko is a 30 y.o. [id number] at 422w0d with pregnancy complicated by risks outlined above who presents with pain from Hydradenitis lesions. Patient was given single dose of oxy ir to relive pain. Given history was not prescribed narcotic medication. General surgery was consulted on patient to give recommendations on treatment/surgical options, however patient stated that she could not wait for the consult to come.    Hydradenitis suppurativa   - Several lesions, likely benefit from unroofing procedure  - New prescription given for topical clindamycin  - Tylenol prescription given for pain, patient instructed to limit tylenol usage to less than 2.5 g to prevent liver damage.     Prenatal Care  - Medicab paperwork given to patient   - Stressed importance of [facility]    D/w Dr. Vedia CofferBlack and Dr. Finis BudHollenbach     Harshika Mago, MD  OB/GYN Resident,  R2  Pager # [phone]

## 2016-01-25 ENCOUNTER — Observation Stay: Admission: AD | Admit: 2016-01-25 | Payer: Self-pay | Source: Ambulatory Visit | Admitting: Obstetrics and Gynecology

## 2016-01-26 ENCOUNTER — Encounter: Payer: Self-pay | Admitting: Obstetrics and Gynecology

## 2016-01-27 ENCOUNTER — Telehealth: Payer: Self-pay

## 2016-01-27 ENCOUNTER — Ambulatory Visit: Payer: Self-pay | Admitting: Obstetrics and Gynecology

## 2016-01-27 NOTE — Telephone Encounter (Signed)
SCC RN notified by SW that a voice mail was received from this patient within the last 30 minutes. [facility]. The message from Jenna [name] indicated she needed help with transportation to today's appointment and that she had swelling and could hardly walk. SW asks that nurse contact patient to assess her symptoms and offer advise for evaluation. Attempted to reach patient and voice mail left.

## 2016-01-27 NOTE — Telephone Encounter (Signed)
Will also mail letter asking that patient call office and reschedule NOB.

## 2016-01-28 ENCOUNTER — Ambulatory Visit (HOSPITAL_COMMUNITY)
Admission: EM | Admit: 2016-01-28 | Discharge: 2016-01-28 | Disposition: A | Discharge status: Home / Self Care | Payer: Medicaid Other | Attending: Emergency Medicine | Admitting: Emergency Medicine

## 2016-01-28 ENCOUNTER — Encounter (HOSPITAL_COMMUNITY): Payer: Self-pay | Admitting: Family Medicine

## 2016-01-28 DIAGNOSIS — L509 Urticaria, unspecified: Secondary | ICD-10-CM | POA: Diagnosis not present

## 2016-01-28 DIAGNOSIS — J0100 Acute maxillary sinusitis, unspecified: Secondary | ICD-10-CM | POA: Diagnosis not present

## 2016-01-28 DIAGNOSIS — L2082 Flexural eczema: Secondary | ICD-10-CM

## 2016-01-28 MED ORDER — PROMETHAZINE HCL 25 MG PO TABS: 25.0000 mg | ORAL_TABLET | Freq: Every day | ORAL | 0 refills | Status: DC

## 2016-01-28 MED ORDER — AMOXICILLIN-POT CLAVULANATE 875-125 MG PO TABS: 1.0000 | ORAL_TABLET | Freq: Two times a day (BID) | ORAL | 0 refills | Status: DC

## 2016-01-28 MED ORDER — TRIAMCINOLONE ACETONIDE 0.5 % EX OINT: 1.0000 "application " | TOPICAL_OINTMENT | Freq: Two times a day (BID) | CUTANEOUS | 0 refills | Status: DC

## 2016-01-28 NOTE — ED Triage Notes (Signed)
Pt here with continued rash that started over a week ago that comes at night. sts she was seen here and treated and meds aren't working. sts also now she has nasal congestion and sinus pressure and pain.

## 2016-01-28 NOTE — ED Provider Notes (Signed)
[facility]    CSN: [id number] Arrival date & time: 01/28/16  1147     History   Chief Complaint Chief Complaint  Patient presents with  . Rash  . Nasal Congestion  . Ear Pain    HPI Samantha Nguyen is a 30 y.o. female.   HPI  She is a 30 year old woman here for sinus issues and a rash. She states over the last 4-5 days she has had nasal congestion, pressure in the right maxillary sinus, right ear stuffiness, and cough. No sore throat. Reports subjective fevers, but no documented temperature. No nausea or vomiting.  She also reports continued hives. They only show up at night and resolved when she wakes up. She has been on prednisone, Zantac, Zyrtec, and Atarax without improvement. She has changed the sheets, changed detergents without improvement. She has a friend who has similar symptoms who is been treated successfully with Phenergan at bedtime.  She also has an itchy scaly rash in her elbows and behind her knees. She does have a history of eczema as a child.  Past Medical History:  Diagnosis Date  . Anxiety   . Depressed   . Kidney stone   . Migraine     There are no active problems to display for this patient.   Past Surgical History:  Procedure Laterality Date  . CESAREAN SECTION      OB History    No data available       Home Medications    Prior to Admission medications   Medication Sig Start Date End Date Taking? Authorizing Provider  amoxicillin-clavulanate (AUGMENTIN) 875-125 MG tablet Take 1 tablet by mouth 2 (two) times daily. 01/28/16   Charm RingsErin J Alexarae Oliva, MD  ibuprofen (ADVIL,MOTRIN) 200 MG tablet Take 400 mg by mouth every 6 (six) hours as needed for fever, headache, mild pain, moderate pain or cramping.    Historical Provider, MD  naproxen (NAPROSYN) 375 MG tablet Take 1 tablet (375 mg total) by mouth 2 (two) times daily. 07/07/15   Samantha Tripp Dowless, PA-C  norethindrone-ethinyl estradiol (MICROGESTIN FE 1/20) 1-20 MG-MCG tablet Take 1  tablet by mouth daily.    Historical Provider, MD  PARoxetine (PAXIL) 30 MG tablet Take 30 mg by mouth daily.    Historical Provider, MD  promethazine (PHENERGAN) 25 MG tablet Take 1 tablet (25 mg total) by mouth at bedtime. 01/28/16   Charm RingsErin J Blade Scheff, MD  triamcinolone ointment (KENALOG) 0.5 % Apply 1 application topically 2 (two) times daily. 01/28/16   Charm RingsErin J Forestine Macho, MD    Family History History reviewed. No pertinent family history.  Social History Social History  Substance Use Topics  . Smoking status: Never Smoker  . Smokeless tobacco: Never Used  . Alcohol use Not on file     Allergies   Sulfa antibiotics   Review of Systems Review of Systems As in history of present illness  Physical Exam Triage Vital Signs ED Triage Vitals  Enc Vitals Group     BP 01/28/16 1225 129/75     Pulse Rate 01/28/16 1225 103     Resp --      Temp 01/28/16 1225 98.7 F (37.1 C)     Temp src --      SpO2 01/28/16 1225 100 %     Weight --      Height --      Head Circumference --      Peak Flow --      Pain Score  01/28/16 1226 5     Pain Loc --      Pain Edu? --      Excl. in GC? --    No data found.   Updated Vital Signs BP 129/75   Pulse 103   Temp 98.7 F (37.1 C)   LMP 12/26/2015 (Exact Date)   SpO2 100%   Visual Acuity Right Eye Distance:   Left Eye Distance:   Bilateral Distance:    Right Eye Near:   Left Eye Near:    Bilateral Near:     Physical Exam  Constitutional: She is oriented to person, place, and time. She appears well-developed and well-nourished. No distress.  HENT:  Mouth/Throat: No oropharyngeal exudate.  Right TM is retracted. Left TM is normal. Nasal mucosa is quite edematous. Tender over the right maxillary sinus. Oropharynx with mild erythema.  Neck: Neck supple.  Cardiovascular: Normal rate, regular rhythm and normal heart sounds.   No murmur heard. Pulmonary/Chest: Effort normal and breath sounds normal. No respiratory distress. She has no  wheezes. She has no rales.  Lymphadenopathy:    She has no cervical adenopathy.  Neurological: She is alert and oriented to person, place, and time.  Skin: Skin is warm and dry. Rash (eczematous rash in flexural elbows) noted.     UC Treatments / Results  Labs (all labs ordered are listed, but only abnormal results are displayed) Labs Reviewed - No data to display  EKG  EKG Interpretation None       Radiology No results found.  Procedures Procedures (including critical care time)  Medications Ordered in UC Medications - No data to display   Initial Impression / Assessment and Plan / UC Course  I have reviewed the triage vital signs and the nursing notes.  Pertinent labs & imaging results that were available during my care of the patient were reviewed by me and considered in my medical decision making (see chart for details).  Clinical Course     Trial of Phenergan for recurring hives. Triamcinolone for eczema. Augmentin for sinus infection. Referral to allergy placed.  Final Clinical Impressions(s) / UC Diagnoses   Final diagnoses:  Urticaria  Flexural eczema  Acute non-recurrent maxillary sinusitis    New Prescriptions New Prescriptions   AMOXICILLIN-CLAVULANATE (AUGMENTIN) 875-125 MG TABLET    Take 1 tablet by mouth 2 (two) times daily.   PROMETHAZINE (PHENERGAN) 25 MG TABLET    Take 1 tablet (25 mg total) by mouth at bedtime.   TRIAMCINOLONE OINTMENT (KENALOG) 0.5 %    Apply 1 application topically 2 (two) times daily.     Charm RingsErin J Chery Giusto, MD 01/28/16 [phone]

## 2016-01-28 NOTE — Discharge Instructions (Signed)
I'm not sure about the recurring hives. I provided a prescription to try the Phenergan at night. Use the triamcinolone on your elbows and knees as needed. Take Augmentin twice a day for 10 days for a sinus infection. I did put in a referral to see an allergist. Follow-up as needed.

## 2016-01-30 NOTE — Progress Notes (Signed)
SW Note:  SW called [facility] and [facility]. Patient will be picked up at 1:45 for 2:45 appointment by [facility]. Called patient to confirm.  Mylie Mccurley m. CraigHober, LMSW, [phone], beeper [phone]

## 2016-02-02 ENCOUNTER — Telehealth: Payer: Self-pay

## 2016-02-02 ENCOUNTER — Telehealth: Payer: Self-pay | Admitting: Obstetrics & Gynecology

## 2016-02-02 MED ORDER — ACETAMINOPHEN 500 MG PO TABS *I*
500.0000 mg | ORAL_TABLET | ORAL | 2 refills | Status: DC | PRN
Start: 2016-02-02 — End: 2016-06-02

## 2016-02-02 NOTE — Telephone Encounter (Signed)
Refilled tylenol prescription.      Shelton SilvasLaura Inita Uram MD  OBGYN R4  [phone]

## 2016-02-02 NOTE — Telephone Encounter (Signed)
Patient would like to talk with a nurse regarding her pain medication patient would like a nurse to call her.

## 2016-02-02 NOTE — Telephone Encounter (Addendum)
Call returned to patient - Dr. Lenard SimmerPekman has agreed to refill the Tylenol in a greater strength for patient.     Patient asking about a refill for antibiotics as well, for the "boils" she is experiencing. She is very distressed about the boils. Discussed with her that antibiotics are not refilled over the phone, and that she needs to [facility] and a good plan to take care of this issue effectively for her. She agrees to plan and confirms her plan to [facility].     Patient also reports pain at the site of her c-section site. She has had four c-sections, the last one over three years ago. The pain is sharp, and intermittent. It occurs when she coughs or sneezes. It was an 8/10 yesterday but better today at a level of 6/10. No vaginal bleeding or other symptoms. Discussed with MFM fellow who suggests it could be pain of the scar tissue stretching. Patient may monitor and utilize home comfort measures. However, [facility] or [facility]. Patient verbalized understanding and agreed to plan.

## 2016-02-02 NOTE — Telephone Encounter (Signed)
Patient is calling back in tears because she is in so much pain and no one has called her back and would like to know if the Dr. got her message    Per on call schedule Dr. Otila BackGevelinger is on from 6p-6am/pager #[phone]/smh

## 2016-02-02 NOTE — Telephone Encounter (Signed)
Patient has been prescribed Tylenol 325 mg, 1 tablet every 4-6 hours for pain associated with Hydradenitis Flare. She states she has used all of the Tylenol and feels she would like to use a stronger dose. She is calling today asking for a prescription, pharmacy in e-record confirmed.     Agreed to forward her request to prescriber and to call her back with their response.

## 2016-02-03 ENCOUNTER — Ambulatory Visit: Payer: Self-pay | Admitting: Obstetrics & Gynecology

## 2016-02-03 ENCOUNTER — Telehealth: Payer: Self-pay

## 2016-02-03 ENCOUNTER — Encounter: Payer: Self-pay | Admitting: Obstetrics & Gynecology
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
## 2016-02-03 NOTE — Telephone Encounter (Signed)
Call made to patient - she agreed to reschedule her NOB in [facility] to next week, 02/12/16. She also has an ultrasound ordered, and would like to arrange for that the same day. I agreed to forward her message to ultrasound scheduler to assist her with this.     I also agreed to forward her transportation concerns to SW who have agreed to call patient tomorrow to follow up.

## 2016-02-04 NOTE — Telephone Encounter (Signed)
Contacted [facility] about pt's cab.  Cab was sent to wrong address.  Address updated & cab scheduled for pt's appt next week.  [facility] to pick up pt at ~2:30pm on 11/30.  Pt contacted and given update.    Elissa HeftySarah Yechezkel Fertig, LMSW [phone]

## 2016-02-06 NOTE — Progress Notes (Unsigned)
This encounter was created in error - please disregard.

## 2016-02-12 ENCOUNTER — Ambulatory Visit: Payer: Self-pay | Admitting: Obstetrics & Gynecology

## 2016-02-12 ENCOUNTER — Encounter: Payer: Self-pay | Admitting: Psychiatry

## 2016-02-12 ENCOUNTER — Telehealth: Payer: Self-pay

## 2016-02-12 ENCOUNTER — Encounter: Payer: Self-pay | Admitting: Obstetrics & Gynecology

## 2016-02-12 ENCOUNTER — Other Ambulatory Visit: Payer: Self-pay

## 2016-02-12 VITALS — BP 127/72 | HR 86 | Ht 65.0 in | Wt 305.0 lb

## 2016-02-12 DIAGNOSIS — F32A Depression, unspecified: Secondary | ICD-10-CM

## 2016-02-12 DIAGNOSIS — E669 Obesity, unspecified: Secondary | ICD-10-CM

## 2016-02-12 DIAGNOSIS — F1411 Cocaine abuse, in remission: Secondary | ICD-10-CM

## 2016-02-12 DIAGNOSIS — O0970 Supervision of high risk pregnancy due to social problems, unspecified trimester: Secondary | ICD-10-CM

## 2016-02-12 DIAGNOSIS — O0990 Supervision of high risk pregnancy, unspecified, unspecified trimester: Secondary | ICD-10-CM

## 2016-02-12 DIAGNOSIS — Z6841 Body Mass Index (BMI) 40.0 and over, adult: Secondary | ICD-10-CM

## 2016-02-12 DIAGNOSIS — J45909 Unspecified asthma, uncomplicated: Secondary | ICD-10-CM

## 2016-02-12 DIAGNOSIS — L732 Hidradenitis suppurativa: Secondary | ICD-10-CM

## 2016-02-12 LAB — POCT URINALYSIS DIPSTICK
Blood,UA POCT: NEGATIVE
Glucose,UA POCT: NORMAL
Ketones,UA POCT: NEGATIVE
Leuk Esterase,UA POCT: NEGATIVE
Lot #: 22255401
Nitrite,UA POCT: NEGATIVE
PH,UA POCT: 7 (ref 5–8)
Protein,UA POCT: NEGATIVE mg/dL

## 2016-02-12 LAB — CREATININE, URINE: Creatinine,UR: 141 mg/dL (ref 20–300)

## 2016-02-12 LAB — PROTEIN, URINE: Protein,UR: 7 mg/dL (ref 0–11)

## 2016-02-12 LAB — TP/CREATININE RATIO,UR: TP Creatinine ratio,UR: 0.05

## 2016-02-12 MED ORDER — QUETIAPINE FUMARATE 25 MG PO TABS *I*
25.0000 mg | ORAL_TABLET | Freq: Three times a day (TID) | ORAL | 0 refills | Status: DC
Start: 2016-02-12 — End: 2016-06-24

## 2016-02-12 MED ORDER — CLINDAMYCIN PHOSPHATE 1 % EX SOLN *I*
Freq: Two times a day (BID) | CUTANEOUS | 1 refills | Status: DC
Start: 2016-02-12 — End: 2016-03-25

## 2016-02-12 MED ORDER — SERTRALINE HCL 100 MG PO TABS *I*
50.0000 mg | ORAL_TABLET | Freq: Every day | ORAL | 0 refills | Status: DC
Start: 2016-02-12 — End: 2016-06-24

## 2016-02-12 MED ORDER — TRAZODONE HCL 100 MG PO TABS *I*
100.0000 mg | ORAL_TABLET | Freq: Every evening | ORAL | 0 refills | Status: DC
Start: 2016-02-12 — End: 2016-06-24

## 2016-02-12 MED ORDER — CHLORHEXIDINE GLUCONATE 4 % EX LIQD *WRAPPED*
Freq: Every day | CUTANEOUS | 1 refills | Status: DC
Start: 2016-02-12 — End: 2016-07-08
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
## 2016-02-12 NOTE — Patient Instructions (Signed)
Common Discomforts  You have made it through the first third of your pregnancy! The middle 3 months--the second trimester--are usually the easiest. Although some women still have nausea and feel especially tired for their whole pregnancy, most women are feeling much better.  Below are some tips for dealing with common discomforts in the second trimester.    Back Pain  Lower back pain can be caused by muscle strain (from stretching, bending or lifting).  As your uterus grows, it can put more strain on your back.   Use proper body mechanics by lifting with your back straight and your knees bent.  Do not twist while lifting or pulling.  Stand and sit with your back as straight as possible.    Avoid standing too long in one spot. If you can, rest one foot on a footstool or step while you are standing.    Wear a special support girdle for pregnant women.   You can find these at maternity shops or a medical supply store.    Use a footstool when you have to sit for long periods, so your legs are no dangling.   Ask your partner or a friend for a back rub, or use a heating pad on low setting to relieve aches. Sometimes a warm, not hot, bath feels good too.   Try to sleep on a firm, supportive mattress. Put a board under your mattress if it is too soft.   Use lost of pillows!  Wedge one under your belly when you are on your side and use one between your knees while on your side too.  Avoid lying flat on your back.  Lean into a pillow placed under your side instead.   Do pelvic tilt exercises.  On your hands and knees like an angry cat.  Rock forward and backward a little to stretch out your back.   On your back, bend your knees, then lift your bottom up in the air so only your head, shoulders and feet touch the floor.  Hold this position for a few seconds, then relax and do it again.    Wear good walking shoes as much as possible.  Try not to wear shoes with a heel higher than 2 inches.  If your backache comes and  goes in a regular pattern or if you have leakage or bleeding from your vagina, call your health care provider right away.    Hemorrhoids  Hemorrhoids are extra-large blood vessels in your rectum (where you have bowel movements) that itch and sometimes bleed.  They are caused by the increased pressure of your baby in your pelvis.  The pressure of the baby blocks off the blood vessels and causes them to get too big.     Avoid constipation.  You should have a bowel movement daily.  You can use Colace (docusate) to help soften the stool for bowel movements.   Do not strain or push when having a bowel movement.  If you have to strain, you should start a stool softener.   Drink more fluids and eat foods high in fiber, like whole grains, fruits, and vegetables.   If you have a hemorrhoid, you can soothe the area by taking stiz baths with cool water 2-3 times a day. You can buy a Personnel officer at [employer] or medical supply store.    Use ice packs or witch hazel pads (like Tucks) to take the swelling down.    Try to sit with  your legs crossed tailor style (both feet tucked under the opposite leg, with the knees sticking out) or sit on hard chairs.  Do not sit too long in any chair.  • If you notice bleeding when you go to the bathroom, talk with your health care provider.     Leg Cramps  Leg cramps happen in pregnancy, but it is unclear why.  • Ask your health care provider if you can take a calcium supplement (like Tums).  There is calcium in your prenatal vitamin.  • Try light exercise like walking up to 30 minutes per day.  • If you get a cramp, try straightening your leg.  Sometimes pointing your toe toward your knee helps.  Stand a foot or so from a wall and lean toward it keeping your knees straight and your feet flat on the floor.  • Massage or apply mild heat with a warm wash cloth or heating pad on low to the cramping area.  If you feel a hard knot or hot spot on your leg, especially in the calf (back of  the lower leg that does not go away, call you health care provider.    Varicose Veins  Varicose veins show up as blue line under the skin usually on your lower legs.  Sometimes they are thick like cords.    Things you can do to help present varicose veins includes the following:  • Avoid constrictive (tight) clothing, especially if it is tight in your waist or hip areas.  Also avoid knee socks or hose with elastic bands at the top.  • Try not to sit or stand for long periods without moving around.   • Use good posture and good body mechanics.     If you get varicose veins, these things can help keep them from bothering you or getting worse:  • Wear good support hose.  You can buy support hose at the maternity shops or medical supplies stores.  Your doctor can give you a prescription.  • Raise your legs on a footstool when sitting.  Lie down and put your feet up higher than your head every chance you get.    Warning Signs  Please call your health care provider if any of the following occur.    Bleeding  Some light vaginal spotting in pregnancy is normal, especially if you recently had an exam or intercourse.  If you have bleeding that is bright red or heavy like a period, you should notify your provider.  They may have you come to the clinic or hospital to be evaluated for problems with your cervix or placenta.    Pain  Please notify your doctor if you experience severe abdominal pain or uterine cramping.  Your doctor may have you come to the clinic or hospital to make sure you are not having contractions.    Infection and Fever  When you are pregnant, you can have colds and infections similar to when you are not pregnant.  If you have a fever, you should increase the amount of fluids you drink.  You can take Tylenol (acetaminophen) for fever.  You should avoid over the counter cold medications unless instructed by your doctor.  You should notify your doctor if you develop pain with urination, vomiting and diarrhea  for more than one day, changes in your vaginal discharge, or any other symptoms that concern you.

## 2016-02-12 NOTE — Telephone Encounter (Signed)
Per today's LOS [facility] around [facility] (currently already scheduled for 12/28). Thank you!

## 2016-02-13 LAB — N. GONORRHOEAE DNA AMPLIFICATION: N. gonorrhoeae DNA Amplification: 0

## 2016-02-13 LAB — CHLAMYDIA PLASMID DNA AMPLIFICATION: Chlamydia Plasmid DNA Amplification: 0

## 2016-02-13 LAB — TRICHOMONAS DNA AMPLIFICATION: Trichomonas DNA amplification: 0

## 2016-02-13 NOTE — Telephone Encounter (Signed)
Appointment has been rescheduled for 02/26/16

## 2016-02-13 NOTE — Progress Notes (Signed)
Social Work Note -   I briefly met with pt as she was walking out of her [facility] appointment yesterday - as she mentioned to staff needing assistance with transportation home.  A Medicaid Cab had been arranged for pt's visit today, but she stated that she did not take it here as she had an appt earlier today at [facility] (on [address]), took the bus to this appt & walked to clinic from [facility].  Pt did say that she had been in contact with the cab company & told them not to pick her up.  [facility] contacted, [employer] arranged for pt's trip home.  A Medicaid Cab Authorization form was also completed at this visit - pt was informed that she should be able to set up her own cab now, given contact information for [facility].  At [place], pt asked about being referred to [facility] for perinatal support.  Upon further discussion, pt indicated that she needed to complete a parenting class (unclear who is mandating this).  Given the lateness of the day, a plan was made for me to contact pt by telephone today to further discuss her needs & involvement with other services (as she mentioned having a case Freight forwarder).  I attempted to contact pt 2x today - message left on her voicemail with SW's direct # for return call.    [name], [place]

## 2016-02-16 LAB — GYN CYTOLOGY

## 2016-02-17 ENCOUNTER — Other Ambulatory Visit: Payer: Self-pay | Admitting: Psychiatry

## 2016-02-17 DIAGNOSIS — F32A Depression, unspecified: Secondary | ICD-10-CM

## 2016-02-20 ENCOUNTER — Ambulatory Visit: Payer: Self-pay | Admitting: Surgery

## 2016-02-23 ENCOUNTER — Other Ambulatory Visit: Payer: Self-pay | Admitting: Psychiatry

## 2016-02-23 ENCOUNTER — Telehealth: Payer: Self-pay

## 2016-02-23 DIAGNOSIS — L732 Hidradenitis suppurativa: Secondary | ICD-10-CM

## 2016-02-23 NOTE — Progress Notes (Signed)
Error in opening new encounter

## 2016-02-23 NOTE — Telephone Encounter (Signed)
[facility] - Ambulatory Telephone Intake Screen     Patient Information:    Patient's Name: Jenna [name]  Patient's Date of Birth: 07/20/1985  Patient's Medical Record Number: [id number]  Patient's Home Phone: [phone] (home)  Patient's Work Phone: [phone] (work)  United AutoPatient's Mobile Phone:   No relevant phone numbers on file.     Patient's Marital Status (if applicable): Single  Can a message be left? yes      Insurance Information:      Policy number: [employer]      Referral Source:  Referral Source: [employer] [employer] Lamb       Presenting Concern:     What is the reason you are seeking care?     (Please provide a brief description of the reasons the patient or referring provider is seeking mental health treatment at this time.  Please use patients own words where possible).  Doesn't have any concerns.                                    Mental Health History:    Are you currently involved in mental health treatment?  No       Are you currently taking a long acting injectable medication?  No    Will you need an injection at the time of your first intake assessment appointment?   no      Customer Service:    Do you need arrangements for?    Wheelchair: No    Interpreter Services:No       PT has a  scheduled [facility] intake with Janus MolderNatalie McLaren on 03/03/16  At 1:15 PM  financial fee appointment and  2:00 PM  Intake appointment..  PT advised that an appointment confirmation letter will be mailed out to her.

## 2016-02-25 ENCOUNTER — Telehealth: Payer: Self-pay

## 2016-02-25 NOTE — Telephone Encounter (Signed)
Telephone call to remind pt of blood work that needs to be completed and for reminder of appointment tomorrow.  Pt agrees to go to Lab for glucola between her U/S ( 1:00) and [facility] ( 2:30) appointments.

## 2016-02-26 ENCOUNTER — Ambulatory Visit: Payer: Self-pay

## 2016-02-26 ENCOUNTER — Telehealth: Payer: Self-pay

## 2016-02-26 ENCOUNTER — Encounter: Payer: Self-pay | Admitting: Obstetrics and Gynecology

## 2016-02-26 DIAGNOSIS — O0990 Supervision of high risk pregnancy, unspecified, unspecified trimester: Secondary | ICD-10-CM

## 2016-02-27 ENCOUNTER — Ambulatory Visit: Payer: Self-pay | Admitting: Surgery

## 2016-02-27 NOTE — Telephone Encounter (Signed)
Telephone call to pt as she NOS [facility] appointment.  She states she had to leave after her U/S because she got a call informing of an emergency with her son.   Pt accepts appointment for 03/02/16 at 8:30am.  This appointment was booked.

## 2016-03-02 ENCOUNTER — Encounter: Payer: Self-pay | Admitting: Obstetrics and Gynecology

## 2016-03-02 ENCOUNTER — Telehealth: Payer: Self-pay

## 2016-03-02 NOTE — Telephone Encounter (Signed)
[phone] ( [employer])  - call made to insurance company to inquire wether they have an outreach program for OB patients. They do have a case management program and they will assign the patient to case manager Ruthy DickAnn Marie Obrien. Maryruth Hancocknn Marie can be contacted through [employer] at [phone] ext [phone].     Attempted to reach patient again this afternoon - her line was not accepting incoming calls. Will send MyChart message and printed letter to her home asking her to call the office.

## 2016-03-02 NOTE — Telephone Encounter (Signed)
Spoke with Care manager Clayborn HeronLinda Jackson regarding this patient and missed appointments.     Plan: will contact [employer] to inquire wether they have an outreach program available for their OB patients.     Also: patient is scheduled with [facility] 03/03/16 at 1:15 pm. May be able to utilize their staff in assisting reaching patient if patient goes as scheduled.

## 2016-03-02 NOTE — Telephone Encounter (Signed)
Attempt made to contact pt as she NOS [facility] appointment.  " the person you are calling can not accept calls at this time."   Will leave open and attempt pt later in the day.  [facility] does not speak with pt today, will send a letter asking her to contact [facility] ASAP to schedule an appointment that is convenient for her.  Will also involve case managers from insurance , if appropriate.

## 2016-03-02 NOTE — Telephone Encounter (Addendum)
Writer received an e mail request from [employer] to schedule PT for an intake appointment.  Writer spoke with PT and scheduled the intake  with Therapist Janus MolderNatalie McLaren on 03/03/16 at 2:00 PM.

## 2016-03-03 ENCOUNTER — Telehealth: Payer: Self-pay

## 2016-03-03 NOTE — Progress Notes (Signed)
[facility] MISSED/CANCELLED APPOINTMENT     Name: Jenna [name]  MRN: [id number]   DOB: 09/24/1985    Date of Scheduled Service: 03/03/16    Ms. Jenna [name] was a no show for today's appointment.  I have sent the patient a letter regarding today's missed appointment.    Additional Information:    Not applicable

## 2016-03-03 NOTE — Telephone Encounter (Signed)
[phone]: voice mail left for [employer] Case manager.

## 2016-03-03 NOTE — Telephone Encounter (Signed)
Annmarie([phone]) from [employer] would like you to call her back regarding this patient.

## 2016-03-03 NOTE — Telephone Encounter (Signed)
Second attempt made to reach Maryruth HancockAnn Marie, but this time I was unable to leave message.

## 2016-03-04 NOTE — Telephone Encounter (Signed)
Jenna [name] returning call from Hayden RasmussenSarah Hyatt, RN.   Last known number they have for this pt is the number that is also in [employer].  Jenna [name] also contacted pt's pharmacy and the last number they have for pt is [phone].   Jenna [name] has attempted to reach patient at this number without success.  She will continue in her attempts and if successful, will encourage pt to contact [facility] ASAP for prenatal care.

## 2016-03-05 ENCOUNTER — Ambulatory Visit: Payer: Self-pay | Admitting: Surgery

## 2016-03-05 VITALS — BP 118/56 | HR 78 | Temp 97.8°F | Resp 18 | Ht 65.0 in | Wt 305.0 lb

## 2016-03-05 DIAGNOSIS — L732 Hidradenitis suppurativa: Secondary | ICD-10-CM
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
## 2016-03-09 NOTE — Telephone Encounter (Signed)
Patient is scheduled for an ultrasound 12/28 - ultrasound [facility] staff messaged to add comments to IDX appointment so patient may be directed [facility].

## 2016-03-11 ENCOUNTER — Other Ambulatory Visit: Payer: Self-pay

## 2016-03-11 NOTE — Telephone Encounter (Signed)
Call made to patient - [facility] She had already rescheduled ultrasound to next week as well.

## 2016-03-15 NOTE — L&D Delivery Note (Signed)
Delivery Summary Note  Patient: Jenna [name]  Age: 31 y.o.  Date of Birth: [birth year]  [id number]  MRN: [id number]    Admission Summary  Date and time of admission: 06/24/2016  4:34 PM Attending Provider: Scarlette Slice, MD Provider Group : [facility]  Active Hospital Problems    Diagnosis    Gestational HTN     Allergies:   Review of patient's allergies indicates no known allergies (drug, envir, food or latex).  Weight:    Weight: (!) 142.4 kg (314 lb)     Obstetric History    G7   P6   T6   P0   A1   L5     SAB0   TAB1   Ectopic0   Multiple0   Live Births6    Obstetric Comments   Cystic Fibrosis screen, 08/12/09:  No mutation   Hemoglobin electrophoresis, 03/27/12:  Normal             Prenatal Labs  ABO RH Blood Type   Date Value Ref Range Status   06/24/2016 A RH POS  Final     Antibody Screen   Date Value Ref Range Status   06/24/2016 Negative  Final     Rubella IgG AB   Date Value Ref Range Status   03/25/2016 POSITIVE  Final     Comment:     TEST METHOD: Multiplex flow immunoassay     RPR Screen   Date Value Ref Range Status   11/04/2009 NONREACT NONREACT Final     Comment:     TEST METHOD: Charcoal Particle Agglutination     HBV S Ag   Date Value Ref Range Status   03/25/2016 NEG  Final     Comment:     Test Method: CMIA     Group B Strep Culture   Date Value Ref Range Status   10/10/2012 Streptococcus agalactiae (Group B) detected  Final     Comment:     Organism identified from broth culture by amplification.     HIV 1&2 ANTIGEN/ANTIBODY   Date Value Ref Range Status   03/25/2016 Nonreactive  Final     Comment:     Test Method: CMIA     HIV 1&2 Ab screen   Date Value Ref Range Status   11/04/2009 NEG  Final     Comment:     TEST METHOD: EIA     HM HIV SCREENING OFFERED   Date Value Ref Range Status   08/10/2014 Previously Done  Final      Dating Information  Patient's last menstrual period was 09/13/2015 (within weeks). EDD: 06/26/2016, by Ultrasound  Information for the patient's newborn:  Jaylynn, Mcaleer Girl [[id number]]      Delivery Information  Girl Devlin  Sex: female Gestational Age: [redacted]w[redacted]d MRN: [id number] PCP: No primary care provider on file.   Delivery Date/Time: 06/24/2016 8:02 PM   Time of Head Delivery: 06/24/2016  8:02 PM  Delivery Type: C-Sec, T/J Extention        Meconium at time of delivery: none  Delivery Location: 314 OR    Labor Onset Date/Time:     Dilation Complete Date/Time:         Preterm labor: No Antenatal steroids: None Antibiotics received during labor:        First Cervical ripening date/time:   /   Cervical ripening Type:          Rupture Date: 06/24/2016 Rupture Time: 8:00 PM  Details:   Rupture  Type: Artificial Color: Clear Amount: Large   Induction: .none           Augmentation: .None      Labor complications: None     Delivering clinician:  Arthur Holms ANN   Other personnel:   Provider Role   BARTZ, SARA MARIE Physician Assistant   COOK, MICHELLE Resident   PRICE, Lorel Monaco NICU/SCN Fellow   PALERMO, NATALIE NICU/SCN RN   Monia Sabal NICU/SCN RN   Lawrence Marseilles Delivery Nurse   Jefferey Pica LPN   Kathee Polite OB Resident              Anesthesia Method: General-   Analgesics:        Presentation: Vertex Position: Middle Occiput Anterior  Prophylactic Maneuver: No     Shoulder Dystocia: No                                               Vacuum:       Indications: Assistance at C/S     Vacuum type: Mushroom cup (mityvac/kiwi)        First attempt time vacuum applied:  8:01 PM     First attempt time vacuum removed:  8:01 PM       Second attempt time vacuum applied:  8:02 PM     Second attempt time vacuum removed:  8:02 PM        Number of pop offs: 1        Total vacuum application time: 1 minute       Vacuum applied by: Arthur Holms, MD     Failed No  Skin to Skin/Bonding:             Reason skin to skin not initiated: Infant condition at delivery [102]   Resuscitation: Dry;Tactile Stimulation;Bulb Suctioning;Oxygen;PPV    Living Status: Living           APGARs Total Color Reflex irritability  Breath Heart Rate Muscle Tone Assigned By   (greater than 7 no need for next measurement)   1 min 3  0  1  0  2  0  NICU   5 min 0  2  1  NICU   10 min NICU   15 min                 20 min                 25 min                 30 min                   Birth Weight: 3840 g (8 lb 7.5 oz) Height: 20.08" Head Circumference: 35 cm Observed Anomalies:      Cord: 3 Vessels     Complications: Cord around         Cord around: neck     Cord tension: loose     Number of loops: 1       Interventions: reduced       Clamping Delayed: 0  Clamped Date/Time:4/12  8:02 PM  Cord blood disposition: Lab     Gases sent: Yes       Stem  cell collection -by MD-: No  Maternal Info:   Placenta Delivery Date/Time: 4/12  8:03 PM     Removal: C-Section Removal     Appearance: Intact     Disposition: pathology  Bonding:     Stages of Labor:          Stage One:   h   m          Stage Two:   h   m          Stage Three:  0h  19m  Episiotomy: None      Lacerations: None                                   Vaginal Hematoma:No.       Procedures: Other cyst on left fallopian tube removed              Intraprocedure I/O Totals     None         31 year old [id number] admitted at [redacted]w[redacted]d with severe gestational hypertension, she was started on magnesium for seizure prophylaxis. Patient's other pregnancy risk factors include BMI 50, hx polysubstance abuse including recent cocaine use in pregnancy, depression, asthma, tobacco use, hx infant death secondary to SIDS. She is now s/p 4th repeat c-section complicated by dense fascial and intraabdominal adhesions necessitating bilateral Maylard incisions and T-shaped extension of the hysterotomy with difficult vacuum assisted extraction for the fetus. Patient with delivery to live female infant, birth weight 3840g with Apgars of 3, 5 and 8. Head delivered in LOA position. nuchal cord present, easily reduced. Placenta delivered intact with 3 vessel cord. Of note during the procedure, uterine  atony was encountered after delivery of the placenta. Uterine IM pitocin was administered and uterine massage performed with improvement in uterine tone.  EBL 2,000 cc. Patient and infant tolerated procedure well.    Dr. Burnadette Peter was present for the entire procedure.     Kathee Polite, MD  Obstetrics and Gynecology R3  Pager [phone]

## 2016-03-16 ENCOUNTER — Encounter: Payer: Self-pay | Admitting: Psychiatry

## 2016-03-16 ENCOUNTER — Other Ambulatory Visit: Payer: Self-pay

## 2016-03-16 ENCOUNTER — Ambulatory Visit (HOSPITAL_COMMUNITY)
Admission: EM | Admit: 2016-03-16 | Discharge: 2016-03-16 | Disposition: A | Discharge status: Home / Self Care | Payer: Medicaid Other | Attending: Family Medicine | Admitting: Family Medicine

## 2016-03-16 ENCOUNTER — Encounter (HOSPITAL_COMMUNITY): Payer: Self-pay | Admitting: Emergency Medicine

## 2016-03-16 DIAGNOSIS — J069 Acute upper respiratory infection, unspecified: Secondary | ICD-10-CM

## 2016-03-16 DIAGNOSIS — K029 Dental caries, unspecified: Secondary | ICD-10-CM

## 2016-03-16 MED ORDER — IPRATROPIUM BROMIDE 0.06 % NA SOLN: 2.0000 | Freq: Four times a day (QID) | NASAL | 1 refills | Status: DC

## 2016-03-16 MED ORDER — CLINDAMYCIN HCL 150 MG PO CAPS: 150.0000 mg | ORAL_CAPSULE | Freq: Four times a day (QID) | ORAL | 0 refills | Status: DC

## 2016-03-16 NOTE — Discharge Instructions (Signed)
Take medicine as prescribed, see your dentist as soon as possible °

## 2016-03-16 NOTE — ED Triage Notes (Signed)
[facility].   The patient stated that she has had a sore throat for 3 days, N/D for 4 days and chronic panic attacks.

## 2016-03-16 NOTE — ED Provider Notes (Signed)
[facility]    CSN: [id number] Arrival date & time: 03/16/16  [id number]     History   Chief Complaint Chief Complaint  Patient presents with  . Sore Throat    HPI Samantha Nguyen is a 31 y.o. female.   The history is provided by the patient.  Sore Throat  This is a new problem. The current episode started more than 2 days ago. The problem has been gradually worsening. Pertinent negatives include no chest pain and no headaches. The symptoms are aggravated by swallowing.    Past Medical History:  Diagnosis Date  . Anxiety   . Depressed   . Kidney stone   . Migraine     There are no active problems to display for this patient.   Past Surgical History:  Procedure Laterality Date  . CESAREAN SECTION      OB History    No data available       Home Medications    Prior to Admission medications   Medication Sig Start Date End Date Taking? Authorizing Provider  PARoxetine (PAXIL) 30 MG tablet Take 30 mg by mouth daily.   Yes Historical Provider, MD    Family History History reviewed. No pertinent family history.  Social History Social History  Substance Use Topics  . Smoking status: Never Smoker  . Smokeless tobacco: Never Used  . Alcohol use Not on file     Allergies   Sulfa antibiotics   Review of Systems Review of Systems  Constitutional: Negative.  Negative for fever.  HENT: Positive for congestion, dental problem, postnasal drip, rhinorrhea and sore throat.   Respiratory: Negative.   Cardiovascular: Negative.  Negative for chest pain.  Neurological: Negative for headaches.  All other systems reviewed and are negative.    Physical Exam Triage Vital Signs ED Triage Vitals  Enc Vitals Group     BP 03/16/16 1004 143/68     Pulse Rate 03/16/16 1004 119     Resp 03/16/16 1004 16     Temp 03/16/16 1004 98.4 F (36.9 C)     Temp Source 03/16/16 1004 Oral     SpO2 03/16/16 1004 98 %     Weight --      Height --      Head Circumference  --      Peak Flow --      Pain Score 03/16/16 1008 5     Pain Loc --      Pain Edu? --      Excl. in GC? --    No data found.   Updated Vital Signs BP 143/68 (BP Location: Right Arm)   Pulse 119   Temp 98.4 F (36.9 C) (Oral)   Resp 16   LMP 02/20/2016 (Exact Date)   SpO2 98%   Visual Acuity Right Eye Distance:   Left Eye Distance:   Bilateral Distance:    Right Eye Near:   Left Eye Near:    Bilateral Near:     Physical Exam  Constitutional: She is oriented to person, place, and time. She appears well-developed and well-nourished. No distress.  HENT:  Right Ear: External ear normal.  Left Ear: External ear normal.  Nose: Nose normal.  Mouth/Throat: Oropharynx is clear and moist.  Eyes: Conjunctivae and EOM are normal. Pupils are equal, round, and reactive to light.  Neck: Normal range of motion. Neck supple.  Cardiovascular: Normal rate, regular rhythm, normal heart sounds and intact distal pulses.   Pulmonary/Chest: Effort  normal and breath sounds normal.  Lymphadenopathy:    She has no cervical adenopathy.  Neurological: She is alert and oriented to person, place, and time.  Nursing note and vitals reviewed.    UC Treatments / Results  Labs (all labs ordered are listed, but only abnormal results are displayed) Labs Reviewed - No data to display  EKG  EKG Interpretation None       Radiology No results found.  Procedures Procedures (including critical care time)  Medications Ordered in UC Medications - No data to display   Initial Impression / Assessment and Plan / UC Course  I have reviewed the triage vital signs and the nursing notes.  Pertinent labs & imaging results that were available during my care of the patient were reviewed by me and considered in my medical decision making (see chart for details).  Clinical Course       Final Clinical Impressions(s) / UC Diagnoses   Final diagnoses:  None    New Prescriptions New  Prescriptions   No medications on file     Linna Hoff, MD 03/30/16 2101

## 2016-03-18 ENCOUNTER — Telehealth: Payer: Self-pay

## 2016-03-18 ENCOUNTER — Other Ambulatory Visit
Admission: RE | Admit: 2016-03-18 | Discharge: 2016-03-18 | Disposition: A | Discharge status: Home / Self Care | Payer: Self-pay | Source: Ambulatory Visit | Attending: Obstetrics and Gynecology | Admitting: Obstetrics and Gynecology

## 2016-03-18 DIAGNOSIS — Z6841 Body Mass Index (BMI) 40.0 and over, adult: Secondary | ICD-10-CM

## 2016-03-18 DIAGNOSIS — O0990 Supervision of high risk pregnancy, unspecified, unspecified trimester: Secondary | ICD-10-CM

## 2016-03-18 LAB — COMPREHENSIVE METABOLIC PANEL
ALT: 8 U/L (ref 0–35)
AST: 9 U/L (ref 0–35)
Albumin: 3.9 g/dL (ref 3.5–5.2)
Alk Phos: 95 U/L (ref 35–105)
Anion Gap: 13 (ref 7–16)
Bilirubin,Total: 0.2 mg/dL (ref 0.0–1.2)
CO2: 23 mmol/L (ref 20–28)
Calcium: 9.1 mg/dL (ref 8.8–10.2)
Chloride: 103 mmol/L (ref 96–108)
Creatinine: 0.7 mg/dL (ref 0.51–0.95)
GFR,Black: 134 *
GFR,Caucasian: 116 *
Glucose: 77 mg/dL (ref 60–99)
Lab: 10 mg/dL (ref 6–20)
Potassium: 4.4 mmol/L (ref 3.3–5.1)
Sodium: 139 mmol/L (ref 133–145)
Total Protein: 6.8 g/dL (ref 6.3–7.7)

## 2016-03-18 LAB — GLUCOSE TOLERANCE, 1 HOUR: Glucose,50gm 1HR: 121 mg/dL (ref 63–135)

## 2016-03-18 NOTE — Telephone Encounter (Signed)
Attempt made to contact pt as she NOS [facility] appointment.  No message left at [phone] secondary to female voice on voice mail message.  [facility] care manager has already been notified of pt's noncompliance.  Will send a letter asking pt to contact [facility] ASAP to schedule an appointment that is convenient for her.

## 2016-03-22 LAB — MATERNAL SERUM SCREENING

## 2016-03-23 ENCOUNTER — Other Ambulatory Visit: Payer: Self-pay | Admitting: Obstetrics & Gynecology

## 2016-03-23 ENCOUNTER — Telehealth: Payer: Self-pay

## 2016-03-23 DIAGNOSIS — O0970 Supervision of high risk pregnancy due to social problems, unspecified trimester: Secondary | ICD-10-CM

## 2016-03-23 NOTE — Telephone Encounter (Signed)
[facility] appointment on 03/25/16, needs HIV testing.   Glucola test has been completed.  Needs new order.  [facility] and attempt to get pt to go to lab at appointment on 03/25/16.

## 2016-03-25 ENCOUNTER — Ambulatory Visit: Payer: Self-pay | Admitting: Obstetrics and Gynecology

## 2016-03-25 ENCOUNTER — Ambulatory Visit: Payer: Self-pay

## 2016-03-25 ENCOUNTER — Encounter: Payer: Self-pay | Admitting: Obstetrics and Gynecology

## 2016-03-25 ENCOUNTER — Other Ambulatory Visit
Admission: RE | Admit: 2016-03-25 | Discharge: 2016-03-25 | Disposition: A | Discharge status: Home / Self Care | Payer: Self-pay | Source: Ambulatory Visit

## 2016-03-25 ENCOUNTER — Other Ambulatory Visit: Payer: Self-pay | Admitting: Obstetrics

## 2016-03-25 ENCOUNTER — Encounter: Payer: Self-pay | Admitting: Psychiatry

## 2016-03-25 VITALS — BP 124/70 | HR 80 | Ht 65.0 in | Wt 306.0 lb

## 2016-03-25 DIAGNOSIS — J45909 Unspecified asthma, uncomplicated: Secondary | ICD-10-CM

## 2016-03-25 DIAGNOSIS — F1411 Cocaine abuse, in remission: Secondary | ICD-10-CM

## 2016-03-25 DIAGNOSIS — O0992 Supervision of high risk pregnancy, unspecified, second trimester: Secondary | ICD-10-CM

## 2016-03-25 DIAGNOSIS — O0970 Supervision of high risk pregnancy due to social problems, unspecified trimester: Secondary | ICD-10-CM

## 2016-03-25 DIAGNOSIS — Z720 Tobacco use: Secondary | ICD-10-CM

## 2016-03-25 DIAGNOSIS — R0602 Shortness of breath: Secondary | ICD-10-CM

## 2016-03-25 DIAGNOSIS — R21 Rash and other nonspecific skin eruption: Secondary | ICD-10-CM

## 2016-03-25 DIAGNOSIS — Z3482 Encounter for supervision of other normal pregnancy, second trimester: Secondary | ICD-10-CM

## 2016-03-25 DIAGNOSIS — Z3A26 26 weeks gestation of pregnancy: Secondary | ICD-10-CM

## 2016-03-25 DIAGNOSIS — O0990 Supervision of high risk pregnancy, unspecified, unspecified trimester: Secondary | ICD-10-CM

## 2016-03-25 DIAGNOSIS — L732 Hidradenitis suppurativa: Secondary | ICD-10-CM

## 2016-03-25 LAB — TYPE AND SCREEN FOR PNP
ABO RH Blood Type: A POS
Antibody Screen: NEGATIVE

## 2016-03-25 LAB — POCT URINALYSIS DIPSTICK
Blood,UA POCT: NEGATIVE
Glucose,UA POCT: NORMAL mg/dL
Ketones,UA POCT: NEGATIVE mg/dL
Lot #: 23842401
Nitrite,UA POCT: NEGATIVE
PH,UA POCT: 7 (ref 5–8)
Protein,UA POCT: NEGATIVE mg/dL

## 2016-03-25 LAB — PROTEIN, URINE: Protein,UR: 13 mg/dL — ABNORMAL HIGH (ref 0–11)

## 2016-03-25 LAB — TP/CREATININE RATIO,UR: TP Creatinine ratio,UR: 0.07

## 2016-03-25 LAB — MULTIPLE ORDERING DOCS

## 2016-03-25 LAB — DRUG SCREEN CHEMICAL DEPENDENCY, URINE
Amphetamine,UR: NEGATIVE
Benzodiazepinen,UR: NEGATIVE
Cocaine/Metab,UR: NEGATIVE
Opiates,UR: NEGATIVE
THC Metabolite,UR: NEGATIVE

## 2016-03-25 LAB — CREATININE, URINE: Creatinine,UR: 188 mg/dL (ref 20–300)

## 2016-03-25 MED ORDER — ALBUTEROL SULFATE HFA 108 (90 BASE) MCG/ACT IN AERS *I*
1.0000 | INHALATION_SPRAY | Freq: Four times a day (QID) | RESPIRATORY_TRACT | 1 refills | Status: DC | PRN
Start: 2016-03-25 — End: 2016-05-30

## 2016-03-25 MED ORDER — HYDROCORTISONE 1 % EX CREA *I*
TOPICAL_CREAM | Freq: Two times a day (BID) | CUTANEOUS | Status: AC
Start: 2016-03-25 — End: 2016-05-24

## 2016-03-25 NOTE — Progress Notes (Signed)
I saw and evaluated the patient.  Details of my evaluation are as follows:     31 y.o. yo [id number] @ [redacted]w[redacted]d wks ega.    Pregnancy complicated by:   Patient Active Problem List   Diagnosis Code    Hidradenitis L73.2    Tobacco abuse Z72.0    Obesity E66.9    Cocaine abuse F14.11    Hx of LTCS x 3 Z98.891    Asthma J45.909    Supervision of high risk pregnancy due to social problems O09.70    Depression F32.9    Family history of SIDS (sudden infant death syndrome) Z84.82       Problem list, vitals, family & social history, medications, and allergies were reviewed and updated as appropriate today.  Pt states that she is stressed. Her sisters two children ages 2 and 5 are now living with pt. Tearra says that her sister "had a nervous breakdown and is out on the streets".  Prudy's children are living with her aunt and she visits with them on weekends.    Pt acknowledges that she missed her mental health appointments. States that she has an appointment at [facility] on 1/23. She has medicaid cabs set up for transport to appointments.    Pt admits to recent marijuana use and says "I am going to test positive"    Complains of inability to empty her bladder which she attributes to holding her urine to long because she doesn't want to walk to the bathroom.    Reports that she becomes short of breath when climbing stairs for the last few months.    Complains of a rash on her left forearm that is itchy.    Recently saw Dr. Alvester Morin (plastics) for hydradenitis management.    [place] ([name]) has recently gotten a job and pt is hopeful that this will help their financial situation.    Pt requests a tubal ligation for birth control. She states that [place] ([name]) wants to have more children but she does not.    Requests social work visit today.    Contractions: No  Loss of fluid: No  Vaginal bleeding: No    OBJECTIVE  Vitals:    03/25/16 1306   BP: 124/70   Pulse: 80   Weight: (!) 138.8 kg (306 lb)   Height: 1.651 m (5\' 5" )        EXAM:   LE exam: trace  Pelvic: Exam deferred.  Physical Exam: General:  NAD and Pleasant and Interactive  Pt ambulates without assistance    Left arm: rash is limited to inner aspect of forearm, no drainage, slightly darker brown in color than pt normal skin tone. Appears to be contact dermatitis    ORDERED TESTING:     Orders Placed This Encounter   Procedures    Aerobic culture (Urine voided)     Standing Status:   Future     Number of Occurrences:   1     Standing Expiration Date:   03/25/2017    Prenatal profile     Standing Status:   Future     Number of Occurrences:   1     Standing Expiration Date:   09/21/2016    HIV 1&2 antigen/antibody     Standing Status:   Future     Number of Occurrences:   1     Standing Expiration Date:   09/21/2016     Order Specific Question:   Discussion of HIV testing  completed and verbal consent to test obtained from patient/proxy?     Answer:   Yes    Lead, blood     Standing Status:   Future     Number of Occurrences:   1     Standing Expiration Date:   09/21/2016     Order Specific Question:   Lead test reason     Answer:   Initial     Order Specific Question:   Lead test specimen type     Answer:   Venous    Drug screen chemical dependency, urine     Standing Status:   Future     Number of Occurrences:   1     Standing Expiration Date:   04/24/2016     Order Specific Question:   Quant THC if positive     Answer:   UCDS + REFLEX     Order Specific Question:   Drug confirmations     Answer:   YES    Protein, urine     Standing Status:   Future     Number of Occurrences:   1     Standing Expiration Date:   03/25/2017     Order Specific Question:   Collection type     Answer:   RANDOM    Creatinine, urine     Standing Status:   Future     Number of Occurrences:   1     Standing Expiration Date:   03/25/2017     Order Specific Question:   Collection type     Answer:   RANDOM    TP/creatinine ratio,UR    POCT urinalysis dipstick    Echo Complete     Standing Status:    Future     Standing Expiration Date:   04/25/2017     Order Specific Question:   Cardiac indication     Answer:   Dyspnea     Order Specific Question:   Cardiac indication     Answer:   Other(See Comment)     Order Specific Question:   Comment     Answer:   pt is [redacted] weeks pregnant     Order Specific Question:   Site preference     Answer:   [facility]     Order Specific Question:   Should the beta blocker be held?     Answer:   No     Order Specific Question:   Would you like this patient to have an agitated saline study?     Answer:   No     Order Specific Question:   Is this an RV Focused Echo for Pulmonary Hypertension     Answer:   No       ASSESSMENT/PLAN  30 y.o. [id number] at [redacted]w[redacted]d with high-risk pregnancy.    Problems updated, addressed and changed today include:   Problem   Cocaine abuse    Substances abused:  Cocaine  Last illicit use: September 2017, denies use on 02/12/2016   SW involved?:  yes  Tx History:  None  UCDS:  positive for cocaine on 11/24/15    If ongoing illicit use:   Surveillance / Delivery Planning: Refer to MFM Division Guidelines       See "Pregnancy" problem for documentation of plan    <> repeat UCDS 1/11       Tobacco Abuse   Hidradenitis    - Continued use of topical clindamycin and chlorhexidine as  prescribed during her hospitalization.  - Plastics appointment with Dr. Alvester MorinBell on 03/12/16       Prescription for hydrocortisone cream for rash on arm.    Shortness of breath is likely related to asthma and obesity in pregnancy. Will order an echocardiogram given pt history and risk factors for heart disease.    Encouraged pt to refrain from drug use and to [facility]    Pt agrees to having prenatal labs and HIV testing today.    Ultrasound today following office visit    DSS consent for sterilization reviewed with pt and signed. Discussed that the consent is not binding and she can change her mind.  Delivery plan is for a repeat cesarean section.    Elissa HeftySarah Gallivan,  MSW and Roselie AwkwardBeth Goldenberg psych NP in to see pt today.  Return to clinic in 2 weeks.    Dierdre HighmanKATHRYN Khaleelah Yowell, NP  Maternal Fetal Medicine

## 2016-03-25 NOTE — Progress Notes (Signed)
Will need repeat USN at 28 wk d/t maternal obesity.

## 2016-03-25 NOTE — Progress Notes (Signed)
Patient at [redacted]w[redacted]d given "20 Weeks and Beyond" educational materials.     Reviewed contents with patient and she states understanding.     Dejia Ebron B Shalla Bulluck, RN

## 2016-03-25 NOTE — Telephone Encounter (Signed)
[facility] pt willing to go to lab today for blood draw.  Will postpone to follow

## 2016-03-26 LAB — PRENATAL PROFILE
Baso # K/uL: 0 10*3/uL (ref 0.0–0.1)
Basophil %: 0.4 %
Eos # K/uL: 0.1 10*3/uL (ref 0.0–0.4)
Eosinophil %: 0.9 %
HBV S Ag: NEGATIVE
Hematocrit: 31 % — ABNORMAL LOW (ref 34–45)
Hemoglobin: 9.7 g/dL — ABNORMAL LOW (ref 11.2–15.7)
IMM Granulocytes #: 0.1 10*3/uL (ref 0.0–0.1)
IMM Granulocytes: 0.8 %
Lymph # K/uL: 2.1 10*3/uL (ref 1.2–3.7)
Lymphocyte %: 20.1 %
MCH: 28 pg/cell (ref 26–32)
MCHC: 31 g/dL — ABNORMAL LOW (ref 32–36)
MCV: 88 fL (ref 79–95)
Mono # K/uL: 0.7 10*3/uL (ref 0.2–0.9)
Monocyte %: 7 %
Neut # K/uL: 7.3 10*3/uL — ABNORMAL HIGH (ref 1.6–6.1)
Nucl RBC # K/uL: 0 10*3/uL (ref 0.0–0.0)
Nucl RBC %: 0.1 /100 WBC (ref 0.0–0.2)
Platelets: 281 10*3/uL (ref 160–370)
RBC: 3.5 MIL/uL — ABNORMAL LOW (ref 3.9–5.2)
RDW: 15.5 % — ABNORMAL HIGH (ref 11.7–14.4)
Rubella IgG AB: POSITIVE
Seg Neut %: 70.8 %
Syphilis Screen: NEGATIVE
Syphilis Status: NONREACTIVE
WBC: 10.3 10*3/uL — ABNORMAL HIGH (ref 4.0–10.0)

## 2016-03-26 LAB — LEAD, BLOOD

## 2016-03-26 LAB — LEAD VENOUS: Lead,Venous: <1 ug/dl (ref 0–5)

## 2016-03-26 LAB — AEROBIC CULTURE: Aerobic Culture: 0

## 2016-03-26 LAB — HIV 1&2 ANTIGEN/ANTIBODY: HIV 1&2 ANTIGEN/ANTIBODY: NONREACTIVE

## 2016-03-26 NOTE — Progress Notes (Signed)
Social Work Note- Risk Evaluation and Psychosocial Assessment     03/25/16    Referral:  Pt is a 31yo, G7P4 at [facility] for an OBC at ~26wks, referred to SW to assess needs during pregnancy.    Contacts:   Pt, Jenna Cramp NP, Montel Culver NP, chart review    Risk Factors:  *Long h/o of substance abuse and depression with recent psychiatric admission (11/24/15-12/16/15)   *Children out of her care    Address:   [address]  [place] [address]     Phone:  [phone] ([name] cell #)    Household:  Pt lives with [name] Jenna [name]).    Employment/Income Source:  Pt is financially supported by [employer].  Pt currently sanctioned from [facility] due to non-compliance with substance abuse tx.               Insurance:  [place],    [facility],    [phone]  [place],    [place],    [id number]    Agency Involvement:  Pt reports no involvement with any community agencies - though it does appear that she was linked with a case mgr through [address] [place], [phone]) during her psychiatric admission in the fall,  will need to explore whether this relationship is current at her next visit.  Pt does report that her two eldest children are involved with services through [facility] and that she has contact with their worker - but she does not identify this as a service for her, only her children.  She also reports to having an upcoming appt (on 1/23) at [facility].    Information:  I met with pt, along with Jenna Cramp NP.  Pt is known to SW through previous pregnancies.  To summarize; risks factors of substance abuse & depression were present throughout these pregnancies as well.  At the time of her last delivery (in 2014), pt was in a drug tx program ([facility]), family was caring for her other children, CPS was not involved, and her baby was discharged home with her.      Pt is at today's appointment with her niece & nephew, reports that she has been caring for the children since her sister left them ~1 week  ago.  It is unclear how long these children will be with her, indicated that it was her mother who was suppose to be caring for them, but a recent medical diagnosis has complicated this.  She reports that all of her children are being cared for by family.  It is unclear at this time when her youngest left her care or if older children returned to live with her at any point since 2014.  She suggests that there is a future plan of her children being transitioned back to her, but denies any current CPS, preventive program, or court involvement.  Only program she reports involvement with is [facility], but states that this program is really for her children & aunt (who is caring for kids) though she does participate in family meetings.  She does not know the name of the program through [facility] or any contact names/numbers.  She is interested in some kind of perinatal support program that can provide support & assistance to her.    Plan:  1.Release to speak with [facility] obtained.  Pt suggests that I contact her aunt Jenna [name], [phone]) for specific program contact information.  I plan to contact [facility] to get better understanding  of program involvement to avoid any duplication of services - will plan to refer to [facility] or [facility] after this conversation.  Pt aware of the plan.  2.SW to continue to follow, will plan to meet with pt at her next appt to update on program referral and check enrollment at [facility].    [name], [place]

## 2016-03-30 NOTE — Progress Notes (Signed)
I met today with Marchele in follow-up.  She had her sister's two young children with her due to her "sister just walking the streets."  Per Caryl Pina, last week her sister impulsively decided she no longer wanted to take care of their mother (recently diagnosed with ALS) so she abandoned the children with the intent of CPS placing them in foster care.  Desarae decided to then take them in.  On interview today, Johnika is nicely dressed, affect fairly bright.  No evidence of thought disorder, hallucinations or delusions. She interacted with the children appropriately.  As I entered the room, Lianni immediately said "I know you are going to be mad at me." and hung her head.  She was referring to her not attending the mental health appt I had set up for her (this was the second one she had missed).  I told her that she is an adult and can make her own choices but I would not be making any more appts on her behalf.  I also told her we would not be prescribing her psychiatric medications given her not following through with mental health services which is the most appropriate place for her to go for ongoing psychiatric care.  She then stated she had about a week's supply left and I gently confronted her, saying that if she had been taking the meds properly, she should have run out about 3 weeks ago.  She then admitted she was taking her medications only episodically.  I reviewed with her that since she was not taking the medications properly, they were offered minimal therapeutic benefit so there is no urgency to restart them unless she is experiencing worsening psychiatric symptoms.  She denies any suicidal ideation, actively participated in the interview but was unable to engage in concrete problem-solving about how to address current life stressors.  She has ongoing issues with her own children (who are currently living with her mother), especially her son with ADHD.  Of note, Sumie, on her own, has contacted [facility] Co[facility]  to reengage in program "so I can go to groups and deal with my stress."  I offered support for her efforts and told her I would "keep an eye on her" in clinic to monitor for any psychiatric decompensation.  I provided her the list of mental health agencies that have walk-in hours and encouraged her to consider one of these programs.    I will continue to follow.

## 2016-04-01 NOTE — Telephone Encounter (Signed)
Labs completed

## 2016-04-02 ENCOUNTER — Encounter: Payer: Self-pay | Admitting: Obstetrics and Gynecology

## 2016-04-02 ENCOUNTER — Ambulatory Visit: Payer: Self-pay | Admitting: Obstetrics and Gynecology

## 2016-04-02 VITALS — BP 140/80 | HR 93 | Temp 96.6°F | Ht 65.0 in | Wt 308.4 lb

## 2016-04-02 DIAGNOSIS — L309 Dermatitis, unspecified: Secondary | ICD-10-CM

## 2016-04-02 MED ORDER — TRIAMCINOLONE ACETONIDE 0.1 % EX OINT *I*
TOPICAL_OINTMENT | Freq: Two times a day (BID) | CUTANEOUS | 0 refills | Status: DC
Start: 2016-04-02 — End: 2016-04-02

## 2016-04-02 MED ORDER — EUCERIN INTENSIVE REPAIR EX LOTN *I*
TOPICAL_LOTION | CUTANEOUS | 0 refills | Status: DC
Start: 2016-04-02 — End: 2016-07-08

## 2016-04-02 MED ORDER — EUCERIN INTENSIVE REPAIR EX LOTN *I*
TOPICAL_LOTION | CUTANEOUS | 0 refills | Status: DC
Start: 2016-04-02 — End: 2016-04-02

## 2016-04-02 MED ORDER — TRIAMCINOLONE ACETONIDE 0.1 % EX OINT *I*
TOPICAL_OINTMENT | Freq: Two times a day (BID) | CUTANEOUS | 0 refills | Status: DC
Start: 2016-04-02 — End: 2016-07-08

## 2016-04-02 NOTE — Progress Notes (Signed)
Reason For Visit: Dermatitis    HPI:  Jenna [name] is a 31 y.o. year old female with PMH significant for hidradenitis, tobacco abuse, cocaine abuse, depression, obesity, asthma.  She is currently about [redacted] weeks pregnant.    She reports that she has had a rash on her right arm for the past week.  She notes that it itches and burns and has been getting bigger.  She is been using hydrocortisone cream for the past few days and notes a burning sensation when she applies it.  She denies using any new laundry detergent or soaps.  She does note that it has been harder for her to stand while doing dishes so she has been resting her arm on the middle part of the kitchen today she is doing dishes.  She notes that her rash symptoms began shortly after she started leaning on the sink.    No other family members have a rash.    Medications:     Current Outpatient Prescriptions   Medication Sig    Emollient (EUCERIN INTENSIVE REPAIR) LOTN Apply twice daily    triamcinolone (KENALOG) 0.1 % ointment Apply topically 2 times daily    albuterol HFA 108 (90 BASE) MCG/ACT inhaler Inhale 1-2 puffs into the lungs every 6 hours as needed for Wheezing   Shake well before each use.    chlorhexidine (HIBICLENS) 4 % external liquid Apply topically daily    QUEtiapine (SEROQUEL) 25 MG tablet Take 1 tablet (25 mg total) by mouth 3 times daily    sertraline (ZOLOFT) 100 MG tablet Take 0.5 tablets (50 mg total) by mouth daily    traZODone (DESYREL) 100 MG tablet Take 1 tablet (100 mg total) by mouth nightly    acetaminophen (TYLENOL) 500 mg tablet Take 1 tablet (500 mg total) by mouth every 4-6 hours as needed for Pain    prenatal plus iron (PRENAVITE) tablet Take 1 tablet by mouth daily     Current Facility-Administered Medications   Medication    hydrocortisone 1 % cream       Medication list reconciled this visit    Allergies:     No Known Allergies (drug, envir, food or latex)        Review of Systems   Review of Systems    Constitutional: Negative.    Respiratory: Negative.    Cardiovascular: Negative.    Skin: Positive for rash.        Right forearm       Physical Exam:   Physical Exam   Constitutional: She is oriented to person, place, and time. She appears well-developed and well-nourished.   Currently 27 6/[redacted] weeks pregnant   Cardiovascular: Normal rate, regular rhythm and normal heart sounds.    Pulmonary/Chest: Effort normal and breath sounds normal.   Abdominal:   Gravid   Neurological: She is alert and oriented to person, place, and time.   Skin:   3 inch area of hyperpigmented, dry, coarse, skin with irregular border right forearm         Vitals:    04/02/16 1400   BP: 140/80   Pulse: 93   Temp: 35.9 C (96.6 F)   TempSrc: Temporal   SpO2: 99%   Weight: (!) 139.9 kg (308 lb 6.4 oz)   Height: 1.651 m (5\' 5" )         ASSESSMENT/PLAN:     1. Dermatitis  Rash on right forearm appears to be consistent with dermatitis.  Advised to discontinue  hydrocortisone cream  Advised to try triamcinolone cream  Advised to call if no improvement in one week  Is followed at special care clinic for her pregnancy    Follow up with CCP      Vella Redheadammy A Harcleroad, NP on 04/02/2016 at 4:51 PM

## 2016-04-04 ENCOUNTER — Ambulatory Visit (HOSPITAL_COMMUNITY)
Admission: EM | Admit: 2016-04-04 | Discharge: 2016-04-04 | Disposition: A | Discharge status: Home / Self Care | Payer: Medicaid Other | Attending: Emergency Medicine | Admitting: Emergency Medicine

## 2016-04-04 ENCOUNTER — Ambulatory Visit (INDEPENDENT_AMBULATORY_CARE_PROVIDER_SITE_OTHER): Payer: Medicaid Other

## 2016-04-04 ENCOUNTER — Encounter (HOSPITAL_COMMUNITY): Payer: Self-pay | Admitting: *Deleted

## 2016-04-04 DIAGNOSIS — B9789 Other viral agents as the cause of diseases classified elsewhere: Secondary | ICD-10-CM

## 2016-04-04 DIAGNOSIS — J069 Acute upper respiratory infection, unspecified: Secondary | ICD-10-CM | POA: Diagnosis not present

## 2016-04-04 MED ORDER — BENZONATATE 100 MG PO CAPS: 100.0000 mg | ORAL_CAPSULE | Freq: Three times a day (TID) | ORAL | 0 refills | Status: DC

## 2016-04-04 MED ORDER — FLUTICASONE PROPIONATE 50 MCG/ACT NA SUSP: 2.0000 | Freq: Every day | NASAL | 2 refills | Status: DC

## 2016-04-04 NOTE — Discharge Instructions (Signed)
°  You may take 400-600mg  Ibuprofen (Motrin) every 6-8 hours for fever and pain  Alternate with Tylenol  You may take 500mg  Tylenol every 4-6 hours as needed for fever and pain. You may also take over the counter Robitussin or Mucinex to help keep mucous thin making coughing more efficient.  Sinus rinses may also be helpful with congestion. Follow-up with your primary care provider next week for recheck of symptoms if not improving.  Be sure to drink plenty of fluids and rest, at least 8hrs of sleep a night, preferably more while you are sick. Return urgent care or go to closest ER if you cannot keep down fluids/signs of dehydration, fever not reducing with Tylenol, difficulty breathing/wheezing, stiff neck, worsening condition, or other concerns (see below)

## 2016-04-04 NOTE — ED Provider Notes (Signed)
CSN: [id number]     Arrival date & time 04/04/16  1405 History   First MD Initiated Contact with Patient 04/04/16 1646     Chief Complaint  Patient presents with  . Cough  . Nasal Congestion   (Consider location/radiation/quality/duration/timing/severity/associated sxs/prior Treatment) HPI Samantha Nguyen is a 31 y.o. female presenting to UC with c/o cough with nasal congestion for about 5 days. Cough is mildly productive.  Today she noticed pinkish phlegm.  She has been feeling feverish but no recorded fevers.  Denies n/v/d. No known sick contacts.  She has taken Nyquil, Vit C, and used Vicks vapor rub with mild temporary relief. No hx of asthma.    Past Medical History:  Diagnosis Date  . Anxiety   . Depressed   . Kidney stone   . Migraine    Past Surgical History:  Procedure Laterality Date  . CESAREAN SECTION    . MANDIBLE SURGERY     No family history on file. Social History  Substance Use Topics  . Smoking status: Former Games developermoker  . Smokeless tobacco: Never Used  . Alcohol use Yes     Comment: occasionally   OB History    No data available     Review of Systems  Constitutional: Positive for chills and fever (subjective).  HENT: Positive for congestion, postnasal drip, rhinorrhea and sore throat. Negative for ear pain, nosebleeds, sinus pain, sinus pressure, trouble swallowing and voice change.   Respiratory: Positive for cough. Negative for shortness of breath.   Cardiovascular: Negative for chest pain and palpitations.  Gastrointestinal: Negative for abdominal pain, diarrhea, nausea and vomiting.  Musculoskeletal: Negative for arthralgias, back pain and myalgias.  Skin: Negative for rash.    Allergies  Sulfa antibiotics  Home Medications   Prior to Admission medications   Medication Sig Start Date End Date Taking? Authorizing Provider  ipratropium (ATROVENT) 0.06 % nasal spray Place 2 sprays into both nostrils 4 (four) times daily. 03/16/16  Yes Linna HoffJames D Kindl, MD   PARoxetine (PAXIL) 30 MG tablet Take 30 mg by mouth daily.   Yes Historical Provider, MD  benzonatate (TESSALON) 100 MG capsule Take 1 capsule (100 mg total) by mouth every 8 (eight) hours. 04/04/16   Junius FinnerErin O'Malley, PA-C  clindamycin (CLEOCIN) 150 MG capsule Take 1 capsule (150 mg total) by mouth 4 (four) times daily. 03/16/16   Linna HoffJames D Kindl, MD  fluticasone (FLONASE) 50 MCG/ACT nasal spray Place 2 sprays into both nostrils daily. For at least 2 weeks 04/04/16   Junius FinnerErin O'Malley, PA-C   Meds Ordered and Administered this Visit  Medications - No data to display  BP 129/74   Pulse 88   Temp 98.1 F (36.7 C) (Oral)   Resp 16   LMP 03/19/2016 (Approximate)   SpO2 100%  No data found.   Physical Exam  Constitutional: She is oriented to person, place, and time. She appears well-developed and well-nourished. No distress.  HENT:  Head: Normocephalic and atraumatic.  Right Ear: Tympanic membrane normal.  Left Ear: Tympanic membrane normal.  Nose: Mucosal edema present. Right sinus exhibits no maxillary sinus tenderness and no frontal sinus tenderness. Left sinus exhibits no maxillary sinus tenderness and no frontal sinus tenderness.  Mouth/Throat: Uvula is midline and mucous membranes are normal. Posterior oropharyngeal erythema present. No oropharyngeal exudate, posterior oropharyngeal edema or tonsillar abscesses.  Eyes: EOM are normal.  Neck: Normal range of motion. Neck supple.  Cardiovascular: Normal rate and regular rhythm.   Pulmonary/Chest: Effort normal  and breath sounds normal. No stridor. No respiratory distress. She has no wheezes. She has no rales.  Musculoskeletal: Normal range of motion.  Lymphadenopathy:    She has no cervical adenopathy.  Neurological: She is alert and oriented to person, place, and time.  Skin: Skin is warm and dry. She is not diaphoretic.  Psychiatric: She has a normal mood and affect. Her behavior is normal.  Nursing note and vitals reviewed.   Urgent  Care Course     Procedures (including critical care time)  Labs Review Labs Reviewed - No data to display  Imaging Review Dg Chest 2 View  Result Date: 04/04/2016 CLINICAL DATA:  Cough, congestion for 1 week.  Coughing of pinkish:. EXAM: CHEST  2 VIEW COMPARISON:  None. FINDINGS: The heart size and mediastinal contours are within normal limits. Both lungs are clear. The visualized skeletal structures are unremarkable. IMPRESSION: No active cardiopulmonary disease. Electronically Signed   By: Elige Ko   On: 04/04/2016 17:15     MDM   1. Viral URI with cough    Pt c/o URI symptoms for 5 days.   CXR: No active cardiopulmonary disease.  No evidence of underlying bacterial infection on exam. Encouraged symptomatic treatment at this time.  Rx: Tessalon and Flonase Encouraged fluids, rest, sinus rinses and humidifier. Acetaminophen and ibuprofen for fever or pain.  F/u with PCP in 1 week if not improving.    Junius Finner, PA-C 04/04/16 1738

## 2016-04-04 NOTE — ED Triage Notes (Signed)
C/O productive cough, nasal congestion x 5 days.  Reports now coughing up pinkish phlegm.  Has been feeling feverish.  Has been taking Nyquil, Vit C, Vicks Vapor Rub.

## 2016-04-06 ENCOUNTER — Telehealth: Payer: Self-pay

## 2016-04-06 NOTE — Telephone Encounter (Signed)
Spoke with patient who is calling with concern about very dark urine noted today in toilet, with noted dysuria and lower back pain.  Patient states "I don't drink any water".  Reviewed importance of drinking a lot of fluid every day, (8 glasses daily minimum), throughout pregnancy and that she should almost not see urine in the toilet.  Patient given appt on whp urgent schedule tomorrow, 04/06/2016 with patient stated willingness and ability to come at scheduled time.

## 2016-04-06 NOTE — Telephone Encounter (Signed)
Patient is 28 weeks and 3 days pregnant. Patient is a [facility] patient. Patient reports she just went to the bathroom and her urine is a very dark color. She states the urine resembles closely to the color or the fruit orange. Patient also reports back pain. Pain level: 7

## 2016-04-07 ENCOUNTER — Encounter: Payer: Self-pay | Admitting: Obstetrics and Gynecology

## 2016-04-08 ENCOUNTER — Telehealth: Payer: Self-pay

## 2016-04-08 NOTE — Telephone Encounter (Signed)
Attempt made to contact pt.  No message was left as there was female voice on voice mail.. Letter was sent with pt's next appointment date and time.

## 2016-04-09 ENCOUNTER — Telehealth: Payer: Self-pay

## 2016-04-09 NOTE — Progress Notes (Addendum)
Social Work Note -   I telephoned pt in follow-up to yesterday's missed appt, was able to speak with her (at [phone]).  Pt forgot about appt, updated on appts for next week.  Since our last visit, I had spoken with Cassell Smilesuth Autore [facility] about [facility].  Program has availability and would be able to accept pt.  I started to discuss this on the phone with pt, but she expressed concern about being involved with a preventive program - saying "I don't want anything with a mandated reporter" and "I'm doing fine without any programs."  Pt then stated that she wants [facility].  Discussion about what pt wanted/didn't want was difficult to do by telephone - plan was made to further discuss this at her appt next week.    Elissa HeftySarah Adin Lariccia, LMSW [phone]

## 2016-04-13 ENCOUNTER — Telehealth: Payer: Self-pay

## 2016-04-13 NOTE — Telephone Encounter (Signed)
-----   Message from Dierdre HighmanKathryn Flynn, NP sent at 03/25/2016  4:01 PM EST -----  I am not sure if you are doing [facility] or someone else. Please enlighten me.  Jenna [name] needs a maternal echocardiogram for shortness of breath. I put in an order for her. She has medicaid cabs set up for transportation.

## 2016-04-13 NOTE — Telephone Encounter (Signed)
Telephone call to [facility]  Prior auth necessary   PA Obtained via eviCore: [id number]

## 2016-04-13 NOTE — Telephone Encounter (Signed)
Pt scheduled for 2/1 1p Echo at the [facility]. Aware to take the shuttle to [address] following her appointment.

## 2016-04-14 ENCOUNTER — Encounter: Payer: Self-pay | Admitting: Student in an Organized Health Care Education/Training Program

## 2016-04-15 ENCOUNTER — Ambulatory Visit: Admission: RE | Admit: 2016-04-15 | Payer: Self-pay | Source: Ambulatory Visit | Admitting: Cardiology

## 2016-04-15 ENCOUNTER — Encounter: Payer: Self-pay | Admitting: Student in an Organized Health Care Education/Training Program

## 2016-04-15 ENCOUNTER — Ambulatory Visit
Admission: RE | Admit: 2016-04-15 | Discharge: 2016-04-15 | Disposition: A | Discharge status: Home / Self Care | Payer: Self-pay | Source: Ambulatory Visit | Attending: Cardiology | Admitting: Cardiology

## 2016-04-15 ENCOUNTER — Telehealth: Payer: Self-pay

## 2016-04-15 ENCOUNTER — Ambulatory Visit: Payer: Self-pay

## 2016-04-15 DIAGNOSIS — Z3482 Encounter for supervision of other normal pregnancy, second trimester: Secondary | ICD-10-CM

## 2016-04-15 DIAGNOSIS — R0602 Shortness of breath: Secondary | ICD-10-CM

## 2016-04-15 LAB — ECHO COMPLETE
Aortic Diameter (sinus of Valsalva): 3 cm
BMI: 51.4 kg/m2
BSA: 2.53 m2
Deceleration Time - MV: 130 ms
E/A ratio: 1.1
Heart Rate: 78 {beats}/min
Height: 65 in
LA Diameter BSA Index: 1.3 cm/m2
LA Diameter Height Index: 1.9 cm/m
LA Diameter: 3.2 cm
LA Systolic Vol BSA Index: 25.3 mL/m2
LA Systolic Vol Height Index: 38.8 mL/m
LA Systolic Volume: 64 mL
LV ASE Mass BSA Index: 84.3 gm/m2
LV ASE Mass Height 2.7 Index: 55.1 gm/m2.7
LV ASE Mass Height Index: 129.1 gm/m
LV ASE Mass: 213.2 gm
LV Isovolumic Relaxation Time: 75 ms
LV Posterior Wall Thickness: 1 cm
LV Septal Thickness: 1 cm
LVED Diameter BSA Index: 2.2 cm/m2
LVED Diameter Height Index: 3.3 cm/m
LVED Diameter: 5.5 cm
LVOT Area (calculated): 3.53 cm2
LVOT Cardiac Index: 2.25 L/min/m2
LVOT Cardiac Output: 5.7 L/min
LVOT Diameter: 2.12 cm
LVOT PWD VTI: 20.7 cm
LVOT PWD Velocity (mean): 78.8 cm/s
LVOT PWD Velocity (peak): 111.1 cm/s
LVOT SV BSA Index: 28.87 mL/m2
LVOT SV Height Index: 44.2 mL/m
LVOT Stroke Rate (mean): 278 mL/s
LVOT Stroke Rate (peak): 392 mL/s
LVOT Stroke Volume: 73.03 cc
MV Peak A Velocity: 58.2 cm/s
MV Peak E Velocity: 64 cm/s
Mitral Annular E/Ea Vel Ratio: 9.14
Mitral Annular Ea Velocity: 7 cm/s
RR Interval: 769.23 ms
Weight (lbs): 308.42 [lb_av]
Weight: 4934.78 oz

## 2016-04-15 NOTE — Telephone Encounter (Signed)
Pt NOS [facility] appointment on 04/15/16.  Will attempt to reach pt in the AM to assist in rescheduling.

## 2016-04-16 NOTE — Telephone Encounter (Signed)
Writer returned call because there was no appointment scheduled.  Pt offered 04/20/16 appointment, unable to accept.  [facility]

## 2016-04-16 NOTE — Telephone Encounter (Signed)
Pt states that she is on the " other line with someone who is helping me schedule my appointment."   Writer informs pt this is why Clinical research associatewriter was calling.  Pt was asked if she had anything to discuss with the nurse and pt states " not really."   Writer encouraged pt to stay on line with other person and schedule appointment.   Pt voiced understanding, call ended.

## 2016-04-16 NOTE — Telephone Encounter (Signed)
[facility].

## 2016-04-22 ENCOUNTER — Encounter: Payer: Self-pay | Admitting: Obstetrics and Gynecology

## 2016-04-22 ENCOUNTER — Telehealth: Payer: Self-pay

## 2016-04-22 NOTE — Telephone Encounter (Signed)
No show for [facility] today - call placed to patient and she agreed to reschedule to next week.

## 2016-04-29 ENCOUNTER — Telehealth: Payer: Self-pay

## 2016-04-29 ENCOUNTER — Encounter: Payer: Self-pay | Admitting: Internal Medicine

## 2016-04-29 NOTE — Telephone Encounter (Signed)
[facility] - call placed to patient and voice mail left asking that she call the office. Will mail letter with the same request.

## 2016-05-07 NOTE — Telephone Encounter (Signed)
Attempt made to contact pt as no upcoming [facility] appointments.  Home phone is not accepting calls. Other phone is not working.  Pt was sent letter on 04/29/16 asking for her to contact [facility] to schedule an appointment.  Per Erecord review, she does not read KeySpanMyChart messages.  Will postpone encounter and review in one week.

## 2016-05-14 NOTE — Telephone Encounter (Signed)
[facility]

## 2016-05-19 NOTE — Progress Notes (Signed)
Social Work Note -   I met with Phala today when she walked into the [place] office without an appointment asking to speak with SW.  Ajanee had her niece & nephew with her, expressed that she was feeling overwhelmed, and began to cry.  She apologized for missing OB appts, stating that she has been too busy to come.  She does plan to come to her scheduled [facility] appt next week, also reports that she has an intake appt at [facility] on 3/9.  Caryl Pina asked for help with diapers for her niece & nephew, also requested bus passes to get to [facility] later this week.  At this time, it is unclear how much longer she will be caring for her sister's children - indicated that her sister was currently in a DV shelter.  During a recent school break, pt's children also stayed with her - which she describes as a "terrible" experience.  I attempted to discuss the [facility] [employer] program with pt, but she did not want to talk about it at this time.  She was interested in [employer].  Plan -  1.Pt given diapers, bus passes, and Crisis Nursery contact information.  2.SW to continue to follow, will plan to check in with pt next week for update on [facility] appt.    [name], [place]

## 2016-05-21 ENCOUNTER — Telehealth: Payer: Self-pay

## 2016-05-21 NOTE — Telephone Encounter (Signed)
Can you please schedule this pt with social work after her next OB visit?    Thank you.

## 2016-05-25 ENCOUNTER — Telehealth: Payer: Self-pay

## 2016-05-25 ENCOUNTER — Encounter: Payer: Self-pay | Admitting: Obstetrics & Gynecology

## 2016-05-25 ENCOUNTER — Ambulatory Visit: Payer: Medicaid (Managed Care)

## 2016-05-25 NOTE — Telephone Encounter (Signed)
Pt called to [facility] missed appt today for 3/20

## 2016-05-25 NOTE — Telephone Encounter (Signed)
Attempt made to contact pt as she NOS [facility] appointment.  No message left at [phone] ( no identifying information),  Invalid number at [phone].   Pt does not read MyChart messages.  Will send a postal letter [facility] an [facility] that is convenient for her.  Pt is aware of SW availability.

## 2016-05-28 ENCOUNTER — Telehealth: Payer: Self-pay | Admitting: Internal Medicine

## 2016-05-28 NOTE — Telephone Encounter (Signed)
Lillette BoxerWalker, Christopher David, MD  Huntley EstelleFroehlich, Kirin Brandenburger C, RN      Caller: Unspecified (Today, 8:08 AM)                I agree with the advice given. Just to add, patient can take acetaminophen and diphenhydramine for cold symptoms during pregnancy.

## 2016-05-28 NOTE — Telephone Encounter (Signed)
Spoke to pt who reports cold sxs for 2 days.   C/o cough with thick white phlegm.   Reports she is out of her inahler and would like a refill.   She denies fever, chills, SOB/DOE, or chest pain.   Writer advised pt to come in for appt.   Pt declined, states she has family coming into town and doesn't have time.     Writer advised pt to get OTC medicine, rest, and fluids.   Pt says she can't afford OTC meds and would like rx for cold and cough.   Then pt tells Clinical research associatewriter that she is 8 months pregnant.   Writer advised pt to call her OBgyn.   Pt agreed with plan but was tearful.   Will notify team grouper as fyi and to advise if needed.

## 2016-05-28 NOTE — Telephone Encounter (Signed)
Ms. Chovanec callingKatrinka Blazing to report that she is experiencing chest cold w/thick green phlegm, wheezing.  No fever no vomiting (vomit yesterday).  These symptoms have been going on for 2 day(s)    Patient requesting call back? yes, asking if a nurse can call her then call a script into the [facility] - [place] - [address]    Phone number confirmed at [phone]

## 2016-05-30 ENCOUNTER — Other Ambulatory Visit: Payer: Self-pay | Admitting: Internal Medicine

## 2016-05-30 DIAGNOSIS — J45909 Unspecified asthma, uncomplicated: Secondary | ICD-10-CM

## 2016-06-01 ENCOUNTER — Telehealth: Payer: Self-pay

## 2016-06-01 ENCOUNTER — Ambulatory Visit: Payer: Medicaid (Managed Care) | Admitting: Obstetrics and Gynecology

## 2016-06-01 ENCOUNTER — Ambulatory Visit: Payer: Medicaid (Managed Care)

## 2016-06-01 NOTE — Telephone Encounter (Signed)
[facility]. Attempted to call patient for rescheduling but her line was not accepting calls. Will send letter asking that patient call office to reschedule missed appointments.

## 2016-06-02 ENCOUNTER — Telehealth: Payer: Self-pay

## 2016-06-02 ENCOUNTER — Observation Stay
Admission: AD | Admit: 2016-06-02 | Discharge: 2016-06-02 | Disposition: A | Discharge status: Home / Self Care | Payer: Medicaid (Managed Care) | Attending: Obstetrics and Gynecology | Admitting: Obstetrics and Gynecology

## 2016-06-02 DIAGNOSIS — R059 Cough, unspecified: Secondary | ICD-10-CM | POA: Diagnosis present

## 2016-06-02 DIAGNOSIS — M545 Low back pain: Secondary | ICD-10-CM | POA: Insufficient documentation

## 2016-06-02 DIAGNOSIS — J45909 Unspecified asthma, uncomplicated: Secondary | ICD-10-CM

## 2016-06-02 DIAGNOSIS — Z3A32 32 weeks gestation of pregnancy: Secondary | ICD-10-CM | POA: Insufficient documentation

## 2016-06-02 DIAGNOSIS — R05 Cough: Secondary | ICD-10-CM | POA: Insufficient documentation

## 2016-06-02 DIAGNOSIS — O0970 Supervision of high risk pregnancy due to social problems, unspecified trimester: Secondary | ICD-10-CM

## 2016-06-02 DIAGNOSIS — O26893 Other specified pregnancy related conditions, third trimester: Principal | ICD-10-CM | POA: Insufficient documentation

## 2016-06-02 LAB — DRUG SCREEN CHEMICAL DEPENDENCY, URINE
Amphetamine,UR: NEGATIVE
Benzodiazepinen,UR: NEGATIVE
Cocaine/Metab,UR: POSITIVE
Opiates,UR: NEGATIVE
THC Metabolite,UR: NEGATIVE

## 2016-06-02 LAB — INFLUENZA B PCR: Influenza B PCR: 0

## 2016-06-02 LAB — RSV PCR: RSV PCR: 0

## 2016-06-02 LAB — INFLUENZA A: Influenza A PCR: 0

## 2016-06-02 MED ORDER — ACETAMINOPHEN 500 MG PO TABS *I*
1000.0000 mg | ORAL_TABLET | Freq: Four times a day (QID) | ORAL | 1 refills | Status: DC | PRN
Start: 2016-06-02 — End: 2016-06-02

## 2016-06-02 MED ORDER — BENZONATATE 100 MG PO CAPS *I*
100.0000 mg | ORAL_CAPSULE | Freq: Three times a day (TID) | ORAL | 0 refills | Status: AC | PRN
Start: 2016-06-02 — End: 2016-07-02

## 2016-06-02 MED ORDER — BENZONATATE 100 MG PO CAPS *I*
100.0000 mg | ORAL_CAPSULE | Freq: Three times a day (TID) | ORAL | 0 refills | Status: DC | PRN
Start: 2016-06-02 — End: 2016-06-02

## 2016-06-02 MED ORDER — OSELTAMIVIR PHOSPHATE 75 MG PO CAPS *I*
75.0000 mg | ORAL_CAPSULE | Freq: Two times a day (BID) | ORAL | 0 refills | Status: DC
Start: 2016-06-02 — End: 2016-06-02

## 2016-06-02 MED ORDER — ALBUTEROL SULFATE HFA 108 (90 BASE) MCG/ACT IN AERS *I*
1.0000 | INHALATION_SPRAY | Freq: Four times a day (QID) | RESPIRATORY_TRACT | 5 refills | Status: DC | PRN
Start: 2016-06-02 — End: 2016-06-02

## 2016-06-02 MED ORDER — ALBUTEROL SULFATE HFA 108 (90 BASE) MCG/ACT IN AERS *I*
1.0000 | INHALATION_SPRAY | Freq: Four times a day (QID) | RESPIRATORY_TRACT | 5 refills | Status: DC | PRN
Start: 2016-06-02 — End: 2017-03-02

## 2016-06-02 MED ORDER — OSELTAMIVIR PHOSPHATE 75 MG PO CAPS *I*
75.0000 mg | ORAL_CAPSULE | Freq: Two times a day (BID) | ORAL | 0 refills | Status: AC
Start: 2016-06-02 — End: 2016-06-12

## 2016-06-02 MED ORDER — ACETAMINOPHEN 500 MG PO TABS *I*
1000.0000 mg | ORAL_TABLET | Freq: Four times a day (QID) | ORAL | 1 refills | Status: DC | PRN
Start: 2016-06-02 — End: 2016-10-18

## 2016-06-02 NOTE — Telephone Encounter (Signed)
With chart review, [facility].  [facility]  Writer attempted to reach pt by telephone numbers listed without success.  Will keep encounter open to attempt again at different time

## 2016-06-02 NOTE — Progress Notes (Signed)
10:53 AM Discharge paperwork provided, pt verbalized understanding. Pt discharged in stable condition. Pamala HurryAbigail K Rumi Kolodziej, RN

## 2016-06-02 NOTE — OB Triage Note (Signed)
10:00 AM Received pt here for coughing x2 days. Pt states household members have had same symptoms, including son diagnosed with flu and on meds. +FM per pt. Pt denies uc, LOF or VB. Abdomen palpates soft. VSS. Pt admits to cocaine and THC 2 days ago, "I relapsed". CNM Gee aware of pt. Viral swab and urine sent as ordered. Pamala HurryAbigail K Marshayla Mitschke, RN

## 2016-06-02 NOTE — Procedures (Signed)
Fetal Non-Stress Test  Patient: Jenna [name]  Age: 31 y.o.  Date of Birth: 10/21/1985  [id number]:female  MRN: [id number]  GA: 764w4d    Reason for exam: cough; triage protocol     Maternal   Heart Rate: 92 (06/02/16 1002)  BP: (!) 141/64 (06/02/16 0950)    Education Done:   External fetal monitoring: yes   Fetal kick counts: yes   NST: yes   VAS: no   Manual stimulation:no    Other NA    Fetal non-stress test for singleton pregnancy  Pre Procedure Diagnosis: Coughing  Post Procedure Diagnosis: NST (non-stress test) reactive            NST Start Time:  06/02/2016 9:37 AM            Uterine Irritability: No            Contractions:     NST Fetus A  Variability: moderate  FHR Baseline: 145  Decelerations: none   Accelerations: at least 15 BPM for 15 seconds  Stimulation: none  NST Interpretation: reactive    End Time:  06/02/2016 10:08 AM          Duration of test (min): 31  Location of NST Fetal Heart Tracing: Archived electronically in CPN  Reviewed with:  Elouise MunroeNM Gee  Interpreting Provider Recommendations:  No further fetal testing needed unless indicated.        Test performed By: Pamala HurryAbigail K Lezlie Ritchey, RN  06/02/2016 10:16 AM

## 2016-06-02 NOTE — OB Triage Note (Signed)
OBSTETRICS TRIAGE NOTE      Chief Complaint: cough for 2 days    HPI     Amarri Michaelson is a 31 y.o. [id number] at 88w4dby midtrimester ultrasound with pregnancy complicated by risks outlined below who presents with cough for the past 2 days.  Reports good fetal movement, denies LOF/VB/contractions.  Reports some lower back pain that is ongoing.  Denies headache, visual changes, RUQ or epigastric pain.  Her son was diagnosed with the flu and is on Tamiflu.  Admits THC and cocaine use 2 days ago.  Reports she is in counseling and has a mental health provider but has been stressed due to her Mother's chronic illness.  History of asthma, ran out of refills of her inhaler and reports she was using daily prior to running out.  Here with partner.    Pregnancy Risks     Smoking, THC, & Cocaine Use- UCDS neg 1/18  Hx LTCS x 3, SVB x1, & VAVD x1  BMI 50  Asthma  Depression  2nd child died of SIDS    Obstetrical History     OB History   Gravida Para Term Preterm AB Living   7 5 5  1 4    SAB TAB Ectopic Multiple Live Births    1   5      # Outcome Date GA Lbr Len/2nd Weight Sex Delivery Anes PTL Lv   7 Current            6 Term 10/27/12 319w0d3700 g (8 lb 2.5 oz) M CSLT Gen  LIV   5 Term 11/26/09 393w0d549 g (7 lb 13.2 oz) F CS-LTranv Spinal N LIV      Birth Comments: Repeat C/S, HTN   4 Term 08/27/07 42w48w0d00 4082 g (9 lb) M CS-LTranv EPI  LIV      Birth Comments: FTP, HTN   3 Term 06/23/06 40w044w0d0 3714 g (8 lb 3 oz) M Vag-Vacuum   DEC      Birth Comments: fetal distress, SIDS death at 5mont61month   2 Term 07/04/03 [redacted]w[redacted]d 8w0d3260 g (7 lb 3 oz) M Vag-Spont EPI  LIV      Birth Comments: preeclampsia   1 TAB               Obstetric Comments   Cystic Fibrosis screen, 08/12/09:  No mutation   Hemoglobin electrophoresis, 03/27/12:  Normal     PMH, PSH, SH, GYN History, Allergies, and Current Medications reviewed and updated in eRecord.     Review of Systems     Negative except as stated in HPI    Prenatal Labs     No results  for input(s): WBC, HGB, HCT, PLT in the last 168 hours.  No results for input(s): NA, K, CL, CO2, UN, CREAT, GFRC, GLU in the last 168 hours. No results for input(s): LD, URIC, ALT, AST, ALK, TB in the last 168 hours.   No results for input(s): UTPR, UCRR in the last 168 hours.  Urine spot P/C ratio: NA         Lab results: 03/25/16  1426 02/12/16  1727   ABO RH Blood Type A RH POS  --    Rubella IgG AB POSITIVE  --    Syphilis Screen Neg  --    HIV 1&2 ANTIGEN/ANTIBODY Nonreactive  --    HBV S Ag NEG  --    N. gonorrhoeae DNA Amplification  --  .  Lab Cancel   Lab Cancel   Chlamydia Plasmid DNA Amplification  --  .        Lab results: 03/18/16  1217   Glucose,50gm 1HR 121      No results for input(s): GL0, GL1H, GL2H, GL3H in the last 8760 hours.      Physical Exam     Vitals:    06/02/16 0938 06/02/16 0950 06/02/16 1002   BP: (!) 141/77 (!) 141/64    Pulse: 102 81 92   Resp: 22     Temp: 36 C (96.8 F)     TempSrc: Temporal     SpO2: 98%       General Appearance: healthy, alert and no distress  Mental Status: Alert and oriented x 3  HEENT: Normocephalic, atraumatic.  Cardiovascular: Regular rate and rhythm with no murmurs  Respiratory: Clear to auscultate  Uterus: Gravid.  36 weeks size.  Non-tender to palpation.  Extremities: edema 1+    Estimated Fetal Weight: not done     Presentation: cephalic by ultrasound    Fetal Monitoring:  Baseline: 135 bpm  Variability: Moderate (6-25 BPM)  Accelerations: Yes 15X15  Decelerations: None  Category: I  Toco: no cxns    Assessment & Plan     Alaa Mullally is a 31 y.o. [id number] at 24w4dwith pregnancy complicated by risks outlined above who presents with cough.    1. EFM: Reactive NST.   Category I fetal heart tracing.  2. Tocometer: No CTX observed  3. Labs/Swabs Collected: Flu & UCDS pending.  4. Plan: BP mildly elevated, likely related to being sick.  Encouraged to see PCP re asthma management, will provide refills on inhaler for now.  Reviewed OTC comfort measures for  cough/cold and back pain.  Rx to pharmacy for tessalon pearles, robitussin, tylenol, & tamiflu (10 days for prophylaxis for household exposure).  Keep next OB visit as scheduled.    AJorene Guest CNM

## 2016-06-03 NOTE — Telephone Encounter (Signed)
Encounter opened in error

## 2016-06-03 NOTE — Telephone Encounter (Signed)
Pt still has not made an appointment, writer attempted to contact her with both numbers.  No answer, no ability to leave message.

## 2016-06-07 LAB — COCAINE, URINE, CONFIRMATION: Confirm COC/METAB: POSITIVE

## 2016-06-08 NOTE — Telephone Encounter (Signed)
"   the person you are calling, can not accepts calls at this time. "

## 2016-06-10 ENCOUNTER — Telehealth: Payer: Self-pay

## 2016-06-10 NOTE — Telephone Encounter (Signed)
Bus passes mailed to pt's listed home address.    Bennie DallasSarah Cillian Gwinner,LMSW [phone]

## 2016-06-10 NOTE — Telephone Encounter (Signed)
Writer spoke with [facility].[place],  She will send the bus pass.

## 2016-06-10 NOTE — Telephone Encounter (Addendum)
Patient called Clinical research associatewriter to reschedule NOS.  Patient stated "my phone is dead I [facility] [employer] [facility]. Can I get a bus pass sent home?"  Writer routing to SW.

## 2016-06-14 NOTE — Telephone Encounter (Signed)
"  Person you are calling is not accepting calls right now"

## 2016-06-15 NOTE — Telephone Encounter (Signed)
Pt has been scheduled for 06/17/16, closing encounter.

## 2016-06-16 NOTE — Telephone Encounter (Signed)
Pt has appointment scheduled

## 2016-06-17 ENCOUNTER — Encounter: Payer: Medicaid (Managed Care) | Admitting: Obstetrics and Gynecology

## 2016-06-17 ENCOUNTER — Telehealth: Payer: Self-pay

## 2016-06-17 NOTE — Telephone Encounter (Signed)
Attempt made to contact pt as she NOS [facility] appointment. Per [facility] policy, letter was sent encouraging pt to contact [facility] ASAP to schedule an appointment that is convenient for her. SW services offered.

## 2016-06-21 ENCOUNTER — Telehealth: Payer: Self-pay

## 2016-06-21 NOTE — Telephone Encounter (Signed)
Patient calling because she missed appts and her due date is approaching.  Pt scheduled OBC for 4/12 @ 2:30pm, states she has received and saved bus passes that SW sent. Pt states concern with plan for induction, still does not have working phone, gave emergency number [phone].  Pt confirmed appt time for Thursday.

## 2016-06-24 ENCOUNTER — Encounter
Admission: AD | Disposition: A | Discharge status: Home / Self Care | Payer: Self-pay | Source: Ambulatory Visit | Attending: Obstetrics and Gynecology

## 2016-06-24 ENCOUNTER — Encounter: Payer: Self-pay | Admitting: Psychiatry

## 2016-06-24 ENCOUNTER — Inpatient Hospital Stay
Admission: AD | Admit: 2016-06-24 | Discharge: 2016-06-27 | DRG: 540 | Disposition: A | Discharge status: Home / Self Care | Payer: Medicaid (Managed Care) | Source: Ambulatory Visit | Attending: Obstetrics and Gynecology | Admitting: Obstetrics and Gynecology

## 2016-06-24 ENCOUNTER — Encounter: Payer: Self-pay | Admitting: Student in an Organized Health Care Education/Training Program

## 2016-06-24 ENCOUNTER — Encounter: Payer: Self-pay | Admitting: Obstetrics and Gynecology

## 2016-06-24 ENCOUNTER — Inpatient Hospital Stay: Payer: Medicaid (Managed Care) | Admitting: Anesthesiology

## 2016-06-24 ENCOUNTER — Ambulatory Visit
Payer: Medicaid (Managed Care) | Attending: Student in an Organized Health Care Education/Training Program | Admitting: Student in an Organized Health Care Education/Training Program

## 2016-06-24 VITALS — BP 160/100 | HR 80 | Ht 65.0 in | Wt 314.0 lb

## 2016-06-24 DIAGNOSIS — N838 Other noninflammatory disorders of ovary, fallopian tube and broad ligament: Secondary | ICD-10-CM | POA: Diagnosis present

## 2016-06-24 DIAGNOSIS — O99214 Obesity complicating childbirth: Secondary | ICD-10-CM | POA: Diagnosis present

## 2016-06-24 DIAGNOSIS — O1403 Mild to moderate pre-eclampsia, third trimester: Secondary | ICD-10-CM

## 2016-06-24 DIAGNOSIS — Z6841 Body Mass Index (BMI) 40.0 and over, adult: Secondary | ICD-10-CM

## 2016-06-24 DIAGNOSIS — O99324 Drug use complicating childbirth: Secondary | ICD-10-CM | POA: Diagnosis present

## 2016-06-24 DIAGNOSIS — O0972 Supervision of high risk pregnancy due to social problems, second trimester: Secondary | ICD-10-CM

## 2016-06-24 DIAGNOSIS — O9952 Diseases of the respiratory system complicating childbirth: Secondary | ICD-10-CM | POA: Diagnosis present

## 2016-06-24 DIAGNOSIS — Z3A39 39 weeks gestation of pregnancy: Secondary | ICD-10-CM

## 2016-06-24 DIAGNOSIS — J45909 Unspecified asthma, uncomplicated: Secondary | ICD-10-CM | POA: Diagnosis present

## 2016-06-24 DIAGNOSIS — Z8759 Personal history of other complications of pregnancy, childbirth and the puerperium: Secondary | ICD-10-CM | POA: Diagnosis present

## 2016-06-24 DIAGNOSIS — F329 Major depressive disorder, single episode, unspecified: Secondary | ICD-10-CM | POA: Diagnosis present

## 2016-06-24 DIAGNOSIS — O34211 Maternal care for low transverse scar from previous cesarean delivery: Secondary | ICD-10-CM | POA: Diagnosis present

## 2016-06-24 DIAGNOSIS — O99344 Other mental disorders complicating childbirth: Secondary | ICD-10-CM | POA: Diagnosis present

## 2016-06-24 DIAGNOSIS — Z3490 Encounter for supervision of normal pregnancy, unspecified, unspecified trimester: Secondary | ICD-10-CM

## 2016-06-24 DIAGNOSIS — F1410 Cocaine abuse, uncomplicated: Secondary | ICD-10-CM | POA: Diagnosis present

## 2016-06-24 DIAGNOSIS — O134 Gestational [pregnancy-induced] hypertension without significant proteinuria, complicating childbirth: Principal | ICD-10-CM | POA: Diagnosis present

## 2016-06-24 LAB — CHEMICAL DEPENDENCY SCREEN 8, URINE
Amphetamine,UR: NEGATIVE
Barbiturate,UR: NEGATIVE
Benzodiazepinen,UR: NEGATIVE
Cocaine/Metab,UR: NEGATIVE
Methadone Metab,UR: NEGATIVE
Opiates,UR: NEGATIVE
PCP,UR: NEGATIVE
Propoxyphene,UR: NEGATIVE
THC Metabolite,UR: NEGATIVE

## 2016-06-24 LAB — COMPREHENSIVE METABOLIC PANEL
ALT: 12 U/L (ref 0–35)
AST: 16 U/L (ref 0–35)
Albumin: 3.8 g/dL (ref 3.5–5.2)
Alk Phos: 148 U/L — ABNORMAL HIGH (ref 35–105)
Anion Gap: 18 — ABNORMAL HIGH (ref 7–16)
Bilirubin,Total: 0.3 mg/dL (ref 0.0–1.2)
CO2: 19 mmol/L — ABNORMAL LOW (ref 20–28)
Calcium: 9.3 mg/dL (ref 8.8–10.2)
Chloride: 101 mmol/L (ref 96–108)
Creatinine: 0.63 mg/dL (ref 0.51–0.95)
GFR,Black: 139 *
GFR,Caucasian: 120 *
Glucose: 67 mg/dL (ref 60–99)
Lab: 8 mg/dL (ref 6–20)
Potassium: 4.2 mmol/L (ref 3.3–5.1)
Sodium: 138 mmol/L (ref 133–145)
Total Protein: 6.8 g/dL (ref 6.3–7.7)

## 2016-06-24 LAB — CREATININE, URINE: Creatinine,UR: 106 mg/dL (ref 20–300)

## 2016-06-24 LAB — POCT URINALYSIS DIPSTICK
Blood,UA POCT: NEGATIVE
Glucose,UA POCT: NORMAL mg/dL
Ketones,UA POCT: NEGATIVE mg/dL
Lot #: 26987704
Nitrite,UA POCT: NEGATIVE
PH,UA POCT: 7 (ref 5–8)
Protein,UA POCT: NEGATIVE mg/dL

## 2016-06-24 LAB — CBC
Hematocrit: 30 % — ABNORMAL LOW (ref 34–45)
Hemoglobin: 9.5 g/dL — ABNORMAL LOW (ref 11.2–15.7)
MCH: 27 pg/cell (ref 26–32)
MCHC: 32 g/dL (ref 32–36)
MCV: 85 fL (ref 79–95)
Platelets: 253 10*3/uL (ref 160–370)
RBC: 3.5 MIL/uL — ABNORMAL LOW (ref 3.9–5.2)
RDW: 14.3 % (ref 11.7–14.4)
WBC: 11.3 10*3/uL — ABNORMAL HIGH (ref 4.0–10.0)

## 2016-06-24 LAB — TYPE AND SCREEN
ABO RH Blood Type: A POS
Antibody Screen: NEGATIVE

## 2016-06-24 LAB — PROTEIN, URINE: Protein,UR: 12 mg/dL — ABNORMAL HIGH (ref 0–11)

## 2016-06-24 LAB — TP/CREATININE RATIO,UR: TP Creatinine ratio,UR: 0.11

## 2016-06-24 SURGERY — Surgical Case
Anesthesia: General | Site: Abdomen | Wound class: Clean Contaminated

## 2016-06-24 MED ORDER — HYDROMORPHONE PCA 1 MG/ML *WRAPPED*
INTRAMUSCULAR | Status: DC
Start: 2016-06-24 — End: 2016-06-25

## 2016-06-24 MED ORDER — SOD CITRATE-CITRIC ACID 500-334 MG/5ML PO SOLN *I*
ORAL | Status: DC | PRN
Start: 2016-06-24 — End: 2016-06-24
  Administered 2016-06-24: 30 mL via ORAL

## 2016-06-24 MED ORDER — SUCCINYLCHOLINE CHLORIDE 20 MG/ML IV/IJ SOLN *WRAPPED*
Status: DC | PRN
Start: 2016-06-24 — End: 2016-06-24
  Administered 2016-06-24: 200 mg via INTRAVENOUS

## 2016-06-24 MED ORDER — PROMETHAZINE HCL 25 MG/ML IJ SOLN *I*
6.2500 mg | Freq: Once | INTRAMUSCULAR | Status: AC | PRN
Start: 2016-06-24 — End: 2016-06-24

## 2016-06-24 MED ORDER — HYDROMORPHONE HCL PF 1 MG/ML IJ SOLN *WRAPPED*
INTRAMUSCULAR | Status: AC
Start: 2016-06-24 — End: 2016-06-24
  Administered 2016-06-24: 0.5 mg via INTRAVENOUS
  Filled 2016-06-24: qty 1

## 2016-06-24 MED ORDER — CALCIUM GLUCONATE 4.7 MEQ (1,000 MG) IN 50 ML WRAPPED *I*
4.7000 meq | INTRAVENOUS | Status: DC | PRN
Start: 2016-06-24 — End: 2016-06-25

## 2016-06-24 MED ORDER — PROPOFOL 10 MG/ML IV EMUL (INTERMITTENT DOSING) WRAPPED *I*
INTRAVENOUS | Status: DC | PRN
Start: 2016-06-24 — End: 2016-06-24
  Administered 2016-06-24: 200 mg via INTRAVENOUS

## 2016-06-24 MED ORDER — MAGNESIUM SULFATE 20G IN 500ML LACTATED RINGERS *I*
2.0000 g/h | INTRAVENOUS | Status: DC
Start: 2016-06-24 — End: 2016-06-25
  Administered 2016-06-24 – 2016-06-25 (×14): 2 g/h via INTRAVENOUS

## 2016-06-24 MED ORDER — LACTATED RINGERS IV SOLN *I*
125.0000 mL/h | INTRAVENOUS | Status: DC
Start: 2016-06-24 — End: 2016-06-24

## 2016-06-24 MED ORDER — KETOROLAC TROMETHAMINE 30 MG/ML IJ SOLN *I*
INTRAMUSCULAR | Status: AC
Start: 2016-06-24 — End: 2016-06-24
  Administered 2016-06-24: 30 mg via INTRAVENOUS
  Filled 2016-06-24: qty 1

## 2016-06-24 MED ORDER — NALOXONE HCL 0.4 MG/ML IJ SOLN *WRAPPED*
0.1000 mg | Status: DC | PRN
Start: 2016-06-24 — End: 2016-06-27

## 2016-06-24 MED ORDER — LIDOCAINE HCL 1 % IJ SOLN *I*
20.0000 mL | Freq: Once | INTRAMUSCULAR | Status: DC
Start: 2016-06-24 — End: 2016-06-24

## 2016-06-24 MED ORDER — PROMETHAZINE HCL 25 MG/ML IJ SOLN *I*
12.5000 mg | Freq: Four times a day (QID) | INTRAMUSCULAR | Status: DC | PRN
Start: 2016-06-24 — End: 2016-06-27

## 2016-06-24 MED ORDER — FENTANYL CITRATE 50 MCG/ML IJ SOLN *WRAPPED*
INTRAMUSCULAR | Status: AC
Start: 2016-06-24 — End: 2016-06-24
  Filled 2016-06-24: qty 4

## 2016-06-24 MED ORDER — ONDANSETRON HCL 2 MG/ML IV SOLN *I*
4.0000 mg | Freq: Four times a day (QID) | INTRAMUSCULAR | Status: DC | PRN
Start: 2016-06-24 — End: 2016-06-27

## 2016-06-24 MED ORDER — HEPARIN SODIUM (PORCINE) 10,000 UNIT/ML IJ SOLN *I*
7500.0000 [IU] | Freq: Three times a day (TID) | INTRAMUSCULAR | Status: DC
Start: 2016-06-25 — End: 2016-06-27
  Administered 2016-06-25 – 2016-06-27 (×7): 7500 [IU] via SUBCUTANEOUS
  Filled 2016-06-24 (×10): qty 0.75

## 2016-06-24 MED ORDER — MAGNESIUM SULFATE 20G IN 500ML LACTATED RINGERS *I*
4.0000 g | Freq: Once | INTRAVENOUS | Status: DC
Start: 2016-06-24 — End: 2016-06-24
  Administered 2016-06-24: 4 g via INTRAVENOUS

## 2016-06-24 MED ORDER — DOCUSATE SODIUM 100 MG PO CAPS *I*
100.0000 mg | ORAL_CAPSULE | Freq: Two times a day (BID) | ORAL | Status: DC
Start: 2016-06-24 — End: 2016-06-27
  Administered 2016-06-25 – 2016-06-27 (×5): 100 mg via ORAL
  Filled 2016-06-24 (×5): qty 1

## 2016-06-24 MED ORDER — OXYTOCIN 10 UNIT/ML IJ SOLN *I*
INTRAMUSCULAR | Status: DC | PRN
Start: 2016-06-24 — End: 2016-06-24
  Administered 2016-06-24: 10 [IU] via INTRAMUSCULAR

## 2016-06-24 MED ORDER — CEFAZOLIN SODIUM 1000 MG IJ SOLR *I*
INTRAMUSCULAR | Status: DC | PRN
Start: 2016-06-24 — End: 2016-06-24
  Administered 2016-06-24: 3 g via INTRAVENOUS

## 2016-06-24 MED ORDER — HYDROMORPHONE PCA 1 MG/ML *WRAPPED*
INTRAMUSCULAR | Status: AC
Start: 2016-06-24 — End: 2016-06-24
  Filled 2016-06-24: qty 25

## 2016-06-24 MED ORDER — OXYTOCIN 30 UNITS IN 500ML NS WRAPPED *I*
INTRAMUSCULAR | Status: DC | PRN
Start: 2016-06-24 — End: 2016-06-24
  Administered 2016-06-24 (×2): 200 mL via INTRAVENOUS

## 2016-06-24 MED ORDER — SODIUM CHLORIDE 0.9 % IV SOLN WRAPPED *I*
3000.0000 mg | Freq: Once | INTRAMUSCULAR | Status: AC
Start: 2016-06-24 — End: 2016-06-24
  Administered 2016-06-24: 3000 mg via INTRAVENOUS
  Filled 2016-06-24: qty 30

## 2016-06-24 MED ORDER — FENTANYL CITRATE 50 MCG/ML IJ SOLN *WRAPPED*
INTRAMUSCULAR | Status: DC | PRN
Start: 2016-06-24 — End: 2016-06-24
  Administered 2016-06-24 (×3): 50 ug via INTRAVENOUS
  Administered 2016-06-24: 100 ug via INTRAVENOUS
  Administered 2016-06-24 (×3): 50 ug via INTRAVENOUS

## 2016-06-24 MED ORDER — IBUPROFEN 600 MG PO TABS *I*
600.0000 mg | ORAL_TABLET | Freq: Four times a day (QID) | ORAL | Status: DC | PRN
Start: 2016-06-25 — End: 2016-06-26
  Administered 2016-06-25 – 2016-06-26 (×3): 600 mg via ORAL
  Filled 2016-06-24 (×3): qty 1

## 2016-06-24 MED ORDER — HYDROMORPHONE HCL PF 1 MG/ML IJ SOLN *WRAPPED*
0.5000 mg | INTRAMUSCULAR | Status: DC | PRN
Start: 2016-06-24 — End: 2016-06-27
  Administered 2016-06-24: 0.5 mg via INTRAVENOUS

## 2016-06-24 MED ORDER — CEFAZOLIN SODIUM 1000 MG IJ SOLR *I*
3000.0000 mg | Freq: Three times a day (TID) | INTRAMUSCULAR | Status: DC
Start: 2016-06-24 — End: 2016-06-24

## 2016-06-24 MED ORDER — FENTANYL CITRATE 50 MCG/ML IJ SOLN *WRAPPED*
INTRAMUSCULAR | Status: AC
Start: 2016-06-24 — End: 2016-06-24
  Filled 2016-06-24: qty 2

## 2016-06-24 MED ORDER — OXYTOCIN 30 UNITS/500 ML NS *POSTPARTUM* *I*
100.0000 m[IU]/min | INTRAMUSCULAR | Status: DC
Start: 2016-06-24 — End: 2016-06-24

## 2016-06-24 MED ORDER — SIMETHICONE 80 MG PO CHEW *I*
80.0000 mg | CHEWABLE_TABLET | Freq: Four times a day (QID) | ORAL | Status: DC | PRN
Start: 2016-06-24 — End: 2016-06-27
  Administered 2016-06-25 – 2016-06-27 (×2): 80 mg via ORAL
  Filled 2016-06-24 (×2): qty 1

## 2016-06-24 MED ORDER — OXYTOCIN 30 UNITS/500 ML NS *POSTPARTUM* *I*
100.0000 m[IU]/min | INTRAMUSCULAR | Status: DC
Start: 2016-06-24 — End: 2016-06-27
  Administered 2016-06-24: 100 m[IU]/min via INTRAVENOUS

## 2016-06-24 MED ORDER — FERROUS SULFATE 325 (65 FE) MG PO TABS *WRAPPED* *I*
325.0000 mg | ORAL_TABLET | Freq: Two times a day (BID) | ORAL | Status: DC
Start: 2016-06-25 — End: 2016-06-27
  Administered 2016-06-25 – 2016-06-27 (×5): 325 mg via ORAL
  Filled 2016-06-24 (×6): qty 1

## 2016-06-24 MED ORDER — LACTATED RINGERS IV SOLN *I*
50.0000 mL/h | INTRAVENOUS | Status: AC
Start: 2016-06-24 — End: 2016-06-25
  Administered 2016-06-24 – 2016-06-25 (×3): 50 mL/h via INTRAVENOUS
  Administered 2016-06-25: 500 mL/h via INTRAVENOUS
  Administered 2016-06-25 (×5): 50 mL/h via INTRAVENOUS

## 2016-06-24 MED ORDER — TETANUS-DIPHTH-ACELL PERT 5-2.5-18.5 LF-MCG/0.5 IM SUSP *WRAPPED*
0.5000 mL | Freq: Once | INTRAMUSCULAR | Status: AC
Start: 2016-06-24 — End: 2016-06-25
  Administered 2016-06-25: 0.5 mL via INTRAMUSCULAR
  Filled 2016-06-24: qty 0.5

## 2016-06-24 MED ORDER — KETOROLAC TROMETHAMINE 30 MG/ML IJ SOLN *I*
30.0000 mg | Freq: Four times a day (QID) | INTRAMUSCULAR | Status: AC
Start: 2016-06-24 — End: 2016-06-25
  Administered 2016-06-25 (×3): 30 mg via INTRAVENOUS
  Filled 2016-06-24 (×3): qty 1

## 2016-06-24 MED ORDER — ALBUTEROL SULFATE (2.5 MG/3ML) 0.083% IN NEBU *I*
2.5000 mg | INHALATION_SOLUTION | Freq: Once | RESPIRATORY_TRACT | Status: AC | PRN
Start: 2016-06-24 — End: 2016-06-24

## 2016-06-24 MED ORDER — ONDANSETRON HCL 2 MG/ML IV SOLN *I*
INTRAMUSCULAR | Status: DC | PRN
Start: 2016-06-24 — End: 2016-06-24
  Administered 2016-06-24: 4 mg via INTRAMUSCULAR

## 2016-06-24 MED ORDER — DIPHENHYDRAMINE HCL 25 MG PO TABS *I*
25.0000 mg | ORAL_TABLET | Freq: Every evening | ORAL | Status: DC | PRN
Start: 2016-06-24 — End: 2016-06-25

## 2016-06-24 MED ORDER — SODIUM CHLORIDE 0.9 % IV SOLN WRAPPED *I*
3000.0000 mg | INTRAMUSCULAR | Status: DC
Start: 2016-06-24 — End: 2016-06-24
  Filled 2016-06-24: qty 30

## 2016-06-24 SURGICAL SUPPLY — 6 items
DRESSING ABDOMINAL PAD 5 X 9 LF (Dressing) ×2 IMPLANT
DRESSING TEGADERM 4 X 10IN (Dressing) ×4 IMPLANT
DRESSING TELFA ISLAND 4 IN X5 IN (Dressing) ×2 IMPLANT
RETRACTOR ALEXIS O C-SECTION XLG (Supply) ×2 IMPLANT
RETRACTOR TRAXI PANNUS (Other) ×2 IMPLANT
SPONGE SURGICEL ABS 4 X 8IN (Sponge) ×2 IMPLANT

## 2016-06-24 NOTE — Anesthesia Procedure Notes (Signed)
---------------------------------------------------------------------------------------------------------------------------------------    AIRWAY   GENERAL INFORMATION AND STAFF    Patient location during procedure: OR       Date of Procedure: 06/24/2016 8:10 PM  CONDITION PRIOR TO MANIPULATION     Current Airway/Neck Condition:  Intubated        For more airway physical exam details, see Anesthesia PreOp Evaluation  AIRWAY METHOD     Patient Position:  Sniffing    Preoxygenated: yes      Induction: IV, Succinylcholine and NDMR  Mask Difficulty Assessment:  0 - not attempted      Technique Used for Successful ETT Placement:  Direct laryngoscopy    Devices/Methods Used in Placement:  Intubating stylet    Blade Type:  Miller    Laryngoscope Blade/Video laryngoscope Blade Size:  2    Cormack-Lehane Classification:  Grade I - full view of glottis    Placement Verified by: capnometry, auscultation and equal breath sounds      Number of Attempts at Approach:  1    Number of Other Approaches Attempted:  0  FINAL AIRWAY DETAILS    Final Airway Type:  Endotracheal airway    Adjunct Airway: soft bite block    Final Endotracheal Airway:  ETT      Cuffed: cuffed    Insertion Site:  Oral    ETT Size (mm):  6.5    Distance inserted from Lips (cm):  20  ----------------------------------------------------------------------------------------------------------------------------------------

## 2016-06-24 NOTE — Anesthesia Postprocedure Evaluation (Addendum)
Anesthesia Post-Op Note    Patient: Jenna [name]    Procedure(s) Performed:  Procedure Summary     Date Anesthesia Start Anesthesia Stop Room / Location    06/24/16 1913 2123 [id number] / [facility] L&D       Procedure Diagnosis Surgeon Attending Anesthesia    C-SECTION (N/A Abdomen) No diagnosis on file. Dawna Part, MD Lavone Neri, DO        Recovery Vitals  BP: 133/75 (06/24/2016 10:24 PM)  Heart Rate: 82 (06/24/2016 10:24 PM)  Heart Rate (via Pulse Ox): 84 (06/24/2016 10:24 PM)  Resp: 19 (06/24/2016 10:24 PM)  Temp: 36.9 C (98.4 F) (06/24/2016 10:00 PM)  SpO2: 100 % (06/24/2016 10:24 PM)  O2 Flow Rate: 2 L/min (06/24/2016 10:24 PM)   0-10 Scale: 10 (06/24/2016 10:24 PM)  Anesthesia type:  General  Complications Noted During Procedure or in PACU:  None   Comment:    Patient Location:  Patient location: The patient is not available on [phone] at the time of this attending postanesthesia visit. Her nurse, Lawson Fiscal, tells me that the patient is recovering well and walking normally. -Dr. Evonnie Dawes.  Post-procedure mental status: see above.  Note status: see above.  Temperature Status:    Normothermic  Oxygen Saturation:    Within patient's normal range  Cardiac Status:   Within patient's normal range  Fluid Status:    Stable  Airway Patency:     Yes  Pulmonary Status:    Baseline  Pain control: see above.  Nausea and Vomiting:    PONV: see above.    Post Op Assessment:    Tolerated procedure well   Attending Attestation:  All indicated post anesthesia care provided     -

## 2016-06-24 NOTE — H&P (Signed)
UPDATES TO PATIENT'S CONDITION on the DAY OF SURGERY/PROCEDURE    I. Updates to Patient's Condition (to be completed by a provider privileged to complete a H&P, following reassessment of the patient by the provider):    Day of Surgery/Procedure Update:  History  Full H&P done today; no updates needed.    Physical  Full H&P done today; no updates needed.            II. Procedure Readiness   I have reviewed the patient's H&P and updated condition. By completing and signing this form, I attest that this patient is ready for surgery/procedure.    III. Attestation   I have reviewed the updated information regarding the patient's condition and it is appropriate to proceed with the planned surgery/procedure.    Arthur Holms, MD as of 6:29 PM 06/24/2016
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
## 2016-06-24 NOTE — Anesthesia Case Conclusion (Signed)
CASE CONCLUSION  Emergence  Actions:  Suctioned, soft bite block, extubated and mask supported  Criteria Used for Airway Removal:  Adequate Tv & RR and acceptable O2 saturation  Assessment:  Routine  Transport  Directly to: PACU  Airway:  Nasal cannula  Oxygen Delivery:  4 lpm  Position:  Recumbent  Patient Condition on Handoff  Level of Consciousness:  Mildly sedated  Patient Condition:  Stable  Handoff Report to:  RN

## 2016-06-24 NOTE — Anesthesia Preprocedure Evaluation (Addendum)
Anesthesia Pre-operative History and Physical for Jenna [name]    Highlighted Issues for this Procedure:  Jenna [name] is a 31 yo female with a PMH of cocaine abuse, morbid obesity, tobacco abuse, asthma, and prior LTCS x3 who presents for LTCS at [redacted]w[redacted]d. Prior LTCS spinal unable to be placed by primary team requiring conversion to GETA.       Marland Kitchen  Anesthesia Evaluation Information Source: patient     ANESTHESIA HISTORY  Pertinent(-):  No History of anesthetic complications or Family hx of anesthetic complications    GENERAL    + Obesity          central, lower body    + Substance abuse          cocaine    HEENT  Pertinent (-):  No TMJD, anatomic issues or neck pain PULMONARY    + Smoker          tobacco advised to quit    + Asthma    + Shortness of Breath  Pertinent(-):  No recent URI or cough/congestion    CARDIOVASCULAR  Poor<4 METS Exercise Tolerance    + Hypertension          poorly controlled  Pertinent(-):  No hx of DVT    GI/HEPATIC/RENAL  Last PO Intake: Enter Last PO Intake in ROS/Med Hx Tab    + GERD          gestational  Pertinent(-):  No nausea, vomiting, alcohol use, liver  issues or renal issues NEURO/PSYCH    + Headaches          migraines    + Chronic pain          lower back    + Psychiatric Issues          depression  Pertinent(-):  No seizures or cerebrovascular event    ENDO/OTHER  Pertinent(-):  No diabetes mellitus    HEMATOLOGIC  Pertinent(-):  No coagulopathy, anticoagulants/antiplatelet medications, blood transfusion or autoimmune disease       Physical Exam    Airway            Mouth opening: normal            Mallampati: III            TM distance (fb): >3 FB            Neck ROM: full  Dental   Normal Exam   Cardiovascular           Rhythm: regular           Rate: normal         Pulmonary     breath sounds clear to auscultation    Mental Status     oriented to person, place and time         ________________________________________________________________________  PLAN  ASA Score   3  Anesthetic Plan general     Induction (RSI) General Anesthesia/Sedation Maintenance Plan (inhaled agents and neuromuscular blockade for intubation only);  Airway Manipulation (direct laryngoscopy); Airway (cuffed ETT); Line ( use current access); Monitoring (standard ASA); Positioning (left uterine tilt); PONV Plan (ondansetron); Pain (per surgical team and intraop local); PostOp (PACU)    Informed Consent     Risks:          Risks discussed were commensurate with the plan listed above with the following specific points: N/V, aspiration, sore throat, hypotension, headache, failed block and infection , damage to:(eyes, nerves, teeth, blood vessels), allergic  Rx, unexpected serious injury    Anesthetic Consent:         Anesthetic plan (and risks as noted above) were discussed with patient    Blood products Consent:        Use of blood products discussed with: patient and they consented    Plan also discussed with team members including:       attending and resident    Attending Attestation:  As the primary attending anesthesiologist, I attest that the patient or proxy understands and accepts the risks and benefits of the anesthesia plan. I also attest that I have personally performed a pre-anesthetic examination and evaluation, and prescribed the anesthetic plan for this particular location within 48 hours prior to the anesthetic as documented. Uliana Brinker, DO 9:03 PM

## 2016-06-24 NOTE — Op Note (Addendum)
OPERATIVE NOTE    Date of Surgery:  06/24/2016  Surgeon:  Arthur Holms, MD  First Assistant:  Kathee Polite, MD Resident    Pre-Op Diagnosis:    1. History of prior LTCS x 3  2. Severe gestational hypertension  3. Morbid obesity  4. History of polysubstance abuse and recent cocaine use    Anesthesia Type:  General    Medications received in the OR:  Ancef 3g IV prior to skin incision for surgical prophylaxis  IV pitocin 30 mU/500 mL  Intrauterine IM pitocin 30 mU  See anesthesia record for further medications administered    Post-Op Diagnosis:  1. History of prior LTCS x 3  2. Severe gestational hypertension  3. Morbid obesity  4. History of polysubstance abuse and recent cocaine use  5. Dense fascial and intraabdominal adhesions  6. 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 Complex right fallopian tube fimbrial cyst    Procedure(s) Performed:  1. Bilateral Maylard incisions  2. Low transverse c-section with T extension  3. Excision of right fallopian tube fimbrial cyst    Estimated Blood Loss:  2,000 cc  Packing:  No  Drains:  No  Fluid Totals: see anesthesia record  Specimens to Pathology:  Yes placenta, right fallopian tube fimbrial cyst  Patient Condition:  good    Indications:   Patient is a 31 y.o. [id number] female with history of 3 prior c-sections at term who was admitted at [redacted]w[redacted]d with severe gestational hypertension and underwent repeat c-section delivery.     Operative Findings:  Normal appearing external genitalia with groin hidradenitis scarring noted but no abscesses or masses. Dense fascial adhesions to the rectus abdominus muscles. Dense adhesions between bellies of rectus abdominus muscles at the midline. Thickening of the peritoneum. Filmy adhesion of the bladder to the anterior left uterine serosa. Omental adhesions to the fundus and midline anterior serosa. Normal appearing left and right ovaries. Right fallopian tube with complex-appearing fimbrial cyst and simple appearing paratubal  cyst. Normal left fallopian tube.     Description of Procedure:   The patient was taken to the operating room and placed on the operating room table. A stop check was performed confirming the patient and the procedure to be completed. Fetal heart tones were noted to be in the 140s prior to walking back to OR.  The pubic hair was clipped near the intended Pfannenstiel incision.  A foley catheter was inserted using sterile technique, the patient's perineum and vagina were prepped with chlorhexidine. A traxi pannus retractor was placed. The patient's abdomen was prepped with chloraprep which was allowed to fully dry for 3 minutes prior to draping in the standard fashion. The patient was positioned in the left lateral tilt position.  The surgical pause was completed. NICU team was assembled. General anesthesia was administered without difficulty.    A low transverse Pfannenstiel skin incision was made and carried down sharply to the fascia. The fascia was nicked in the midline and the incision was extended transversely. The fascia was dissected off the rectus muscle using sharp and blunt dissection. Dense facial adhesions were noted as described above. The rectus muscles were transected bilaterally with Maylard incisions using electrocautery. The peritoneum was isolated and incised. The incision was extended superiorly and inferiorly with good visualization of the bladder. An alexis retractor was placed. The bladder blade was placed to protect the bladder. Omental adhesions to the anterior uterine serosa were cauterized.    A low transverse uterine incision was  made and extended transversely by cephalo-caudad stretching. The bandage scissors were used to extend the incision further laterally with care taken to avoid uterine vessels.  The amniotic fluid was noted to be clear at the time of rupture. Vacuum was applied to fetal vertex to assist in delivery. Fundal pressure was applied with gentle traction on the vacuum  with a single pop off. The uterine incision was extended cephalad in a T extension. The vacuum was reapplied to fetal vertex and head was delivered with gentle traction on the vacuum and fundal pressure. There was delivery of a viable female infant in the LOA position. The infant's mouth and nares were bulb suctioned on the abdomen. The infant's cord was clamped and cut, and the infant was passed to the awaiting personnel. The placenta was then delivered manually.     The uterus was unable to be exteriorized. The uterine cavity was cleared of all clots and debris. Uterine atony was encountered. Uterine massage was performed and intrauterine pitocin administered with improvement in uterine tone. Omental adhesions to the fundus were cauterized. The uterine incision was re-approximated with interlocking suture of 0 Vicryl. The T aspect of the hysterotomy extension was incorporated into the transverse hysterotomy closure.  A second imbricating layer of 0 Vicryl was placed. A single figure of eight stitch with 0 vicryl was used to achieve hemostasis at the midline of the hysterotomy.    The uterus was inspected with good hemostasis noted. The abdominal cavity was carefully inspected for any bleeding. Good hemostasis was again noted. A wound sweep was performed noting no retained instruments or sponges. The right fallopian tube was noted to have a complex-appearing fimbrial cyst which was clamped, tied with 0 vicryl, cut and removed. The specimen was sent to pathology.    The bladder was backfilled with 300cc of normal saline containing sodium fluorescein and was noted to be intact. Surgicel was applied to the hysterotomy.     Attention was then turned to the fascia, which was re-approximated with a running suture of 0 Vicryl. The subcutaneous layer was hemostatic. The subcutaneous layer was re-approximated with interrupted suture of 3-0 Vicryl. The skin was re-approximated with monocryl. A sterile dressing was applied.      The patient tolerated the procedure well, anesthesia was reversed and she was taken to the Post Anesthesia Care Unit in stable and satisfactory condition.   All sponge, needle and instrument counts were correct at the end of the procedure x2. The foley was draining yellow urine at the end of the procedure.     Dr. Burnadette Peter was present for the entire procedure.    Kathee Polite, MD  Obstetrics and Gynecology R3  Pager [phone]

## 2016-06-24 NOTE — Progress Notes (Signed)
Jenna [name] is in a fairly good mood today.  She continues to attend groups 3x week at [facility] and she finds them very helpful.  She has a number of psychosocial stressors currently including her mother, who has advanced ALS, will be moving in with her as she now requires 24 hr care. We discussed the pros and cons of this, especially in light of her upcoming delivery, and Jenna [name] is currently in jail but he is to be released tomorrow.  She no longer is carrying for her sister's children so that has been some relief.  Jenna [name] denies any clear symptoms of depression at this time however I told her, given her history, she may want to consider restarting her medications after delivery.  She said she would think about it.  I will continue to follow.

## 2016-06-24 NOTE — Progress Notes (Signed)
Patient continues to c/o pain to uterus when checking fundal assessments #5 on pain scale of 1-10 but tolerable. PCA Dilaudid started for pain management. Denies any nausea at this time. Tolerates ice chips well. No bleeding noted. Fundal assessments done every 15 minutes as per protocol. No bleeding noted. Patient awake alert and oriented x 3. Patient meets PACU discharge criteria. Patient transfer to 314 via stretcher accompanied by RN and transport team. Report given to Melissa Montane.

## 2016-06-25 ENCOUNTER — Encounter: Payer: Self-pay | Admitting: Student in an Organized Health Care Education/Training Program

## 2016-06-25 DIAGNOSIS — Z23 Encounter for immunization: Secondary | ICD-10-CM

## 2016-06-25 DIAGNOSIS — IMO0002 Reserved for concepts with insufficient information to code with codable children: Secondary | ICD-10-CM | POA: Insufficient documentation

## 2016-06-25 LAB — COMPREHENSIVE METABOLIC PANEL
ALT: 11 U/L (ref 0–35)
AST: 19 U/L (ref 0–35)
Albumin: 2.8 g/dL — ABNORMAL LOW (ref 3.5–5.2)
Alk Phos: 106 U/L — ABNORMAL HIGH (ref 35–105)
Anion Gap: 11 (ref 7–16)
Bilirubin,Total: 0.2 mg/dL (ref 0.0–1.2)
CO2: 23 mmol/L (ref 20–28)
Calcium: 7.7 mg/dL — ABNORMAL LOW (ref 8.8–10.2)
Chloride: 101 mmol/L (ref 96–108)
Creatinine: 0.87 mg/dL (ref 0.51–0.95)
GFR,Black: 103 *
GFR,Caucasian: 89 *
Glucose: 92 mg/dL (ref 60–99)
Lab: 9 mg/dL (ref 6–20)
Potassium: 4.3 mmol/L (ref 3.3–5.1)
Sodium: 135 mmol/L (ref 133–145)
Total Protein: 5.1 g/dL — ABNORMAL LOW (ref 6.3–7.7)

## 2016-06-25 LAB — SYPHILIS SCREEN
Syphilis Screen: NEGATIVE
Syphilis Status: NONREACTIVE

## 2016-06-25 LAB — CBC
Hematocrit: 21 % — ABNORMAL LOW (ref 34–45)
Hemoglobin: 6.4 g/dL — ABNORMAL LOW (ref 11.2–15.7)
MCH: 27 pg/cell (ref 26–32)
MCHC: 30 g/dL — ABNORMAL LOW (ref 32–36)
MCV: 89 fL (ref 79–95)
Platelets: 228 10*3/uL (ref 160–370)
RBC: 2.4 MIL/uL — ABNORMAL LOW (ref 3.9–5.2)
RDW: 14.6 % — ABNORMAL HIGH (ref 11.7–14.4)
WBC: 13.9 10*3/uL — ABNORMAL HIGH (ref 4.0–10.0)

## 2016-06-25 MED ORDER — DIPHENHYDRAMINE HCL 25 MG PO TABS *I*
25.0000 mg | ORAL_TABLET | Freq: Every evening | ORAL | Status: DC | PRN
Start: 2016-06-25 — End: 2016-06-27

## 2016-06-25 MED ORDER — METOCLOPRAMIDE HCL 10 MG PO TABS *I*
10.0000 mg | ORAL_TABLET | Freq: Once | ORAL | Status: AC
Start: 2016-06-25 — End: 2016-06-25
  Administered 2016-06-25: 10 mg via ORAL
  Filled 2016-06-25: qty 1

## 2016-06-25 MED ORDER — OXYCODONE-ACETAMINOPHEN 5-325 MG PO TABS *I*
2.0000 | ORAL_TABLET | ORAL | Status: DC | PRN
Start: 2016-06-25 — End: 2016-06-26
  Administered 2016-06-25 – 2016-06-26 (×4): 2 via ORAL
  Filled 2016-06-25 (×4): qty 2

## 2016-06-25 MED ORDER — DIPHENHYDRAMINE HCL 25 MG PO TABS *I*
ORAL_TABLET | ORAL | Status: AC
Start: 2016-06-25 — End: 2016-06-25
  Administered 2016-06-25: 25 mg via ORAL
  Filled 2016-06-25: qty 1

## 2016-06-25 MED ORDER — LACTATED RINGERS IV SOLN *I*
50.0000 mL/h | INTRAVENOUS | Status: AC
Start: 2016-06-25 — End: 2016-06-26
  Administered 2016-06-25 (×7): 50 mL/h via INTRAVENOUS

## 2016-06-25 MED ORDER — OXYCODONE-ACETAMINOPHEN 5-325 MG PO TABS *I*
1.0000 | ORAL_TABLET | ORAL | Status: DC | PRN
Start: 2016-06-25 — End: 2016-06-26

## 2016-06-25 MED ORDER — METRONIDAZOLE 500 MG PO TABS *I*
500.0000 mg | ORAL_TABLET | Freq: Three times a day (TID) | ORAL | Status: AC
Start: 2016-06-25 — End: 2016-06-27
  Administered 2016-06-25 – 2016-06-27 (×6): 500 mg via ORAL
  Filled 2016-06-25 (×6): qty 1

## 2016-06-25 MED ORDER — MAGNESIUM SULFATE 20G IN 500ML LACTATED RINGERS *I*
2.0000 g/h | INTRAVENOUS | Status: DC
Start: 2016-06-25 — End: 2016-06-27
  Administered 2016-06-25 (×2): 2 g/h via INTRAVENOUS
  Filled 2016-06-25 (×7): qty 500

## 2016-06-25 MED ORDER — METRONIDAZOLE 500 MG PO TABS *I*
500.0000 mg | ORAL_TABLET | Freq: Three times a day (TID) | ORAL | Status: DC
Start: 2016-06-25 — End: 2016-06-25

## 2016-06-25 MED ORDER — CEPHALEXIN 500 MG PO CAPS *I*
500.0000 mg | ORAL_CAPSULE | Freq: Four times a day (QID) | ORAL | Status: DC
Start: 2016-06-25 — End: 2016-06-25

## 2016-06-25 MED ORDER — CEPHALEXIN 500 MG PO CAPS *I*
500.0000 mg | ORAL_CAPSULE | Freq: Four times a day (QID) | ORAL | Status: AC
Start: 2016-06-25 — End: 2016-06-27
  Administered 2016-06-25 – 2016-06-26 (×7): 500 mg via ORAL
  Filled 2016-06-25 (×8): qty 1

## 2016-06-25 NOTE — Assessment & Plan Note (Signed)
-   plan for UCDS on admission

## 2016-06-25 NOTE — Progress Notes (Addendum)
OBSTETRICS CESAREAN DELIVERY POST-OP PROGRESS NOTE   Postpartum Day: 1    Subjective     Jenna [name] is doing well, groggy this AM.  Pain is well-controlled with current regimen.  Tolerating crackers and juice without nausea/vomiting.  Flatus absent. She  has ambulated.  Denies chest pain, SOB, or lightheadedness.  Appropriate vaginal bleeding.  Baby is doing ok in the NICU.      Objective     Vitals:    06/25/16 0431 06/25/16 0432 06/25/16 0433 06/25/16 0531   BP:  118/57  120/57   BP Location:       Pulse: 88 81 89 86   Resp:  16  17   Temp:  36.6 C (97.9 F)     TempSrc:  Temporal     SpO2: 95%  94% 95%   Weight:       Height:           Urine Output: Urine output: 550 cc over last 24 hours (22cc/hr)    Mental Status: Alert and oriented x 3  Cardiovascular: Regular rate and rhythm with no murmurs  Respiratory: Clear to auscultate  Abdomen: Soft, appropriately tender, nondistended, +BS  Incision: dressing in place, clean, dry and intact  Fundus: Fundus firm at umbilicus  Neurological: Normal, average response (2+)  Extremities/Skin: Minimal edema of pedal and pretibial (1+)       Labs       Recent Labs  Lab 06/24/16  1837   Hematocrit 30*       ABO RH Blood Type (no units)   Date Value   06/24/2016 A RH POS                  Rubella IgG AB (no units)   Date Value   03/25/2016 POSITIVE           Assessment & Plan     Zaydah is a 31 y.o. Z6X0960 on POD# 1 s/p repeat C-Sec, T/J Extention  under general anesthesia at [redacted]w[redacted]d for severe gHTN.  Doing well.    Routine postoperative care:  - Pain management: toradol, dilaudid PCA   - DVT Prophylaxis: SCDs. Early ambulation. Heparin 7500U TID   - Infection ppx: flagyl and keflex x 48h   - UOP: inadequate. Will bolus PRN - slowly to reduce risk of infection    - FEN: Regular diet.    - PPHct pending.      Severe gestational HTN  - Mag x24h PP (KOW)  - spot 0.07 03/25/2016  - HELLP labs wnl 03/18/2016  - HELLP labs pending this AM     PPH   - EBL 2000 ccs     Postpartum care:  - Rh  status as above. Rhogam is not indicated.    - Infant: girl.    - Feeding: bottle  - PPBC: Nexplanon  - Immunizations: s/p flu; Tdap ordered     Disposition: Plan for D/C home POD # 3.    Cleophus Molt, MD, PhD  OB/GYN PGY-2  [phone]  06/25/2016   6:21 AM

## 2016-06-25 NOTE — Assessment & Plan Note (Signed)
-   has not presented to prenatal care since 26 weeks  - currently reports may not want BTL, though unsure.   - Reports would instead consider Nexplanon for PPBC if she decides against BTL  - Given her gestational age, sent to triage for admission and delivery

## 2016-06-25 NOTE — Progress Notes (Signed)
[facility] Prenatal Visit    SUBJECTIVE  Jenna [name] is a 31 y.o., [phone] female presenting for routine OBC at [redacted]w[redacted]d. She denies HA, vision changes, CP, or SOB. She endorses having LUQ pain and mid abdominal pain similar to contraction.      Contractions: Yes   Loss of fluid: No   Bleeding: No   FM:  Yes      Pregnancy complicated by:   Patient Active Problem List   Diagnosis Code    Hidradenitis L73.2    Tobacco abuse Z72.0    Obesity E66.9    Cocaine abuse F14.11    Hx of LTCS x 3 Z98.891    Asthma J45.909    Supervision of high risk pregnancy due to social problems O09.70    Depression F32.9    Family history of SIDS (sudden infant death syndrome) Z84.82    Cough R05    Gestational HTN O13.9    Fetal anomaly Q89.7     Problem list, vitals, family & social history, medications, and allergies were reviewed and updated as appropriate today.      OBJECTIVE  Vitals:    06/24/16 1544   BP: (!) 160/100   Pulse:    Weight:    Height:               Urine POCT:  Protein: Negative  Glucose: Normal     Exam:            General appearance - oriented to person, place, and time, overweight and alert and appears excited  Chest - clear to auscultation, no wheezes, rales or rhonchi, symmetric air entry  Heart - normal rate, regular rhythm, normal S1, S2, no murmurs, rubs, clicks or gallops  Abdomen - gravid, soft, minimally TTP  Neurological - normal reflexes      ASSESSMENT / PLAN  30 y.o., [id number] at [redacted]w[redacted]d with high-risk pregnancy.     Orders Placed This Encounter   Procedures    POCT urinalysis dipstick     Today's Plan:  Cocaine abuse  - plan for UCDS on admission    Gestational HTN  - had BP of 170/84 at 20 weeks (possibly secondary to cocaine use)  - BP in the office is also persistent severe range, asymptomatic, physical exam wnl.   - sent to triage for pre-eclampsia work up   - given possibility of cocaine use, would avoid IV labetalol if possible until UCDS results    Supervision of high risk  pregnancy due to social problems  - has not presented to prenatal care since 26 weeks  - currently reports may not want BTL, though unsure.   - Reports would instead consider Nexplanon for PPBC if she decides against BTL  - Given her gestational age, sent to triage for admission and delivery    Fetal anomaly  - fluid filled bowel segment was noted on 2/1 on fetal US with a recommendation for 2 to 3 weeks follow up.   - follow up US was not obtained due to patient's lack of follow up, plan to evaluate post-delivery. Will need to inform pediatrics.     Follow-up:   Return to the office in 1 week after delivery for incisional check and BP check. Sent to triage by ambulance.     D/w Dr. Athena Masse    Janeal Holmes, MD  OBGYN, [place]  Pager # [phone]  06/24/2016

## 2016-06-25 NOTE — Plan of Care (Signed)
C-Section Postpartum Care     Vital signs are medically acceptable Maintaining     Respiratory - Lungs clear to auscultation Maintaining     Cardiovascular - Homan's sign negative Maintaining     Dressing intact until removed with any drainage marked Maintaining     Fundus firm at midline Maintaining     Moderate rubra freeflow, no purulent discharge, no foul smelling lochia Maintaining     Urine output is 30 ml/hour or more Maintaining     Breasts are soft with nipple integrity intact Maintaining        Knowledge Deficit     Patient/S.O. demonstrates understanding of disease process, treatment plan, medications, and discharge instructions. Maintaining        Pain     Patient's pain/discomfort is manageable Maintaining     Report decrease in pain level Maintaining        Safety - OB     Follow unit's safety guidelines Maintaining          C-Section Postpartum Care     Patient is able to void/empty bladder after catheter is removed Progressing towards goal

## 2016-06-25 NOTE — Progress Notes (Signed)
Report Given To  Trudee Kuster, RN      Descriptive Sentence / Reason for Admission   Jenna [name] is a 31 y.o. Z6X0960 at [redacted]w[redacted]d from [facility] admitted for rLTCS, gestational HTN superimposed on Pre-E.  Hx: asthma, tobacco use, PICA, hidradenitis, BMI >50, PSA, depression, Bipolar/ SI(09/2015), seen in CPEP in 12/2015, pt's mom has ALS (living with pt), SIDS of pt's 65 month old.    Delivery: rLTCS, cyst taken from R fallopian tube, sent to path  Date:06/24/16  Time: 2002  Blood type: A+    Reviewed by Lawrence Marseilles, RN        Active Issues / Relevant Events   Incision: Dressing, clean dry intact, edges approximated on initial dressing placement  Allergies: N/A  Dr. Burnadette Peter & Lum Keas  EBL:  Hct: 30->   Prophyl Keflex Q6h for pannus and incision  RPR: negative 03/25/16  PCA: 1000  Fundus: -2, midline, firm, scant/ mod rubra freeflow, next check 0730    Reviewed by Lawrence Marseilles, RN        To Do List  IV date  of change 06/28/2016  Flu: done  TdaP:  06/25/16  I+O: low UO, 0645 550cc!!!  Labs: sent  Routine pp care  Medicated @ 0500 Toradol, has dilaudid PCA  PPBC: Nexplanon    Reviewed by Lawrence Marseilles, RN          Anticipatory Guidance / Discharge Planning  Breast Pump :no  DOHM Completed : yes  Social Work : SW and [facility]  Anticipated Discharge Date : per MD    Reviewed by Lawrence Marseilles, RN

## 2016-06-25 NOTE — Addendum Note (Signed)
Addendum  created 06/25/16 1457 by Malena Catholic, MD,PHD    Sign clinical note

## 2016-06-25 NOTE — Lactation Note (Signed)
This note was copied from a baby's chart.  Lactation Consultant Daily Visit   Patient: Jenna [name] " "        Age: 31 days              Corrected GA: 39w 6d  MRN: [id number]      Maternal Information   Mothers Name:   Jenna [name]    Visit Type: routine/daily         Newborn Assessment   Infant Weight:      Birth Weight: 3840 g (8 lb 7.5 oz)     Today's Weight: 3840 g (8 lb 7.5 oz)     Percent Weight Change: 0 %        Interventions and Instructions   Introduced myself and role as part of the NICU lactation support team.  Provided mother with information about meals for breastfeeding mothers, ability to pump at NICU bedside, and recommendation to spend as much time in NICU with baby as she medically/safelty can.  Mom stated she is part of a study and has a home pump.  The women who runs the study will come in a support mom.  Family was not interested in talking about lactation at this point.  Per mom her plan is to provide breast milk and formula.  She has not pumped at this time.    Reinforced the need to pump 8 times in a 24 hour period on strongest suction without pain for 15 minutes.     Plan   Follow-up with lactation daily while mother/infant dyad inpatient or by maternal request.  Length of this call/visit: 0-5 minutes     Venda Rodes, RN  Lactation Consultant

## 2016-06-25 NOTE — Progress Notes (Signed)
Report Given To  Verner Chol, RN      Descriptive Sentence / Reason for Admission   Atalaya Zappia is a 31 y.o. Z6X0960 at [redacted]w[redacted]d from [facility] admitted for rLTCS, gestational HTN superimposed on Pre-E.  Hx: asthma, tobacco use, PICA, hidradenitis, BMI >50, PSA, depression, Bipolar/ SI(09/2015), seen in CPEP in 12/2015, pt's mom has ALS (living with pt), SIDS of pt's 49 month old.    Delivery: rLTCS, cyst taken from R fallopian tube, sent to path  Date:06/24/16  Time: 2002  Blood type: A+            Active Issues / Relevant Events   Incision: Dressing, clean dry intact, edges approximated on initial dressing placement  Allergies: N/A  Dr. Burnadette Peter & Lum Keas  EBL:  Hct: 30->   Prophyl Keflex Q6h for pannus and incision  RPR: negative 03/25/16  PCA: 1000  Mag stopped 4/13 at 1930            To Do List  IV date  of change 06/28/2016  Flu: done  TdaP:  06/25/16  I+O:  Labs: sent  Routine pp care  Medicated has dilaudid PCA  PPBC: Nexplanon        Anticipatory Guidance / Discharge Planning  Breast Pump :no  DOHM Completed : yes  Social Work : SW and WIC--CPS REFERRAL  Anticipated Discharge Date : per MD

## 2016-06-25 NOTE — Plan of Care (Signed)
Problem: Safety - OB  Goal: Follow unit's safety guidelines  Outcome: Progressing towards goal

## 2016-06-25 NOTE — Assessment & Plan Note (Addendum)
-   had BP of 170/84 at 20 weeks (possibly secondary to cocaine use)  - BP in the office is also persistent severe range, asymptomatic, physical exam wnl.   - sent to triage for pre-eclampsia work up   - given possibility of cocaine use, would avoid IV labetalol if possible until UCDS results

## 2016-06-25 NOTE — Lactation Note (Addendum)
Lactation Consultant Daily Visit   Patient: Jenna [name] " "                   MRN: [id number]      Approached mom regarding feeding preferences. Initially stated she was going to try a little bit of both breast & formula, but when writer started talking about the pumping process to get milk established she then changed her mind & states that she desires to bottle/formula feed infant. Offered opportunity to ask any questions or address concerns regarding her decision. When writer enquired if she breast fed her 2 other children, she said for a 1/2 a day with each & said her goal for this one was 1 day or so. At this time, mother has chosen to formula feed her infant. reviewed regarding engorgement management, including with tips should she experience normal physiologic engorgement without breastfeeding/pumping. Advised to call if she has any concerns.  Available as resource if needed.      Katrina Stack, RN  Lactation Consultant

## 2016-06-25 NOTE — Assessment & Plan Note (Signed)
-   fluid filled bowel segment was noted on 2/1 on fetal US with a recommendation for 2 to 3 weeks follow up.   - follow up US was not obtained due to patient's lack of follow up, plan to evaluate post-delivery. Will need to inform pediatrics.

## 2016-06-25 NOTE — Progress Notes (Signed)
Pt. Admitted to [id number] from [id number] via W/C at 2113. Oriented to room. Baby to be transferred from [facility] to [id number] shortly.

## 2016-06-25 NOTE — Progress Notes (Addendum)
Social Work Note  Jenna [name] delivered a baby girl on 06/24/16, infant is currently in the NICU.  Jenna [name] is known to SW from her sporadic prenatal care in [facility], please see erecord for initial SW psychosocial (03/25/16)  and f/u notes.  Jenna [name] has several risk factors that are concerning and SW met with pt today to assess for d/c planning needs.  Jenna [name] has a long hx of substance use, admits to relapsing on cocaine a few weeks ago, last + UCDS was 3/21/8.  Pt acknowledged stressors in her life that contributed to her using including her mother's ALS diagnosis and poor prognosis.  Jenna [name] reports that she recently began attending outpt drug tx 3x per week at [facility] and has a therapist she will be seeing for individual sessions.   Jenna [name] also has a long Barry hx, was last psychiatrically admitted from 11/24/15- 12/16/15.due to depression and suicidal thoughts.  Jenna [name] was supposed to f/u with outpt tx and medication management but she did not follow through with recommendations.  Pt indicated that Huether Vertell Limber is referring her to [facility] for [place] needs.  Jenna [name] denies any current mood issues, denies having experiencing post partum depression with any of her children.   Jenna [name] oldest 2 sons (ages 67 and 46) are cared for by their aunt [name].  Pt indicated that she sees her sons regulary and Pamala Hurry is a support person in her life.  Jenna [name] 31yo is with her maternal aunt and she does not have contact with that child.  Pt's 3yo is in the custody of his father.  CPS has been involved in the past, it is unclear how many of these placements were mandated by CPS.  I believe her last child was removed by CPS.  Jenna [name] denies any current involvement.    Jenna [name] stated that she is almost ready for her baby except for a bassinet.  She inquired if SW could provide one prior to delivery and was informed that one is not available.  Pt reported that she has the money to purchase one.   Jenna [name] is not enrolled in [facility], [place] provided her with the phone number.  Pt plans to bring her baby to [facility] for pediatric care, she is familiar with [facility] [address] SW  Ardyth Gal.   [name] is Sonny Dandy, age 40.  This is their first child together.  Pt and [name] reside at [place], [place]. Pt denies any IPV issues with [name].    Impression:  It appears that pt's MH and SA issues have impeded her ability to care for her children in the past.  Given that pt has recently used cocaine, is not connected to [place], and does not have custody of her children, further investigation is needed to assess for saftey issues.    Plan:  CPS report filed with [facility] hotline worker Hurley Cisco , [place] # [id number].  Awaiting call from assigned CPS worker to discuss d/c planning.   Jerline Pain, [place] [phone]      Addendum:  Assigned CPS worker is Jorene Guest [phone].  Jenna [name] will be meeting with Jenna [name] this afternoon.   Jerline Pain, [place] [phone]

## 2016-06-26 MED ORDER — LIDOCAINE HCL 1 % IJ SOLN *I*
20.0000 mL | Freq: Once | INTRAMUSCULAR | Status: AC
Start: 2016-06-26 — End: 2016-06-27

## 2016-06-26 MED ORDER — KETOROLAC TROMETHAMINE 30 MG/ML IJ SOLN *I*
30.0000 mg | Freq: Once | INTRAMUSCULAR | Status: AC
Start: 2016-06-26 — End: 2016-06-26

## 2016-06-26 MED ORDER — KETOROLAC TROMETHAMINE 30 MG/ML IJ SOLN *I*
30.0000 mg | Freq: Once | INTRAMUSCULAR | Status: DC
Start: 2016-06-26 — End: 2016-06-26

## 2016-06-26 MED ORDER — KETOROLAC TROMETHAMINE 10 MG PO TABS *I*
10.0000 mg | ORAL_TABLET | Freq: Four times a day (QID) | ORAL | Status: DC | PRN
Start: 2016-06-26 — End: 2016-06-26
  Filled 2016-06-26: qty 1

## 2016-06-26 MED ORDER — IBUPROFEN 600 MG PO TABS *I*
600.0000 mg | ORAL_TABLET | ORAL | Status: DC | PRN
Start: 2016-06-26 — End: 2016-06-27
  Administered 2016-06-27 (×2): 600 mg via ORAL
  Filled 2016-06-26 (×2): qty 1

## 2016-06-26 MED ORDER — OXYCODONE-ACETAMINOPHEN 5-325 MG PO TABS *I*
1.0000 | ORAL_TABLET | ORAL | 0 refills | Status: DC | PRN
Start: 2016-06-26 — End: 2016-06-27

## 2016-06-26 MED ORDER — SIMETHICONE 80 MG PO CHEW *I*
80.0000 mg | CHEWABLE_TABLET | Freq: Four times a day (QID) | ORAL | Status: DC | PRN
Start: 2016-06-26 — End: 2016-06-27

## 2016-06-26 MED ORDER — KETOROLAC TROMETHAMINE 30 MG/ML IJ SOLN *I*
INTRAMUSCULAR | Status: AC
Start: 2016-06-26 — End: 2016-06-26
  Administered 2016-06-26: 30 mg via INTRAVENOUS
  Filled 2016-06-26: qty 1

## 2016-06-26 MED ORDER — BREAST PUMP MISC *A*: 0 refills | Status: DC

## 2016-06-26 MED ORDER — HYDROCODONE-ACETAMINOPHEN 5-325 MG PO TABS *I*
1.0000 | ORAL_TABLET | Freq: Four times a day (QID) | ORAL | Status: DC | PRN
Start: 2016-06-26 — End: 2016-06-27

## 2016-06-26 MED ORDER — IBUPROFEN 600 MG PO TABS *I*
600.0000 mg | ORAL_TABLET | Freq: Four times a day (QID) | ORAL | Status: DC | PRN
Start: 2016-06-26 — End: 2016-06-26
  Filled 2016-06-26: qty 1

## 2016-06-26 MED ORDER — TETANUS-DIPHTH-ACELL PERT 5-2.5-18.5 LF-MCG/0.5 IM SUSP *WRAPPED*
0.5000 mL | INTRAMUSCULAR | Status: DC
Start: 2016-06-26 — End: 2016-06-26

## 2016-06-26 MED ORDER — IBUPROFEN 200 MG PO TABS *I*
600.0000 mg | ORAL_TABLET | Freq: Four times a day (QID) | ORAL | 1 refills | Status: AC | PRN
Start: 2016-06-26 — End: 2016-07-06

## 2016-06-26 MED ORDER — DOCUSATE SODIUM 100 MG PO CAPS *I*
100.0000 mg | ORAL_CAPSULE | Freq: Two times a day (BID) | ORAL | Status: DC
Start: 2016-06-26 — End: 2017-06-07

## 2016-06-26 MED ORDER — HYDROCODONE-ACETAMINOPHEN 5-325 MG PO TABS *I*
2.0000 | ORAL_TABLET | Freq: Four times a day (QID) | ORAL | Status: DC | PRN
Start: 2016-06-26 — End: 2016-06-27
  Administered 2016-06-26 – 2016-06-27 (×4): 2 via ORAL
  Filled 2016-06-26 (×4): qty 2

## 2016-06-26 MED ORDER — IBUPROFEN 600 MG PO TABS *I*
600.0000 mg | ORAL_TABLET | Freq: Four times a day (QID) | ORAL | Status: DC | PRN
Start: 2016-06-26 — End: 2016-06-26

## 2016-06-26 NOTE — Progress Notes (Signed)
OBSTETRICS DISCHARGE ROUNDING NOTE   Postpartum Day: 2    Subjective     Jenna [name] is feeling a lttle better after IV toradol. Wants to go out for a smoke. Understands she will have crampy pain even when she goes home. Pain currently controlled. Flatus present. Tolerating regular diet  without nausea/vomiting.  Flatus absent. She  has ambulated.  Denies chest pain, SOB, or lightheadedness.  Appropriate vaginal bleeding.  Baby is doing ok       Objective     Vitals:    06/26/16 0020 06/26/16 0830 06/26/16 1230 06/26/16 1633   BP: 116/65 135/79 126/58 133/63   BP Location:  Right arm Right arm Right arm   Pulse: 101 85 90 86   Resp: Temp: 36.9 C (98.4 F) 36.4 C (97.5 F) 36.7 C (98.1 F) 36.4 C (97.5 F)   TempSrc: Temporal Temporal Temporal Temporal   SpO2:       Weight:       Height:           Urine Output: adequate    Mental Status: Alert and oriented x 3  Cardiovascular: Regular rate and rhythm with no murmurs  Respiratory: Clear to auscultate  Abdomen: Soft, appropriately tender, nondistended, +BS  Incision: dressing in place, c/d/i, removed- incision clean and dry    Fundus: Fundus firm at umbilicus  Neurological: Normal, average response (2+)  Extremities/Skin: Minimal edema of pedal and pretibial (1+)       Labs       Recent Labs  Lab 06/25/16  0639 06/24/16  1837   Hematocrit 21* 30*       ABO RH Blood Type (no units)   Date Value   06/24/2016 A RH POS                    Rubella IgG AB (no units)   Date Value   03/25/2016 POSITIVE           Assessment & Plan     Jenna [name] is a 31 y.o. Z6X0960 on POD# 2 s/p repeat C-Sec, T/J Extention  under general anesthesia at [redacted]w[redacted]d for severe gHTN.  Doing well.    Routine postoperative care:  - Pain management: toradol, dilaudid PCA   - DVT Prophylaxis: SCDs. Early ambulation. Heparin 7500 U TID   - Infection ppx: flagyl and keflex x 48h   - UOP: adequate    - FEN: Regular diet.    - PPHct 21, ferrous sulfate indicated    Severe gestational HTN  - s/p mag  -  spot 0.07 03/25/2016  - HELLP labs wnl 03/18/2016  - HELLP labs wnl 4/13    PPH   - EBL 2000 ccs     Postpartum care:  - Rh status as above. Rhogam is not indicated.    - Infant: girl.    - Feeding: bottle  - PPBC: TBD at [facility] visit. Did not want to discuss this afternoon.  - Immunizations: s/p flu; s/p TdaAP    Disposition: Plan for D/C home tomorrow AM if pain controlled.     Mosetta Anis, MD  Obstetrics & Gynecology, PGY-1  Pager [phone]

## 2016-06-26 NOTE — Progress Notes (Signed)
OBSTETRICS CESAREAN DELIVERY POST-OP PROGRESS NOTE   Postpartum Day: 2    Subjective     Jenna [name] is sleeping this AM, awoke, but wanted to sleep more. No complaints.  Pain is well-controlled with current regimen.  Tolerating regular diet  without nausea/vomiting.  Flatus absent. She  has ambulated.  Denies chest pain, SOB, or lightheadedness.  Appropriate vaginal bleeding.  Baby is doing ok       Objective     Vitals:    06/25/16 1912 06/25/16 2013 06/25/16 2125 06/26/16 0020   BP: 130/68 123/75 128/61 116/65   BP Location:       Pulse: 88 90 98 101   Resp: Temp: 36.5 C (97.7 F) 36.4 C (97.5 F) 36.5 C (97.7 F) 36.9 C (98.4 F)   TempSrc:  Temporal Temporal Temporal   SpO2: 98% 98%     Weight:       Height:           Urine Output: adequate    Mental Status: Alert and oriented x 3  Cardiovascular: Regular rate and rhythm with no murmurs  Respiratory: Clear to auscultate  Abdomen: Soft, appropriately tender, nondistended, +BS  Incision: dressing in place, clean, dry and intact- did not want writer to take off this AM.   Fundus: Fundus firm at umbilicus  Neurological: Normal, average response (2+)  Extremities/Skin: Minimal edema of pedal and pretibial (1+)       Labs       Recent Labs  Lab 06/25/16  0639 06/24/16  1837   Hematocrit 21* 30*       ABO RH Blood Type (no units)   Date Value   06/24/2016 A RH POS                    Rubella IgG AB (no units)   Date Value   03/25/2016 POSITIVE           Assessment & Plan     Jenna [name] is a 31 y.o. Z6X0960 on POD# 2 s/p repeat C-Sec, T/J Extention  under general anesthesia at [redacted]w[redacted]d for severe gHTN.  Doing well.    Routine postoperative care:  - Pain management: toradol, dilaudid PCA   - DVT Prophylaxis: SCDs. Early ambulation. Heparin 7500 U TID   - Infection ppx: flagyl and keflex x 48h   - UOP: adequate    - FEN: Regular diet.    - PPHct 21, ferrous sulfate indicated    Severe gestational HTN  - s/p mag  - spot 0.07 03/25/2016  - HELLP labs wnl 03/18/2016  -  HELLP labs wnl 4/13    PPH   - EBL 2000 ccs     Postpartum care:  - Rh status as above. Rhogam is not indicated.    - Infant: girl.    - Feeding: bottle  - PPBC: Nexplanon, needs consent. Wanted Clinical research associate to come back later to consent when more awake.   - Immunizations: s/p flu; s/p TdaAP    Disposition: Plan for D/C home POD # 3.    Mosetta Anis, MD  Obstetrics & Gynecology, PGY-1  Pager [phone]

## 2016-06-26 NOTE — Discharge Instructions (Signed)
POSTPARTUM CESAREAN DELIVERY INSTRUCTIONS    General Activity:  On the day of discharge, go home and rest. Increase activity such as walking and stair climbing gradually. No strenuous activity or heavy lifting should be done for several weeks. Do not drive for 1-2 weeks after your delivery. It is easy to have an accident when you are tired or not moving as quickly due to pain.  You also should not drive for as long as you are taking narcotic pain medication.  Travel is fine after 2 weeks, but if on a long trip, get out and walk every 2 hours to maintain circulation. Often your feet and ankles will become swollen during the week after delivery. If you had IV fluids, you may swell for several weeks.      We recommend pelvic rest (nothing in the vagina) for at least 2 weeks, until your bleeding stops, or as otherwise instructed by your physician. This means no sexual intercourse, tampons, douching or deep baths.  Sitz bathes are permitted (a few inches of warm water only in the tub).     Emotional Changes: It is normal to experience some mild sadness or emotional swings in the first week or two after delivery. If your symptoms do not get better after 2 weeks or you are experiencing worsening sadness, mood swings, thoughts of hurting yourself or others, including your baby, please call us right away. Postpartum depression is real and treatable, but it is easier to treat the earlier it is detected.    Employment:  You may return to work 6-8 weeks after delivery. This is standard [place].      Incisional Care:  Keep your incision clean and dry.  You should let the warm soapy water fall over your incision in the shower.  You should not scrub the area.  If your incision is under your abdomen, gently lift your abdomen to allow the water into the area.  Pat dry with a towel when you get out of the shower or dry with a hairdryer on COOL only.  If you have Steri-strips (tape bandages) over your incision, they should be  removed after one week if they haven't fallen off on their own.  If they become dirty or wet, they can be removed earlier.  Neosporin and other products/creams should not be applied to the incision until directed by your physician.    If you notice redness, swelling, increased pain, or drainage from the incision, call your provider immediately.  It can sometimes be difficult to see your incision postpartum.  You may need a friend/family member or a mirror to see your incision.  Look at your incision daily at least for the first 2 weeks following your delivery.      Hemorrhoids:  Keep the perineal area clean with baths, showers or Sitz baths. Anesthetic numbing spray or similar ointment is available over the counter and can be applied as needed to stitches. If you have lacerations, they will heal within the next few weeks and the stitches will dissolve on their own. Preparation H, Anusol and Tucks are fine for hemorrhoids.     Breast Care:    If you are breastfeeding, remember that it is a new learning experience for you and your baby. Breastfeeding can come very easily for some new moms and for others it’s ok if it needs patience and perseverance. Our lactation consultants have already given you lots of information, and here are a few specific tips to help   you take care of your breasts:   - Wear a comfortable, supportive bra without underwires   - Take good care of yourself so you have lots of time and energy to bond with and feed   your baby. Try to rest as much as possible in between feedings.    - Make sure you have a comfortable, deep latch every time you feed your baby   - If your breasts become engorged, you can gently massage them, take a warm shower   or bath, use warm or cold packs for comfort, or even take some ibuprofen.    - For sore or dry nipples, try to use a little breast milk around your nipple and areola   (pigmented area around breasts) after feeding. Silicone nipple soothing pads or   cool packs  can also help. If your nipples are very sore, contact your healthcare   provider as we may need to help you find a more comfortable latch.    - If you feel stringy full areas in your breasts, you may have some clogged ducts (feel like   stringy clumps). These can be helped by massage and a warm shower or wash   cloth on the breast before you nurse.    Breast infections can occur whether you are nursing or not. Symptoms include fever, chills, and painful areas on your breast. Usually antibiotics are needed, so call us during the day with your pharmacy number. Gentle massage can help as well. If you are nursing, keep nursing while you contact your provider. If you need to use pain medications other than what we list in these instructions, check with your OB provider or pediatrician's office to determine if they are safe for breastfeeding.    If you are feeding formula only, a snug bra can help to prevent pain and engorgement. If you do get pain or swelling, you can apply ice packs to your breasts and take Ibuprofen. You may need to do this for several days. We do not use medication of any kind to "dry up" your breast milk.    Abdominal Cramps:  "After birth pains" can be severe, especially with nursing and after the birth of the second or more child. They rarely last more than 4 days.    Pain Medication: Ibuprofen or acetaminophen are both safe to take while nursing and are usually effective at managing cramps.  Narcotic pain medication (Oxycodone, Percocet, Dilaudid) may be needed for pain not relieved with over-the-counter medications.  If you are taking a narcotic with Acetaminophen in it, do not take any additional Tylenol.  Narcotic pain medication can cause lightheadedness/dizziness, nausea/vomiting, confusion, sleepiness, and constipation.  Avoid taking these medications when home alone with the infant until you know how you will respond to them.  Do not drive when taking these medications.  You should take  stool softners (Colace, Miralax) while taking these medications.    Vitamins and Iron:  Continue the vitamins throughout nursing or for 1 month if not nursing. Continue iron for 1 month.    Bathing:  Showers and shallow baths are fine. No douching. Deep baths are also not recommended.    Constipation:  Stool softeners such as Colace, Metamucil, Senokot, Miralax, or generic are fine to use for mild constipation. Use milk of magnesia as necessary for persistent constipation.       Menstruation:  Bleeding is expected for several weeks. It may be red, blackish with clots. It changes to pink and   then yellow-gray.  Frequently, the discharge becomes red again. If the bleeding gets heavy again, you need to rest more.     The return of a real menstrual period varies. For non-nursing mothers, it is usually between 4-10 weeks. For nursing mothers, it can take over a year.  Several months of periods are usually required before cycles become regular. Bleeding may increase after you go home, due to increased activities.     Sexual Intercourse:  It takes varying times for pain from vaginal tears/episiotomies, hemorrhoids and swelling to go away so that you are comfortable. We recommend not resuming intercourse for at least 2 weeks, until your bleeding stops, or as otherwise instructed by your physician.   Nursing causes vaginal dryness and lubrication is often required. Remember, the possibility of becoming pregnant exists, even while nursing. You ovulate 2 weeks before the return of the first period, so you may become pregnant before your first period.      Contraception:  We recommend at least 18 months between pregnancies for you and your baby's health. Even if you are breastfeeding, you can become pregnant if you are not using a reliable and consistent form of contraception. We do not recommend using estrogen-containing products for the first several weeks after delivery due to the elevated risk of blood clots or while  breastfeeding. Progesterone-only products, however, are perfectly safe immediately postpartum and do not interfere with breastfeeding.     Postpartum Visit:  Call our office to schedule an appointment. This visit will be scheduled 6 weeks after you delivered.  Your physician may ask you to schedule an incision check in the office one week following delivery.  This will be specified on your discharge paperwork.      Call if the following occur:   1. Fever of 100.4 degrees F or severe chills. Check your temperature before calling, if possible. You may feel hot but you cannot determine if you have a true fever without a thermometer.    2. Excessively heavy vaginal bleeding.   3. Redness, swelling, or drainage from your incision.   4. Extreme urinary frequency and burning with urination.   5. Swelling or tenderness in one area of the breast.   6. Marked depression or anxiety.    7. Severe pain not improved with pain medication.   8. Chest pain or shortness or breath.   9. Significant swelling in one leg more than the other.   10. Persistent headache or visual changes.   11. Any other questions or concerns.      Please refer to your [facility] packet for any other questions not addressed here.

## 2016-06-27 MED ORDER — ETONOGESTREL 68 MG SC IMPL *I*
68.0000 mg | DRUG_IMPLANT | SUBCUTANEOUS | Status: DC
Start: 2016-06-27 — End: 2016-06-27

## 2016-06-27 MED ORDER — OXYCODONE-ACETAMINOPHEN 5-325 MG PO TABS *I*
1.0000 | ORAL_TABLET | ORAL | 0 refills | Status: AC | PRN
Start: 2016-06-27 — End: 2016-07-02

## 2016-06-27 NOTE — Progress Notes (Signed)
IV 20 gauge that had been placed yesterday afternoon approx 2:30 pm in pt's left wrist by M. Mearns RN was removed at 11:00 am today and tip of catheter was noted to be intact.

## 2016-06-27 NOTE — Progress Notes (Signed)
Social work contacted [facility] CPS [phone]) and spoke with Doug Sou. CPS has not had a chance to go out to visit the home in [facility]. Mom will be discharged today but newborn will have to remain in the hospital  even if deemed medically ready until cleared by CPS.    CPS is aware that newborn could be ready for discharge today or tomorrow so they will follow up with the family and do a home visit tomorrow.    Mom is aware that this is the plan and that newborn will not be discharged today. She is tearful but accepting of this.     Plan: Newborn not to be discharged until safe plan is determined by CPS and a home visit is completed.    Social work to continue to follow closely. Please page if other questions or concerns arise. Thanks.     Adline Mango, [name]  [phone] (weekend pager)

## 2016-06-27 NOTE — Discharge Summary (Signed)
Name: Cressida Milford MRN: [id number] DOB: October 15, 1985     Admit Date: 06/24/2016   Date of Discharge: 06/27/2016    Patient was accepted for discharge to   Home or Self Care [1]           Discharge Attending Physician: Arthur Holms [facility]      Hospitalization Summary    CONCISE NARRATIVE:   31 year old [id number] admitted at [redacted]w[redacted]d with severe gestational hypertension, she was started on magnesium for seizure prophylaxis. She is now s/p 4th repeat c-section complicated by dense fascial and intraabdominal adhesions necessitating bilateral Maylard incisions and T-shaped extension of the hysterotomy with difficult vacuum assisted extraction for the fetus. Patient with delivery to live female infant. See operative note for full details. She received magnesium for 24 hours postpartum for seizure prophylaxis. She had an uncomplicated postoperative course and was discharged home in stable condition with plan for office follow-up.            OTHER PROCEDURES/FINDINGS: LTCS      Signed: Jacqualine Code, MD  On: 06/27/2016  at: 1:37 PM

## 2016-06-28 ENCOUNTER — Telehealth: Payer: Self-pay | Admitting: Internal Medicine

## 2016-06-28 NOTE — Telephone Encounter (Signed)
Pt will follow with OB-Gynecology

## 2016-06-29 ENCOUNTER — Telehealth: Payer: Self-pay

## 2016-06-29 LAB — SURGICAL PATHOLOGY

## 2016-06-29 NOTE — Telephone Encounter (Signed)
Writer attempted all phone numbers in chart. Left message and sending letter home will postpartum appt time for next week.

## 2016-06-30 ENCOUNTER — Telehealth: Payer: Self-pay | Admitting: Internal Medicine

## 2016-06-30 NOTE — Telephone Encounter (Signed)
TRANSPORTATION   ARRANGEMENTS:              Appointment:             4/19                @ 1:20pm                Pick-up time @ 12:20pm              Pick-up location  [address]  [place] [address]                   [employer]ab [employer] is              [employer]               Cab Phone # [phone]              Transportation is round trip              Transportation arranged    06/30/2016                       @  4:28 PM    +1 rider going to   Patient is aware of transportation arrangements

## 2016-06-30 NOTE — Telephone Encounter (Signed)
Patient called and scheduled a follow up w/CCP per her OBGYN for tomorrow (07/01/16) at 1:20 pm.       Patient is asking for a Medicaid cab for transportation + 1 to be picked up and dropped off at     [address]   [place] [address]    Patient is asking to please be called back with confirmation @ [phone]

## 2016-07-01 ENCOUNTER — Ambulatory Visit: Payer: Medicaid (Managed Care) | Admitting: Student in an Organized Health Care Education/Training Program

## 2016-07-02 LAB — FENTANYL, URINE: Fentanyl,UR: NEGATIVE

## 2016-07-06 ENCOUNTER — Ambulatory Visit: Payer: Medicaid (Managed Care) | Admitting: Obstetrics and Gynecology

## 2016-07-07 ENCOUNTER — Telehealth: Payer: Self-pay

## 2016-07-07 NOTE — Telephone Encounter (Signed)
Pt missed PPM, writer attempted phone listed was unavailable to leave message.  Sent letter home with new appointment time

## 2016-07-08 ENCOUNTER — Ambulatory Visit
Payer: Medicaid (Managed Care) | Attending: Student in an Organized Health Care Education/Training Program | Admitting: Student in an Organized Health Care Education/Training Program

## 2016-07-08 ENCOUNTER — Encounter: Payer: Self-pay | Admitting: Student in an Organized Health Care Education/Training Program

## 2016-07-08 VITALS — BP 140/90 | HR 80 | Temp 97.9°F | Ht 65.0 in | Wt 290.8 lb

## 2016-07-08 DIAGNOSIS — Z720 Tobacco use: Secondary | ICD-10-CM

## 2016-07-08 DIAGNOSIS — L732 Hidradenitis suppurativa: Secondary | ICD-10-CM

## 2016-07-08 DIAGNOSIS — K219 Gastro-esophageal reflux disease without esophagitis: Secondary | ICD-10-CM

## 2016-07-08 DIAGNOSIS — J45909 Unspecified asthma, uncomplicated: Secondary | ICD-10-CM

## 2016-07-08 DIAGNOSIS — Z0000 Encounter for general adult medical examination without abnormal findings: Secondary | ICD-10-CM

## 2016-07-08 DIAGNOSIS — G43709 Chronic migraine without aura, not intractable, without status migrainosus: Secondary | ICD-10-CM

## 2016-07-08 DIAGNOSIS — Z716 Tobacco abuse counseling: Secondary | ICD-10-CM

## 2016-07-08 DIAGNOSIS — I10 Essential (primary) hypertension: Secondary | ICD-10-CM

## 2016-07-08 MED ORDER — FLUTICASONE-SALMETEROL 100-50 MCG/ACT IN AEPB *I*
1.0000 | INHALATION_SPRAY | Freq: Two times a day (BID) | RESPIRATORY_TRACT | 3 refills | Status: DC
Start: 2016-07-08 — End: 2017-03-02

## 2016-07-08 MED ORDER — CHLORHEXIDINE GLUCONATE 4 % EX LIQD *WRAPPED*
Freq: Every day | CUTANEOUS | 1 refills | Status: DC
Start: 2016-07-08 — End: 2017-04-22

## 2016-07-08 MED ORDER — METOPROLOL SUCCINATE 25 MG PO TB24 *I*
25.0000 mg | ORAL_TABLET | Freq: Every day | ORAL | 5 refills | Status: DC
Start: 2016-07-08 — End: 2017-01-24

## 2016-07-08 MED ORDER — PANTOPRAZOLE SODIUM 20 MG PO TBEC *I*
20.0000 mg | DELAYED_RELEASE_TABLET | Freq: Every day | ORAL | 5 refills | Status: DC
Start: 2016-07-08 — End: 2017-04-22

## 2016-07-08 MED ORDER — BLOOD PRESSURE MONITOR DEVICE *A*
0 refills | Status: DC
Start: 2016-07-08 — End: 2022-05-19

## 2016-07-08 MED ORDER — VARENICLINE TARTRATE 0.5 MG X 11 & 1 MG X 42 PO MISC *A*
ORAL | 0 refills | Status: DC
Start: 2016-07-08 — End: 2017-04-22

## 2016-07-08 NOTE — Patient Instructions (Addendum)
1. Please pick up your medications at the [facility] near the lobby.    2. Start taking advair 2x daily for your asthma as a controller medication. Continue albuterol as needed.    3. Start taking pantoprazole daily for acid reflux.    4. Start taking toprol-XL daily for blood pressure and migraines.    5. Start checking blood pressure 2-3x weekly.    6. Start taking chantix. Plan to quit after 2 weeks.    7. Stop by the lab to check blood work.          Acid Reflux (Gastroesophageal Reflux Disease) in Adults    The Basics   Written by the doctors and editors at UpToDate   What is acid reflux?--Acid reflux is when the acid that is normally in your stomach backs up into the esophagus, tube that carries food from your mouth to your stomach (figure 1). Another term for acid reflux is gastroesophageal reflux disease, or GERD.  What are the symptoms of acid reflux?--The symptoms include:   ?Burning in the chest, known as heartburn  ?Burning in the throat or an acid taste in the throat  ?Stomach or chest pain  ?Trouble swallowing  ?Having a raspy voice or a sore throat  ?Unexplained cough  Is there anything I can do on my own to improve my symptoms?--Yes. You might feel better if you:  ?Lose weight (if you are overweight)  ?Raise the head of your bed by 6 to 8 inches (for example, by putting blocks of wood under 2 legs of the bed or a Styrofoam wedge under the mattress)  ?Avoid foods that make your symptoms worse (examples include coffee, chocolate, alcohol, peppermint, and fatty foods)   ?Cut down on the amount of alcohol you drink   ?Stop smoking, if you smoke  ?Eat a bunch of small meals each day, rather than 2 or 3 big meals  ?Avoid lying down for 3 hours after a meal  What treatments can help with my acid reflux?--There are a few main types of medicines that can help with the symptoms of acid reflux: antacids, surface acting agents, histamine blockers, and proton pump inhibitors (table 1). All  of these medicines work by reducing or blocking stomach acid. But they each do that in a different way.   Antacids and surface acting agents can relieve mild symptoms, but they work only for a short time. Histamine blockers are stronger and last longer than antacids and surface acting agents. You can buy antacids and most histamine blockers without a prescription.   Proton pump inhibitors are the most effective medicines in treating GERD. Some of these medicines are sold without a prescription. But there are other versions that your doctor or nurse can prescribe.   Sometimes acid reflux medicines are less expensive if you get them with a prescription. Other times nonprescription medicines are less expensive. If cost is a concern for you, ask your pharmacist how you might reduce the cost of your medicines.   Should I see a doctor or nurse about my acid reflux?--Some people can manage their acid reflux on their own by changing their habits or taking nonprescription medicines. But you should see a doctor or nurse if:   ?Your symptoms are severe or last a long time  ?You cannot seem to control your symptoms  You should also see a doctor or nurse right away if you:  ?Have trouble swallowing, or feel as though food gets stuck on  the way down  ?Lose weight when you are not trying to  ?Have chest pain  ?Choke when you eat  ?Vomit blood or have bowel movements that are black or look like tar  What if my child or teenager has acid reflux?--If your child or teenager has acid reflux, take him or her to see a doctor or nurse. Do not give your child medicines to treat acid reflux without talking to a doctor or nurse.  In children, acid reflux can be caused by a number of problems. It's important to have a doctor or nurse check for these problems before trying any treatments.  All topics are updated as new evidence becomes available and our peer review process is complete.  This topic retrieved from UpToDate on: Jul 04, 2013.  Topic 15325 Version 6.0  Release: 23.3 - C23.74  2015UpToDate, Inc.All rights reserved.  figure 1: Upper digestive tract     Graphic [phone] Version 4.0  table 1: Medicines used to reduce stomach acid  Medicine type  Medicine name examples    Antacids Calcium carbonate (sample brand name: Tums)    Aluminum hydroxide, magnesium hydroxide, and simethicone (sample brand name: Maalox)*   Surface agents Sucralfate (brand name: Carafate)   Histamine blockers Ranitidine (brand name: Zantac)    Famotidine (brand name: Pepcid)    Cimetidine (brand name: Tagamet)   Proton pump inhibitors Omeprazole (brand name: Prilosec)    Esomeprazole (brand name: Nexium)    Pantoprazole (brand name: Protonix)    Lansoprazole (brand name: Prevacid)    Dexlansoprazole (brand name: Dexilant)    Rabeprazole (brand name: AcipHex)   Graphic [phone] Version 8.0  Consumer Information Use and Disclaimer   This information is not specific medical advice and does not replace information you receive from your health care provider. This is only a brief summary of general information. It does NOT include all information about conditions, illnesses, injuries, tests, procedures, treatments, therapies, discharge instructions or life-style choices that may apply to you. You must talk with your health care provider for complete information about your health and treatment options. This information should not be used to decide whether or not to accept your health care provider's advice, instructions or recommendations. Only your health care provider has the knowledge and training to provide advice that is right for you.The use of UpToDate content is governed by the UpToDate Terms of Use. 2015 [employer]Date, [place] rights reserved.  Copyright   2015UpToDate, Inc.All rights reserved.

## 2016-07-08 NOTE — Progress Notes (Signed)
[facility] Progress Note    Chief Complaint:    Chief Complaint   Patient presents with    Migraine    Hypertension    Asthma    Gastrophageal Reflux            HPI      Jenna [name] is a 31 y.o. female with a PMHx significant for recent desired pregnancy, cocaine abuse (tested positive 06/02/16), depression, tobacco abuse, obesity, and hidradenitis who presents for follow-up of recent c-section.    Recent pregnancy s/p C-section  - Recent [facility] admission 4/12-4/15/18 at [redacted]w[redacted]d with severe gestational HTN, now s/p 4th repeat c-section c/b dense fascial and intra-abdominal adhesion, with uncomplicated post-op course.  - No trouble with wound healing. Incision "looks perfectly fine."  - Baby's name is Mykaia.  - Mood has been good.    Migraines  - "It feels like my brain is going to explode." Headaches started post-partum. Never got headaches prior.  - Starts behind her head. Pain located behind the eyes and in the temples. Each episode is unilateral. Feels like a throbbing sensation.  - No photophobia or phonophobia.  - Occurring on a daily basis. Each one lasts hours to a day.  - Currently taking ibuprofen and tylenol on a daily basis.    HTN  - Not currently taking antihypertensive medication.  - Frequent headaches as above.  - Not measuring BPs at home.    Asthma  - Using albuterol 3x daily for wheezing and SOB. Wakes up from sleep "all the time," almost every night.  - Complains of frequent wheezing and DOE.  - Currently smokes cigarettes, 2 per day.    Acid reflux  - Complains of heart burn "anytime I eat anything greasy or spicy."  - Worst at night when lying down in bed.      1. Essential hypertension    2. Encounter for smoking cessation counseling    3. Gastroesophageal reflux disease, esophagitis presence not specified    4. Chronic migraine without aura without status migrainosus, not intractable    5. Unspecified asthma, uncomplicated    6. Hidradenitis    7. Healthcare maintenance         Patient's allergies, past medical, surgical, social and family histories were reviewed, updated.       Medications:     Current Outpatient Prescriptions on File Prior to Visit   Medication Sig Dispense Refill    acetaminophen (TYLENOL) 500 mg tablet Take 2 tablets (1,000 mg total) by mouth every 6 hours as needed for Pain 60 tablet 1    albuterol HFA (VENTOLIN HFA) 108 (90 BASE) MCG/ACT inhaler Inhale 1-2 puffs into the lungs every 6 hours as needed    FOR WHEEZING 18 g 5    docusate sodium (COLACE) 100 MG capsule Take 1 capsule (100 mg total) by mouth 2 times daily      breast pump Brand per insurance coverage. Use as directed. 1 each 0     No current facility-administered medications on file prior to visit.        Medications reviewed and reconciled.     Review of Systems:     As above, and:  General: negative for weight change, fever/chills  HEENT: POS for headache; negative for change in vision, sore throat  Cardiovascular: negative for chest pain, palpitations, dyspnea on exertion  Pulmonary: POS for cough and wheezing; negative for SOB, cough, sputum production, wheezing  Gastrointestinal: negative for abdominal pain, nausea/vomiting, constipation/diarrhea,  or blood in stool  Genitourinary: negative for dysuria, increased frequency/urgency, or hematuria  Hematology: negative for easy bruising or new blood clots  Endocrine: negative for cold/heat intolerance, or increased thirst  Neurologic: negative for weakness, paresthesias, numbness  Musculoskeletal: negative for new joint pain, muscle aches  Skin: negative for new rash, sores  Psychiatric: negative for mood changes, depression, hallucinations      Physical Exam:     Vitals:    07/08/16 1442 07/08/16 1509   BP: (!) 144/100 140/90   BP Location:  Left arm   Patient Position:  Sitting   Cuff Size:  large adult   Pulse: 80    Temp: 36.6 C (97.9 F)    TempSrc: Temporal    Weight: 131.9 kg (290 lb 12.8 oz)    Height: 1.651 m ( )      Wt Readings  from Last 3 Encounters:   07/08/16 131.9 kg (290 lb 12.8 oz)   06/24/16 (!) 142.4 kg (314 lb)   06/24/16 (!) 142.4 kg (314 lb)       General: Well-appearing, well groomed, cooperative. Not accompanied. No assistive devices.  HEENT: Sclera anicteric, oropharynx without lesion  Cardiovascular: RRR, NL S1 and S2. No murmur, rub, or gallop  Respiratory: Clear to auscultation bilaterally. No crackles, wheezes, or rhonchi.  Abdomen: Obese. Normoactive BS, soft, non-tender.  Extremities/MSK: No LE edema.   Neurologic: Grossly intact.  Psychiatric: Appropriate affect.  Skin: No rash observed.    Labs:   Reviewed and notable for:    06/25/16  BMP  - Na 135  - K 4.3  - BUN 9  - Cr 0.87  - Glu 92  - Ca 7.7    CBC  - WBC 13.9  - Hgb 6.4  - Hct 21      Assessment and Plan:     Jenna [name] is a 31 y.o. female with a PMHx significant for desired pregnancy, cocaine abuse (tested positive 06/02/16), depression, tobacco abuse, obesity, and hidradenitis who presents for follow-up of recent c-section.    HTN - Goal <130/80. Above goal in clinic. Hx of severe gestational HTN with recent pregnancy. Given concurrent migraines, will start BB. Pt will start checking home BPs.  - Start toprol-XL 25 mg daily  - Home BP monitor -- will check home BPs 2-3x weekly    Asthma - Poorly controlled. Using rescue inhaler daily, waking up from sleep nightly. Frequent SOB, DOE, and wheezing. Will start ICS/LABA given severity of symptoms.  - Albuterol q4h PRN  - Start fluticasone-salmeterol (Advair) 100-50 mcg/dose    GERD - Frequent heartburn, triggered by greasy and spicy foods. Exacerbated by lying down. Given concurrent asthma, will treat with PPI.  - Start pantoprazole 20 mg daily  - Continue Tums PRN  - Counseled on diet and weight loss  - Reviewed common triggers -- given written information (shown below)    Smoking cessation - Currently weaned to 2 cigarettes/day. Very enthusiastic about quitting. Concerned about effect of cigarette smoke on her  asthma, and on her new baby. Plan for >3-6 mo course of Chantix. Pt set quit date 2 wks from now.  - Start Chantix 0.5 mg daily x3 days, then 0.5 mg BID x4 days, then  BID    Migraines - Unilateral, severe headaches, which last for several hours. Possibly related to post-partum HTN and poor sleep. Encouraged to avoid daily NSAID use, counseled on rebound headaches.  - BB as above    Health  Maintenance  - Check A1c  - Check fasting lipid panel      Follow-up: 4 weeks with NP      Health Maintenance Up-to-date:  - Pap Smear: 02/12/16  - Immunizations:    Influenza: 12/15/15   Tdap: 06/15/16      Diagnosis Medications/Orders:     ICD-10-CM ICD-9-CM    1. Essential hypertension I10 401.9 blood pressure monitor      metoprolol (TOPROL-XL) 25 MG 24 hr tablet   2. Encounter for smoking cessation counseling Z71.6 V65.42 varenicline (CHANTIX STARTING PAK) 0.5 MG X 11 & 1 MG X 42 tablet pak    Z72.0 305.1    3. Gastroesophageal reflux disease, esophagitis presence not specified K21.9 530.81 pantoprazole (PROTONIX) 20 MG EC tablet   4. Chronic migraine without aura without status migrainosus, not intractable G43.709 346.70 metoprolol (TOPROL-XL) 25 MG 24 hr tablet   5. Unspecified asthma, uncomplicated J45.909 493.90 fluticasone-salmeterol (ADVAIR) 100-50 MCG/DOSE diskus inhaler   6. Hidradenitis L73.2 705.83 chlorhexidine (HIBICLENS) 4 % external liquid   7. Healthcare maintenance Z00.00 V70.0 Hemoglobin A1c      Lipid panel      Return in about 4 weeks (around 08/05/2016) for Follow-up, Next Visit with NP or PA, asthma and migraines.   Medications Discontinued During This Encounter   Medication Reason    triamcinolone (KENALOG) 0.1 % ointment Clinically indicated    prenatal plus iron (PRENAVITE) tablet Clinically indicated    Emollient (EUCERIN INTENSIVE REPAIR) LOTN Clinically indicated    chlorhexidine (HIBICLENS) 4 % external liquid Reorder       [facility] Hypertension Plan  BP Goal:  Based on this patients clinical  history and JNC guidelines target BP goal is: less than 130/80  Based on the patient's last BP of BP: 140/90 the patient is: above goal  Treatment plans to reach/maintain goal:  1.  Reviewed pt's understanding of medications including any barriers to adherence.  2.  Recommended lifestyle modifications: weight reduction and medication compliance  3.  The patient understands their clinical goals and will undertake self-management recommendations including:weight reduction  medication compliance    4.  Medication Management: the following medication changes were made today: Start toprol-XL 25 mg daily.   5.  Referral to Care Management:: No  6.  Patient Ed/Self-management tools provided: No    [facility]MH Smoking Cessation Plan  Discussed smoking cessation with patient. Patient readiness to quit: Yes  Discussed/counseled with patient smoking cessation plan according to USPSTF guidelines: I advised patient to quit, and offered support.      Sharol Roussel, MD 07/08/2016 8:06 PM  Internal Medicine PGY-1  PIC# [id number]    Patient Instructions     1. Please pick up your medications at the [facility] near the lobby.    2. Start taking advair 2x daily for your asthma as a controller medication. Continue albuterol as needed.    3. Start taking pantoprazole daily for acid reflux.    4. Start taking toprol-XL daily for blood pressure and migraines.    5. Start checking blood pressure 2-3x weekly.    6. Start taking chantix. Plan to quit after 2 weeks.    7. Stop by the lab to check blood work.          Acid Reflux (Gastroesophageal Reflux Disease) in Adults    The Basics   Written by the doctors and editors at UpToDate   What is acid reflux?--Acid reflux is when the acid that is normally  in your stomach backs up into the esophagus, tube that carries food from your mouth to your stomach (figure 1). Another term for acid reflux is gastroesophageal reflux disease, or GERD.  What are the symptoms of acid reflux?--The  symptoms include:   ?Burning in the chest, known as heartburn  ?Burning in the throat or an acid taste in the throat  ?Stomach or chest pain  ?Trouble swallowing  ?Having a raspy voice or a sore throat  ?Unexplained cough  Is there anything I can do on my own to improve my symptoms?--Yes. You might feel better if you:  ?Lose weight (if you are overweight)  ?Raise the head of your bed by 6 to 8 inches (for example, by putting blocks of wood under 2 legs of the bed or a Styrofoam wedge under the mattress)  ?Avoid foods that make your symptoms worse (examples include coffee, chocolate, alcohol, peppermint, and fatty foods)   ?Cut down on the amount of alcohol you drink   ?Stop smoking, if you smoke  ?Eat a bunch of small meals each day, rather than 2 or 3 big meals  ?Avoid lying down for 3 hours after a meal  What treatments can help with my acid reflux?--There are a few main types of medicines that can help with the symptoms of acid reflux: antacids, surface acting agents, histamine blockers, and proton pump inhibitors (table 1). All of these medicines work by reducing or blocking stomach acid. But they each do that in a different way.   Antacids and surface acting agents can relieve mild symptoms, but they work only for a short time. Histamine blockers are stronger and last longer than antacids and surface acting agents. You can buy antacids and most histamine blockers without a prescription.   Proton pump inhibitors are the most effective medicines in treating GERD. Some of these medicines are sold without a prescription. But there are other versions that your doctor or nurse can prescribe.   Sometimes acid reflux medicines are less expensive if you get them with a prescription. Other times nonprescription medicines are less expensive. If cost is a concern for you, ask your pharmacist how you might reduce the cost of your medicines.   Should I see a doctor or nurse about my acid reflux?--Some people can manage  their acid reflux on their own by changing their habits or taking nonprescription medicines. But you should see a doctor or nurse if:   ?Your symptoms are severe or last a long time  ?You cannot seem to control your symptoms  You should also see a doctor or nurse right away if you:  ?Have trouble swallowing, or feel as though food gets stuck on the way down  ?Lose weight when you are not trying to  ?Have chest pain  ?Choke when you eat  ?Vomit blood or have bowel movements that are black or look like tar  What if my child or teenager has acid reflux?--If your child or teenager has acid reflux, take him or her to see a doctor or nurse. Do not give your child medicines to treat acid reflux without talking to a doctor or nurse.  In children, acid reflux can be caused by a number of problems. It's important to have a doctor or nurse check for these problems before trying any treatments.  All topics are updated as new evidence becomes available and our peer review process is complete.  This topic retrieved from UpToDate on: Jul 04, 2013.  Topic  16109 Version 6.0  Release: 23.3 - C23.74  2015UpToDate, Inc.All rights reserved.  figure 1: Upper digestive tract     Graphic 60454 Version 4.0  table 1: Medicines used to reduce stomach acid  Medicine type  Medicine name examples    Antacids Calcium carbonate (sample brand name: Tums)    Aluminum hydroxide, magnesium hydroxide, and simethicone (sample brand name: Maalox)*   Surface agents Sucralfate (brand name: Carafate)   Histamine blockers Ranitidine (brand name: Zantac)    Famotidine (brand name: Pepcid)    Cimetidine (brand name: Tagamet)   Proton pump inhibitors Omeprazole (brand name: Prilosec)    Esomeprazole (brand name: Nexium)    Pantoprazole (brand name: Protonix)    Lansoprazole (brand name: Prevacid)    Dexlansoprazole (brand name: Dexilant)    Rabeprazole (brand name: AcipHex)   Graphic 09811 Version 8.0  Consumer Information Use and Disclaimer   This  information is not specific medical advice and does not replace information you receive from your health care provider. This is only a brief summary of general information. It does NOT include all information about conditions, illnesses, injuries, tests, procedures, treatments, therapies, discharge instructions or life-style choices that may apply to you. You must talk with your health care provider for complete information about your health and treatment options. This information should not be used to decide whether or not to accept your health care provider's advice, instructions or recommendations. Only your health care provider has the knowledge and training to provide advice that is right for you.The use of UpToDate content is governed by the UpToDate Terms of Use. 2015 UpToDate, Inc. All rights reserved.  Copyright   2015UpToDate, Inc.All rights reserved.

## 2016-07-12 ENCOUNTER — Encounter: Payer: Self-pay | Admitting: Obstetrics & Gynecology

## 2016-07-12 DIAGNOSIS — G43909 Migraine, unspecified, not intractable, without status migrainosus: Secondary | ICD-10-CM | POA: Insufficient documentation

## 2016-07-12 DIAGNOSIS — Z0000 Encounter for general adult medical examination without abnormal findings: Secondary | ICD-10-CM | POA: Insufficient documentation

## 2016-07-12 DIAGNOSIS — K219 Gastro-esophageal reflux disease without esophagitis: Secondary | ICD-10-CM | POA: Insufficient documentation

## 2016-07-12 DIAGNOSIS — Z98891 History of uterine scar from previous surgery: Secondary | ICD-10-CM | POA: Insufficient documentation

## 2016-07-13 ENCOUNTER — Ambulatory Visit: Payer: Medicaid (Managed Care) | Admitting: Obstetrics & Gynecology

## 2016-07-13 ENCOUNTER — Telehealth: Payer: Self-pay | Admitting: Internal Medicine

## 2016-07-13 NOTE — Telephone Encounter (Signed)
Spoke to patient in regards to her transportation. I advised patient that we will complete another form for her.     Patient stated that she understood. I advised patient that if she runs into issues scheduling that I could assist

## 2016-07-13 NOTE — Telephone Encounter (Signed)
Jenna [name] is calling to check the status of her form that Dr. Leodis Liverpool was suppose to submit to [facility] for cab transportation. Please return her call at [phone] to discuss.    *last seen on 07/08/16

## 2016-07-14 ENCOUNTER — Telehealth: Payer: Self-pay

## 2016-07-14 NOTE — Telephone Encounter (Signed)
Transportation form was completed and faxed back

## 2016-07-14 NOTE — Telephone Encounter (Signed)
Called to reschedule postpartum visit, Pt stated she will "seek care elsewhere because [facility] took my baby".  Writer gave pt the [facility] number if she needed to reach [name] in the future.

## 2016-07-30 ENCOUNTER — Other Ambulatory Visit: Payer: Self-pay | Admitting: Internal Medicine

## 2016-07-30 NOTE — Progress Notes (Signed)
Did not call pt. Pt is pregnant    Immunizations:          Pneumovax/Prevnar 13:                                                                              If > 31 yo   Overdue: Yes        Agrees to schedule:  unknown    *If no Pneumovax, then Prevnar 13 (PCV23)     Order code: [id number]   If Prevnar 13 then Pneumovax > or = 12 month later (PVC12)    Order code: [id number]

## 2016-08-02 ENCOUNTER — Ambulatory Visit: Payer: Medicaid (Managed Care) | Admitting: Obstetrics and Gynecology

## 2016-08-07 DIAGNOSIS — I10 Essential (primary) hypertension: Secondary | ICD-10-CM | POA: Insufficient documentation

## 2016-08-20 ENCOUNTER — Ambulatory Visit (HOSPITAL_COMMUNITY)
Admission: EM | Admit: 2016-08-20 | Discharge: 2016-08-20 | Disposition: A | Discharge status: Home / Self Care | Payer: Medicaid Other | Attending: Physician Assistant | Admitting: Physician Assistant

## 2016-08-20 ENCOUNTER — Encounter (HOSPITAL_COMMUNITY): Payer: Self-pay | Admitting: *Deleted

## 2016-08-20 DIAGNOSIS — R300 Dysuria: Secondary | ICD-10-CM | POA: Diagnosis not present

## 2016-08-20 DIAGNOSIS — N3000 Acute cystitis without hematuria: Secondary | ICD-10-CM | POA: Diagnosis not present

## 2016-08-20 DIAGNOSIS — Z87442 Personal history of urinary calculi: Secondary | ICD-10-CM | POA: Diagnosis not present

## 2016-08-20 DIAGNOSIS — Z3202 Encounter for pregnancy test, result negative: Secondary | ICD-10-CM | POA: Diagnosis not present

## 2016-08-20 LAB — POCT URINALYSIS DIP (DEVICE)
Bilirubin Urine: NEGATIVE
Glucose, UA: NEGATIVE mg/dL
Ketones, ur: NEGATIVE mg/dL
Nitrite: NEGATIVE
PH: 5.5 (ref 5.0–8.0)
PROTEIN: 30 mg/dL — AB
SPECIFIC GRAVITY, URINE: 1.015 (ref 1.005–1.030)
Urobilinogen, UA: 0.2 mg/dL (ref 0.0–1.0)

## 2016-08-20 LAB — POCT PREGNANCY, URINE: Preg Test, Ur: NEGATIVE

## 2016-08-20 MED ORDER — NITROFURANTOIN MONOHYD MACRO 100 MG PO CAPS: 100.0000 mg | ORAL_CAPSULE | Freq: Two times a day (BID) | ORAL | 0 refills | Status: DC

## 2016-08-20 NOTE — ED Triage Notes (Signed)
Pt  Reports  A  History  Of   Kidney  Stones   She  States   She  Has  Passed   Stones  In  The  Past   She  Reports   Some  Discomfort  On  Urination       She  Rates  Her  Pain at a  5

## 2016-08-20 NOTE — Discharge Instructions (Signed)
We are going to treat you for an infection in hopes that is still causing your pain. Suggest a lot of water and use Ibuprofen 800mg  every 8 hours for pain if needed. If you are not improving by next week then f/u with Urology. If worsen then please go to the ED for further evaluation and scan. Feel better.

## 2016-08-20 NOTE — ED Provider Notes (Signed)
CSN: [id number]     Arrival date & time 08/20/16  1426 History   First MD Initiated Contact with Patient 08/20/16 1457     No chief complaint on file.  (Consider location/radiation/quality/duration/timing/severity/associated sxs/prior Treatment) 31 yo caucasian female presents with flank pain. She carries a history of nephrolithiasis in the past and started passing stones last week. She has passed 3. Her back continues to bother on a 5 scale and she now has dysuria. She questions another stone vs. UTI. No gross hematuria is noted. No fever or chills. No N, V.       Past Medical History:  Diagnosis Date  . Anxiety   . Depressed   . Kidney stone   . Migraine    Past Surgical History:  Procedure Laterality Date  . CESAREAN SECTION    . MANDIBLE SURGERY     History reviewed. No pertinent family history. Social History  Substance Use Topics  . Smoking status: Former Games developer  . Smokeless tobacco: Never Used  . Alcohol use Yes     Comment: occasionally   OB History    No data available     Review of Systems  Constitutional: Negative for fatigue and fever.  Gastrointestinal: Negative for abdominal distention, nausea and vomiting.       +Flank pain-  Genitourinary: Positive for dysuria and flank pain. Negative for decreased urine volume, frequency, hematuria, pelvic pain and urgency.  Musculoskeletal: Positive for back pain.  Skin: Negative for rash.    Allergies  Sulfa antibiotics  Home Medications   Prior to Admission medications   Medication Sig Start Date End Date Taking? Authorizing Provider  benzonatate (TESSALON) 100 MG capsule Take 1 capsule (100 mg total) by mouth every 8 (eight) hours. 04/04/16   Junius Finner, PA-C  clindamycin (CLEOCIN) 150 MG capsule Take 1 capsule (150 mg total) by mouth 4 (four) times daily. 03/16/16   Linna Hoff, MD  fluticasone (FLONASE) 50 MCG/ACT nasal spray Place 2 sprays into both nostrils daily. For at least 2 weeks 04/04/16    Junius Finner, PA-C  ipratropium (ATROVENT) 0.06 % nasal spray Place 2 sprays into both nostrils 4 (four) times daily. 03/16/16   Linna Hoff, MD  nitrofurantoin, macrocrystal-monohydrate, (MACROBID) 100 MG capsule Take 1 capsule (100 mg total) by mouth 2 (two) times daily. 08/20/16   Riki Sheer, PA-C  PARoxetine (PAXIL) 30 MG tablet Take 30 mg by mouth daily.    [provider]   Meds Ordered and Administered this Visit  Medications - No data to display  BP 120/78 (BP Location: Right Arm)   Pulse 78   Temp 98.6 F (37 C) (Oral)   Resp 18   LMP 08/02/2016   SpO2 100%  No data found.   Physical Exam  Constitutional: She is oriented to person, place, and time. She appears well-developed and well-nourished. No distress.  Comfortable on the exam table without acute distress  Abdominal: Soft. She exhibits no distension. There is no tenderness. There is no guarding.  Mild supra pubic pain with palpation-mid line, no pain with flank percussion  Neurological: She is alert and oriented to person, place, and time.  Skin: Skin is warm and dry. No rash noted. She is not diaphoretic.  Psychiatric: Her behavior is normal.  Nursing note and vitals reviewed.   Urgent Care Course     Procedures (including critical care time)  Labs Review Labs Reviewed  POCT URINALYSIS DIP (DEVICE) - Abnormal; Notable for the  following:       Result Value   Hgb urine dipstick SMALL (*)    Protein, ur 30 (*)    Leukocytes, UA SMALL (*)    All other components within normal limits  POCT PREGNANCY, URINE    Imaging Review No results found.   Visual Acuity Review  Right Eye Distance:   Left Eye Distance:   Bilateral Distance:    Right Eye Near:   Left Eye Near:    Bilateral Near:         MDM   1. History of nephrolithiasis   2. Acute cystitis without hematuria    Difficult to know if stone remains present without a CT. No oliguria is noted to suggest obstruction. No  nitrates on U/A but small WBC's.  Unable to use Flomax with sulfa allergy. Will cover for infection and continue to use strainer. Suggest that if her pain worsens go to the ED for further evaluation. Otherwise suggest routine follow up with [facility] for ongoing pain. May use ibuprofen 800mg  every 8 hours for discomfort.     Riki SheerYoung, Zekiah Caruth G, [place] JerseyPA-C 08/20/16 [phone]

## 2016-08-24 ENCOUNTER — Other Ambulatory Visit: Payer: Self-pay | Admitting: Urology

## 2016-08-24 ENCOUNTER — Encounter (HOSPITAL_COMMUNITY): Payer: Self-pay | Admitting: *Deleted

## 2016-08-24 ENCOUNTER — Encounter (HOSPITAL_COMMUNITY)
Admission: AD | Disposition: A | Discharge status: Home / Self Care | Payer: Self-pay | Source: Ambulatory Visit | Attending: Urology

## 2016-08-24 ENCOUNTER — Inpatient Hospital Stay (HOSPITAL_COMMUNITY)
Admission: AD | Admit: 2016-08-24 | Discharge: 2016-08-26 | DRG: 854 | Disposition: A | Discharge status: Home / Self Care | Payer: Medicaid Other | Source: Ambulatory Visit | Attending: Urology | Admitting: Urology

## 2016-08-24 ENCOUNTER — Ambulatory Visit (HOSPITAL_COMMUNITY): Payer: Medicaid Other | Admitting: Registered Nurse

## 2016-08-24 ENCOUNTER — Ambulatory Visit (HOSPITAL_COMMUNITY): Payer: Medicaid Other

## 2016-08-24 DIAGNOSIS — Z87891 Personal history of nicotine dependence: Secondary | ICD-10-CM | POA: Diagnosis not present

## 2016-08-24 DIAGNOSIS — F329 Major depressive disorder, single episode, unspecified: Secondary | ICD-10-CM | POA: Diagnosis present

## 2016-08-24 DIAGNOSIS — R109 Unspecified abdominal pain: Secondary | ICD-10-CM | POA: Diagnosis present

## 2016-08-24 DIAGNOSIS — Z79899 Other long term (current) drug therapy: Secondary | ICD-10-CM | POA: Diagnosis not present

## 2016-08-24 DIAGNOSIS — F419 Anxiety disorder, unspecified: Secondary | ICD-10-CM | POA: Diagnosis present

## 2016-08-24 DIAGNOSIS — N132 Hydronephrosis with renal and ureteral calculous obstruction: Secondary | ICD-10-CM | POA: Diagnosis present

## 2016-08-24 DIAGNOSIS — N390 Urinary tract infection, site not specified: Secondary | ICD-10-CM | POA: Diagnosis present

## 2016-08-24 DIAGNOSIS — A419 Sepsis, unspecified organism: Secondary | ICD-10-CM | POA: Diagnosis present

## 2016-08-24 DIAGNOSIS — A4151 Sepsis due to Escherichia coli [E. coli]: Secondary | ICD-10-CM | POA: Diagnosis present

## 2016-08-24 HISTORY — DX: Personal history of urinary calculi: Z87.442

## 2016-08-24 HISTORY — PX: CYSTOSCOPY W/ URETERAL STENT PLACEMENT: SHX1429

## 2016-08-24 LAB — BASIC METABOLIC PANEL
ANION GAP: 12 (ref 5–15)
BUN: 12 mg/dL (ref 6–20)
CO2: 23 mmol/L (ref 22–32)
Calcium: 8.8 mg/dL — ABNORMAL LOW (ref 8.9–10.3)
Chloride: 102 mmol/L (ref 101–111)
Creatinine, Ser: 0.99 mg/dL (ref 0.44–1.00)
GLUCOSE: 153 mg/dL — AB (ref 65–99)
POTASSIUM: 3.4 mmol/L — AB (ref 3.5–5.1)
Sodium: 137 mmol/L (ref 135–145)

## 2016-08-24 LAB — CBC
HEMATOCRIT: 35.9 % — AB (ref 36.0–46.0)
Hemoglobin: 11.9 g/dL — ABNORMAL LOW (ref 12.0–15.0)
MCH: 28.8 pg (ref 26.0–34.0)
MCHC: 33.1 g/dL (ref 30.0–36.0)
MCV: 86.9 fL (ref 78.0–100.0)
PLATELETS: 276 10*3/uL (ref 150–400)
RBC: 4.13 MIL/uL (ref 3.87–5.11)
RDW: 12.8 % (ref 11.5–15.5)
WBC: 24.4 10*3/uL — AB (ref 4.0–10.5)

## 2016-08-24 SURGERY — CYSTOSCOPY, WITH RETROGRADE PYELOGRAM AND URETERAL STENT INSERTION
Anesthesia: General | Laterality: Right

## 2016-08-24 MED ORDER — SODIUM CHLORIDE 0.9 % IV SOLN
INTRAVENOUS | Status: DC | PRN
  Administered 2016-08-24: 19:00:00

## 2016-08-24 MED ORDER — PHENOL 1.4 % MT LIQD
1.0000 | OROMUCOSAL | Status: DC | PRN
  Administered 2016-08-24: 1 via OROMUCOSAL
  Filled 2016-08-24: qty 177

## 2016-08-24 MED ORDER — DIPHENHYDRAMINE HCL 50 MG/ML IJ SOLN
12.5000 mg | Freq: Four times a day (QID) | INTRAMUSCULAR | Status: DC | PRN
Start: 2016-08-24 — End: 2016-08-26

## 2016-08-24 MED ORDER — LACTATED RINGERS IV SOLN
INTRAVENOUS | Status: DC
  Administered 2016-08-24: 18:00:00 via INTRAVENOUS

## 2016-08-24 MED ORDER — PHENYLEPHRINE 40 MCG/ML (10ML) SYRINGE FOR IV PUSH (FOR BLOOD PRESSURE SUPPORT)
PREFILLED_SYRINGE | INTRAVENOUS | Status: AC
  Filled 2016-08-24: qty 10

## 2016-08-24 MED ORDER — HYOSCYAMINE SULFATE 0.125 MG SL SUBL
0.1250 mg | SUBLINGUAL_TABLET | SUBLINGUAL | Status: DC | PRN
Start: 2016-08-24 — End: 2016-08-26
  Filled 2016-08-24: qty 1

## 2016-08-24 MED ORDER — ACETAMINOPHEN 325 MG PO TABS
650.0000 mg | ORAL_TABLET | ORAL | Status: DC | PRN
  Administered 2016-08-25 – 2016-08-26 (×3): 650 mg via ORAL
  Filled 2016-08-24 (×3): qty 2

## 2016-08-24 MED ORDER — PROPOFOL 10 MG/ML IV BOLUS
INTRAVENOUS | Status: AC
  Filled 2016-08-24: qty 20

## 2016-08-24 MED ORDER — PHENYLEPHRINE 40 MCG/ML (10ML) SYRINGE FOR IV PUSH (FOR BLOOD PRESSURE SUPPORT)
PREFILLED_SYRINGE | INTRAVENOUS | Status: DC | PRN
  Administered 2016-08-24 (×2): 40 ug via INTRAVENOUS

## 2016-08-24 MED ORDER — MEPERIDINE HCL 50 MG/ML IJ SOLN: 6.2500 mg | INTRAMUSCULAR | Status: DC | PRN

## 2016-08-24 MED ORDER — ONDANSETRON HCL 4 MG/2ML IJ SOLN
INTRAMUSCULAR | Status: AC
  Filled 2016-08-24: qty 2

## 2016-08-24 MED ORDER — FENTANYL CITRATE (PF) 100 MCG/2ML IJ SOLN
INTRAMUSCULAR | Status: DC | PRN
Start: 2016-08-24 — End: 2016-08-24
  Administered 2016-08-24: 150 ug via INTRAVENOUS

## 2016-08-24 MED ORDER — DIPHENHYDRAMINE HCL 12.5 MG/5ML PO ELIX: 12.5000 mg | ORAL_SOLUTION | Freq: Four times a day (QID) | ORAL | Status: DC | PRN

## 2016-08-24 MED ORDER — DEXAMETHASONE SODIUM PHOSPHATE 10 MG/ML IJ SOLN
INTRAMUSCULAR | Status: DC | PRN
  Administered 2016-08-24: 10 mg via INTRAVENOUS

## 2016-08-24 MED ORDER — ZOLPIDEM TARTRATE 5 MG PO TABS: 5.0000 mg | ORAL_TABLET | Freq: Every evening | ORAL | Status: DC | PRN

## 2016-08-24 MED ORDER — SUCCINYLCHOLINE CHLORIDE 200 MG/10ML IV SOSY
PREFILLED_SYRINGE | INTRAVENOUS | Status: AC
  Filled 2016-08-24: qty 10

## 2016-08-24 MED ORDER — EPHEDRINE SULFATE-NACL 50-0.9 MG/10ML-% IV SOSY
PREFILLED_SYRINGE | INTRAVENOUS | Status: DC | PRN
Start: 2016-08-24 — End: 2016-08-24

## 2016-08-24 MED ORDER — ONDANSETRON HCL 4 MG/2ML IJ SOLN: 4.0000 mg | Freq: Once | INTRAMUSCULAR | Status: DC | PRN

## 2016-08-24 MED ORDER — HYDROMORPHONE HCL 1 MG/ML IJ SOLN
0.2500 mg | INTRAMUSCULAR | Status: DC | PRN
Start: 2016-08-24 — End: 2016-08-24

## 2016-08-24 MED ORDER — FENTANYL CITRATE (PF) 250 MCG/5ML IJ SOLN
INTRAMUSCULAR | Status: AC
  Filled 2016-08-24: qty 5

## 2016-08-24 MED ORDER — MIDAZOLAM HCL 2 MG/2ML IJ SOLN
INTRAMUSCULAR | Status: AC
  Filled 2016-08-24: qty 2

## 2016-08-24 MED ORDER — ONDANSETRON HCL 4 MG/2ML IJ SOLN
INTRAMUSCULAR | Status: DC | PRN
  Administered 2016-08-24: 4 mg via INTRAVENOUS

## 2016-08-24 MED ORDER — DEXAMETHASONE SODIUM PHOSPHATE 10 MG/ML IJ SOLN
INTRAMUSCULAR | Status: AC
  Filled 2016-08-24: qty 1

## 2016-08-24 MED ORDER — SUCCINYLCHOLINE CHLORIDE 200 MG/10ML IV SOSY
PREFILLED_SYRINGE | INTRAVENOUS | Status: DC | PRN
  Administered 2016-08-24: 140 mg via INTRAVENOUS

## 2016-08-24 MED ORDER — ONDANSETRON HCL 4 MG/2ML IJ SOLN: 4.0000 mg | INTRAMUSCULAR | Status: DC | PRN

## 2016-08-24 MED ORDER — HYDROMORPHONE HCL 1 MG/ML IJ SOLN: 0.5000 mg | INTRAMUSCULAR | Status: DC | PRN

## 2016-08-24 MED ORDER — DEXTROSE 5 % IV SOLN
2.0000 g | INTRAVENOUS | Status: AC
  Administered 2016-08-24: 2 g via INTRAVENOUS

## 2016-08-24 MED ORDER — PROPOFOL 10 MG/ML IV BOLUS
INTRAVENOUS | Status: DC | PRN
  Administered 2016-08-24: 130 mg via INTRAVENOUS

## 2016-08-24 MED ORDER — MIDAZOLAM HCL 5 MG/5ML IJ SOLN
INTRAMUSCULAR | Status: DC | PRN
  Administered 2016-08-24: 2 mg via INTRAVENOUS

## 2016-08-24 MED ORDER — DEXTROSE 5 % IV SOLN
INTRAVENOUS | Status: AC
  Filled 2016-08-24: qty 2

## 2016-08-24 MED ORDER — SODIUM CHLORIDE 0.9 % IV SOLN
INTRAVENOUS | Status: DC
  Administered 2016-08-24 – 2016-08-26 (×4): via INTRAVENOUS

## 2016-08-24 MED ORDER — PAROXETINE HCL 20 MG PO TABS: 30.0000 mg | ORAL_TABLET | Freq: Every day | ORAL | Status: DC

## 2016-08-24 MED ORDER — PAROXETINE HCL 20 MG PO TABS
30.0000 mg | ORAL_TABLET | Freq: Every day | ORAL | Status: DC
  Administered 2016-08-24 – 2016-08-26 (×3): 30 mg via ORAL
  Filled 2016-08-24 (×3): qty 2

## 2016-08-24 MED ORDER — OXYCODONE-ACETAMINOPHEN 5-325 MG PO TABS: 1.0000 | ORAL_TABLET | ORAL | Status: DC | PRN

## 2016-08-24 SURGICAL SUPPLY — 25 items
BAG URO CATCHER STRL LF (MISCELLANEOUS) ×3 IMPLANT
BASKET DAKOTA 1.9FR 11X120 (BASKET) IMPLANT
BASKET LASER NITINOL 1.9FR (BASKET) IMPLANT
CATH FOLEY LATEX FREE 20FR (CATHETERS)
CATH FOLEY LF 20FR (CATHETERS) IMPLANT
CATH INTERMIT  6FR 70CM (CATHETERS) ×3 IMPLANT
CLOTH BEACON ORANGE TIMEOUT ST (SAFETY) ×3 IMPLANT
COVER SURGICAL LIGHT HANDLE (MISCELLANEOUS) IMPLANT
EXTRACTOR STONE NITINOL NGAGE (UROLOGICAL SUPPLIES) ×3 IMPLANT
FIBER LASER TRAC TIP (UROLOGICAL SUPPLIES) IMPLANT
GLOVE BIO SURGEON STRL SZ8 (GLOVE) ×3 IMPLANT
GOWN STRL REUS W/TWL XL LVL3 (GOWN DISPOSABLE) ×3 IMPLANT
GUIDEWIRE ANG ZIPWIRE 038X150 (WIRE) ×3 IMPLANT
GUIDEWIRE STR DUAL SENSOR (WIRE) ×3 IMPLANT
IV NS 1000ML (IV SOLUTION) ×2
IV NS 1000ML BAXH (IV SOLUTION) ×1 IMPLANT
MANIFOLD NEPTUNE II (INSTRUMENTS) ×3 IMPLANT
PACK CYSTO (CUSTOM PROCEDURE TRAY) ×3 IMPLANT
SHEATH ACCESS URETERAL 38CM (SHEATH) IMPLANT
STENT CONTOUR 6FRX26X.038 (STENTS) ×3 IMPLANT
SYR CONTROL 10ML LL (SYRINGE) ×3 IMPLANT
SYRINGE IRR TOOMEY STRL 70CC (SYRINGE) IMPLANT
TUBE FEEDING 8FR 16IN STR KANG (MISCELLANEOUS) IMPLANT
TUBING CONNECTING 10 (TUBING) ×2 IMPLANT
TUBING CONNECTING 10' (TUBING) ×1

## 2016-08-24 NOTE — Op Note (Signed)
.  Preoperative diagnosis: right UPJ stone, sepsis  Postoperative diagnosis: Same  Procedure: 1 cystoscopy 2. right retrograde pyelography 3.  Intraoperative fluoroscopy, under one hour, with interpretation 4. right 6 x 26 JJ stent placement 5. Basket extraction of right ureteral calculi  Attending: Wilkie AyePatrick Mckenzie  Anesthesia: General  Estimated blood loss: None  Drains: Right 6 x 26 JJ ureteral stent without tether, 16 French foley catheter  Specimens: stone for analysis  Antibiotics: ARocephin  Findings: numerous right distal ureteral calculi. Moderate hydronephrosis. No masses/lesions in the bladder. Ureteral orifices in normal anatomic location.  Indications: Patient is a 31 year old female/female with a history of right renal stone and concern for sepsis.  After discussing treatment options, they decided proceed with right stent placement.  Procedure her in detail: The patient was brought to the operating room and a brief timeout was done to ensure correct patient, correct procedure, correct site.  General anesthesia was administered patient was placed in dorsal lithotomy position.  Their genitalia was then prepped and draped in usual sterile fashion.  A rigid 22 French cystoscope was passed in the urethra and the bladder.  Bladder was inspected free masses or lesions.  the ureteral orifices were in the normal orthotopic locations. There was a crowning right ureteral calculus. We passed an NGage basket next to the calculus and upon opening the basket numerous stones were expelled from the right ureter.  a 6 french ureteral catheter was then instilled into the right ureteral orifice.  a gentle retrograde was obtained and findings noted above.  we then placed a zip wire through the ureteral catheter and advanced up to the renal pelvis.    We then placed a 6 x 26 double-j ureteral stent over the original zip wire.  We then removed the wire and good coil was noted in the the renal pelvis  under fluoroscopy and the bladder under direct vision.  A foley catheter was then placed. the bladder was then drained and this concluded the procedure which was well tolerated by patient.  Complications: None  Condition: Stable, extubated, transferred to PACU  Plan: Patient is to be admitted for IV antibiotics. She will have his stone extraction in 2 weeks.

## 2016-08-24 NOTE — Anesthesia Procedure Notes (Addendum)
Procedure Name: Intubation Date/Time: 08/24/2016 6:42 PM Performed by: Anne Fu Pre-anesthesia Checklist: Patient identified, Emergency Drugs available, Suction available, Patient being monitored and Timeout performed Patient Re-evaluated:Patient Re-evaluated prior to inductionOxygen Delivery Method: Circle system utilized Preoxygenation: Pre-oxygenation with 100% oxygen Intubation Type: IV induction, Cricoid Pressure applied and Rapid sequence Laryngoscope Size: Mac and 4 Grade View: Grade I Tube type: Oral Tube size: 7.5 mm Number of attempts: 1 Airway Equipment and Method: Stylet Placement Confirmation: ETT inserted through vocal cords under direct vision,  positive ETCO2 and breath sounds checked- equal and bilateral Tube secured with: Tape Dental Injury: Teeth and Oropharynx as per pre-operative assessment

## 2016-08-24 NOTE — Anesthesia Postprocedure Evaluation (Signed)
Anesthesia Post Note  Patient: Samantha Nguyen  Procedure(s) Performed: Procedure(s) (LRB): CYSTOSCOPY WITH RETROGRADE PYELOGRAM/URETERAL STENT PLACEMENT (Right)     Patient location during evaluation: PACU Anesthesia Type: General Level of consciousness: awake and alert Pain management: pain level controlled Vital Signs Assessment: post-procedure vital signs reviewed and stable Respiratory status: spontaneous breathing, nonlabored ventilation, respiratory function stable and patient connected to nasal cannula oxygen Cardiovascular status: blood pressure returned to baseline and stable Postop Assessment: no signs of nausea or vomiting Anesthetic complications: no    Last Vitals:  Vitals:   08/24/16 1741 08/24/16 1926  BP: 126/77 117/70  Pulse: 83 (!) 113  Resp: 20 16  Temp: 37 C 36.8 C    Last Pain:  Vitals:   08/24/16 1741  TempSrc: Oral  PainSc: 2                  Miamor Ayler DAVID

## 2016-08-24 NOTE — H&P (Signed)
Urology Admission H&P  Chief Complaint: rigth flank pain  History of Present Illness: [name] is a 31yo with a hx of nephrolithiasis who presented to my office today with a 1 day history of severe, sharp, constant, nonradiating right flank pain with associated nausea and vomiting. She had a fever of 102 this afternoon. She has had numerous stone events. No other associated symptoms. No exacerbating/alleviaiting events. CT scan showed a 1cm right UVJ calculus with moderate hydronephrosis  Past Medical History:  Diagnosis Date  . Anxiety   . Depressed   . History of kidney stones   . Migraine    Past Surgical History:  Procedure Laterality Date  . CESAREAN SECTION    . MANDIBLE SURGERY      Home Medications:  Prescriptions Prior to Admission  Medication Sig Dispense Refill Last Dose  . ibuprofen (ADVIL,MOTRIN) 200 MG tablet Take 200 mg by mouth every 6 (six) hours as needed for fever or mild pain.   08/24/2016 at 1200  . nitrofurantoin, macrocrystal-monohydrate, (MACROBID) 100 MG capsule Take 1 capsule (100 mg total) by mouth 2 (two) times daily. 14 capsule 0 08/24/2016 at 1100  . PARoxetine (PAXIL) 30 MG tablet Take 30 mg by mouth daily.   08/23/2016 at 0930  . traMADol (ULTRAM) 50 MG tablet Take 50 mg by mouth every 6 (six) hours as needed for moderate pain.   08/24/2016 at 0960  . benzonatate (TESSALON) 100 MG capsule Take 1 capsule (100 mg total) by mouth every 8 (eight) hours. 21 capsule 0   . clindamycin (CLEOCIN) 150 MG capsule Take 1 capsule (150 mg total) by mouth 4 (four) times daily. 28 capsule 0   . fluticasone (FLONASE) 50 MCG/ACT nasal spray Place 2 sprays into both nostrils daily. For at least 2 weeks 16 g 2 More than a month at Unknown time  . ipratropium (ATROVENT) 0.06 % nasal spray Place 2 sprays into both nostrils 4 (four) times daily. 15 mL 1 More than a month at Unknown time   Allergies:  Allergies  Allergen Reactions  . Sulfa Antibiotics Other (See Comments)     Unknown, happened when she was a child    History reviewed. No pertinent family history. Social History:  reports that she has quit smoking. She has never used smokeless tobacco. She reports that she drinks alcohol. She reports that she does not use drugs.  Review of Systems  Constitutional: Positive for chills and fever.  Gastrointestinal: Positive for nausea and vomiting.  Genitourinary: Positive for dysuria, flank pain, frequency and urgency.  Neurological: Positive for weakness.  All other systems reviewed and are negative.   Physical Exam:  Vital signs in last 24 hours: Temp:  [98.6 F (37 C)] 98.6 F (37 C) (06/12 1741) Pulse Rate:  [83] 83 (06/12 1741) Resp:  [20] 20 (06/12 1741) BP: (126)/(77) 126/77 (06/12 1741) SpO2:  [100 %] 100 % (06/12 1741) Weight:  [79.4 kg (175 lb)] 79.4 kg (175 lb) (06/12 1741) Physical Exam  Constitutional: She is oriented to person, place, and time. She appears well-developed and well-nourished.  HENT:  Head: Normocephalic and atraumatic.  Eyes: EOM are normal. Pupils are equal, round, and reactive to light.  Neck: Normal range of motion. No thyromegaly present.  Cardiovascular: Normal rate and regular rhythm.   Respiratory: Effort normal. No respiratory distress.  GI: Soft. She exhibits no distension.  Musculoskeletal: Normal range of motion. She exhibits no edema.  Neurological: She is alert and oriented to person, place,  and time.  Skin: Skin is warm and dry.  Psychiatric: She has a normal mood and affect. Her behavior is normal. Judgment and thought content normal.    Laboratory Data:  No results found for this or any previous visit (from the past 24 hour(s)). No results found for this or any previous visit (from the past 240 hour(s)). Creatinine: No results for input(s): CREATININE in the last 168 hours. Baseline Creatinine: unknown  Impression/Assessment:  31yo with right ureteral calculus, sepsis from a urinary source  Plan:   1. The patient will be taken urgently to the OR for right ureteral stent placement.  She will be admitted for IV antibiotics after her surgery  Wilkie Ayeatrick Marrissa Dai 08/24/2016, 6:04 PM

## 2016-08-24 NOTE — Anesthesia Preprocedure Evaluation (Signed)
Anesthesia Evaluation  Patient identified by MRN, date of birth, ID band Patient awake    Reviewed: Allergy & Precautions, NPO status , Patient's Chart, lab work & pertinent test results  Airway Mallampati: I  TM Distance: >3 FB Neck ROM: Full    Dental   Pulmonary former smoker,    Pulmonary exam normal        Cardiovascular Normal cardiovascular exam     Neuro/Psych Anxiety Depression    GI/Hepatic   Endo/Other    Renal/GU      Musculoskeletal   Abdominal   Peds  Hematology   Anesthesia Other Findings   Reproductive/Obstetrics                             Anesthesia Physical Anesthesia Plan  ASA: II and emergent  Anesthesia Plan: General   Post-op Pain Management:    Induction: Intravenous, Cricoid pressure planned and Rapid sequence  PONV Risk Score and Plan: 3 and Ondansetron, Dexamethasone, Propofol and Midazolam  Airway Management Planned: Oral ETT  Additional Equipment:   Intra-op Plan:   Post-operative Plan: Extubation in OR  Informed Consent: I have reviewed the patients History and Physical, chart, labs and discussed the procedure including the risks, benefits and alternatives for the proposed anesthesia with the patient or authorized representative who has indicated his/her understanding and acceptance.     Plan Discussed with: CRNA and Surgeon  Anesthesia Plan Comments:         Anesthesia Quick Evaluation

## 2016-08-24 NOTE — Transfer of Care (Signed)
Immediate Anesthesia Transfer of Care Note  Patient: Samantha Nguyen  Procedure(s) Performed: Procedure(s): CYSTOSCOPY WITH RETROGRADE PYELOGRAM/URETERAL STENT PLACEMENT (Right)  Patient Location: PACU  Anesthesia Type:General  Level of Consciousness:  sedated, patient cooperative and responds to stimulation  Airway & Oxygen Therapy:Patient Spontanous Breathing and Patient connected to face mask oxgen  Post-op Assessment:  Report given to PACU RN and Post -op Vital signs reviewed and stable  Post vital signs:  Reviewed and stable  Last Vitals:  Vitals:   08/24/16 1741 08/24/16 1926  BP: 126/77 117/70  Pulse: 83 (!) 113  Resp: 20 16  Temp: 37 C 36.8 C    Complications: No apparent anesthesia complications

## 2016-08-25 ENCOUNTER — Encounter (HOSPITAL_COMMUNITY): Payer: Self-pay | Admitting: Urology

## 2016-08-25 LAB — CBC
HEMATOCRIT: 34.5 % — AB (ref 36.0–46.0)
Hemoglobin: 11.5 g/dL — ABNORMAL LOW (ref 12.0–15.0)
MCH: 28.5 pg (ref 26.0–34.0)
MCHC: 33.3 g/dL (ref 30.0–36.0)
MCV: 85.6 fL (ref 78.0–100.0)
PLATELETS: 290 10*3/uL (ref 150–400)
RBC: 4.03 MIL/uL (ref 3.87–5.11)
RDW: 12.7 % (ref 11.5–15.5)
WBC: 18.4 10*3/uL — ABNORMAL HIGH (ref 4.0–10.5)

## 2016-08-25 LAB — BASIC METABOLIC PANEL
Anion gap: 9 (ref 5–15)
BUN: 12 mg/dL (ref 6–20)
CHLORIDE: 108 mmol/L (ref 101–111)
CO2: 23 mmol/L (ref 22–32)
CREATININE: 0.73 mg/dL (ref 0.44–1.00)
Calcium: 8.8 mg/dL — ABNORMAL LOW (ref 8.9–10.3)
GFR calc Af Amer: >60 mL/min (ref >60)
GFR calc non Af Amer: >60 mL/min (ref >60)
GLUCOSE: 145 mg/dL — AB (ref 65–99)
POTASSIUM: 3.6 mmol/L (ref 3.5–5.1)
Sodium: 140 mmol/L (ref 135–145)

## 2016-08-25 MED ORDER — DEXTROSE 5 % IV SOLN
2.0000 g | INTRAVENOUS | Status: DC
  Administered 2016-08-25: 2 g via INTRAVENOUS
  Filled 2016-08-25: qty 2

## 2016-08-25 NOTE — Progress Notes (Signed)
1 Day Post-Op Subjective: Patient reports improvement in right flank pain. No fevers overnight  Objective: Vital signs in last 24 hours: Temp:  [97.8 F (36.6 C)-98.6 F (37 C)] 97.8 F (36.6 C) (06/13 0449) Pulse Rate:  [83-115] 106 (06/13 0449) Resp:  [12-20] 15 (06/13 0449) BP: (117-137)/(68-91) 122/68 (06/13 0449) SpO2:  [99 %-100 %] 100 % (06/13 0449) Weight:  [79.4 kg (175 lb)] 79.4 kg (175 lb) (06/12 1741)  Intake/Output from previous day: 06/12 0701 - 06/13 0700 In: 1476.7 [I.V.:1426.7; IV Piggyback:50] Out: 1255 [Urine:1250; Blood:5] Intake/Output this shift: Total I/O In: 1040 [P.O.:240; I.V.:800] Out: 800 [Urine:800]  Physical Exam:  General:alert, cooperative and appears stated age GI: soft, non tender, normal bowel sounds, no palpable masses, no organomegaly, no inguinal hernia Female genitalia: not done Extremities: extremities normal, atraumatic, no cyanosis or edema  Lab Results:  Recent Labs  08/24/16 1940 08/25/16 0721  HGB 11.9* 11.5*  HCT 35.9* 34.5*   BMET  Recent Labs  08/24/16 1940 08/25/16 0721  NA 137 140  K 3.4* 3.6  CL 102 108  CO2 23 23  GLUCOSE 153* 145*  BUN 12 12  CREATININE 0.99 0.73  CALCIUM 8.8* 8.8*   No results for input(s): LABPT, INR in the last 72 hours. No results for input(s): LABURIN in the last 72 hours. Results for orders placed or performed during the hospital encounter of 12/31/15  Culture, group A strep     Status: None   Collection Time: 12/31/15  5:55 PM  Result Value Ref Range Status   Specimen Description THROAT  Final   Special Requests NONE  Final   Culture NO GROUP A STREP (S.PYOGENES) ISOLATED  Final   Report Status 01/03/2016 FINAL  Final    Studies/Results: Dg C-arm 1-60 Min-no Report  Result Date: 08/24/2016 Fluoroscopy was utilized by the requesting physician.  No radiographic interpretation.    Assessment/Plan: POD#1 for right ureteral stent placement for sepsis from a urinary source,  right ureteral calculi  1. D/c foley 2. Continue rocephin pending urine culture 3. Likely discharge tomorrow   LOS: 1 day   Wilkie Ayeatrick Lauretta Sallas 08/25/2016, 4:10 PM

## 2016-08-26 MED ORDER — OXYCODONE-ACETAMINOPHEN 5-325 MG PO TABS: 1.0000 | ORAL_TABLET | ORAL | 0 refills | Status: DC | PRN

## 2016-08-26 MED ORDER — CIPROFLOXACIN HCL 500 MG PO TABS: 500.0000 mg | ORAL_TABLET | Freq: Two times a day (BID) | ORAL | 0 refills | Status: AC

## 2016-08-26 NOTE — Discharge Instructions (Signed)

## 2016-08-26 NOTE — Care Management Note (Signed)
Case Management Note  Patient Details  Name: Samantha Nguyen MRN: [id number] Date of Birth: 06/11/1985  Subjective/Objective: 31 y/o f admitted w/Sepsis. From home. No CM needs.                   Action/Plan:d/c home.   Expected Discharge Date:  08/26/16               Expected Discharge Plan:  Home/Self Care  In-House Referral:     Discharge planning Services  CM Consult  Post Acute Care Choice:    Choice offered to:     DME Arranged:    DME Agency:     HH Arranged:    HH Agency:     Status of Service:  Completed, signed off  If discussed at [employer] Length of Stay Meetings, dates discussed:    Additional Comments:  Lanier ClamMahabir, Delonta Yohannes, RN 08/26/2016, 9:54 AM

## 2016-08-26 NOTE — Discharge Summary (Signed)
Physician Discharge Summary  Patient ID: Samantha Nguyen MRN: [id number] DOB/AGE: [birth year] 31 y.o.  Admit date: 08/24/2016 Discharge date: 08/26/2016  Admission Diagnoses: Sepsis due to urinary source  Discharge Diagnoses:  Active Problems:   Sepsis due to urinary tract infection [facility])   Discharged Condition: good  Hospital Course: The patient tolerated the procedure well and was transferred to the floor on IV pain meds, IV fluid. On POD#1 foley was removed, pt was started on regular diet and they ambulated in the halls. On POD#2, IVFs were discontinued, and the patient passed flatus. Prior to discharge the pt was tolerating a regular diet, pain was controlled on PO pain meds, they were ambulating without difficulty, and they had normal bowel function. Urine culture grew pan sensitive E coli and the patient was discharged on cipro   Consults: None  Significant Diagnostic Studies: labs: urine culture  Treatments: surgery: right ureteral stent placement  Discharge Exam: Blood pressure (!) 143/90, pulse 99, temperature 99 F (37.2 C), temperature source Oral, resp. rate 16, height 5\' 7"  (1.702 m), weight 79.4 kg (175 lb), last menstrual period 08/02/2016, SpO2 100 %. General appearance: alert, cooperative and appears stated age Head: Normocephalic, without obvious abnormality, atraumatic Neck: no adenopathy, no carotid bruit, no JVD, supple, symmetrical, trachea midline and thyroid not enlarged, symmetric, no tenderness/mass/nodules Resp: clear to auscultation bilaterally Cardio: regular rate and rhythm, S1, S2 normal, no murmur, click, rub or gallop GI: soft, non-tender; bowel sounds normal; no masses,  no organomegaly Extremities: extremities normal, atraumatic, no cyanosis or edema Neurologic: Grossly normal  Disposition: 01-Home or Self Care  Discharge Instructions    Discharge patient    Complete by:  As directed    Discharge disposition:  01-Home or Self Care   Discharge  patient date:  08/26/2016     Allergies as of 08/26/2016      Reactions   Sulfa Antibiotics Other (See Comments)   Unknown, happened when she was a child      Medication List    STOP taking these medications   nitrofurantoin (macrocrystal-monohydrate) 100 MG capsule Commonly known as:  MACROBID     TAKE these medications   acetaminophen 500 MG tablet Commonly known as:  TYLENOL Take 500 mg by mouth every 6 (six) hours as needed for fever.   cetirizine 10 MG tablet Commonly known as:  ZYRTEC Take 10 mg by mouth daily.   ciprofloxacin 500 MG tablet Commonly known as:  CIPRO Take 1 tablet (500 mg total) by mouth 2 (two) times daily.   Cranberry 425 MG Caps Take 1 capsule by mouth daily.   ibuprofen 200 MG tablet Commonly known as:  ADVIL,MOTRIN Take 200 mg by mouth every 6 (six) hours as needed for fever or mild pain.   MULTIVITAMIN ADULT Tabs Take 1 tablet by mouth daily.   oxyCODONE-acetaminophen 5-325 MG tablet Commonly known as:  PERCOCET/ROXICET Take 1-2 tablets by mouth every 4 (four) hours as needed for moderate pain.   PARoxetine 30 MG tablet Commonly known as:  PAXIL Take 30 mg by mouth daily.   tamsulosin 0.4 MG Caps capsule Commonly known as:  FLOMAX Take 0.4 mg by mouth daily after supper.   traMADol 50 MG tablet Commonly known as:  ULTRAM Take 50 mg by mouth every 6 (six) hours as needed for moderate pain.   Vitamin D3 5000 units Tabs Take 1 tablet by mouth daily.      Follow-up Information    Viraj Liby, Mardene Celeste, MD. Call  in 2 week(s).   Specialty:  Urology Why:  My office will call to schedule surgery Contact information: [address] [name] [address] [phone]           Signed: Wilkie Aye 08/26/2016, 12:08 PM

## 2016-08-27 ENCOUNTER — Other Ambulatory Visit: Payer: Self-pay | Admitting: Urology

## 2016-08-30 ENCOUNTER — Encounter (HOSPITAL_BASED_OUTPATIENT_CLINIC_OR_DEPARTMENT_OTHER): Payer: Self-pay | Admitting: *Deleted

## 2016-08-30 NOTE — Progress Notes (Signed)
[facility]-Hg,urine pregnancy on arrival-Npo solids after Mn-clear liquids(no dairy,pulp) until 0700,then Npo.Pain medication as need,zyrtec,paxil in am.

## 2016-09-09 ENCOUNTER — Ambulatory Visit (HOSPITAL_BASED_OUTPATIENT_CLINIC_OR_DEPARTMENT_OTHER): Payer: Medicaid Other | Admitting: Anesthesiology

## 2016-09-09 ENCOUNTER — Ambulatory Visit (HOSPITAL_BASED_OUTPATIENT_CLINIC_OR_DEPARTMENT_OTHER)
Admission: RE | Admit: 2016-09-09 | Discharge: 2016-09-09 | Disposition: A | Discharge status: Home / Self Care | Payer: Medicaid Other | Source: Ambulatory Visit | Attending: Urology | Admitting: Urology

## 2016-09-09 ENCOUNTER — Encounter (HOSPITAL_BASED_OUTPATIENT_CLINIC_OR_DEPARTMENT_OTHER): Payer: Self-pay

## 2016-09-09 ENCOUNTER — Encounter (HOSPITAL_BASED_OUTPATIENT_CLINIC_OR_DEPARTMENT_OTHER)
Admission: RE | Disposition: A | Discharge status: Home / Self Care | Payer: Self-pay | Source: Ambulatory Visit | Attending: Urology

## 2016-09-09 DIAGNOSIS — Z79899 Other long term (current) drug therapy: Secondary | ICD-10-CM | POA: Insufficient documentation

## 2016-09-09 DIAGNOSIS — Z87891 Personal history of nicotine dependence: Secondary | ICD-10-CM | POA: Diagnosis not present

## 2016-09-09 DIAGNOSIS — N132 Hydronephrosis with renal and ureteral calculous obstruction: Secondary | ICD-10-CM | POA: Diagnosis present

## 2016-09-09 DIAGNOSIS — F419 Anxiety disorder, unspecified: Secondary | ICD-10-CM | POA: Insufficient documentation

## 2016-09-09 DIAGNOSIS — F329 Major depressive disorder, single episode, unspecified: Secondary | ICD-10-CM | POA: Insufficient documentation

## 2016-09-09 DIAGNOSIS — Z87442 Personal history of urinary calculi: Secondary | ICD-10-CM | POA: Insufficient documentation

## 2016-09-09 HISTORY — PX: CYSTOSCOPY WITH RETROGRADE PYELOGRAM, URETEROSCOPY AND STENT PLACEMENT: SHX5789

## 2016-09-09 LAB — POCT PREGNANCY, URINE: Preg Test, Ur: NEGATIVE

## 2016-09-09 LAB — POCT HEMOGLOBIN-HEMACUE: Hemoglobin: 13.6 g/dL (ref 12.0–15.0)

## 2016-09-09 SURGERY — CYSTOURETEROSCOPY, WITH RETROGRADE PYELOGRAM AND STENT INSERTION
Anesthesia: General | Site: Ureter | Laterality: Right

## 2016-09-09 MED ORDER — KETOROLAC TROMETHAMINE 30 MG/ML IJ SOLN
INTRAMUSCULAR | Status: AC
  Filled 2016-09-09: qty 1

## 2016-09-09 MED ORDER — OXYCODONE-ACETAMINOPHEN 5-325 MG PO TABS: 1.0000 | ORAL_TABLET | ORAL | 0 refills | Status: DC | PRN

## 2016-09-09 MED ORDER — SODIUM CHLORIDE 0.9 % IR SOLN
Status: DC | PRN
  Administered 2016-09-09: 4000 mL

## 2016-09-09 MED ORDER — DEXAMETHASONE SODIUM PHOSPHATE 10 MG/ML IJ SOLN
INTRAMUSCULAR | Status: AC
  Filled 2016-09-09: qty 1

## 2016-09-09 MED ORDER — MIDAZOLAM HCL 5 MG/5ML IJ SOLN
INTRAMUSCULAR | Status: DC | PRN
  Administered 2016-09-09: 2 mg via INTRAVENOUS
  Administered 2016-09-09 (×2): 1 mg via INTRAVENOUS

## 2016-09-09 MED ORDER — LIDOCAINE HCL (CARDIAC) 20 MG/ML IV SOLN
INTRAVENOUS | Status: DC | PRN
  Administered 2016-09-09: 80 mg via INTRAVENOUS

## 2016-09-09 MED ORDER — SODIUM CHLORIDE 0.9 % IV SOLN
INTRAVENOUS | Status: DC
  Administered 2016-09-09 (×2): via INTRAVENOUS
  Filled 2016-09-09: qty 1000

## 2016-09-09 MED ORDER — PROPOFOL 10 MG/ML IV BOLUS
INTRAVENOUS | Status: DC | PRN
  Administered 2016-09-09: 100 mg via INTRAVENOUS
  Administered 2016-09-09: 200 mg via INTRAVENOUS

## 2016-09-09 MED ORDER — FAMOTIDINE IN NACL 20-0.9 MG/50ML-% IV SOLN
20.0000 mg | Freq: Once | INTRAVENOUS | Status: AC
  Administered 2016-09-09: 20 mg via INTRAVENOUS
  Filled 2016-09-09: qty 50

## 2016-09-09 MED ORDER — IOHEXOL 300 MG/ML  SOLN
INTRAMUSCULAR | Status: DC | PRN
  Administered 2016-09-09: 4 mL

## 2016-09-09 MED ORDER — CEFTRIAXONE SODIUM 2 G IJ SOLR
INTRAMUSCULAR | Status: AC
  Filled 2016-09-09: qty 2

## 2016-09-09 MED ORDER — DEXTROSE 5 % IV SOLN
INTRAVENOUS | Status: AC
  Filled 2016-09-09: qty 50

## 2016-09-09 MED ORDER — LIDOCAINE 2% (20 MG/ML) 5 ML SYRINGE
INTRAMUSCULAR | Status: AC
  Filled 2016-09-09: qty 5

## 2016-09-09 MED ORDER — LACTATED RINGERS IV SOLN
INTRAVENOUS | Status: DC
  Filled 2016-09-09: qty 1000

## 2016-09-09 MED ORDER — FAMOTIDINE IN NACL 20-0.9 MG/50ML-% IV SOLN
INTRAVENOUS | Status: AC
  Filled 2016-09-09: qty 50

## 2016-09-09 MED ORDER — HYDROMORPHONE HCL 1 MG/ML IJ SOLN
0.2500 mg | INTRAMUSCULAR | Status: DC | PRN
  Filled 2016-09-09: qty 0.5

## 2016-09-09 MED ORDER — PROMETHAZINE HCL 25 MG/ML IJ SOLN
6.2500 mg | INTRAMUSCULAR | Status: DC | PRN
  Filled 2016-09-09: qty 1

## 2016-09-09 MED ORDER — PROPOFOL 10 MG/ML IV BOLUS
INTRAVENOUS | Status: AC
  Filled 2016-09-09: qty 40

## 2016-09-09 MED ORDER — ONDANSETRON HCL 4 MG/2ML IJ SOLN
INTRAMUSCULAR | Status: DC | PRN
  Administered 2016-09-09: 4 mg via INTRAVENOUS

## 2016-09-09 MED ORDER — DEXAMETHASONE SODIUM PHOSPHATE 4 MG/ML IJ SOLN
INTRAMUSCULAR | Status: DC | PRN
  Administered 2016-09-09: 10 mg via INTRAVENOUS

## 2016-09-09 MED ORDER — ONDANSETRON HCL 4 MG/2ML IJ SOLN
INTRAMUSCULAR | Status: AC
  Filled 2016-09-09: qty 2

## 2016-09-09 MED ORDER — OXYCODONE HCL 5 MG PO TABS
5.0000 mg | ORAL_TABLET | Freq: Once | ORAL | Status: DC | PRN
  Filled 2016-09-09: qty 1

## 2016-09-09 MED ORDER — MIDAZOLAM HCL 2 MG/2ML IJ SOLN
INTRAMUSCULAR | Status: AC
  Filled 2016-09-09: qty 2

## 2016-09-09 MED ORDER — DEXTROSE 5 % IV SOLN
2.0000 g | INTRAVENOUS | Status: AC
  Administered 2016-09-09: 2 g via INTRAVENOUS
  Filled 2016-09-09: qty 2

## 2016-09-09 MED ORDER — OXYCODONE HCL 5 MG/5ML PO SOLN
5.0000 mg | Freq: Once | ORAL | Status: DC | PRN
  Filled 2016-09-09: qty 5

## 2016-09-09 MED ORDER — KETOROLAC TROMETHAMINE 30 MG/ML IJ SOLN
INTRAMUSCULAR | Status: DC | PRN
  Administered 2016-09-09: 30 mg via INTRAVENOUS

## 2016-09-09 MED ORDER — FENTANYL CITRATE (PF) 100 MCG/2ML IJ SOLN
INTRAMUSCULAR | Status: DC | PRN
  Administered 2016-09-09 (×4): 25 ug via INTRAVENOUS

## 2016-09-09 MED ORDER — CEPHALEXIN 250 MG PO CAPS: 250.0000 mg | ORAL_CAPSULE | Freq: Four times a day (QID) | ORAL | 0 refills | Status: AC

## 2016-09-09 MED ORDER — FENTANYL CITRATE (PF) 100 MCG/2ML IJ SOLN
INTRAMUSCULAR | Status: AC
  Filled 2016-09-09: qty 2

## 2016-09-09 SURGICAL SUPPLY — 29 items
BAG DRAIN URO-CYSTO SKYTR STRL (DRAIN) ×4 IMPLANT
BASKET DAKOTA 1.9FR 11X120 (BASKET) IMPLANT
BASKET LASER NITINOL 1.9FR (BASKET) IMPLANT
BASKET STONE 1.7 NGAGE (UROLOGICAL SUPPLIES) ×4 IMPLANT
BASKET ZERO TIP NITINOL 2.4FR (BASKET) IMPLANT
CATH INTERMIT  6FR 70CM (CATHETERS) ×4 IMPLANT
CLOTH BEACON ORANGE TIMEOUT ST (SAFETY) ×4 IMPLANT
FIBER LASER FLEXIVA 1000 (UROLOGICAL SUPPLIES) IMPLANT
FIBER LASER FLEXIVA 365 (UROLOGICAL SUPPLIES) IMPLANT
GLOVE BIO SURGEON STRL SZ8 (GLOVE) ×4 IMPLANT
GOWN STRL REUS W/ TWL LRG LVL3 (GOWN DISPOSABLE) ×2 IMPLANT
GOWN STRL REUS W/ TWL XL LVL3 (GOWN DISPOSABLE) ×2 IMPLANT
GOWN STRL REUS W/TWL LRG LVL3 (GOWN DISPOSABLE) ×2
GOWN STRL REUS W/TWL XL LVL3 (GOWN DISPOSABLE) ×2
GUIDEWIRE ANG ZIPWIRE 038X150 (WIRE) ×4 IMPLANT
GUIDEWIRE STR DUAL SENSOR (WIRE) IMPLANT
IV NS 1000ML (IV SOLUTION) ×2
IV NS 1000ML BAXH (IV SOLUTION) ×2 IMPLANT
IV NS IRRIG 3000ML ARTHROMATIC (IV SOLUTION) ×4 IMPLANT
KIT RM TURNOVER CYSTO AR (KITS) ×4 IMPLANT
MANIFOLD NEPTUNE II (INSTRUMENTS) IMPLANT
NS IRRIG 500ML POUR BTL (IV SOLUTION) ×4 IMPLANT
PACK CYSTO (CUSTOM PROCEDURE TRAY) ×4 IMPLANT
STENT URET 6FRX24 CONTOUR (STENTS) ×4 IMPLANT
STENT URET 6FRX26 CONTOUR (STENTS) IMPLANT
SYRINGE 10CC LL (SYRINGE) ×4 IMPLANT
TUBE CONNECTING 12'X1/4 (SUCTIONS)
TUBE CONNECTING 12X1/4 (SUCTIONS) IMPLANT
TUBE FEEDING 8FR 16IN STR KANG (MISCELLANEOUS) ×4 IMPLANT

## 2016-09-09 NOTE — Brief Op Note (Signed)
09/09/2016  2:07 PM  PATIENT:  Samantha Nguyen  31 y.o. female  PRE-OPERATIVE DIAGNOSIS:  RIGHT URETERAL CALCULI  POST-OPERATIVE DIAGNOSIS:  Right renal calculi  PROCEDURE:  Procedure(s): CYSTOSCOPY WITH RETROGRADE PYELOGRAM, URETEROSCOPY AND STENT REPLACEMENT (Right) HOLMIUM LASER APPLICATION (Right)  SURGEON:  Surgeon(s) and Role:    * Felissa Blouch, Mardene CelestePatrick L, MD - Primary  PHYSICIAN ASSISTANT:   ASSISTANTS: none   ANESTHESIA:   general  EBL:  Total I/O In: 1000 [I.V.:1000] Out: -   BLOOD ADMINISTERED:none  DRAINS: right 6x24 JJ ureteral stent with tether   LOCAL MEDICATIONS USED:  NONE  SPECIMEN:  Source of Specimen:  right renal calculus  DISPOSITION OF SPECIMEN:  N/A  COUNTS:  YES  TOURNIQUET:  * No tourniquets in log *  DICTATION: .Note written in EPIC  PLAN OF CARE: Discharge to home after PACU  PATIENT DISPOSITION:  PACU - hemodynamically stable.   Delay start of Pharmacological VTE agent (>24hrs) due to surgical blood loss or risk of bleeding: not applicable

## 2016-09-09 NOTE — Transfer of Care (Signed)
Immediate Anesthesia Transfer of Care Note  Patient: Samantha Nguyen  Procedure(s) Performed: Procedure(s) (LRB): CYSTOSCOPY WITH RETROGRADE PYELOGRAM, URETEROSCOPY AND STENT REPLACEMENT (Right)  Patient Location: PACU  Anesthesia Type: General  Level of Consciousness: awake, sedated, patient cooperative and responds to stimulation  Airway & Oxygen Therapy: Patient Spontanous Breathing and Patient connected to [place] O2   Post-op Assessment: Report given to PACU RN, Post -op Vital signs reviewed and stable and Patient moving all extremities  Post vital signs: Reviewed and stable  Complications: No apparent anesthesia complications

## 2016-09-09 NOTE — Anesthesia Postprocedure Evaluation (Signed)
Anesthesia Post Note  Patient: Samantha Nguyen  Procedure(s) Performed: Procedure(s) (LRB): CYSTOSCOPY WITH RETROGRADE PYELOGRAM, URETEROSCOPY AND STENT REPLACEMENT (Right)     Patient location during evaluation: PACU Anesthesia Type: General Level of consciousness: awake and alert Pain management: pain level controlled Vital Signs Assessment: post-procedure vital signs reviewed and stable Respiratory status: spontaneous breathing, nonlabored ventilation, respiratory function stable and patient connected to nasal cannula oxygen Cardiovascular status: blood pressure returned to baseline and stable Postop Assessment: no signs of nausea or vomiting Anesthetic complications: no    Last Vitals:  Vitals:   09/09/16 1418 09/09/16 1522  BP: 103/77 131/89  Pulse: (!) 106 95  Resp: 12 18  Temp: 36.8 C 36.9 C    Last Pain:  Vitals:   09/09/16 1116  TempSrc: Oral                 Beauford Lando P Samantha Nguyen

## 2016-09-09 NOTE — Anesthesia Preprocedure Evaluation (Addendum)
Anesthesia Evaluation  Patient identified by MRN, date of birth, ID band Patient awake    Reviewed: Allergy & Precautions, NPO status , Patient's Chart, lab work & pertinent test results  Airway Mallampati: I  TM Distance: >3 FB Neck ROM: Full    Dental  (+) Dental Advisory Given   Pulmonary former smoker,    Pulmonary exam normal        Cardiovascular negative cardio ROS Normal cardiovascular exam     Neuro/Psych  Headaches, Anxiety Depression    GI/Hepatic negative GI ROS, Neg liver ROS,   Endo/Other  negative endocrine ROS  Renal/GU negative Renal ROS     Musculoskeletal negative musculoskeletal ROS (+)   Abdominal   Peds negative pediatric ROS (+)  Hematology negative hematology ROS (+)   Anesthesia Other Findings Right ear piercing  Maxilla tooth piercing  hcg negative  Reproductive/Obstetrics negative OB ROS                            Anesthesia Physical  Anesthesia Plan  ASA: II  Anesthesia Plan: General   Post-op Pain Management:    Induction: Intravenous  PONV Risk Score and Plan: 4 or greater and Ondansetron, Dexamethasone, Propofol and Midazolam  Airway Management Planned: LMA  Additional Equipment:   Intra-op Plan:   Post-operative Plan:   Informed Consent: I have reviewed the patients History and Physical, chart, labs and discussed the procedure including the risks, benefits and alternatives for the proposed anesthesia with the patient or authorized representative who has indicated his/her understanding and acceptance.     Plan Discussed with: CRNA and Surgeon  Anesthesia Plan Comments:        Anesthesia Quick Evaluation

## 2016-09-09 NOTE — H&P (View-Only) (Signed)
Urology Admission H&P  Chief Complaint: rigth flank pain  History of Present Illness: [name] is a 31yo with a hx of nephrolithiasis who presented to my office today with a 1 day history of severe, sharp, constant, nonradiating right flank pain with associated nausea and vomiting. She had a fever of 102 this afternoon. She has had numerous stone events. No other associated symptoms. No exacerbating/alleviaiting events. CT scan showed a 1cm right UVJ calculus with moderate hydronephrosis  Past Medical History:  Diagnosis Date  . Anxiety   . Depressed   . History of kidney stones   . Migraine    Past Surgical History:  Procedure Laterality Date  . CESAREAN SECTION    . MANDIBLE SURGERY      Home Medications:  Prescriptions Prior to Admission  Medication Sig Dispense Refill Last Dose  . ibuprofen (ADVIL,MOTRIN) 200 MG tablet Take 200 mg by mouth every 6 (six) hours as needed for fever or mild pain.   08/24/2016 at 1200  . nitrofurantoin, macrocrystal-monohydrate, (MACROBID) 100 MG capsule Take 1 capsule (100 mg total) by mouth 2 (two) times daily. 14 capsule 0 08/24/2016 at 1100  . PARoxetine (PAXIL) 30 MG tablet Take 30 mg by mouth daily.   08/23/2016 at 0930  . traMADol (ULTRAM) 50 MG tablet Take 50 mg by mouth every 6 (six) hours as needed for moderate pain.   08/24/2016 at 0960  . benzonatate (TESSALON) 100 MG capsule Take 1 capsule (100 mg total) by mouth every 8 (eight) hours. 21 capsule 0   . clindamycin (CLEOCIN) 150 MG capsule Take 1 capsule (150 mg total) by mouth 4 (four) times daily. 28 capsule 0   . fluticasone (FLONASE) 50 MCG/ACT nasal spray Place 2 sprays into both nostrils daily. For at least 2 weeks 16 g 2 More than a month at Unknown time  . ipratropium (ATROVENT) 0.06 % nasal spray Place 2 sprays into both nostrils 4 (four) times daily. 15 mL 1 More than a month at Unknown time   Allergies:  Allergies  Allergen Reactions  . Sulfa Antibiotics Other (See Comments)     Unknown, happened when she was a child    History reviewed. No pertinent family history. Social History:  reports that she has quit smoking. She has never used smokeless tobacco. She reports that she drinks alcohol. She reports that she does not use drugs.  Review of Systems  Constitutional: Positive for chills and fever.  Gastrointestinal: Positive for nausea and vomiting.  Genitourinary: Positive for dysuria, flank pain, frequency and urgency.  Neurological: Positive for weakness.  All other systems reviewed and are negative.   Physical Exam:  Vital signs in last 24 hours: Temp:  [98.6 F (37 C)] 98.6 F (37 C) (06/12 1741) Pulse Rate:  [83] 83 (06/12 1741) Resp:  [20] 20 (06/12 1741) BP: (126)/(77) 126/77 (06/12 1741) SpO2:  [100 %] 100 % (06/12 1741) Weight:  [79.4 kg (175 lb)] 79.4 kg (175 lb) (06/12 1741) Physical Exam  Constitutional: She is oriented to person, place, and time. She appears well-developed and well-nourished.  HENT:  Head: Normocephalic and atraumatic.  Eyes: EOM are normal. Pupils are equal, round, and reactive to light.  Neck: Normal range of motion. No thyromegaly present.  Cardiovascular: Normal rate and regular rhythm.   Respiratory: Effort normal. No respiratory distress.  GI: Soft. She exhibits no distension.  Musculoskeletal: Normal range of motion. She exhibits no edema.  Neurological: She is alert and oriented to person, place,  and time.  Skin: Skin is warm and dry.  Psychiatric: She has a normal mood and affect. Her behavior is normal. Judgment and thought content normal.    Laboratory Data:  No results found for this or any previous visit (from the past 24 hour(s)). No results found for this or any previous visit (from the past 240 hour(s)). Creatinine: No results for input(s): CREATININE in the last 168 hours. Baseline Creatinine: unknown  Impression/Assessment:  31yo with right ureteral calculus, sepsis from a urinary source  Plan:   1. The patient will be taken urgently to the OR for right ureteral stent placement.  She will be admitted for IV antibiotics after her surgery  Samantha Nguyen 08/24/2016, 6:04 PM

## 2016-09-09 NOTE — Discharge Instructions (Signed)
Ureteral Stent Implantation, Care After Refer to this sheet in the next few weeks. These instructions provide you with information about caring for yourself after your procedure. Your health care provider may also give you more specific instructions. Your treatment has been planned according to current medical practices, but problems sometimes occur. Call your health care provider if you have any problems or questions after your procedure. What can I expect after the procedure? After the procedure, it is common to have:  Nausea.  Mild pain when you urinate. You may feel this pain in your lower back or lower abdomen. Pain should stop within a few minutes after you urinate. This may last for up to 1 week.  A small amount of blood in your urine for several days.  Follow these instructions at home:  Medicines  Take over-the-counter and prescription medicines only as told by your health care provider.  If you were prescribed an antibiotic medicine, take it as told by your health care provider. Do not stop taking the antibiotic even if you start to feel better.  Do not drive for 24 hours if you received a sedative.  Do not drive or operate heavy machinery while taking prescription pain medicines. Activity  Return to your normal activities as told by your health care provider. Ask your health care provider what activities are safe for you.  Do not lift anything that is heavier than 10 lb (4.5 kg). Follow this limit for 1 week after your procedure, or for as long as told by your health care provider. General instructions  Watch for any blood in your urine. Call your health care provider if the amount of blood in your urine increases.  If you have a catheter: ? Follow instructions from your health care provider about taking care of your catheter and collection bag. ? Do not take baths, swim, or use a hot tub until your health care provider approves.  Drink enough fluid to keep your urine  clear or pale yellow.  Keep all follow-up visits as told by your health care provider. This is important. Contact a health care provider if:  You have pain that gets worse or does not get better with medicine, especially pain when you urinate.  You have difficulty urinating.  You feel nauseous or you vomit repeatedly during a period of more than 2 days after the procedure. Get help right away if:  Your urine is dark red or has blood clots in it.  You are leaking urine (have incontinence).  The end of the stent comes out of your urethra.  You cannot urinate.  You have sudden, sharp, or severe pain in your abdomen or lower back.  You have a fever. This information is not intended to replace advice given to you by your health care provider. Make sure you discuss any questions you have with your health care provider. Document Released: 11/01/2012 Document Revised: 08/07/2015 Document Reviewed: 09/13/2014 Elsevier Interactive Patient Education  2018 [employer]  REMOVE STENT IN 48 HOURS BY PULLING THE STRING    Post Anesthesia Home Care Instructions  Activity: Get plenty of rest for the remainder of the day. A responsible individual must stay with you for 24 hours following the procedure.  For the next 24 hours, DO NOT: -Drive a car -Advertising copywriterperate machinery -Drink alcoholic beverages -Take any medication unless instructed by your physician -Make any legal decisions or sign important papers.  Meals: Start with liquid foods such as gelatin or soup. Progress to  regular foods as tolerated. Avoid greasy, spicy, heavy foods. If nausea and/or vomiting occur, drink only clear liquids until the nausea and/or vomiting subsides. Call your physician if vomiting continues.  Special Instructions/Symptoms: Your throat may feel dry or sore from the anesthesia or the breathing tube placed in your throat during surgery. If this causes discomfort, gargle with warm salt water. The discomfort should  disappear within 24 hours.  If you had a scopolamine patch placed behind your ear for the management of post- operative nausea and/or vomiting:  1. The medication in the patch is effective for 72 hours, after which it should be removed.  Wrap patch in a tissue and discard in the trash. Wash hands thoroughly with soap and water. 2. You may remove the patch earlier than 72 hours if you experience unpleasant side effects which may include dry mouth, dizziness or visual disturbances. 3. Avoid touching the patch. Wash your hands with soap and water after contact with the patch.

## 2016-09-09 NOTE — Interval H&P Note (Signed)
History and Physical Interval Note:  09/09/2016 1:22 PM  Samantha Nguyen  has presented today for surgery, with the diagnosis of RIGHT URETERAL CALCULI  The various methods of treatment have been discussed with the patient and family. After consideration of risks, benefits and other options for treatment, the patient has consented to  Procedure(s): CYSTOSCOPY WITH RETROGRADE PYELOGRAM, URETEROSCOPY AND STENT REPLACEMENT (Right) HOLMIUM LASER APPLICATION (Right) as a surgical intervention .  The patient's history has been reviewed, patient examined, no change in status, stable for surgery.  I have reviewed the patient's chart and labs.  Questions were answered to the patient's satisfaction.     Wilkie AyePatrick Chiquitta Matty

## 2016-09-09 NOTE — Anesthesia Procedure Notes (Signed)
Procedure Name: LMA Insertion Date/Time: 09/09/2016 1:36 PM Performed by: Jessica PriestBEESON, Samantha Nguyen Pre-anesthesia Checklist: Patient identified, Emergency Drugs available, Suction available and Patient being monitored Patient Re-evaluated:Patient Re-evaluated prior to inductionOxygen Delivery Method: Circle system utilized Preoxygenation: Pre-oxygenation with 100% oxygen Intubation Type: IV induction Ventilation: Mask ventilation without difficulty LMA: LMA inserted LMA Size: 4.0 Number of attempts: 1 Airway Equipment and Method: Bite block Placement Confirmation: positive ETCO2 and breath sounds checked- equal and bilateral Tube secured with: Tape Dental Injury: Teeth and Oropharynx as per pre-operative assessment

## 2016-09-10 ENCOUNTER — Encounter (HOSPITAL_BASED_OUTPATIENT_CLINIC_OR_DEPARTMENT_OTHER): Payer: Self-pay | Admitting: Urology

## 2016-09-10 NOTE — Op Note (Addendum)
Preoperative diagnosis: Right ureteral calculi  Postoperative diagnosis: Same  Procedure: 1 cystoscopy 2 right retrograde pyelography 3.  Intraoperative fluoroscopy, under one hour, with interpretation 4.  Right ureteroscopic stone manipulation with basket extraction 5.  Right 6 x 26 JJ stent exchange  Attending: Cleda MccreedyPatrick Mackenzie  Anesthesia: General  Estimated blood loss: None  Drains: Right 6 x 24 JJ ureteral stent with tether  Specimens: stone for analysis  Antibiotics: rocephin  Findings: 3mm distal ureteral calculus and 3mm mid pole calculus. No hydronephrosis.  Indications: Patient is a 31 year old female with a history of ureteral stone and who has failed medical expulsive therapy. She is status post right ureteral stent placement for UTI and obstructing stone. After discussing treatment options, she decided proceed with right ureteroscopic stone manipulation.  Procedure her in detail: The patient was brought to the operating room and a brief timeout was done to ensure correct patient, correct procedure, correct site.  General anesthesia was administered patient was placed in dorsal lithotomy position.  Her genitalia was then prepped and draped in usual sterile fashion.  A rigid 22 French cystoscope was passed in the urethra and the bladder.  Bladder was inspected free masses or lesions.  the ureteral orifices were in the normal orthotopic locations. Using a grasper the stent was brought to the urethral meatus. A zipwire was then advanced through the stent and up to the renal pelvis. The stent was then removed. a 6 french ureteral catheter was then instilled into the right ureter orifice.  a gentle retrograde was obtained and findings noted above.   we then removed the cystoscope and cannulated the right ureteral orifice with a semirigid ureteroscope.  we then encountered the stone in the mid ureter.  Using a basket the stone was removed. Repeat ureteroscopy revealed no  stone in the ureter. We then advanced a sensor wire into the renal pelvis. The scope was removed and we then advanced a 12/14 x 38cm access sheath up to the renal pelvis. We then used the flexible ureteroscope to perform nephroscopy. We identified a calculus in the mid pole and removed it with an NGage basket. We then removed the access sheath under direct vision and noted to injury to the ureter.   we then placed a 6 x 24 double-j ureteral stent over the original zip wire. We then removed the wire and good coil was noted in the the renal pelvis under fluoroscopy and the bladder under direct vision.     the stone fragments were then removed from the bladder and sent for analysis.   the bladder was then drained and this concluded the procedure which was well tolerated by patient.  Complications: None  Condition: Stable, extubated, transferred to PACU  Plan: Patient is to be discharged home as to follow-up in one week. She is to remove her stent by pulling the tether in 72 hours

## 2016-10-18 ENCOUNTER — Other Ambulatory Visit
Admission: RE | Admit: 2016-10-18 | Discharge: 2016-10-18 | Disposition: A | Discharge status: Home / Self Care | Payer: Medicaid (Managed Care) | Source: Ambulatory Visit | Attending: Internal Medicine | Admitting: Internal Medicine

## 2016-10-18 ENCOUNTER — Telehealth: Payer: Self-pay

## 2016-10-18 DIAGNOSIS — Z0000 Encounter for general adult medical examination without abnormal findings: Secondary | ICD-10-CM

## 2016-10-18 LAB — LIPID PANEL
Chol/HDL Ratio: 2.9
Cholesterol: 125 mg/dL
HDL: 43 mg/dL
LDL Calculated: 35 mg/dL
Non HDL Cholesterol: 82 mg/dL
Triglycerides: 235 mg/dL — AB

## 2016-10-18 LAB — BASIC METABOLIC PANEL
Anion Gap: 12 (ref 7–16)
CO2: 25 mmol/L (ref 20–28)
Calcium: 9.2 mg/dL (ref 8.8–10.2)
Chloride: 104 mmol/L (ref 96–108)
Creatinine: 0.95 mg/dL (ref 0.51–0.95)
GFR,Black: 92 *
GFR,Caucasian: 80 *
Glucose: 96 mg/dL (ref 60–99)
Lab: 15 mg/dL (ref 6–20)
Potassium: 4.5 mmol/L (ref 3.3–5.1)
Sodium: 141 mmol/L (ref 133–145)

## 2016-10-18 LAB — MULTIPLE ORDERING DOCS

## 2016-10-18 MED ORDER — ACETAMINOPHEN 325 MG PO TABS *I*
650.0000 mg | ORAL_TABLET | Freq: Four times a day (QID) | ORAL | 1 refills | Status: DC | PRN
Start: 2016-10-18 — End: 2016-10-18

## 2016-10-18 MED ORDER — PHENAZOPYRIDINE HCL 100 MG PO TABS *I*
100.0000 mg | ORAL_TABLET | Freq: Three times a day (TID) | ORAL | 0 refills | Status: AC | PRN
Start: 2016-10-18 — End: 2016-10-21

## 2016-10-18 MED ORDER — ACETAMINOPHEN 325 MG PO TABS *I*
650.0000 mg | ORAL_TABLET | Freq: Four times a day (QID) | ORAL | 1 refills | Status: AC | PRN
Start: 2016-10-18 — End: 2016-11-17

## 2016-10-18 MED ORDER — PHENAZOPYRIDINE HCL 100 MG PO TABS *I*
100.0000 mg | ORAL_TABLET | Freq: Three times a day (TID) | ORAL | 0 refills | Status: DC | PRN
Start: 2016-10-18 — End: 2016-10-18

## 2016-10-18 NOTE — Telephone Encounter (Signed)
Updated pharmacy. [facility] [address]

## 2016-10-18 NOTE — Telephone Encounter (Signed)
Returned call to patient. Patient complaining of UTI symptoms. Patient instructed to go to a [facility] urgent [facility] center for a urine culture and evaluation as there are no available appointments today or tomorrow at this time. Patient states understanding of all. Patient is also asking for a refill of her tylenol Rx. Will forward to C. Nelida MeuseScheiber, NP to refill.    Frequency:no  Pain with urination:yes  Fever:no  Back pain:yes  Hx of interstitial cystitis, SCD or SCT, pyleonephritis:yes per patient

## 2016-10-18 NOTE — Telephone Encounter (Signed)
Patient stated she needs an urgent appointment for either today or tomorrow, writer advised we're booked & she requested to speak with the Nurse.      FYI: She declined to state what she needed to be seen for, she can be reached @ [phone].

## 2016-10-18 NOTE — Telephone Encounter (Signed)
Call to pt,reviewed C.Scheiber's message.  Pt will f/u with the Pharmacy today.  Agreeable to monitor sx's,seek urgent care as indicated.  Compliance reinforced,pt voiced understanding.

## 2016-10-18 NOTE — Telephone Encounter (Signed)
Agree with nursing plan for urine dip/ cx for UTI concern. Tylenol and pyridium for discomfort sent to pharmacy. It pt develops fevers, back pain, or feels ill should be evaluated in ED. Advise pt that urine may turn bright orange from pyridium

## 2016-10-18 NOTE — Telephone Encounter (Signed)
[facility] [address] Patient is requesting Rx's be resent.

## 2016-10-19 ENCOUNTER — Telehealth: Payer: Self-pay | Admitting: Internal Medicine

## 2016-10-19 LAB — HEMOGLOBIN A1C: Hemoglobin A1C: 5.1 % (ref 4.0–6.0)

## 2016-10-19 NOTE — Telephone Encounter (Signed)
Started transportation form and placed it in Dr. Leodis LiverpoolPortuguese folder for review in clinic tomorrow afternoon

## 2016-10-19 NOTE — Telephone Encounter (Signed)
Patient called in regards to transportation form  Patient states that she is unable to take public transportation due to medical conditions     Patient states that transportation needs a new updated form  Patient would like this completed as soon as possible     Patient can be reached at [phone] to advise

## 2016-10-22 NOTE — Telephone Encounter (Signed)
Form was completed and faxed back to [employer]

## 2016-10-25 ENCOUNTER — Ambulatory Visit: Payer: Medicaid (Managed Care) | Admitting: Psychiatry

## 2016-10-27 ENCOUNTER — Telehealth: Payer: Self-pay | Admitting: Internal Medicine

## 2016-10-27 NOTE — Telephone Encounter (Signed)
Message noted.  Will direct to ordering provider.

## 2016-10-27 NOTE — Telephone Encounter (Signed)
Ms. Katrinka BlazingSmith is requesting a return call to review her recent test results.    What type of test was done? Bloodwork    Please return the patients call at [phone]

## 2016-10-31 ENCOUNTER — Telehealth: Payer: Self-pay | Admitting: Student in an Organized Health Care Education/Training Program

## 2016-10-31 NOTE — Telephone Encounter (Signed)
Returned patient's call. No answer and voicemail not set up to receive voice messages.

## 2016-11-08 ENCOUNTER — Encounter (HOSPITAL_COMMUNITY): Payer: Self-pay | Admitting: Family Medicine

## 2016-11-08 ENCOUNTER — Ambulatory Visit (HOSPITAL_COMMUNITY)
Admission: EM | Admit: 2016-11-08 | Discharge: 2016-11-08 | Disposition: A | Discharge status: Home / Self Care | Payer: Medicaid Other | Attending: Family Medicine | Admitting: Family Medicine

## 2016-11-08 DIAGNOSIS — J0100 Acute maxillary sinusitis, unspecified: Secondary | ICD-10-CM

## 2016-11-08 MED ORDER — AMOXICILLIN 500 MG PO CAPS: 1000.0000 mg | ORAL_CAPSULE | Freq: Two times a day (BID) | ORAL | 0 refills | Status: DC

## 2016-11-08 NOTE — ED Triage Notes (Signed)
PT reports left side facial pain and congestion for 2 weeks. PT has tried OTC meds without relief.

## 2016-11-10 NOTE — ED Provider Notes (Signed)
  [facility]   [id number] 11/08/16 Arrival Time: 1506  ASSESSMENT & PLAN:  1. Acute non-recurrent maxillary sinusitis     Meds ordered this encounter  Medications  . amoxicillin (AMOXIL) 500 MG capsule    Sig: Take 2 capsules (1,000 mg total) by mouth 2 (two) times daily.    Dispense:  40 capsule    Refill:  0   OTC analgesics and symptom care as needed. May f/u as needed. Reviewed expectations re: course of current medical issues. Questions answered. Outlined signs and symptoms indicating need for more acute intervention. Patient verbalized understanding. After Visit Summary given.   SUBJECTIVE:  Samantha Nguyen is a 31 y.o. female who presents with complaint of nasal congestion, post-nasal drainage for the past 2 weeks. Likely related to seasonal allergies. Now with facial pressure, mostly L-sided. Afebrile. No significant respiratory symptoms. OTC decongestant with mild help. H/O infrequent sinusitis.  ROS: As per HPI.   OBJECTIVE:  Vitals:   11/08/16 1521 11/08/16 1522  BP:  131/80  Pulse:  91  Resp:  16  Temp:  98.6 F (37 C)  TempSrc:  Oral  SpO2:  100%  Weight: 175 lb (79.4 kg)   Height: 5\' 7"  (1.702 m)      General appearance: alert; no distress HEENT: nasal congestion; throat irritation secondary to post-nasal drainage; L-sided maxillary sinus tenderness Neck: supple without LAD Lungs: clear to auscultation bilaterally Skin: warm and dry Psychological:  alert and cooperative; normal mood and affect   Allergies  Allergen Reactions  . Sulfa Antibiotics Other (See Comments)    Unknown, happened when she was a child    Past Medical History:  Diagnosis Date  . Anxiety   . Depressed   . History of kidney stones   . Migraine     Past Surgical History:  Procedure Laterality Date  . CESAREAN SECTION    . CYSTOSCOPY W/ URETERAL STENT PLACEMENT Right 08/24/2016   Procedure: CYSTOSCOPY WITH RETROGRADE PYELOGRAM/URETERAL STENT PLACEMENT;   Surgeon: Malen Gauze, MD;  Location: [facility];  Service: Urology;  Laterality: Right;  . CYSTOSCOPY WITH RETROGRADE PYELOGRAM, URETEROSCOPY AND STENT PLACEMENT Right 09/09/2016   Procedure: CYSTOSCOPY WITH RETROGRADE PYELOGRAM, URETEROSCOPY AND STENT REPLACEMENT;  Surgeon: Malen Gauze, MD;  Location: [facility];  Service: Urology;  Laterality: Right;  . MANDIBLE SURGERY             Mardella Layman, MD 11/10/16 [phone]

## 2016-12-20 ENCOUNTER — Ambulatory Visit: Payer: MEDICAID | Admitting: Psychiatry

## 2016-12-24 ENCOUNTER — Telehealth: Payer: Self-pay | Admitting: Internal Medicine

## 2016-12-24 NOTE — Telephone Encounter (Signed)
Jenna [name] called to schedule an appointment for symptoms of shortness of breath when walking and heavy snoring during hours of sleep. At the patients request she is scheduled for 10/19 at 11:30am with Tammy Harcleroad.

## 2016-12-27 LAB — UNMAPPED LAB RESULTS
Basophil # (HT): 0 10 3/uL — NL (ref 0.0–0.2)
Basophil % (HT): 0 % — NL (ref 0–3)
Eosinophil # (HT): 0.1 10 3/uL — NL (ref 0.0–0.6)
Eosinophil % (HT): 1 % — NL (ref 0–5)
Hematocrit (HT): 29 % — ABNORMAL LOW (ref 35–47)
Hemoglobin (HGB) (HT): 8 g/dL — ABNORMAL LOW (ref 12.0–16.0)
Lymphocyte # (HT): 2.9 10 3/uL — NL (ref 1.0–4.8)
Lymphocyte % (HT): 21 % — NL (ref 15–45)
MCHC (HT): 27.9 g/dL — ABNORMAL LOW (ref 31.0–37.5)
MCV (HT): 76 fL — ABNORMAL LOW (ref 80–100)
Mean Corpuscular Hemoglobin (MCH) (HT): 21.2 pg — ABNORMAL LOW (ref 26.0–34.0)
Monocyte # (HT): 0.9 10 3/uL — NL (ref 0.1–1.0)
Monocyte % (HT): 7 % — NL (ref 0–15)
Neutrophil # (HT): 9.8 10 3/uL — ABNORMAL HIGH (ref 1.8–8.0)
Platelets (HT): 482 10 3/uL — ABNORMAL HIGH (ref 150–450)
RBC (HT): 3.78 10 6/uL — ABNORMAL LOW (ref 3.80–5.20)
RDW (HT): 16.7 % — ABNORMAL HIGH (ref 0.0–15.2)
Seg Neut % (HT): 71 % — NL (ref 45–75)
WBC (HT): 13.8 10 3/uL — ABNORMAL HIGH (ref 4.0–11.0)

## 2016-12-30 ENCOUNTER — Ambulatory Visit: Payer: MEDICAID | Admitting: Dermatology

## 2016-12-31 ENCOUNTER — Ambulatory Visit: Payer: MEDICAID | Admitting: Obstetrics and Gynecology

## 2017-01-24 ENCOUNTER — Other Ambulatory Visit: Payer: Self-pay | Admitting: Internal Medicine

## 2017-01-24 DIAGNOSIS — I10 Essential (primary) hypertension: Secondary | ICD-10-CM

## 2017-01-24 DIAGNOSIS — G43709 Chronic migraine without aura, not intractable, without status migrainosus: Secondary | ICD-10-CM

## 2017-01-24 NOTE — Telephone Encounter (Signed)
Jenna MaxinAshley Eblin calling to request prescription(s)   Requested Prescriptions     Pending Prescriptions Disp Refills    metoprolol (TOPROL-XL) 25 MG 24 hr tablet 30 tablet 5     Sig: Take 1 tablet (25 mg total) by mouth daily   Do not crush or chew. May be divided.      [facility] be [facility] the [facility] [address]    Patient states she's not 100 % sure this is the right medication, but knows it's blood pressure medication.    Is patient out of the medication? yes  Does the patient have questions regarding the medication for the nurse? no    Patient states she currently doesn't have a call back number.

## 2017-01-25 MED ORDER — METOPROLOL SUCCINATE 25 MG PO TB24 *I*
25.0000 mg | ORAL_TABLET | Freq: Every day | ORAL | 5 refills | Status: DC
Start: 2017-01-25 — End: 2017-11-17

## 2017-03-02 ENCOUNTER — Other Ambulatory Visit: Payer: Self-pay | Admitting: Internal Medicine

## 2017-03-02 DIAGNOSIS — R0602 Shortness of breath: Secondary | ICD-10-CM | POA: Insufficient documentation

## 2017-03-02 DIAGNOSIS — R350 Frequency of micturition: Secondary | ICD-10-CM

## 2017-03-02 DIAGNOSIS — R5383 Other fatigue: Secondary | ICD-10-CM

## 2017-03-02 DIAGNOSIS — J45909 Unspecified asthma, uncomplicated: Secondary | ICD-10-CM

## 2017-03-02 DIAGNOSIS — D649 Anemia, unspecified: Principal | ICD-10-CM | POA: Insufficient documentation

## 2017-03-02 DIAGNOSIS — F17210 Nicotine dependence, cigarettes, uncomplicated: Secondary | ICD-10-CM | POA: Insufficient documentation

## 2017-03-02 LAB — UNMAPPED LAB RESULTS
Basophil # (HT): 0 10 3/uL — NL (ref 0.0–0.2)
Basophil % (HT): 0 % — NL (ref 0–3)
Eosinophil # (HT): 0.1 10 3/uL — NL (ref 0.0–0.6)
Eosinophil % (HT): 1 % — NL (ref 0–5)
Hematocrit (HT): 22 % — ABNORMAL LOW (ref 35–47)
Hemoglobin (HGB) (HT): 5.8 g/dL — ABNORMAL LOW (ref 12.0–16.0)
Lymphocyte # (HT): 3 10 3/uL — NL (ref 1.0–4.8)
Lymphocyte % (HT): 30 % — NL (ref 15–45)
MCHC (HT): 26 g/dL — ABNORMAL LOW (ref 31.0–37.5)
MCV (HT): 75 fL — ABNORMAL LOW (ref 80–100)
Mean Corpuscular Hemoglobin (MCH) (HT): 19.6 pg — ABNORMAL LOW (ref 26.0–34.0)
Monocyte # (HT): 0.6 10 3/uL — NL (ref 0.1–1.0)
Monocyte % (HT): 6 % — NL (ref 0–15)
Neutrophil # (HT): 6.2 10 3/uL — NL (ref 1.8–8.0)
Platelets (HT): 460 10 3/uL — ABNORMAL HIGH (ref 150–450)
RBC (HT): 2.96 10 6/uL — ABNORMAL LOW (ref 3.80–5.20)
RDW (HT): 17.3 % — ABNORMAL HIGH (ref 0.0–15.2)
Seg Neut % (HT): 62 % — NL (ref 45–75)
WBC (HT): 9.9 10 3/uL — NL (ref 4.0–11.0)

## 2017-03-02 MED ORDER — ALBUTEROL SULFATE HFA 108 (90 BASE) MCG/ACT IN AERS *I*
1.0000 | INHALATION_SPRAY | Freq: Four times a day (QID) | RESPIRATORY_TRACT | 5 refills | Status: DC | PRN
Start: 2017-03-02 — End: 2017-09-21

## 2017-03-02 MED ORDER — FLUTICASONE-SALMETEROL 100-50 MCG/ACT IN AEPB *I*
1.0000 | INHALATION_SPRAY | Freq: Two times a day (BID) | RESPIRATORY_TRACT | 3 refills | Status: DC
Start: 2017-03-02 — End: 2017-09-21

## 2017-03-02 NOTE — Progress Notes (Signed)
[facility] Mobile [facility] Team Note:    Team will complete a cold call as client cannot be reached by phone.

## 2017-03-02 NOTE — Progress Notes (Signed)
Mobile Crisis Referral  The following was submitted.  Submitted At 03/02/2017 3:10:04 PM   Patient Information   First Name Jenna Sheldonshley   Last Name [name]   [place] MRN    Date of Birth 1985/03/17   Gender Female   Parent Name (required if the patient is under the age of 31)    Address [address]   [place]   [place]   ZIP [id number]   Primary Phone [phone]   Secondary Phone    Type of Referral? None of the above   Reason for Mobile Crisis Referral Client's phone is out of service. Hx of 3 suicide attempts, impulsivity, and cocaine use. Has been endorsing SI over last several sessions. Last endorsed SI on 02/15/17. Last seen on 02/25/17. No showed to today's appointment. Referrer would like the following relayed: call Referrer to reschedule or call the After Hours line at [phone].   Requester Information   What is your full name? Lonzo CandyAlex Bissell   Requester Phone [phone]   Ext. Number [phone]   What is your relationship to the patient? [facility]   Requester Email (needed to receive a copy of this referral)

## 2017-03-02 NOTE — ED Triage Notes (Signed)
[facility] and was told she needed a blood transfusion. SHe left AMA because her husband was removed from the ED. [facility].       Triage Note   Marnee SpringKellie M Aubery Date, RN

## 2017-03-02 NOTE — Telephone Encounter (Signed)
Duwaine MaxinAshley Slaugh calling to request prescription(s)   Requested Prescriptions     Pending Prescriptions Disp Refills    fluticasone-salmeterol (ADVAIR) 100-50 mcg/dose diskus inhaler 1 Inhaler 3     Sig: Inhale 1 puff into the lungs 2 times daily    albuterol HFA (VENTOLIN HFA) 108 (90 Base) MCG/ACT inhaler 18 g 5     Sig: Inhale 1-2 puffs into the lungs every 6 hours as needed    FOR WHEEZING      to be sent to the [facility]    Is patient out of the medication? yes  Does the patient have questions regarding the medication for the nurse? no    Patient can be reached if necessary at [phone].

## 2017-03-03 ENCOUNTER — Encounter: Payer: Self-pay | Admitting: Psychiatry

## 2017-03-03 ENCOUNTER — Observation Stay
Admission: EM | Admit: 2017-03-03 | Discharge: 2017-03-03 | Disposition: A | Discharge status: Home / Self Care | Payer: Medicaid (Managed Care) | Source: Ambulatory Visit | Attending: Emergency Medicine | Admitting: Emergency Medicine

## 2017-03-03 ENCOUNTER — Encounter: Payer: Self-pay | Admitting: Emergency Medicine

## 2017-03-03 DIAGNOSIS — D500 Iron deficiency anemia secondary to blood loss (chronic): Secondary | ICD-10-CM

## 2017-03-03 DIAGNOSIS — D649 Anemia, unspecified: Secondary | ICD-10-CM

## 2017-03-03 LAB — FERRITIN: Ferritin: 5 ng/mL — ABNORMAL LOW (ref 10–120)

## 2017-03-03 LAB — CBC AND DIFFERENTIAL
Baso # K/uL: 0.1 10*3/uL (ref 0.0–0.1)
Basophil %: 0.5 %
Eos # K/uL: 0.2 10*3/uL (ref 0.0–0.4)
Eosinophil %: 1.6 %
Hematocrit: 22 % — ABNORMAL LOW (ref 34–45)
Hemoglobin: 5.9 g/dL — ABNORMAL LOW (ref 11.2–15.7)
IMM Granulocytes #: 0.1 10*3/uL (ref 0.0–0.1)
IMM Granulocytes: 0.6 %
Lymph # K/uL: 3.5 10*3/uL (ref 1.2–3.7)
Lymphocyte %: 33.8 %
MCH: 20 pg — ABNORMAL LOW (ref 26–32)
MCHC: 27 g/dL — ABNORMAL LOW (ref 32–36)
MCV: 75 fL — ABNORMAL LOW (ref 79–95)
Mono # K/uL: 0.7 10*3/uL (ref 0.2–0.9)
Monocyte %: 6.9 %
Neut # K/uL: 5.8 10*3/uL (ref 1.6–6.1)
Nucl RBC # K/uL: 0 10*3/uL (ref 0.0–0.0)
Nucl RBC %: 0 /100 WBC (ref 0.0–0.2)
Platelets: 426 10*3/uL — ABNORMAL HIGH (ref 160–370)
RBC: 2.9 MIL/uL — ABNORMAL LOW (ref 3.9–5.2)
RDW: 17.5 % — ABNORMAL HIGH (ref 11.7–14.4)
Seg Neut %: 56.6 %
WBC: 10.3 10*3/uL — ABNORMAL HIGH (ref 4.0–10.0)

## 2017-03-03 LAB — URINALYSIS REFLEX TO CULTURE
Blood,UA: NEGATIVE
Leuk Esterase,UA: NEGATIVE
Nitrite,UA: NEGATIVE
Protein,UA: NEGATIVE mg/dL
Specific Gravity,UA: 1.023 (ref 1.001–1.030)
pH,UA: 6.5 (ref 5.0–8.0)

## 2017-03-03 LAB — COMPREHENSIVE METABOLIC PANEL
ALT: 11 U/L (ref 0–35)
AST: 15 U/L (ref 0–35)
Albumin: 4 g/dL (ref 3.5–5.2)
Alk Phos: 79 U/L (ref 35–105)
Anion Gap: 11 (ref 7–16)
Bilirubin,Total: <0.2 mg/dL (ref 0.0–1.2)
CO2: 25 mmol/L (ref 20–28)
Calcium: 9.3 mg/dL (ref 8.8–10.2)
Chloride: 105 mmol/L (ref 96–108)
Creatinine: 0.99 mg/dL — ABNORMAL HIGH (ref 0.51–0.95)
GFR,Black: 88 *
GFR,Caucasian: 76 *
Globulin: 2.8 g/dL (ref 2.7–4.3)
Glucose: 99 mg/dL (ref 60–99)
Lab: 18 mg/dL (ref 6–20)
Potassium: 4.2 mmol/L (ref 3.3–5.1)
Sodium: 141 mmol/L (ref 133–145)
Total Protein: 6.8 g/dL (ref 6.3–7.7)

## 2017-03-03 LAB — TIBC
Iron: 23 ug/dL — ABNORMAL LOW (ref 34–165)
TIBC: 421 ug/dL (ref 250–450)
Transferrin Saturation: 5 % — ABNORMAL LOW (ref 15–50)

## 2017-03-03 LAB — PREGNANCY TEST, SERUM: Preg,Serum: NEGATIVE

## 2017-03-03 LAB — APTT: aPTT: 22.4 s — ABNORMAL LOW (ref 25.8–37.9)

## 2017-03-03 LAB — RETICULOCYTES
Retic %: 2.6 % — ABNORMAL HIGH (ref 0.7–2.1)
Retic Abs: 74.3 10*3/uL (ref 29.9–90.9)

## 2017-03-03 LAB — TYPE AND SCREEN
ABO RH Blood Type: A POS
Antibody Screen: NEGATIVE

## 2017-03-03 LAB — VAGINITIS SCREEN: DNA PROBE: Vaginitis Screen:DNA Probe: 0

## 2017-03-03 LAB — TRANSFERRIN: Transferrin: 347 mg/dL (ref 200–360)

## 2017-03-03 LAB — PROTIME-INR
INR: 1 (ref 0.9–1.1)
Protime: 11.4 s (ref 10.0–12.9)

## 2017-03-03 MED ORDER — KETOROLAC TROMETHAMINE 30 MG/ML IJ SOLN *I*
15.0000 mg | Freq: Once | INTRAMUSCULAR | Status: AC
Start: 2017-03-03 — End: 2017-03-03
  Administered 2017-03-03: 15 mg via INTRAVENOUS
  Filled 2017-03-03: qty 1

## 2017-03-03 MED ORDER — POLYETHYLENE GLYCOL 3350 PO PACK 17 GM *I*
17.0000 g | PACK | Freq: Every day | ORAL | 0 refills | Status: DC
Start: 2017-03-03 — End: 2017-06-07

## 2017-03-03 MED ORDER — FERROUS SULFATE 325 (65 FE) MG PO TABS *WRAPPED* *I*
325.0000 mg | ORAL_TABLET | Freq: Two times a day (BID) | ORAL | 0 refills | Status: DC
Start: 2017-03-03 — End: 2017-04-22

## 2017-03-03 MED ORDER — DEXTROSE 5 % FLUSH FOR PUMPS *I*
0.0000 mL/h | INTRAVENOUS | Status: DC | PRN
Start: 2017-03-03 — End: 2017-03-03

## 2017-03-03 MED ORDER — DOCUSATE SODIUM 100 MG PO CAPS *I*
100.0000 mg | ORAL_CAPSULE | Freq: Two times a day (BID) | ORAL | 0 refills | Status: AC
Start: 2017-03-03 — End: 2017-04-02

## 2017-03-03 MED ORDER — SODIUM CHLORIDE 0.9 % IV SOLN WRAPPED *I*
3.0000 mL/h | Status: DC
Start: 2017-03-03 — End: 2017-03-03

## 2017-03-03 MED ORDER — SODIUM CHLORIDE 0.9 % FLUSH FOR PUMPS *I*
0.0000 mL/h | INTRAVENOUS | Status: DC | PRN
Start: 2017-03-03 — End: 2017-03-03

## 2017-03-03 NOTE — ED Notes (Signed)
Pt reports to ED complaining of 8/10 R flank pain x "a few days" accompanied by urinary frequency. States that she [facility] done and was told that she was "severely anemic and they said I would need a blood transfusion." States that she has a hx of anemia but has never needed a transfusion in the past. Endorses SOB upon exertion and light headedness with change in position. Adds that she has irregular periods and states she just finished her period where she had been passing clots the size of a baseball. Denies nausea, vomiting. Denies any current vaginal bleeding. Additionally, pt states she [facility] "because of a situation we had with my husband and the staff." Currently resting in bed, calm, cooperative. Call light in reach.

## 2017-03-03 NOTE — Progress Notes (Signed)
Cold call attempted to home - no answer to door. Per e-records, patient currently at [facility]. Brochure in sealed envelope w/message from therapist left in door. Writer spoke w/referrer and updated him that team was unable to locate patient but did leave brochure w/instructions in her door. Case closed.

## 2017-03-03 NOTE — Progress Notes (Signed)
03/03/17 1300   [facility] Patient Class Review   Patient Class Review Observation

## 2017-03-03 NOTE — ED Notes (Signed)
Blood transfusion complete. Pt tolerated well, IV saline locked. Pt with no complaints at this time, resting in bed.

## 2017-03-03 NOTE — Discharge Instructions (Signed)
Continue on all of your current medication and care plan- with exception list below, if any  Follow up with your doctor in the next few days  Return if further problem or concern      In Addition-  If condition changes or worsen, return immediately    Start on iron therapy twice daily- take the pill with small amount of orange juice  Colace and miralax to prevent constipation  Need to consult with your Gyn doctor to address the issue of heavy menses- which is the source of your blood loss and anemia  Follow up with your doctor in about 10-14 days to check on your progress with the iron treatment. Have your blood test done before seeing your doctor. Order is in place.

## 2017-03-03 NOTE — ED Notes (Signed)
Pt tolerated first 15 minutes of blood transfusion well, no signs of reaction at this time. BRCs changed to 14125ml/hr. VSS. Will continue to monitor.
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
## 2017-03-04 LAB — RED BLOOD CELLS
Coded Blood type: 6200
Component blood type: A POS
Dispense status: TRANSFUSED

## 2017-03-15 HISTORY — PX: PR LIG/TRNSXJ FALOPIAN TUBE CESAREAN DEL/ABDML SURG: 58611
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
## 2017-03-31 ENCOUNTER — Ambulatory Visit: Payer: Medicaid (Managed Care) | Admitting: Obstetrics and Gynecology

## 2017-04-11 ENCOUNTER — Ambulatory Visit: Payer: Medicaid (Managed Care) | Admitting: Obstetrics and Gynecology

## 2017-04-20 ENCOUNTER — Ambulatory Visit: Payer: Medicaid (Managed Care) | Admitting: Obstetrics and Gynecology

## 2017-04-22 ENCOUNTER — Encounter: Payer: Self-pay | Admitting: Obstetrics and Gynecology

## 2017-04-22 ENCOUNTER — Ambulatory Visit: Payer: Medicaid (Managed Care) | Attending: Obstetrics and Gynecology | Admitting: Obstetrics and Gynecology

## 2017-04-22 VITALS — BP 157/78 | HR 74 | Ht 65.0 in | Wt 295.2 lb

## 2017-04-22 DIAGNOSIS — Z87898 Personal history of other specified conditions: Secondary | ICD-10-CM

## 2017-04-22 DIAGNOSIS — Z3200 Encounter for pregnancy test, result unknown: Secondary | ICD-10-CM

## 2017-04-22 DIAGNOSIS — Z3201 Encounter for pregnancy test, result positive: Secondary | ICD-10-CM

## 2017-04-22 DIAGNOSIS — F17200 Nicotine dependence, unspecified, uncomplicated: Secondary | ICD-10-CM

## 2017-04-22 DIAGNOSIS — L732 Hidradenitis suppurativa: Secondary | ICD-10-CM

## 2017-04-22 DIAGNOSIS — F1491 Cocaine use, unspecified, in remission: Secondary | ICD-10-CM

## 2017-04-22 LAB — POCT URINE PREGNANCY
Lot #: 181061
Preg Test,UR POC: POSITIVE — AB

## 2017-04-22 MED ORDER — FERROUS SULFATE 325 (65 FE) MG PO TABS *WRAPPED* *I*
325.0000 mg | ORAL_TABLET | Freq: Two times a day (BID) | ORAL | 0 refills | Status: DC
Start: 2017-04-22 — End: 2017-11-16

## 2017-04-22 MED ORDER — CHLORHEXIDINE GLUCONATE 4 % EX LIQD *WRAPPED*
Freq: Every day | CUTANEOUS | 1 refills | Status: DC
Start: 2017-04-22 — End: 2017-07-26

## 2017-04-22 MED ORDER — CLASSIC PRENATAL 28-0.8 MG PO TABS *I*
1.0000 | ORAL_TABLET | Freq: Every day | ORAL | 1 refills | Status: DC
Start: 2017-04-22 — End: 2017-04-22

## 2017-04-22 MED ORDER — CLASSIC PRENATAL 28-0.8 MG PO TABS *I*
1.0000 | ORAL_TABLET | Freq: Every day | ORAL | 1 refills | Status: AC
Start: 2017-04-22 — End: 2017-10-19
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
## 2017-04-22 NOTE — Progress Notes (Signed)
[facility] SOCIAL WORK  PHARMACY FORM     Todays date:  April 22, 2017    Patient Name: Jenna [name]      Medical Record #: [id number]   DOB: 06/01/1985  Patients Address: [address]                Social Worker: Elissa HeftySarah Cameo Schmiesing, LMSW       Date of Service: April 22, 2017       Funding Source: SW Medication Assistance Fund - if insurance won't cover, please use this fund  ___________________________________________________________________    Pharmacy Information:  Date/time sent: April 22, 2017     Time needed: 04/22/17    Patient Location: Outpatient - women's health practice    Medication Pick-up Preference: Patient will pick up at the pharmacy    Pharmacy Contact: Lupita LeashDonna, [phone]  Social work signature: Elissa HeftySarah Khalifa Knecht, LMSW  Supervisor/Manager Approval (if indicated):  Date:  (supervisor signature not required for Medicaid pending)
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
## 2017-04-25 NOTE — Progress Notes (Signed)
Pt in the building for study appt. Asked to speak to nurse re N/V. Pt is 8.1 w by LMP, dating scan is next week 05/04/17. PT states "nothing is staying down" 24 hour dietary recall: coffee, french toast, smokes cigarettes.requesting RX be sent to pharmacy. Reviewed OTC meds with pt, ways to decrease N/V.First trimester folder given to pt to review. Advised if sx extreme she could go to ED, pt declines. Pt aware she would need appt for an RX. May try otc tx, call if needs appt for N/V.

## 2017-05-04 ENCOUNTER — Other Ambulatory Visit: Payer: Medicaid (Managed Care)

## 2017-05-08 ENCOUNTER — Encounter (HOSPITAL_COMMUNITY): Payer: Self-pay | Admitting: Emergency Medicine

## 2017-05-08 ENCOUNTER — Ambulatory Visit (HOSPITAL_COMMUNITY)
Admission: EM | Admit: 2017-05-08 | Discharge: 2017-05-08 | Disposition: A | Discharge status: Home / Self Care | Payer: Medicaid Other | Attending: Family Medicine | Admitting: Family Medicine

## 2017-05-08 ENCOUNTER — Other Ambulatory Visit: Payer: Self-pay

## 2017-05-08 DIAGNOSIS — H9202 Otalgia, left ear: Secondary | ICD-10-CM | POA: Diagnosis not present

## 2017-05-08 MED ORDER — FLUTICASONE PROPIONATE 50 MCG/ACT NA SUSP: 1.0000 | Freq: Every day | NASAL | 2 refills | Status: DC

## 2017-05-08 NOTE — ED Provider Notes (Signed)
[facility]    CSN: [id number] Arrival date & time: 05/08/17  1737     History   Chief Complaint Chief Complaint  Patient presents with  . Otalgia    HPI Dietra Stokely is a 32 y.o. female.   Osa presents with complaints of left ear pain. She states 1 week ago she develop congestion, facial pressure, runny nose and fevers. She had left over augmentin so she started taking that, 1/2 tab twice a day. Symptoms generally have resolved. Still with occasional cough, but now with left ear pain and difficulty hearing. Pain 8/10. Has been taking benadryl at night which has not helped. No further fevers. States has frequent sinus and ear infections.    ROS per HPI.       Past Medical History:  Diagnosis Date  . Anxiety   . Depressed   . History of kidney stones   . Migraine     Patient Active Problem List   Diagnosis Date Noted  . Sepsis due to urinary tract infection (HCC) 08/24/2016    Past Surgical History:  Procedure Laterality Date  . CESAREAN SECTION    . CYSTOSCOPY W/ URETERAL STENT PLACEMENT Right 08/24/2016   Procedure: CYSTOSCOPY WITH RETROGRADE PYELOGRAM/URETERAL STENT PLACEMENT;  Surgeon: Malen Gauze, MD;  Location: [facility];  Service: Urology;  Laterality: Right;  . CYSTOSCOPY WITH RETROGRADE PYELOGRAM, URETEROSCOPY AND STENT PLACEMENT Right 09/09/2016   Procedure: CYSTOSCOPY WITH RETROGRADE PYELOGRAM, URETEROSCOPY AND STENT REPLACEMENT;  Surgeon: Malen Gauze, MD;  Location: [facility];  Service: Urology;  Laterality: Right;  . MANDIBLE SURGERY      OB History    No data available       Home Medications    Prior to Admission medications   Medication Sig Start Date End Date Taking? Authorizing Provider  Amoxicillin-Pot Clavulanate (AUGMENTIN PO) Take by mouth.   Yes [provider]  PARoxetine (PAXIL) 30 MG tablet Take 30 mg by mouth daily.   Yes [provider]  acetaminophen (TYLENOL) 500 MG  tablet Take 500 mg by mouth every 6 (six) hours as needed for fever.    [provider]  amoxicillin (AMOXIL) 500 MG capsule Take 2 capsules (1,000 mg total) by mouth 2 (two) times daily. Patient not taking: Reported on 05/08/2017 11/08/16   Mardella Layman, MD  cetirizine (ZYRTEC) 10 MG tablet Take 10 mg by mouth daily.    [provider]  Cholecalciferol (VITAMIN D3) 5000 units TABS Take 1 tablet by mouth daily.    [provider]  Cranberry 425 MG CAPS Take 1 capsule by mouth daily.    [provider]  fluticasone (FLONASE) 50 MCG/ACT nasal spray Place 1 spray into both nostrils daily. 05/08/17   Georgetta Haber, NP  ibuprofen (ADVIL,MOTRIN) 200 MG tablet Take 200 mg by mouth every 6 (six) hours as needed for fever or mild pain.    [provider]  Multiple Vitamins-Minerals (MULTIVITAMIN ADULT) TABS Take 1 tablet by mouth daily.    [provider]  oxyCODONE-acetaminophen (PERCOCET/ROXICET) 5-325 MG tablet Take 1-2 tablets by mouth every 4 (four) hours as needed for moderate pain. Patient not taking: Reported on 05/08/2017 09/09/16   Malen Gauze, MD  tamsulosin (FLOMAX) 0.4 MG CAPS capsule Take 0.4 mg by mouth daily after supper.    [provider]  traMADol (ULTRAM) 50 MG tablet Take 50 mg by mouth every 6 (six) hours as needed for moderate pain.  [provider]    Family History No family history on file.  Social History Social History   Tobacco Use  . Smoking status: Former Games developer  . Smokeless tobacco: Never Used  Substance Use Topics  . Alcohol use: Yes    Comment: occasionally  . Drug use: No     Allergies   Sulfa antibiotics   Review of Systems Review of Systems   Physical Exam Triage Vital Signs ED Triage Vitals [05/08/17 1822]  Enc Vitals Group     BP (!) 144/94     Pulse Rate 84     Resp 18     Temp 98.5 F (36.9 C)     Temp src      SpO2 100 %     Weight      Height      Head  Circumference      Peak Flow      Pain Score 8     Pain Loc      Pain Edu?      Excl. in GC?    No data found.  Updated Vital Signs BP (!) 144/94   Pulse 84   Temp 98.5 F (36.9 C)   Resp 18   LMP 04/10/2017   SpO2 100%   Visual Acuity Right Eye Distance:   Left Eye Distance:   Bilateral Distance:    Right Eye Near:   Left Eye Near:    Bilateral Near:     Physical Exam  Constitutional: She is oriented to person, place, and time. She appears well-developed and well-nourished. No distress.  HENT:  Head: Normocephalic and atraumatic.  Right Ear: Tympanic membrane, external ear and ear canal normal.  Left Ear: External ear and ear canal normal. No tenderness. A middle ear effusion is present.  Nose: Nose normal.  Mouth/Throat: Uvula is midline, oropharynx is clear and moist and mucous membranes are normal. No tonsillar exudate.  Mild effusion noted without bulging or redness present  Eyes: Conjunctivae and EOM are normal. Pupils are equal, round, and reactive to light.  Cardiovascular: Normal rate, regular rhythm and normal heart sounds.  Pulmonary/Chest: Effort normal and breath sounds normal.  Occasional dry cough noted  Neurological: She is alert and oriented to person, place, and time.  Skin: Skin is warm and dry.     UC Treatments / Results  Labs (all labs ordered are listed, but only abnormal results are displayed) Labs Reviewed - No data to display  EKG  EKG Interpretation None       Radiology No results found.  Procedures Procedures (including critical care time)  Medications Ordered in UC Medications - No data to display   Initial Impression / Assessment and Plan / UC Course  I have reviewed the triage vital signs and the nursing notes.  Pertinent labs & imaging results that were available during my care of the patient were reviewed by me and considered in my medical decision making (see chart for details).     flonase daily. Push fluids.  Discussed appropriate antibiotic usage. If symptoms worsen or do not improve in the next week to return to be seen or to follow up with PCP.  Patient verbalized understanding and agreeable to plan.    Final Clinical Impressions(s) / UC Diagnoses   Final diagnoses:  Otalgia of left ear    ED Discharge Orders        Ordered    fluticasone (FLONASE) 50 MCG/ACT nasal spray  Daily  05/08/17 1838       Controlled Substance Prescriptions [place] Controlled Substance Registry consulted? Not Applicable   Georgetta HaberBurky, Marda Breidenbach B, NP 05/08/17 [phone]

## 2017-05-08 NOTE — ED Triage Notes (Signed)
Pt c/o cold symptoms and L ear pain, states "I cant hear out of my L ear".

## 2017-05-08 NOTE — Discharge Instructions (Signed)
Push fluids to ensure adequate hydration and keep secretions thin.  Tylenol and/or ibuprofen as needed for pain or fevers.  Daily flonase may help with pain, use once daily. If symptoms worsen or do not improve in the next week to return to be seen or to follow up with your PCP.

## 2017-05-10 ENCOUNTER — Ambulatory Visit: Payer: Medicaid (Managed Care) | Attending: Obstetrics and Gynecology

## 2017-05-10 DIAGNOSIS — Z3201 Encounter for pregnancy test, result positive: Secondary | ICD-10-CM | POA: Insufficient documentation

## 2017-05-10 DIAGNOSIS — Z3200 Encounter for pregnancy test, result unknown: Secondary | ICD-10-CM | POA: Insufficient documentation

## 2017-05-10 DIAGNOSIS — Z3687 Encounter for antenatal screening for uncertain dates: Secondary | ICD-10-CM | POA: Insufficient documentation

## 2017-05-10 DIAGNOSIS — O99331 Smoking (tobacco) complicating pregnancy, first trimester: Secondary | ICD-10-CM | POA: Insufficient documentation

## 2017-05-10 DIAGNOSIS — O30041 Twin pregnancy, dichorionic/diamniotic, first trimester: Secondary | ICD-10-CM | POA: Insufficient documentation

## 2017-05-10 DIAGNOSIS — O10011 Pre-existing essential hypertension complicating pregnancy, first trimester: Secondary | ICD-10-CM | POA: Insufficient documentation

## 2017-05-16 ENCOUNTER — Telehealth: Payer: Self-pay

## 2017-05-16 ENCOUNTER — Ambulatory Visit: Payer: Medicaid (Managed Care)

## 2017-05-16 NOTE — Telephone Encounter (Signed)
Attempt was made to contact pt for scheduled.. Message was left at [phone] [facility] phone [facility]PI.

## 2017-05-17 ENCOUNTER — Encounter: Payer: Self-pay | Admitting: Obstetrics and Gynecology

## 2017-05-17 NOTE — Telephone Encounter (Signed)
Second attempt made to reach patient. Line goes directly to voicemail and message left asking patient to call office.     [facility]. [facility] [phone]. Will schedule concurrently with SW at NOB visit.

## 2017-05-19 ENCOUNTER — Ambulatory Visit: Payer: Medicaid (Managed Care)

## 2017-05-19 ENCOUNTER — Telehealth: Payer: Self-pay

## 2017-05-19 NOTE — Telephone Encounter (Signed)
[facility] for [facility] today. Attempted to reach patient by phone and voicemail left asking her to call office back. Will mail letter with new appointment date and time per protocol.

## 2017-05-19 NOTE — Telephone Encounter (Signed)
Letter printed and mailed.  

## 2017-05-26 ENCOUNTER — Ambulatory Visit: Payer: Medicaid (Managed Care)

## 2017-05-27 ENCOUNTER — Telehealth: Payer: Self-pay

## 2017-05-27 ENCOUNTER — Ambulatory Visit: Payer: Medicaid (Managed Care)

## 2017-05-27 NOTE — Telephone Encounter (Signed)
Attempt to reach pt on [phone] for phone NPI.  Female states pt is at home, and he is out with the phone.  No other number to reach pt.  Message left asking female to encourage pt to telephone the [facility].  If pt calls back prior to 11:30, will complete the NPI.

## 2017-05-31 ENCOUNTER — Ambulatory Visit: Payer: Medicaid (Managed Care)

## 2017-06-07 ENCOUNTER — Ambulatory Visit: Payer: Medicaid (Managed Care) | Attending: Obstetrics and Gynecology | Admitting: Obstetrics and Gynecology

## 2017-06-07 ENCOUNTER — Ambulatory Visit: Payer: Medicaid (Managed Care)

## 2017-06-07 VITALS — BP 133/63 | HR 87 | Ht 65.0 in | Wt 288.0 lb

## 2017-06-07 DIAGNOSIS — F32A Depression, unspecified: Secondary | ICD-10-CM

## 2017-06-07 DIAGNOSIS — O0992 Supervision of high risk pregnancy, unspecified, second trimester: Secondary | ICD-10-CM | POA: Insufficient documentation

## 2017-06-07 DIAGNOSIS — L732 Hidradenitis suppurativa: Secondary | ICD-10-CM | POA: Insufficient documentation

## 2017-06-07 DIAGNOSIS — J45909 Unspecified asthma, uncomplicated: Secondary | ICD-10-CM | POA: Insufficient documentation

## 2017-06-07 DIAGNOSIS — Z720 Tobacco use: Secondary | ICD-10-CM | POA: Insufficient documentation

## 2017-06-07 DIAGNOSIS — R52 Pain, unspecified: Secondary | ICD-10-CM | POA: Insufficient documentation

## 2017-06-07 DIAGNOSIS — Z3A14 14 weeks gestation of pregnancy: Secondary | ICD-10-CM | POA: Insufficient documentation

## 2017-06-07 DIAGNOSIS — F1411 Cocaine abuse, in remission: Secondary | ICD-10-CM | POA: Insufficient documentation

## 2017-06-07 DIAGNOSIS — O30042 Twin pregnancy, dichorionic/diamniotic, second trimester: Secondary | ICD-10-CM | POA: Insufficient documentation

## 2017-06-07 DIAGNOSIS — O34219 Maternal care for unspecified type scar from previous cesarean delivery: Secondary | ICD-10-CM | POA: Insufficient documentation

## 2017-06-07 DIAGNOSIS — O0990 Supervision of high risk pregnancy, unspecified, unspecified trimester: Secondary | ICD-10-CM | POA: Insufficient documentation

## 2017-06-07 DIAGNOSIS — F329 Major depressive disorder, single episode, unspecified: Secondary | ICD-10-CM | POA: Insufficient documentation

## 2017-06-07 LAB — POCT URINALYSIS DIPSTICK
Blood,UA POCT: NEGATIVE
Glucose,UA POCT: NORMAL mg/dL
Ketones,UA POCT: NEGATIVE mg/dL
Lot #: 33828706
Nitrite,UA POCT: POSITIVE — AB
PH,UA POCT: 9 (ref 5–8)

## 2017-06-07 LAB — DRUG SCREEN CHEMICAL DEPENDENCY, URINE
Amphetamine,UR: NEGATIVE
Benzodiazepinen,UR: NEGATIVE
Cocaine/Metab,UR: NEGATIVE
Opiates,UR: NEGATIVE
THC Metabolite,UR: NEGATIVE

## 2017-06-07 MED ORDER — ACETAMINOPHEN 325 MG PO TABS *I*
650.0000 mg | ORAL_TABLET | Freq: Four times a day (QID) | ORAL | 1 refills | Status: DC | PRN
Start: 2017-06-07 — End: 2017-11-16

## 2017-06-07 MED ORDER — ASPIRIN 81 MG PO TABS *I*
81.0000 mg | ORAL_TABLET | Freq: Every day | ORAL | 2 refills | Status: DC
Start: 2017-06-07 — End: 2017-11-16

## 2017-06-07 MED ORDER — PRENATAL PLUS IRON 29-1 MG PO TABS *I*
1.0000 | ORAL_TABLET | Freq: Every day | ORAL | 5 refills | Status: DC
Start: 2017-06-07 — End: 2017-06-07

## 2017-06-07 MED ORDER — ONDANSETRON HCL 4 MG PO TABS *I*
4.0000 mg | ORAL_TABLET | Freq: Three times a day (TID) | ORAL | 1 refills | Status: DC | PRN
Start: 2017-06-07 — End: 2017-11-16

## 2017-06-07 NOTE — Progress Notes (Signed)
NPI completion with resource nurse:    The patient consented to testing for HIV testing and CDS. The patient declined testing for nothing.First and Second trimester teaching was done.     HIV pretest counseling for less than 8 minutes.    The patient verbally indicated she understands the teaching and plan.     Pregnancy education folder given and reviewed. Yes  Indicate 1st or 2nd folder both    Healthcare Proxy addressed. SH 1100 MR form given and reviewed.   Patient to return form upon completion.     Currently enrolled with MyChart. Yes     [facility] and was given instructions and supplies for 24 hour urine collection.       Hayden RasmussenSarah Rowdy Guerrini, RN 06/07/2017
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
## 2017-06-08 ENCOUNTER — Encounter: Payer: Self-pay | Admitting: Obstetrics and Gynecology

## 2017-06-08 LAB — CHLAMYDIA PLASMID DNA AMPLIFICATION: Chlamydia Plasmid DNA Amplification: 0

## 2017-06-08 LAB — VAGINITIS SCREEN: DNA PROBE: Vaginitis Screen:DNA Probe: 0

## 2017-06-08 LAB — N. GONORRHOEAE DNA AMPLIFICATION: N. gonorrhoeae DNA Amplification: 0

## 2017-06-09 ENCOUNTER — Encounter: Payer: Self-pay | Admitting: Obstetrics and Gynecology

## 2017-06-09 DIAGNOSIS — O30049 Twin pregnancy, dichorionic/diamniotic, unspecified trimester: Secondary | ICD-10-CM | POA: Insufficient documentation

## 2017-06-09 LAB — AEROBIC CULTURE

## 2017-06-10 ENCOUNTER — Encounter: Payer: Self-pay | Admitting: Obstetrics and Gynecology

## 2017-06-10 DIAGNOSIS — N390 Urinary tract infection, site not specified: Secondary | ICD-10-CM

## 2017-06-10 LAB — GYN CYTOLOGY

## 2017-06-10 MED ORDER — NITROFURANTOIN MONOHYD MACRO 100 MG PO CAPS *I*
100.0000 mg | ORAL_CAPSULE | Freq: Two times a day (BID) | ORAL | 0 refills | Status: DC
Start: 2017-06-10 — End: 2017-11-16

## 2017-06-17 ENCOUNTER — Encounter: Payer: Self-pay | Admitting: Obstetrics and Gynecology

## 2017-06-17 NOTE — Progress Notes (Signed)
Telephone call to patient. Informed her of UTI. She plans to pick up her prescription for macrobid today for treatment.  Jenna HighmanKATHRYN Caydance Kuehnle, NP

## 2017-06-21 ENCOUNTER — Ambulatory Visit: Payer: Medicaid (Managed Care) | Attending: Obstetrics

## 2017-06-21 DIAGNOSIS — O99332 Smoking (tobacco) complicating pregnancy, second trimester: Secondary | ICD-10-CM | POA: Insufficient documentation

## 2017-06-21 DIAGNOSIS — Z363 Encounter for antenatal screening for malformations: Secondary | ICD-10-CM | POA: Insufficient documentation

## 2017-06-21 DIAGNOSIS — O30042 Twin pregnancy, dichorionic/diamniotic, second trimester: Secondary | ICD-10-CM | POA: Insufficient documentation

## 2017-06-21 DIAGNOSIS — O99212 Obesity complicating pregnancy, second trimester: Secondary | ICD-10-CM | POA: Insufficient documentation

## 2017-06-21 DIAGNOSIS — O10012 Pre-existing essential hypertension complicating pregnancy, second trimester: Secondary | ICD-10-CM

## 2017-06-28 ENCOUNTER — Telehealth: Payer: Self-pay

## 2017-06-28 NOTE — Telephone Encounter (Signed)
Telephone call to pt as she NOS [facility] appointment.  Pt states that her probation visits are Tuesdays and Thurdays.  Her visits with her children are on Tuesdays and THursdays.  She is obviously having trouble making all obligations.  Pt states that her probation officer is trying to switch her visits with children to a different day.  Pt was unable to schedule appointment for 06/30/17, needed to switch previously scheduled U/S  To different time .  She picks her daughter up at exact time U/S is scheduled.    U/S was rescheduled for 2:00pm on 07/05/17.  SCC appointment needed to be prior to U/S ( no available ones after U/S).   Pt scheduled with [facility] NP at 1:30pm.  Pt asked to arrive a few minutes early.  Pt voiced good understanding by repeating times back to  Writer.

## 2017-07-01 DIAGNOSIS — O4440 Low lying placenta NOS or without hemorrhage, unspecified trimester: Secondary | ICD-10-CM | POA: Insufficient documentation

## 2017-07-04 ENCOUNTER — Other Ambulatory Visit: Payer: Self-pay | Admitting: Obstetrics

## 2017-07-04 DIAGNOSIS — Z3480 Encounter for supervision of other normal pregnancy, unspecified trimester: Secondary | ICD-10-CM

## 2017-07-05 ENCOUNTER — Telehealth: Payer: Self-pay

## 2017-07-05 ENCOUNTER — Ambulatory Visit: Payer: Medicaid (Managed Care) | Attending: Obstetrics

## 2017-07-05 DIAGNOSIS — O30042 Twin pregnancy, dichorionic/diamniotic, second trimester: Secondary | ICD-10-CM | POA: Insufficient documentation

## 2017-07-05 DIAGNOSIS — O4402 Placenta previa specified as without hemorrhage, second trimester: Secondary | ICD-10-CM | POA: Insufficient documentation

## 2017-07-05 DIAGNOSIS — Z6841 Body Mass Index (BMI) 40.0 and over, adult: Secondary | ICD-10-CM | POA: Insufficient documentation

## 2017-07-05 DIAGNOSIS — Z3480 Encounter for supervision of other normal pregnancy, unspecified trimester: Secondary | ICD-10-CM | POA: Insufficient documentation

## 2017-07-05 DIAGNOSIS — O99332 Smoking (tobacco) complicating pregnancy, second trimester: Secondary | ICD-10-CM | POA: Insufficient documentation

## 2017-07-05 DIAGNOSIS — O10012 Pre-existing essential hypertension complicating pregnancy, second trimester: Secondary | ICD-10-CM | POA: Insufficient documentation

## 2017-07-05 DIAGNOSIS — O99212 Obesity complicating pregnancy, second trimester: Secondary | ICD-10-CM

## 2017-07-05 DIAGNOSIS — Z362 Encounter for other antenatal screening follow-up: Secondary | ICD-10-CM | POA: Insufficient documentation

## 2017-07-05 NOTE — Telephone Encounter (Signed)
[facility] - call placed to patient. She apologizes for missing OBC, states she had several other obligations today including a probation meeting. She agreed to resche[facility] to next [facility].

## 2017-07-12 ENCOUNTER — Telehealth: Payer: Self-pay

## 2017-07-12 NOTE — Telephone Encounter (Signed)
Telephone call to assist pt in rescheduling missed [facility] appointment.  Pt states she had probation today, " then had to go pick my son up."   Pt asks for an appointment in one week.  Writer asked if she would have probation again, and pt verifies that she will not.  Pt was given a 2:15pm appointment on 07/19/17, which is following a previously scheduled appointment in U/S at 1:00pm.  Pt is aware of both appointments

## 2017-07-18 ENCOUNTER — Other Ambulatory Visit: Payer: Self-pay | Admitting: Obstetrics

## 2017-07-18 DIAGNOSIS — Z3480 Encounter for supervision of other normal pregnancy, unspecified trimester: Secondary | ICD-10-CM

## 2017-07-19 ENCOUNTER — Ambulatory Visit: Payer: Medicaid (Managed Care) | Attending: Obstetrics and Gynecology | Admitting: Obstetrics and Gynecology

## 2017-07-19 ENCOUNTER — Telehealth: Payer: Self-pay | Admitting: Pulmonology

## 2017-07-19 ENCOUNTER — Ambulatory Visit: Payer: Medicaid (Managed Care)

## 2017-07-19 VITALS — BP 136/65 | HR 80 | Ht 65.0 in | Wt 306.0 lb

## 2017-07-19 DIAGNOSIS — O10012 Pre-existing essential hypertension complicating pregnancy, second trimester: Secondary | ICD-10-CM | POA: Insufficient documentation

## 2017-07-19 DIAGNOSIS — O99212 Obesity complicating pregnancy, second trimester: Secondary | ICD-10-CM

## 2017-07-19 DIAGNOSIS — O30042 Twin pregnancy, dichorionic/diamniotic, second trimester: Secondary | ICD-10-CM

## 2017-07-19 DIAGNOSIS — Z362 Encounter for other antenatal screening follow-up: Secondary | ICD-10-CM

## 2017-07-19 DIAGNOSIS — J4530 Mild persistent asthma, uncomplicated: Secondary | ICD-10-CM | POA: Insufficient documentation

## 2017-07-19 DIAGNOSIS — Z3480 Encounter for supervision of other normal pregnancy, unspecified trimester: Secondary | ICD-10-CM | POA: Insufficient documentation

## 2017-07-19 DIAGNOSIS — O0992 Supervision of high risk pregnancy, unspecified, second trimester: Secondary | ICD-10-CM | POA: Insufficient documentation

## 2017-07-19 DIAGNOSIS — Z3A20 20 weeks gestation of pregnancy: Secondary | ICD-10-CM

## 2017-07-19 DIAGNOSIS — O99332 Smoking (tobacco) complicating pregnancy, second trimester: Secondary | ICD-10-CM | POA: Insufficient documentation

## 2017-07-19 DIAGNOSIS — R0600 Dyspnea, unspecified: Secondary | ICD-10-CM

## 2017-07-19 LAB — POCT URINALYSIS DIPSTICK
Blood,UA POCT: NEGATIVE
Glucose,UA POCT: NORMAL mg/dL
Ketones,UA POCT: NEGATIVE mg/dL
Leuk Esterase,UA POCT: NEGATIVE
Lot #: 34762104
Nitrite,UA POCT: NEGATIVE
PH,UA POCT: 9 (ref 5–8)

## 2017-07-19 MED ORDER — BUDESONIDE-FORMOTEROL FUMARATE 160-4.5 MCG/ACT IN AERO *I*
2.0000 | INHALATION_SPRAY | Freq: Two times a day (BID) | RESPIRATORY_TRACT | 2 refills | Status: DC
Start: 2017-07-19 — End: 2017-09-21

## 2017-07-19 MED ORDER — NICOTINE POLACRILEX 2 MG MT GUM *I*
2.0000 mg | CHEWING_GUM | OROMUCOSAL | 2 refills | Status: DC | PRN
Start: 2017-07-19 — End: 2017-11-16

## 2017-07-19 MED ORDER — CLINDAMYCIN PHOSPHATE 1 % EX GEL *I*
Freq: Two times a day (BID) | CUTANEOUS | 2 refills | Status: DC
Start: 2017-07-19 — End: 2017-07-26

## 2017-07-19 NOTE — Telephone Encounter (Signed)
Jenna [name] is scheduled for a new patient visit.    Appointment scheduled with Provider: Ulis Rias.   Reason for appointment: Asthma exacerbation in pregnancy.  The appointment is scheduled for date: 09/21/17.     Please mail the new patient paperwork to address listed on file.  Patient can be reached if necessary at [phone].

## 2017-07-19 NOTE — Progress Notes (Addendum)
SPECIAL CARE CLINIC Prenatal Visit    SUBJECTIVE  Jenna [name] is a 32 y.o., [phone] female presenting for routine OBC at [redacted]w[redacted]d.  Denies contractions, leakage of fluid, or vaginal bleeding.  Endorses fetal movement.      With regard to her asthma, she reports increased albuterol use.  She reports worsening dyspnea on exertion while climbing stairs.  Her symptoms are daily.   She denies orthopnea.    With regard to tobacco use, she reports a decrease in cigarettes and smokes 1 cigarette every 5 days.  She requests nicotine gum.    With regard to UTI, she completed macrobid therapy.  She reports intermittent urinary frequency and urgency.  She left urine sample today with negative nitrites and negative leuk esterase.  Plan to follow up culture.     With regard to chronic hypertension, she has not been taking metoprolol due to side effects. BP is normal today.    Had normal ultrasound today.  Plan for monthly growth ultrasounds.      Planning fifth repeat cesarean delivery.  She desires BTL at cesarean.     Pregnancy complicated by:   Patient Active Problem List   Diagnosis Code    Tobacco abuse Z72.0    Obesity, Class III, BMI 49.1 (morbid obesity) E66.01    Hx Cocaine abuse F14.11    Chronic hypertension in pregnancy O10.919    Asthma J45.909    Depression F32.9    Family history of SIDS (sudden infant death syndrome) Z84.82    Migraines G43.909    Supervision of high-risk pregnancy O09.90    History of cesarean delivery, T incision with 4th c/s O34.219    Dichorionic diamniotic twin gestation O10.049    Low-lying placenta O44.40     Problem list, vitals, family & social history, medications, and allergies were reviewed and updated as appropriate today.      OBJECTIVE  Vitals:    07/19/17 1407   BP: 136/65   Pulse: 80   Weight: (!) 138.8 kg (306 lb)   Height: 1.651 m ( )          O2 Saturation 100% on room air      Urine POCT:  Protein: (!) + /dL  Glucose: Normal       Exam:            General  appearance - alert, well appearing, and in no distress  Chest - clear to auscultation, no wheezes, rales or rhonchi, symmetric air entry  Heart - normal rate, regular rhythm, normal S1, S2, no murmurs, rubs, clicks or gallops  Abdomen - soft, nontender, nondistended, no masses or organomegaly  Extremities - peripheral pulses normal, no pedal edema, no clubbing or cyanosis      ASSESSMENT / PLAN  32 y.o., A5W0981 at [redacted]w[redacted]d with high-risk pregnancy.     Orders Placed This Encounter   Procedures    Aerobic culture     Standing Status:   Future     Standing Expiration Date:   07/19/2018    CBC     Standing Status:   Future     Standing Expiration Date:   08/18/2018    NT-pro BNP     Standing Status:   Future     Standing Expiration Date:   08/18/2018    Comprehensive metabolic panel     Standing Status:   Future     Standing Expiration Date:   08/18/2018    Protein, urine  Standing Status:   Future     Standing Expiration Date:   07/19/2018     Order Specific Question:   Collection type     Answer:   RANDOM    Creatinine, urine     Standing Status:   Future     Standing Expiration Date:   07/20/2018     Order Specific Question:   Collection type     Answer:   RANDOM    AMB REFERRAL TO PULMONOLOGY     Referral Priority:   Routine     Referral Type:   Referral     Referral Reason:   Consult and treat     Requested Specialty:   Pulmonology     Number of Visits Requested:   1    POCT urinalysis dipstick    Echo Complete     Standing Status:   Future     Standing Expiration Date:   07/19/2018     Order Specific Question:   Cardiac indication     Answer:   Hypertension Unspecified     Order Specific Question:   Site preference     Answer:   [facility]     Order Specific Question:   ECHO contrast will be administered if needed for image quality, per protocol.     Answer:   Yes     Order Specific Question:   Should the beta blocker be held?     Answer:   No    PULMONARY FUNCTION TEST Complete PFT's (Includes spirometry, SVC, MVV, DLCO,  FRC)     Standing Status:   Future     Standing Expiration Date:   08/20/2018     Order Specific Question:   Which test is being ordered?     Answer:   Complete PFT's (Includes spirometry, SVC, MVV, DLCO, FRC)     Today's Plan:    Dichorionic diamniotic twin gestation   - Normal ultrasound today, monthly ultrasounds ordered  - Delivery planning: repeat LTCS  - PPBC: DSS signed today for BTL at CS    Chronic hypertension  - Not taking metoprolol  - Normal BP today  - Encouraged completion of 24 hour urine    Dyspnea on exertion  - Likely related to asthma  - O2 Sat 100% on room air  - CBC, CMP, BNP  - Maternal echo ordered    Asthma, mild persistent  - Add Symbicort 2 puffs twice daily  - Pulmonary function testing and pulmonology referral ordered  - O2 Sat 100% on room air    UTI  - 06/07/2017 Enterococcus faecalis  - Completed macrobid therapy.  She reports intermittent urinary frequency and urgency.    - Urine culture with negative nitrites and negative leuk esterase.    - Plan to follow up culture.      Follow-up:   Return to the office in 3 weeks for [facility].  Ultrasound in 1 month.    Nyra Capes, MD  07/19/2017  3:27 PM     _________________________  MFM Fellow Addendum    Patient is a 32 y.o. female Z6X0960 at [redacted]w[redacted]d. Please see below for pregnancy associated issues and plans of care.     Di/Di twin gestation:  --[name] today with 2% discordance; placentas posterior x 2  --Serial [name] for growth; next due at [redacted] weeks GA  --Delivery at 38 weeks or earlier for maternal or fetal complications    Dyspnea on exertion  --History of asthma --uses albuterol  inhaler multiple times during the day.   --Will start Symbicort. Pulmonology referral and PFT orders placed in EPIC    Oxygen saturation normal on exam, no tachypnea. Urgent evaluation not necessary. Out patient evaluation will include the following:    --EKG  --Maternal echo   --NT-Pro-BNP  --CBC, CMP, UPC  --Strict return precautions reviewed    CHTN:   --Previously on  Metoprolol --does not take currently secondary to HA (BP today 136/65)  --Baseline labs, UPC ordered today; encouraged patient to obtain 24 hr UTP    Hx of LTCS x4  --T incision with prior pregnancy (EBL 2L)  --BTL DSS signed today     Enterococcus UTI during pregnancy  --s/p treatment but with symptoms of dysuria today   --Urinalysis with negative nitrites, negative bacteria. Will send for culture     PSA  --Completed substance use recovery program--clean 1 year    Depression/ Anxiety/ PTSD  --Depression with hx of suicidal ideation (inpt for month of sept 2017 for worsening depression)   --2nd child died of SIDs @ 9mo old   --PTSD from cesarean section experience (last pregnancy)      Social Circumstances  --Enrolled with CPS, does not have custody of all children   --SW to see patient today.    Juventino Slovak, MD  Maternal Fetal Medicine

## 2017-07-20 ENCOUNTER — Telehealth: Payer: Self-pay

## 2017-07-20 LAB — RESOLUTION

## 2017-07-20 LAB — AEROBIC CULTURE: Aerobic Culture: 0

## 2017-07-20 LAB — CREATININE, URINE: Creatinine,UR: 76 mg/dL (ref 20–300)

## 2017-07-20 NOTE — Telephone Encounter (Signed)
Jenna [name] calling form [facility] lab and informed that the urine sample that was obtained they were not able to process due to sample being a "random urine test", Jenna [name] requesting new order to be placed for a "24 hour urine sample".

## 2017-07-20 NOTE — Progress Notes (Addendum)
Social Work Note  F/U visit with Jenna [name] to obtain a ROI for her Stage manager.  Jenna [name] was initially quite reluctant to sign this release as she questioned the purpose.  Explained that since she has an active CPS case and children out of her care that SW needs to collaborate with CPS re: the d/c plan for these babies.  Jenna [name] stated that CPS is helping to prepare for her twins and that CPS worker should be able to state that d/c plan is to her and [name] Jenna [name].  Jenna [name] spoke with [name] via phone about signing the release, was concerned that SW had an ulterior motive, but then stated that she had nothing to hide and opted to sign the ROI for CPS.  Jenna [name] shared that she has 14yo son back and that he has behavioral and mental health issues.  Jenna [name] also stated that starting next week she will have unsupervised visits with her 32yo.    Jenna [name] is finding it challenging to make it to [facility] appts.  Jenna [name] currently has supervised visits with her 32yo on Thursdays from 9-4.  On Tuesdays she has to report to Probation (weekly) and then get home by the time her son is out of school (2:30).  Jenna [name] has been taking the bus and finding she does not have enough time to get to 2 appts on the same day.  Jenna [name] is also experiencing SOB along with her asthma condition. Jenna [name] is requesting cab transportation due to this medical issue.   Plan:  ROI signed for CPS worker Jenna [name] [phone], message left for Jenna [name].             MAS form signed by Dr. Gershon Crane and faxed.  Jenna [name] is aware of contact number for MAS and will call next week to check status of form.             SW to f/u with Jenna [name] at her next [facility].  Loletta Specter, LMSW    5/8- Received call from CPS worker Jenna [name] who report that the plan is for infant's to be d/c'd home to Jenna [name] unless she were to relapse and test positive for any illicit drugs.    Loletta Specter, [place]  [phone]

## 2017-07-20 NOTE — Telephone Encounter (Signed)
Writer spoke with Fayrene Fearing [phone]) from Specimen management. Urine sample was received for urine culture on random urine.  Creatinine clearance was also ordered   Creatinine clearance can only be run on 24 hour sample.  Writer spoke with Dr Lanny Hurst notified that urine creatinine was the desired test.  Fayrene Fearing voiced understanding, test will be run.

## 2017-07-20 NOTE — Telephone Encounter (Signed)
Please schedule pt with [facility] SW at her next OBC.  Theora Vankirk, LMSW  [phone]

## 2017-07-20 NOTE — Telephone Encounter (Signed)
Pt is calling and would like to have a RN call her to give her lab/ USN results.

## 2017-07-20 NOTE — Telephone Encounter (Signed)
Pt  would like to know if [facility] has any positive drug screens.  With chart review, pt was informed the only drug screen completed by [facility] THIS YEAR, was negative ( 06/07/17).   Pt states that yesterday, doctor was talking about CPS being involved, and her past drug use.  She wanted to make sure that " you guys did not have a positive screen, because I dont use."  Writer congratulated her on no drug use, and again stated negative screen in 05/2017.  Writer did not discuss past UCDS results, (from 2018) only this pregnancy,

## 2017-07-22 ENCOUNTER — Other Ambulatory Visit
Admission: RE | Admit: 2017-07-22 | Discharge: 2017-07-22 | Disposition: A | Discharge status: Home / Self Care | Payer: Medicaid (Managed Care) | Source: Ambulatory Visit | Attending: Obstetrics and Gynecology | Admitting: Obstetrics and Gynecology

## 2017-07-22 ENCOUNTER — Inpatient Hospital Stay: Admit: 2017-07-22 | Payer: Medicaid (Managed Care)

## 2017-07-22 DIAGNOSIS — R0600 Dyspnea, unspecified: Secondary | ICD-10-CM | POA: Insufficient documentation

## 2017-07-22 DIAGNOSIS — O0992 Supervision of high risk pregnancy, unspecified, second trimester: Secondary | ICD-10-CM | POA: Insufficient documentation

## 2017-07-22 DIAGNOSIS — Z3A14 14 weeks gestation of pregnancy: Secondary | ICD-10-CM | POA: Insufficient documentation

## 2017-07-22 LAB — COMPREHENSIVE METABOLIC PANEL
ALT: 6 U/L (ref 0–35)
AST: 11 U/L (ref 0–35)
Albumin: 4.3 g/dL (ref 3.5–5.2)
Alk Phos: 92 U/L (ref 35–105)
Anion Gap: 15 (ref 7–16)
Bilirubin,Total: <0.2 mg/dL (ref 0.0–1.2)
CO2: 23 mmol/L (ref 20–28)
Calcium: 9.5 mg/dL (ref 8.8–10.2)
Chloride: 101 mmol/L (ref 96–108)
Creatinine: 0.6 mg/dL (ref 0.51–0.95)
GFR,Black: 140 *
GFR,Caucasian: 121 *
Glucose: 77 mg/dL (ref 60–99)
Lab: 10 mg/dL (ref 6–20)
Potassium: 4.3 mmol/L (ref 3.3–5.1)
Sodium: 139 mmol/L (ref 133–145)
Total Protein: 7 g/dL (ref 6.3–7.7)

## 2017-07-22 LAB — UNMAPPED LAB RESULTS
ABO RH Blood Type (HT): A POS — NL
Antibody Screen (HT): NEGATIVE — NL

## 2017-07-22 LAB — NT-PRO BNP: NT-pro BNP: <50 pg/mL (ref 0–450)

## 2017-07-22 LAB — MULTIPLE ORDERING DOCS

## 2017-07-22 LAB — RETICULOCYTES
Retic %: 1.8 % (ref 0.7–2.1)
Retic Abs: 70.3 10*3/uL (ref 29.9–90.9)

## 2017-07-22 LAB — TYPE AND SCREEN FOR PNP
ABO RH Blood Type: A POS
Antibody Screen: NEGATIVE

## 2017-07-23 LAB — PRENATAL PROFILE
Baso # K/uL: 0 10*3/uL (ref 0.0–0.1)
Basophil %: 0.3 %
Eos # K/uL: 0.1 10*3/uL (ref 0.0–0.4)
Eosinophil %: 0.4 %
HBV S Ag: NEGATIVE
Hematocrit: 34 % (ref 34–45)
Hemoglobin: 10.5 g/dL — ABNORMAL LOW (ref 11.2–15.7)
IMM Granulocytes #: 0.1 10*3/uL
IMM Granulocytes: 0.4 %
Lymph # K/uL: 2 10*3/uL (ref 1.2–3.7)
Lymphocyte %: 16.7 %
MCH: 28 pg/cell (ref 26–32)
MCHC: 31 g/dL — ABNORMAL LOW (ref 32–36)
MCV: 88 fL (ref 79–95)
Mono # K/uL: 0.8 10*3/uL (ref 0.2–0.9)
Monocyte %: 6.6 %
Neut # K/uL: 9.1 10*3/uL — ABNORMAL HIGH (ref 1.6–6.1)
Nucl RBC # K/uL: 0 10*3/uL (ref 0.0–0.0)
Nucl RBC %: 0 /100 WBC (ref 0.0–0.2)
Platelets: 336 10*3/uL (ref 160–370)
RBC: 3.8 MIL/uL — ABNORMAL LOW (ref 3.9–5.2)
RDW: 18.7 % — ABNORMAL HIGH (ref 11.7–14.4)
Rubella IgG AB: POSITIVE
Seg Neut %: 75.6 %
Syphilis Screen: NEGATIVE
Syphilis Status: NONREACTIVE
WBC: 12 10*3/uL — ABNORMAL HIGH (ref 4.0–10.0)

## 2017-07-23 LAB — UNMAPPED LAB RESULTS
HIV 1&2 ANTIGEN/ANTIBODY (HT): NONREACTIVE — NL
Hep C Ab: NEGATIVE — NL

## 2017-07-23 LAB — HIV 1&2 ANTIGEN/ANTIBODY: HIV 1&2 ANTIGEN/ANTIBODY: NONREACTIVE

## 2017-07-23 LAB — HEMOGLOBIN A1C: Hemoglobin A1C: 5.2 %

## 2017-07-23 LAB — HEPATITIS C ANTIBODY: Hep C Ab: NEGATIVE

## 2017-07-26 ENCOUNTER — Telehealth: Payer: Self-pay

## 2017-07-26 ENCOUNTER — Other Ambulatory Visit: Payer: Self-pay | Admitting: Obstetrics and Gynecology

## 2017-07-26 DIAGNOSIS — L732 Hidradenitis suppurativa: Secondary | ICD-10-CM

## 2017-07-26 LAB — LEAD VENOUS: Lead,Venous: <1 ug/dl (ref 0–5)

## 2017-07-26 LAB — LEAD, BLOOD

## 2017-07-26 MED ORDER — CHLORHEXIDINE GLUCONATE 4 % EX LIQD *WRAPPED*
Freq: Every day | CUTANEOUS | 1 refills | Status: DC
Start: 2017-07-26 — End: 2017-11-16

## 2017-07-26 MED ORDER — CLINDAMYCIN PHOSPHATE 1 % EX LOTN *I*
TOPICAL_LOTION | Freq: Two times a day (BID) | CUTANEOUS | 0 refills | Status: DC
Start: 2017-07-26 — End: 2017-11-16

## 2017-07-26 NOTE — Telephone Encounter (Signed)
Pt states that she has Hydradenitis suppurativa, and is having " a really bad flare up."  Started about 5 days ago , started with cysts under her arm, they are draining.  Now " cysts are everywhere"  Behind her knees, in the crease of her legs, and near her vagina.  She is in extreme pain and " can barely move".  Pt states provider from last week told her that she would send a prescription for antibiotic , but did not.  Will forward to Dr Donald Prose

## 2017-07-26 NOTE — Telephone Encounter (Signed)
Received the following message from Dr Donald Prose:    spoke with some of our attendings here.  If the clindamycin isn't working for her, we suggest that she be seen by one of the GYNs for a non [facility] visit who are good at evaluating and treating hydradenitis ([facility] or Luana Shu).     Dr. Luana Shu is supervising resident clinic on 08/05/2017.  Can the patient be scheduled with a resident for that day for a special Hydradenitis eval and treat visit?     In the meantime, I recommend warm compresses. Can also do chlorhexidine wash (previously prescribed).       Pt  Was notified, she states she can not afford the chlorhexidine,  Writer agrees to involve SW.  Pt accepts an appointment on 08/05/17 at 11:00 with resident .   Writer used the 2 day freeze appointments.     Pt made aware that she would have to utilize [facility] outpatient pharmacy if she were to use SW's assistance in obtaining chlorhexidine.  Pt voiced understanding.  Writer agrees to return call to pt after discussion with SW.    Also pt needs to be told that Dr Donald Prose is going to put in new referral to Plastic Surgery

## 2017-07-26 NOTE — Telephone Encounter (Signed)
Patient called stating clindamycin (CLINDAGEL) 1 % gel is too strong, and feels like "putting alcohol on an open wound".  She is requesting a different prescription.    She can be reached at [phone]

## 2017-07-26 NOTE — Progress Notes (Signed)
[facility] SOCIAL WORK  PHARMACY FORM     Todays date:  Jul 26, 2017    Patient Name: Jenna [name]      Medical Record #: [id number]   DOB: 24-May-1985  Patients Address: [address]                Social Worker: Raelyn Number, LMSW       Date of Service: Jul 26, 2017       Funding Source: SW Medication Assistance Fund  ___________________________________________________________________    Pharmacy Information:  Date/time sent: Jul 26, 2017     Time needed: ASAP    Patient Location: Outpatient    Medication Pick-up Preference: Patient will pick up at the pharmacy    Pharmacy Contact: Ellard Artis  Social work signature: Raelyn Number, LMSW  Supervisor/Manager Approval (if indicated):  Date:07/26/17  (supervisor signature not required for Medicaid pending)

## 2017-07-26 NOTE — Telephone Encounter (Signed)
Left message to schedule NPV, SEE REFERRAL WORKQUEUE

## 2017-07-26 NOTE — Telephone Encounter (Signed)
Pt made aware that SW used a voucher for the prescription at [facility] and she could go to pick it up.  Pt voiced understanding.  Pt also made aware that a new referral to Plastic Surgery was being sent.

## 2017-07-26 NOTE — Telephone Encounter (Signed)
Per K. Hober, will send prescription to [facility]

## 2017-07-26 NOTE — Addendum Note (Signed)
Addended by: Nyra Capes on: 07/26/2017 02:07 PM     Modules accepted: Orders

## 2017-07-26 NOTE — Telephone Encounter (Signed)
Pt called In stating she has boils in the creases of her legs and on top of her vagina. Pt states she cannot move and is currently bed bound. Pt states pain 8 /10 , Requesting a call back from a nurse

## 2017-07-27 NOTE — Telephone Encounter (Signed)
2ND ATTEMPT

## 2017-07-29 ENCOUNTER — Other Ambulatory Visit: Payer: Self-pay | Admitting: Obstetrics

## 2017-07-29 DIAGNOSIS — Z3480 Encounter for supervision of other normal pregnancy, unspecified trimester: Secondary | ICD-10-CM

## 2017-08-05 ENCOUNTER — Encounter: Payer: Medicaid (Managed Care) | Admitting: Obstetrics and Gynecology

## 2017-08-09 ENCOUNTER — Telehealth: Payer: Self-pay

## 2017-08-09 ENCOUNTER — Other Ambulatory Visit: Payer: Medicaid (Managed Care)

## 2017-08-09 NOTE — Telephone Encounter (Signed)
PT calling to reschedule her appointment and her ultrasound because she isn't going to be able to make it.

## 2017-08-11 NOTE — Telephone Encounter (Signed)
Patient called stating [employer] will not pick her up for her [facility] OB check appointment today.    She wants to reschedule the appointment to 6/4, to coordinate w/ her scheduled USN appointment.    Please call patient at [phone]

## 2017-08-16 ENCOUNTER — Telehealth: Payer: Self-pay

## 2017-08-16 ENCOUNTER — Other Ambulatory Visit: Payer: Medicaid (Managed Care)

## 2017-08-16 NOTE — Telephone Encounter (Signed)
PT calling requesting to speak to [facility], wanted to know if she can get an emergency [employer] to pick her up for her appointment because she doesn't have a way here.

## 2017-08-16 NOTE — Telephone Encounter (Signed)
PT is now calling stating she needs her appointment for today rescheduled.

## 2017-08-19 ENCOUNTER — Other Ambulatory Visit: Payer: Medicaid (Managed Care)

## 2017-08-19 ENCOUNTER — Ambulatory Visit: Payer: Medicaid (Managed Care) | Admitting: Surgery

## 2017-08-23 ENCOUNTER — Telehealth: Payer: Self-pay

## 2017-08-23 NOTE — Telephone Encounter (Signed)
PT called to reschedule her appointment for today. She states she spoke with kim Hober about transportation and the cab never showed up. I rescheduled her appointment for this Thursday and was hoping to get a cab for her visit. Writer said she would send a message to SW to see what can be done about transportation.

## 2017-08-24 NOTE — Telephone Encounter (Signed)
Writer called to set up transportation for pt. Transportation set up with [employer] pick up time at 1:00pm conformation number [phone]. Writer called pt to inform of pick up time. PT okay with time.

## 2017-08-25 ENCOUNTER — Ambulatory Visit: Payer: Medicaid (Managed Care) | Attending: Obstetrics and Gynecology | Admitting: Obstetrics and Gynecology

## 2017-08-25 ENCOUNTER — Other Ambulatory Visit: Payer: Self-pay | Admitting: Obstetrics

## 2017-08-25 ENCOUNTER — Ambulatory Visit: Payer: Medicaid (Managed Care) | Attending: Obstetrics

## 2017-08-25 VITALS — BP 132/80 | HR 97 | Ht 65.0 in | Wt 297.5 lb

## 2017-08-25 DIAGNOSIS — O30042 Twin pregnancy, dichorionic/diamniotic, second trimester: Secondary | ICD-10-CM | POA: Insufficient documentation

## 2017-08-25 DIAGNOSIS — Z3480 Encounter for supervision of other normal pregnancy, unspecified trimester: Secondary | ICD-10-CM | POA: Insufficient documentation

## 2017-08-25 DIAGNOSIS — Z6841 Body Mass Index (BMI) 40.0 and over, adult: Secondary | ICD-10-CM | POA: Insufficient documentation

## 2017-08-25 DIAGNOSIS — O10012 Pre-existing essential hypertension complicating pregnancy, second trimester: Secondary | ICD-10-CM | POA: Insufficient documentation

## 2017-08-25 DIAGNOSIS — O0992 Supervision of high risk pregnancy, unspecified, second trimester: Secondary | ICD-10-CM

## 2017-08-25 DIAGNOSIS — Z3A25 25 weeks gestation of pregnancy: Secondary | ICD-10-CM

## 2017-08-25 DIAGNOSIS — O99212 Obesity complicating pregnancy, second trimester: Secondary | ICD-10-CM

## 2017-08-25 DIAGNOSIS — Z362 Encounter for other antenatal screening follow-up: Secondary | ICD-10-CM

## 2017-08-25 DIAGNOSIS — O10919 Unspecified pre-existing hypertension complicating pregnancy, unspecified trimester: Secondary | ICD-10-CM

## 2017-08-25 DIAGNOSIS — O99332 Smoking (tobacco) complicating pregnancy, second trimester: Secondary | ICD-10-CM | POA: Insufficient documentation

## 2017-08-25 DIAGNOSIS — O99891 Other specified diseases and conditions complicating pregnancy: Secondary | ICD-10-CM

## 2017-08-25 DIAGNOSIS — M549 Dorsalgia, unspecified: Secondary | ICD-10-CM

## 2017-08-25 DIAGNOSIS — O9989 Other specified diseases and conditions complicating pregnancy, childbirth and the puerperium: Secondary | ICD-10-CM

## 2017-08-25 MED ORDER — COMFORT FIT MATERNITY SUPP LG MISC
1.0000 | Freq: Every day | 0 refills | Status: DC | PRN
Start: 2017-08-25 — End: 2017-11-16

## 2017-08-25 NOTE — Progress Notes (Signed)
I saw and evaluated the patient.  Details of my evaluation are as follows:     32 y.o. yo Z6X0960 @ [redacted]w[redacted]d wks ega.    Pregnancy complicated by:   Patient Active Problem List   Diagnosis Code    Tobacco abuse Z72.0    Obesity, Class III, BMI 49.1 (morbid obesity) E66.01    Hx Cocaine abuse F14.11    Chronic hypertension in pregnancy O10.919    Asthma J45.909    Depression F32.9    Family history of SIDS (sudden infant death syndrome) Z84.82    Migraines G43.909    Supervision of high-risk pregnancy O09.90    History of cesarean delivery, T incision with 4th c/s O34.219    Dichorionic diamniotic twin gestation O30.049     Problem list, vitals, family & social history, medications, and allergies were reviewed and updated as appropriate today.  Pt states, "I just want to leave, I'm tired. I already had my ultrasound and it's fine"    Pt asks if she can be put on bedrest because she is tired and her back hurts.    Pt has her 50 year old son and 68 year old daughter with her today. She is hoping to have custody of her daughter by 6/18.  Pt frequently directed her son to pay attention to her daughter, he was very kind and loving toward his sister.  States that her son will be helping with the twins when they are born.    Contractions: No  Loss of fluid: No  Vaginal bleeding: No    OBJECTIVE  Vitals:    08/25/17 1550   BP: 132/80   Pulse:    Weight:    Height:      Children are both clean, well dressed.   Son is engaging in conversation, daughter is smiling.    EXAM:   LE exam: none    Physical Exam: pt is smiling and talkative. Moves around exam room without difficulty.    ORDERED TESTING:     Orders Placed This Encounter   Procedures    Drug screen panel, urine     Standing Status:   Future     Standing Expiration Date:   09/24/2017     Order Specific Question:   Drug confirmations     Answer:   YES    CBC     Standing Status:   Future     Standing Expiration Date:   08/26/2018    Glucose tolerance, 1 hour      Standing Status:   Future     Standing Expiration Date:   08/26/2018    Syphilis Screen w Rfx to RPR and TPPA     Standing Status:   Future     Standing Expiration Date:   08/26/2018    POCT urinalysis dipstick       ASSESSMENT/PLAN  32 y.o. A5W0981 at [redacted]w[redacted]d with high-risk pregnancy.    Problems updated, addressed and changed today include:   Problem   Supervision of High-Risk Pregnancy    EDC based on LMP c/w 10w [name]  A+/RI/HIV neg  No screening tests  CF screen neg, hgbE WNL    Anatomic: no gross defects but views of some anatomy suboptimal    Genders:  Feeding:  PPBC: BTL (DSS consent under Media)  Peds:         Chronic Hypertension in Pregnancy    h/o Preeclampsia with prior pregnancy  Chronic HTN based on review of  chart, multiple elevated BP outside and inside of pregnancy    Baseline:  HELLP labs - WNL  24hr urine or Urine spot - need to let patient know    No medications  Baby ASA ordered       Discussed that bedrest is not indicated and may lead to pregnancy complications (eg. Blood clots, deconditioning of muscles)  Prescription for pregnancy support belt and list of medical supply shops given to pt today.  Jeni SallesKim Hober, MSW in to see pt.  Discussed glucola, pt agrees to have testing at her next visit  Monthly ultrasound for growth.  Discussed pt with Dr. Sharrie Rothmanrennan, will plan 37 week repeat c/s given history of cHTN and "T" incision for last c/s  Return to clinic in 2 weeks.    Dierdre HighmanKATHRYN Nalu [place], NP  Maternal Fetal Medicine

## 2017-08-30 ENCOUNTER — Telehealth: Payer: Self-pay

## 2017-08-30 NOTE — Telephone Encounter (Signed)
Message left asking pt to contact [facility] nurse.

## 2017-08-30 NOTE — Telephone Encounter (Signed)
PT calling [facility] to [facility] to ask a question.

## 2017-09-01 NOTE — Telephone Encounter (Signed)
Call returned to patient -she wonders when she can complete the Glucose Tolerance test. Reassured patient the test is ordered and she can complete it at any [facility] lab at this time. She states she plans to complete it 09/08/17 when she is in office next.

## 2017-09-13 ENCOUNTER — Telehealth: Payer: Self-pay

## 2017-09-13 NOTE — Progress Notes (Signed)
Social Work Note  Received a message from CPS worker Jed Limerickyneisha Anderson [phone] who inquired about if Ms. Jenna [name] was recently drug tested.  Pt has not had a UCDS since her initial testing which was negative for all substances.    Plan:  SW returned call to CPS worker relaying above info.    Jenna [name], [place]  [phone]

## 2017-09-13 NOTE — Telephone Encounter (Signed)
[facility] pt calling to reschedule her missed appointment from today 7.2.19

## 2017-09-13 NOTE — Telephone Encounter (Signed)
[facility] - call placed to patient and she was agreeable to reschedule to next week.

## 2017-09-19 ENCOUNTER — Telehealth: Payer: Self-pay | Admitting: Internal Medicine

## 2017-09-19 NOTE — Telephone Encounter (Signed)
Patient is requesting transportation to their upcoming appointment on 09/21/2017 at 2 at Pulmonary      Pick up patient at (verified) home address? yes   If no, patient's pick up address is     Special accommodations needed? [employer]     Preferred transportation company?  [employer]    Patient contact number [phone]

## 2017-09-19 NOTE — Telephone Encounter (Signed)
TRANSPORTATION   ARRANGEMENTS:    Appointment Day:  7/10          Appointment Time:  2:00pm  Pick-up time: 1:00pm  Pick-up location  [address]  [place] [id number]       Cab Company is  [employer]                Transportation arranged on 09/19/2017  Transportation is round trip  Any riders- 1  Gamalielnvoice- [phone]

## 2017-09-20 ENCOUNTER — Encounter: Payer: Self-pay | Admitting: Obstetrics and Gynecology

## 2017-09-20 ENCOUNTER — Telehealth: Payer: Self-pay | Admitting: Internal Medicine

## 2017-09-20 NOTE — Telephone Encounter (Signed)
TRANSPORTATION   ARRANGEMENTS:    Appointment Day:  09/21/17          Appointment Time:  Unknown   Pick-up time: Will call after cardiology appt  Pick-up location  [address]       [employer] is  [employer]                 Transportation arranged on 09/20/2017  Transportation is round trip  Any riders- 1  Invoice- [phone]     Pt [address] be [address] and [facility], ride will take her home after  Pt agreed to walk from [address] to [address]

## 2017-09-20 NOTE — Telephone Encounter (Signed)
Patient was checking status of transportation of her 7/10 cardiology appointment.    Patient is requesting transportation to their upcoming appointment on 7/10 at 3:00 pm [facility]  Arrive at: [address]: Upon entering the office complex, turn right just past [address], follow driveway around to back of complex to [address]      Pick up patient at (verified) home address? no   If no, patient's pick up address is  [facility], [address], [place], [place] [id number].      Special accommodations needed? +1     Preferred transportation company?  [employer] [place]     Patient contact number [phone]      Writer advised patient confirmed her transportation to [address],  the cab will taking patient  To [address], then home, needs modification on current transportation order.

## 2017-09-20 NOTE — Telephone Encounter (Signed)
Please see encounter from 7/8

## 2017-09-21 ENCOUNTER — Encounter: Payer: Self-pay | Admitting: Pulmonology

## 2017-09-21 ENCOUNTER — Ambulatory Visit: Payer: Medicaid (Managed Care) | Attending: Pulmonology | Admitting: Pulmonology

## 2017-09-21 ENCOUNTER — Telehealth: Payer: Self-pay

## 2017-09-21 ENCOUNTER — Ambulatory Visit
Admission: RE | Admit: 2017-09-21 | Discharge: 2017-09-21 | Disposition: A | Discharge status: Home / Self Care | Payer: Medicaid (Managed Care) | Source: Ambulatory Visit | Attending: Cardiology | Admitting: Cardiology

## 2017-09-21 VITALS — BP 134/74 | HR 86 | Ht 65.0 in | Wt 300.0 lb

## 2017-09-21 DIAGNOSIS — J4550 Severe persistent asthma, uncomplicated: Secondary | ICD-10-CM

## 2017-09-21 DIAGNOSIS — R0600 Dyspnea, unspecified: Secondary | ICD-10-CM

## 2017-09-21 LAB — ECHO COMPLETE
Aortic Arch Diameter: 2.8 cm
Aortic Diameter (mid tubular): 2.5 cm
Aortic Diameter (sinus of Valsalva): 2.6 cm
BMI: 50.7 kg/m2
BP Diastolic: 74 mmHg
BP Systolic: 134 mmHg
BSA: 2.51 m2
Deceleration Time - MV: 181 ms
E/A ratio: 0.9
Echo RV Stroke Work Index Estimate: 532.9 mmHg•mL/m2
Heart Rate: 98 {beats}/min
Height: 65 in
LA Diameter BSA Index: 1.4 cm/m2
LA Diameter Height Index: 2.1 cm/m
LA Diameter: 3.4 cm
LA Systolic Vol BSA Index: 24.5 mL/m2
LA Systolic Vol Height Index: 37.3 mL/m
LA Systolic Volume: 61.5 mL
LV ASE Mass BSA Index: 93.1 gm/m2
LV ASE Mass Height 2.7 Index: 60.4 gm/m2.7
LV ASE Mass Height Index: 141.6 gm/m
LV ASE Mass: 233.7 gm
LV CO BSA Index: 2.09 L/min/m2
LV Cardiac Output: 5.24 L/min
LV Diastolic Volume Index: 31.8 mL/m2
LV Posterior Wall Thickness: 1.1 cm
LV SV - LVOT SV Diff: -29.92 mL
LV SV BSA Index: 21.3 mL/m2
LV SV Height Index: 32.4 mL/m
LV Septal Thickness: 1.3 cm
LV Stroke Volume: 53.5 mL
LV Systolic Volume Index: 10.4 mL/m2
LVED Diameter BSA Index: 2 cm/m2
LVED Diameter Height Index: 3 cm/m
LVED Diameter: 5 cm
LVED Volume BSA Index: 31.8 mL/m2
LVED Volume BSA Index: 32 ml/m2
LVED Volume Height Index: 48.3 mL/m
LVED Volume: 79.7 mL
LVEF (Volume): 67 %
LVES Diameter BSA Index: 1.3 cm/m2
LVES Diameter Height Index: 1.9 cm/m
LVES Diameter: 3.2 cm
LVES Volume BSA Index: 10 ml/m2
LVES Volume BSA Index: 10.4 mL/m2
LVES Volume Height Index: 15.9 mL/m
LVES Volume: 26.2 mL
LVOT Area (calculated): 3.36 cm2
LVOT Cardiac Index: 3.26 L/min/m2
LVOT Cardiac Output: 8.18 L/min
LVOT Diameter: 2.07 cm
LVOT PWD VTI: 24.8 cm
LVOT PWD Velocity (mean): 95.6 cm/s
LVOT PWD Velocity (peak): 140 cm/s
LVOT SV BSA Index: 33.23 mL/m2
LVOT SV Height Index: 50.5 mL/m
LVOT Stroke Rate (mean): 321.6 mL/s
LVOT Stroke Rate (peak): 470.9 mL/s
LVOT Stroke Volume: 83.42 cc
MR Regurgitant Fraction (LV SV Mtd): -0.56
MR Regurgitant Volume (LV SV Mtd): -29.9 mL
MV Peak A Velocity: 89.3 cm/s
MV Peak E Velocity: 80 cm/s
Mitral Annular E/Ea Vel Ratio: 11.64
Mitral Annular Ea Velocity: 6.87 cm/s
Peak Gradient - TR: 25 mmHg
Peak Velocity - TR: 279.3 cm/s
Pulmonary Vascular Resistance Estimate: 4.8 mmHg
RA Diameter (Medial-Lateral) BSA Index: 1.6 cm/m2
RA Diameter (Medial-Lateral) Height Index: 2.4 cm/m
RA Diameter (Medial-Lateral): 3.9 cm
RA Pressure Estimate: 5 mmHg
RR Interval: 612.24 ms
RV Peak Systolic Pressure: 30 mmHg
RVED Diameter BSA Index: 1.5 cm/m2
RVED Diameter Height Index: 2.2 cm/m
RVED Diameter: 3.65 cm
Weight (lbs): 304 [lb_av]
Weight: 4864 oz

## 2017-09-21 MED ORDER — ALBUTEROL SULFATE HFA 108 (90 BASE) MCG/ACT IN AERS *I*
1.0000 | INHALATION_SPRAY | Freq: Four times a day (QID) | RESPIRATORY_TRACT | 3 refills | Status: DC | PRN
Start: 2017-09-21 — End: 2018-12-27

## 2017-09-21 MED ORDER — GENERIC DME *A*
0 refills | Status: DC
Start: 2017-09-21 — End: 2017-11-17

## 2017-09-21 MED ORDER — BUDESONIDE-FORMOTEROL FUMARATE 160-4.5 MCG/ACT IN AERO *I*
2.0000 | INHALATION_SPRAY | Freq: Two times a day (BID) | RESPIRATORY_TRACT | 2 refills | Status: DC
Start: 2017-09-21 — End: 2018-05-10

## 2017-09-21 NOTE — Telephone Encounter (Signed)
Received call from Joni ReiningNicole at [facility] who stated they do not carry dehumidifiers (Dr. Maureen ChattersGeoras had sent Rx for one).  Dr. Maureen ChattersGeoras notified.  Pt also called and notified.  She was appreciative for the call.

## 2017-09-21 NOTE — Patient Instructions (Signed)
Stop using Advair and Breo    Start Symbicort 160 mcg two puffs twice daily via spacer, and rinse mouth afterwards, every day    Use albuterol as needed    I will order a home sleep test.  You may have sleep apnea and need a CPAP mask at night    Follow-up with me on August 28 at 10:30 a.m.

## 2017-09-21 NOTE — Progress Notes (Signed)
[name] Clinic Note    [name] for evaluation and management of asthma and dyspnea during pregnancy.  Jenna [name] is a 32 yo woman with history of asthma diagnosed at age 32. I do not have access to records of prior work up.  She reports using different inhalers over the years, Advair for about two years, with frequent exacerbations requiring ED / UC visits for nebulizers.  Asthma triggers include change in body position and change in temperature.  She has seasonal allergy symptoms but no prior allergy work-up.     She is [redacted] weeks pregnant with twins and has had increasing breathing problems over the past few months.  Specifically she gets winded climbing stairs and laying down, and has been using her inhalers frequently.  There were some misconceptions about inhaler use, currently using Advair regularly and Breo as needed.    She is overweight with BMI of 49.9.  A major complaint is daytime sleepiness and poor sleep.  She snores loudly with apneas witnessed by her husband.     She smokes one cigarette per week.    A C-section is planned, and one of the purposes of today's visit was risk stratification.      Asthma Biomarkers  IgE: n/a  Eosinophil Count: 100-500 (max 2016)  FeNO: n/a    Asthma Control Test (score of 20 or more indicates good control): ACT SCORE 12 and OLDER: 7    Medications: A complete medication list was reviewed and updated with the patient. Pertinent pulmonary medications include: Advair, Breo    Allergies and smoking history: Allergic reactions and smoking history were reviewed and updated.      Family history: five children, 32 year old son with asthma     Occupational history: unemployed     Environmental exposures: carpet at home.  Section 8 housing with mold in the basement.  The landlord hired someone to bleach it and paint it over.       Review of systems: A comprehensive ROS was performed and pertinent findings include those  mentioned above. Pertinent negatives include the absence of rash or arthralgias.     Physical Exam:    Blood pressure 134/74, pulse 86, height 1.651 m (5\' 5" ), weight 136.1 kg (300 lb), last menstrual period 02/27/2017, SpO2 99 %, not currently breastfeeding.on RA    Gen:   Well-appearing, NAD  HEENT: Anicteric, no conjunctival injection, oropharynx clear, without thrush.  Neck:  No cervical or supraclavicular lymphadenopathy. Trachea symmetric.  No thyromegaly. No JVD.  Circumference = 16 inches   Resp:  clear unlabored breathing   CV:    Regular S1S2,  No murmur, no JVD,  no pedal edema   Msk: Skin warm and dry, No cyanosis, no clubbing, no joint tenderness   Neuro: Alert and oriented X 3 without focal findings    Eppworth scale = 16    Imaging: n/a    Impression:     Multifactorial dyspnea in a 32 yo obese woman [redacted] weeks pregnant with twins  - Dyspnea likely from asthma, obesity, and lung restriction from pregnancy  - She has a cardiology evaluation later today  - Asthma diagnosis based on history  - She is not wheezing today  - I provided education about maintenance vs rescue inhaler   - I re-ordered Symbicort 160 mcg 2 BID and provided a spacer  - Albuterol prn MDI prescribed  - I will try  to prescribe a dehumidifier at home via medicaid given mold in the basement  - I ordered a home sleep test  - There is a high pre-test probability of OSA  - We will re-group by MyChart    Follow-up in 1.5 months with me or Drenda Freeze NP, or sooner as needed.    Recommendations:  I reviewed the following recommendations with the patient, answered all questions, and provided a written copy of this plan.      Patient Instructions   Stop using Advair and Breo    Start Symbicort 160 mcg two puffs twice daily via spacer, and rinse mouth afterwards, every day    Use albuterol as needed    I will order a home sleep test.  You may have sleep apnea and need a CPAP mask at night    Follow-up with me on August 28 at 10:30 a.m.

## 2017-09-22 ENCOUNTER — Ambulatory Visit: Payer: Medicaid (Managed Care)

## 2017-09-22 ENCOUNTER — Ambulatory Visit: Payer: Medicaid (Managed Care) | Attending: Obstetrics

## 2017-09-22 ENCOUNTER — Other Ambulatory Visit
Admission: RE | Admit: 2017-09-22 | Discharge: 2017-09-22 | Disposition: A | Discharge status: Home / Self Care | Payer: Medicaid (Managed Care) | Source: Ambulatory Visit

## 2017-09-22 DIAGNOSIS — Z362 Encounter for other antenatal screening follow-up: Secondary | ICD-10-CM

## 2017-09-22 DIAGNOSIS — O30043 Twin pregnancy, dichorionic/diamniotic, third trimester: Secondary | ICD-10-CM | POA: Insufficient documentation

## 2017-09-22 DIAGNOSIS — O10013 Pre-existing essential hypertension complicating pregnancy, third trimester: Secondary | ICD-10-CM

## 2017-09-22 DIAGNOSIS — Z6841 Body Mass Index (BMI) 40.0 and over, adult: Secondary | ICD-10-CM | POA: Insufficient documentation

## 2017-09-22 DIAGNOSIS — O99333 Smoking (tobacco) complicating pregnancy, third trimester: Secondary | ICD-10-CM | POA: Insufficient documentation

## 2017-09-22 DIAGNOSIS — Z3480 Encounter for supervision of other normal pregnancy, unspecified trimester: Secondary | ICD-10-CM | POA: Insufficient documentation

## 2017-09-22 DIAGNOSIS — O99212 Obesity complicating pregnancy, second trimester: Secondary | ICD-10-CM | POA: Insufficient documentation

## 2017-09-22 DIAGNOSIS — Z3A25 25 weeks gestation of pregnancy: Secondary | ICD-10-CM

## 2017-09-22 LAB — CBC
Hematocrit: 30 % — ABNORMAL LOW (ref 34–45)
Hemoglobin: 9.6 g/dL — ABNORMAL LOW (ref 11.2–15.7)
MCH: 28 pg/cell (ref 26–32)
MCHC: 32 g/dL (ref 32–36)
MCV: 88 fL (ref 79–95)
Platelets: 259 10*3/uL (ref 160–370)
RBC: 3.4 MIL/uL — ABNORMAL LOW (ref 3.9–5.2)
RDW: 14.7 % — ABNORMAL HIGH (ref 11.7–14.4)
WBC: 8.7 10*3/uL (ref 4.0–10.0)

## 2017-09-22 LAB — GLUCOSE TOLERANCE, 1 HOUR: Glucose,50gm 1HR: 96 mg/dL (ref 63–135)

## 2017-09-22 NOTE — Progress Notes (Incomplete)
[facility] Prenatal Visit    SUBJECTIVE  Jenna [name] is a 32 y.o., [phone] female presenting for routine OBC at 8322w4d. ***     Contractions: {Yes/No/Free text:23311}   Loss of fluid: {Yes/No/Free text:23311}   Bleeding: {Yes/No/Free text:23311}   FM:  {Yes/No/Free text:23311}      Pregnancy complicated by:   Patient Active Problem List   Diagnosis Code    Obesity, Class III, BMI 49.1 (morbid obesity) E66.01    Hx Cocaine abuse F14.11    Chronic hypertension in pregnancy O10.919    Asthma J45.909    Depression F32.9    Family history of SIDS (sudden infant death syndrome) 47Z84.82    Migraines G43.909    Supervision of high-risk pregnancy O09.90    History of cesarean delivery, T incision with 4th c/s O34.219    Dichorionic diamniotic twin gestation O30.049     Problem list, vitals, family & social history, medications, and allergies were reviewed and updated as appropriate today.      OBJECTIVE  There were no vitals filed for this visit.           Urine POCT:        *** Patient did not leave urine specimen     Exam:            {female adult master:310786}      ASSESSMENT / PLAN  32 y.o., [id number] at 2422w4d with high-risk pregnancy. ***    No orders of the defined types were placed in this encounter.    Today's Plan:  Asthma  - saw pulmonology yesterday  - symbicort added to patient's regimen  - stable, no SOB, wheezing today.   - continues with albuterol PRN     Supervision of high-risk pregnancy  - labs ordered- patient to get them done, including 1hr GTT      History of cesarean delivery, T incision with 4th c/s  - desires repeat LTCS and BTL  - DSS signed and in chart  - consents for LTCS and BTL done today, will be scanned in media      Dichorionic diamniotic twin gestation  - 7/11 US: concordant growth, normal fluid, no anatomic defects  ---A: EFW 58%ile, B: EFW 62%ile    Hx Cocaine abuse  - no current use     Chronic hypertension in pregnancy  - normotensive today  - patient to obtain 24 hour  urine today  - compliant with ASA    Follow-up:   Return to the office in {Time; weeks 1-4:12805} for ***.      Jenna Anisheryl S Yarenis Cerino, MD  09/22/2017  2:38 PM

## 2017-09-22 NOTE — Assessment & Plan Note (Signed)
-   labs ordered- patient to get them done, including 1hr GTT

## 2017-09-22 NOTE — Assessment & Plan Note (Signed)
-   normotensive today  - patient to obtain 24 hour urine today  - compliant with ASA

## 2017-09-22 NOTE — Assessment & Plan Note (Signed)
-   desires repeat LTCS and BTL  - DSS signed and in chart  - consents for LTCS and BTL done today, will be scanned in media

## 2017-09-22 NOTE — Assessment & Plan Note (Signed)
-   no current use

## 2017-09-22 NOTE — Assessment & Plan Note (Signed)
-   7/11 US: concordant growth, normal fluid, no anatomic defects  ---A: EFW 58%ile, B: EFW 62%ile

## 2017-09-22 NOTE — Assessment & Plan Note (Signed)
-   saw pulmonology yesterday  - symbicort added to patient's regimen  - stable, no SOB, wheezing today.   - continues with albuterol PRN

## 2017-09-23 ENCOUNTER — Encounter: Payer: Self-pay | Admitting: Obstetrics and Gynecology

## 2017-09-23 LAB — SYPHILIS SCREEN
Syphilis Screen: NEGATIVE
Syphilis Status: NONREACTIVE

## 2017-09-25 ENCOUNTER — Telehealth: Payer: Self-pay | Admitting: Reproductive Endocrinology and Infertility

## 2017-09-25 MED ORDER — MICONAZOLE NITRATE 2 % VA CREA *I*
1.0000 | TOPICAL_CREAM | Freq: Every evening | VAGINAL | 0 refills | Status: AC
Start: 2017-09-25 — End: 2017-10-02

## 2017-09-25 NOTE — Telephone Encounter (Signed)
Patient calls the answering service with complaints of vulvar itching. She notes this started yesterday and she has been using some cream she has for her baby and has been keeping a wet washcloth on her vulva to help with itching. She denies any discharge, LOF, VB,CTX. She endorses good FM for both babies.     An Rx for Monistat was sent to her pharmacy. She was instructed to apply it as directed and monitor her symptoms. If they persist or worsen she was advised to call back the answering service or schedule and appointment in the office on Monday.

## 2017-09-28 ENCOUNTER — Other Ambulatory Visit: Payer: Self-pay | Admitting: Pulmonology

## 2017-09-28 DIAGNOSIS — R5383 Other fatigue: Secondary | ICD-10-CM

## 2017-10-01 ENCOUNTER — Encounter: Payer: Self-pay | Admitting: Gastroenterology

## 2017-10-04 ENCOUNTER — Telehealth: Payer: Self-pay

## 2017-10-04 NOTE — Telephone Encounter (Signed)
Patient called to reschedule appointment today because she was tired. Patient asked if this was okay or if she should be seen today. Encouraged patient to come today if she could to be seen, patient stated she did not want to leave the house. Offered patient appointment next Tuesday, patient was concerned it was so far away. Told patient can have appointment today or I can call her tomorrow if there are any cancellations for [facility]. Patient agreed with being called tomorrow and having appointment held for her for next Tuesday.

## 2017-10-05 ENCOUNTER — Telehealth: Payer: Self-pay | Admitting: Pulmonology

## 2017-10-05 NOTE — Telephone Encounter (Signed)
TC to patient with an appointment date of 8/1 @ 7:30 for the sleep study.  Patient stated that she will not be able to take that appointment.  Patient will call back to get the telephone number to contact the sleep center to reschedule the appointment.

## 2017-10-05 NOTE — Telephone Encounter (Signed)
TC to [facility] to follow up on the faxed request for an approval for the overnight sleep study.    Authorization reference # [id number] from 7/19-8/19/19.  Sleep Study (95810 x1).    Jenna [name] was notified that the study was approved and I will contact with a appointment date and time.

## 2017-10-06 NOTE — Telephone Encounter (Signed)
[place] to [place] to give her the contact information to the sleep center to reschedule the sleep study appointment on 8/1.

## 2017-10-11 ENCOUNTER — Telehealth: Payer: Self-pay

## 2017-10-11 NOTE — Telephone Encounter (Signed)
Ordered medicab for patient and confirmed pick up time at 1:30p with patient.

## 2017-10-11 NOTE — Telephone Encounter (Signed)
[facility]. Attempt made to call patient - message left on voicemail asking that she return the call to the office.     Will mail letter asking that she call to reschedule, and will follow up with patient again in one week if no res[onse by then.

## 2017-10-12 NOTE — Telephone Encounter (Signed)
TC to patient to remind her to contact the sleep center to reschedule the sleep study if she's unable to keep the appointment.  Patient stated that she will call today.

## 2017-10-12 NOTE — Telephone Encounter (Signed)
Patient called to reschedule missed OBC, rescheduled for 8-1 and confirmed medicab transportation for her. Patient verbalized understanding and will be picked up @ 1:30p for appointment.

## 2017-10-12 NOTE — Progress Notes (Deleted)
[facility]    TEST ORDER FORM    PATIENT:  Jenna [name]    MRN:  [id number]      DOB:   11/22/1985    STUDY DATE:  ***  FOLLOW-UP DATE:  Visit date not found     STUDY TYPE: {DESC; SLEEP STUDY TYPES 2:[phone]}   Room # :     SPECIAL INSTRUCTIONS:  ***    Sleep MD:  {MISC; SDC SLEEP INITIALS:33321} *** Referring MD:  ***      PCP: Raiford NobleMahler, Stewart, MD    HEIGHT: ***' ***"   WEIGHT:  *** lb  ESS:   *** NECK:  *** "      AHI:  {NA:32542::"n/a"} LOW 02:  {NA:32542::"n/a"} %    [facility] / Other Sleep Notes: ***         Insurance Info:   ***  No Known Allergies (drug, envir, food or latex)    Patient Active Problem List   Diagnosis Code    Obesity, Class III, BMI 49.1 (morbid obesity) E66.01    Hx Cocaine abuse F14.11    Chronic hypertension in pregnancy O10.919    Asthma J45.909    Depression F32.9    Family history of SIDS (sudden infant death syndrome) Z84.82    Migraines G43.909    Supervision of high-risk pregnancy O09.90    History of cesarean delivery, T incision with 4th c/s O34.219    Dichorionic diamniotic twin gestation 62O30.049

## 2017-10-13 ENCOUNTER — Other Ambulatory Visit: Payer: Medicaid (Managed Care)

## 2017-10-13 ENCOUNTER — Ambulatory Visit: Payer: Medicaid (Managed Care) | Attending: Obstetrics and Gynecology | Admitting: Obstetrics and Gynecology

## 2017-10-13 VITALS — BP 136/73 | HR 90 | Ht 65.0 in | Wt 302.7 lb

## 2017-10-13 DIAGNOSIS — O0993 Supervision of high risk pregnancy, unspecified, third trimester: Secondary | ICD-10-CM

## 2017-10-13 DIAGNOSIS — O30043 Twin pregnancy, dichorionic/diamniotic, third trimester: Secondary | ICD-10-CM

## 2017-10-13 DIAGNOSIS — Z3A32 32 weeks gestation of pregnancy: Secondary | ICD-10-CM

## 2017-10-13 MED ORDER — ELECTRIC BREAST PUMP (FOR PERSONAL USE) *A*
0 refills | Status: DC
Start: 2017-10-13 — End: 2017-11-17

## 2017-10-13 NOTE — Progress Notes (Signed)
Brief [facility] communication:     Patient presented for prenatal appointment at [redacted]w[redacted]d and was seen by our medical student, Jenna EvensLaura [name].71 She endorsed pelvic pressure and back pain, and had questions about delivery planning, receiving a prescription for a breast pump, timing of next ultrasound, and activity restrictions. She reported to Jenna RiegerLaura that her brother was waiting in the waiting room and that she needed to step out to give him money (as he is currently caring for her oldest son), and that she would return to complete her evaluation. She then absconded. We were unable to find her in the building or in the parking lot. Attempted to call mobile and home numbers in her chart without response. Will forward message to Jenna [name] to contact patient and schedule follow up.     Important needs for patient:   - Repeat growth US (due at 32w in context of di-di twins)   - C-section/BTL consent and surgical scheduling (for 37w due to T incision)     Cleophus MoltKatrina Oreta Soloway, MD, PhD  OB/GYN PGY-4  [phone]  10/13/2017   3:24 PM

## 2017-10-14 ENCOUNTER — Telehealth: Payer: Self-pay

## 2017-10-14 NOTE — Telephone Encounter (Signed)
Call placed to patient today to help her [facility] She left the office somewhat abruptly yesterday and did not schedule the next appointment.     Today she apologized for leaving and explained she was upset about an argument she had with a family member. She accepted 2 pm appointment in two weeks.

## 2017-10-17 NOTE — Telephone Encounter (Signed)
Patient scheduled for ultrasound same day as next OBC, 10/27/17 at 1 pm. [facility].     Medicab set up: [employer] confirmation number - [id number]. 12 pm pick up for herself and daughter.     Patient also states she spoke to provider at her last appointment about a letter with "bedrest restrictions". She explains she is on probation and part of this includes the requirement for [facility]. This has been difficult for her, as it requires her to pack her one year old and stroller on bus. The physical discomforts of completing this task in the third trimester of twin pregnancy has been challenging.     Discussed with patient that bedrest is not recommended, as it carries its own risks and has not been shown to be beneficial. She states she can understand that, but did discuss letter and situation with provider, who was agreeable to providing letter. Will forward letter request to provider at their discretion.

## 2017-10-21 ENCOUNTER — Telehealth: Payer: Self-pay | Admitting: Pulmonology

## 2017-10-21 ENCOUNTER — Telehealth: Payer: Self-pay

## 2017-10-21 NOTE — Telephone Encounter (Signed)
[facility] patient called in stating she either dropped off paperwork or paperwork was given to her (approximately 2 pages) stating she's unable to work. Patient states the reason she can't work is because she's pregnant w/twins, she can barely stand & she suffers from constant pelvic pain. Patient went on to say this paperwork was possibly for DSS/[employer] but now her Probation Officer is requesting a copy of this paperwork be faxed to them ASAP.      FYI: Writer was unable to clarify the details with the patient because she wasn't exactly sure but states the [facility] will know.

## 2017-10-21 NOTE — Telephone Encounter (Signed)
TC to patient to remind her that her sleep study authorization expires on 8/19.  Patient stated that she will contact the sleep center to see if they have any available times before the expiration date.  Patient will try her best to get the study done before her follow up with Dr. Maureen ChattersGeoras on 8/28.  Patient stated that she is pregnant  with twins and has a lot of appointments that she's trying to keep.

## 2017-10-21 NOTE — Telephone Encounter (Addendum)
Patient was given standard Special Care Work Accommodations letter earlier this week, 10/18/2017. Since then she gave the letter to an employer but did not make copies for her probation office as well.     She asks today that I reprint letter and fax to probation today.     The previous letter was no longer available in chart, but writer had direct knowledge of the standard letter that was provided. The same letter was created again today and reviewed with MFM consulting in clinic today for accuracy of recommendations.     Call returned to patient - she was made aware I can get letter to her via MyChart, but without release cannot send to others. She verbalized understanding and agreed to plan. She asks that I mail a copy to her today and this task was completed as well.

## 2017-10-27 ENCOUNTER — Telehealth: Payer: Self-pay | Admitting: Obstetrics and Gynecology

## 2017-10-27 ENCOUNTER — Encounter: Payer: Self-pay | Admitting: Obstetrics and Gynecology

## 2017-10-27 ENCOUNTER — Ambulatory Visit: Payer: Medicaid (Managed Care) | Attending: Obstetrics | Admitting: Obstetrics and Gynecology

## 2017-10-27 ENCOUNTER — Ambulatory Visit: Payer: Medicaid (Managed Care) | Attending: Obstetrics and Gynecology

## 2017-10-27 VITALS — BP 124/61 | HR 93 | Ht 65.0 in | Wt 304.8 lb

## 2017-10-27 DIAGNOSIS — O99333 Smoking (tobacco) complicating pregnancy, third trimester: Secondary | ICD-10-CM | POA: Insufficient documentation

## 2017-10-27 DIAGNOSIS — Z6841 Body Mass Index (BMI) 40.0 and over, adult: Secondary | ICD-10-CM

## 2017-10-27 DIAGNOSIS — F1411 Cocaine abuse, in remission: Secondary | ICD-10-CM

## 2017-10-27 DIAGNOSIS — O10013 Pre-existing essential hypertension complicating pregnancy, third trimester: Secondary | ICD-10-CM | POA: Insufficient documentation

## 2017-10-27 DIAGNOSIS — Z3483 Encounter for supervision of other normal pregnancy, third trimester: Secondary | ICD-10-CM

## 2017-10-27 DIAGNOSIS — Z3A34 34 weeks gestation of pregnancy: Secondary | ICD-10-CM

## 2017-10-27 DIAGNOSIS — J45909 Unspecified asthma, uncomplicated: Secondary | ICD-10-CM

## 2017-10-27 DIAGNOSIS — F32A Depression, unspecified: Secondary | ICD-10-CM

## 2017-10-27 DIAGNOSIS — O34219 Maternal care for unspecified type scar from previous cesarean delivery: Secondary | ICD-10-CM

## 2017-10-27 DIAGNOSIS — O10919 Unspecified pre-existing hypertension complicating pregnancy, unspecified trimester: Secondary | ICD-10-CM

## 2017-10-27 DIAGNOSIS — O99212 Obesity complicating pregnancy, second trimester: Secondary | ICD-10-CM

## 2017-10-27 DIAGNOSIS — Z362 Encounter for other antenatal screening follow-up: Secondary | ICD-10-CM | POA: Insufficient documentation

## 2017-10-27 DIAGNOSIS — O30043 Twin pregnancy, dichorionic/diamniotic, third trimester: Secondary | ICD-10-CM

## 2017-10-27 DIAGNOSIS — F329 Major depressive disorder, single episode, unspecified: Secondary | ICD-10-CM

## 2017-10-27 DIAGNOSIS — O0993 Supervision of high risk pregnancy, unspecified, third trimester: Secondary | ICD-10-CM

## 2017-10-27 LAB — POCT URINALYSIS DIPSTICK
Blood,UA POCT: NEGATIVE
Glucose,UA POCT: NORMAL mg/dL
Lot #: 38907001
Nitrite,UA POCT: NEGATIVE
PH,UA POCT: 7 (ref 5–8)

## 2017-10-27 NOTE — Assessment & Plan Note (Signed)
-   No medications  - Baby ASA (patient not compliant)  - Baseline HELLP labs wnl, 24 hr urine not completed  - BP 124/61, no pre-eclampsia symptoms

## 2017-10-27 NOTE — Telephone Encounter (Signed)
OBGYN SURGICAL ADMISSION FORM     Date submitted: 10/27/2017       Submitted by: Resident Lauraine RinneAlexandra Jamie Alyan Hartline, MD. Clinic Attending day of submission[facility]. Continuity Provider: [facility].   Site where patient is seen: [facility]    Admission type:   SDA (24 hr or more anticipated stay)  Date of surgery/procedure:   Monday 11/14/17 at 4851w2d   [facility]    DOB:  05/01/1985  AGE: 32 y.o.  BMI: There is no height or weight on file to calculate BMI.  Phone:  [phone] (home) [phone] (work)  [phone] (mobile)  Address:  [address]  [place] [address]   Contact (if pt is a minor or living in another facility):     Preferred language: English  My Chart Status: Activated [1]    OBGYN Attending physician:  Daryl EasternSeligman                 Admitting diagnosis ICD 10 : H/o c-section    Z98.891 and di-di twin pregnancy  Procedure [CPT-4]:  Tubal w/ c-section   [phone]  Special equipment: None    Insurance company: Attending: NPI, Facet ID, Tax ID   Policy #: Percert #:   2nd insurance company:  Policy #: Precert #:     Allergies: Patient has no known allergies (drug, envir, food or latex).    Medical justification for admission 1 day prior to surgery/procedure: No     Types of anesthesia: Pending (to be chosen in conjunction with anesthesia team)  Estimated length of case: 2 hours    Preadmission testing:  - UPT: PRN day of surgery  - Type and screen and CBC    Office preop with MD:   Preop testing to be done on / at: prior to arrival DOS  Clearance appointments / Consults: N?A  Office appt w/ MD: 1 week(s) postop incision check and 6 week postpartum visit    Expected return to work: 6 weeks

## 2017-10-27 NOTE — Progress Notes (Addendum)
[facility] Prenatal Visit    SUBJECTIVE  Jenna [name] is a 32 y.o., [phone] female presenting for routine OBC at 1740w4d. Doing well overall.      Contractions: No   Loss of fluid: No   Bleeding: No   FM:  Yes      Pregnancy complicated by:   Patient Active Problem List   Diagnosis Code    Obesity, Class III, BMI 49.1 (morbid obesity) E66.01    Hx Cocaine abuse F14.11    Chronic hypertension in pregnancy O10.919    Asthma J45.909    Depression F32.9    Family history of SIDS (sudden infant death syndrome) Z84.82    Migraines G43.909    Supervision of high-risk pregnancy O09.90    History of cesarean delivery, T incision with 4th c/s O34.219    Dichorionic diamniotic twin gestation O30.049     Problem list, vitals, family & social history, medications, and allergies were reviewed and updated as appropriate today.      OBJECTIVE  Vitals:    10/27/17 1347   BP: 124/61   Pulse: 93   Weight: (!) 138.3 kg (304 lb 12.8 oz)   Height: 1.651 m (5\' 5" )      Fetal Heart Rate: 153, 147       Urine POCT:  Protein: (!) Trace  Glucose: Normal     Exam:  General: Well-appearing, NAD  HEENT: Normocephalic, atraumatic  Cardio: Regular rate and rhythm  Resp: Non-labored respirations  Abd: Soft, gravid, non-tender  Ext: No edema    ASSESSMENT / PLAN  31 y.o., [id number] at 3140w4d with high-risk pregnancy.     Orders Placed This Encounter   Procedures    POCT urinalysis dipstick     Today's Plan:  Obesity, Class III, BMI 49.1 (morbid obesity)  - no early GTT or 24 hr urine completed.    Chronic hypertension in pregnancy  - No medications  - Baby ASA (patient not compliant)  - Baseline HELLP labs wnl, 24 hr urine not completed  - BP 124/61, no pre-eclampsia symptoms    Hx Cocaine abuse  No current use    Depression  - Previously on Seroquel, Zoloft, and Trazodone  - Currently not on any meds  - Mood stable, no SI/HI    Asthma  - S/p pulm apt 7/10  - Albuterol PRN, Symbicort (patient reports not compliant)    History of  cesarean delivery, T incision with 4th c/s  - C/S and BTL consents signed today.   - DSS signed 07/20/17  - Surgical scheduling request sent for repeat CS at 37 weeks given di-di twins and hx of T incision with dense fascial/bladder adhesions in last C/S    Dichorionic diamniotic twin gestation  - Growth US today  - Baby A EFW 32%, Baby B EFW 39% - 4% discordance, normal fluid in both sacs    Follow-up:   Return to the office in 2 weeks for OBC.    D/w Dr. Conception ChancyHollenbach    Alexandra Jamie Morell, MD  10/27/2017  2:40 PM     Maternal Fetal Medicine Fellow Attestation:  At the time of the visit, I discussed the evaluation and care of the patient. Briefly, Jenna Maxinshley Bosket is a 32 y.o. [id number] at 6740w4d who presents today for routine prenatal care.  Her pregnancy has been complicated most notably by dichorionic twin pregnancy, obesity with BMI 49, chronic hypertension, depression, history of cocaine use, and asthma. Ultrasound performed today revealed concordant  fetal growth and normal amniotic fluid volume. Today we focused on care coordination with scheduling of her repeat section at the previously determined gestational age of 7537w.      The note documented above reflects our shared plan for the direction of the service and I agree with the resident's findings and plan of care.    Yalanda Soderman J. Sharion BalloonHollenbach, M.D.  Maternal Fetal Medicine   Pager [phone]  10/27/2017 3:29 PM

## 2017-10-27 NOTE — Assessment & Plan Note (Signed)
-   S/p pulm apt 7/10  - Albuterol PRN, Symbicort (patient reports not compliant)

## 2017-10-27 NOTE — Assessment & Plan Note (Addendum)
-   no early GTT or 24 hr urine completed.

## 2017-10-27 NOTE — Assessment & Plan Note (Signed)
-   C/S and BTL consents signed today.   - DSS signed 07/20/17  - Surgical scheduling request sent for repeat CS at 37 weeks given di-di twins and hx of T incision with dense fascial/bladder adhesions in last C/S

## 2017-10-27 NOTE — Assessment & Plan Note (Signed)
-   Growth US today  - Baby A EFW 32%, Baby B EFW 39% - 4% discordance, normal fluid in both sacs

## 2017-10-27 NOTE — Assessment & Plan Note (Signed)
No current use!

## 2017-10-27 NOTE — Assessment & Plan Note (Signed)
-   Previously on Seroquel, Zoloft, and Trazodone  - Currently not on any meds  - Mood stable, no SI/HI

## 2017-11-04 ENCOUNTER — Telehealth: Payer: Self-pay

## 2017-11-04 DIAGNOSIS — O30043 Twin pregnancy, dichorionic/diamniotic, third trimester: Secondary | ICD-10-CM

## 2017-11-04 DIAGNOSIS — F5089 Other specified eating disorder: Secondary | ICD-10-CM

## 2017-11-04 DIAGNOSIS — Z98891 History of uterine scar from previous surgery: Secondary | ICD-10-CM

## 2017-11-04 DIAGNOSIS — Z01818 Encounter for other preprocedural examination: Secondary | ICD-10-CM

## 2017-11-04 NOTE — Telephone Encounter (Addendum)
OBGYN SURGICAL ADMISSION FORM     Date submitted: 10/27/2017       Submitted by: Resident Lauraine RinneAlexandra Jamie Morell, MD.   [facility]. Continuity Provider: [facility].   Site where patient is seen: [facility]    Admission type:   SDA (24 hr or more anticipated stay)  Date of surgery/procedure:  11/15/17  (holiday)  EDD 12/04/17  [facility]    DOB:  06/25/1985  AGE: 32 y.o.  BMI: 51  Phone:  [phone] (home) [phone] (work)  [phone] (mobile)  Address:  [address]  [place] [id number]      Preferred language: English  My Chart Status: Activated [1]    OBGYN Attending physician:  Daryl EasternSeligman                 Admitting diagnosis ICD 10 : H/o c-section    Z98.891 and di-di twin pregnancy  Procedure [CPT-4]:  Tubal w/ c-section   [address]  Special equipment: None    Insurance company:    Gaylyn RongFIDELIS  [id number]   MEDICAID [id number]     Allergies: Patient has no known allergies (drug, envir, food or latex).    Medical justification for admission 1 day prior to surgery/procedure: No     Types of anesthesia: Pending (to be chosen in conjunction with anesthesia team)  Estimated length of case: 2 hours    Preadmission testing:  - UPT: PRN day of surgery  - Type and   - CBC    Office preop with MD:   Preop testing to be done on / at: prior to arrival DOS  Clearance appointments / Consults: N?A  Office appt w/ MD: 1 week(s) postop incision check and 6 week postpartum visit    Expected return to work: 6 weeks    11/04/2017  Request enterd for OR scheduling.  Patient notification is pending OR confirmation Jenna [name]    11/07/2017  Called pt > left VM asking pt to call [phone] regarding c-section information. Details will be in her mychart, please call with any questions. Letter sent via mychart and USPS. Orders entered. Labs will need to be done day of surgery due to weekend and Labor Day. Will call L&D to make them aware that pt will need to arrive earlier  than usual.  Jenna [name]     11/07/2017  Spoke with L&D, made aware that pt will be unable to have labs done prior to surgery and will need to arrive early.   Jenna [name]     11/07/2017  Pt called back, went over surgery details and told pt to call back with any questions.   Jenna [name]     11/15/2017  Surgery rescheduled to 9/5 w/ Olson-Chen. OR confirmed. New letter sent via mychart, confirmed surgery with patient.   Jenna [name]

## 2017-11-04 NOTE — Telephone Encounter (Signed)
Per Dr Wallace CullensGray: I think it is reasonable to write a letter requesting accommodations for that due to complications of pregnancy. But I don't think we are the ones that would "excuse" it. She should check with the probation office to make sure they accept our letter.    Writer spoke with pt and reiterated that [facility] providers could only suggest that due to pregnancy complications, pt be excused from appointments for this week.   Writer directly told pt that only the  Engineer, drillingrobation officer could excuse her from appointments.  Pt voiced good understanding.  Writer wrote letter and pt will get through AllstateMyChart

## 2017-11-04 NOTE — Telephone Encounter (Signed)
Pt calling and requesting that she have a letter excusing her from upcoming appointments for a week such as her mental health and probation appt. Stated she is feeling a lot of pressure " down there" and its hard w/ a 32 year old and stroller.

## 2017-11-04 NOTE — Telephone Encounter (Signed)
Late entry:  When nurse telephoned pt to discuss letter, pt asked writer " to hold a minute because I have to throw up."    When pt returned to phone, pt informed the nurse that she was eating cornstarch " and must have eaten too much ".Scientist, research (medical).  Writer asked pt about her history of this as it was not on her problem list in medical record and not mentioned in providers notes.  Pt states that she has been doing it for years..  Pt aware it will be added to problem list and [facility].    Discussion with Barbaraann ShareAnne C and Bevelyn NgoKathy K, NPs.   Will discuss with pt's actual [facility] provider for next visit.

## 2017-11-04 NOTE — Telephone Encounter (Signed)
Pt is requesting a letter excusing her from appointments for next week.  A C/S is in process of being scheduled.  (potentially 11/15/17).  Pt is finding it very difficult to stand, walk with increased pelvic pressure .  Pt denies leaking of fluid, denies vaginal bleeding, denies contractions.  Babies are moving. Pt states pressure is relieved with rest  Writer explains that this would need to have provider approval.  Writer agrees to send to provider, will contact pt after this review.

## 2017-11-07 ENCOUNTER — Telehealth: Payer: Self-pay

## 2017-11-07 NOTE — Telephone Encounter (Signed)
Patient wondering if she needs tp [facility] as she is scheduled for c-section next week. Explained the importance of keeping her OBC as scheduled. Cab ride set up for her through [facility].

## 2017-11-07 NOTE — Telephone Encounter (Signed)
PT called in requesting to speak to someone in regards to having questions about her c-section.

## 2017-11-07 NOTE — Addendum Note (Signed)
Addended by: Webb SilversmithGLEASON, Cyree Chuong M. on: 11/07/2017 09:08 AM     Modules accepted: Orders

## 2017-11-09 ENCOUNTER — Ambulatory Visit: Payer: Medicaid (Managed Care) | Admitting: Pulmonology

## 2017-11-09 ENCOUNTER — Encounter: Payer: Self-pay | Admitting: Reproductive Endocrinology and Infertility

## 2017-11-10 ENCOUNTER — Ambulatory Visit: Payer: Medicaid (Managed Care)

## 2017-11-11 NOTE — Anesthesia Preprocedure Evaluation (Addendum)
Anesthesia Pre-operative History and Physical for Jenna [name]    Highlighted Issues for this Procedure:  32 yo woman with a di-di twin IUP, for a scheduled fifth low transverse cesarean delivery. NPO for solids at 2300 on 9/4. OB USG on 10/27/17 shows posterior-high and posterior-mid placentas. Labs from 11/16/17 are OK, except for Hct=29. BMI=51. NKDA. PMH: HTN, depression, stable asthma, no cocaine in more than one year. -Dr. Evonnie Dawes          .  Marland Kitchen  Anesthesia Evaluation Information Source: patient, records     ANESTHESIA HISTORY     Denies anesthesia history    GENERAL    + Obesity          central    + Substance abuse (clean for > 1 year)          cocaine    HEENT     Denies HEENT issues PULMONARY    + Smoker          tobacco currently    + Asthma          mild intermittent    CARDIOVASCULAR  Good(4+METs) Exercise Tolerance    + Hypertension          well controlled    GI/HEPATIC/RENAL     Denies GI/hepatic/renal issues  Last PO Intake: >8hr before procedure and >2hr before procedure (clears) NEURO/PSYCH    + Headaches          migraines    + Psychiatric Issues          depression    ENDO/OTHER     Denies endo issues    HEMATOLOGIC    + Blood dyscrasia          anemia         Physical Exam    Airway            Mouth opening: normal            Mallampati: II            TM distance (fb): >3 FB            Neck ROM: full            Airway Impression: easy  Dental   Normal Exam       Cardiovascular  Normal Exam           Rhythm: regular           Rate: normal        General Survey    Normal Exam   Pulmonary   Normal Exam    breath sounds clear to auscultation    Mental Status   Normal Exam       ________________________________________________________________________  PLAN  ASA Score  3  Anesthetic Plan spinal - Plan= spinal with general as a backup technique. -Dr. Evonnie Dawes    ; Line ( use current access); Monitoring (standard ASA); Positioning (left uterine tilt, supine and arms out); PONV Plan (ondansetron); Pain (per  surgical team and neuraxial morphine); PostOp (PACU)    Informed Consent     Risks:         Risks discussed were commensurate with the plan listed above with the following specific points: hypotension, headache and failed block, Damage to: nerves, unexpected serious injury.    Anesthetic Consent:         Anesthetic plan (and risks as noted above) were discussed with patient    Blood products Consent:  Use of blood products discussed with: patient and they consented    Plan also discussed with team members including:       attending and resident    Attending Attestation:  As the primary attending anesthesiologist, I attest that the patient or proxy understands and accepts the risks and benefits of the anesthesia plan. I also attest that I have personally performed a pre-anesthetic examination and evaluation, and prescribed the anesthetic plan for this particular location within 48 hours prior to the anesthetic as documented. Jenna CatholicICHARD N Shamila Lerch, MD,PHD 2:31 PM    Note: This attending preanesthetic evaluation was completed at 1152 on 11/17/17, in room 05-1420. -Dr. Evonnie Daweswissler

## 2017-11-14 ENCOUNTER — Telehealth: Payer: Self-pay | Admitting: Obstetrics and Gynecology

## 2017-11-14 NOTE — Telephone Encounter (Signed)
Brief OBGYN attending note    Marlicia Eskola is a 32 y.o. [id number] at [redacted]w[redacted]d who calls the [address] deck directly reporting child care issues affecting her ability to come to her cesarean delivery scheduled for tomorrow morning.     Pregnancy Risks:  1. Hx of LTCS x4 (#4 06/2016 with T incision, PPH of 2L, dense fascial and bladder adhesions). For repeat + BTL.  2. Dichorionic Diamniotic Twin Gestation (10/27/2017: normal growth and fluid)  3. Chronic Hypertension (no meds, noncompliant w/pre-eclampsia baseline testing)  4. Hx of PPH  5. Hx of Preeclampsia in prior pregnancy  6. Moderate persistent asthma on symbicort and albuterol  7. Depression (hospitalization 11/24/15-12/15/17 for SI; No current medication)  8. Morbid Obesity - BMI 50  9. Hx of Cocaine Abuse  10. History of SIDS (2nd child)  47. Migraines    Reviewed surgical schedule for the week. Patient amenable to rescheduling on Thursday at 1 pm time slot. Will route encounter to surgical scheduler for re-scheduling.    Pt will also need help with transportation to the hospital. Will route encounter to SW to help arrange medicaid cab pick up ([address], [place]. [phone])    Kathee Polite, MD  Maternal-Fetal Medicine Fellow, PGY5

## 2017-11-15 ENCOUNTER — Telehealth: Payer: Self-pay

## 2017-11-15 NOTE — Telephone Encounter (Signed)
C/S on 11/17/17. SW asked writer to contact pt as pt was concerned about preeclampsia S+S.   Pt reports to writer that her ankles are swollen ( bilaterally).  Pt confirms that they worsen throughout the day, but over  Night swelling goes down.  Pt denies headaches, denies vision changes, denies RUQ abdominal pain.  Pt also denies leaking of fluid, denies vaginal bleeding, denies contractions, denies decreased fetal movement.  Pt is aware to watch for all mentioned symptoms and call [facility] if she does develop them.  Pt felt comfortable with this advise.

## 2017-11-15 NOTE — Progress Notes (Signed)
Social Work Note -  Received message from Dr. Lum Keas about pt's transportation needs for c-section scheduled for Thursday, 9/5.  Medicaid cab arranged for pt.  [employer] to pick up pt on 9/5 at 10am (invoice #[phone]).  Pt contacted, aware of arrangements.    Elissa Hefty, LMSW [phone], [phone]

## 2017-11-16 ENCOUNTER — Telehealth: Payer: Self-pay

## 2017-11-16 ENCOUNTER — Inpatient Hospital Stay
Admission: AD | Admit: 2017-11-16 | Discharge: 2017-11-19 | DRG: 540 | Disposition: A | Discharge status: Home / Self Care | Payer: Medicaid (Managed Care) | Source: Ambulatory Visit | Attending: Obstetrics | Admitting: Obstetrics

## 2017-11-16 DIAGNOSIS — E669 Obesity, unspecified: Secondary | ICD-10-CM | POA: Diagnosis present

## 2017-11-16 DIAGNOSIS — O30049 Twin pregnancy, dichorionic/diamniotic, unspecified trimester: Secondary | ICD-10-CM | POA: Diagnosis present

## 2017-11-16 DIAGNOSIS — O99344 Other mental disorders complicating childbirth: Secondary | ICD-10-CM | POA: Diagnosis present

## 2017-11-16 DIAGNOSIS — F329 Major depressive disorder, single episode, unspecified: Secondary | ICD-10-CM | POA: Diagnosis present

## 2017-11-16 DIAGNOSIS — O0990 Supervision of high risk pregnancy, unspecified, unspecified trimester: Secondary | ICD-10-CM

## 2017-11-16 DIAGNOSIS — O30043 Twin pregnancy, dichorionic/diamniotic, third trimester: Secondary | ICD-10-CM | POA: Diagnosis present

## 2017-11-16 DIAGNOSIS — Z98891 History of uterine scar from previous surgery: Secondary | ICD-10-CM

## 2017-11-16 DIAGNOSIS — F1411 Cocaine abuse, in remission: Secondary | ICD-10-CM

## 2017-11-16 DIAGNOSIS — Z3A37 37 weeks gestation of pregnancy: Secondary | ICD-10-CM

## 2017-11-16 DIAGNOSIS — F32A Depression, unspecified: Secondary | ICD-10-CM | POA: Diagnosis present

## 2017-11-16 DIAGNOSIS — O30009 Twin pregnancy, unspecified number of placenta and unspecified number of amniotic sacs, unspecified trimester: Secondary | ICD-10-CM

## 2017-11-16 DIAGNOSIS — O99214 Obesity complicating childbirth: Secondary | ICD-10-CM | POA: Diagnosis present

## 2017-11-16 DIAGNOSIS — Z302 Encounter for sterilization: Secondary | ICD-10-CM

## 2017-11-16 DIAGNOSIS — O34219 Maternal care for unspecified type scar from previous cesarean delivery: Secondary | ICD-10-CM

## 2017-11-16 DIAGNOSIS — O34211 Maternal care for low transverse scar from previous cesarean delivery: Secondary | ICD-10-CM | POA: Diagnosis present

## 2017-11-16 DIAGNOSIS — O321XX0 Maternal care for breech presentation, not applicable or unspecified: Secondary | ICD-10-CM | POA: Diagnosis present

## 2017-11-16 DIAGNOSIS — O10913 Unspecified pre-existing hypertension complicating pregnancy, third trimester: Secondary | ICD-10-CM | POA: Diagnosis present

## 2017-11-16 DIAGNOSIS — O9952 Diseases of the respiratory system complicating childbirth: Secondary | ICD-10-CM | POA: Diagnosis present

## 2017-11-16 DIAGNOSIS — O10919 Unspecified pre-existing hypertension complicating pregnancy, unspecified trimester: Secondary | ICD-10-CM | POA: Diagnosis present

## 2017-11-16 DIAGNOSIS — J45909 Unspecified asthma, uncomplicated: Secondary | ICD-10-CM | POA: Diagnosis present

## 2017-11-16 DIAGNOSIS — O368130 Decreased fetal movements, third trimester, not applicable or unspecified: Principal | ICD-10-CM | POA: Diagnosis present

## 2017-11-16 LAB — TP/CREATININE RATIO,UR: TP Creatinine ratio,UR: 0.12

## 2017-11-16 LAB — COMPREHENSIVE METABOLIC PANEL
ALT: 17 U/L (ref 0–35)
AST: 24 U/L (ref 0–35)
Albumin: 3.6 g/dL (ref 3.5–5.2)
Alk Phos: 205 U/L — ABNORMAL HIGH (ref 35–105)
Anion Gap: 13 (ref 7–16)
Bilirubin,Total: 0.2 mg/dL (ref 0.0–1.2)
CO2: 24 mmol/L (ref 20–28)
Calcium: 9.3 mg/dL (ref 8.8–10.2)
Chloride: 102 mmol/L (ref 96–108)
Creatinine: 0.76 mg/dL (ref 0.51–0.95)
GFR,Black: 120 *
GFR,Caucasian: 104 *
Glucose: 107 mg/dL — ABNORMAL HIGH (ref 60–99)
Lab: 6 mg/dL (ref 6–20)
Potassium: 4.1 mmol/L (ref 3.3–5.1)
Sodium: 139 mmol/L (ref 133–145)
Total Protein: 6.3 g/dL (ref 6.3–7.7)

## 2017-11-16 LAB — CBC
Hematocrit: 29 % — ABNORMAL LOW (ref 34–45)
Hemoglobin: 9 g/dL — ABNORMAL LOW (ref 11.2–15.7)
MCH: 26 pg/cell (ref 26–32)
MCHC: 31 g/dL — ABNORMAL LOW (ref 32–36)
MCV: 82 fL (ref 79–95)
Platelets: 202 10*3/uL (ref 160–370)
RBC: 3.5 MIL/uL — ABNORMAL LOW (ref 3.9–5.2)
RDW: 14.6 % — ABNORMAL HIGH (ref 11.7–14.4)
WBC: 9.1 10*3/uL (ref 4.0–10.0)

## 2017-11-16 LAB — UNABLE TO PERFORM ADD-ON TESTING 1

## 2017-11-16 LAB — TYPE AND SCREEN
ABO RH Blood Type: A POS
Antibody Screen: NEGATIVE

## 2017-11-16 LAB — CREATININE, URINE: Creatinine,UR: 90 mg/dL (ref 20–300)

## 2017-11-16 LAB — PROTEIN, URINE: Protein,UR: 11 mg/dL (ref 0–11)

## 2017-11-16 MED ORDER — ACETAMINOPHEN 325 MG PO TABS *I*
650.0000 mg | ORAL_TABLET | ORAL | Status: DC | PRN
Start: 2017-11-16 — End: 2017-11-17
  Administered 2017-11-16 – 2017-11-17 (×2): 650 mg via ORAL
  Filled 2017-11-16 (×2): qty 2

## 2017-11-16 MED ORDER — DEXTROSE 5 % FLUSH FOR PUMPS *I*
0.0000 mL/h | INTRAVENOUS | Status: DC | PRN
Start: 2017-11-16 — End: 2017-11-19

## 2017-11-16 MED ORDER — CEFAZOLIN SODIUM 1000 MG IJ SOLR *I*
3000.0000 mg | Freq: Once | INTRAMUSCULAR | Status: AC
Start: 2017-11-17 — End: 2017-11-18
  Filled 2017-11-16: qty 30

## 2017-11-16 MED ORDER — DIPHENHYDRAMINE HCL 50 MG ORAL SOLID *WRAPPED*
50.0000 mg | Freq: Every evening | ORAL | Status: DC | PRN
Start: 2017-11-16 — End: 2017-11-19
  Administered 2017-11-16: 50 mg via ORAL
  Filled 2017-11-16: qty 1

## 2017-11-16 MED ORDER — SODIUM CHLORIDE 0.9 % FLUSH FOR PUMPS *I*
0.0000 mL/h | INTRAVENOUS | Status: DC | PRN
Start: 2017-11-16 — End: 2017-11-19

## 2017-11-16 MED ORDER — LACTATED RINGERS IV SOLN *I*
100.0000 mL/h | INTRAVENOUS | Status: DC
Start: 2017-11-17 — End: 2017-11-19

## 2017-11-16 MED ORDER — CEFAZOLIN SODIUM 1000 MG IJ SOLR *I*
3000.0000 mg | Freq: Once | INTRAMUSCULAR | Status: DC
Start: 2017-11-16 — End: 2017-11-16

## 2017-11-16 MED ORDER — NIFEDIPINE CR OSMOTIC 30 MG PO TB24 *I*
30.0000 mg | ORAL_TABLET | Freq: Every day | ORAL | Status: DC
Start: 2017-11-16 — End: 2017-11-19
  Administered 2017-11-16 – 2017-11-19 (×4): 30 mg via ORAL
  Filled 2017-11-16 (×5): qty 1

## 2017-11-16 MED ORDER — BUDESONIDE-FORMOTEROL FUMARATE 160-4.5 MCG/ACT IN AERO *I*
2.0000 | INHALATION_SPRAY | Freq: Two times a day (BID) | RESPIRATORY_TRACT | Status: DC
Start: 2017-11-16 — End: 2017-11-19
  Administered 2017-11-16 – 2017-11-19 (×5): 2 via RESPIRATORY_TRACT
  Filled 2017-11-16 (×2): qty 6

## 2017-11-16 NOTE — Telephone Encounter (Signed)
PT  Calling stating she is pregnant with twins and her stomach keeps making "gargoling" noises. States she is not hungry and it has startd 3 days ago.  She also states she is have pain in the abdominal. Pt would like to speak to a nurse to make sure this is normal.

## 2017-11-16 NOTE — Procedures (Addendum)
Fetal Non-Stress Test  Patient: Jenna [name]  Age: 32 y.o.  Date of Birth: 08/11/1985  YBW:LSLHTD  MRN: [id number]  GA: [redacted]w[redacted]d    Reason for exam: DFM     Maternal   Heart Rate: 75 (11/16/17 1836)  BP: (!) 161/89 (11/16/17 1836)    Education Done:   External fetal monitoring: yes   Fetal kick counts: no   NST: yes   VAS: no   Manual stimulation:no    Other     Fetal non-stress test for twin pregnancy  Pre Procedure Diagnosis: Decreased fetal movements, third trimester  Post Procedure Diagnosis: NST (non-stress test) reactive            NST Start Time:  11/16/2017 2:50 PM            Uterine Irritability: No            Contractions: rare    NST Fetus A  Variability: moderate  FHR Baseline: 135  Decelerations: none   Accelerations: at least 15 BPM for 15 seconds  Stimulation: none  NST Interpretation: reactive    NST Fetus B  Variability: moderate  FHR Baseline: 135  Decelerations: none   Accelerations: at least 15 BPM for 15 seconds  Stimulation: none  NST Interpretation: reactive    End Time:  11/16/2017 4:30 PM          Duration of test (min): 100  Location of NST Fetal Heart Tracing: Archived electronically  Reviewed with:  Dr. Onnie Boer  Interpreting Provider Recommendations:  No further fetal testing needed unless indicated.  Comments:  Very challenging to trace twins concurrently due to fetal position and maternal habitus.         Test performed By: Tawni Levy, RN  11/16/2017 6:40 PM

## 2017-11-16 NOTE — Telephone Encounter (Signed)
Pt reports contractions every " couple of hours.", " stomach gets hard as a rock." .  Pt denies leaking of fluid, denies vaginal bleeding.  She is reporting that her abdomen hurts after she voids. , denies back pain. When asked pt sates that the " baby on the top has been very quiet for 2 days."  She sttes baby on the bottom is moving a lot, just like normal.  She has felt baby on the top move " very little, and it might not even be him. I cant tell."   Pt advised that she needs to go to [facility] for evaluation of decreased fetal movement.  Pt states that she is getting picked up at noon by medical transportation to go to [place] for pre op blood work. Pt is scheduled for C/S tomorrow.  Writer telephoned medical transportation and arranged for drop off to [facility].  Pick Up time needed to change.  Pt advised that [employer] would pick her , husband and 59 year old up between 1:30 and 2:30pm.  Pt aware to be ready at 1:30. She was advised to go to [facility] triage first for evaluation, then stop at Lab for pre op blood.  Importance of doing both was stressed.  Pt voiced understanding. OB TRIAGE was notified.
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
## 2017-11-16 NOTE — Progress Notes (Signed)
Pt arrived into [address] for NST for decreased fm baby B.  Will also get labs for tomorrows scheduled c/s.

## 2017-11-17 ENCOUNTER — Inpatient Hospital Stay
Admission: RE | Admit: 2017-11-17 | Payer: Medicaid (Managed Care) | Source: Ambulatory Visit | Admitting: Obstetrics and Gynecology

## 2017-11-17 ENCOUNTER — Encounter
Admission: AD | Disposition: A | Discharge status: Home / Self Care | Payer: Self-pay | Source: Ambulatory Visit | Attending: Obstetrics

## 2017-11-17 ENCOUNTER — Inpatient Hospital Stay: Payer: Medicaid (Managed Care) | Admitting: Anesthesiology

## 2017-11-17 ENCOUNTER — Encounter: Payer: Self-pay | Admitting: Anesthesiology

## 2017-11-17 ENCOUNTER — Other Ambulatory Visit: Payer: Self-pay

## 2017-11-17 DIAGNOSIS — Z98891 History of uterine scar from previous surgery: Secondary | ICD-10-CM

## 2017-11-17 DIAGNOSIS — Z3A37 37 weeks gestation of pregnancy: Secondary | ICD-10-CM

## 2017-11-17 DIAGNOSIS — O10013 Pre-existing essential hypertension complicating pregnancy, third trimester: Secondary | ICD-10-CM

## 2017-11-17 DIAGNOSIS — Z302 Encounter for sterilization: Secondary | ICD-10-CM

## 2017-11-17 LAB — SYPHILIS SCREEN
Syphilis Screen: NEGATIVE
Syphilis Screen: NEGATIVE
Syphilis Status: NONREACTIVE
Syphilis Status: NONREACTIVE

## 2017-11-17 LAB — DRUG SCREEN CHEMICAL DEPENDENCY, URINE
Amphetamine,UR: NEGATIVE
Benzodiazepinen,UR: NEGATIVE
Cocaine/Metab,UR: NEGATIVE
Opiates,UR: NEGATIVE
THC Metabolite,UR: NEGATIVE

## 2017-11-17 SURGERY — Surgical Case
Anesthesia: Spinal | Laterality: Bilateral | Wound class: Clean Contaminated

## 2017-11-17 SURGERY — Surgical Case
Anesthesia: Spinal | Site: Abdomen

## 2017-11-17 MED ORDER — FENTANYL CITRATE 50 MCG/ML IJ SOLN *WRAPPED*
INTRAMUSCULAR | Status: AC
Start: 2017-11-17 — End: 2017-11-17
  Filled 2017-11-17: qty 2

## 2017-11-17 MED ORDER — SOD CITRATE-CITRIC ACID 500-334 MG/5ML PO SOLN *I*
ORAL | Status: DC | PRN
Start: 2017-11-17 — End: 2017-11-17
  Administered 2017-11-17: 30 mL via ORAL

## 2017-11-17 MED ORDER — IBUPROFEN 600 MG PO TABS *I*
600.0000 mg | ORAL_TABLET | Freq: Four times a day (QID) | ORAL | Status: DC | PRN
Start: 2017-11-19 — End: 2017-11-19
  Administered 2017-11-19 (×2): 600 mg via ORAL
  Filled 2017-11-17 (×2): qty 1

## 2017-11-17 MED ORDER — MIDAZOLAM HCL 1 MG/ML IJ SOLN *I* WRAPPED
INTRAMUSCULAR | Status: DC | PRN
Start: 2017-11-17 — End: 2017-11-17
  Administered 2017-11-17 (×2): 1 mg via INTRAVENOUS

## 2017-11-17 MED ORDER — DOCUSATE SODIUM 100 MG PO CAPS *I*
100.0000 mg | ORAL_CAPSULE | Freq: Two times a day (BID) | ORAL | Status: DC | PRN
Start: 2017-11-17 — End: 2017-11-19
  Administered 2017-11-18: 100 mg via ORAL
  Filled 2017-11-17: qty 1

## 2017-11-17 MED ORDER — MORPHINE SULFATE *PF* 1 MG/ML IJ SOLN *I*
INTRAMUSCULAR | Status: AC
Start: 2017-11-17 — End: 2017-11-17
  Filled 2017-11-17: qty 10

## 2017-11-17 MED ORDER — DOCUSATE SODIUM 100 MG PO CAPS *I*
100.0000 mg | ORAL_CAPSULE | Freq: Two times a day (BID) | ORAL | 4 refills | Status: AC
Start: 2017-11-17 — End: 2017-12-17

## 2017-11-17 MED ORDER — LIDOCAINE HCL 1 % IJ SOLN *I*
INTRAMUSCULAR | Status: DC | PRN
Start: 2017-11-17 — End: 2017-11-17
  Administered 2017-11-17 (×2): 3 mL via SUBCUTANEOUS

## 2017-11-17 MED ORDER — DIPHENHYDRAMINE HCL 25 MG PO TABS *I*
25.0000 mg | ORAL_TABLET | Freq: Every evening | ORAL | Status: DC | PRN
Start: 2017-11-17 — End: 2017-11-19

## 2017-11-17 MED ORDER — CEFAZOLIN SODIUM 1000 MG IJ SOLR *I*
3000.0000 mg | Freq: Once | INTRAMUSCULAR | Status: AC
Start: 2017-11-17 — End: 2017-11-17
  Administered 2017-11-17: 3000 mg via INTRAVENOUS

## 2017-11-17 MED ORDER — LACTATED RINGERS IV SOLN *I*
50.0000 mL/h | INTRAVENOUS | Status: AC
Start: 2017-11-17 — End: 2017-11-18
  Administered 2017-11-17 (×2): 50 mL/h
  Administered 2017-11-17: 50 mL/h via INTRAVENOUS
  Administered 2017-11-17 – 2017-11-18 (×19): 50 mL/h

## 2017-11-17 MED ORDER — ACETAMINOPHEN 325 MG PO TABS *I*
650.0000 mg | ORAL_TABLET | Freq: Four times a day (QID) | ORAL | Status: DC
Start: 2017-11-17 — End: 2017-11-19
  Administered 2017-11-18 – 2017-11-19 (×4): 650 mg via ORAL
  Filled 2017-11-17 (×4): qty 2

## 2017-11-17 MED ORDER — SODIUM CHLORIDE 0.9 % 100 ML IV SOLN *I*
12.5000 mg | INTRAVENOUS | Status: DC | PRN
Start: 2017-11-17 — End: 2017-11-19
  Administered 2017-11-17: 12.5 mg via INTRAVENOUS
  Filled 2017-11-17: qty 1

## 2017-11-17 MED ORDER — OXYCODONE HCL 5 MG PO TABS *I*
5.0000 mg | ORAL_TABLET | ORAL | 0 refills | Status: DC | PRN
Start: 2017-11-17 — End: 2018-05-31

## 2017-11-17 MED ORDER — LACTATED RINGERS IV SOLN *I*
125.0000 mL/h | INTRAVENOUS | Status: DC
Start: 2017-11-17 — End: 2017-11-19

## 2017-11-17 MED ORDER — PLASMA-LYTE IV SOLN *WRAPPED*
Status: DC | PRN
Start: 2017-11-17 — End: 2017-11-17

## 2017-11-17 MED ORDER — EPHEDRINE 5MG/ML IN NS IV/IJ *WRAPPED*
INTRAMUSCULAR | Status: DC | PRN
Start: 2017-11-17 — End: 2017-11-17
  Administered 2017-11-17: 10 mg via INTRAVENOUS
  Administered 2017-11-17 (×2): 5 mg via INTRAVENOUS

## 2017-11-17 MED ORDER — MIDAZOLAM HCL 1 MG/ML IJ SOLN *I* WRAPPED
INTRAMUSCULAR | Status: AC
Start: 2017-11-17 — End: 2017-11-17
  Filled 2017-11-17: qty 2

## 2017-11-17 MED ORDER — OXYTOCIN 30 UNITS IN 500ML NS WRAPPED *I*
INTRAMUSCULAR | Status: DC
Start: 2017-11-17 — End: 2017-11-17
  Administered 2017-11-17: 100 m[IU]/min via INTRAVENOUS
  Filled 2017-11-17: qty 500

## 2017-11-17 MED ORDER — OXYCODONE HCL 5 MG PO TABS *I*
5.0000 mg | ORAL_TABLET | Freq: Four times a day (QID) | ORAL | Status: DC | PRN
Start: 2017-11-17 — End: 2017-11-19

## 2017-11-17 MED ORDER — ONDANSETRON HCL 2 MG/ML IV SOLN *I*
INTRAMUSCULAR | Status: DC | PRN
Start: 2017-11-17 — End: 2017-11-17
  Administered 2017-11-17: 4 mg via INTRAVENOUS

## 2017-11-17 MED ORDER — FENTANYL CITRATE 50 MCG/ML IJ SOLN *WRAPPED*
INTRAMUSCULAR | Status: DC | PRN
Start: 2017-11-17 — End: 2017-11-17
  Administered 2017-11-17: 10 ug via INTRATHECAL

## 2017-11-17 MED ORDER — SIMETHICONE 80 MG PO CHEW *I*
80.0000 mg | CHEWABLE_TABLET | Freq: Four times a day (QID) | ORAL | Status: DC | PRN
Start: 2017-11-17 — End: 2017-11-19

## 2017-11-17 MED ORDER — KETAMINE HCL 10 MG/ML IJ/IV SOLN *WRAPPED*
Status: DC | PRN
Start: 2017-11-17 — End: 2017-11-17
  Administered 2017-11-17 (×10): 10 mg via INTRAVENOUS

## 2017-11-17 MED ORDER — ACETAMINOPHEN 325 MG PO TABS *I*
650.0000 mg | ORAL_TABLET | ORAL | 6 refills | Status: DC | PRN
Start: 2017-11-17 — End: 2018-10-18

## 2017-11-17 MED ORDER — ENOXAPARIN SODIUM 40 MG/0.4ML IJ SOSY *I*
40.0000 mg | PREFILLED_SYRINGE | Freq: Two times a day (BID) | INTRAMUSCULAR | Status: DC
Start: 2017-11-18 — End: 2017-11-19
  Administered 2017-11-18 – 2017-11-19 (×3): 40 mg via SUBCUTANEOUS
  Filled 2017-11-17 (×5): qty 0.4

## 2017-11-17 MED ORDER — OXYCODONE HCL 10 MG PO TABS *I*
10.0000 mg | ORAL_TABLET | Freq: Four times a day (QID) | ORAL | Status: DC | PRN
Start: 2017-11-17 — End: 2017-11-19
  Administered 2017-11-18 – 2017-11-19 (×3): 10 mg via ORAL
  Filled 2017-11-17 (×3): qty 1

## 2017-11-17 MED ORDER — SODIUM CHLORIDE 0.9 % FLUSH FOR PUMPS *I*
0.0000 mL/h | INTRAVENOUS | Status: DC | PRN
Start: 2017-11-17 — End: 2017-11-19

## 2017-11-17 MED ORDER — FENTANYL CITRATE 50 MCG/ML IJ SOLN *WRAPPED*
INTRAMUSCULAR | Status: DC | PRN
Start: 2017-11-17 — End: 2017-11-17
  Administered 2017-11-17 (×2): 25 ug via INTRAVENOUS
  Administered 2017-11-17: 90 ug via INTRAVENOUS
  Administered 2017-11-17: 50 ug via INTRAVENOUS

## 2017-11-17 MED ORDER — TETANUS-DIPHTH-ACELL PERT 5-2.5-18.5 LF-MCG/0.5 IM SUSP *WRAPPED*
0.5000 mL | Freq: Once | INTRAMUSCULAR | Status: AC
Start: 2017-11-17 — End: 2017-11-18
  Filled 2017-11-17: qty 0.5

## 2017-11-17 MED ORDER — MISOPROSTOL 200 MCG PO TABS *I*
ORAL_TABLET | ORAL | Status: DC
Start: 2017-11-17 — End: 2017-11-17
  Filled 2017-11-17: qty 4

## 2017-11-17 MED ORDER — POLYETHYLENE GLYCOL 3350 PO PACK 17 GM *I*
17.0000 g | PACK | Freq: Every day | ORAL | Status: DC
Start: 2017-11-17 — End: 2017-11-19
  Administered 2017-11-18 – 2017-11-19 (×2): 17 g via ORAL
  Filled 2017-11-17 (×2): qty 17

## 2017-11-17 MED ORDER — MORPHINE SULFATE 2 MG/ML IV SOLN *WRAPPED*
Status: DC | PRN
Start: 2017-11-17 — End: 2017-11-17
  Administered 2017-11-17: 0.2 mg via INTRATHECAL

## 2017-11-17 MED ORDER — ONDANSETRON HCL 2 MG/ML IV SOLN *I*
4.0000 mg | Freq: Three times a day (TID) | INTRAMUSCULAR | Status: DC | PRN
Start: 2017-11-17 — End: 2017-11-19

## 2017-11-17 MED ORDER — OXYTOCIN 30 UNITS/500 ML NS *POSTPARTUM* *I*
100.0000 m[IU]/min | INTRAMUSCULAR | Status: DC
Start: 2017-11-17 — End: 2017-11-19
  Administered 2017-11-17 (×10): 100 m[IU]/min

## 2017-11-17 MED ORDER — DIPHENHYDRAMINE HCL 50 MG/ML IJ SOLN *I*
25.0000 mg | INTRAMUSCULAR | Status: DC | PRN
Start: 2017-11-17 — End: 2017-11-17

## 2017-11-17 MED ORDER — DEXTROSE 5 % FLUSH FOR PUMPS *I*
0.0000 mL/h | INTRAVENOUS | Status: DC | PRN
Start: 2017-11-17 — End: 2017-11-19

## 2017-11-17 MED ORDER — KETAMINE HCL 10 MG/ML IJ/IV SOLN *WRAPPED*
Status: AC
Start: 2017-11-17 — End: 2017-11-17
  Filled 2017-11-17: qty 20

## 2017-11-17 MED ORDER — IBUPROFEN 600 MG PO TABS *I*
600.0000 mg | ORAL_TABLET | Freq: Three times a day (TID) | ORAL | 3 refills | Status: AC | PRN
Start: 2017-11-17 — End: 2017-12-17

## 2017-11-17 MED ORDER — KETOROLAC TROMETHAMINE 30 MG/ML IJ SOLN *I*
30.0000 mg | Freq: Four times a day (QID) | INTRAMUSCULAR | Status: AC
Start: 2017-11-18 — End: 2017-11-18
  Administered 2017-11-18 (×4): 30 mg via INTRAVENOUS
  Filled 2017-11-17 (×4): qty 1

## 2017-11-17 MED ORDER — NIFEDIPINE 30 MG PO TB24 *I*
30.0000 mg | ORAL_TABLET | Freq: Every day | ORAL | 4 refills | Status: DC
Start: 2017-11-18 — End: 2018-05-10

## 2017-11-17 MED ORDER — NALBUPHINE HCL 10 MG/ML IJ SOLN *I*
2.0000 mg | Freq: Once | INTRAMUSCULAR | Status: AC
Start: 2017-11-17 — End: 2017-11-17
  Administered 2017-11-17: 2 mg via INTRAVENOUS
  Filled 2017-11-17: qty 1

## 2017-11-17 MED ORDER — PROPOFOL 10 MG/ML IV EMUL (INTERMITTENT DOSING) WRAPPED *I*
INTRAVENOUS | Status: DC | PRN
Start: 2017-11-17 — End: 2017-11-17
  Administered 2017-11-17 (×4): 30 mg via INTRAVENOUS

## 2017-11-17 MED ORDER — PHENYLEPHRINE 100 MCG/ML IN NS 10 ML *WRAPPED*
INTRAMUSCULAR | Status: DC | PRN
Start: 2017-11-17 — End: 2017-11-17
  Administered 2017-11-17 (×6): 100 ug via INTRAVENOUS

## 2017-11-17 MED ORDER — TERBUTALINE SULFATE 1 MG/ML IJ SOLN *I*
INTRAMUSCULAR | Status: DC
Start: 2017-11-17 — End: 2017-11-17
  Filled 2017-11-17: qty 1

## 2017-11-17 MED ORDER — OXYTOCIN 30 UNITS IN 500ML NS WRAPPED *I*
INTRAMUSCULAR | Status: DC | PRN
Start: 2017-11-17 — End: 2017-11-17
  Administered 2017-11-17: 500 mL via INTRAVENOUS
  Administered 2017-11-17 (×2): 100 mL via INTRAVENOUS

## 2017-11-17 MED ORDER — BUPIVACAINE IN DEXTROSE 0.75-8.25 % IT SOLN *I*
INTRATHECAL | Status: DC | PRN
Start: 2017-11-17 — End: 2017-11-17
  Administered 2017-11-17: 1.6 mL via INTRATHECAL

## 2017-11-17 MED ORDER — KETOROLAC TROMETHAMINE 30 MG/ML IJ SOLN *I*
INTRAMUSCULAR | Status: DC | PRN
Start: 2017-11-17 — End: 2017-11-17
  Administered 2017-11-17: 30 mg via INTRAVENOUS

## 2017-11-17 SURGICAL SUPPLY — 3 items
CLIP FILSHIE (Implant) ×2 IMPLANT
CLIP INT CLS W3.5XH4XL13MM TBL LIG SYS CONTRACEPTIVE OCCL DEV NONMAGNETIC (Implant) ×2 IMPLANT
DRESSING PRIMAPORE 11-3/4X4 (Dressing) ×2 IMPLANT

## 2017-11-17 NOTE — Progress Notes (Signed)
Chart reviewed, routed to [facility] team for eligiblity review and outreach.     Marki Frede BSN RN  Clinical Care Manager  [facility]  T:[phone]  F: [phone]

## 2017-11-17 NOTE — Anesthesia Case Conclusion (Signed)
CASE CONCLUSION  Emergence  Criteria Used for Airway Removal:  Adequate Tv & RR  Assessment:  Routine  Transport  Directly to: PACU  Position:  Recumbent  Patient Condition on Handoff  Level of Consciousness:  Alert/talking/calm  Patient Condition:  Stable  Handoff Report to:  RN

## 2017-11-17 NOTE — Preop H&P (Signed)
UPDATES TO PATIENT'S CONDITION on the DAY OF SURGERY/PROCEDURE    I. Updates to Patient's Condition (to be completed by a provider privileged to complete a H&P, following reassessment of the patient by the provider):    Day of Surgery/Procedure Update:  History  History reviewed and no change    Physical  Physical exam updated and no change            II. Procedure Readiness   I have reviewed the patient's H&P and updated condition. By completing and signing this form, I attest that this patient is ready for surgery/procedure.    III. Attestation   I have reviewed the updated information regarding the patient's condition and it is appropriate to proceed with the planned surgery/procedure.    Wynonia Musty, MD as of 2:40 PM 11/17/2017

## 2017-11-17 NOTE — Op Note (Addendum)
OPERATIVE NOTE    Date of Surgery:  11/17/2017  Surgeon:  Wynonia Musty, MD  Fellow: Kathee Polite, MD  First Assistant:  Malachi Pro, MD Resident  Second Assistant:  Andy Gauss, MD Resident    Pre-Op Diagnosis:    1. Dichorionic diamniotic twin gestation at [redacted]w[redacted]d  2. History of cesarean delivery x 4 (including prior T uterine incision)  3. Chronic hypertension  4. BMI 50  5. Desires permanent surgical sterilization    Anesthesia Type:  Spinal    Medications received in the OR:  Ancef 3g IV for surgical site infection prophylaxis  IV pitocin  See anesthesia record for further medications     Post-Op Diagnosis:  1. Dichorionic diamniotic twin gestation at [redacted]w[redacted]d  2. S/p cesarean delivery x 5  3. Chronic hypertension  4. BMI 50  5. Desires permanent surgical sterilization  6. Uterine dehiscence    Procedure(s) Performed:  1. Repeat low transverse cesarean section  2. Bilateral tubal ligation    Estimated Blood Loss:  800 cc  Packing:  No  Drains:  Yes, Type Foley, # 1. Removed? NO, plan for removal is POD#1  Fluid Totals:  Intakes:  5500 cc      Output:  300 cc  Specimens to Pathology:  Yes placentas, Twin A with cord clamp attached  Patient Condition:  good    Indications:   Patient is a 32 y.o. [id number] female with history of 4 prior cesarean deliveries, the last cesarean complicated by dense intraabdominal adhesions necessitating T uterine incision with total blood loss of 2 liters. Given prior T uterine incision, delivery at 37 weeks was recommended. She presented to L&D at [redacted]w[redacted]d with severe range BPs in the setting of chronic hypertension. She did not have evidence of superimposed pre-eclampsia. She was observed overnight and then had scheduled repeat cesarean section at [redacted]w[redacted]d. She requested permanent surgical sterilization with DSS paperwork signed > 30 days prior to admission for surgery.      Operative Findings:  Thickened fascia with adhesions to underlying rectus abdominus muscles. Omental  adhesions to anterior abdominal wall at the midline and the LLQ. Thickened bladder adhesions to LUS.     Upon entry into abdominal cavity, complete uterine dehiscence noted measuring ~ 2 x 3 cm at the right lateral aspect of the mid-uterus extending to the round ligament. Amniotic sac was completely exposed.     Otherwise normal appearing uterus. Scarring of right fallopian tube to right lateral uterine vessels. Normal appearing left fallopian tube. Normal appearing ovaries bilaterally.     Description of Procedure:   The patient was taken to the operating room and placed on the operating room table. A stop check was performed confirming the patient and the procedure to be completed. Fetal heart tones were noted to be in the 130s (twin A) and 150s (twin B).  Spinal anesthesia was administered without difficulty, and the patient was positioned in the left lateral tilt position.     The pubic hair was clipped near the intended Pfannenstiel incision.  A foley catheter was inserted using sterile technique. A traxi pannus retractor was applied. The patient's perineum and vagina were prepped with chlorhexidine. The patient's abdomen was prepped with chloraprep which was allowed to fully dry for 3 minutes prior to draping in the standard fashion.  The surgical pause was completed. A Pfannenstiel skin incision was made and carried down sharply to the fascia. The fascia was nicked in the midline and the incision was extended transversely.  The fascia was dissected off the rectus muscle using sharp and blunt dissection. Peritoneum was entered bluntly with omental adhesions noted at area of entry. Palpation demonstrated that omental adhesions were present at anterior abdominal wall throughout left aspect of abdomen. Palpation demonstrated no adhesions on the right side of the abdomen. A right Maylard incision was performed. Omental adhesions were then cauterized on the left. The bladder blade was placed to protect the bladder.      At the level of the uterine dehiscence, a low transverse uterine incision was made and extended transversely by cephalo-caudad stretching.  The amniotic fluid was noted to be clear at the time of rupture. There was delivery of a viable female infant in the double footling breech position.  The infant's mouth and nares were bulb suctioned on the abdomen. The infant's cord was clamped and cut, and the infant was passed to the awaiting personnel. Intrauterine exam revealed fetal vertex presenting. The amniotic fluid was noted to be clear at the time of rupture. There was delivery of a viable female infant in vertex position.  The infant's mouth and nares were bulb suctioned on the abdomen. The infant's cord was clamped and cut, and the infant was passed to the awaiting personnel.The placentas were then delivered manually x 2 with cord clamp applied to placenta from twin A.     The uterus was then exteriorized and cleared of all clots and debris. The uterine incision was re-approximated with interlocking suture of 0 Vicryl followed by a second imbricating layer of 0 Vicryl.    Attention was turned to fallopian tubes. Given extremely engorged adjacent vasculature, tubal excision was not performed due to concerns about disturbing these vessels. Bilateral filshie clips were placed at the midsegment of the left and right fallopian tubes. Clips were noted to extend over the entire tubular lumen.      The uterus was then placed back into the abdominal cavity and was inspected with good hemostasis noted. The abdominal cavity was carefully inspected for any bleeding. Two figure-of-eight suture of 0 vicryl were placed at the right subfascial edge near the rectus abdominus which was s/p Maylard incision. Good hemostasis was again noted. A wound sweep was performed noting no retained instruments or sponges.    Attention was then turned to the fascia, which was re-approximated with a running suture of 0 Vicryl. The subcutaneous  layer was hemostatic. The subcutaneous layer was re-approximated with running suture of 0 plain gut. The skin was re-approximated with Quill. A sterile dressing was applied.     The patient tolerated the procedure well, and was taken to the Post Anesthesia Care Unit in stable and satisfactory condition.   All sponge, needle and instrument counts were correct at the end of the procedure x2. The foley was draining yellow urine at the end of the procedure.     Dr Onnie Boer was present for entire procedure.    Kathee Polite, MD  Maternal-Fetal Medicine Fellow, PGY5      Ob/Gyn Attending Addendum    I was present throughout the entire procedure. Repeat low transverse cesarean section and bilateral tubal ligation performed in the setting of dichorionic twins. Extensive fascial and intra-abdominal adhesions noted as detailed above. Right lower uterine dehiscence noted.     Wynonia Musty, MD

## 2017-11-17 NOTE — Anesthesia Procedure Notes (Signed)
---------------------------------------------------------------------------------------------------------------------------------------    AIRWAY   GENERAL INFORMATION AND STAFF    Patient location during procedure: OR       Date of Procedure: 11/17/2017 3:26 PM  CONDITION PRIOR TO MANIPULATION     Current Airway/Neck Condition:  Normal        For more airway physical exam details, see Anesthesia PreOp Evaluation  AIRWAY METHOD     Patient Position:  Sniffing    Preoxygenated: yes      Induction: Not needed  Mask Difficulty Assessment:  0 - not attempted    Number of Attempts at Approach:  1    Number of Other Approaches Attempted:  0  FINAL AIRWAY DETAILS    Final Airway Type:  Nasal cannula    Head position required to avoid obstruction:  Neutral    Insertion Site:  Left naris and right naris  ----------------------------------------------------------------------------------------------------------------------------------------

## 2017-11-17 NOTE — Anesthesia Procedure Notes (Addendum)
---------------------------------------------------------------------------------------------------------------------------------------    NEURAXIAL BLOCK PLACEMENT  Single Shot Spinal    Date of Procedure: 11/17/2017 3:25 PM    Patient Location:  OR    Reason for Block: intraoperative anesthetic    CONSENT AND TIMEOUT     Consent:  Obtained per policy    Timeout: patient identified (name/DOB) , proper patient position verified, needed equipment, monitors, medications and access verified as present and functioning , allergies reviewed with patient/record  and anticoagulation/antiplatelet status reviewed  METHOD:    Patient Position: sitting    Monitoring: blood pressure and continuous pulse oximetry    Sedation Used: not needed            For medications used, please see MAR    Level of Sedation: none      Prep: aseptic technique per protocol, povidone-iodine and patient draped      Successful Approach: midline    Successful Location: L3-4    Attempts (Skin Punctures):  1  SPINAL NEEDLE:      Introducer: 20 G    Type: Pencan    Gauge: 25 G    Length: 3.5 in    CSF Appearance: clear  OBSERVATIONS:    Block Completion:  Successfully completed    Sensory Levels: bilateral    Epidural sensory level: T2.    Wet Tap: Yes      Paresthesia: none  Neuraxial Blood: blood not aspirated      Motor Block: dense    Patient Reaction to Block: tolerated procedure well, vitals remained stable and fetal stability  STAFF     Performed by: extender under direct supervision    Attending Attestation: I was present for the entire procedure     Attending: Malena Catholic  Extender: Granville Lewis  ----------------------------------------------------------------------------------------------------------------------------------------

## 2017-11-17 NOTE — Progress Notes (Addendum)
ANTEPARTUM INPATIENT PROGRESS NOTE       LOS: 1 day       Subjective   Jenna [name] is feeling well this morning. Denies chest pain, shortness of breath, nausea or vomiting. Denies RUQ pain, headaches or changes in vision. Denies leakage of fluid, vaginal bleeding, or contractions. Endorses good fetal movement.       Objective     Maternal vital signs  Vitals:    11/16/17 2326 11/17/17 0018 11/17/17 0207 11/17/17 0600   BP: (!) 154/72 (!) 144/79 130/61 (!) 148/70   Pulse: 74 75 68 78   Resp: _0 Temp: 36.8 C (98.2 F)  36.4 C (97.5 F)    TempSrc: Temporal  Temporal    SpO2: 99%      Weight:       Height:           Fetal vital signs  Baseline FHR: 145 bpm (11/16/17 1449)  Doppler Rate: 130 BPM (11/17/17 0207)    Physical exam  General: NAD, well-appearing  Mental Status: Alert and oriented x 3  Cardiovascular: RRR  Respiratory: Clear to auscultate  Abdomen: Soft, without fundal tenderness  Neurological: Normal, average response (2+)  Extremities/Skin: Minimal edema of pedal and pretibial (1+)    Laboratory Evaluation  Blood counts    Recent Labs  Lab 11/16/17  1646   WBC 9.1   Hemoglobin 9.0*   Hematocrit 29*   Platelets 202       No components found with this basename: NEUTOPHILPCT, MONOPCT,  EOSPCT Metabolic panel    Recent Labs  Lab 11/16/17  1646   Sodium 139   Potassium 4.1   Chloride 102   CO2 24   UN 6   Creatinine 0.76   GFR,Caucasian 104   Glucose 107*   Calcium 9.3      Hepatobiliary testing    Recent Labs  Lab 11/16/17  1646   Total Protein 6.3   Albumin 3.6   AST 24   ALT 17   Bilirubin,Total 0.2   Alk Phos 205*     Coagulopathy testing  No results for input(s): PTI, INR, PTT in the last 168 hours.   GBS Testing  Lab Results   Component Value Date/Time    GBS Streptococcus agalactiae (Group B) detected 10/10/2012 10:50 AM    Urinary protein excretion  _1 @  Urine spot: 0.12       Assessment & Plan     Jenna [name] is a 32 y.o. [id number] at [id number] twin gestation admitted with severe range  blood pressure in setting of chronic hypertension.    Chronic hypertension  - Severe range blood pressure   - Nifedipine 30 mg XL daily   - HELLP labs normal, spot 0.12  - Baby ASA (patient not compliant)  - nifed XL 316mdaily (9/4- )     History of cesarean delivery, T incision with 4th c/s  - Plan for c-section today   - DSS signed 07/20/17  - Scheduled 9/5     Postpartum planning                 - Rh positive / Rubella immune /HIV negative / GBS unknown                 - Infant: female x 2, desires circs                 - Feeding: breast and bottle                 -  PPBC: bilateral tubal ligation                 - Immunizations: No Tdap in pregnancy     Idelia Salm, MD  OB/GYN Resident, [place]  Pager # [phone]

## 2017-11-17 NOTE — Discharge Instructions (Signed)
POSTPARTUM CESAREAN DELIVERY INSTRUCTIONS    General Activity:  On the day of discharge, go home and rest. Increase activity such as walking and stair climbing gradually. No strenuous activity or heavy lifting should be done for several weeks. Do not drive for 1-2 weeks after your delivery. It is easy to have an accident when you are tired or not moving as quickly due to pain.  You also should not drive for as long as you are taking narcotic pain medication.  Travel is fine after 2 weeks, but if on a long trip, get out and walk every 2 hours to maintain circulation. Often your feet and ankles will become swollen during the week after delivery. If you had IV fluids, you may swell for several weeks.      We recommend pelvic rest (nothing in the vagina) for at least 2 weeks, until your bleeding stops, or as otherwise instructed by your physician. This means no sexual intercourse, tampons, douching or deep baths.  Sitz bathes are permitted (a few inches of warm water only in the tub).     Emotional Changes: It is normal to experience some mild sadness or emotional swings in the first week or two after delivery. If your symptoms do not get better after 2 weeks or you are experiencing worsening sadness, mood swings, thoughts of hurting yourself or others, including your baby, please call us right away. Postpartum depression is real and treatable, but it is easier to treat the earlier it is detected.    Employment:  You may return to work 6-8 weeks after delivery. This is standard [place].      Incisional Care:  Keep your incision clean and dry.  You should let the warm soapy water fall over your incision in the shower.  You should not scrub the area.  If your incision is under your abdomen, gently lift your abdomen to allow the water into the area.  Pat dry with a towel when you get out of the shower or dry with a hairdryer on COOL only.  If you have Steri-strips (tape bandages) over your incision, they should be  removed after one week if they haven't fallen off on their own.  If they become dirty or wet, they can be removed earlier.  Neosporin and other products/creams should not be applied to the incision until directed by your physician.    If you notice redness, swelling, increased pain, or drainage from the incision, call your provider immediately.  It can sometimes be difficult to see your incision postpartum.  You may need a friend/family member or a mirror to see your incision.  Look at your incision daily at least for the first 2 weeks following your delivery.      Hemorrhoids:  Keep the perineal area clean with baths, showers or Sitz baths. Anesthetic numbing spray or similar ointment is available over the counter and can be applied as needed to stitches. If you have lacerations, they will heal within the next few weeks and the stitches will dissolve on their own. Preparation H, Anusol and Tucks are fine for hemorrhoids.     Breast Care:    If you are breastfeeding, remember that it is a new learning experience for you and your baby. Breastfeeding can come very easily for some new moms and for others it’s ok if it needs patience and perseverance. Our lactation consultants have already given you lots of information, and here are a few specific tips to help   you take care of your breasts:   - Wear a comfortable, supportive bra without underwires   - Take good care of yourself so you have lots of time and energy to bond with and feed   your baby. Try to rest as much as possible in between feedings.    - Make sure you have a comfortable, deep latch every time you feed your baby   - If your breasts become engorged, you can gently massage them, take a warm shower   or bath, use warm or cold packs for comfort, or even take some ibuprofen.    - For sore or dry nipples, try to use a little breast milk around your nipple and areola   (pigmented area around breasts) after feeding. Silicone nipple soothing pads or   cool packs  can also help. If your nipples are very sore, contact your healthcare   provider as we may need to help you find a more comfortable latch.    - If you feel stringy full areas in your breasts, you may have some clogged ducts (feel like   stringy clumps). These can be helped by massage and a warm shower or wash   cloth on the breast before you nurse.    For women who are breastfeeding or pumping breastmilk, [facility] is here for help and support when you need it most!    Check out our website: [email or web address]    To speak with a lactation consultant by phone, please call [phone] (WARM LINE) and someone will return your call within 24 hours.    Call [facility] at [phone] if you need to make an appointment with a provider or lactation consultant specializing in breastfeeding.    Patients are seen in the Breastfeeding Medicine offices at [address]. For patients using [facility] ([facility]) for their pediatrician, services are also available in the Ambulatory building at [place] (6th floor).       For parents who are breastfeeding or pumping and want to meet up with other parents for support and help, come to the [facility]!   FREE drop-in support   No appointment needed!    Share and connect with other breastfeeding women   Get support from lactation specialists:  • [facility] @[facility] - [address]--  on the 1st and 3rd Wednesday of every month from 1-3 pm.   • [facility] @[facility]--[address]--  on 2nd Tuesday of every month from 10:30am -12 noon  • [facility]  [address], [place]--Wednesdays from 10-11:30 am  More info and locations at [email or web address]       Breast infections can occur whether you are nursing or not. Symptoms include fever, chills, and painful areas on your breast. Usually  antibiotics are needed, so call us during the day with your pharmacy number. Gentle massage can help as well. If you are nursing, keep nursing while you contact your provider. If you need to use pain medications other than what we list in these instructions, check with your OB provider or pediatrician's office to determine if they are safe for breastfeeding.    If you are feeding formula only, a snug bra can help to prevent pain and engorgement. If you do get pain or swelling, you can apply ice packs to your breasts and take Ibuprofen. You may need to do this   for several days. We do not use medication of any kind to "dry up" your breast milk.    Abdominal Cramps:  "After birth pains" can be severe, especially with nursing and after the birth of the second or more child. They rarely last more than 4 days.    Pain Medication: Ibuprofen or acetaminophen are both safe to take while nursing and are usually effective at managing cramps.  Narcotic pain medication (Oxycodone, Percocet, Dilaudid) may be needed for pain not relieved with over-the-counter medications.  If you are taking a narcotic with Acetaminophen in it, do not take any additional Tylenol.  Narcotic pain medication can cause lightheadedness/dizziness, nausea/vomiting, confusion, sleepiness, and constipation.  Avoid taking these medications when home alone with the infant until you know how you will respond to them.  Do not drive when taking these medications.  You should take stool softners (Colace, Miralax) while taking these medications.    Vitamins and Iron:  Continue the vitamins throughout nursing or for 1 month if not nursing. Continue iron for 1 month.    Bathing:  Showers and shallow baths are fine. No douching. Deep baths are also not recommended.    Constipation:  Stool softeners such as Colace, Metamucil, Senokot, Miralax, or generic are fine to use for mild constipation. Use milk of magnesia as necessary for persistent constipation.        Menstruation:  Bleeding is expected for several weeks. It may be red, blackish with clots. It changes to pink and then yellow-gray.  Frequently, the discharge becomes red again. If the bleeding gets heavy again, you need to rest more.     The return of a real menstrual period varies. For non-nursing mothers, it is usually between 4-10 weeks. For nursing mothers, it can take over a year.  Several months of periods are usually required before cycles become regular. Bleeding may increase after you go home, due to increased activities.     Sexual Intercourse:  It takes varying times for pain from vaginal tears/episiotomies, hemorrhoids and swelling to go away so that you are comfortable. We recommend not resuming intercourse for at least 2 weeks, until your bleeding stops, or as otherwise instructed by your physician.   Nursing causes vaginal dryness and lubrication is often required. Remember, the possibility of becoming pregnant exists, even while nursing. You ovulate 2 weeks before the return of the first period, so you may become pregnant before your first period.      Contraception:  We recommend at least 18 months between pregnancies for you and your baby's health. Even if you are breastfeeding, you can become pregnant if you are not using a reliable and consistent form of contraception. We do not recommend using estrogen-containing products for the first several weeks after delivery due to the elevated risk of blood clots or while breastfeeding. Progesterone-only products, however, are perfectly safe immediately postpartum and do not interfere with breastfeeding.     Postpartum Visit:  Call our office to schedule an appointment. This visit will be scheduled 6 weeks after you delivered.  Your physician may ask you to schedule an incision check in the office one week following delivery.  This will be specified on your discharge paperwork.      Call if the following occur:   1. Fever of 100.4 degrees F or severe  chills. Check your temperature before calling, if possible. You may feel hot but you cannot determine if you have a true fever without a thermometer.      2. Excessively heavy vaginal bleeding.   3. Redness, swelling, or drainage from your incision.   4. Extreme urinary frequency and burning with urination.   5. Swelling or tenderness in one area of the breast.   6. Marked depression or anxiety.    7. Severe pain not improved with pain medication.   8. Chest pain or shortness or breath.   9. Significant swelling in one leg more than the other.   10. Persistent headache or visual changes.   11. Any other questions or concerns.      Please refer to your [facility] packet for any other questions not addressed here.

## 2017-11-17 NOTE — Progress Notes (Signed)
Pt transferred via stretcher from [phone] to [id number]. Oriented to room, unit, and call bell. Patient requesting to go to NICU. OOB to wheelchair with stand by assist. Patient to NICU with husband.

## 2017-11-17 NOTE — Anesthesia Postprocedure Evaluation (Signed)
Anesthesia Post-Op Note    Patient: Jenna [name]    Procedure(s) Performed:  Procedure Summary  Date:  11/17/2017 Anesthesia Start: 11/17/2017  2:45 PM Anesthesia Stop: 11/17/2017  6:20 PM Room / Location:  S_[id number]-A / [facility] L&D   Procedure(s):  C-SECTION WITH TUBAL LIGATION Diagnosis:  H/O cesarean section [Z98.891]  Dichorionic diamniotic twin pregnancy in third trimester [O30.043] Surgeon(s):  Malachi Pro, MD  Margart Sickles, MD  Marcine Matar, MD  Sherlyn Lick, MD Attending Anesthesiologist:  Malena Catholic, MD,PhD         Recovery Vitals  BP: 132/63 (11/17/2017  9:30 PM)  Heart Rate: 75 (11/17/2017  9:30 PM)  Resp: 16 (11/17/2017  9:30 PM)  Temp: 36.5 C (97.7 F) (11/17/2017  9:30 PM)  SpO2: 98 % (11/17/2017  7:30 PM)   0-10 Scale: 0 (11/17/2017  9:30 PM)  Anesthesia type:  Spinal  Complications Noted During Procedure or in PACU:  None   Comment:    Patient Location:  PACU  Level of Consciousness:    Recovered to baseline  Patient Participation:     Able to participate  Temperature Status:    Normothermic  Oxygen Saturation:    Within patient's normal range  Cardiac Status:   Within patient's normal range  Fluid Status:    Stable  Airway Patency:     Yes  Pulmonary Status:    Baseline  Neuraxial Block Evaluation:    Block resolving  Pain Management:    Adequate analgesia  Nausea and Vomiting:  None    Post Op Assessment:    Tolerated procedure well"   Attending Attestation:  All indicated post anesthesia care provided       -

## 2017-11-18 ENCOUNTER — Encounter: Payer: Self-pay | Admitting: Anesthesiology

## 2017-11-18 LAB — CBC
Hematocrit: 26 % — ABNORMAL LOW (ref 34–45)
Hemoglobin: 7.7 g/dL — ABNORMAL LOW (ref 11.2–15.7)
MCH: 25 pg/cell — ABNORMAL LOW (ref 26–32)
MCHC: 30 g/dL — ABNORMAL LOW (ref 32–36)
MCV: 85 fL (ref 79–95)
Platelets: 180 10*3/uL (ref 160–370)
RBC: 3.1 MIL/uL — ABNORMAL LOW (ref 3.9–5.2)
RDW: 14.7 % — ABNORMAL HIGH (ref 11.7–14.4)
WBC: 10.8 10*3/uL — ABNORMAL HIGH (ref 4.0–10.0)

## 2017-11-18 MED ORDER — FERROUS SULFATE 325 (65 FE) MG PO TABS *WRAPPED* *I*
325.0000 mg | ORAL_TABLET | Freq: Every day | ORAL | Status: DC
Start: 2017-11-18 — End: 2017-11-19
  Administered 2017-11-18 – 2017-11-19 (×2): 325 mg via ORAL
  Filled 2017-11-18 (×2): qty 1

## 2017-11-18 MED ORDER — LACTATED RINGERS IV BOLUS *I*
500.0000 mL | Freq: Once | INTRAVENOUS | Status: AC
Start: 2017-11-18 — End: 2017-11-18
  Administered 2017-11-18: 500 mL via INTRAVENOUS
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
## 2017-11-18 NOTE — Progress Notes (Addendum)
OBSTETRICS CESAREAN DELIVERY POST-OP PROGRESS NOTE   Postpartum Day: 1    Subjective     Jenna [name] is ready for her pain medication, but doing well. She just ate a burger.  Pain is moderately well-controlled with current regimen.  Tolerating regular diet without nausea/vomiting.  Flatus absent. She  has ambulated.  Denies chest pain, SOB, or lightheadedness.  Appropriate vaginal bleeding.  Babies are female, in NICU.      Objective     Vitals:    11/18/17 0100 11/18/17 0200 11/18/17 0300 11/18/17 0400   BP:    116/56   BP Location:       Pulse:    65   Resp: 16 17 16 18    Temp:    37.3 C (99.1 F)   TempSrc:    Axillary   SpO2:       Weight:       Height:           Urine output: 525 recorded last 24 hours, specifically 160cc over last seven hours    Mental Status: Alert and oriented x 3  Cardiovascular: Regular rate and rhythm with no murmurs  Respiratory: Clear to auscultate  Abdomen: Soft, appropriately tender, nondistended, +BS  Incision: dressing in place  Fundus: Fundus firm at umbilicus  Neurological: no focal deficits  Extremities/Skin: No edema noted      Labs       Recent Labs  Lab 11/16/17  1646   Hematocrit 29*       ABO RH Blood Type (no units)   Date Value   11/16/2017 A RH POS                  Rubella IgG AB (no units)   Date Value   07/22/2017 POSITIVE           Assessment & Plan     Jenna [name] is a 32 y.o. [id number] on POD# 1 s/p       Jenna [name], Jenna [name] [[id number]]   C-Sec, Low Transverse     Jenna [name], Jenna [name] [[id number]]   C-Sec, Low Transverse   at [redacted]w[redacted]d for di-di twin gestation, history of cesarean delivery x 4 including prior T uterine incision and desire for permanent surgical sterilization.  Doing well.    Routine postoperative care:  - Pain management: tylenol, toradol 30mg  q6h  - DVT Prophylaxis: SCDs. Early ambulation.  - UOP: inadequate. Foley in place, slight amber tint to urine. 500cc LR bolus ordered.  - FEN: Regular diet.    - PPHct as above. Ferrous sulfate is indicated on discharge.    Dichorionic diamniotic  twin gestation                 - Concordant growth    Chronic hypertension                 - No meds                 - Severe range BP on admission   - Normal range BPs overnight    History of 4 prior cesarean sections                 - Most recent repeat C/S was T-incision with dense fascial and bladder adhesions, EBL 2 liters    History of cocaine abuse    Class III obesity    Depression                 -  No meds    Asthma                 - Moderate persistent                 - Albuterol PRN                 - Symbicort    Postpartum care:  - Rh status as above. Rhogam is not indicated.    - Infants: female. Desires circumcision.  - Feeding: breast and bottle  - PPBC: tubal ligation done  - Immunizations: TdAP ordered prior to discharge.    Disposition: Plan for D/C home POD # 3.    Melvyn Neth, MD MPH  OBGYN PGY1  [phone]
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
## 2017-11-18 NOTE — Plan of Care (Signed)
C-Section Postpartum Care    • Vital signs are medically acceptable Progressing towards goal    • Respiratory - Lungs clear to auscultation Progressing towards goal    • Cardiovascular - Homan's sign negative Progressing towards goal    • Dressing intact until removed with any drainage marked Progressing towards goal    • Fundus firm at midline Progressing towards goal    • Moderate rubra freeflow, no purulent discharge, no foul smelling lochia Progressing towards goal    • Urine output is 30 ml/hour or more Progressing towards goal    • Breasts are soft with nipple integrity intact Progressing towards goal    • Demonstrates appropriate breast feeding techniques Progressing towards goal    • Patient is able to void/empty bladder after catheter is removed Progressing towards goal    • Positive Mother-Baby interactions are observed Progressing towards goal    • Ambulates independently Progressing towards goal        Knowledge Deficit    • Patient/S.O. demonstrates understanding of disease process, treatment plan, medications, and discharge instructions. Progressing towards goal        Pain    • Patient's pain/discomfort is manageable Progressing towards goal    • Report decrease in pain level Progressing towards goal        Safety - OB    • Follow unit's safety guidelines Progressing towards goal
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
## 2017-11-18 NOTE — Anesthesia Postprocedure Evaluation (Signed)
Anesthesia Post-Op Note    Patient: Jenna [name]    Procedure(s) Performed:  Procedure Summary  Date:  11/17/2017 Anesthesia Start:  Anesthesia Stop:  Room / Location:  * No operating room entered * / [facility] L&D   Procedure(s):  C-SECTION Diagnosis:  Elective Surgeon(s):  Margart Sickles, MD Attending Anesthesiologist:           Recovery Vitals  BP: 127/58 (11/18/2017  8:10 AM)  Heart Rate: 86 (11/18/2017  8:10 AM)  Resp: 16 (11/18/2017  8:10 AM)  Temp: 36.5 C (97.7 F) (11/18/2017  8:10 AM)  SpO2: 98 % (11/17/2017  7:30 PM)   0-10 Scale: 0 (11/18/2017  8:30 AM)  Anesthesia type:  Anesthesia type not filed in the log.  Complications Noted During Procedure or in PACU:  No value filed.   Comment: No value filed.  Patient Location:  Med Surgical Floor  Level of Consciousness:    Recovered to baseline  Patient Participation:     Able to participate  Temperature Status:    Normothermic  Oxygen Saturation:    Within patient's normal range  Cardiac Status:   Within patient's normal range  Fluid Status:    Stable  Airway Patency:     Yes  Pulmonary Status:    Baseline  Neuraxial Block Evaluation:    No residual motor or sensory symptoms  Pain Management:    Adequate analgesia  Nausea and Vomiting:  None    Post Op Assessment:    Tolerated procedure well"   Attending Attestation:  All indicated post anesthesia care provided       -

## 2017-11-18 NOTE — Progress Notes (Signed)
Met with patient for weekend discharge planning. Patient is scheduled for her post partum follow up appointments scheduled for 9/12 and 10/17 - indicated on AVS. Patient will use [facility] pharmacy. Writer gave patient Rx and information to order her breast pump for home. Please call with questions or discharge needs. Thank you.      Loleta Books, RN  Acute Care Coordinator  Phone [phone]

## 2017-11-19 ENCOUNTER — Ambulatory Visit: Payer: Self-pay

## 2017-11-19 MED ORDER — OXYCODONE HCL 10 MG PO TABS *I*
10.0000 mg | ORAL_TABLET | Freq: Once | ORAL | Status: AC
Start: 2017-11-19 — End: 2017-11-19
  Administered 2017-11-19: 10 mg via ORAL
  Filled 2017-11-19: qty 1

## 2017-11-19 MED ORDER — TETANUS-DIPHTH-ACELL PERT 5-2.5-18.5 LF-MCG/0.5 IM SUSP *WRAPPED*
0.5000 mL | Freq: Once | INTRAMUSCULAR | Status: AC
Start: 2017-11-19 — End: 2017-11-19
  Administered 2017-11-19: 0.5 mL via INTRAMUSCULAR
  Filled 2017-11-19: qty 0.5

## 2017-11-19 NOTE — Lactation Note (Signed)
This note was copied from a baby's chart.  Lactation Consultant Daily Visit   Patient: Jenna [name] "Jenna [name]"        Age: 32 days              Corrected GA: 37w 6d  MRN: [id number]      Maternal Information   Mothers Name:   Jenna [name]    Visit Type: routine/daily     Maternal Medications: current maternal medications compatible with breastfeeding     Breast Assessment: no maternal concerns    Nipple Assessment: no maternal concerns    Newborn Assessment   Infant Weight:      Birth Weight: 3040 g (6 lb 11.2 oz)     Today's Weight: 2950 g (6 lb 8.1 oz)     Percent Weight Change: -2.96 %        Oral Assessment: unable to assess    Maternal and Newborn Individual Risk Factors impacting Breastfeeding:   Infant separated from mother    Interventions and Instructions   Mom at bedside in a quick hurry to leave because her ride was here.  Writer confirmed with mom that she had a home pump.  She noted that she was getting it delivered today.  She stated she would be back.  Writer discussed the importance of pumping 8 times a day to encouraged her milk to transition in.     Plan   Follow-up with lactation daily while mother/infant dyad inpatient or by maternal request.  Length of this call/visit: 5-10 minutes     Curt Jews, RN  Lactation Consultant

## 2017-11-19 NOTE — Plan of Care (Signed)
C-Section Postpartum Care    • Vital signs are medically acceptable Maintaining    • Respiratory - Lungs clear to auscultation Maintaining    • Cardiovascular - Homan's sign negative Maintaining    • Dressing intact until removed with any drainage marked Maintaining    • Fundus firm at midline Maintaining    • Moderate rubra freeflow, no purulent discharge, no foul smelling lochia Maintaining    • Urine output is 30 ml/hour or more Maintaining    • Breasts are soft with nipple integrity intact Maintaining    • Demonstrates appropriate breast feeding techniques Maintaining    • Patient is able to void/empty bladder after catheter is removed Maintaining    • Positive Mother-Baby interactions are observed Maintaining    • Ambulates independently Maintaining        Knowledge Deficit    • Patient/S.O. demonstrates understanding of disease process, treatment plan, medications, and discharge instructions. Maintaining        Pain    • Patient's pain/discomfort is manageable Maintaining    • Report decrease in pain level Maintaining        Safety - OB    • Follow unit's safety guidelines Maintaining

## 2017-11-19 NOTE — Progress Notes (Signed)
OBSTETRICS CESAREAN DELIVERY POST-OP PROGRESS NOTE   Postpartum Day: 2    Subjective     Jenna [name] is feeling sore this morning. Pain is moderately well-controlled with current regimen.  Tolerating regular diet without nausea/vomiting.  Does not report flatus yet. She  has ambulated.  Denies chest pain, SOB, or lightheadedness.  Appropriate vaginal bleeding.  Babies are female, in NICU, and will be there for one week.    Patient requests discharge this am due to childcare difficulties.       Objective     Vitals:    11/18/17 1720 11/18/17 2059 11/19/17 0015 11/19/17 0031   BP: (!) 152/86 131/66  126/60   BP Location:  Right arm  Left arm   Pulse: 102 98  87   Resp: 18 18 18 18    Temp: 36.8 C (98.2 F) 36.6 C (97.9 F)  36.4 C (97.5 F)   TempSrc: Temporal Temporal  Temporal   SpO2:       Weight:       Height:           Urine output: 1400cc recorded over last 24h    Mental Status: Alert and oriented x 3  Cardiovascular: Regular rate and rhythm with no murmurs  Respiratory: Clear to auscultate  Abdomen: Soft, appropriately tender, nondistended, +BS  Incision: clean, dry, intact  Fundus: Fundus firm at umbilicus  Neurological: no focal deficits  Extremities/Skin: No edema noted      Labs       Recent Labs  Lab 11/18/17  0711 11/16/17  1646   Hematocrit 26* 29*       ABO RH Blood Type (no units)   Date Value   11/16/2017 A RH POS                    Rubella IgG AB (no units)   Date Value   07/22/2017 POSITIVE           Assessment & Plan     Jenna [name] is a 32 y.o. [id number] on POD# 2 s/p       Jenna [name], Jenna [name] [[id number]]   C-Sec, Low Transverse     Jenna [name], Jenna [name] [[id number]]   C-Sec, Low Transverse   at [redacted]w[redacted]d for di-di twin gestation, history of cesarean delivery x 4 including prior T uterine incision and desire for permanent surgical sterilization.  Doing well.    Routine postoperative care:  - Pain management: tylenol, toradol 30mg  q6h  - DVT Prophylaxis: SCDs. Early ambulation.  - UOP: adequate, patient voiding spontaneously.  - FEN:  Regular diet.    - PPHct as above. Ferrous sulfate is indicated on discharge.    Dichorionic diamniotic twin gestation                 - Concordant growth    Chronic hypertension                 - No meds                 - Severe range BP on admission   - Normal range BPs overnight    History of 4 prior cesarean sections                 - Most recent repeat C/S was T-incision with dense fascial and bladder adhesions, EBL 2 liters    History of cocaine abuse    Class III obesity    Depression                 -  No meds    Asthma                 - Moderate persistent                 - Albuterol PRN                 - Symbicort    Postpartum care:  - Rh status as above. Rhogam is not indicated.    - Infants: female. Desires circumcision.  - Feeding: breast and bottle  - PPBC: tubal ligation done  - Immunizations: TdAP ordered prior to discharge.    Disposition: Plan for D/C home POD # 3. Early discharge pending attending evaluation.    Melvyn Neth, MD MPH  OBGYN PGY1  [phone]

## 2017-11-19 NOTE — Discharge Summary (Signed)
Name: Jenna [name] MRN: [id number] DOB: 11/05/1985     Admit Date: 11/16/2017   Date of Discharge: 11/19/2017     Patient was accepted for discharge to   Home or Self Care [1]       Discharge Attending Physician: Marylene Buerger, MD    Hospitalization Summary    CONCISE NARRATIVE: Jenna [name] is a 32 y.o. [phone] now [id number] who presented with decreased fetal movement and elevated blood pressures in the setting of di-di twin pregnancy, chronic hypertension and history of 4 prior c-sections, obesity, asthma, and depression. She was admitted for blood pressure monitoring and was started on antihypertensive therapy. The next day she delivered two viable female infants by repeat Cesarean section with BTL on 11/17/2017. Her postpartum course was uncomplicated and she was discharged home when meeting all milestones with a follow up outpatient for postpartum visit.        OR PROCEDURE: 11/17/2017: Repeat low transverse cesarean section + BTL                    Signed: Marvene Staff, MD  On: 11/19/2017  at: 9:24 AM

## 2017-11-19 NOTE — Lactation Note (Signed)
This note was copied from a baby's chart.  Department of Lactation and Breastfeeding Medicine  Congratulations on the birth of your baby and your decision to provide the benefits of breast milk.    Birth Weight: 3040 g (6 lb 11.2 oz)   Discharge: Weight: 2950 g (6 lb 8.1 oz)    Weight Change: -3% from BW    Below are some instructions to maintain and increase your milk supply after you are discharged home and your baby is still in the NICU.    Pumping: You should continue to pump at least 8 times in a 24 hour period for 20 minutes on the highest suction wtihout pain.  Because you will be using your pump to tell your body how much milk to make- changing your pump routine a couple of times each week will help you maintain the supply you need to keep your baby growing.  Power Pumping is also suggested once or twice weekly.      Power Pumping is when you pump 10 minutes on and off for just one hour. This is in addition to your 8 pumps daily.  This will increase your production to meet the needs of your baby's growing demands!    Skin to Skin is such a wonderful part of having a baby!  When you are visiting your baby in the NICU- you can enjoy the benefits of holding your baby skin to skin.  It helps calm babies, maintain your milk supply, and helps to keep the baby warm.      Please call our Lactation Team with any breastfeeding concerns, our phone numbers are listed below.  As always we are available during the day to chat in person while you are visiting your baby in the NICU.      Milk Storage:  Countertop, Table Room temperature up to 77 F 4   hours Containers should be covered and kept as cool as possible   Insulated cooler bag 5-39 F 24 hours Keep freezer packs in contact with milk containers, limit opening cooler bag   Refrigerator 39 F 5 days Store milk in back of the main body of the refrigerator   Freezer compartment of a refrigerator 5 F 2 weeks Store milk toward the back of the freezer where temperature is  most constant.     Freezer compartment of a refrigerator with separate doors 0 F 3-6 months Milk stored for longer durations is safe but some of the lipids (fat) in the milk can degrade resulting in lower quality milk.   Chest or upright deep freezer -4 F 6-12 months    Reference: Academy of Breastfeeding Medicine (2004)      Follow up: After discharge you may call with breastfeeding or pumping concerns and leave a message at [phone]. A lactation consultant will return your phone call within 24 hours.  [facility] [phone]) [email or web address]   Curt Jews, RN  Lactation Consultant

## 2017-11-21 ENCOUNTER — Telehealth: Payer: Self-pay | Admitting: Internal Medicine

## 2017-11-21 ENCOUNTER — Telehealth: Payer: Self-pay

## 2017-11-21 ENCOUNTER — Telehealth: Payer: Self-pay | Admitting: Obstetrics and Gynecology

## 2017-11-21 NOTE — Telephone Encounter (Signed)
Error

## 2017-11-21 NOTE — Telephone Encounter (Signed)
Call returned to patient - she delivered twins via c-section 11/17/2017. She is calling with excruciating pain of 8/10 in her incision site. She denies fever/chills, states the incision is clean, dry, intact. Reports eating and drinking well, had BM yesterday. She reports taking Ibuprofen and Roxicodone around the clock when due, but "it is not working".     Patient plans to go to the NICU today to visit her babies. She voices her intent to stop into OB triage while in the hospital for evaluation of her pain.

## 2017-11-21 NOTE — Telephone Encounter (Signed)
Pt will follow up with OB.

## 2017-11-21 NOTE — Telephone Encounter (Signed)
PT called in requested a doctor or nurse call her back because she is in "excurciating pain" at her c-section site and needs better pain management.     FYI: [facility] nurse was also paged.

## 2017-11-22 LAB — SURGICAL PATHOLOGY

## 2017-11-23 ENCOUNTER — Encounter: Payer: Self-pay | Admitting: Obstetrics and Gynecology

## 2017-11-23 NOTE — Progress Notes (Deleted)
[facility] Post[facility] Visit    HPI  Jenna [name] is a 32 y.o. [phone] female who is 1 week postpartum from a repeat low transverse Cesarean section of di-di twins and BTL on 11/17/17 at 37 weeks 4 days. Intrapartum course was complicated by [facility] with severe range BP on admission but no e/o superimposed pre-eclampsia. Postpartum course was uncomplicated. She presents today for incision and BP check.      MEDICATIONS  Prior to Admission medications    Medication Sig Start Date End Date Taking? Authorizing Provider   acetaminophen (TYLENOL) 325 MG tablet Take 2 tablets (650 mg total) by mouth every 4 hours as needed 11/17/17   Malachi Pro, MD   NIFEdipine (ADALAT CC) 30 MG 24 hr tablet Take 1 tablet (30 mg total) by mouth daily   Swallow whole. Do not crush, break, or chew. 11/18/17   Malachi Pro, MD   docusate sodium (COLACE) 100 MG capsule Take 1 capsule (100 mg total) by mouth 2 times daily 11/17/17 12/17/17  Malachi Pro, MD   ibuprofen (ADVIL,MOTRIN) 600 MG tablet Take 1 tablet (600 mg total) by mouth 3 times daily as needed for Pain 11/17/17 12/17/17  Malachi Pro, MD   oxyCODONE (ROXICODONE) 5 MG immediate release tablet Take 1 tablet (5 mg total) by mouth every 4-6 hours as needed for Pain   Max daily dose: 20 mg 11/17/17   Malachi Pro, MD   budesonide-formoterol [facility]) 160-4.5 MCG/ACT inhaler Inhale 2 puffs into the lungs 2 times daily   Shake well before each use. 09/21/17   Oretha Caprice, MD   albuterol HFA 108 (90 Base) MCG/ACT inhaler Inhale 1-2 puffs into the lungs every 6 hours as needed for Wheezing   Shake well before each use. 09/21/17   Oretha Caprice, MD   blood pressure monitor Use as directed 07/08/16   Sharol Roussel, MD       REVIEW OF SYSTEMS ***  Gen: Denies unintended weight loss, weight gain, hot flashes, night sweats, fatigue, fevers/chills  HEENT: Denies headaches, vision changes, hearing problems  CV: Denies chest pain, irregular heartbeat  Resp: Denies  wheezing, SOB, persistent cough  Breast: Denies lumps, nipple discharge, tenderness  JTT:SVXBLT nausea, vomiting, diarrhea, constipation, straining during BMs, bloody stool, fecal incontinence, bloating/gas, abdominal pain  GU: Denies incontinence, urinary urgency, urinary frequency,  dysuria, nocturia, weak urine stream, difficulty voiding, incomplete emptying, hematuria  GYN: Denies vaginal dryness, bulge from vagina, loss of sex drive  MSK: Denies joint pain, muscle aches, muscle weakness, numbness/tingling, back pain  Skin: Denies rash, hair loss  Psych: Denies depression, stress/anxiety     PP EXAM  There were no vitals filed for this visit.  General - alert, well appearing, and in no distress  Resp - clear to auscultation bilaterally and Normal respiratory effort.  CV - Normal rate, regular rhythm  Abdomen - Soft, nondistended, nontender  Incision - clean, dry and intact, healing well  Extremities - No edema, no tenderness      ASSESSMENT/PLAN  Pt is a [id number] female s/p repeat low transverse Cesarean section of di-di twins and BTL on 11/17/17, here for incision and BP check.    Chronic hypertension  - BP ***, asymptomatic  - No medications    Postop  - incision healing well    Return to the office in 5 weeks for postpartum check.    Discussed with Dr. Princess Perna M. Ladell Heads, MD  Obstetrics & Gynecology, PGY-3  Pager [phone]

## 2017-11-24 ENCOUNTER — Telehealth: Payer: Self-pay

## 2017-11-24 ENCOUNTER — Ambulatory Visit: Payer: Medicaid (Managed Care)

## 2017-11-24 ENCOUNTER — Ambulatory Visit: Payer: Self-pay

## 2017-11-24 NOTE — Telephone Encounter (Signed)
Cancelled patients appt for 2p today, attempted to contact patient to reschedule. Man answered phone stating patient in shower, stated he would have her call back once she is done.

## 2017-11-24 NOTE — Telephone Encounter (Signed)
[facility] pt calling to cancel her 1 week PPM, stated she is unable to make appointment because she will be seeing her babies in the NICU. Would like a call back to reschedule.

## 2017-11-24 NOTE — Lactation Note (Signed)
This note was copied from a baby's chart.  Lactation Consultant Daily Visit   Patient: Boy A Jenna [name] "Jenna [name]"                   MRN: [id number]      Approached mom regarding feeding preferences. Mom states that she desires to bottle/formula feed infant. Offered opportunity to ask any questions or address concerns regarding her decision. This mother denied questions about breastfeeding and declined discussing breastfeeding.  At this time, mother has chosen to formula feed her infant. Teaching sheet provided and reviewed regarding engorgement management, including with tips should she experience normal physiologic engorgement without breastfeeding/pumping. Advised to call if she has any concerns.  Available as resource if needed.      Jiles Haroldhelsea K Martin, RN  Lactation Consultant

## 2017-12-29 ENCOUNTER — Ambulatory Visit: Payer: Medicaid (Managed Care)

## 2017-12-30 ENCOUNTER — Telehealth: Payer: Self-pay

## 2017-12-30 NOTE — Telephone Encounter (Signed)
Pt delivered via C/S on 11/17/17, di/di twins.   Per pt, babies are home and " doing good."  Pt missed her PPM appointment yesterday, because she had to take her son to a physical.  Pt states that for 2 1/2 weeks , she had very heavy bleeding.  " I had to wear 2 large depends and the clotts were dropping out. "   She reports that this heavy bleeding ended 2 days ago.  Currently, she is wearing a regular pad and changes it 3-4 times/day.  Pt denies being dizzy or lightheaded.  Pt with H/O migraines and today is complaining that once a day, she is getting a very bad headache., lasts about 2-3 hours.  Ibuprofen does not help.  She gets it to go away by " putting my head under cold water."  She states this is how she handles her migraines.  She is having" aches and pain in the stomach area."   Is eating and drinking normally, having normal BMs.  Pt is bottle feeding the babies, has very little help at home.  [name] is there and will help " as much as he can, but not at  Night."  Pt agrees that she gets very little sleep.  Pt had BTL.  Writer encouraged the patient to reach out to others around  Her to assist with babies, even if just through out day so she could sleep.  She was encouraged to use Ibuprofen for aches and pains, increase her water intake.  Continue to treat migraines, as she normally does and to call [facility] if her symptoms worsen, if heavy bleeding returns.   She was encouraged to keep her PPM appointment.  Pt voiced understanding.  Will send to [facility] NP for review.

## 2017-12-30 NOTE — Telephone Encounter (Signed)
Called patient to reschedule missed PPM appt from 10-17. Patient rescheduled for 10-24 @ 1:15p.    While on the phone patient requested a call back from a nurse stating that she has been very heavily bleeding for the past three weeks and is concerned. Patient can be reached on mobile number.

## 2017-12-30 NOTE — Telephone Encounter (Signed)
Telephone encounter discussed with Mellody Memos, NP.  Agreed with plan

## 2018-01-05 ENCOUNTER — Ambulatory Visit: Payer: Medicaid (Managed Care)

## 2018-01-05 LAB — UNMAPPED LAB RESULTS
Basophil # (HT): 0.1 10 3/uL — NL (ref 0.0–0.2)
Basophil % (HT): 0 % — NL (ref 0–3)
Eosinophil # (HT): 0.2 10 3/uL — NL (ref 0.0–0.6)
Eosinophil % (HT): 2 % — NL (ref 0–5)
Hematocrit (HT): 27 % — ABNORMAL LOW (ref 35–47)
Hemoglobin (HGB) (HT): 7.7 g/dL — ABNORMAL LOW (ref 12.0–16.0)
Lymphocyte # (HT): 2.8 10 3/uL — NL (ref 1.0–4.8)
Lymphocyte % (HT): 24 % — NL (ref 15–45)
MCHC (HT): 28.8 g/dL — ABNORMAL LOW (ref 31.0–37.5)
MCV (HT): 83 fL — NL (ref 80–100)
Mean Corpuscular Hemoglobin (MCH) (HT): 24.1 pg — ABNORMAL LOW (ref 26.0–34.0)
Monocyte # (HT): 0.6 10 3/uL — NL (ref 0.1–1.0)
Monocyte % (HT): 5 % — NL (ref 0–15)
Neutrophil # (HT): 8.3 10 3/uL — ABNORMAL HIGH (ref 1.8–8.0)
Platelets (HT): 445 10 3/uL — NL (ref 150–450)
RBC (HT): 3.2 10 6/uL — ABNORMAL LOW (ref 3.80–5.20)
RDW (HT): 15.7 % — ABNORMAL HIGH (ref 0.0–15.2)
Seg Neut % (HT): 69 % — NL (ref 45–75)
WBC (HT): 12 10 3/uL — ABNORMAL HIGH (ref 4.0–11.0)

## 2018-03-06 LAB — UNMAPPED LAB RESULTS
Basophil # (HT): 0.1 10 3/uL — NL (ref 0.0–0.2)
Basophil % (HT): 1 % — NL (ref 0–3)
Eosinophil # (HT): 0.2 10 3/uL — NL (ref 0.0–0.6)
Eosinophil % (HT): 2 % — NL (ref 0–5)
Hematocrit (HT): 25 % — ABNORMAL LOW (ref 35–47)
Hemoglobin (HGB) (HT): 6.9 g/dL — ABNORMAL LOW (ref 12.0–16.0)
Lymphocyte # (HT): 3 10 3/uL — NL (ref 1.0–4.8)
Lymphocyte % (HT): 33 % — NL (ref 15–45)
MCHC (HT): 27.5 g/dL — ABNORMAL LOW (ref 31.0–37.5)
MCV (HT): 78 fL — ABNORMAL LOW (ref 80–100)
Mean Corpuscular Hemoglobin (MCH) (HT): 21.3 pg — ABNORMAL LOW (ref 26.0–34.0)
Monocyte # (HT): 0.7 10 3/uL — NL (ref 0.1–1.0)
Monocyte % (HT): 7 % — NL (ref 0–15)
Neutrophil # (HT): 5.1 10 3/uL — NL (ref 1.8–8.0)
Platelets (HT): 417 10 3/uL — NL (ref 150–450)
RBC (HT): 3.24 10 6/uL — ABNORMAL LOW (ref 3.80–5.20)
RDW (HT): 15.6 % — ABNORMAL HIGH (ref 0.0–15.2)
Seg Neut % (HT): 57 % — NL (ref 45–75)
WBC (HT): 9.1 10 3/uL — NL (ref 4.0–11.0)

## 2018-03-16 ENCOUNTER — Telehealth: Payer: Self-pay | Admitting: Internal Medicine

## 2018-03-16 DIAGNOSIS — K8050 Calculus of bile duct without cholangitis or cholecystitis without obstruction: Secondary | ICD-10-CM

## 2018-03-16 NOTE — Telephone Encounter (Signed)
Copied from CRM [id number]. Topic: Return Call - Speak to Provider/Office Staff  >> Mar 16, 2018 10:58 AM Stefanie Libel wrote:  Jenna [name] calling to report that she was seen at [facility] and told she has gallstones patient is in server pain. She also states she is having a hidradenitis suppurativa flair up (swelling in her arms and push leaking from boils with odor)  . These symptoms have been going on for 2 week(s)    Jenna [name] is also experiencing side/ stomach/ back pain.  she rates this pain a 10/10.      Patient requesting same day appointment? yes    Phone number confirmed at [phone].

## 2018-03-16 NOTE — Telephone Encounter (Signed)
Attempted to reach pt.  Provided number, no  Voicemail set up.  Will attempt to reach pt. At a later time.

## 2018-03-17 ENCOUNTER — Ambulatory Visit: Payer: Medicaid (Managed Care) | Admitting: Nurse Practitioner

## 2018-03-17 NOTE — Addendum Note (Signed)
Addended by: Sharol Roussel on: 03/17/2018 08:14 PM     Modules accepted: Orders

## 2018-03-17 NOTE — Telephone Encounter (Signed)
Called and spoke with patient. She is experiencing abdominal pain with PO intake. Patient went to the ED 12/23 at [facility] for this issue, Dx with calculus of gallbladder without cholecystitis without obstruction. Pain has been ongoing since, patient denies any new symptoms including nausea, vomiting, fever and chills. Denies worsening of pain, states pain has been the same since her ED visit.    Patient instructed to eat a bland diet, this was also discussed in the ED.     Pt was referred to general surgery. Patient stated she has not called for an appointment yet. Pt states she would rather follow up through [facility]. Patient states she is in immense pain, offered appt today at 3:20, patient declined stated she is unable to make it. Patient requested appointment in a week (1/10) Patient scheduled per request. Patient advised to call with any new or worsening symptoms.

## 2018-03-24 ENCOUNTER — Ambulatory Visit: Payer: Medicaid (Managed Care) | Admitting: Internal Medicine

## 2018-03-30 ENCOUNTER — Institutional Professional Consult (permissible substitution): Payer: Medicaid (Managed Care) | Admitting: Surgery

## 2018-04-04 ENCOUNTER — Telehealth: Payer: Self-pay | Admitting: Internal Medicine

## 2018-04-04 NOTE — Telephone Encounter (Unsigned)
Copied from CRM [id number]. Topic: Appointments - Schedule Appointment  >> Apr 04, 2018 10:42 AM Jenna [name] wrote:  Patient called to schedule and appointment.    Patient stated she is having issues with gallbladder Stones and hidradenitis  .  She insisted on schedule for next week.  Appointment is on 04/10/2018 at 2:00 PM

## 2018-04-10 ENCOUNTER — Ambulatory Visit: Payer: Medicaid (Managed Care) | Admitting: Internal Medicine

## 2018-04-13 ENCOUNTER — Institutional Professional Consult (permissible substitution): Payer: Medicaid (Managed Care) | Admitting: Surgery

## 2018-05-09 ENCOUNTER — Institutional Professional Consult (permissible substitution): Payer: Medicaid (Managed Care) | Admitting: Surgery

## 2018-05-09 NOTE — Progress Notes (Signed)
[facility] Progress Note    Chief Complaint:    Chief Complaint   Patient presents with    Hypertension            HPI      Jenna [name] is a 33 y.o. female with a PMHx significant for recent desired pregnancy, cocaine abuse (tested positive 06/02/16), depression, tobacco abuse, obesity, and hidradenitis who presents for follow-up of HTN.    Hydranitis  - Longstanding problem. "I've had it for years."  - Located in axillae, under breasts, and in groin.  - Interested in going back to a Engineer, petroleum. Saw Dr. Alvester Morin in the past, who had planned for an excisional surgery with skin grafting. This was postponed due to pregnancies. Referral has lapsed.  - Had previously taken minocycline and augmentin.    Cholelithiasis  - "It hurts really really bad. Every time I eat something it tears me up."  - Occurs every day with meals.  - Has not changed her diet drastically, but "has laid off a lot of fried foods."  - Taking ibuprofen frequently to control the pain.  - Had an appointment with Surgery yesterday, which she no-show'ed because her daughter is sick.    Asthma  - Taking albuterol PRN. Only takes her prescribed Symbicort "when it is really bad."  - Continues to use her rescue inhaler frequently, roughly every day.  - "My airflow is really really bad."    HTN  - Not taking the toprol 25 mg daily.        1. Essential hypertension    2. Moderate asthma without complication, unspecified whether persistent    3. Gastroesophageal reflux disease, esophagitis presence not specified        Patient's allergies, past medical, surgical, social and family histories were reviewed, updated.       Medications:     Current Outpatient Medications on File Prior to Visit   Medication Sig Dispense Refill    acetaminophen (TYLENOL) 325 MG tablet Take 2 tablets (650 mg total) by mouth every 4 hours as needed 60 tablet 6    NIFEdipine (ADALAT CC) 30 MG 24 hr tablet Take 1 tablet (30 mg total) by mouth daily   Swallow whole.  Do not crush, break, or chew. 30 tablet 4    oxyCODONE (ROXICODONE) 5 MG immediate release tablet Take 1 tablet (5 mg total) by mouth every 4-6 hours as needed for Pain   Max daily dose: 20 mg 20 tablet 0    budesonide-formoterol (SYMBICORT) 160-4.5 MCG/ACT inhaler Inhale 2 puffs into the lungs 2 times daily   Shake well before each use. 1 Inhaler 2    albuterol HFA 108 (90 Base) MCG/ACT inhaler Inhale 1-2 puffs into the lungs every 6 hours as needed for Wheezing   Shake well before each use. 1 Inhaler 3    blood pressure monitor Use as directed 1 each 0     No current facility-administered medications on file prior to visit.        Medications reviewed and reconciled.     Review of Systems:     As above, and:  General: negative for weight change, fever/chills  HEENT: POS for headache; negative for change in vision, sore throat  Cardiovascular: negative for chest pain, palpitations, dyspnea on exertion  Pulmonary: POS for cough and wheezing; negative for SOB, cough, sputum production, wheezing  Gastrointestinal: negative for abdominal pain, nausea/vomiting, constipation/diarrhea, or blood in stool  Genitourinary: negative for dysuria, increased  frequency/urgency, or hematuria  Hematology: negative for easy bruising or new blood clots  Endocrine: negative for cold/heat intolerance, or increased thirst  Neurologic: negative for weakness, paresthesias, numbness  Musculoskeletal: negative for new joint pain, muscle aches  Skin: POS for hidradenitis lesions on armpits, breasts, groin  Psychiatric: negative for mood changes, depression, hallucinations      Physical Exam:     Vitals:    05/10/18 1341   BP: 140/84   Pulse: 80   Temp: 35.9 C (96.6 F)   TempSrc: Temporal   Weight: 128 kg (282 lb 3.2 oz)   Height: 1.651 m (5\' 5" )     Wt Readings from Last 3 Encounters:   05/10/18 128 kg (282 lb 3.2 oz)   11/16/17 137.4 kg (303 lb)   10/27/17 (!) 138.3 kg (304 lb 12.8 oz)       General: Well-appearing, well groomed,  cooperative. Not accompanied. No assistive devices.  HEENT: Sclera anicteric, oropharynx without lesion  Cardiovascular: RRR, NL S1 and S2. No murmur, rub, or gallop  Respiratory: Clear to auscultation bilaterally. No crackles, wheezes, or rhonchi.  Abdomen: Obese. Normoactive BS, soft, non-tender.  Extremities/MSK: No LE edema.   Neurologic: Grossly intact.  Psychiatric: Appropriate affect.  Skin: Hidradenitis lesions on bilateral axillae, breasts, groin.    Labs:   Reviewed and notable for:    No recent      Assessment and Plan:     Jenna [name] is a 33 y.o. female with a PMHx significant for desired pregnancy, cocaine abuse (tested positive 06/02/16), depression, tobacco abuse, obesity, and hidradenitis who presents for follow-up of HTN.    Hidradenitis suppurativa -- Longstanding problem. Lesions located on bilateral axillae, under breasts, and groin. Will start tetracycline abx and refer to Plastic Surgery. Previously seen by Dr. Alvester Morin, with plan for surgical excision & skin grafting. Surgery was delayed by recent pregnancy and now referral has lapsed.  - Start doxycycline 100 mg BID  - Advised to apply hibiclens to affected areas  - Refer back to Plastic Surgery    Cholelithiasis -- RUQ [name] 03/06/18 at [facility] showed a stone filled gallbladder. She was referred to Gen Surg, but no show'ed to appointment due to her child being sick. Will refer back to surgery for cholecystectomy counseling.  - Discontinue OTC ibuprofen  - Start meloxicam 7.5 mg daily -- advised to avoid additional NSAIDs  - Counseled on dietary triggers  - Send back to Gen Surg    HTN - Goal <140/90. Roughly at goal. Will restart BB for BP as well as migraines.  - Restart toprol-XL 25 mg daily  - Home BP monitor -- will check home BPs 2-3x weekly    Asthma -- Poorly controlled. Frequent rescue inhaler use. Will restart ICS/LABA.  - Albuterol q4h PRN  - Restart budesonide-formoterol 160-4.5 2 puffs BID    GERD -- Continue PPI.  - Pantoprazole 20 mg  daily  - Continue Tums PRN    Migraines -- Poorly controlled. Restart BB as above.  - BB as above        Follow-up: 2 months      Health Maintenance Up-to-date:  - Pap Smear: 02/12/16  - Immunizations:    Influenza: declined   Tdap: 06/15/16      Diagnosis Medications/Orders:     ICD-10-CM ICD-9-CM    1. Essential hypertension I10 401.9    2. Moderate asthma without complication, unspecified whether persistent J45.909 493.90    3. Gastroesophageal reflux disease, esophagitis  presence not specified K21.9 530.81       No follow-ups on file.   There are no discontinued medications.      Sharol Roussel, MD 05/10/2018 1:49 PM  Internal Medicine PGY-3  PIC# [id number]    There are no Patient Instructions on file for this visit.

## 2018-05-10 ENCOUNTER — Ambulatory Visit
Payer: Medicaid (Managed Care) | Attending: Student in an Organized Health Care Education/Training Program | Admitting: Student in an Organized Health Care Education/Training Program

## 2018-05-10 ENCOUNTER — Encounter: Payer: Self-pay | Admitting: Student in an Organized Health Care Education/Training Program

## 2018-05-10 VITALS — BP 140/84 | HR 80 | Temp 96.6°F | Ht 65.0 in | Wt 282.2 lb

## 2018-05-10 DIAGNOSIS — K8020 Calculus of gallbladder without cholecystitis without obstruction: Secondary | ICD-10-CM

## 2018-05-10 DIAGNOSIS — J4550 Severe persistent asthma, uncomplicated: Secondary | ICD-10-CM

## 2018-05-10 DIAGNOSIS — L732 Hidradenitis suppurativa: Secondary | ICD-10-CM

## 2018-05-10 DIAGNOSIS — J45909 Unspecified asthma, uncomplicated: Secondary | ICD-10-CM

## 2018-05-10 DIAGNOSIS — K219 Gastro-esophageal reflux disease without esophagitis: Secondary | ICD-10-CM

## 2018-05-10 DIAGNOSIS — I10 Essential (primary) hypertension: Secondary | ICD-10-CM

## 2018-05-10 MED ORDER — BUDESONIDE-FORMOTEROL FUMARATE 160-4.5 MCG/ACT IN AERO *I*
2.0000 | INHALATION_SPRAY | Freq: Two times a day (BID) | RESPIRATORY_TRACT | 5 refills | Status: DC
Start: 2018-05-10 — End: 2018-12-27

## 2018-05-10 MED ORDER — METOPROLOL SUCCINATE 25 MG PO TB24 *I*
25.0000 mg | ORAL_TABLET | Freq: Every day | ORAL | 5 refills | Status: DC
Start: 2018-05-10 — End: 2020-03-20

## 2018-05-10 MED ORDER — DOXYCYCLINE HYCLATE 100 MG PO CAPS *I*
100.0000 mg | ORAL_CAPSULE | Freq: Two times a day (BID) | ORAL | 1 refills | Status: DC
Start: 2018-05-10 — End: 2018-12-27

## 2018-05-10 MED ORDER — MELOXICAM 7.5 MG PO TABS *I*
7.5000 mg | ORAL_TABLET | Freq: Every day | ORAL | 1 refills | Status: DC
Start: 2018-05-10 — End: 2018-05-19

## 2018-05-10 NOTE — Patient Instructions (Signed)
For hidradenitis:  1. Start taking doxycycline 1 capsule (100 mg) 2x daily -- this is an antibiotic to reduce inflammation  2. You will receive a call to schedule with Plastic Surgery  3. Continue to use bleach or hibiclens on the affected areas    For headaches:  1. Start taking metoprolol 1 tab (25 mg) daily -- this will also help your blood pressure    For asthma:  1. Start taking symbicort 2 puffs 2x daily -- this will help your breathing a lot!!!    For your gallbladder:  1. You will receive a call to schedule with General Surgery  2. Start taking meloxicam 1 tab (7.5 mg) daily -- do not take additional ibuprofen with this  3. Try to avoid fatty or greasy foods -- these will cause pain    Patient Education   Biliary Colic   WHAT YOU NEED TO KNOW:   Biliary colic is severe pain in your upper abdomen caused by a gallbladder problem. Your gallbladder stores bile, which helps break down the fats that you eat.      DISCHARGE INSTRUCTIONS:   Medicines:    Medicines  can help decrease pain and muscle spasms. You may also need medicine to calm your stomach and stop vomiting.     Take your medicine as directed.  Contact your healthcare provider if you think your medicine is not helping or you have side effects. Tell him if you are allergic to any medicine. Keep a list of the medicines, vitamins, and herbs you take. Include the amounts, and when and why you take them. Bring the list or the pill bottles to follow-up visits. Carry your medicine list with you in case of an emergency.    Avoid alcohol:  Alcohol can damage your gallbladder and make your symptoms worse.  Maintain a healthy weight:  Ask your healthcare provider how much you should weigh. Ask him to help you create a weight loss plan if you are overweight.   Eat a variety of healthy foods:  Healthy foods include fruits, vegetables, whole-grain breads, low-fat dairy products, beans, lean meats, and fish. Foods that are high in fiber and low in fat and  cholesterol may decrease your symptoms. Ask if you need to be on a special diet.  Exercise:  Ask your healthcare provider about the best exercise plan for you. Exercise may help improve your symptoms.  Follow up with your healthcare provider as directed:  Bring a list of any questions you have so you remember to ask them during your visits.   Contact your healthcare provider if:    You have a fever.     Your pain gets worse, even after you take medicine.     You have nausea or are vomiting.     Your skin or eyes are yellow.     You have questions or concerns about your condition or care.    Return to the emergency department if:    You have severe pain.     You feel like you are going to faint.     You are short of breath.     Copyright [employer] 2019 Information is for [employer] use only and may not be sold, redistributed or otherwise used for commercial purposes. All illustrations and images included in CareNotes are the copyrighted property of A.D.A.M., Inc. or [employer] [facility]  The above information is an educational aid only. It is not intended as medical advice for  individual conditions or treatments. Talk to your doctor, nurse or pharmacist before following any medical regimen to see if it is safe and effective for you.

## 2018-05-19 ENCOUNTER — Other Ambulatory Visit: Payer: Self-pay | Admitting: Student in an Organized Health Care Education/Training Program

## 2018-05-19 ENCOUNTER — Telehealth: Payer: Self-pay | Admitting: Internal Medicine

## 2018-05-19 MED ORDER — MELOXICAM 7.5 MG PO TABS *I*
7.5000 mg | ORAL_TABLET | Freq: Every day | ORAL | 1 refills | Status: DC
Start: 2018-05-19 — End: 2018-05-22

## 2018-05-19 NOTE — Telephone Encounter (Signed)
Copied from CRM [id number]. Topic: Medications/Prescriptions - Medication Question/Problem  >> May 19, 2018 10:45 AM Antonieta Iba wrote:  Jenna [name] called requesting a call back ASAP in regards to the Metoprolol Succer 20mg  that was prescribed to her for her pain. She states that it is not working at all, she takes 2-3 and she is in a lot of pain, even more than how she was before. She would like to speak to a nurse or anyone who could prescribe a stronger medication. Jenna [name] can be reached at [phone]

## 2018-05-19 NOTE — Telephone Encounter (Signed)
Jenna Roussel, MD  Bernestine Amass, RN   Caller: Unspecified (Today, 10:45 AM)            I sent the meloxicam rx to the [facility] outpatient pharmacy. Please let her know and ensure that she is taking the toprol 1x daily.     Thanks,   [employer] relayed to pt who verbalized understanding.

## 2018-05-19 NOTE — Telephone Encounter (Signed)
Writer spoke with pt who stated that she has been taking 2-3 metoprolol a day for pain.   Pt stated that her pain has not decreased but she has been feeling very tired, dizzy and lightheaded.   Writer advised pt that she should only be taking 1 tablet of metoprolol daily because it is for BP and HR.   Writer asked if she has been taking her meloxicam.   Pt stated that she did not receive that medication and she needs it sent to the [facility] today as she will be here today.   Writer advised that this message will be sent to the provider.   Pt verbalized understanding.

## 2018-05-22 ENCOUNTER — Other Ambulatory Visit: Payer: Self-pay | Admitting: Student in an Organized Health Care Education/Training Program

## 2018-05-22 ENCOUNTER — Other Ambulatory Visit: Payer: Self-pay | Admitting: Internal Medicine

## 2018-05-22 MED ORDER — MELOXICAM 7.5 MG PO TABS *I*
7.5000 mg | ORAL_TABLET | Freq: Every day | ORAL | 1 refills | Status: DC
Start: 2018-05-22 — End: 2018-05-31

## 2018-05-22 NOTE — Telephone Encounter (Signed)
Medication request routed to Dr [place] for processing 05/22/2018 3:39 PM

## 2018-05-22 NOTE — Telephone Encounter (Signed)
[name] calling to request prescription(s)   Requested Prescriptions     Pending Prescriptions Disp Refills    meloxicam (MOBIC) 7.5 MG tablet 30 tablet 1     Sig: Take 1 tablet (7.5 mg total) by mouth daily    to be sent to the following Pharmacy  PHARMACY.    Is patient out of the medication? Yes, she stated she lost the medication that was filled on 05/19/2018.      Patient can be reached if necessary at [phone].

## 2018-05-26 ENCOUNTER — Ambulatory Visit: Payer: Medicaid (Managed Care) | Admitting: Surgery

## 2018-05-31 ENCOUNTER — Ambulatory Visit
Payer: Medicaid (Managed Care) | Attending: Student in an Organized Health Care Education/Training Program | Admitting: Student in an Organized Health Care Education/Training Program

## 2018-05-31 ENCOUNTER — Telehealth: Payer: Self-pay | Admitting: Internal Medicine

## 2018-05-31 ENCOUNTER — Encounter: Payer: Self-pay | Admitting: Student in an Organized Health Care Education/Training Program

## 2018-05-31 DIAGNOSIS — K8020 Calculus of gallbladder without cholecystitis without obstruction: Secondary | ICD-10-CM

## 2018-05-31 MED ORDER — URSODIOL 300 MG PO CAPS *I*
600.0000 mg | ORAL_CAPSULE | Freq: Two times a day (BID) | ORAL | 5 refills | Status: DC
Start: 2018-05-31 — End: 2020-03-20

## 2018-05-31 MED ORDER — NAPROXEN 500 MG PO TABS *I*
500.0000 mg | ORAL_TABLET | Freq: Two times a day (BID) | ORAL | 3 refills | Status: DC
Start: 2018-05-31 — End: 2018-10-13

## 2018-05-31 NOTE — Telephone Encounter (Signed)
Error

## 2018-05-31 NOTE — Telephone Encounter (Signed)
Spoke with pt who states that she has gallstones and is still in "excruciating" pain.  She states that the pain isn't any worse than normal and that it's always this bad.  She states the meloxicam is not touching the pain and she is taking 4 at a time.  Every time she eats, she feels like her "body is under attack".  Pain is the right, upper quadrant.  No fevers/chills, or vomiting.  Endorses nausea.  She states she has a twins and an 25month old and going to the ED right now or leaving her house is not an option.    She is requesting something stronger for pain.  Advised her Clinical research associate will discuss with team and give her a call back.  She verbalized understanding.

## 2018-05-31 NOTE — Progress Notes (Signed)
Telephone Visit     Reason for visit: abdominal pain       HPI:     Jenna [name] is a 33 y.o. female with a PMHx significant for recent desired pregnancy, cocaine abuse (tested positive 06/02/16), depression, tobacco abuse, obesity, and hidradenitis who presents for follow-up of HTN.    Abdominal pain:  - has been severe and going on for a long time, she is very frantic and tearful on the phone and asking for Vicodin, states that she is totally out of commission because of the pain    - States that the pain is located on the right side and shoots to her back and the front of her abdomen, feels like a burning, sharp pain  - does not feel like electric shocks    - does not really change with positions, but sometimes position changes help    - pain is constantly there now but gets worse with eating   - she has been avoiding fatty foods per last visit instructions and only eating baked foods, boiled chicken, rice but is not able to eat very much because of the pain   Burning, sharp pain   - state she is having constant nausea but no vomiting   - having bowel movements every 2-3 days (normally goes more then this), looks green, sometimes looks jelly, mostly solid, stool never looked black or like blood   - no fever/chills  - She is taking 4-5 tablets of 7.5mg  meloxicam, sometimes 2 times daily and tyelenol and ice packs   - not sure if she has lost weight    - no drugs or alcohol, but still smoking     Patient's problem list, allergies, and medications were reviewed and updated as appropriate. Please see the EHR for full details.    Assessment Plan:  History is most consistent with cholelithiasis given episodic pain, worse with eating, and RUQ [name] in December which showed stone filled gallbladder. Does not sound like cholecystitis or infectious at this point given lack of fever/chills and overall feeling sick. However, there is a possibility this could also be a resultant gallstone pancreatitis. She has been unable to meet  with Gen Surgery due to child being sick up to this point.     - Will reorder refferal to general surgery    - Start ursodiol 600 mg twice daily  - Try naproxen 500 mg twice a day for pain relief as meloxicam has not been effective   - Will get lab work including CBC, BMP, LFTs, amylase/lipase to rule out cholecystitis definitively and rule out pancreatitis     The plan was discussed with the patient and the patient/patient rep demonstrated understanding to the provider's satisfaction.     Consent was previously obtained from the patient to complete this telephone consult; including the potential for financial liability.    21+ minutes was spent reviewing the EMR and management of this patient.

## 2018-05-31 NOTE — Patient Instructions (Addendum)
-   Please call [phone] to schedule an appointment with the surgery department to set up a consultation to discuss removing the gallbladder   - Start ursodiol 600 mg twice daily with food to help dissolve some of the gallstones  - Try naproxen 500 mg twice a day for pain relief   - Please have labs done at your convenience   - Call if you start experiencing fever, chills, severe vomiting

## 2018-05-31 NOTE — Telephone Encounter (Signed)
Copied from CRM [id number]. Topic: Access to Care - Speak to Provider/Office Staff  >> May 31, 2018  1:42 PM Jenna [name] wrote:  Ms. Savaria calling to report that she is experiencing a lot of shooting pain up her right side around to her back.  Patient states was is scheduled to have her gall bladder removed.     Patient requesting same day appointment? No    Patient requesting call back? Yes    Ms. Carawan states the meloxicam that was prescribed by the office is not working, not "even touching the tip of the ice berg as far as her pain".  Patient states she is currently taking 4 at a time, but is not helping.    Patient is requesting a prescription for pain medication sent to [facility] in [facility].         Phone number confirmed at [phone] or [phone].

## 2018-06-01 ENCOUNTER — Telehealth: Payer: Medicaid (Managed Care) | Admitting: Internal Medicine

## 2018-06-05 ENCOUNTER — Ambulatory Visit: Payer: Medicaid (Managed Care) | Attending: Surgery | Admitting: Surgery

## 2018-06-05 DIAGNOSIS — K8050 Calculus of bile duct without cholangitis or cholecystitis without obstruction: Secondary | ICD-10-CM

## 2018-06-05 NOTE — Progress Notes (Signed)
Subjective:     Patient ID: Jenna [name] is a 33 y.o. female.    Abdominal Pain   This is a recurrent problem. The current episode started more than 1 year ago. The onset quality is sudden. The problem occurs intermittently. The problem has been waxing and waning. The pain is located in the RUQ. The pain is at a severity of 7/10. The pain is severe. The quality of the pain is burning, aching and sharp. The abdominal pain radiates to the right shoulder and back. Associated symptoms include anorexia, constipation and nausea. Pertinent negatives include no fever or vomiting. The pain is aggravated by eating. The pain is relieved by nothing. Prior diagnostic workup includes ultrasound. Her past medical history is significant for abdominal surgery and gallstones.       Patient's medications, allergies, past medical, surgical, social and family histories were reviewed and updated as appropriate.    Review of Systems   Constitutional: Negative for fever.   Gastrointestinal: Positive for abdominal pain, anorexia, constipation and nausea. Negative for vomiting.   All other systems reviewed and are negative.          Objective:   Physical Exam  Deferred due to teleconsult.      Assessment:      Biliary colic       Plan:      I had a long discussion with the patient about the normal function and anatomy of the gallbladder as well as the pathophysiology of gallstones. She appears to have had episodes of biliary colic. We talked about the treatment options and their risks and benefits. I told her that there is no effective medical treatment and that even on a low fat diet she may experience recurrent episodes of biliary colic or worse acute cholecystitis or pancreatitis. I told her that the risks of laparoscopic cholecystectomy include but are not limited to: bleeding, infection, damage to nearby structures (including bile duct and intestines), the need for additional procedures and post-cholecystectomy syndrome. Given her  options he wishes to proceed with laparoscopic cholecystectomy, we discussed both traditional and single incision surgery. We will get his scheduled at her earliest convenience. In the interim she will call if she has any questions or concerns. All of her questions were answered.

## 2018-06-06 NOTE — Addendum Note (Signed)
Addended by: Luna Fuse on: 06/06/2018 11:05 AM     Modules accepted: Level of Service

## 2018-06-18 ENCOUNTER — Telehealth: Payer: Self-pay | Admitting: Oncology

## 2018-06-18 NOTE — Telephone Encounter (Signed)
Phone Call -- [facility] After Hours Triage     Date of call: 06/18/2018  Time of call: 10:18 AM  Phone number: [phone]    Jaquan Jane 08-Nov-1985 called reporting:   "All my organs hurt."      Impression     Patient calls report 3 day history of worsening abdominal pain. She describes deep, viceral pain extending from right side across her stomach. She has been unable to eat as much. She has been barbecuing food rather than frying food, and this has helped moderately. She is also taking all her of her medications - ursodiol, naproxen - as prescribed by Dr. Celedonio Savage recently on 3/18.    Disposition / Plan      Patient instructed to:  1. Continue taking ursodiol, naproxen. She will supplement with tylenol.  2. If pain continues, patient to present to ED for further pain control, possible imaging, and potential more urgent surgical involvement.  3. Instructed to call back if symptoms worsen, change or progress.    Author: Jonah Blue, MD as of 06/18/2018  at 10:18 AM

## 2018-07-02 ENCOUNTER — Telehealth: Payer: Self-pay | Admitting: Internal Medicine

## 2018-07-02 NOTE — Telephone Encounter (Signed)
[facility] Call      Date of call:  07/02/2018  Time of call: 8:26 AM  Phone Number: [phone]    Jenna [name] 06-27-1985 called reporting "gall bladder problem in a lot of pain"    HISTORY   Jenna [name] is a 33 y.o. female with a history of biliary colic planning cholecystectomy.    Attempted to call multiple times at provided numbers and there was no answer. Left message.          ASSESSMENT/PLAN   Attempted to contact patient multiple times without success.      Author: Theodoro Kos, MD  07/02/2018 at 8:26 AM

## 2018-07-02 NOTE — Telephone Encounter (Signed)
[facility] Call      Date of call:  07/02/2018  Time of call: 10:02 AM  Phone Number: [phone]    Jenna [name] February 18, 1986 called reporting gall bladder pain    HISTORY   Jenna [name] is 33 y.o. female calling regarding her gall bladder pain.    She states that her abdominal pain has been getting worse all week.  It is very bad anytime she tries to eat anything.  She ate last night and the pain has persisted into this morning.  She has been taking 4 naproxen's about every 2 hours, as well as 4 of another pill that she does not know the name of.  Then she also tried to take some ibuprofen with the naproxen since it was not helping her pain at all.  She has not been taking any Tylenol.  She is currently laying in bed and unable to get out of bed because the pain is all over her abdomen and up into her back.  She feels like she is "dying".  She has no fevers or chills, no vomiting.  She does feel nauseous.  This pain is much worse than normal.    She was wondering why we cannot just prescribe her a stronger pain medication.  She is afraid to go to the emergency department because she has asthma and does not want to get coronavirus.      ASSESSMENT/PLAN     Worsening, persistent abdominal pain not responding to medications in a patient with history of biliary colic awaiting cholecystectomy.  Also with excessive NSAID use.  Possible that she has cholecystitis versus gastritis from excessive NSAID use.  Also possible she has acute kidney injury from excessive NSAID use.  Advised patient to present to the emergency department for further evaluation.  Reassured her that there are measures in place to prevent coronavirus transmission in the hospital, and that an infection of the gallbladder could be life-threatening if she is not evaluated.  Discussed clinic policy to not prescribe controlled substances, including opioids, after hours.  She agreed to go to the emergency department she does not have a way to get to the  emergency department, so she will call 911 after she hangs up from her call.    Author: Theodoro Kos, MD  07/02/2018 at 10:02 AM

## 2018-07-04 NOTE — Telephone Encounter (Signed)
See separate encounter

## 2018-07-18 ENCOUNTER — Encounter: Payer: Self-pay | Admitting: Internal Medicine

## 2018-07-20 NOTE — Anesthesia Preprocedure Evaluation (Signed)
Anesthesia Pre-operative History and Physical for Jenna [name]  History and Physical Performed at [facility] ([facility])      .  CPM Summary:  Jenna [name] presents preoperatively for anesthesia evaluation prior to CHOLECYSTECTOMY, LAPAROSCOPIC  on 07/25/2018 by Dr burns.  She  has a past medical history of Allergic Rhinitis Anemia, Asthma, Heart murmur, Hydradenitis, Migraine Headache, Mood disorder, Morbid obesity with BMI of 50.0-59.9, adult, PONV, Tobacco abuse and iron deficiency anemia.  The patient is inactive, cannot climb stairs and cannot lay flat. Pt becomes SOB with ADLs. She answered no to all perioperative EKG screening questions.  The patient denies family issues with anesthesia. Pt has been seen by PCP multiple times recently for abdominal pain. On pre screening today however pt states she is non compliant with medications- including BP meds and inhalers. She feels more short of breath than at baseline and feels like she needs a blood transfusion as she has had in the past for anemia. She has been using rescue inhaler several times a day every other day. Will send note to PCP via PP for optimization.   By Dagmar Hait, PA at 10:42 AM on 07/20/2018    Anesthesia Evaluation Information Source: patient     ANESTHESIA HISTORY    + history of anesthetic complications          PONV  Pertinent(-):  No Family hx of anesthetic complications    GENERAL    + Infection (hiadrenitis supprativa)  Pertinent (-):  No obesity or substance abuse    HEENT  Pertinent (-):  No glaucoma, visual impairment, hearing loss, hoarseness or neck pain PULMONARY    + Smoker          tobacco currently    + Asthma (uses rescue inhaler every other day)          moderate persistent, cold induced, exercise induced    + Shortness of Breath (SOB slighly increased from baseline)          with minimal exertion    + Snoring  Pertinent(-):  No cough/congestion, sleep apnea or COPD  Comment: Denies f/c, cough  Has chronic SOB from  asthma    CARDIOVASCULAR  Good(4+METs) Exercise Tolerance    + Hypertension          well controlled  Pertinent(-):  No past MI, cardiac testing, angina, CAD, coronary intervention, anticoagulants/antiplatelet medications, dysrhythmias, CHF, vascular Issues or hx of DVT    Comment: H/o heart murmur    GI/HEPATIC/RENAL  Last PO Intake: Enter Last PO Intake in ROS/Med Hx Tab    + GERD          well controlledesophageal issues  Pertinent(-):  No PUD, nausea, vomiting, alcohol use, liver  issues, bowel issues, renal issues or urinary issues  NEURO/PSYCH    + Headaches (every other day)          migraines    + Psychiatric Issues          depression  Pertinent(-):  No dizziness/motion sickness, syncope, seizures, cerebrovascular event, neuromuscular disease or gait/mobility issues    ENDO/OTHER    + Menstruating          regularly  Pertinent(-):  No diabetes mellitus, thyroid disease    HEMATOLOGIC    + Bruises/bleeds easily    + Blood dyscrasia          anemia  Pertinent(-):  No coagulopathy, anticoagulants/antiplatelet medications or arthritis         Physical  Exam Not Completed________________________________________________________________________  Scheryl Darter Plan  Anesthesia Consent Not Performed

## 2018-07-20 NOTE — Telephone (Signed)
Pre-Operative Instructions                   PLEASE CALL [phone] TODAY TO SCHEDULE YOUR COVID-19 SWAB TEST    FOLLOW YOUR SURGEON'S INSTRUCTIONS IF DIFFERENT THAN BELOW.                    PRIOR TO SURGERY  Five days before surgery, if surgeon does not specify, please STOP taking: NAPROXEN AND MELOXICAM     Anti-inflammatory meds: (Ibuprofen, Motrin, Advil, Mobic, Meloxicam, Aleve, Naproxen, Voltaren, etc.)   Vitamins and herbal supplements, including herbal teas    YOU MAY TAKE ACETAMINOPHEN (TYLENOL) as needed   Directions regarding your prescribed blood thinners, including aspirin, should be approved by your cardiologist or prescribing doctor.    ARRIVAL/SURGICAL TIME   Call [phone] between 2PM and 4PM on the day before surgery to receive your arrival/surgery time.     Call Friday afternoon if your surgery is on a Monday.   Please arrange for transportation to and from the hospital. You must have a responsible adult to stay with you after your surgery if you are going home the same day.   Please know that you should not drive for 24 hours after receiving anesthesia.    DAY BEFORE SURGERY   Keep yourself well hydrated to aid in the placement of your IV and for your general well-being.   Do not eat anything after midnight the night before your surgery (including candy or gum).                                                                                         DAY OF SURGERY   DO NOT CONSUME FOOD OF ANY KIND.     Only Gatorade, clear apple juice or water from midnight until 2 hours before your scheduled surgery.     Please bring photo identification and your insurance card with you for registration.     If you are female, please be prepared to provide a urine sample on arrival to the preoperative area unless you are one year post menopause or have had a hysterectomy.       Please wear loose, comfortable clothing and comfortable walking shoes.  Wear something that will easily  accommodate a bandage, cast or other type of dressing at your surgical site.     DO NOT WEAR: JEWELRY/RINGS, MAKEUP, DARK NAIL POLISH, HAIR PINS, BODY LOTION OR SCENTS.       Please understand that rings and body piercings need to be removed and left at home.               If they are not removed, your surgery is at risk of being delayed or cancelled.       Please do not bring valuables/electronics/cash with you on the day of surgery. [facility] is not responsible for damage/loss/theft of these items. Thank you.     If wearing eyeglasses, please bring a case.  DO NOT WEAR CONTACT LENSES.     You may shower, brush your teeth, and use deodorant.     NO VISITORS  MEDICATIONS: DAY OF SURGERY     Take your medications as directed according to your printed, verbal or MyChart instructions.   Anxiety and pain medications may be taken as prescribed at any time prior to arrival.    Medications from the [facility] will be administered to you under the direction of your surgeon. Please leave your prescriptions at home with the exception of inhalers.    DIABETICS: ON THE DAY OF SURGERY     Do not take oral diabetes medications.   DO NOT TAKE ANY REGULAR OR HUMALOG INSULIN (THIS INSTRUCTION DOES NOT APPLY TO INSULIN PUMPS).    Take half the dose of NPH insulin.   Take Lantus or Levemir insulin as usual.   If you are diabetic and feeling symptomatic, you may take sugar containing clear liquids after midnight the night before your surgery if your glucose level is less than 70.  If you are still feeling symptomatic, you may report to the Surgery Center early and report your symptoms to anesthesia personnel.   Please bring backup supplies if you are currently using an insulin pump.  Thank you.    AT THE HOSPITAL ON THE DAY OF SURGERY     Park in the Main Ramp garage.  Enter the building through the [employer].      Please stop at the Information Desk for directions to the Surgery Center on  [address].      [facility] OUT[facility]ARM[facility]    As a convenience, prior to your discharge we will fill any discharge medications you require.      The pharmacy is open from 9am to 5:30pm on weekdays and 10am-2pm on Saturdays.     If you will be alone on the day of surgery and want to leave with your prescribed medication, you may:   Bring a check made out to [facility]   Use a credit card to pay for your prescription   Have your family call the pharmacy with a credit card number   Only bring cash to the hospital as a last resort. Prescriptions cannot be filled without payment. Thank you for your consideration.    QUESTIONS?     Question about these instructions? Call [phone], select option 2, and leave a message at any time.   A nurse will return your call during our regular business hours.   Any questions regarding specifics about your surgery or recovery? Please call your surgeons office.

## 2018-07-20 NOTE — Telephone (Signed)
Locked OR time on 5.12.20 for 1515. Patient notified as she is setting up medical transportation. Advised to arrive at hospital 1315. Patient aware.

## 2018-07-24 ENCOUNTER — Telehealth: Payer: Self-pay | Admitting: Internal Medicine

## 2018-07-24 NOTE — Telephone Encounter (Signed)
Copied from CRM [id number]. Topic: Access to Care - Speak to Provider/Office Staff  >> Jul 24, 2018  2:18 PM Aura Fey wrote:  Shanda Bumps from [facility] pre-surgical screening is calling to reach provider as patient is scheduled for surgery tomorrow 5/12, but they haven't gotten information to confirm if she's been optimized.    She didn't see whether or not the patient has been seen for optimization. She would like a call back to advise patient's optimization status.     Shanda Bumps also stated that the office closes at 3:30.  She can be reached at [phone].

## 2018-07-24 NOTE — Telephone Encounter (Signed)
Unable to reach [place]. Left generic message with call back number on [employer].

## 2018-07-24 NOTE — Telephone Encounter (Signed)
Copied from CRM [id number]. Topic: Access to Care - Speak to Provider/Office Staff  >> Jul 24, 2018 12:57 PM Joycelyn Schmid wrote:  Shanda Bumps from [facility] Pre Surgical Screening is calling to reach provider as patient is scheduled for surgery tomorrow 5/12, but they haven't gotten information to confirm yet if she's been optimized.    She didn't see that patient had been seen for optimization. She would like a call back to advise patient's optimization status.     She can be reached back at [phone] today

## 2018-07-25 ENCOUNTER — Ambulatory Visit: Payer: Medicaid (Managed Care)

## 2018-07-25 NOTE — Telephone Encounter (Signed)
Spoke with Jenna [name] states that patient's surgery was rescheduled to 5/27 - noticed that as calling us yesterday.   Shanda Bumps asks that we go ahead and get patient scheduled for optimization (surgical clearance) appointment.     Per notes below, Dr. Al Corpus had advised in person visit.     Routing to team PSS to follow up and schedule.

## 2018-07-25 NOTE — Telephone Encounter (Signed)
Patient has been scheduled for 07/31/18 with Arlean Hopping for surgical clearance.

## 2018-07-25 NOTE — Telephone Encounter (Signed)
-----   Message -----   From: Adriana Simas, RN   Sent: 07/21/2018  9:29 AM EDT   To: Ortencia Kick   Subject: FW: Lap chole 07/25/18                 Can we call and schedule in Office visit with team or APP as requested ASAP   ----- Message -----   From: Raiford Noble, MD   Sent: 07/21/2018  8:43 AM EDT   To: Dagmar Hait, PA, *   Subject: RE: Lap chole 07/25/18                 I feel that we should see her in clinic for optimization. She was supposed to have blood work after her last office visit, but never had it done. We will try to schedule an office visit with her ASAP.     Team, can we call her to set up a pre-operative evaluation ASAP with one of the residents or [place], our [name].     Thanks,   Dr. Al Corpus   ----- Message -----   From: Dagmar Hait, [name]   Sent: 07/20/2018 10:18 AM EDT   To: Raiford Noble, MD, Theodoro Kos, MD, *   Subject: Lap chole 07/25/18                   Pt had telemedicine pretesting appointment 07/20/18. Pt noted at screening that she is not regularly taking her inhalers or BP medications, feels more SOB than baseline, and feels like she "needs a blood transfusion".   Is this patient optimized for surgery? Surgery scheduled 07/25/18. Thank you.

## 2018-07-25 NOTE — Telephone Encounter (Signed)
Writer notes procedure appears to have been moved to end of month.   Called Shanda Bumps to confirm - no answer.   Left message to call office to confirm.   If so, will route to team PSS to schedule patient for in-office visit for pre-surgical appointment as requested by Dr. Al Corpus in Procedure encounter - writer copied text into note below.

## 2018-07-31 ENCOUNTER — Ambulatory Visit: Payer: Medicaid (Managed Care) | Admitting: Internal Medicine

## 2018-08-03 ENCOUNTER — Encounter: Payer: Self-pay | Admitting: Gastroenterology

## 2018-08-17 ENCOUNTER — Telehealth: Payer: Self-pay | Admitting: Internal Medicine

## 2018-08-17 ENCOUNTER — Telehealth: Payer: Self-pay

## 2018-08-17 NOTE — Telephone Encounter (Signed)
Pt calling today requesting treatment for BV.  She states that she has had it before and thinks it's starting again but she wants to catch it early.  She has changed soap recently and reports having used a douche.  She has a fishy odor, denies discharge, irritation, itching, abdominal pain, or fever.  Pt offered and accepted a telehome zoom appointment 08/18/18 @1 :30 pm.  Zoom information sent via mychart.

## 2018-08-17 NOTE — Telephone Encounter (Signed)
Spoke to pt.  Pt. Encouraged to call OBGYN to schedule acute appt. For concern of malodorous vaginal discharge.  Pt. Encouraged to call SIM if anything further needed.  Pt. states verbal understanding.

## 2018-08-17 NOTE — Telephone Encounter (Signed)
Copied from CRM [id number]. Topic: Access to Care - Speak to Provider/Office Staff  >> Aug 17, 2018  9:59 AM Jenna [name] wrote:  Patient reports she has a "fishy" odor from her vagina.  She believes its BV.    Patient contact number is [phone] (H

## 2018-08-18 ENCOUNTER — Ambulatory Visit: Payer: Medicaid (Managed Care) | Admitting: Obstetrics and Gynecology

## 2018-08-18 ENCOUNTER — Encounter: Payer: Self-pay | Admitting: Obstetrics and Gynecology

## 2018-08-18 NOTE — Progress Notes (Signed)
No show letter sent.

## 2018-08-18 NOTE — Progress Notes (Signed)
Pt did not present to zoom appt today at 1330. Pt may reschedule appt prn.

## 2018-08-18 NOTE — Progress Notes (Signed)
Pt no showed zoom appt today at 1330. Pt may reschedule prn

## 2018-08-24 ENCOUNTER — Encounter: Admission: RE | Payer: Self-pay | Source: Ambulatory Visit

## 2018-08-24 ENCOUNTER — Ambulatory Visit: Admission: RE | Admit: 2018-08-24 | Payer: Medicaid (Managed Care) | Source: Ambulatory Visit | Admitting: Surgery

## 2018-08-24 HISTORY — DX: Other specified postprocedural states: Z98.890

## 2018-08-24 HISTORY — DX: Other specified postprocedural states: R11.2

## 2018-08-24 SURGERY — CHOLECYSTECTOMY, LAPAROSCOPIC
Anesthesia: General | Site: Abdomen

## 2018-09-07 ENCOUNTER — Telehealth: Payer: Medicaid (Managed Care) | Admitting: Surgery

## 2018-10-13 ENCOUNTER — Other Ambulatory Visit: Payer: Self-pay | Admitting: Internal Medicine

## 2018-10-13 MED ORDER — NAPROXEN 500 MG PO TABS *I*
500.0000 mg | ORAL_TABLET | Freq: Two times a day (BID) | ORAL | 0 refills | Status: DC
Start: 2018-10-13 — End: 2018-10-18

## 2018-10-13 NOTE — Telephone Encounter (Signed)
MD unavailable, Rx request routed to [facility] for 10/13/2018

## 2018-10-16 ENCOUNTER — Telehealth: Payer: Self-pay | Admitting: Internal Medicine

## 2018-10-16 NOTE — Telephone Encounter (Signed)
Spoke with pt.  Pt states she has abdominal pain, frequent diarrhea for three days.  Pt denies fever/chills, blood in the stool.  Offered pt same-day appointment.  Pt states she can't come in until Wednesday.  Pt scheduled to see [name] on 8/5 for further evaluation.  Advised pt to keep her fluid intake up, avoid high fat foods.  Advised pt to call with any changes.  Pt verbalized understanding.

## 2018-10-16 NOTE — Telephone Encounter (Signed)
Copied from [place] [id number]. Topic: Return Call - Speak to Provider/Office Staff  >> Oct 16, 2018 11:36 AM Jenna [name] wrote:  Jenna [name] calling to report that she is experiencing "eggy burp" that last for a long time, discomfort in her gut, and diarrhea . These symptoms have been going on for a 3 day(s)    Jenna [name] is also experiencing abdominal pain.  she rates this pain a 8/10.    Patient requesting same day appointment? no    Phone number confirmed at [phone].

## 2018-10-18 ENCOUNTER — Ambulatory Visit: Payer: Medicaid (Managed Care) | Admitting: Internal Medicine

## 2018-10-18 ENCOUNTER — Other Ambulatory Visit: Payer: Self-pay | Admitting: Internal Medicine

## 2018-10-18 MED ORDER — NAPROXEN 500 MG PO TABS *I*
500.0000 mg | ORAL_TABLET | Freq: Two times a day (BID) | ORAL | 0 refills | Status: DC
Start: 2018-10-18 — End: 2020-03-20

## 2018-10-18 MED ORDER — ACETAMINOPHEN 325 MG PO TABS *I*
650.0000 mg | ORAL_TABLET | ORAL | 6 refills | Status: DC | PRN
Start: 2018-10-18 — End: 2019-03-29

## 2018-10-18 NOTE — Telephone Encounter (Signed)
Copied from [place] [id number]. Topic: Medications/Prescriptions - Refill Request  >> Oct 18, 2018 11:18 AM Kerby Nora wrote:  Jenna [name] calling to request prescription(s) naproxen (NAPROSYN) 500 MG tablet and acetaminophen (TYLENOL) 325 MG tablet  to be sent to the following [facility] [place] any alternate medication, being effectiveness is only for a very limited amount of time.    Is patient out of the medication? Yes      Patient can be reached if necessary at [phone].

## 2018-10-19 NOTE — Telephone Encounter (Signed)
Spoke with pt.  Pt states she continues to have pain in her right side r/t gall bladder issues.  Pt states she isn't able to get the surgery right now d/t child care concerns (pt has 7 children).  Pt inquiring about stronger pain medication, feels the naproxen isn't helping.  Offered pt a follow up visit in our office.  Pt declined to schedule an OV, will call back when she is ready to reschedule.

## 2018-11-22 DIAGNOSIS — Z8742 Personal history of other diseases of the female genital tract: Secondary | ICD-10-CM | POA: Insufficient documentation

## 2018-11-22 DIAGNOSIS — D509 Iron deficiency anemia, unspecified: Secondary | ICD-10-CM | POA: Insufficient documentation

## 2018-11-22 LAB — UNMAPPED LAB RESULTS
ABO RH Blood Type (HT): A POS — NL
Antibody Screen (HT): NEGATIVE — NL
Basophil # (HT): 0.1 10 3/uL — NL (ref 0.0–0.2)
Basophil % (HT): 1 % — NL (ref 0–3)
Eosinophil # (HT): 0.3 10 3/uL — NL (ref 0.0–0.6)
Eosinophil % (HT): 3 % — NL (ref 0–5)
Hematocrit (HT): 25 % — ABNORMAL LOW (ref 35–47)
Hematocrit (HT): 28 % — ABNORMAL LOW (ref 35–47)
Hemoglobin (HGB) (HT): 6.2 g/dL — ABNORMAL LOW (ref 12.0–16.0)
Hemoglobin (HGB) (HT): 7.7 g/dL — ABNORMAL LOW (ref 12.0–16.0)
Lymphocyte # (HT): 3.1 10 3/uL — NL (ref 1.0–4.8)
Lymphocyte % (HT): 26 % — NL (ref 15–45)
MCHC (HT): 25.1 g/dL — ABNORMAL LOW (ref 31.0–37.5)
MCHC (HT): 27.8 g/dL — ABNORMAL LOW (ref 31.0–37.5)
MCV (HT): 74 fL — ABNORMAL LOW (ref 80–100)
MCV (HT): 76 fL — ABNORMAL LOW (ref 80–100)
Mean Corpuscular Hemoglobin (MCH) (HT): 18.7 pg — ABNORMAL LOW (ref 26.0–34.0)
Mean Corpuscular Hemoglobin (MCH) (HT): 21.2 pg — ABNORMAL LOW (ref 26.0–34.0)
Monocyte # (HT): 1 10 3/uL — NL (ref 0.1–1.0)
Monocyte % (HT): 9 % — NL (ref 0–15)
Neutrophil # (HT): 7.3 10 3/uL — NL (ref 1.8–8.0)
Platelets (HT): 309 10 3/uL — NL (ref 150–450)
Platelets (HT): 272 10 3/uL — NL (ref 150–450)
RBC (HT): 3.32 10 6/uL — ABNORMAL LOW (ref 3.80–5.20)
RBC (HT): 3.63 10 6/uL — ABNORMAL LOW (ref 3.80–5.20)
RDW (HT): 18.6 % — ABNORMAL HIGH (ref 0.0–15.2)
RDW (HT): 19.1 % — ABNORMAL HIGH (ref 0.0–15.2)
Seg Neut % (HT): 62 % — NL (ref 45–75)
WBC (HT): 11.9 10 3/uL — ABNORMAL HIGH (ref 4.0–11.0)
WBC (HT): 10.6 10 3/uL — NL (ref 4.0–11.0)

## 2018-11-23 ENCOUNTER — Telehealth: Payer: Self-pay | Admitting: Internal Medicine

## 2018-11-23 NOTE — Telephone Encounter (Signed)
Noted  

## 2018-11-23 NOTE — Telephone Encounter (Signed)
Copied from [place] [id number]. Topic: Appointments - Schedule Appointment  >> Nov 23, 2018 11:09 AM Brendia Sacks wrote:  Lattie Haw [facility] called to schedule a hospital discharge appointment for [facility].    Scheduled for Monday 11/27/2018 at 3:00 pm with [name], PA.    Lattie Haw can be reached at [phone], if needed.    Lattie Haw is requesting that we call the [facility] to confirm the appointment at [phone] (home)

## 2018-11-27 ENCOUNTER — Ambulatory Visit: Payer: Medicaid (Managed Care) | Admitting: Internal Medicine

## 2018-11-28 ENCOUNTER — Telehealth: Payer: Self-pay | Admitting: Internal Medicine

## 2018-11-28 NOTE — Telephone Encounter (Unsigned)
Copied from [place] [id number]. Topic: Access to Care - Speak to Provider/Office Staff  >> Nov 28, 2018 10:48 AM Kerby Nora wrote:  The patient is requesting to speak with Curt Bears. Patient stated that this is regarding obtaining the number to schedule  appointment discussed during visit on 11/27/2018.    Please call the patient back at [phone] to discuss. Marland Kitchen

## 2018-11-28 NOTE — Telephone Encounter (Signed)
Spoke with patient and advised she did not come to the appointment yesterday so PA Curt Bears is not sure what number she is referring too. Patient stated she didn't even know she had an appointment and is trying to schedule surgery for her gallbladder. Explained she will need to come in for clearance on that. Scheduled patient for 10/1 @ 3:20pm.

## 2018-11-29 ENCOUNTER — Telehealth: Payer: Self-pay | Admitting: Internal Medicine

## 2018-11-29 ENCOUNTER — Other Ambulatory Visit: Payer: Self-pay | Admitting: Student in an Organized Health Care Education/Training Program

## 2018-11-29 DIAGNOSIS — K8020 Calculus of gallbladder without cholecystitis without obstruction: Secondary | ICD-10-CM

## 2018-11-29 MED ORDER — TRAMADOL HCL 50 MG PO TABS *I*
50.0000 mg | ORAL_TABLET | Freq: Four times a day (QID) | ORAL | 0 refills | Status: DC | PRN
Start: 2018-11-29 — End: 2018-12-27

## 2018-11-29 NOTE — Telephone Encounter (Signed)
Spoke to pt.  Pt. C/o right side back/flank pain from Cal[place] of gallbladder without cholecystitis without obstructionfrom ED visit on 11/22/18.  Pt. Requesting refill on tramadol for pain.  Pt. Informed needs to be evaluated for Follow up.  Pt. Scheduled for 11/30/18 @ 950 am with off team H. Gaetano Net NP.  Pt. Informed will send Msg. To [facility] to see if provider will order pain medication to get through until appt. Tomorrow.  Pt. Informed this is not typically done with out evaluation.  It was explained to pt. That Office visit follow up scheduled is needed and pt. Strongly advised to attend.  Msg. Sent to providers for review/advisement.  Pt. States verbal understanding.

## 2018-11-29 NOTE — Telephone Encounter (Signed)
Writer called patient to give her transportation information    Trip Confirmation  Jenna [name] is scheduled for a Primary Care Office appointment on 11/30/2018 at 9:45am.   At 8:45am[employer] [facility] will pick up [name] at [place] and take them to [place], [employer] will pick up [name] at [place] and take them to [place], [place] may contact [place] [employer] at [phone] for changes to pick-up and return times prior to this trip.     [name]'s Trip   Date: 11/30/2018 Time: 9:45am Inv# [id number]

## 2018-11-29 NOTE — Progress Notes (Signed)
Will send in very limited script for patient for tramadol. Recently d/c'ed from [facility] with script for tramadol for abd pain. Pt will be assessed by office PA tomorrow morning to see if medication still appropriate. If patient does not show for appt, will not prescribe further pain medications.

## 2018-11-29 NOTE — Telephone Encounter (Signed)
Spoke to pt.  Pt. C/o back pain, flank pain.  Pt. Requesting pain medication. Pt. Informed she missed scheduled ED FUV.  Pt. Informed pain medication will most likely not be ordered with out office appt. For evaluation.  Phone call disconnected.  Attempted to call pt. Back.  Left msg for pt. To return Call to [facility] office. Call back number provided.  Will attempt to reach pt. At a later time.

## 2018-11-29 NOTE — Telephone Encounter (Signed)
Copied from [place]. Topic: Medications/Prescriptions - Medication Question/Problem  >> Nov 29, 2018 11:05 AM Verda Cumins wrote:  Patient has back and side pain.  She states she was prescribed tramadol by [facility] went she went to ED.  She is requesting a refill  Patient is not able to eat due to pain.    No appts available today.  Patient wants appt today.      Patient contact number is [phone]

## 2018-11-29 NOTE — Telephone Encounter (Signed)
Jenna [name]   I sent in a very small script for tramadol to bridge this patient until tomorrow for appt with [facility]. They can evaluate the appropriateness of this medication then. If the patient does not show for her appt tomorrow, I will not be prescribing any further pain management meds until she is able to come to the office.     Thanks,   Judson Roch

## 2018-11-29 NOTE — Telephone Encounter (Signed)
Spoke to pt.   Pt. Informed provider sent  very small script for tramadol to bridge this patient until tomorrow for appt with [facility].   Pt. Informed script sent to [facility].Pt. informed if she does not show for her appt tomorrow, provider will not be prescribing any further pain management meds until she is able to come to the office.   Pt. Reminded of transportation need.  Pt. Informed mag. Sent to Team PSS for scheduling and should receive a confirmation call this afternoon.  Pt. States verbal understanding.

## 2018-11-29 NOTE — Telephone Encounter (Signed)
Left msg for pt. To return Call to SIM office. Call back number provided.  Will attempt to reach pt. At a later time.

## 2018-11-30 ENCOUNTER — Ambulatory Visit: Payer: Medicaid (Managed Care) | Admitting: Gerontology

## 2018-12-13 ENCOUNTER — Other Ambulatory Visit: Payer: Self-pay | Admitting: Internal Medicine

## 2018-12-13 NOTE — Progress Notes (Signed)
Pt is overdue for pneumovax vaccine.

## 2018-12-14 ENCOUNTER — Ambulatory Visit: Payer: Medicaid (Managed Care) | Admitting: Student in an Organized Health Care Education/Training Program

## 2018-12-14 ENCOUNTER — Telehealth: Payer: Self-pay | Admitting: Internal Medicine

## 2018-12-14 NOTE — Telephone Encounter (Signed)
SW called [employer] and was able to schedule transportation for pt to get to her upcoming appointment at [facility].  SW called and left voicemail for pt providing her with trip details.  SW also informed her to call [employer] to inquire about how many people are able to come in cab with her due to original request stating pt is bringing fiance and 3 children.  SW also left contact number for return call.    Chief of Staff: [employer] Jacquiline Doe Date: 12/18/18  Pickup Location: [address], [place] [address]  Arlyss Gandy Time: 7:45 am  Appt Location: [address]. [place], [place] [address]  Appt Time: 8:50 am  Payer: MAS  Invoice: [phone]    [place]  [facility] Internal Medicine Social Worker  [phone]

## 2018-12-14 NOTE — Telephone Encounter (Signed)
Copied from [place] [phone]. Topic: Information Request - Transportation Information  >> Dec 14, 2018  9:34 AM Brendia Sacks wrote:  Patient is requesting transportation to their upcoming appointment on Monday 12/18/2018 at 8:50 am SIM    Is the patient experiencing any symptoms? no    Pick up patient at (verified) home address? yes    Special accommodations needed? Fiance and 3 children    Preferred transportation company?[employer]    Patient contact number Telephone Information:  Mobile          [phone]

## 2018-12-18 ENCOUNTER — Ambulatory Visit: Payer: Medicaid (Managed Care) | Admitting: Student in an Organized Health Care Education/Training Program

## 2018-12-19 ENCOUNTER — Telehealth: Payer: Self-pay | Admitting: Internal Medicine

## 2018-12-19 NOTE — Telephone Encounter (Signed)
Copied from [place] [id number]. Topic: Appointments - Schedule Appointment  >> Dec 19, 2018  1:02 PM Aurther Loft wrote:  Patient called in regards to scheduling appointment in regards to clearance to surgery,appointment is scheduled for 10/12 at 2 pm, also patient would like transportation scheduled for appointment as well. Patients best contact number is [phone]

## 2018-12-22 ENCOUNTER — Telehealth: Payer: Self-pay

## 2018-12-22 NOTE — Telephone Encounter (Signed)
[employer] 2015 form approved until 06/22/2019.     Call to pt to schedule transportation.     Jenna [name] is scheduled for a Primary Care Office appointment on 12/25/2018 at 2:00pm.   At [employer] will pick up [name] at [place], [place] and take them to [address] [place] will pick up [name] at [place] and take them to [name], [place] may contact [name] at [phone] for changes to pick-up and return times prior to this trip.    Date: 12/25/2018 Time: 2:00pm Inv# [id number]      Trip was already scheduled by SW. Pt is aware of transportation arrangements.

## 2018-12-22 NOTE — Telephone Encounter (Signed)
Pt does not have a valid 2015 form on file. Routing to Dr. Janan Halter for advisement.

## 2018-12-22 NOTE — Telephone Encounter (Signed)
I will give it to the grouper to complete/sign this afternoon. Thank you    Dr. Mardelle Matte I am placing a 2015 form in your folder for this pt. Please fill this out ASAP, pt needs transportation on Monday     Thank you!  Levi Aland

## 2018-12-22 NOTE — Telephone Encounter (Signed)
Completed and handed to [facility]

## 2018-12-22 NOTE — Telephone Encounter (Signed)
RE: Trip Confirmation For December 25, 2018        Arrielle Mcginn is scheduled for a Primary Care Office appointment on 12/25/2018 at 2:00pm.        At 1:00pm [employer] will pick up [name] at [place], [place] and take them to [address] [place]        Once appointment is over [employer] can be called and they will pick up [name] at [address], [place] and take them to [place], [place] can be reached at [phone] for changes to pick-up and return times prior to this trip.      This transport was arranged using [employer]'s on-line scheduling system with invoice # [id number]    RE: Housing Concerns          Per patient she has been sent several certified letters from her landlord indicating that she needs to vacate her home on December 01. She does not indicate that there has been any legal steps taken thus Probation officer provided her the phone number for Lennie Odor the lawyer with our Medical/legal collaborative. Writer has asked her to call him ASAP about this issue.    RE: RG&E, [employer] Benefits etc...          On Monday when patient is in clinic for her appointemnt wrtier and patietn will meet to discuss these issues.

## 2018-12-25 ENCOUNTER — Telehealth: Payer: Self-pay | Admitting: Internal Medicine

## 2018-12-25 ENCOUNTER — Ambulatory Visit: Payer: Medicaid (Managed Care) | Admitting: Student in an Organized Health Care Education/Training Program

## 2018-12-25 NOTE — Telephone Encounter (Signed)
Copied from [place]. Topic: Information Request - Transportation Information  >> Dec 25, 2018  1:03 PM Aurther Loft wrote:  Patient is requesting transportation to their upcoming appointment on 10/12 at 2 pm     Is the patient experiencing any symptoms? no  If yes, the symptoms are: n/a.    Pick up patient at (verified) home address? yes  If no, patient's pick up address is [address]   [place] [address]    Special accommodations needed? no    Preferred transportation company?[employer] or all around     Patient contact number [phone]    Patient stated she needs a ride for 2 adults and 3 children for appointment today at 2 pm

## 2018-12-27 ENCOUNTER — Other Ambulatory Visit
Admission: RE | Admit: 2018-12-27 | Discharge: 2018-12-27 | Disposition: A | Discharge status: Home / Self Care | Payer: Medicaid (Managed Care) | Source: Ambulatory Visit | Attending: Internal Medicine | Admitting: Internal Medicine

## 2018-12-27 ENCOUNTER — Encounter: Payer: Self-pay | Admitting: Internal Medicine

## 2018-12-27 ENCOUNTER — Ambulatory Visit: Payer: Medicaid (Managed Care) | Admitting: Internal Medicine

## 2018-12-27 VITALS — BP 135/59 | HR 70 | Temp 98.1°F | Ht 65.0 in | Wt 275.0 lb

## 2018-12-27 DIAGNOSIS — J4550 Severe persistent asthma, uncomplicated: Secondary | ICD-10-CM

## 2018-12-27 DIAGNOSIS — M545 Low back pain, unspecified: Secondary | ICD-10-CM

## 2018-12-27 DIAGNOSIS — D649 Anemia, unspecified: Secondary | ICD-10-CM

## 2018-12-27 DIAGNOSIS — L732 Hidradenitis suppurativa: Secondary | ICD-10-CM

## 2018-12-27 DIAGNOSIS — K8050 Calculus of bile duct without cholangitis or cholecystitis without obstruction: Secondary | ICD-10-CM

## 2018-12-27 DIAGNOSIS — K8020 Calculus of gallbladder without cholecystitis without obstruction: Secondary | ICD-10-CM

## 2018-12-27 LAB — CBC AND DIFFERENTIAL
Baso # K/uL: 0.1 10*3/uL (ref 0.0–0.1)
Basophil %: 0.6 %
Eos # K/uL: 0.2 10*3/uL (ref 0.0–0.4)
Eosinophil %: 2.1 %
Hematocrit: 27 % — ABNORMAL LOW (ref 34–45)
Hemoglobin: 7.2 g/dL — ABNORMAL LOW (ref 11.2–15.7)
IMM Granulocytes #: 0.1 10*3/uL — ABNORMAL HIGH (ref 0.0–0.0)
IMM Granulocytes: 0.4 %
Lymph # K/uL: 2.9 10*3/uL (ref 1.2–3.7)
Lymphocyte %: 24.8 %
MCH: 21 pg/cell — ABNORMAL LOW (ref 26–32)
MCHC: 27 g/dL — ABNORMAL LOW (ref 32–36)
MCV: 79 fL (ref 79–95)
Mono # K/uL: 0.6 10*3/uL (ref 0.2–0.9)
Monocyte %: 5.1 %
Neut # K/uL: 7.7 10*3/uL — ABNORMAL HIGH (ref 1.6–6.1)
Nucl RBC # K/uL: 0 10*3/uL (ref 0.0–0.0)
Nucl RBC %: 0 /100 WBC (ref 0.0–0.2)
Platelets: 401 10*3/uL — ABNORMAL HIGH (ref 160–370)
RBC: 3.4 MIL/uL — ABNORMAL LOW (ref 3.9–5.2)
RDW: 19.5 % — ABNORMAL HIGH (ref 11.7–14.4)
Seg Neut %: 67 %
WBC: 11.5 10*3/uL — ABNORMAL HIGH (ref 4.0–10.0)

## 2018-12-27 LAB — COMPREHENSIVE METABOLIC PANEL
ALT: 13 U/L (ref 0–35)
AST: 14 U/L (ref 0–35)
Albumin: 4.5 g/dL (ref 3.5–5.2)
Alk Phos: 80 U/L (ref 35–105)
Anion Gap: 9 (ref 7–16)
Bilirubin,Total: <0.2 mg/dL (ref 0.0–1.2)
CO2: 24 mmol/L (ref 20–28)
Calcium: 9.4 mg/dL (ref 8.8–10.2)
Chloride: 106 mmol/L (ref 96–108)
Creatinine: 1 mg/dL — ABNORMAL HIGH (ref 0.51–0.95)
GFR,Black: 85 *
GFR,Caucasian: 74 *
Glucose: 104 mg/dL — ABNORMAL HIGH (ref 60–99)
Lab: 19 mg/dL (ref 6–20)
Potassium: 4.2 mmol/L (ref 3.3–5.1)
Sodium: 139 mmol/L (ref 133–145)
Total Protein: 7.5 g/dL (ref 6.3–7.7)

## 2018-12-27 LAB — FERRITIN: Ferritin: 5 ng/mL — ABNORMAL LOW (ref 10–120)

## 2018-12-27 LAB — TIBC
Iron: 18 ug/dL — ABNORMAL LOW (ref 34–165)
TIBC: 495 ug/dL — ABNORMAL HIGH (ref 250–450)
Transferrin Saturation: 4 % — ABNORMAL LOW (ref 15–50)

## 2018-12-27 MED ORDER — BUDESONIDE-FORMOTEROL FUMARATE 160-4.5 MCG/ACT IN AERO *I*
2.0000 | INHALATION_SPRAY | Freq: Two times a day (BID) | RESPIRATORY_TRACT | 5 refills | Status: DC
Start: 2018-12-27 — End: 2019-12-03

## 2018-12-27 MED ORDER — CLINDAMYCIN PHOSPHATE 1 % EX GEL *I*
Freq: Two times a day (BID) | CUTANEOUS | 0 refills | Status: DC
Start: 2018-12-27 — End: 2020-04-02

## 2018-12-27 MED ORDER — ALBUTEROL SULFATE HFA 108 (90 BASE) MCG/ACT IN AERS *I*
1.0000 | INHALATION_SPRAY | Freq: Four times a day (QID) | RESPIRATORY_TRACT | 3 refills | Status: DC | PRN
Start: 2018-12-27 — End: 2019-12-03

## 2018-12-27 MED ORDER — AMOXICILLIN-POT CLAVULANATE 875-125 MG PO TABS *I*
1.0000 | ORAL_TABLET | Freq: Two times a day (BID) | ORAL | 0 refills | Status: AC
Start: 2018-12-27 — End: 2019-01-03

## 2018-12-27 MED ORDER — TRAMADOL HCL 50 MG PO TABS *I*
50.0000 mg | ORAL_TABLET | Freq: Two times a day (BID) | ORAL | 0 refills | Status: AC | PRN
Start: 2018-12-27 — End: 2019-01-03

## 2018-12-27 NOTE — Telephone Encounter (Signed)
Jenna [name] is scheduled for a Primary Care Office appointment on 12/27/2018 at 2:30pm.   At [place] will pick up Jenna [name] at [place], [place] and take them to [address], [place] will pick up Jenna [name] at [place] and take them to [place], [place] may contact [place] at [phone] for changes to pick-up and return times prior to this trip.    Date: 12/27/2018 Time: 2:30pm Inv# [id number]    Pt is aware of transportation arrangements.

## 2018-12-27 NOTE — Patient Instructions (Addendum)
Please call [phone]  To schedule an appointment with your surgeon for your gallbladder.       Please call [phone] to schedule an appointment with your plastic surgeon

## 2018-12-27 NOTE — Progress Notes (Signed)
[facility] Follow-up Note     Reason For Visit:   Follow Up    HPI:      Jenna [name] is 33 y.o. year old female  coming in to discuss the following issues:    1. Anemia- Jenna [name] has a history of severe iron deficiency anemia requiring blood transfusions. This has been attributed to heavy periods which she continues to have, most recent menses was last week. She has no showed multiple recent GYN appointments to discuss her heavy bleeding.  Last CBC 9/9 showed H/H of 7.7/28. Jenna [name] has been taking a daily iron supplement. Today she does report some increased fatigue and some SOB. She also reports being out of her inhalers so this may be the cause of her SOB.    2. Gallbladder- First diagnosed in Dec 2019. Since diagnosis Jenna [name] has no showed multiple bariatrics appointments and canceled a scheduled surgery. She additionally declined an emergent surgery when seen at [facility] ED 9/9. Currently she does have RUQ pain after eating. No pain with drinking fluids, she has been staying well hydrated. She denies fever/chill and vomiting. No black or bloody stools. She has been alternating tylenol up to 10 (579m) tablets and ibuprofen 2026mfor the pain.     3. Back pain- Jenna [name] low back and neck pain that can have a shooting quality. This pain comes and goes and has been present for over a year. No known injury to the back or neck. No radiation to her legs, no saddle anesthesia or bowel/blader dysfunction.    4. Mental Health- [facility] a counselor (Jenna [name]) [facility], she feels overwhelmed at times but this is her normal with 3 young children. Denies SI/HI. Has support from her boyfriends sisters.     5. Hidradenitis Suppurativa- Jenna [name] an ongoing bad flare of her HS. She has drainage in both axilla. In the past oral antibiotic have helped to improve symptoms. She states Augmentin was the most beneficial. She has met  with a plastic surgeon for this diagnosis and they discussed surgical excision after her pregnancy, unfortunately she did not follow up with them.    Review of Systems:     12 point ROS negative except for HPI above.     Medications/Allergies/Immunizations:     Medication list reviewed and reconciled this visit in the EMR. Allergy list confirmed in the EMR.    Current Outpatient Medications   Medication Sig    budesonide-formoterol (SYMBICORT) 160-4.5 MCG/ACT inhaler Inhale 2 puffs into the lungs 2 times daily Shake well before each use.    albuterol HFA (PROVENTIL, VENTOLIN, PROAIR HFA) 108 (90 Base) MCG/ACT inhaler Inhale 1-2 puffs into the lungs every 6 hours as needed for Wheezing Shake well before each use.    traMADol (ULTRAM) 50 MG tablet Take 1 tablet (50 mg total) by mouth 2 times daily as needed for Pain for up to 7 days Max daily dose: 100 mg    amoxicillin-clavulanate (AUGMENTIN) 875-125 MG per tablet Take 1 tablet by mouth 2 times daily for 7 days    naproxen (NAPROSYN) 500 MG tablet Take 1 tablet (500 mg total) by mouth 2 times daily (with meals)    acetaminophen (TYLENOL) 325 MG tablet Take 2 tablets (650 mg total) by mouth every 4 hours as needed    amLODIPine (NORVASC) 5 MG tablet Take 5 mg by mouth daily    docusate sodium (COLACE)  100 MG capsule Take 100 mg by mouth 3 times daily    ferrous gluconate (FERGON) 324 (38 Fe) MG tablet Take 324 mg by mouth    FLUoxetine (PROZAC) 20 MG capsule Take 20 mg by mouth daily    albuterol-ipratropium (COMBIVENT RESPIMAT) 20-100 MCG/ACT inhaler Inhale 1 puff into the lungs    nicotine (NICODERM CQ) 7 MG/24HR patch Place 1 patch onto the skin every 24 hours    phenazopyridine (PYRIDIUM) 100 MG tablet Take 100 mg by mouth    QUEtiapine (SEROQUEL) 200 MG tablet Take 200 mg by mouth daily    ursodiol (ACTIGALL) 300 MG capsule Take 2 capsules (600 mg total) by mouth 2 times daily    metoprolol (TOPROL-XL) 25 MG 24 hr tablet Take 1 tablet (25 mg  total) by mouth daily Do not crush or chew. May be divided. (Patient not taking: Reported on 07/20/2018)    blood pressure monitor Use as directed (Patient not taking: Reported on 07/20/2018)         Past Medical History/Family History/Social History:     Past Medical History, Family History, and Social History reviewed, confirmed, and updated as appropriate this visit in the EMR.     Physical Exam:     Vitals:    12/27/18 1502   BP: 135/59   Pulse: 70   Temp: 36.7 C (98.1 F)   TempSrc: Temporal   Weight: 124.7 kg (275 lb)   Height: 1.651 m (5' 5" )       BP Readings from Last 3 Encounters:   12/27/18 135/59   05/10/18 140/84   11/19/17 119/57     Wt Readings from Last 3 Encounters:   12/27/18 124.7 kg (275 lb)   05/10/18 128 kg (282 lb 3.2 oz)   11/16/17 137.4 kg (303 lb)       Vitals Signs: XTK:WIOX mass index is 45.76 kg/m., otherwise as above, reviewed with patient    Physical Exam   Constitutional: She is oriented to person, place, and time. She appears well-developed and well-nourished. No distress.   Cardiovascular: Normal rate, regular rhythm and normal heart sounds.   Pulmonary/Chest: Effort normal and breath sounds normal. No respiratory distress. She has no wheezes. She has no rales.   Abdominal: Soft. Bowel sounds are normal. She exhibits no distension. There is no rebound and no guarding.   RUQ tenderness to palaption   Neurological: She is alert and oriented to person, place, and time.   Skin: Skin is warm and dry. She is not diaphoretic.   Psychiatric: She has a normal mood and affect. Her behavior is normal.         Assessment and Plan:     Jenna [name] has multiple medical issues that require timely management. Unfortunately her frequent no shows and cancellations have made it difficult for her to receive appropriate care. She reports her barriers to showing up for visits are child care and being busy. She does understand that negative impact this is having on her health. She states her boyfriends sisters  maybe bale to assist with child care.    Severe persistent asthma without complication  Will refill inhalers for patient to restart.  -     budesonide-formoterol (SYMBICORT) 160-4.5 MCG/ACT inhaler; Inhale 2 puffs into the lungs 2 times daily Shake well before each use.  -     albuterol HFA (PROVENTIL, VENTOLIN, PROAIR HFA) 108 (90 Base) MCG/ACT inhaler; Inhale 1-2 puffs into the lungs every 6 hours as needed for  Wheezing Shake well before each use.    Anemia, unspecified type  I am concerned that Jenna [name] may require another transfusion. Will check CBC today and may need to send patient to ED (she prefers [facility]) for transfusion depending on results. Jenna [name] is understanding and agreeable to this. I did discuss with her the option of sending her down to [facility] ED given her symptoms of anemia and her gallbladder concerns. She declined at this time as she had her daughter with her and wanted to go home.  -     CBC and differential; Future  -     Ferritin; Future  -     TIBC; Future    Low back pain  Unable to fully assess today given time constraints. X-rays ordered and will discuss further at next OV.    -     Spine lumbar  AP, Lateral, Oblique views; Future  -     * Spine cervical standard AP, Lateral, Swimmers, Odontoid; Future    Cholelithiasis  Jenna [name] continues to have pain following meals but has been able to keep small meals and fluids down. She has no fever or chills and appears well. I advised her that her pain will not be well controlled until she has her gallbladder removed. I will write a one week supply of tramadol but this is not a long term solution. She is agreeable to calling to reschedule her surgery and if symptoms worsen in the meantime will present to the ED. I specifically advised her to monitor for fever, continued vomiting or worsening RUQ pain and present to ED with these symptoms.     -     traMADol (ULTRAM) 50 MG tablet; Take 1 tablet (50 mg total) by mouth 2 times daily as needed for Pain for up  to 7 days Max daily dose: 100 mg    Hidradenitis suppurativa  Multiple drainage tracts in axilla, no areas of fluctuance. Will treat with a short course of oral antibiotics and then switch to topical antibiotics. Recommended patient follow up with plastic surgery.   -     amoxicillin-clavulanate (AUGMENTIN) 875-125 MG per tablet; Take 1 tablet by mouth 2 times daily for 7 days  - Clindamycin  1% gel     60 min OV greater than 50% spent counseling the above issues.     Handouts Given: Patient educational materials distributed by print-out and/or inserted into AVS   Patient Instructions   Please call [phone]  To schedule an appointment with your surgeon for your gallbladder.       Please call [phone] to schedule an appointment with your plastic surgeon        Follow-up:   RTC:  One week  Author:   Beverely Risen, PA     12/27/2018 5:11 PM

## 2018-12-27 NOTE — Telephone Encounter (Signed)
Copied from [place] [phone]. Topic: Access to Care - Refer to Specialty or Service  >> Dec 27, 2018  9:13 AM Tilden Dome wrote:  Patient is requesting transportation to their upcoming appointment on 10/14 at 2:40 pm [facility]    Patient did not have enough leeway to set the transportation on her own, appt scheduled by office on 10/12    Is the patient experiencing any symptoms? no    Pick up patient at (verified) home address? yes    Special accommodations needed?   5 passengers: 2 adults and 3 children.     Preferred transportation company?[employer]     Patient contact number [phone] and is requesting a return call.

## 2018-12-28 ENCOUNTER — Other Ambulatory Visit: Payer: Self-pay | Admitting: Internal Medicine

## 2018-12-28 DIAGNOSIS — K8050 Calculus of bile duct without cholangitis or cholecystitis without obstruction: Secondary | ICD-10-CM

## 2018-12-28 DIAGNOSIS — D509 Iron deficiency anemia, unspecified: Secondary | ICD-10-CM

## 2018-12-28 LAB — LIPASE: Lipase: 20 U/L (ref 13–60)

## 2018-12-29 ENCOUNTER — Telehealth: Payer: Self-pay | Admitting: Internal Medicine

## 2018-12-29 NOTE — Telephone Encounter (Signed)
Call to pt, her phone went straight to voicemail.     Writer LVM informing pt we would like her to come in next week and to call our office back to schedule.

## 2018-12-29 NOTE — Telephone Encounter (Addendum)
Called and spoke to pt    Writer relayed message below from [name], [place].    Pt informed of the following:  - urgent referral placed to hematology, pt will await their call to do iron infusion  - urgent referral to general surgery, pt could not take their office number at the moment - will wait for their call instead  - need to schedule FUV with Curt Bears or CCP in 1-2 weeks    Pt verbalized understanding and agreeable to :  - go to the ED if pt presents fever, chills, nausea, vomiting or persistent RUQ pain  - call office to schedule FUV    Pt mentions she won't be able to go to [facility] ED on Sunday for blood transfusion recommended by Curt Bears, as pt has to take care of her niece and nephew. Pt will just wait to schedule appointment with Hematology.    Writer will send to team nurse to follow-up.

## 2018-12-29 NOTE — Telephone Encounter (Signed)
-----   Message from Beverely Risen, [place] sent at 12/29/2018  9:51 AM EDT -----  Please let patient know her BW does show serious iron deficiency anemia. I have placed an urgent referral to hematology, they maybe able to do iron infusions for her.  I also placed an urgent referral to general surgery to discuss rescheduling her gallbladder removal. She can also call them at [phone] to schedule. As I discussed with her in office if she develops fever/chills, nausea/vomiting or persistent RUQ pain she needs to present to the ED.  Please have her schedule follow up with me or CCP in 1-2 weeks.

## 2019-01-01 NOTE — Telephone Encounter (Signed)
Call to pt, she is scheduled to see Curt Bears, [place] on Wednesday 10/21 at 3:20pm.

## 2019-01-02 ENCOUNTER — Encounter: Payer: Self-pay | Admitting: Gastroenterology

## 2019-01-02 LAB — UNMAPPED LAB RESULTS
ABO RH Blood Type (HT): A POS — NL
Antibody Screen (HT): NEGATIVE — NL
Basophil # (HT): 0.1 10 3/uL — NL (ref 0.0–0.2)
Basophil % (HT): 1 % — NL (ref 0–3)
Eosinophil # (HT): 0.3 10 3/uL — NL (ref 0.0–0.6)
Eosinophil % (HT): 3 % — NL (ref 0–5)
Hematocrit (HT): 26 % — ABNORMAL LOW (ref 35–47)
Hemoglobin (HGB) (HT): 6.9 g/dL — ABNORMAL LOW (ref 12.0–16.0)
Lymphocyte # (HT): 2.5 10 3/uL — NL (ref 1.0–4.8)
Lymphocyte % (HT): 24 % — NL (ref 15–45)
MCHC (HT): 26.3 g/dL — ABNORMAL LOW (ref 31.0–37.5)
MCV (HT): 79 fL — ABNORMAL LOW (ref 80–100)
Mean Corpuscular Hemoglobin (MCH) (HT): 20.8 pg — ABNORMAL LOW (ref 26.0–34.0)
Monocyte # (HT): 0.7 10 3/uL — NL (ref 0.1–1.0)
Monocyte % (HT): 6 % — NL (ref 0–15)
Neutrophil # (HT): 6.8 10 3/uL — NL (ref 1.8–8.0)
Platelets (HT): 412 10 3/uL — NL (ref 150–450)
RBC (HT): 3.32 10 6/uL — ABNORMAL LOW (ref 3.80–5.20)
RDW (HT): 18.7 % — ABNORMAL HIGH (ref 0.0–15.2)
Seg Neut % (HT): 65 % — NL (ref 45–75)
WBC (HT): 10.5 10 3/uL — NL (ref 4.0–11.0)

## 2019-01-03 ENCOUNTER — Ambulatory Visit: Payer: Medicaid (Managed Care) | Admitting: Internal Medicine

## 2019-01-10 ENCOUNTER — Ambulatory Visit: Payer: Medicaid (Managed Care) | Admitting: Internal Medicine

## 2019-01-18 ENCOUNTER — Telehealth: Payer: Self-pay | Admitting: Internal Medicine

## 2019-01-18 NOTE — Telephone Encounter (Signed)
Jenna [name], you saw pt on 10/14. Her surgery is scheduled for December. Does she need an appointment in order to get medication?    Dr. Christell Constant you in

## 2019-01-18 NOTE — Telephone Encounter (Signed)
Jenna [name] is scheduled for a Primary Care Office appointment on 01/19/2019 at 2:30pm.   At [place] will pick up [name] at [place], [place] and take them to [address] [place] will pick up [name] at [place] and take them to [place], [place] may contact [place] at [phone] for changes to pick-up and return times prior to this trip.    Date: 01/19/2019 Time: 2:30pm Inv# [id number]    Pt is aware of transportation arrangements.

## 2019-01-18 NOTE — Telephone Encounter (Signed)
Copied from [place] [phone] [phone]. Topic: Medications/Prescriptions - Medication Question/Problem  >> Jan 18, 2019 10:50 AM Verda Cumins wrote:  Jenna [name] is requesting pain medication.  She states she has to have her gallbladder removed and is having 9/10 pain.    Sibbie's contact number is [phone]

## 2019-01-19 ENCOUNTER — Ambulatory Visit: Payer: Medicaid (Managed Care) | Admitting: Internal Medicine

## 2019-01-19 ENCOUNTER — Telehealth: Payer: Self-pay
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
## 2019-01-19 NOTE — Telephone Encounter (Signed)
Called pt to confirm upcoming appt with hematology.  No answer, left message with date/time, requested call back for questions or if unable to attend with CC number.

## 2019-01-25 ENCOUNTER — Telehealth: Payer: Self-pay | Admitting: Internal Medicine
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
## 2019-01-25 NOTE — Telephone Encounter (Signed)
Spoke to pt.  Pt. C/o 10/10 gall bladder pain.  Pt. Requesting pain medication.  Pt. Informed she needs office visit for evaluation.  Pt. Reports she has not called General Surgery to notify of pain/gallbladder as pt. Scheduled for Cholecystectomy on 02/26/19.  Pt. States she can't wait, needs gall bladder out now.  Pt. States she is going to [facility] ED for evaluation.  Pt. States verbal understanding.

## 2019-01-25 NOTE — Telephone Encounter (Signed)
Copied from [place] [phone]. Topic: Access to Care - Speak to Provider/Office Staff  >> Jan 25, 2019  9:29 AM Kerby Nora wrote:  Ms. Sando calling to report that she is experiencing gall bladder pain. These symptoms have been going on for 1 day(s)    Ms. Lees is also experiencing extreme pain.  she rates this pain a 10/10.      Patient requesting same day appointment? No, but is requesting medication(s) to treat the symptoms and the pain.    Phone number confirmed at [phone] .

## 2019-01-26 ENCOUNTER — Ambulatory Visit: Payer: Medicaid (Managed Care) | Admitting: Hematology & Oncology

## 2019-01-26 NOTE — Progress Notes (Signed)
No show.  Waited 15 minutes on Zoom.

## 2019-01-30 ENCOUNTER — Telehealth: Payer: Self-pay | Admitting: Internal Medicine

## 2019-01-30 NOTE — Telephone Encounter (Signed)
Call to pt to reschedule missed appointment on 11/6. Pt is rescheduled for Monday 11/23 with Dr. Sharlyne Cai. Pt notified that she needs to call [facility] to schedule her transportation. Pt no showed last 3 appointments with transportation scheduled by our office.   Pt verbalized understanding.

## 2019-02-05 ENCOUNTER — Ambulatory Visit: Payer: Medicaid (Managed Care) | Admitting: Student in an Organized Health Care Education/Training Program

## 2019-02-16 ENCOUNTER — Other Ambulatory Visit: Payer: Self-pay | Admitting: Surgery

## 2019-02-16 ENCOUNTER — Telehealth: Payer: Self-pay | Admitting: Internal Medicine

## 2019-02-16 DIAGNOSIS — Z01818 Encounter for other preprocedural examination: Secondary | ICD-10-CM

## 2019-02-16 NOTE — Telephone Encounter (Signed)
Copied from [place] [phone]. Topic: Appointments - Schedule Appointment  >> Feb 16, 2019 11:13 AM Jenna [name] wrote:  Patient is requesting transportation to their upcoming appointment on 12/8 at [facility] at 1:40 PM.     Writer advised her to reach out to [facility] directly to schedule transportation for herself. She states she is sick and it's difficult for her to schedule herself with the prompts. She states office usually schedules it for her.    Is the patient experiencing any symptoms? no    Pick up patient at (verified) home address? yes    Special accommodations needed? No    Preferred transportation company?[employer]     Patient contact number [phone]

## 2019-02-16 NOTE — Telephone Encounter (Signed)
Jenna [name] is scheduled for a Primary Care Office appointment on 02/20/2019 at 1:30pm.   At 12:30pm[facility] up Jenna [name] at [place] and take them to [address], [employer] will pick up Jenna [name] at [place] and take them to [place], [place] may contact [employer] [place] at [phone] for changes to pick-up and return times prior to this trip.    Date: 02/20/2019 Time: 1:30pm Inv# [phone]    Pt is aware of transportation arrangements.

## 2019-02-20 ENCOUNTER — Ambulatory Visit: Payer: Medicaid (Managed Care) | Admitting: Internal Medicine

## 2019-02-23 ENCOUNTER — Encounter: Payer: Self-pay | Admitting: General Surgery

## 2019-02-23 ENCOUNTER — Other Ambulatory Visit: Payer: Self-pay | Admitting: Surgery

## 2019-02-23 DIAGNOSIS — D649 Anemia, unspecified: Secondary | ICD-10-CM

## 2019-02-23 NOTE — Telephone (Signed)
Patient's phone call was dropped.  This Probation officer attempted a call back twice, went straight to voicemail.  Pre-op screening call was not completed.~SW
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
## 2019-02-26 ENCOUNTER — Telehealth: Payer: Self-pay

## 2019-02-26 ENCOUNTER — Ambulatory Visit: Payer: Medicaid (Managed Care)

## 2019-02-26 NOTE — H&P (Signed)
UPDATES TO PATIENT'S CONDITION on the DAY OF SURGERY/PROCEDURE    I. Updates to Patient's Condition (to be completed by a provider privileged to complete a H&P, following reassessment of the patient by the provider):    Day of Surgery/Procedure Update:  History  History reviewed and no change    Physical  Physical exam updated and no change            II. Procedure Readiness   I have reviewed the patient's H&P and updated condition. By completing and signing this form, I attest that this patient is ready for surgery/procedure.    III. Attestation   I have reviewed the updated information regarding the patient's condition and it is appropriate to proceed with the planned surgery/procedure.    I have reminded Ms. Frieze that COVID-19 is still present in our community. She was advised that [facility] and its affiliates have made deliberate and widespread changes to policies and procedures, consistent with applicable directives, in order to reduce the risk of exposure in our facilities.     I further explained and Ms. Cochrane understands that given the communicability of the SARS CoV2 coronavirus, there remains a small but real risk of contracting the disease while receiving perioperative care - even with stringent preventive measures in place.   Ms. Heinrich understands the potential consequences of COVID-19 disease as it relates to their planned procedure and anticipated postoperative course.      The patient and I have considered and discussed the relative risks and benefits of proceeding with his/her surgery - both in terms of the procedure itself, and also in the context of the ongoing pandemic.  Ms. Rehmann wishes to proceed with the procedure.     Howard Patton E Etheleen Valtierra, MD as of 7:57 AM 02/26/2019

## 2019-02-26 NOTE — Telephone Encounter (Signed)
-----   Message from Ardath Sax sent at 02/26/2019 11:06 AM EST -----  Regarding: lap chole  In pain and would like some recommendations

## 2019-02-26 NOTE — Telephone Encounter (Signed)
Returned call to pt, left message to call.

## 2019-03-13 ENCOUNTER — Telehealth: Payer: Medicaid (Managed Care) | Admitting: Surgery

## 2019-03-21 ENCOUNTER — Other Ambulatory Visit: Payer: Self-pay

## 2019-03-21 DIAGNOSIS — Z01818 Encounter for other preprocedural examination: Secondary | ICD-10-CM

## 2019-03-23 NOTE — Telephone (Signed)
Pre-Operative Instructions                     FOLLOW YOUR SURGEON'S INSTRUCTIONS IF DIFFERENT THAN BELOW.                    PRIOR TO SURGERY  Five days before surgery, if surgeon does not specify, please STOP taking:     Anti-inflammatory meds: (Ibuprofen, Motrin, Advil, Mobic, Meloxicam, Aleve, Naproxen, Voltaren, etc.)     Vitamins and herbal supplements, including herbal teas      YOU MAY TAKE ACETAMINOPHEN (TYLENOL) as needed     Directions regarding any prescribed blood thinners, including aspirin, must be approved by your cardiologist or prescribing doctor.    ARRIVAL/SURGICAL TIME     Staff from the [facility] will call you between 130PM and 4PM on the day before surgery to inform you of your arrival and surgery times. This call will be Friday afternoon if your surgery is on a Monday.   Please arrange for transportation to and from the hospital. You must have a responsible adult to stay with you after your surgery if you are going home the same day.   Please know that you should not drive for 24 hours after receiving anesthesia.    DAY BEFORE SURGERY   Keep yourself well hydrated to aid in the placement of your IV and for your general well-being.   Do not eat anything after midnight the night before your surgery (including candy or gum).                                                                                         DAY OF SURGERY   DO NOT CONSUME FOOD OF ANY KIND.     Only Gatorade, clear apple juice or water from midnight until 2 hours before your scheduled surgery.     Please bring photo identification and your insurance card with you for registration.     Be prepared to provide a urine sample on arrival to the preoperative area.       Please wear loose, comfortable clothing and comfortable walking shoes.  Wear something that will easily accommodate a bandage or other type of dressing at your surgical site.     DO NOT WEAR: HEAVY MAKEUP, DARK NAIL POLISH, HAIR  PINS, BODY LOTION OR SCENTS.       Please understand that rings and body piercings need to be removed and left at home.  If they are not removed, your surgery is at risk of being delayed or cancelled.     When you come to [place], please try not bring any valuable items with the exception of your phone.     Valuables such as jewelry, cash and credit cards are best left at home during your stay.  Please send them home with family members.  If you are alone a staff member will assist you in storing your belongings.     [facility] does not assume responsibility for items brought with you on the day of surgery.     If wearing eyeglasses, please bring a case.  DO NOT WEAR CONTACT LENSES.     You may shower, brush your teeth, and use deodorant.     Patient visitation is restricted at this time due to COVID-19 concerns. Your ride will drop you off at the main entrance of the hospital. The staff will call your ride when it is time for you to be discharged. Staff will update your family after surgery to let them know how your are.       MEDICATIONS: DAY OF SURGERY     Take your medications as directed according to your printed, verbal or MyChart instructions.   Anxiety and pain medications may be taken as prescribed at any time prior to arrival.    Medications from the [facility] will be administered to you under the direction of your surgeon. Please leave your prescriptions at home with the exception of inhalers.      AT THE HOSPITAL ON THE DAY OF SURGERY     Enter the building through the Main Lobby.     We  ask that your cell phone be with you and turned on, so that the Preop and OR RNs may phone you directly with instructions on the morning of surgery if necessary, and you can be in communication with your family.     Please stop at the Information Desk for directions to the Surgery Center on [address].      [facility]    The pharmacy is open from 9am to  5:30pm on weekdays and 10am-2pm on Saturdays.     Any prescriptions you may need after your surgery can be filled at the Outpatient Pharmacy in the main lobby:     Pharmacy staff will coordinate obtaining your insurance information and co-payments, which will be identical to those of your home pharmacy.   A credit card can be called in to the pharmacy in advance and stored securely with  encryption, to be used only for medications prescribed for you.   Please have your support person call the pharmacy with a credit card number during your surgery to expedite your discharge from the hospital.   Cash or a check are acceptable forms of payment as well, and payment can be facilitated at the time of discharge by staff members accompanying you to your car.   Prescriptions cannot be filled without payment. Thank you for your consideration.    QUESTIONS?     Question about these instructions? Call [phone], select option 2, and leave a message at any time.   A nurse will return your call during our regular business hours.     Any questions regarding specifics about your surgery or recovery? Please call your surgeons office.

## 2019-03-24 ENCOUNTER — Other Ambulatory Visit: Payer: Self-pay | Admitting: Surgery

## 2019-03-26 ENCOUNTER — Ambulatory Visit: Payer: Medicaid (Managed Care)

## 2019-03-28 NOTE — Telephone Encounter (Signed)
Cancelled patient's surgery for tomorrow as she again did not complete the required lab work and Covid test. When called today re. The missed labwork she told the pretesting department she wanted to reschedule surgery. This is the 3rd time in the last month that she has cancelled the day before or day of surgery. She also cancelled previously in June. Due to her history of non-compliance, Dr. Lawerance Bach is recommending she find a new Careers adviser. I also sent her a my chart message.     Message left on her voicemail that we have cancelled for tomorrow as she did not complete the required labwork and that we are not rescheduling as she's requested at this time as Dr. Lawerance Bach is recommending she find a new provider. She is welcome to call back with any questions.

## 2019-03-29 ENCOUNTER — Telehealth: Payer: Self-pay | Admitting: Internal Medicine

## 2019-03-29 ENCOUNTER — Ambulatory Visit: Admission: RE | Admit: 2019-03-29 | Payer: Medicaid (Managed Care) | Source: Ambulatory Visit | Admitting: Surgery

## 2019-03-29 ENCOUNTER — Ambulatory Visit: Payer: Medicaid (Managed Care)

## 2019-03-29 ENCOUNTER — Encounter: Admission: RE | Payer: Self-pay | Source: Ambulatory Visit

## 2019-03-29 SURGERY — CHOLECYSTECTOMY, LAPAROSCOPIC
Anesthesia: General | Site: Abdomen

## 2019-03-29 NOTE — Telephone Encounter (Signed)
Copied from CRM [id number]. Topic: Medications/Prescriptions - Medication Question/Problem  >> Mar 29, 2019 12:04 PM [name] wrote:  [name] back and side pain past 2 days.  Patient states she has not been able to get in for her gall bladder surgery.  .    Patient in tears states the pain in severe and she needs prescription for ibuprofen or tylenol called in to [facility].    No fever nausea vomiting, no difficulty breathing     Is patient out of the medication? Yes    Patient requesting same day appointment?  No has 7 children can not get in to the office today, no child care.     Patient states she is planning on reporting to emergency room or urgent care later this evening.     Patient requesting a return call?  Yes    Patient can be reached [phone].

## 2019-03-30 MED ORDER — ACETAMINOPHEN 325 MG PO TABS *I*
650.0000 mg | ORAL_TABLET | ORAL | 6 refills | Status: DC | PRN
Start: 2019-03-30 — End: 2020-03-20

## 2019-03-30 MED ORDER — IBUPROFEN 200 MG PO TABS *I*
400.0000 mg | ORAL_TABLET | Freq: Three times a day (TID) | ORAL | 1 refills | Status: DC | PRN
Start: 2019-03-30 — End: 2020-03-20

## 2019-03-30 NOTE — Telephone Encounter (Signed)
Writer called patient in regards to scheduling an appointment for Flank pain per Dr.Logrono    Writer advised patient of Medications for Tylenol and Ibuprofen have been filled by provider     Patient is scheduled with NP Glenetta Hew on 04/06/19

## 2019-03-30 NOTE — Telephone Encounter (Signed)
Vinnie Level, MD  [facility] Chimes Team 1 hour ago (7:43 AM)     Hi team,   I'm not in clinic this block, so I'm not sure how I got this in-basket message. I think this should have gone to the grouper that is actually in clinic. I filled the tylenol and ibuprofen, but it looks like she needs an appointment. She mentioned that she was going to go to the ER or UC. Can someone call her and check in and see how she is doing?   Thanks-Gaby    Routing comment       Peets, Trula Ore, RN  Vinnie Level, MD 20 hours ago (1:09 PM)     Dr. Earlean Polka unavailable- please review for needed refills

## 2019-04-06 ENCOUNTER — Ambulatory Visit: Payer: Medicaid (Managed Care) | Admitting: Internal Medicine

## 2019-04-07 ENCOUNTER — Telehealth: Payer: Medicaid (Managed Care) | Admitting: Surgery

## 2019-04-13 ENCOUNTER — Ambulatory Visit: Payer: Medicaid (Managed Care) | Admitting: Internal Medicine

## 2019-04-30 ENCOUNTER — Other Ambulatory Visit: Payer: Self-pay | Admitting: Gastroenterology

## 2019-04-30 LAB — UNMAPPED LAB RESULTS
Basophil # (HT): 0.1 10 3/uL — NL (ref 0.0–0.2)
Basophil % (HT): 1 % — NL (ref 0–3)
Eosinophil # (HT): 0.2 10 3/uL — NL (ref 0.0–0.6)
Eosinophil % (HT): 2 % — NL (ref 0–5)
Hematocrit (HT): 26 % — ABNORMAL LOW (ref 35–47)
Hemoglobin (HGB) (HT): 6.9 g/dL — ABNORMAL LOW (ref 12.0–16.0)
Lymphocyte # (HT): 2.5 10 3/uL — NL (ref 1.0–4.8)
Lymphocyte % (HT): 22 % — NL (ref 15–45)
MCHC (HT): 26.5 g/dL — ABNORMAL LOW (ref 31.0–37.5)
MCV (HT): 74 fL — ABNORMAL LOW (ref 80–100)
Mean Corpuscular Hemoglobin (MCH) (HT): 19.7 pg — ABNORMAL LOW (ref 26.0–34.0)
Monocyte # (HT): 0.9 10 3/uL — NL (ref 0.1–1.0)
Monocyte % (HT): 8 % — NL (ref 0–15)
Neutrophil # (HT): 7.5 10 3/uL — NL (ref 1.8–8.0)
Platelets (HT): 404 10 3/uL — NL (ref 150–450)
RBC (HT): 3.5 10 6/uL — ABNORMAL LOW (ref 3.80–5.20)
RDW (HT): 17 % — ABNORMAL HIGH (ref 0.0–15.2)
Seg Neut % (HT): 67 % — NL (ref 45–75)
WBC (HT): 11.2 10 3/uL — ABNORMAL HIGH (ref 4.0–11.0)

## 2019-05-01 ENCOUNTER — Encounter: Payer: Self-pay | Admitting: Gastroenterology

## 2019-05-05 ENCOUNTER — Telehealth: Payer: Medicaid (Managed Care) | Admitting: Surgery

## 2019-06-18 ENCOUNTER — Telehealth: Payer: Self-pay | Admitting: Internal Medicine

## 2019-06-18 NOTE — Telephone Encounter (Signed)
Copied from CRM [id number]. Topic: Medications/Prescriptions - Refill Request  >> Jun 18, 2019 12:23 PM Stefanie Libel wrote:  Jenna [name] is calling to see if there is a way she can get a refill of amoxicillin that was prescribed for her HS flair up. Patient states the medication really helped but she thinks another 7 days will help a lot. Patient also stateds that she needs a rescue inhaler prescribed stating that at times even walking up 2-3 stairs leaves her SOB. Patient can be reached at [phone]

## 2019-06-18 NOTE — Telephone Encounter (Signed)
Dialed pt's phone number x2, busy tone.

## 2019-06-19 NOTE — Telephone Encounter (Signed)
Busy tone

## 2019-06-25 ENCOUNTER — Other Ambulatory Visit: Payer: Self-pay | Admitting: Gastroenterology

## 2019-06-25 LAB — UNMAPPED LAB RESULTS
Basophil # (HT): 0.1 10 3/uL — NL (ref 0.0–0.2)
Basophil % (HT): 1 % — NL (ref 0–3)
Eosinophil # (HT): 0.2 10 3/uL — NL (ref 0.0–0.6)
Eosinophil % (HT): 2 % — NL (ref 0–5)
Hematocrit (HT): 26 % — ABNORMAL LOW (ref 35–47)
Hematocrit (HT): 25 % — ABNORMAL LOW (ref 35–47)
Hemoglobin (HGB) (HT): 6.8 g/dL — ABNORMAL LOW (ref 12.0–16.0)
Hemoglobin (HGB) (HT): 6.8 g/dL — ABNORMAL LOW (ref 12.0–16.0)
Lymphocyte # (HT): 3.1 10 3/uL — NL (ref 1.0–4.8)
Lymphocyte % (HT): 22 % — NL (ref 15–45)
MCHC (HT): 26.4 g/dL — ABNORMAL LOW (ref 31.0–37.5)
MCHC (HT): 26.9 g/dL — ABNORMAL LOW (ref 31.0–37.5)
MCV (HT): 76 fL — ABNORMAL LOW (ref 80–100)
MCV (HT): 75 fL — ABNORMAL LOW (ref 80–100)
Mean Corpuscular Hemoglobin (MCH) (HT): 20.1 pg — ABNORMAL LOW (ref 26.0–34.0)
Mean Corpuscular Hemoglobin (MCH) (HT): 20.1 pg — ABNORMAL LOW (ref 26.0–34.0)
Monocyte # (HT): 1.3 10 3/uL — ABNORMAL HIGH (ref 0.1–1.0)
Monocyte % (HT): 9 % — NL (ref 0–15)
Neutrophil # (HT): 9.4 10 3/uL — ABNORMAL HIGH (ref 1.8–8.0)
Platelets (HT): 304 10 3/uL — NL (ref 150–450)
Platelets (HT): 311 10 3/uL — NL (ref 150–450)
RBC (HT): 3.39 10 6/uL — ABNORMAL LOW (ref 3.80–5.20)
RBC (HT): 3.39 10 6/uL — ABNORMAL LOW (ref 3.80–5.20)
RDW (HT): 18.6 % — ABNORMAL HIGH (ref 0.0–15.2)
RDW (HT): 18.7 % — ABNORMAL HIGH (ref 0.0–15.2)
Seg Neut % (HT): 67 % — NL (ref 45–75)
WBC (HT): 14.1 10 3/uL — ABNORMAL HIGH (ref 4.0–11.0)
WBC (HT): 11.1 10 3/uL — ABNORMAL HIGH (ref 4.0–11.0)

## 2019-06-26 LAB — UNMAPPED LAB RESULTS
Basophil # (HT): 0 10 3/uL — NL (ref 0.0–0.2)
Basophil % (HT): 0 % — NL (ref 0–3)
Eosinophil # (HT): 0 10 3/uL — NL (ref 0.0–0.6)
Eosinophil % (HT): 0 % — NL (ref 0–5)
Hematocrit (HT): 24 % — ABNORMAL LOW (ref 35–47)
Hemoglobin (HGB) (HT): 6.5 g/dL — ABNORMAL LOW (ref 12.0–16.0)
Lymphocyte # (HT): 1.9 10 3/uL — NL (ref 1.0–4.8)
Lymphocyte % (HT): 12 % — ABNORMAL LOW (ref 15–45)
MCHC (HT): 26.6 g/dL — ABNORMAL LOW (ref 31.0–37.5)
MCV (HT): 75 fL — ABNORMAL LOW (ref 80–100)
Mean Corpuscular Hemoglobin (MCH) (HT): 19.9 pg — ABNORMAL LOW (ref 26.0–34.0)
Monocyte # (HT): 0 10 3/uL — ABNORMAL LOW (ref 0.1–1.0)
Monocyte % (HT): 0 % — NL (ref 0–15)
Neutrophil # (HT): 13.7 10 3/uL — ABNORMAL HIGH (ref 1.8–8.0)
Platelets (HT): 308 10 3/uL — NL (ref 150–450)
RBC (HT): 3.26 10 6/uL — ABNORMAL LOW (ref 3.80–5.20)
RDW (HT): 18.5 % — ABNORMAL HIGH (ref 0.0–15.2)
Seg Neut % (HT): 88 % — ABNORMAL HIGH (ref 45–75)
WBC (HT): 15.6 10 3/uL — ABNORMAL HIGH (ref 4.0–11.0)

## 2019-06-27 ENCOUNTER — Telehealth: Payer: Self-pay | Admitting: Internal Medicine

## 2019-06-27 NOTE — Telephone Encounter (Signed)
Copied from CRM [id number]. Topic: Access to Care - Speak to Provider/Office Staff  >> Jun 27, 2019 10:50 AM [name] wrote:  Jenna [name] had gallbadder surgery yesterday 06/26/2019.    Patient does not have help at home.    Patient is requesting an aid to help wash herself, go up and down stairs, to get dressed, and bathroom.    Please call Jenna [name] back at [phone].

## 2019-06-27 NOTE — Telephone Encounter (Signed)
Pt has not been seen in the office since October. Pt instructed to call her surgeons office to arrange for services if she is struggling to care for herself.  Pt informed she can come in for a visit and we can arrange this but it will not be immediate.  Pt contacting surgeon.

## 2019-06-29 LAB — UNMAPPED LAB RESULTS
Basophil # (HT): 0.1 10 3/uL — NL (ref 0.0–0.2)
Basophil % (HT): 1 % — NL (ref 0–3)
Eosinophil # (HT): 0.1 10 3/uL — NL (ref 0.0–0.6)
Eosinophil % (HT): 1 % — NL (ref 0–5)
Hematocrit (HT): 25 % — ABNORMAL LOW (ref 35–47)
Hemoglobin (HGB) (HT): 6.7 g/dL — ABNORMAL LOW (ref 12.0–16.0)
Lymphocyte # (HT): 2.7 10 3/uL — NL (ref 1.0–4.8)
Lymphocyte % (HT): 19 % — NL (ref 15–45)
MCHC (HT): 26.5 g/dL — ABNORMAL LOW (ref 31.0–37.5)
MCV (HT): 76 fL — ABNORMAL LOW (ref 80–100)
Mean Corpuscular Hemoglobin (MCH) (HT): 20 pg — ABNORMAL LOW (ref 26.0–34.0)
Monocyte # (HT): 1 10 3/uL — NL (ref 0.1–1.0)
Monocyte % (HT): 7 % — NL (ref 0–15)
Neutrophil # (HT): 10.3 10 3/uL — ABNORMAL HIGH (ref 1.8–8.0)
Platelets (HT): 359 10 3/uL — NL (ref 150–450)
RBC (HT): 3.35 10 6/uL — ABNORMAL LOW (ref 3.80–5.20)
RDW (HT): 18.4 % — ABNORMAL HIGH (ref 0.0–15.2)
Seg Neut % (HT): 72 % — NL (ref 45–75)
WBC (HT): 14.3 10 3/uL — ABNORMAL HIGH (ref 4.0–11.0)

## 2019-07-12 ENCOUNTER — Telehealth: Payer: Self-pay | Admitting: Student in an Organized Health Care Education/Training Program

## 2019-07-12 NOTE — Telephone Encounter (Signed)
Please review outside labs from [facility] ED collected on 06/29/2019.

## 2019-07-13 NOTE — Telephone Encounter (Signed)
Clancy Gourd, RN  [facility] 3 hours ago (7:36 AM)   Jenna [name]  Hi please schedule follow up as below per Dr. Charlynne Pander.     Thanks!    Message text

## 2019-07-13 NOTE — Telephone Encounter (Signed)
Writer called patient in regards to  scheduling an appointment with Dr.Rusnak or PA Shirley Friar unable to leave a voicemail for patient,patient's phone can't accept calls right now to call the office back to get scheduled    Writer sent [employer]

## 2019-07-13 NOTE — Telephone Encounter (Signed)
Randell Loop, MD  You 13 hours ago (5:57 PM)   AK  Hi Mykaela Arena,   Agreed. Don't think there's anything that needs to be acted on. But if you could have the patient schedule a FUV with Dr. Earlean Polka after she sees GI on 5/17 that would be great.     Thanks,   [employer] text    You  Randell Loop, MD 22 hours ago (9:16 AM)   [facility]  Dr. Earlean Polka unavailable. Please review & advise if needed.   Pt had lap-chole and went to ED for JP drain removal / pain. Has GI follow up 5/17. Labs with many abnormalities but does appear that they were reviewed in ED. I'm unsure why this notification was triggered today - ED visit was 4/16.   Thanks,   Financial risk analyst

## 2019-07-24 ENCOUNTER — Telehealth: Payer: Self-pay | Admitting: Internal Medicine
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
## 2019-07-26 ENCOUNTER — Ambulatory Visit: Payer: Medicaid (Managed Care) | Admitting: Student in an Organized Health Care Education/Training Program

## 2019-08-27 ENCOUNTER — Telehealth: Payer: Self-pay
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
## 2019-08-27 NOTE — Progress Notes (Addendum)
Social Work Note  Received a message from [facility] that Ms. Tsao is scheduled for an acute visit on 6/15 and inquired about transportation assistance.  [employer] database reviewed, Ms. Hebard did have a 2015 form on file but it expired in 06/2019 (was completed by PCP's office).  SW did contact [employer] to determine if they would provide a courtesy cab for Ms. Savino to get to her appt.  MAS supervisor acknowledged that MAS form has expired but that [employer] will provide transportation as Ms. Tipping does not live near a bus stop.  MAS supervisor did recommend that pt have her PCP office fill out a new form.  SW contacted Ms. Schaad [phone], she did not answer, message left with transportation arrangements.   Loletta Specter, [place]  [id number] [phone]

## 2019-08-28 ENCOUNTER — Ambulatory Visit: Payer: Medicaid (Managed Care) | Admitting: Obstetrics and Gynecology

## 2019-09-04 ENCOUNTER — Telehealth: Payer: Self-pay | Admitting: Internal Medicine

## 2019-09-04 NOTE — Telephone Encounter (Signed)
Attempted to contact patient but phone is "unavailable" message states to try back later.

## 2019-09-04 NOTE — Telephone Encounter (Signed)
Copied from CRM [id number]. Topic: Return Call - Speak to Provider/Office Staff  >> Sep 04, 2019 10:43 AM Stefanie Libel wrote:  Ms. Lauf calling to report that she is experiencing severe muscle spasms that happen back to back in both feet. These symptoms have been going on for 4 day(s)    Ms. Porcher is also experiencing severe pain.  she rates this pain a 10/10. Patient stated that the pain in her legs started after a spasm in her feet this morning and shot up her calf. Patient described the pain as feeling like her legs were being torn from the knee down.    Patient requesting same day appointment? no    Phone number confirmed at [phone].

## 2019-10-01 ENCOUNTER — Encounter: Payer: Self-pay | Admitting: Internal Medicine

## 2019-10-01 ENCOUNTER — Ambulatory Visit: Payer: Medicaid (Managed Care) | Attending: Internal Medicine | Admitting: Internal Medicine

## 2019-10-01 VITALS — BP 142/86 | HR 76 | Temp 98.4°F | Wt 284.9 lb

## 2019-10-01 DIAGNOSIS — D509 Iron deficiency anemia, unspecified: Secondary | ICD-10-CM

## 2019-10-01 DIAGNOSIS — R300 Dysuria: Secondary | ICD-10-CM | POA: Insufficient documentation

## 2019-10-01 DIAGNOSIS — Z5941 Food insecurity: Secondary | ICD-10-CM

## 2019-10-01 DIAGNOSIS — Z111 Encounter for screening for respiratory tuberculosis: Secondary | ICD-10-CM | POA: Insufficient documentation

## 2019-10-01 DIAGNOSIS — Z594 Lack of adequate food and safe drinking water: Secondary | ICD-10-CM | POA: Insufficient documentation

## 2019-10-01 DIAGNOSIS — Z0000 Encounter for general adult medical examination without abnormal findings: Secondary | ICD-10-CM

## 2019-10-01 DIAGNOSIS — R140 Abdominal distension (gaseous): Secondary | ICD-10-CM | POA: Insufficient documentation

## 2019-10-01 LAB — CBC AND DIFFERENTIAL
Baso # K/uL: 0.1 10*3/uL (ref 0.0–0.1)
Basophil %: 0.7 %
Eos # K/uL: 0.2 10*3/uL (ref 0.0–0.4)
Eosinophil %: 1.7 %
Hematocrit: 25 % — ABNORMAL LOW (ref 34–45)
Hemoglobin: 6.5 g/dL — ABNORMAL LOW (ref 11.2–15.7)
IMM Granulocytes #: 0 10*3/uL (ref 0.0–0.0)
IMM Granulocytes: 0.4 %
Lymph # K/uL: 2.6 10*3/uL (ref 1.2–3.7)
Lymphocyte %: 25.3 %
MCH: 19 pg — ABNORMAL LOW (ref 26–32)
MCHC: 26 g/dL — ABNORMAL LOW (ref 32–36)
MCV: 76 fL — ABNORMAL LOW (ref 79–95)
Mono # K/uL: 0.7 10*3/uL (ref 0.2–0.9)
Monocyte %: 6.7 %
Neut # K/uL: 6.7 10*3/uL — ABNORMAL HIGH (ref 1.6–6.1)
Nucl RBC # K/uL: 0 10*3/uL (ref 0.0–0.0)
Nucl RBC %: 0.2 /100 WBC (ref 0.0–0.2)
Platelets: 419 10*3/uL — ABNORMAL HIGH (ref 160–370)
RBC: 3.4 MIL/uL — ABNORMAL LOW (ref 3.9–5.2)
RDW: 17.1 % — ABNORMAL HIGH (ref 11.7–14.4)
Seg Neut %: 65.2 %
WBC: 10.2 10*3/uL — ABNORMAL HIGH (ref 4.0–10.0)

## 2019-10-01 LAB — COMPREHENSIVE METABOLIC PANEL
ALT: 15 U/L (ref 0–35)
AST: 16 U/L (ref 0–35)
Albumin: 4.4 g/dL (ref 3.5–5.2)
Alk Phos: 89 U/L (ref 35–105)
Anion Gap: 15 (ref 7–16)
Bilirubin,Total: 0.2 mg/dL (ref 0.0–1.2)
CO2: 25 mmol/L (ref 20–28)
Calcium: 9.6 mg/dL (ref 8.8–10.2)
Chloride: 102 mmol/L (ref 96–108)
Creatinine: 1.1 mg/dL — ABNORMAL HIGH (ref 0.51–0.95)
GFR,Black: 76 *
GFR,Caucasian: 66 *
Glucose: 82 mg/dL (ref 60–99)
Lab: 15 mg/dL (ref 6–20)
Potassium: 4.1 mmol/L (ref 3.3–5.1)
Sodium: 142 mmol/L (ref 133–145)
Total Protein: 7.3 g/dL (ref 6.3–7.7)

## 2019-10-01 LAB — URINE MICROSCOPIC (IQ200)

## 2019-10-01 LAB — TIBC
Iron: 15 ug/dL — ABNORMAL LOW (ref 34–165)
TIBC: 498 ug/dL — ABNORMAL HIGH (ref 250–450)
Transferrin Saturation: 3 % — ABNORMAL LOW (ref 15–50)

## 2019-10-01 LAB — URINALYSIS REFLEX TO CULTURE
Blood,UA: NEGATIVE
Glucose,UA: NEGATIVE mg/dL
Leuk Esterase,UA: NEGATIVE
Nitrite,UA: NEGATIVE
Specific Gravity,UA: 1.031 — ABNORMAL HIGH (ref 1.002–1.030)
pH,UA: 6 (ref 5.0–8.0)

## 2019-10-01 NOTE — Patient Instructions (Addendum)
Please call [phone] to set up GYN follow up     Please call  [phone] to set up appointment with your hematologist

## 2019-10-01 NOTE — Progress Notes (Signed)
[facility] Follow-up Note     Reason For Visit:   Physical    HPI:      Jenna [name] is 34 y.o. 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  The patient she will be working with is a 34 year old which she will help with feeding and toileting.  She will not have to do any heavy lifting as he walks independently and can transfer independently as well.  She denies any joint pains, muscle aches or back pains that would interfere with this job.  Mood has been stable. Jenna [name] denies depression or anxiety. Hearing, speech and vision intact.    Overall actually feels that she has been doing better following her gallbladder surgery.  She is no longer having intense abdominal pain.  She does still have occasional bouts of bloating and diarrhea and would like to follow-up with GI about this.  She denies any black or bloody stools and has no nausea or vomiting.    Anemia-initially has not yet followed up with hematology regarding her iron deficiency anemia.  She continues to have heavy periods and most recent appointment with OB/GYN.  She has been taking an iron supplement every other day but is worried that she may be anemic as she has had low energy over the past few weeks to months.     Jenna [name] would be interested in referral to bariatric surgery.  Her obesity is impacting her ability to keep up with her children and she would like to discuss options to address her obesity.  She has not had much change in activity level or diet but would be willing to work on these lifestyle factors with the bariatric group.    Jenna [name] reports mild dysuria and increased urinary frequency over the past few days or weeks.  She denies any foul urine odor or hematuria.  No fever/chills, no pelvic pain or flank pain.      Review of Systems:     12 point ROS negative except for HPI  above.     Medications/Allergies/Immunizations:     Medication list reviewed and reconciled this visit in the EMR. Allergy list confirmed in the EMR.    Current Outpatient Medications   Medication Sig    ibuprofen (ADVIL,MOTRIN) 200 MG tablet Take 2 tablets (400 mg total) by mouth 3 times daily as needed for Pain    acetaminophen (TYLENOL) 325 MG tablet Take 2 tablets (650 mg total) by mouth every 4 hours as needed    budesonide-formoterol (SYMBICORT) 160-4.5 MCG/ACT inhaler Inhale 2 puffs into the lungs 2 times daily Shake well before each use.    albuterol HFA (PROVENTIL, VENTOLIN, PROAIR HFA) 108 (90 Base) MCG/ACT inhaler Inhale 1-2 puffs into the lungs every 6 hours as needed for Wheezing Shake well before each use.    clindamycin (CLINDAGEL) 1 % gel Apply topically 2 times daily    naproxen (NAPROSYN) 500 MG tablet Take 1 tablet (500 mg total) by mouth 2 times daily (with meals)    amLODIPine (NORVASC) 5 MG tablet Take 5 mg by mouth daily    docusate sodium (COLACE) 100 MG capsule Take 100 mg by mouth 3 times daily    ferrous gluconate (FERGON) 324 (38 Fe) MG tablet Take 324 mg by mouth    FLUoxetine (PROZAC) 20 MG capsule Take 20  mg by mouth daily    albuterol-ipratropium (COMBIVENT RESPIMAT) 20-100 MCG/ACT inhaler Inhale 1 puff into the lungs    nicotine (NICODERM CQ) 7 MG/24HR patch Place 1 patch onto the skin every 24 hours    phenazopyridine (PYRIDIUM) 100 MG tablet Take 100 mg by mouth    QUEtiapine (SEROQUEL) 200 MG tablet Take 200 mg by mouth daily    ursodiol (ACTIGALL) 300 MG capsule Take 2 capsules (600 mg total) by mouth 2 times daily (Patient not taking: Reported on 02/23/2019)    metoprolol (TOPROL-XL) 25 MG 24 hr tablet Take 1 tablet (25 mg total) by mouth daily Do not crush or chew. May be divided. (Patient not taking: Reported on 07/20/2018)    blood pressure monitor Use as directed (Patient not taking: Reported on 07/20/2018)         Past Medical History/Family History/Social  History:     Past Medical History, Family History, and Social History reviewed, confirmed, and updated as appropriate this visit in the EMR.     Physical Exam:     Vitals:    10/01/19 1428 10/01/19 1520   BP: (!) 144/92 142/86   Pulse: 76    Temp: 36.9 C (98.4 F)    TempSrc: Temporal    Weight: 129.2 kg (284 lb 14.4 oz)        BP Readings from Last 3 Encounters:   10/01/19 142/86   12/27/18 135/59   05/10/18 140/84     Wt Readings from Last 3 Encounters:   10/01/19 129.2 kg (284 lb 14.4 oz)   12/27/18 124.7 kg (275 lb)   05/10/18 128 kg (282 lb 3.2 oz)       Vitals Signs: PIR:JJOA mass index is 47.41 kg/m., otherwise as above, reviewed with patient    Physical Exam  Constitutional:       General: She is not in acute distress.     Appearance: She is well-developed.   Cardiovascular:      Rate and Rhythm: Normal rate and regular rhythm.      Heart sounds: Normal heart sounds.   Pulmonary:      Effort: Pulmonary effort is normal. No respiratory distress.      Breath sounds: Normal breath sounds. No wheezing.   Abdominal:      General: Abdomen is flat. Bowel sounds are normal. There is no distension.      Palpations: Abdomen is soft. There is no mass.      Tenderness: There is no abdominal tenderness. There is no guarding or rebound.      Hernia: No hernia is present.   Skin:     General: Skin is warm and dry.   Psychiatric:         Behavior: Behavior normal.           Assessment and Plan:     Diagnoses and all orders for this visit:    Dysuria  Will check urine today for UTI and STDs.  Encourage patient to increase hydration.  -     Urinalysis with reflex to culture; Future  -     N. Gonorrhoeae DNA amplification (Urine-voided); Future  -     Chlamydia plasmid DNA amplification (Urine-voided); Future    Bloating and diarrhea  -     AMB REFERRAL TO GASTROENTEROLOGY    Iron deficiency anemia  Check CBC today to monitor iron deficiency anemia.  Encourage patient to continue oral iron therapy but suspect she may need IV  iron  per hematology.  Encouraged her to call hematology to schedule visit.  Also encouraged her to call her GYN provider to discuss heavy menses.  -     CBC and differential; Future  -     COMPREHENSIVE METABOLIC PANEL; Future  -     TIBC; Future      Obesity, Class III, BMI 49.1 (morbid obesity)  With patient's permission refer to bariatric surgery to discuss lifestyle modification, dietary changes and consideration for bariatric surgery.  -     AMB REFERRAL TO BARIATRIC SURGERY    Work Physical   No concerning findings on physical exam to prevent patient from working.  Would like to check blood work to make sure she is not severely anemic prior to clearing her from work.  -     TB Skin Test  (AMBULATORY USE ONLY)        Handouts Given: Patient educational materials distributed by print-out and/or inserted into AVS   Patient Instructions   Please call [phone] to set up GYN follow up     Please call  [phone] to set up appointment with your hematologist           Follow-up:   RTC:  1 month  Author:   Lavonia Drafts, PA     10/01/2019 3:23 PM

## 2019-10-02 ENCOUNTER — Telehealth: Payer: Self-pay | Admitting: Internal Medicine

## 2019-10-02 LAB — CHLAMYDIA PLASMID DNA AMPLIFICATION: Chlamydia Plasmid DNA Amplification: 0

## 2019-10-02 LAB — N. GONORRHOEAE DNA AMPLIFICATION: N. gonorrhoeae DNA Amplification: 0

## 2019-10-02 NOTE — Telephone Encounter (Signed)
Erroneous contact    Message on another thread for patient wanting results 10/01/19 labs

## 2019-10-02 NOTE — Telephone Encounter (Signed)
Copied from CRM #[id number]. Topic: Access to Care - Speak to Provider/Office Staff  >> Oct 02, 2019 11:58 AM [name] wrote:  Jenna [name] is requesting a return call to review her recent test results.    What type of test was done? Labs and urinalysis    Where was the test done?  [facility]     On what day was the test done? 7/`9/21    Please return the patients call at [phone] results and next step in care.

## 2019-10-03 ENCOUNTER — Other Ambulatory Visit: Payer: Self-pay | Admitting: Internal Medicine

## 2019-10-03 DIAGNOSIS — R350 Frequency of micturition: Secondary | ICD-10-CM

## 2019-10-03 DIAGNOSIS — D509 Iron deficiency anemia, unspecified: Secondary | ICD-10-CM

## 2019-10-03 NOTE — Telephone Encounter (Signed)
Copied from CRM [id number]. Topic: Access to Care - Labs/Orders/Imaging  >> Oct 03, 2019 11:05 AM Joycelyn Schmid wrote:  Patient is calling again in regard to getting her blood work and urinalysis results back. She states that she is anxious about the results to know if she needs to be put on medication or blood transfusion or anything.    She can be reached back at  [phone]

## 2019-10-03 NOTE — Progress Notes (Signed)
Called and spoke with Morrie Sheldon.  After discussing with her PCP and CCP no need for transfusion at this time.  Her H&H has been stable since April.  However she does need urgent hematology appointment to discuss treatment options.  I placed an urgent referral today and gave Ajaya the number and encouraged her to call them tomorrow to set up an appointment.  We will also need to repeat blood work next week to monitor anemia and also slight increase in creatinine.  Wen understands and agrees to this plan.    Lavonia Drafts, [name]   10/03/2019 11:57 AM

## 2019-10-05 ENCOUNTER — Telehealth: Payer: Self-pay | Admitting: Internal Medicine

## 2019-10-05 NOTE — Telephone Encounter (Signed)
Copied from CRM [id number]. Topic: Access to Care - Speak to Provider/Office Staff  >> Oct 05, 2019  3:05 PM Toma Deiters wrote:  Patient calling in stating she starts work Monday 10/08/19 and needs her PPD read, and a paper signed for her physical. She needs the paperwork by Monday, states she can pick it up or could it be emailed to patient to the e-mail on file.    Patient can be reached at  [phone]

## 2019-10-05 NOTE — Telephone Encounter (Signed)
Writer called patient in regards to paperwork,as patient needs to bring paperwork before anything can get signed    Patient will try putting it through mychart but if it doesn't work patient will drop it off     Writer advised patient okay

## 2019-10-09 ENCOUNTER — Telehealth: Payer: Self-pay | Admitting: Internal Medicine

## 2019-10-09 ENCOUNTER — Ambulatory Visit: Payer: Medicaid (Managed Care)

## 2019-10-09 NOTE — Progress Notes (Deleted)
[facility] Progress Note    Reason for Visit     No chief complaint on file.      Subjective/HPI     Jenna [name] is a 34 year old female with a history of morbid obesity, biliary colic s/p cholecystectomy, depression, migraines, and iron deficiency anemia who presents due to malodorous vaginal discharge.    Patient was seen on 10/01/19 for dysuria and urinary frequency. UA was unremarkable, and Chlamydia and Gonorrhea testing were negative.    Past Medical History: has Obesity, Class III, BMI 49.1 (morbid obesity); Hx Cocaine abuse; Chronic hypertension in pregnancy; Asthma; Depression; Family history of SIDS (sudden infant death syndrome); Migraines; Supervision of high-risk pregnancy; History of cesarean delivery, T incision with 4th c/s; Dichorionic diamniotic twin gestation; Pica; Twin pregnancy; S/P repeat C-section + BTL; and Biliary colic on their problem list.   Medications:   Current Outpatient Medications   Medication Sig    ibuprofen (ADVIL,MOTRIN) 200 MG tablet Take 2 tablets (400 mg total) by mouth 3 times daily as needed for Pain    acetaminophen (TYLENOL) 325 MG tablet Take 2 tablets (650 mg total) by mouth every 4 hours as needed    budesonide-formoterol (SYMBICORT) 160-4.5 MCG/ACT inhaler Inhale 2 puffs into the lungs 2 times daily Shake well before each use.    albuterol HFA (PROVENTIL, VENTOLIN, PROAIR HFA) 108 (90 Base) MCG/ACT inhaler Inhale 1-2 puffs into the lungs every 6 hours as needed for Wheezing Shake well before each use.    clindamycin (CLINDAGEL) 1 % gel Apply topically 2 times daily    naproxen (NAPROSYN) 500 MG tablet Take 1 tablet (500 mg total) by mouth 2 times daily (with meals)    amLODIPine (NORVASC) 5 MG tablet Take 5 mg by mouth daily    docusate sodium (COLACE) 100 MG capsule Take 100 mg by mouth 3 times daily    ferrous gluconate (FERGON) 324 (38 Fe) MG tablet Take 324 mg by mouth    FLUoxetine (PROZAC) 20 MG capsule Take 20 mg  by mouth daily    albuterol-ipratropium (COMBIVENT RESPIMAT) 20-100 MCG/ACT inhaler Inhale 1 puff into the lungs    nicotine (NICODERM CQ) 7 MG/24HR patch Place 1 patch onto the skin every 24 hours    phenazopyridine (PYRIDIUM) 100 MG tablet Take 100 mg by mouth    QUEtiapine (SEROQUEL) 200 MG tablet Take 200 mg by mouth daily    ursodiol (ACTIGALL) 300 MG capsule Take 2 capsules (600 mg total) by mouth 2 times daily (Patient not taking: Reported on 02/23/2019)    metoprolol (TOPROL-XL) 25 MG 24 hr tablet Take 1 tablet (25 mg total) by mouth daily Do not crush or chew. May be divided. (Patient not taking: Reported on 07/20/2018)    blood pressure monitor Use as directed (Patient not taking: Reported on 07/20/2018)     No current facility-administered medications for this visit.      Allergies: No Known Allergies (drug, envir, food or latex)    Past medical, surgical, and social histories were reviewed and updated.  Medications reviewed and reconciled.     Physical Exam     There were no vitals filed for this visit.  Wt Readings from Last 3 Encounters:   10/01/19 129.2 kg (284 lb 14.4 oz)   12/27/18 124.7 kg (275 lb)   05/10/18 128 kg (282 lb 3.2 oz)     BP Readings from Last 3 Encounters:   10/01/19 142/86   12/27/18 135/59  05/10/18 140/84     Lab Results   Component Value Date    HA1C 5.2 07/22/2017    HA1C 5.1 10/18/2016    CREAT 1.10 (H) 10/01/2019    LDLC 35 10/18/2016       Physical exam:   ***      Assessment and Plan     Assessment:   # ***    HCM:   - Vaccines - ***  - Screening - ***    Plan:  There are no diagnoses linked to this encounter.        Follow up in ***    Case discussed with Dr. Logan Bores.    Marti Sleigh MD  Internal Medicine PGY1 [phone]  10/09/19 2:29 PM

## 2019-10-09 NOTE — Telephone Encounter (Signed)
Spoke with pt    Pt was seen 10/01/19 for kidney and abdominal pain and vaginal discharge. Pt did not note odor at that time  Pt had a UA and STD testing which she states was negative   Pt reports now she has noticed an odor and is wondering if there is "something else going on"    Pt able to be scheduled in [facility] today for follow up, pt verbalized understanding.

## 2019-10-09 NOTE — Telephone Encounter (Signed)
Copied from CRM [id number]. Topic: Access to Care - Speak to Provider/Office Staff  >> Oct 09, 2019  8:32 AM Percell Miller wrote:  Patient reports an odor in her vaginal area, vaginal discharge, and has abdominal pain.  She would like to be seen today.    Patient is coming to [facility] today to get her PPD read today and get a copy of her physical.  She wants to know if she can be seen then.    Patient contact number is [phone]

## 2019-10-10 ENCOUNTER — Telehealth: Payer: Self-pay

## 2019-10-10 NOTE — Telephone Encounter (Signed)
Writer reached out to patient who utilized [facility] within the past year. Writer is contacting patient to complete phone survey to evaluate their experience at the [facility].     Writer attempted to contact patient without success. A voicemail was left asking the patient to call the office if they were interested in completing the survey.    Eymi Lipuma  Dietetic Intern

## 2019-10-10 NOTE — Telephone Encounter (Signed)
Writer called patient in regards to paperwork patient has to drop off for provider to fill out    Patient stated she tried to send through Edna and its not working     Patient asked if she can send through an Chief Operating Officer provided patient work email as patient has been struggling and not able to drop off paperwork     Patent stated she will send

## 2019-10-11 ENCOUNTER — Telehealth: Payer: Self-pay | Admitting: Internal Medicine

## 2019-10-11 NOTE — Telephone Encounter (Signed)
Writer called patient and call not going through in regards to paperwork patient was suppose to email to writer and have not received anything on 7/28/21or 10/11/19     Writer will go on vacation 10/12/19 and return 8/3    Please see encounter from 10/05/19 as this has been an on going issue    Patient has tried to send through mychart not working,patient has tried to email PSS Chrissy and Clinical research associate hasn't received anything     Patient has to bring in the paperwork as Clinical research associate has tried everything for patient     When patient calls the office back please advise she has to bring in paperwork
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
## 2019-11-09 ENCOUNTER — Encounter: Payer: Self-pay | Admitting: Internal Medicine

## 2019-11-16 ENCOUNTER — Telehealth: Payer: Self-pay | Admitting: Internal Medicine

## 2019-11-16 NOTE — Telephone Encounter (Signed)
Called and spoke to Jenna [name] regarding need for repeat lab work.  She states she is aware that she needs to have labs checked to monitor her anemia but has not had a chance to get the lab yet.  She will plan to go to the lab today or tomorrow to have blood work drawn.  She has continued to have heavy periods and is taking her oral iron supplement daily.  She notes some fatigue which is stable for her.  She denies any lightheadedness, shortness of breath, chest pains or dizziness.  She knows to present to the ED if the symptoms occur.  She does plan to make her follow-up visit with Dr. Earlean Polka on September 9 and is aware of hematology/oncology appointment on 9/15.    Lavonia Drafts, [name]   11/16/2019 11:00 AM

## 2019-11-22 ENCOUNTER — Ambulatory Visit: Payer: Medicaid (Managed Care) | Admitting: Student in an Organized Health Care Education/Training Program

## 2019-11-26 ENCOUNTER — Telehealth: Payer: Self-pay | Admitting: Internal Medicine

## 2019-11-26 NOTE — Telephone Encounter (Signed)
Writer called patient to schedule an appointment.    Appt was scheduled for 11/29/19    On the call patient requested Medicaid transportation to this appt (Thursday 11/29/19)    Routing to [place] team

## 2019-11-26 NOTE — Telephone Encounter (Signed)
Called and spoke with Jenna [name], she is feeling about the same. Some fatigue but no SOB or chest pains. Missed appointment with our office last week. Plans to attend hematology appointment on Wednesday and will get blood work done prior to that visit to monitor her anemia.   If she develops new or worsening symptoms she will contact the office or present to the ED.  Will ask team to reschedule her SIM visit.[email or web address]  Jenna [name], [name]   11/26/2019 2:20 PM

## 2019-11-26 NOTE — Telephone Encounter (Signed)
Appointment is scheduled.

## 2019-11-27 NOTE — Telephone Encounter (Signed)
Writer called patient and advised that transportation has been set up for appt on 11/29/19

## 2019-11-27 NOTE — Telephone Encounter (Signed)
Trip Confirmation  Jenna [name] is scheduled for a Primary Care Office appointment on 11/29/2019 at 3:00pm.  At 2:00pm Jenna Around [facility] will pick up Jenna [name] at [address], [address] and take them to [address] [place], [place]  At 4:30pm Jenna [facility] will pick up [name] at [address] [place] and take them to [address], [address]  Jenna [name] may contact Jenna [name] at [phone] for changes to pick-up and return times prior to this trip.  Jenna [name]'s Trip  Date: 11/29/2019 Time: 3:00pm Inv# [phone]

## 2019-11-28 ENCOUNTER — Ambulatory Visit: Payer: Medicaid (Managed Care)

## 2019-11-28 NOTE — Patient Instructions (Signed)
Your care team is:  Dr. Frank Akwaa  Wendy McCarty, NP  Leiland Mihelich, RN  Benita Shelley, secretary     Dr. Akwaa also works with rotating fellows. Our current fellows are:  Dr. Nancy Torres  Dr. Craig Maguire    The office phone number is: [phone]    This phone will be answered by a secretary who will take your message Monday - Friday, 8:00 am - 5:00 pm.   Please be prepared to let the secretary know why you are calling.  This will help let your nurse know how urgent the call is and how to direct it. Someone from the office will then call you back. We do not have direct phone lines to nursing staff.   Please call this number to schedule, cancel or reschedule any appointments in the office and infusion center.     This is also the same number that you should call after hours and on weekends/holidays for any urgent needs.  There is always a fellow on call and a weekend triage RN.    We do communicate through a secure online patient portal; MyChart. You may use this system to review results, medical history, office notes and appointments. You may also request prescription medications electronically. We ask that any urgent messages or concerns be communicated via telephone and not MyChart.    If you need to have blood work done, please have this done at a [facility] lab. The orders will be placed electronically. You do not need a paper requisition.   Please visit [email or web address] for a list of our lab locations and hours.    Please note that at this time we are still following a strict, no visitor policy in our infusion center and radiation. Please make arrangements accordingly and communicate any concerns related to this policy to your team.     On Tuesdays our office is in [address]- 2nd floor [facility]    On Wednesday mornings, our office is on the 5th floor of the ambulatory tower. Take the silver elevators off of the main hospital lobby to the 5th  floor.    On Wednesday afternoons, our office is in [address] A- 1st floor [facility]

## 2019-11-29 ENCOUNTER — Ambulatory Visit: Payer: Medicaid (Managed Care) | Admitting: Student in an Organized Health Care Education/Training Program

## 2019-12-03 ENCOUNTER — Telehealth: Payer: Self-pay | Admitting: Internal Medicine

## 2019-12-03 DIAGNOSIS — J4550 Severe persistent asthma, uncomplicated: Secondary | ICD-10-CM

## 2019-12-03 MED ORDER — BUDESONIDE-FORMOTEROL FUMARATE 160-4.5 MCG/ACT IN AERO *I*
2.0000 | INHALATION_SPRAY | Freq: Two times a day (BID) | RESPIRATORY_TRACT | 5 refills | Status: DC
Start: 2019-12-03 — End: 2019-12-07

## 2019-12-03 MED ORDER — AMLODIPINE BESYLATE 5 MG PO TABS *I*
5.0000 mg | ORAL_TABLET | Freq: Every day | ORAL | 1 refills | Status: DC
Start: 2019-12-03 — End: 2019-12-07

## 2019-12-03 MED ORDER — ALBUTEROL SULFATE HFA 108 (90 BASE) MCG/ACT IN AERS *I*
1.0000 | INHALATION_SPRAY | Freq: Four times a day (QID) | RESPIRATORY_TRACT | 3 refills | Status: DC | PRN
Start: 2019-12-03 — End: 2019-12-07

## 2019-12-03 NOTE — Telephone Encounter (Signed)
Please see other encounter from today with transportation set up

## 2019-12-03 NOTE — Telephone Encounter (Signed)
Received message as grouper that pt requests refills of amlodipine, symbicort, and albuterol. Refills sent.    Vinnie Level, MD  Internal Medicine PGY-3  [facility] [phone]

## 2019-12-03 NOTE — Telephone Encounter (Signed)
Pt states she has had nausea and abdominal cramping for last week  Pt notes since she had her gallbladder out her baseline diarrhea has been worse   Denies any vomiting, states she has been able to eat and drink    Pt also states her right leg keeps "popping out of my hip". States she has fallen a few times because of this. She limps when she walks. This has been going on "for awhile"    Pt states shortness of breath occurs when walking up stairs. She states this causes her to sweat "profusely" but resolves with rest. SOB with exertion has been occurring for the last month or two.     Scheduled for FUV with K. Glenetta Hew Wednesday morning, note to PSS to arrange transportation, pt verbalized understanding.    Pt requesting refills of Amlodipine, Albuterol, Symbicort, note to PSS to assist with refills.

## 2019-12-03 NOTE — Telephone Encounter (Signed)
Copied from CRM [id number]. Topic: Appointments - Schedule Appointment  >> Dec 03, 2019  9:45 AM Juanda Bond wrote:  Ms. Donegan calling to report that she is experiencing stomach pain that's interfering with bowels, having pain when going to the bathroom, been feeling nauseous and having cramping.   Patient also complaining about her right leg stating leg is dislocating from her hip.  Patient also states having trouble breathing going up stairs it feels like her heart is going to jump out of her chess.  These symptoms have been going on for patient states all symptoms have been 2-3 monthes.    Patient requesting same day appointment? yes  Patient requesting call back? yes    Phone number confirmed at [phone].

## 2019-12-03 NOTE — Telephone Encounter (Signed)
Trip Confirmation  [name] is scheduled for a Primary Care Office appointment on 12/05/2019 at 8:45am.  At 7:45am All [facility] will pick up [name] at [address] and take them to [address] [place], [place]  At 10:15am [facility] [place] will pick up [name] at [address], [place] and take them to [address], [address]  [name] may contact [facility] [place] at [phone] for changes to pick-up and return times prior to this trip.  Billi Schwanz's Trip  Date: 12/05/2019 Time: 8:45am Inv# [phone]      Trip Confirmation  [name] is scheduled for a Primary Care Office appointment on 01/16/2020 at 1:30pm.  At 12:30pm All Around [facility] will pick up [name] at [address] and take them to [address] [name], [place]  At 3:00pm All Around [place] will pick up [name] at [address] [place] and take them to [address], [address]  [name] may contact [facility] [place] at [phone] for changes to pick-up and return times prior to this trip.  [name]'s Trip  Date: 01/16/2020 Time: 1:30pm Inv# [phone]

## 2019-12-03 NOTE — Telephone Encounter (Signed)
Medication request routed to Dr logrono for processing 12/03/2019 1:49 PM

## 2019-12-03 NOTE — Telephone Encounter (Signed)
Writer called patient to advise transportation has been set  Up for 12/05/19 and 01/16/20 as written below in detail of trips    Patient stated thank you

## 2019-12-03 NOTE — Telephone Encounter (Signed)
Lindajo Royal, RN  Lavonia Drafts, [name]; [facility] Team 3 hours ago (10:43 AM)   Devoria Albe Chrissy, could you please arrange transportation for pt's [facility] appointment Wednesday, and refill her Amlodipine, Albuterol, and Symbicort? Thank you!   Vira Agar for Wednesday

## 2019-12-05 ENCOUNTER — Ambulatory Visit: Payer: Medicaid (Managed Care) | Admitting: Internal Medicine

## 2019-12-05 NOTE — Telephone Encounter (Signed)
Called pt due to missed [facility] appointment   Pt states no cab came to pick her up for her appointment today    Pt states she is "not doing good today". Tearful on phone call    Pt reports pain in her chest, has been sweating profusely. Reports difficulty breathing. These symptoms are worse with exertion. Rates chest pain is 8/10 with movement.  States she feels a bit better when she is sitting down  Pt reports these symptoms have been going on for "months" but are getting worse    Denies arm, jaw, neck pain    Advised pt go to ED for evaluation. Pt states she will go tonight. Pt does not have a ride. Writer offered to call pt an ambulance, she declined and states she will call her own ambulance.

## 2019-12-06 ENCOUNTER — Ambulatory Visit: Payer: Medicaid (Managed Care) | Admitting: Internal Medicine

## 2019-12-06 NOTE — Telephone Encounter (Signed)
Call to patient, no answer.   Voicemail message left requesting call back.   Call back number provided.      Call to listed contact Durwin Nora, who is not currently with pt  Durwin Nora provided phone number for pt - [phone]    Call to new number, phone rings, is answered, and then call disconnects x3

## 2019-12-06 NOTE — Telephone Encounter (Signed)
Call to patient, no answer.   Detailed voicemail message left requesting call back to confirm today's [facility] appointment.  Call back number provided.

## 2019-12-07 ENCOUNTER — Ambulatory Visit
Payer: Medicaid (Managed Care) | Attending: Internal Medicine | Admitting: Student in an Organized Health Care Education/Training Program

## 2019-12-07 ENCOUNTER — Other Ambulatory Visit
Admission: RE | Admit: 2019-12-07 | Discharge: 2019-12-07 | Disposition: A | Discharge status: Home / Self Care | Payer: Medicaid (Managed Care) | Source: Ambulatory Visit

## 2019-12-07 ENCOUNTER — Encounter: Payer: Self-pay | Admitting: Student in an Organized Health Care Education/Training Program

## 2019-12-07 VITALS — BP 160/73 | HR 75 | Temp 97.0°F | Ht 65.0 in | Wt 294.0 lb

## 2019-12-07 DIAGNOSIS — Z113 Encounter for screening for infections with a predominantly sexual mode of transmission: Secondary | ICD-10-CM

## 2019-12-07 DIAGNOSIS — D649 Anemia, unspecified: Secondary | ICD-10-CM

## 2019-12-07 DIAGNOSIS — D509 Iron deficiency anemia, unspecified: Secondary | ICD-10-CM | POA: Insufficient documentation

## 2019-12-07 DIAGNOSIS — R350 Frequency of micturition: Secondary | ICD-10-CM | POA: Insufficient documentation

## 2019-12-07 DIAGNOSIS — I10 Essential (primary) hypertension: Secondary | ICD-10-CM | POA: Insufficient documentation

## 2019-12-07 DIAGNOSIS — N898 Other specified noninflammatory disorders of vagina: Secondary | ICD-10-CM | POA: Insufficient documentation

## 2019-12-07 DIAGNOSIS — R5383 Other fatigue: Secondary | ICD-10-CM | POA: Insufficient documentation

## 2019-12-07 DIAGNOSIS — J4550 Severe persistent asthma, uncomplicated: Secondary | ICD-10-CM | POA: Insufficient documentation

## 2019-12-07 DIAGNOSIS — N920 Excessive and frequent menstruation with regular cycle: Secondary | ICD-10-CM | POA: Insufficient documentation

## 2019-12-07 DIAGNOSIS — M25551 Pain in right hip: Secondary | ICD-10-CM | POA: Insufficient documentation

## 2019-12-07 LAB — MULTIPLE ORDERING DOCS

## 2019-12-07 LAB — CBC AND DIFFERENTIAL
Baso # K/uL: 0.1 10*3/uL (ref 0.0–0.1)
Basophil %: 0.7 %
Eos # K/uL: 0.2 10*3/uL (ref 0.0–0.4)
Eosinophil %: 1.7 %
Hematocrit: 26 % — ABNORMAL LOW (ref 34–45)
Hemoglobin: 6.7 g/dL — ABNORMAL LOW (ref 11.2–15.7)
IMM Granulocytes #: 0 10*3/uL (ref 0.0–0.0)
IMM Granulocytes: 0.4 %
Lymph # K/uL: 2.5 10*3/uL (ref 1.2–3.7)
Lymphocyte %: 24.3 %
MCH: 19 pg — ABNORMAL LOW (ref 26–32)
MCHC: 26 g/dL — ABNORMAL LOW (ref 32–36)
MCV: 74 fL — ABNORMAL LOW (ref 79–95)
Mono # K/uL: 0.7 10*3/uL (ref 0.2–0.9)
Monocyte %: 7.1 %
Neut # K/uL: 6.8 10*3/uL — ABNORMAL HIGH (ref 1.6–6.1)
Nucl RBC # K/uL: 0 10*3/uL (ref 0.0–0.0)
Nucl RBC %: 0 /100 WBC (ref 0.0–0.2)
Platelets: 403 10*3/uL — ABNORMAL HIGH (ref 160–370)
RBC: 3.5 MIL/uL — ABNORMAL LOW (ref 3.9–5.2)
RDW: 18 % — ABNORMAL HIGH (ref 11.7–14.4)
Seg Neut %: 65.8 %
WBC: 10.3 10*3/uL — ABNORMAL HIGH (ref 4.0–10.0)

## 2019-12-07 LAB — BASIC METABOLIC PANEL
Anion Gap: 12 (ref 7–16)
CO2: 25 mmol/L (ref 20–28)
Calcium: 10.2 mg/dL (ref 8.8–10.2)
Chloride: 103 mmol/L (ref 96–108)
Creatinine: 0.87 mg/dL (ref 0.51–0.95)
GFR,Black: 100 *
GFR,Caucasian: 87 *
Glucose: 99 mg/dL (ref 60–99)
Lab: 10 mg/dL (ref 6–20)
Potassium: 4.9 mmol/L (ref 3.3–5.1)
Sodium: 140 mmol/L (ref 133–145)

## 2019-12-07 LAB — LIPID PANEL
Chol/HDL Ratio: 3.3
Cholesterol: 160 mg/dL
HDL: 48 mg/dL (ref 40–60)
LDL Calculated: 72 mg/dL
Non HDL Cholesterol: 112 mg/dL
Triglycerides: 202 mg/dL — AB

## 2019-12-07 LAB — T4, FREE: Free T4: 1.6 ng/dL (ref 0.9–1.7)

## 2019-12-07 LAB — TSH: TSH: 1.21 u[IU]/mL (ref 0.27–4.20)

## 2019-12-07 MED ORDER — ALBUTEROL SULFATE HFA 108 (90 BASE) MCG/ACT IN AERS *I*
1.0000 | INHALATION_SPRAY | Freq: Four times a day (QID) | RESPIRATORY_TRACT | 3 refills | Status: DC | PRN
Start: 2019-12-07 — End: 2020-03-20

## 2019-12-07 MED ORDER — AMLODIPINE BESYLATE 5 MG PO TABS *I*
5.0000 mg | ORAL_TABLET | Freq: Every day | ORAL | 1 refills | Status: DC
Start: 2019-12-07 — End: 2020-03-20

## 2019-12-07 MED ORDER — FERROUS GLUCONATE 324 MG PO TABS
324.0000 mg | ORAL_TABLET | ORAL | 0 refills | Status: AC
Start: 2019-12-07 — End: 2020-03-06

## 2019-12-07 MED ORDER — BUDESONIDE-FORMOTEROL FUMARATE 160-4.5 MCG/ACT IN AERO *I*
2.0000 | INHALATION_SPRAY | Freq: Two times a day (BID) | RESPIRATORY_TRACT | 5 refills | Status: DC
Start: 2019-12-07 — End: 2020-03-20

## 2019-12-07 NOTE — Progress Notes (Signed)
HPI: Jenna [name] presents today in follow up.    HTN- BP above goal in office (160s systolic). Pt reports not taking her BP medication. Weight has increased since last visit. Not eating healthy diet.  R hip pain- reports her right hip has been "popping out" but goes back into place. Compares this to dislocating her fingers. Does not radiate. Walking okay  Fatigue, dyspnea- Pt reports dyspnea with exertion, she does okay on flat ground but gets out of breath after only a few steps. Reports noncompliance with her inhalers, wheezing. Denies chest pain or palpitations.  Menorrhagia- reports ongoing 7 days of heavy menstrual bleeding whenever she gets her period. S/p tubal ligation. Taking iron pills sporadically. Not following with gyn.    Current medications, allergies, and history reviewed and updated.   Pt reports "not taking any of my medications for a while" except for Iron tabs occasionally. Out of all her inhalers.   Refilled amlodipine, albuterol, Symbicort, and iron    EXAM:  Vitals -   Vitals:    12/07/19 1523   BP: 160/73   Pulse: 75   Temp: 36.1 C (97 F)   Weight: 133.4 kg (294 lb)   Height: 1.651 m (5\' 5" )      Morbidly obese, well appearance, cheerful mood. Accompanied by her daughter  Pale conjunctiva  Normal pulse, RRR, no murmur  Abdomen obese soft NTND  Gait is antalgic for first few steps but improves with ambulation. Right hip tender in area of the bursa. No leg length discrepancy, normal hip ROM.     IMPRESSION: 34 y.o. female with Hx menorrhagia, severe iron deficiency anemia, asthma, morbid obesity here in follow up. Several acute concerns, mostly surrounding fatigue and decreased exertional capacity which is likely related to her anemia. Will check her blood counts today, may need admission for transfusion although she is      PLAN:  # exertional intolerance, daytime fatigue- eval H/H, check TSH. At risk for OSA (did not discuss today)  # menorrhagia, iron deficiency anemia - she missed  establish care visit with Hematology for iron infusions, was given info for rescheduling today. Referred to Gyn due to her severe blood loss during menses  # HTN - refilled amlodipine  # asthma- Symbicort. Albuterol PRN  # R hip - start workup with plain films, may have bursitis vs groin strain    Follow up as scheduled with CCP in few weeks      20, MD  Internal Medicine Chief Resident  PIC# [phone]  12/07/19 4:17 PM

## 2019-12-07 NOTE — Patient Instructions (Addendum)
Please call and reschedule your Hematology appointment. This is very important for your blood counts since the iron pills have not been helping.  Dr. Gretta Arab, NP  Jose Persia, RN  Boyd Kerbs, secretary     The office phone number is: [phone]    This phone will be answered by a secretary who will take your message Monday - Friday, 8:00 am - 5:00 pm.      Restart your medications:  AMLODIPINE for blood pressure  SYMBICORT twice daily for your breathing  IRON every other day    You were referred to a Gynecologist for help with your heavy bleeding

## 2019-12-07 NOTE — Progress Notes (Deleted)
[facility] Progress Note    Subjective:       Sueanne Maniaci is a 34 y.o. year old female with PMH significant for migraine, anemia, cocaine abuse, tobacco abuse, HTN, morbid obesity, presenting today for ***. He was last seen on *** by *** at which time ***.    We have been trying to get her in for a few days due to reported stomach pain and chest pain but she has had transportation issues. She also refused ED evaluation. She has significant iron deficiency anemia and would likely benefit from repeat CBC today.           Medications:     Current Outpatient Medications on File Prior to Visit   Medication Sig Dispense Refill    amLODIPine (NORVASC) 5 MG tablet Take 1 tablet (5 mg total) by mouth daily 90 tablet 1    albuterol HFA (PROVENTIL, VENTOLIN, PROAIR HFA) 108 (90 Base) MCG/ACT inhaler Inhale 1-2 puffs into the lungs every 6 hours as needed for Wheezing  Shake well before each use. 1 each 3    budesonide-formoterol (SYMBICORT) 160-4.5 MCG/ACT inhaler Inhale 2 puffs into the lungs 2 times daily  Shake well before each use. 10.2 each 5    ibuprofen (ADVIL,MOTRIN) 200 MG tablet Take 2 tablets (400 mg total) by mouth 3 times daily as needed for Pain 90 tablet 1    acetaminophen (TYLENOL) 325 MG tablet Take 2 tablets (650 mg total) by mouth every 4 hours as needed 60 tablet 6    clindamycin (CLINDAGEL) 1 % gel Apply topically 2 times daily 30 g 0    naproxen (NAPROSYN) 500 MG tablet Take 1 tablet (500 mg total) by mouth 2 times daily (with meals) 60 tablet 0    docusate sodium (COLACE) 100 MG capsule Take 100 mg by mouth 3 times daily      ferrous gluconate (FERGON) 324 (38 Fe) MG tablet Take 324 mg by mouth      FLUoxetine (PROZAC) 20 MG capsule Take 20 mg by mouth daily      albuterol-ipratropium (COMBIVENT RESPIMAT) 20-100 MCG/ACT inhaler Inhale 1 puff into the lungs      nicotine (NICODERM CQ) 7 MG/24HR patch Place 1 patch onto the skin every 24 hours      phenazopyridine (PYRIDIUM) 100  MG tablet Take 100 mg by mouth      QUEtiapine (SEROQUEL) 200 MG tablet Take 200 mg by mouth daily      ursodiol (ACTIGALL) 300 MG capsule Take 2 capsules (600 mg total) by mouth 2 times daily (Patient not taking: Reported on 02/23/2019) 120 capsule 5    metoprolol (TOPROL-XL) 25 MG 24 hr tablet Take 1 tablet (25 mg total) by mouth daily Do not crush or chew. May be divided. (Patient not taking: Reported on 07/20/2018) 30 tablet 5    blood pressure monitor Use as directed (Patient not taking: Reported on 07/20/2018) 1 each 0     No current facility-administered medications on file prior to visit.       Medications reviewed and reconciled.     Physical Exam:     There were no vitals filed for this visit.  Wt Readings from Last 3 Encounters:   10/01/19 129.2 kg (284 lb 14.4 oz)   12/27/18 124.7 kg (275 lb)   05/10/18 128 kg (282 lb 3.2 oz)     BP Readings from Last 3 Encounters:   10/01/19 142/86   12/27/18 135/59   05/10/18  140/84       General: *** In no apparent distress.  HEENT: PERRL.  Neck:  No lymphadenopathy  Pulmonary: CTA B/L. No wheezes, rhonchi, or rales.  Cardiovascular: RRR. No murmurs, rubs, or gallops.   Abdominal: Soft. Non-distended. Non-tender. Bowel sounds present.  Extremities: No peripheral edema.  Neuro: Alert and oriented to conversation. Face symmetric. Moving extremities freely  Skin: No lesions      Assessment and Plan:     Tiana Sivertson is a 34 y.o. year old female with PMH significant for *** presenting today for ***.     Follow up in *** for ***.     Lorayne Bender, MD  Internal Medicine PGY1  12/07/2019 12:55 PM

## 2019-12-08 LAB — SYPHILIS SCREEN
Syphilis Screen: NEGATIVE
Syphilis Status: NONREACTIVE

## 2019-12-08 LAB — CHLAMYDIA PLASMID DNA AMPLIFICATION: Chlamydia Plasmid DNA Amplification: 0

## 2019-12-08 LAB — N. GONORRHOEAE DNA AMPLIFICATION: N. gonorrhoeae DNA Amplification: 0

## 2019-12-08 LAB — HIV 1&2 ANTIGEN/ANTIBODY: HIV 1&2 ANTIGEN/ANTIBODY: NONREACTIVE

## 2019-12-09 LAB — VAGINITIS SCREEN: DNA PROBE: Vaginitis Screen:DNA Probe: POSITIVE — AB

## 2019-12-09 LAB — TRICHOMONAS DNA AMPLIFICATION

## 2019-12-10 ENCOUNTER — Telehealth: Payer: Self-pay | Admitting: Internal Medicine

## 2019-12-10 DIAGNOSIS — D509 Iron deficiency anemia, unspecified: Secondary | ICD-10-CM

## 2019-12-10 MED ORDER — METRONIDAZOLE 500 MG PO TABS *I*
500.0000 mg | ORAL_TABLET | Freq: Two times a day (BID) | ORAL | 0 refills | Status: AC
Start: 2019-12-10 — End: 2019-12-17

## 2019-12-10 NOTE — Telephone Encounter (Signed)
Pt with +BV. Will Rx MTZ tablets due to not tolerating vaginal admin during her menses.    Labs overall reassuring but stable severe iron def anemia. Has not needed admission for  transfusion for this H/H in the past year but still in need of urgent Heme follow up.    Attempted to reach multiple telephones listed without success.    Bluford Kaufmann, MD  Internal Medicine Chief Resident  PIC# [phone]  12/10/19 9:33 AM

## 2019-12-10 NOTE — Telephone Encounter (Addendum)
Spoke with pt   Relayed BV lab result and antibiotic instructions, pt verbalized understanding.  Pt inquired about diabetes labs, writer will reach out to provider to advise    Writer to reach out to provider regarding renewing hematology consult, pt verbalized understanding.           Bluford Kaufmann, MD  [facility] Team 1 hour ago (9:38 AM)   RR  Attempted to notify Jenna [name] of +BV and prescribed metronidazole 500mg  BID for 7 days. Unable to reach. Can team attempt to let her know? Labs with persistent anemia, but no worse than priors. Needs to schedule follow up with Heme. If symptom severity worsens or persistent heavy bleeding should go to the ED.

## 2019-12-10 NOTE — Addendum Note (Signed)
Addended by: Bluford Kaufmann on: 12/10/2019 12:51 PM     Modules accepted: Orders

## 2019-12-10 NOTE — Telephone Encounter (Signed)
Spoke with pt and relayed the following message from Dr. Thad Ranger. Pt verbalized understanding. Provided pt with hematology scheduling phone number: [phone]  Pt verbalized understanding.         Bluford Kaufmann, MD  [facility] 2 minutes ago (12:50 PM)   RR  Thank you! Her random glucose was good, no diabetes. The return to sender may have been due to her no-show. I can send a new referral, but would still suggest to her she calls to see if this can be rescheduled.    Message text

## 2019-12-11 LAB — HEMOGLOBIN A1C: Hemoglobin A1C: 5.2 %

## 2020-01-08 ENCOUNTER — Telehealth: Payer: Self-pay

## 2020-01-08 NOTE — Telephone Encounter (Signed)
CM attempted to reach patient however, both phone numbers were not in service. CM will continue to outreach.

## 2020-01-09 ENCOUNTER — Telehealth: Payer: Self-pay

## 2020-01-09 NOTE — Telephone Encounter (Signed)
CM attempted to call patients emergency contact however this phone number was not in service. CM sent a mychart message to patient.

## 2020-01-16 ENCOUNTER — Ambulatory Visit: Payer: Medicaid (Managed Care) | Admitting: Student in an Organized Health Care Education/Training Program

## 2020-01-22 ENCOUNTER — Telehealth: Payer: Self-pay | Admitting: Internal Medicine

## 2020-01-22 ENCOUNTER — Other Ambulatory Visit
Admission: RE | Admit: 2020-01-22 | Discharge: 2020-01-22 | Disposition: A | Discharge status: Home / Self Care | Payer: Medicaid (Managed Care) | Source: Ambulatory Visit

## 2020-01-22 NOTE — Telephone Encounter (Signed)
Writer called patient     Trip Confirmation  Naviah Belfield is scheduled for a Primary Care Office appointment on 01/23/2020 at 1:30pm.  At 12:30pm All [facility] will pick up [name] at [address], [address] and take them to [address] [place], [place]  At 3:00pm All Around [place] will pick up [name] at [address] [place] and take them to [address], [address]  Ludella Pranger may contact [facility] [place] at [phone] for changes to pick-up and return times prior to this trip.  Mykenna Bruntz's Trip  Date: 01/23/2020 Time: 1:30pm Inv# [phone]

## 2020-01-22 NOTE — Telephone Encounter (Signed)
Copied from CRM #[id number]. Topic: Access to Care - Speak to Provider/Office Staff  >> Jan 22, 2020  9:46 AM [name] wrote:  Ms. Hirth calling to report that she is experiencing bleed even after menstrual, stomach cramps, and problems staying awake.   These symptoms have been going on for 5 month(s)    Patient requesting same day appointment? yes  Patient requesting call back? yes    Phone number confirmed at [phone]

## 2020-01-22 NOTE — Telephone Encounter (Signed)
Spoke with patient.     Patient states she has been having stomach cramps after she eats, anytime she eats, no matter what or how much she eats.   States she began having loose stool ("like a smoothie") about a month ago     Patient reports she has been having her menses with additional bleeding in between her periods. Endorses extreme fatigue, no energy.     Patient also states she is sweating more with exertion, has had some increase in shortness of breath with exertion, and has had intermittent cough for 2 weeks.       Appointment made with her CCP Dr. Earlean Polka for tomorrow at 1:40pm.    Needs medicab for appointment.

## 2020-01-23 ENCOUNTER — Ambulatory Visit: Payer: Medicaid (Managed Care) | Admitting: Student in an Organized Health Care Education/Training Program

## 2020-01-23 ENCOUNTER — Telehealth: Payer: Self-pay

## 2020-01-23 NOTE — Telephone Encounter (Signed)
CM called patient to set up an enrollment meeting. CM and patient will meet today before patients appointment.

## 2020-01-23 NOTE — Telephone Encounter (Signed)
CM called patient because patient no showed for appointment today. CM asked if patient would like to reschedule. Patient stated they were no longer interested in the program. CM instructed patient to reach out to PCP if she changes her mind in the future.

## 2020-01-29 ENCOUNTER — Emergency Department
Admission: EM | Admit: 2020-01-29 | Discharge: 2020-01-30 | Discharge status: Against Medical Advice | Payer: Medicaid (Managed Care) | Source: Ambulatory Visit
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
## 2020-02-20 ENCOUNTER — Telehealth: Payer: Self-pay | Admitting: Internal Medicine

## 2020-02-20 NOTE — Telephone Encounter (Signed)
Writer called patient to get scheduled for a PPD     Patient stated no she needs a physical     Writer scheduled patient with Dr.Usman as CCP is not available     Patient stated she will bring in her paperwork for the provider to fill out     Patient is scheduled with [facility] tomorrow 02/21/20    Writer advised patient does she ned this faxed     Patient stated yes     Writer advised will place Medical Release Form in providers folder     Patient stated okay thank you     Routing to [facility] as an FYI as medical release form has been placed in her folder

## 2020-02-20 NOTE — Telephone Encounter (Signed)
Copied from CRM [id number]. Topic: Appointments - Schedule Appointment  >> Feb 20, 2020 10:32 AM [name] wrote:  Jenna [name] is calling to request a physical and PPD as soon as possible.    Patient says this is for a job.    Please call Jessamine to schedule at [phone]

## 2020-02-21 ENCOUNTER — Ambulatory Visit: Payer: Medicaid (Managed Care) | Admitting: Student in an Organized Health Care Education/Training Program

## 2020-02-21 ENCOUNTER — Telehealth: Payer: Self-pay | Admitting: Internal Medicine

## 2020-02-21 NOTE — Telephone Encounter (Signed)
Writer rescheduled patient for a different day as [facility] doesn't allow transportation a few minutes before appt     Patient has been rescheduled       Trip Confirmation  Jenna [name] is scheduled for a Primary Care Office appointment on 02/25/2020 at 9:00am.  At 8:00am [facility] will pick up Jenna [name] at [address] and take them to [address] [place], [place]  At 10:30am [facility] [place] will pick up Jenna [name] at [address], [place] and take them to [address], [address]  Jenna [name] may contact [facility] [place] at [phone] for changes to pick-up and return times prior to this trip.  Jenna [name]'s Trip  Date: 02/25/2020 Time: 9:00am Inv# [id number]

## 2020-02-21 NOTE — Telephone Encounter (Signed)
Copied from CRM [id number]. Topic: Access to Care - Refer to Specialty or Service  >> Feb 21, 2020 12:00 PM Jenna [name] wrote:  Patient reporting she is having issues with her stomach not feeling well and is requesting transportation to her appointment today at [facility]    Patient is requesting transportation to their upcoming appointment on 02/21/20 at 1:50 pm  located at [facility].    Is the patient experiencing any symptoms? yes  If yes, the symptoms are: stomach issues not feeling well requesting change of her appt today to an urgent visit. [name] up patient at (verified) home address? yes    Special accommodations needed? no    Preferred transportation company?any available     Patient contact number [phone]    Patient was advised might not be able to schedule the transportation for the 1:50 patient disconnected call states had another call coming in from another office and did request a return call.

## 2020-02-25 ENCOUNTER — Ambulatory Visit: Payer: Medicaid (Managed Care) | Admitting: Internal Medicine

## 2020-02-25 ENCOUNTER — Encounter: Payer: Self-pay | Admitting: Internal Medicine

## 2020-02-25 ENCOUNTER — Telehealth: Payer: Self-pay | Admitting: Internal Medicine

## 2020-02-25 DIAGNOSIS — Z5321 Procedure and treatment not carried out due to patient leaving prior to being seen by health care provider: Secondary | ICD-10-CM

## 2020-02-25 NOTE — Telephone Encounter (Signed)
Copied from CRM [id number]. Topic: Information Request - Transportation Information  >> Feb 25, 2020 11:30 AM [name] wrote:  Patient is requesting transportation to their upcoming appointment on 02/27/2020 at 3:00 pm located at [facility].    Patient had an appt 12/13 and she is not coming back for it at 12 pm.    Please keep the paperwork that she left behind.    Is the patient experiencing any symptoms? No, physical for work  If yes, the symptoms are: .    Pick up patient at (verified) home address? yes  If no, patient's pick up address is     Special accommodations needed? No  plus 4 kids    Preferred transportation company?[facility]    Patient contact number [phone] (home)

## 2020-02-25 NOTE — Telephone Encounter (Signed)
Copied from CRM [id number]. Topic: Appointments - Appointment Information  >> Feb 25, 2020  8:30 AM Zella Richer wrote:  Jenna [name] is called stated Transportation said they're going to be late picking her up due to the wrong number being provided for them to reach out to her (see previous message provided w/patient's number is incorrect).       The patient stated they're going to still bring her due to it was the office error, and the patient is asking for confirmation she can still come in due to needing the already scheduled appointment for a work-physical.      Please return her call at [phone] [facility])  to discuss.     FYI- Writer shared detail w/Brissa on the [facility] Urgent line

## 2020-02-25 NOTE — Telephone Encounter (Signed)
Writer sent mychart message to patient     Trip Confirmation  Jenna [name] is scheduled for a Primary Care Office appointment on 02/27/2020 at 3:00pm.  At 2:00pm [facility] will pick up Jenna [name] at [address], [address] and take them to [address] [place], [place]  At 4:30pm [facility] [place] will pick up Jenna [name] at [address] [place] and take them to [address], [address]  Jenna [name] may contact [facility] [place] at [phone] for changes to pick-up and return times prior to this trip.  Jenna [name]'s Trip  Date: 02/27/2020 Time: 3:00pm Inv# [id number]

## 2020-02-25 NOTE — Progress Notes (Signed)
This encounter was created in error - please disregard.

## 2020-02-25 NOTE — Telephone Encounter (Signed)
Writer spoke with patient here at [facility] and advised when Clinical research associate called [facility] this morning they ha 6 numbers on file for patient     Writer advised writer had them delete all numbers and updated patient's number and Clinical research associate apologized for any confusion as Clinical research associate was confused this morning as well     Patient stated thank you and it's okay

## 2020-02-27 ENCOUNTER — Ambulatory Visit: Payer: Medicaid (Managed Care) | Admitting: Internal Medicine

## 2020-02-27 ENCOUNTER — Telehealth: Payer: Self-pay | Admitting: Internal Medicine

## 2020-02-27 NOTE — Telephone Encounter (Signed)
Writer called patient to advise of trip    Writer has advised patient in the past multiple times she has to call herself to transportation to remove her restriction     Writer was advised by transportation that patient has to call herself they will not Pharmacist, hospital speak for patient     Trip Confirmation  Jenna [name] is scheduled for a Primary Care Office appointment on 03/03/2020 at 1:00pm.  At 12:00pm [facility] will pick up Jenna [name] at [address], [address] and take them to [address] [place], [place]  At 2:30pm [facility] [place] will pick up Jenna [name] at [address] [place] and take them to [address], [address]  Jenna [name] may contact [facility] [place] at [phone] for changes to pick-up and return times prior to this trip.  Jenna [name]'s Trip  Date: 03/03/2020 Time: 1:00pm Inv# [id number]

## 2020-02-27 NOTE — Telephone Encounter (Signed)
Patient is requesting transportation to their upcoming appointment on 03/03/20 at 1:00 pm  located at [facility].    Is the patient experiencing any symptoms? no    Pick up patient at (verified) home address? yes     Special accommodations needed? no    Preferred transportation company?  medical cab     Patient contact number [phone]    Patient reporting she is unable to schedule on own - not sure if medicaid restricted - states the office has to schedule the transportation for her

## 2020-02-27 NOTE — Telephone Encounter (Signed)
Copied from CRM [id number]. Topic: Appointments - Reschedule Appointment  >> Feb 27, 2020  3:03 PM [name] wrote:  Ms. Summons is calling to cancel her appointment which is currently scheduled for 02/26/20.    Reason for the cancellation: family issues today    Has the appointment been cancelled? yes    Has the appointment been rescheduled? yes    Date of new appointment? 03/03/20    Does the patient need a call back to reschedule?no    Patient can be contacted back at [phone]   .

## 2020-03-03 ENCOUNTER — Ambulatory Visit: Payer: Medicaid (Managed Care) | Admitting: Internal Medicine

## 2020-03-04 NOTE — Telephone Encounter (Signed)
Noted transportation and appointment rescheduled for 03/03/20.

## 2020-03-19 ENCOUNTER — Telehealth: Payer: Self-pay | Admitting: Internal Medicine

## 2020-03-19 NOTE — Telephone Encounter (Addendum)
Writer called patient to advise she has to bring in her paperwork as she is not active with mychart     Patient has been scheduled with Dr.Rusnak for tomorrow     Writer will place ROI for patient to sign in proivder's folder     Patient stated okay and thank you     Routing to Dr.Rusnak as an FYI     Trip Confirmation  Jenna [name] is scheduled for a Primary Care Office appointment on 03/20/2020 at 4:15pm.  At 3:15pm [facility] will pick up Jenna [name] at [address], [address] and take them to [address] [place], [place]  At 5:45pm [facility] [place] will pick up [name] at [address] [place] and take them to [address], [address]  Jenna [name] may contact [facility] [place] at [phone] for changes to pick-up and return times prior to this trip.  Jenna [name]'s Trip  Date: 03/20/2020 Time: 4:15pm Inv# [phone]

## 2020-03-19 NOTE — Telephone Encounter (Signed)
Copied from CRM [id number]. Topic: Appointments - Schedule Appointment  >> Mar 19, 2020  2:18 PM Joycelyn Schmid wrote:  Patient called and requested to reschedule work physical she missed. She states her employer called and wanted her to complete ASAP.   She inquired if it could be scheduled before writer's first available (Wed next week).     She can be reached back at [phone]

## 2020-03-20 ENCOUNTER — Ambulatory Visit: Payer: Medicaid (Managed Care) | Admitting: Student in an Organized Health Care Education/Training Program

## 2020-03-20 ENCOUNTER — Encounter: Payer: Self-pay | Admitting: Gastroenterology

## 2020-03-20 ENCOUNTER — Encounter: Payer: Self-pay | Admitting: Student in an Organized Health Care Education/Training Program

## 2020-03-20 VITALS — BP 158/84 | HR 102 | Temp 97.2°F | Wt 300.6 lb

## 2020-03-20 DIAGNOSIS — R5383 Other fatigue: Secondary | ICD-10-CM

## 2020-03-20 DIAGNOSIS — I10 Essential (primary) hypertension: Secondary | ICD-10-CM

## 2020-03-20 DIAGNOSIS — Z0000 Encounter for general adult medical examination without abnormal findings: Secondary | ICD-10-CM

## 2020-03-20 DIAGNOSIS — N920 Excessive and frequent menstruation with regular cycle: Secondary | ICD-10-CM

## 2020-03-20 DIAGNOSIS — J4550 Severe persistent asthma, uncomplicated: Secondary | ICD-10-CM

## 2020-03-20 MED ORDER — ALBUTEROL SULFATE HFA 108 (90 BASE) MCG/ACT IN AERS *I*
1.0000 | INHALATION_SPRAY | Freq: Four times a day (QID) | RESPIRATORY_TRACT | 3 refills | Status: DC | PRN
Start: 2020-03-20 — End: 2020-11-13

## 2020-03-20 MED ORDER — AMLODIPINE BESYLATE 5 MG PO TABS *I*
5.0000 mg | ORAL_TABLET | Freq: Every day | ORAL | 1 refills | Status: DC
Start: 2020-03-20 — End: 2020-11-13

## 2020-03-20 MED ORDER — FERROUS SULFATE 325 (65 FE) MG PO TABS *WRAPPED* *I*
325.0000 mg | ORAL_TABLET | Freq: Every day | ORAL | 5 refills | Status: AC
Start: 2020-03-20 — End: 2020-09-16

## 2020-03-20 MED ORDER — ACETAMINOPHEN 325 MG PO TABS *I*
650.0000 mg | ORAL_TABLET | ORAL | 6 refills | Status: DC | PRN
Start: 2020-03-20 — End: 2020-06-18

## 2020-03-20 MED ORDER — BUDESONIDE-FORMOTEROL FUMARATE 160-4.5 MCG/ACT IN AERO *I*
2.0000 | INHALATION_SPRAY | Freq: Two times a day (BID) | RESPIRATORY_TRACT | 5 refills | Status: DC
Start: 2020-03-20 — End: 2020-11-13

## 2020-03-20 NOTE — Patient Instructions (Signed)
-   please get bloodwork done to check your thyroid and blood levels       - our team will call you about scheduling appointement    - do not take any advil, motrin, nsaids, aspirin, alleve, etc.
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
## 2020-03-24 ENCOUNTER — Telehealth: Payer: Self-pay

## 2020-03-24 NOTE — Telephone Encounter (Signed)
Jenna [name]  [id number]  [address]  [place] [address]     REFERRAL FROM:   [facility]: Dr. Earlean Polka    PRESENTING PROBLEM:                 Rental assistance, RGE, childcare    ADDITIONAL INFORMATION:  Pt tells writer she is struggling financially and facing eviction. Pt reports she lives with her fiance and 4 children. Pt reports she is without income and they are living off of her fiance's pension and the SSI that one of her children receives. Pt states both she and her fiance have been interviewing for jobs and are hopeful at their employment prospects. Pt states her CPS worker found childcare for her 35 year old twins but not for her 1 year old daughter.  Pt states the job she is interviewing for is from home so she should be able to accept it even without childcare for her 35 year old. Pt tells writer she has been in touch with her Section 8 worker but her worker is not able to find any rental assistance for her. Pt reports they have enough food at home as they receive SNAP. Pt wondering if writer is aware of any community resources for rental assistance or RGE.        ACTION TAKEN:  Per pt's request writer to email pt a list of agencies that might be able to assist her with rent and will provide pt with information on HEAP. Pt requesting Clinical research associate send email to: [email or web address].    PLAN:  SW to remain available prn.    Livia Snellen, LMSW  [facility]  Phone: [phone]      Livia Snellen, LMSW  [facility]  Phone: [phone]
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
## 2020-03-25 ENCOUNTER — Ambulatory Visit: Payer: Medicaid (Managed Care) | Admitting: Obstetrics and Gynecology

## 2020-03-25 ENCOUNTER — Telehealth: Payer: Self-pay
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
## 2020-03-26 ENCOUNTER — Other Ambulatory Visit
Admission: RE | Admit: 2020-03-26 | Discharge: 2020-03-26 | Disposition: A | Discharge status: Home / Self Care | Payer: Medicaid (Managed Care) | Source: Ambulatory Visit | Attending: Internal Medicine | Admitting: Internal Medicine

## 2020-03-26 ENCOUNTER — Telehealth: Payer: Self-pay | Admitting: Internal Medicine

## 2020-03-26 DIAGNOSIS — R5383 Other fatigue: Secondary | ICD-10-CM | POA: Insufficient documentation

## 2020-03-26 DIAGNOSIS — N920 Excessive and frequent menstruation with regular cycle: Secondary | ICD-10-CM | POA: Insufficient documentation

## 2020-03-26 NOTE — Telephone Encounter (Signed)
Copied from CRM [id number]. Topic: Appointments - Schedule Appointment  >> Mar 26, 2020 11:01 AM Joycelyn Schmid wrote:  Patient is requesting transportation to their upcoming appointment on 1/14 at 1 PM located at [facility] for PPD Placement.     She states the PPD placement is urgent to complete, so she'd like to know if the office can schedule transportation for the appointment Friday.    Is the patient experiencing any symptoms? no    Pick up patient at (verified) home address? yes    Special accommodations needed? Extra rider     Preferred transportation company?[facility] [employer]    Patient contact number [phone]

## 2020-03-26 NOTE — Telephone Encounter (Signed)
Trip Confirmation  Jenna [name] is scheduled for a Primary Care Office appointment on 03/28/2020 at 1:00pm.  At 12:00pm [facility] will pick up Jenna [name] at [address], [address] and take them to [address] [place], [place]  At 2:30pm [facility] [place] will pick up Jenna [name] at [address], [place] and take them to [address], [address]  Jenna [name] may contact [facility] [place] at [phone] for changes to pick-up and return times prior to this trip.  Jenna [name]'s Trip  Date: 03/28/2020 Time: 1:00pm Inv# [phone]

## 2020-03-26 NOTE — Progress Notes (Signed)
Social Work Note -   Pt called office requesting assistance with transportation to tomorrow's appt.  The following arrangements were made;  *On 03/27/2020 at 9:15am [facility] will pick up [place] at [address], [place] and take her to [address], [place]  *At 11:45am [facility] [address] will pick up [place] at [employer], [place] and take her to [address], [address]  *Kieran may contact [facility] [place] at [phone] for changes to pick-up and return times prior to this trip. Inv# [id number]    Pt updated on arrangements above, informed that for future appts she needs to call [facility] at [phone] to arrange own cabs to appts.    Elissa Hefty, LMSW [phone], [phone]

## 2020-03-27 ENCOUNTER — Other Ambulatory Visit: Payer: Self-pay | Admitting: Student in an Organized Health Care Education/Training Program

## 2020-03-27 ENCOUNTER — Ambulatory Visit: Payer: Medicaid (Managed Care) | Admitting: Obstetrics and Gynecology

## 2020-03-27 DIAGNOSIS — Z111 Encounter for screening for respiratory tuberculosis: Secondary | ICD-10-CM

## 2020-03-27 NOTE — Progress Notes (Signed)
Placed order for PPD to be placed tomorrow during visit at 1pm.

## 2020-03-28 ENCOUNTER — Telehealth: Payer: Self-pay | Admitting: Internal Medicine

## 2020-03-28 ENCOUNTER — Ambulatory Visit: Payer: Medicaid (Managed Care)

## 2020-03-28 NOTE — Telephone Encounter (Signed)
Call to patient. Spoke to Jenna [name].  Explained to patient that we need to cancel her appointment for her PPD placement today because the clinic is closed on Monday.    Patient instructed to stop in anytime to get a PPD placed; she does not need an appointment. Patient educated to come in when she is able to return in 480-72 hours to have it read.    Patient stated the paperwork she was given does not have dates om it and she needs to bring it in to have it completed.  Writer told her to bring it in when she comes for her PPD placement.

## 2020-04-01 ENCOUNTER — Telehealth: Payer: Self-pay

## 2020-04-01 ENCOUNTER — Telehealth: Payer: Self-pay | Admitting: Internal Medicine

## 2020-04-01 NOTE — Telephone Encounter (Signed)
Call to patient to relay message from Dr. Earlean Polka about patient's blood work.    Patient states she has not had blood work done in months, so the results must be on another patient.    Writer ensured patient clinic would follow up on it.      Patient asked for confirmation of her next appointment. Writer confirmed she has an appointment tomorrow 1/19 at 2:40pm with Samara Deist, [name].    Transportation will be arranged.        From: [name], MD  Sent: 03/30/2020   7:50 AM EST  To: [facility] Team    Hi team,    Please let Jaleya know that we got the results of her bloodwork, and her bloodcounts look much improved! They are actually in the normal range, and her iron stores look much better. I am very happy! Also, her kidney and thyroid functions are normal. No electrolyte abnormalities.    Thanks,  Maralyn Sago

## 2020-04-01 NOTE — Telephone Encounter (Signed)
-----   Message from [facility] sent at 04/01/2020  7:30 AM EST -----    ----- Message -----  From: [name], MD  Sent: 03/30/2020   7:50 AM EST  To: Thom Chimes Team    Hi team,    Please let Eliane know that we got the results of her bloodwork, and her bloodcounts look much improved! They are actually in the normal range, and her iron stores look much better. I am very happy! Also, her kidney and thyroid functions are normal. No electrolyte abnormalities.    Thanks,  Maralyn Sago

## 2020-04-01 NOTE — Telephone Encounter (Signed)
Trip Confirmation  Marquisha Nikolov is scheduled for a Primary Care Office appointment on 04/02/2020 at 2:45pm.  At 1:45pm [facility] will pick up [name] at [address], [address] and take them to [address] [place], [place]  At 4:15pm [facility] will pick up [name] at [address], [place] and take them to [address], [address]  Jaclyn Andy may contact [facility] at [phone] for changes to pick-up and return times prior to this trip.  Janalyn Kuchenbecker's Trip  Date: 04/02/2020 Time: 2:45pm Inv# [id number]

## 2020-04-02 ENCOUNTER — Telehealth: Payer: Self-pay

## 2020-04-02 ENCOUNTER — Encounter: Payer: Self-pay | Admitting: Internal Medicine

## 2020-04-02 ENCOUNTER — Ambulatory Visit: Payer: Medicaid (Managed Care) | Attending: Internal Medicine | Admitting: Internal Medicine

## 2020-04-02 VITALS — BP 137/81 | HR 71 | Temp 97.0°F | Wt 298.9 lb

## 2020-04-02 DIAGNOSIS — F32A Depression, unspecified: Secondary | ICD-10-CM

## 2020-04-02 DIAGNOSIS — D649 Anemia, unspecified: Secondary | ICD-10-CM | POA: Insufficient documentation

## 2020-04-02 LAB — CBC AND DIFFERENTIAL
Baso # K/uL: 0 10*3/uL (ref 0.0–0.1)
Basophil %: 0.5 %
Eos # K/uL: 0.1 10*3/uL (ref 0.0–0.4)
Eosinophil %: 1.1 %
Hematocrit: 26 % — ABNORMAL LOW (ref 34–45)
Hemoglobin: 7 g/dL — ABNORMAL LOW (ref 11.2–15.7)
IMM Granulocytes #: 0 10*3/uL (ref 0.0–0.0)
IMM Granulocytes: 0.5 %
Lymph # K/uL: 2.7 10*3/uL (ref 1.2–3.7)
Lymphocyte %: 32.6 %
MCH: 20 pg — ABNORMAL LOW (ref 26–32)
MCHC: 27 g/dL — ABNORMAL LOW (ref 32–36)
MCV: 74 fL — ABNORMAL LOW (ref 79–95)
Mono # K/uL: 0.6 10*3/uL (ref 0.2–0.9)
Monocyte %: 7.8 %
Neut # K/uL: 4.7 10*3/uL (ref 1.6–6.1)
Nucl RBC # K/uL: 0 10*3/uL (ref 0.0–0.0)
Nucl RBC %: 0 /100 WBC (ref 0.0–0.2)
Platelets: 484 10*3/uL — ABNORMAL HIGH (ref 160–370)
RBC: 3.6 MIL/uL — ABNORMAL LOW (ref 3.9–5.2)
RDW: 19.3 % — ABNORMAL HIGH (ref 11.7–14.4)
Seg Neut %: 57.5 %
WBC: 8.2 10*3/uL (ref 4.0–10.0)

## 2020-04-02 LAB — COMPREHENSIVE METABOLIC PANEL
ALT: 23 U/L (ref 0–35)
AST: 23 U/L (ref 0–35)
Albumin: 4.5 g/dL (ref 3.5–5.2)
Alk Phos: 95 U/L (ref 35–105)
Anion Gap: 13 (ref 7–16)
Bilirubin,Total: <0.2 mg/dL (ref 0.0–1.2)
CO2: 23 mmol/L (ref 20–28)
Calcium: 9.2 mg/dL (ref 8.8–10.2)
Chloride: 104 mmol/L (ref 96–108)
Creatinine: 0.97 mg/dL — ABNORMAL HIGH (ref 0.51–0.95)
GFR,Black: 88 *
GFR,Caucasian: 76 *
Glucose: 95 mg/dL (ref 60–99)
Lab: 13 mg/dL (ref 6–20)
Potassium: 4.7 mmol/L (ref 3.3–5.1)
Sodium: 140 mmol/L (ref 133–145)
Total Protein: 7.6 g/dL (ref 6.3–7.7)

## 2020-04-02 LAB — TIBC
Iron: 13 ug/dL — ABNORMAL LOW (ref 34–165)
TIBC: 466 ug/dL — ABNORMAL HIGH (ref 250–450)
Transferrin Saturation: 3 % — ABNORMAL LOW (ref 15–50)

## 2020-04-02 LAB — TSH: TSH: 1.8 u[IU]/mL (ref 0.27–4.20)

## 2020-04-02 LAB — FERRITIN: Ferritin: 7 ng/mL — ABNORMAL LOW (ref 10–120)

## 2020-04-02 NOTE — Patient Instructions (Signed)
Please go to the lab on the first floor to have blood work done today

## 2020-04-02 NOTE — Progress Notes (Signed)
[facility] Follow-up Note     Reason For Visit:   Follow Up     HPI:      Jenna [name] is 35 y.o. year old female coming in to discuss the following issues:  Visit very abbreviated today due to patient not being in room until 3 and patient     1. Anemia- taking iron supplement intermittently. Continue to have heavy vaginal bleeding with periods and some in between periods. Has not yet followed up with GYN. Agreeable to Iron infusion but previously missed multiple hematology appointments. Feels short of breath at times and low energy. No chest pains, no SOB at rest, no syncope.     Jenna [name] confirms that she did not have blood work done this week yet, so the blood work results form 03/26/20 are not actually hers. We will need to work to remove these values from her chart.    2. [facility] not [facility].     3. Cold a few days ago. Took home covid [name] which was negative. Entire family tested negative. Symptoms significantly improved today. Jenna [name].   Review of Systems:     12 point ROS negative except for HPI above.     Medications/Allergies/Immunizations:     Medication list reviewed and reconciled this visit in the EMR. Allergy list confirmed in the EMR.    Current Outpatient Medications   Medication Sig    acetaminophen (TYLENOL) 325 mg tablet Take 2 tablets (650 mg total) by mouth every 4 hours as needed    albuterol HFA (PROVENTIL, VENTOLIN, PROAIR HFA) 108 (90 Base) MCG/ACT inhaler Inhale 1-2 puffs into the lungs every 6 hours as needed for Wheezing  Shake well before each use.    amLODIPine (NORVASC) 5 mg tablet Take 1 tablet (5 mg total) by mouth daily    ferrous sulfate 325 (65 FE) mg tablet Take 1 tablet (325 mg total) by mouth daily (with breakfast)    budesonide-formoterol (SYMBICORT) 160-4.5 MCG/ACT inhaler Inhale 2 puffs into the lungs 2 times daily  Shake well before each use.    docusate sodium (COLACE) 100 MG  capsule Take 100 mg by mouth 3 times daily    FLUoxetine (PROZAC) 20 MG capsule Take 20 mg by mouth daily    nicotine (NICODERM CQ) 7 MG/24HR patch Place 1 patch onto the skin every 24 hours    phenazopyridine (PYRIDIUM) 100 MG tablet Take 100 mg by mouth (Patient not taking: Reported on 03/20/2020)    QUEtiapine (SEROQUEL) 200 MG tablet Take 200 mg by mouth daily (Patient not taking: Reported on 03/20/2020)    blood pressure monitor Use as directed (Patient not taking: Reported on 07/20/2018)         Past Medical History/Family History/Social History:     Past Medical History, Family History, and Social History reviewed, confirmed, and updated as appropriate this visit in the EMR.     Physical Exam:     Vitals:    04/02/20 1454   BP: 137/81   Pulse: 71   Temp: 36.1 C (97 F)   TempSrc: Temporal   Weight: 135.6 kg (298 lb 14.4 oz)       BP Readings from Last 3 Encounters:   04/02/20 137/81   03/20/20 158/84   12/07/19 160/73     Wt Readings from Last 3 Encounters:   04/02/20 135.6 kg (298 lb 14.4 oz)   03/20/20 136.4  kg (300 lb 9.6 oz)   12/07/19 133.4 kg (294 lb)       Vitals Signs: JJO:ACZY mass index is 49.74 kg/m., otherwise as above, reviewed with patient    Physical Exam  Constitutional:       General: She is not in acute distress.     Appearance: She is well-developed. She is not ill-appearing.   Cardiovascular:      Rate and Rhythm: Normal rate and regular rhythm.      Heart sounds: Normal heart sounds.   Pulmonary:      Effort: Pulmonary effort is normal. No respiratory distress.      Breath sounds: Normal breath sounds. No wheezing.   Skin:     General: Skin is warm and dry.     Physical exam limited due to time constraints.      Assessment and Plan:     Diagnoses and all orders for this visit:    Anemia, unspecified type  Patient has symptoms of SOB and low energy. Will check labs today. Of not 03/26/20 labs are not this patient's labs.   Discussed signs and symptoms of severe anemia that should prompt  Jenna [name] to present tot he ED.  Will attempt to reschedule her with GYN and hematology with assistance of new health home care manager.   -     CBC and differential; Future  -     Ferritin; Future  -     COMPREHENSIVE METABOLIC PANEL; Future  -     TIBC; Future  -     TSH; Future    Depression, unspecified depression type  Jenna [name] would like to follow up with [facility] for mental health care.     Due to very short visit return in 2 weeks for follow up.     Handouts Given: Patient educational materials distributed by print-out and/or inserted into AVS   Patient Instructions   Please go to the lab on the first floor to have blood work done today     I personally spent 35 minutes this calendar day on care of this patient, including chart review, pre-visit work, face-to-face time with the patient, documentation and post visit work.    Follow-up:   RTC:  2 weeks  Author:   Lavonia Drafts, PA     04/03/2020 9:18 AM

## 2020-04-02 NOTE — Progress Notes (Signed)
PPD placed left FA.  Patient tolerated well.  Patient will return Friday 2/21 at 5pm for read.  Patient has paperwork.

## 2020-04-03 ENCOUNTER — Telehealth: Payer: Self-pay | Admitting: Internal Medicine

## 2020-04-03 NOTE — Telephone Encounter (Signed)
Spoke with pt, relayed message from Lance Coon PA  Pt is asking if Jenna [name] thinks she has cancer because of her anemia  Land can send a message to Jenna [name] to reach out to pt to clarify any further questions about this message, pt stated "I'll just wait to see that cancer specialist" (hematology) and ended the call.      Lavonia Drafts, [name]  [facility] 1 hour ago (2:13 PM)   KM  Labs show continued significant anemia but stable since September. She should continue her iron pill every other day. Corrine her [facility] is going to assist in scheduling her with hematology and GYN.It is very important that she gets to these appointments!     Thank you,   [employer] text

## 2020-04-03 NOTE — Telephone Encounter (Signed)
Copied from CRM #[id number]. Topic: Access to Care - Labs/Orders/Imaging  >> Apr 03, 2020  1:27 PM [name] wrote:  Ms. Sauseda is requesting a return call to review her recent test results.    What type of test was done? Blood work     Where was the test done? [facility] office    On what day was the test done? 04/02/2020    Please return the patients call at [phone]

## 2020-04-04 ENCOUNTER — Telehealth: Payer: Self-pay | Admitting: Internal Medicine

## 2020-04-04 LAB — TB SKIN TEST: Induration:TB skin test: 0 mm
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
## 2020-04-04 NOTE — Telephone Encounter (Signed)
CM called pt to discuss [facility] program. Pt mentioned that she had her period and was bleeding heavily. CM offered to meet pt at her appt the following day; pt agreed. CM notified PA of plan.    [facility] Manager  [facility]  O: [phone]  C: [phone]

## 2020-04-07 ENCOUNTER — Other Ambulatory Visit: Payer: Self-pay

## 2020-04-07 NOTE — Telephone Encounter (Signed)
Patient presented for PPD read.  PPD placed 04/02/20 in left forearm.  PPD read 55mm.    Negative PPD.

## 2020-04-08 ENCOUNTER — Telehealth: Payer: Self-pay | Admitting: Internal Medicine

## 2020-04-08 DIAGNOSIS — D649 Anemia, unspecified: Secondary | ICD-10-CM

## 2020-04-08 DIAGNOSIS — Z0000 Encounter for general adult medical examination without abnormal findings: Secondary | ICD-10-CM

## 2020-04-08 LAB — CBC AND DIFFERENTIAL

## 2020-04-08 LAB — BASIC METABOLIC PANEL

## 2020-04-08 LAB — TIBC

## 2020-04-08 LAB — FERRITIN

## 2020-04-08 LAB — TSH

## 2020-04-08 NOTE — Telephone Encounter (Signed)
You  Beverely Risen, [place] Just now (12:16 PM)     CM    Thank you =)    Message text      Beverely Risen, PA  You 3 minutes ago (12:12 PM)     KM    Lab work ordered. She can go to any [facility] lab for this.     Thank you,   [facility] text      You  Beverely Risen, [place] 24 minutes ago (11:51 AM)     CM    Jacklynn Ganong   I spoke with Joellen Jersey to check if patient had gotten rubela vaccine but she stated her labs were only checked for one of the 3 components of MMR Joellen Jersey stated patient will need additional lab work to check other 2 components   Can you please order the labwork for this     [employer] text

## 2020-04-08 NOTE — Telephone Encounter (Signed)
Copied from CRM [id number]. Topic: Access to Care - Speak to Provider/Office Staff  >> Apr 08, 2020 10:51 AM Jenna [name] wrote:  Jenna [name] says her work needs the date that she got the rubeola vaccine.    Patient does not know if she has had the shot or not.    If not they she will need to schedule as soon as possible.    [place] to Telephone Information:  Mobile          [phone]

## 2020-04-08 NOTE — Telephone Encounter (Signed)
Writer called patient,patient's family member picked up and stated will have patient call the office back     Access Center when patient calls the office back please tell patient Labs have been order for rubella as she can go to any [facility] labs for this

## 2020-04-10 ENCOUNTER — Telehealth: Payer: Self-pay | Admitting: Internal Medicine

## 2020-04-10 DIAGNOSIS — B9689 Other specified bacterial agents as the cause of diseases classified elsewhere: Secondary | ICD-10-CM

## 2020-04-10 DIAGNOSIS — N760 Acute vaginitis: Secondary | ICD-10-CM

## 2020-04-10 NOTE — Telephone Encounter (Signed)
Copied from CRM #[id number]. Topic: Medications/Prescriptions - Medication Question/Problem  >> Apr 10, 2020  2:09 PM Percell Miller wrote:  Patient states she was supposed to be prescribed medication for BV on 1/19 but its not at the pharmacy.    Please send to [facility] [address] [place], [place] - [address]    Patient contact number is [phone]

## 2020-04-11 LAB — TRANSFERRIN

## 2020-04-11 MED ORDER — METRONIDAZOLE 500 MG PO TABS *I*
500.0000 mg | ORAL_TABLET | Freq: Two times a day (BID) | ORAL | 0 refills | Status: AC
Start: 2020-04-11 — End: 2020-04-18

## 2020-04-11 NOTE — Telephone Encounter (Signed)
I called and spoke with Jenna [name]. She reports that she was diagnosed with BV back in October but lost her metronidazole after only taking for a few days and her symptoms came back. She is having fishy vaginal odor and some thin grey vaginal discharge.   Discussed options and she would like to go ahead with treatment rather than waiting until 1/31 visit for testing.  History of tubal ligation, so very low concern for pregnancy.  [name] agrees to follow up if symptoms do not resolve with treatment so that further testing could be done to rule out any STDs.  We reviewed that she cannot drink any alcohol while on this medication because it could result in serious illness. She states understanding and agreement.    Lavonia Drafts, [name]   04/11/2020 11:17 AM

## 2020-04-11 NOTE — Progress Notes (Signed)
CM called hematology to request an appt for the pt to address anemia concerns. Pt will need to personally call to request an appt. She does not need a new referral for services.    CM called pt and relayed this information; pt voiced understanding.     [facility] Manager  [facility]  O: [phone]  C: [phone]

## 2020-04-14 ENCOUNTER — Ambulatory Visit: Payer: Medicaid (Managed Care) | Admitting: Student in an Organized Health Care Education/Training Program

## 2020-05-01 ENCOUNTER — Other Ambulatory Visit: Payer: Self-pay | Admitting: Hematology & Oncology

## 2020-05-01 ENCOUNTER — Telehealth: Payer: Self-pay | Admitting: Internal Medicine

## 2020-05-01 DIAGNOSIS — D500 Iron deficiency anemia secondary to blood loss (chronic): Secondary | ICD-10-CM

## 2020-05-01 NOTE — Telephone Encounter (Signed)
Call to patient  Spoke with Morrie Sheldon    Patient reports bleeding from rectum    Had her Gall bladder taken out a few moths ago, has had diarrhea since.  Experiences diarrhea immediately after eating, no matter what it is    Has had some episodes of vomiting    Yesterday, patient noted stool was like mucus and foamy yesterday    This morning, patient noticed maroon colored blood in stool. Stated there was no actual stool, just mucus and blood. The mucus was maroon and yellow in color.    Reports her rectum is very sore. Unable to determine if she has hemorrhoids.    Also endorses a dry tongue and frequent urination    States she thinks she drinks enough but may be dehydrated due to diarrhea.    Has appointment with Oncologist tomorrow AM for iron deficiency anemia. Dr. Jessica Priest has already ordered labs for her.    Writer advised patient to report her diarrhea and bloody stool to her oncologist. Patient states she did not Arrie Senate, [name] about her diarrhea when she was in for a visit in January.    Instructed patient to call back if oncologist would like her to follow up with PCP.  Patient verbalized understanding.

## 2020-05-01 NOTE — Telephone Encounter (Signed)
Copied from CRM #[id number]. Topic: Access to Care - Speak to Provider/Office Staff  >> May 01, 2020 12:48 PM Stevan Born wrote:  The patient is requesting to speak with Nurse. Patient stated that this is regarding blood in stool.    Please call the patient back at [phone] to discuss.
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
## 2020-05-02 ENCOUNTER — Ambulatory Visit
Payer: Medicaid (Managed Care) | Attending: Rehabilitative and Restorative Service Providers" | Admitting: Rehabilitative and Restorative Service Providers"

## 2020-05-02 ENCOUNTER — Other Ambulatory Visit
Admission: RE | Admit: 2020-05-02 | Discharge: 2020-05-02 | Disposition: A | Discharge status: Home / Self Care | Payer: Medicaid (Managed Care) | Source: Ambulatory Visit

## 2020-05-02 ENCOUNTER — Telehealth: Payer: Self-pay | Admitting: Rehabilitative and Restorative Service Providers"

## 2020-05-02 ENCOUNTER — Ambulatory Visit: Payer: Medicaid (Managed Care) | Admitting: Hematology & Oncology

## 2020-05-02 ENCOUNTER — Encounter: Payer: Self-pay | Admitting: Rehabilitative and Restorative Service Providers"

## 2020-05-02 VITALS — BP 156/86 | HR 89 | Temp 98.6°F | Resp 22 | Ht 63.11 in | Wt 306.7 lb

## 2020-05-02 DIAGNOSIS — D500 Iron deficiency anemia secondary to blood loss (chronic): Secondary | ICD-10-CM | POA: Insufficient documentation

## 2020-05-02 DIAGNOSIS — D509 Iron deficiency anemia, unspecified: Secondary | ICD-10-CM | POA: Insufficient documentation

## 2020-05-02 LAB — CBC AND DIFFERENTIAL
Baso # K/uL: 0.1 10*3/uL (ref 0.0–0.1)
Basophil %: 0.5 %
Eos # K/uL: 0.2 10*3/uL (ref 0.0–0.4)
Eosinophil %: 1.6 %
Hematocrit: 24 % — ABNORMAL LOW (ref 34–45)
Hemoglobin: 6.2 g/dL — ABNORMAL LOW (ref 11.2–15.7)
IMM Granulocytes #: 0.1 10*3/uL — ABNORMAL HIGH (ref 0.0–0.0)
IMM Granulocytes: 0.4 %
Lymph # K/uL: 2.6 10*3/uL (ref 1.2–3.7)
Lymphocyte %: 22.8 %
MCH: 20 pg — ABNORMAL LOW (ref 26–32)
MCHC: 26 g/dL — ABNORMAL LOW (ref 32–36)
MCV: 77 fL — ABNORMAL LOW (ref 79–95)
Mono # K/uL: 1 10*3/uL — ABNORMAL HIGH (ref 0.2–0.9)
Monocyte %: 8.6 %
Neut # K/uL: 7.4 10*3/uL — ABNORMAL HIGH (ref 1.6–6.1)
Nucl RBC # K/uL: 0 10*3/uL (ref 0.0–0.0)
Nucl RBC %: 0 /100 WBC (ref 0.0–0.2)
Platelets: 463 10*3/uL — ABNORMAL HIGH (ref 160–370)
RBC: 3.1 MIL/uL — ABNORMAL LOW (ref 3.9–5.2)
RDW: 18.4 % — ABNORMAL HIGH (ref 11.7–14.4)
Seg Neut %: 66.1 %
WBC: 11.2 10*3/uL — ABNORMAL HIGH (ref 4.0–10.0)

## 2020-05-02 LAB — TIBC
Iron: 19 ug/dL — ABNORMAL LOW (ref 34–165)
TIBC: 454 ug/dL — ABNORMAL HIGH (ref 250–450)
Transferrin Saturation: 4 % — ABNORMAL LOW (ref 15–50)

## 2020-05-02 LAB — RETICULOCYTES
Retic %: 2.1 % (ref 0.7–2.1)
Retic Abs: 66 10*3/uL (ref 29.9–90.9)

## 2020-05-02 LAB — NEUTROPHIL #-INSTRUMENT: Neutrophil #-Instrument: 7.4 10*3/uL

## 2020-05-02 LAB — FERRITIN: Ferritin: 4 ng/mL — ABNORMAL LOW (ref 10–120)

## 2020-05-02 LAB — MULTIPLE ORDERING DOCS

## 2020-05-02 LAB — FOLATE: Folate: 13.2 ng/mL (ref >=4.6)

## 2020-05-02 LAB — LACTATE DEHYDROGENASE: LD: 228 U/L — ABNORMAL HIGH (ref 118–225)
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
## 2020-05-02 NOTE — Telephone Encounter (Signed)
I called and spoke to Jenna [name] about her critically low hemoglobin and iron levels.    [facility] infusion is not able to accommodate for her to get transfused here this weekend. The only option for her is to go the ED. On the phone with Jenna [name], I discussed the need to report to the ER and showing up to her IV iron infusion on Monday as scheduled. SW made transportation arrangements.     She plans to go the ED tomorrow morning. If her symptoms continues to worsen, she was instructed to go tot he ED soon.       Jenna [name] verbalized understanding.

## 2020-05-02 NOTE — Addendum Note (Signed)
Addended by: Sheilah Mins on: 05/02/2020 04:40 PM     Modules accepted: Orders
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
## 2020-05-03 ENCOUNTER — Emergency Department
Admission: EM | Admit: 2020-05-03 | Discharge: 2020-05-03 | Disposition: A | Discharge status: Home / Self Care | Payer: Medicaid (Managed Care) | Source: Ambulatory Visit | Attending: Emergency Medicine | Admitting: Emergency Medicine

## 2020-05-03 ENCOUNTER — Encounter: Payer: Self-pay | Admitting: Emergency Medicine

## 2020-05-03 DIAGNOSIS — D649 Anemia, unspecified: Secondary | ICD-10-CM | POA: Insufficient documentation

## 2020-05-03 DIAGNOSIS — R5383 Other fatigue: Secondary | ICD-10-CM

## 2020-05-03 LAB — CBC AND DIFFERENTIAL
Baso # K/uL: 0.1 10*3/uL (ref 0.0–0.1)
Basophil %: 0.5 %
Eos # K/uL: 0.2 10*3/uL (ref 0.0–0.4)
Eosinophil %: 1.3 %
Hematocrit: 25 % — ABNORMAL LOW (ref 34–45)
Hemoglobin: 6.6 g/dL — ABNORMAL LOW (ref 11.2–15.7)
IMM Granulocytes #: 0.1 10*3/uL — ABNORMAL HIGH (ref 0.0–0.0)
IMM Granulocytes: 0.5 %
Lymph # K/uL: 3.1 10*3/uL (ref 1.2–3.7)
Lymphocyte %: 25.7 %
MCH: 20 pg — ABNORMAL LOW (ref 26–32)
MCHC: 26 g/dL — ABNORMAL LOW (ref 32–36)
MCV: 75 fL — ABNORMAL LOW (ref 79–95)
Mono # K/uL: 0.7 10*3/uL (ref 0.2–0.9)
Monocyte %: 6.1 %
Neut # K/uL: 7.9 10*3/uL — ABNORMAL HIGH (ref 1.6–6.1)
Nucl RBC # K/uL: 0 10*3/uL (ref 0.0–0.0)
Nucl RBC %: 0.2 /100 WBC (ref 0.0–0.2)
Platelets: 486 10*3/uL — ABNORMAL HIGH (ref 160–370)
RBC: 3.4 MIL/uL — ABNORMAL LOW (ref 3.9–5.2)
RDW: 18.4 % — ABNORMAL HIGH (ref 11.7–14.4)
Seg Neut %: 65.9 %
WBC: 11.9 10*3/uL — ABNORMAL HIGH (ref 4.0–10.0)

## 2020-05-03 LAB — TYPE AND SCREEN
ABO RH Blood Type: A POS
Antibody Screen: NEGATIVE

## 2020-05-03 LAB — PLASMA PROF 7 (ED ONLY)
Anion Gap,PL: 13 (ref 7–16)
CO2,Plasma: 24 mmol/L (ref 20–28)
Chloride,Plasma: 105 mmol/L (ref 96–108)
Creatinine: 1.07 mg/dL — ABNORMAL HIGH (ref 0.51–0.95)
GFR,Black: 78 *
GFR,Caucasian: 68 *
Glucose,Plasma: 110 mg/dL — ABNORMAL HIGH (ref 60–99)
Potassium,Plasma: 4.4 mmol/L (ref 3.3–4.6)
Sodium,Plasma: 142 mmol/L (ref 133–145)
UN,Plasma: 19 mg/dL (ref 6–20)

## 2020-05-03 LAB — HOLD SST

## 2020-05-03 MED ORDER — SODIUM CHLORIDE 0.9 % FLUSH FOR PUMPS *I*
0.0000 mL/h | INTRAVENOUS | Status: DC | PRN
Start: 2020-05-03 — End: 2020-05-04

## 2020-05-03 MED ORDER — DEXTROSE 5 % FLUSH FOR PUMPS *I*
0.0000 mL/h | INTRAVENOUS | Status: DC | PRN
Start: 2020-05-03 — End: 2020-05-04

## 2020-05-03 NOTE — Discharge Instructions (Signed)
Please keep your currently scheduled infusion center appointment for iron and blood transfusion per your schedule.  Please return if you develop increased shortness of breath, chest pain, or other concerns.
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
## 2020-05-03 NOTE — ED Notes (Signed)
Patient ambulatory for discharge. IV site removed and VSS. Discharge instructions reviewed with patient and patient verbalized understanding. To follow up with provider. Belongings with patient.

## 2020-05-03 NOTE — ED Triage Notes (Signed)
In the process of diagnosis pt with unknown type of cancer. Oncology doctor advised pt to come in for a blood transfusion H/H 6.2/24. Pt was told to come in last night but could not due to childcare. +SOB on exertion. Arrives ambulatory.         Prehospital medications given: No

## 2020-05-03 NOTE — ED Provider Progress Notes (Signed)
ED Provider Progress Note    Pt. Now reporting she has to leave d/t emergent child care issues.  Given that emergent transfusion does not seem indicated, will discharge with good f/u instructions.  Have not had return call from heme-onc. As of this time.          Brain Hilts, MD, 05/03/2020, 6:16 PM     Brain Hilts, MD  05/03/20 [phone]

## 2020-05-05 ENCOUNTER — Ambulatory Visit: Payer: Medicaid (Managed Care) | Attending: Rehabilitative and Restorative Service Providers" | Admitting: Oncology

## 2020-05-05 ENCOUNTER — Encounter: Payer: Self-pay | Admitting: Oncology

## 2020-05-05 VITALS — BP 153/79 | HR 81 | Temp 98.6°F | Resp 18 | Wt 301.8 lb

## 2020-05-05 DIAGNOSIS — D500 Iron deficiency anemia secondary to blood loss (chronic): Secondary | ICD-10-CM | POA: Insufficient documentation

## 2020-05-05 MED ORDER — METHYLPREDNISOLONE SOD SUCC 125 MG IJ SOLR(62.5MG/ML) *WRAPPED*
125.0000 mg | Freq: Once | INTRAMUSCULAR | Status: AC
Start: 2020-05-05 — End: 2020-05-05
  Administered 2020-05-05: 125 mg via INTRAVENOUS
  Filled 2020-05-05: qty 2

## 2020-05-05 MED ORDER — SODIUM CHLORIDE 0.9 % IV SOLN WRAPPED *I*
200.0000 mg | Freq: Once | INTRAVENOUS | Status: AC
Start: 2020-05-05 — End: 2020-05-05
  Administered 2020-05-05: 200 mg via INTRAVENOUS
  Filled 2020-05-05: qty 10

## 2020-05-05 MED ORDER — FAMOTIDINE (PF) 20 MG/2ML IV SOLN *I*
20.0000 mg | Freq: Once | INTRAVENOUS | Status: AC
Start: 2020-05-05 — End: 2020-05-05
  Administered 2020-05-05: 20 mg via INTRAVENOUS
  Filled 2020-05-05: qty 2

## 2020-05-05 MED ORDER — ACETAMINOPHEN 325 MG PO TABS *I*
650.0000 mg | ORAL_TABLET | Freq: Once | ORAL | Status: AC
Start: 2020-05-05 — End: 2020-05-05
  Administered 2020-05-05: 650 mg via ORAL
  Filled 2020-05-05: qty 2

## 2020-05-05 NOTE — Progress Notes (Signed)
Notified by RN that [name] is due to be consented for blood transfusion.    The benefits, risks, and alternatives of blood transfusion were discussed with the patient.      Jenna [name] acknowledged understanding and questions were answered. She wishes to proceed. Blood transfusion consent signed and will be in patient's chart (placed in bin to scan into electronic record).     Dani Gobble, NP

## 2020-05-05 NOTE — Telephone Encounter (Signed)
I called Jenna [name] and she is able to arrive earlier at 1:30 PM today to receive a blood transfusion plus her scheduled iron infusion. She asked for help with transportation so I let her know I will ask the social worker to assist.    Dorcas Mcmurray, NP

## 2020-05-05 NOTE — Progress Notes (Signed)
WCI SOCIAL WORK:   [facility] Social Work Chief Executive Officer:     Patient referred based on distress screen?: No      Social Work contact made?: Yes      Method of contact:  Telephone    Social Work Assessment:  Barriers to Care    Barriers to care:  Transportation    Social Work post assessment plan:  Referral  Referral:     Transportation:  [facility]                     Updated pick up time with [employer] to 12:30 PM today    Time spent on this encounter (in minutes):  15    Covering Georgiann Cocker, LMSW    Patient has had another appointment added to her schedule today and updated pick up time to reflect a 1:30 appointment.    Patient aware and agreeable.  She inquired if lunch would be provided and I advised that she should bring her lunch from home.  She verbalized understanding and agreement.      Staff updated.    Christ Kick, LMSW, OSW-C  Senior Social Worker  [employer]  [phone]

## 2020-05-05 NOTE — Progress Notes (Signed)
WCI SOCIAL WORK:   [facility] Social Work Therapist, nutritional:     Patient referred based on distress screen?: No      Social Work contact made?: Yes      Method of contact:  In-person    Social Work Assessment:  Barriers to Care and Emotional    Barriers to care:  Transportation    Social Work post assessment plan:  As needed follow-up and Referral  Referral:     Transportation:  [facility]                     [facility]AS Online portal/ [phone]    Time spent on this encounter (in minutes):  30    Sw met with patient while in infusion to check in, offer support, discuss transportation, and assess for concerns at home.     Jenna [name] is a 35 yo who currently lives with her Fiance and they have 7 kids together  ( Sincere-16, [place], [place]- 8, [place], [place]- 3, twins Darlyn Chamber and Milagros Reap- 35yr old) she also has her Fiance's son who is 18yo. He is autistic and requires care. 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 She just started this about a week ago.     She states that things at home have been hard as she is always tired and has no energy. She is hopeful that now that she is being treated that she will begin to feel better. Writer agreed and offered reassurance and support which was received well.     Writer briefly discussed the SW role and educated that I can assist with transport needs. Writer states that patient will need to notify writer of these appts in order for me to assist. Pt agreeable. Will work on this months schedule as pt reports they have all been arranged.     Writer addressed DM concerns. Patient states that she feels safe at home. She denied any needs or concerns at this time. She is aware of how to reach Sw if needed.     JPati Gallo LMSW LMSW  5:10 PM  05/05/2020  [facility] Social Worker  Ph- [phone]

## 2020-05-05 NOTE — Progress Notes (Signed)
Patient arrived to clinic in stable condition to receive Venofer and 1 unit PRBCs - no new complaints to report. Patient tolerated IV Venofer and  blood transfusion well without complication. PIV provided blood return before and after treatment. Reiterated education on treatment regimen, side effects, when/how to call the clinic, etc. Encouraged to call with questions/concerns. Discharged in stable condition via wheelchair.

## 2020-05-06 LAB — ZINC: Zinc: 55 ug/dL — ABNORMAL LOW (ref 60.0–120.0)

## 2020-05-06 NOTE — Progress Notes (Signed)
WCI SOCIAL WORK:   [facility] Social Work Chief Executive Officer:     Patient referred based on distress screen?: No      Social Work contact made?: Yes      Method of contact:  Other    Other method of contact:  Mail    Social Work Assessment:  Surveyor, quantity    Social Work post assessment plan:  On-going follow-up and Referral  Referral:     Transportation:  [employer]                     MAS online portal/[phone]    Time spent on this encounter (in minutes):  15    Sw set the below transportation schedule for patients upcoming Heme appts.     Writer reviewed details with patient  She is agreeable and understanding. She is aware to contact Sw if anything changes.     05/12/20- Jenna [name] is scheduled for a Hematology/Blood appointment on 05/12/2020 at 4:00pm.  At 3:00pm [employer] will pick up Jenna [name] at [address], [place] and take them to [address] [place]  At 6:30pm [employer] will pick up Jenna [name] at [address], [place] and take them to [address], [address]  Jenna [name] may contact [employer] at [phone] for changes to pick-up and return times prior to this trip.  Invoice #  [id number]    05/19/20- Jenna [name] is scheduled for a Hematology/Blood appointment on 05/19/2020 at 3:30pm.  At 2:30pm [employer] will pick up Jenna [name] at [address] [place] and take them to [address], [place]  At 6:00pm [employer] will pick up Jenna [name] at [address], [place] and take them to [address], [address]  Jenna [name] may contact [employer] at [phone] for changes to pick-up and return times prior to this trip.  Invoice  # [id number]    05/26/20- Jenna [name] is scheduled for a Hematology/Blood appointment on 05/26/2020 at 3:30pm.  At 2:30pm [employer] will pick up Jenna [name] at [address], [place]  and take them to [address] [place]  At 6:00pm [employer] will pick up Jenna [name] at [address] [place] and take them to [address], [address]  Jenna [name] may contact [employer] at [phone] for changes to pick-up and return times prior to this trip.  Invoice # [id number]    06/02/20- Jenna [name] is scheduled for a Hematology/Blood appointment on 06/02/2020 at 3:30pm.  At 2:30pm [employer] will pick up Jenna [name] at [address], [place] and take them to [address], [place]  At 6:00pm [employer] will pick up Jenna [name] at [address], [place] and take them to [address], [address]  Jenna [name] may contact [employer] at [phone] for changes to pick-up and return times prior to this trip.  Invoice # [id number]    Brantley Persons, LMSW LMSW  9:59 AM  05/06/2020  [facility]  Oncology Social Worker  Ph- [phone]

## 2020-05-07 ENCOUNTER — Other Ambulatory Visit: Payer: Self-pay

## 2020-05-07 LAB — RED BLOOD CELLS
Coded Blood type: 6200
Coded Blood type: 6200
Component blood type: A POS
Component blood type: A POS
Dispense status: TRANSFUSED

## 2020-05-08 ENCOUNTER — Other Ambulatory Visit: Payer: Self-pay

## 2020-05-12 ENCOUNTER — Ambulatory Visit: Payer: Medicaid (Managed Care) | Attending: Rehabilitative and Restorative Service Providers"

## 2020-05-12 ENCOUNTER — Other Ambulatory Visit: Payer: Self-pay

## 2020-05-12 VITALS — BP 128/87 | HR 80 | Temp 98.4°F | Resp 18

## 2020-05-12 DIAGNOSIS — D500 Iron deficiency anemia secondary to blood loss (chronic): Secondary | ICD-10-CM | POA: Insufficient documentation

## 2020-05-12 MED ORDER — METHYLPREDNISOLONE SOD SUCC 125 MG IJ SOLR(62.5MG/ML) *WRAPPED*
125.0000 mg | Freq: Once | INTRAMUSCULAR | Status: AC
Start: 2020-05-12 — End: 2020-05-12
  Administered 2020-05-12: 125 mg via INTRAVENOUS
  Filled 2020-05-12: qty 2

## 2020-05-12 MED ORDER — FAMOTIDINE (PF) 20 MG/2ML IV SOLN *I*
20.0000 mg | Freq: Once | INTRAVENOUS | Status: AC
Start: 2020-05-12 — End: 2020-05-12
  Administered 2020-05-12: 20 mg via INTRAVENOUS
  Filled 2020-05-12: qty 2

## 2020-05-12 MED ORDER — SODIUM CHLORIDE 0.9 % IV SOLN WRAPPED *I*
200.0000 mg | Freq: Once | INTRAVENOUS | Status: AC
Start: 2020-05-12 — End: 2020-05-12
  Administered 2020-05-12: 200 mg via INTRAVENOUS
  Filled 2020-05-12: qty 200

## 2020-05-12 NOTE — Progress Notes (Signed)
CM called pt to check in and complete comp and crisis plans. Pt requested a call back the following day at 1pm.    [facility] Manager  [facility]  O: [phone]  C: [phone]

## 2020-05-12 NOTE — Progress Notes (Signed)
CM called pt to complete crisis plan and comp assessment over the phone.    Assessment Utilized: Crisis Plan  Names/Roles of participants: Messina Kosinski - pt/Antrone Walla - HHCM.  Description of Client participation and understanding: Pt understanding of the purpose of the crisis plan and fully participated.  Status of completion of assessment: Assessment completed during this phone call.  Summary of Key Crises identified: Pt becoming overwhelmed, may have anxiety attack.  Summary of preventative actions CM can support: CM reviewed mobile crisis unit and safety measures.  Summary of Support resources to contact if client is in crisis: Pt familiar with 211 and mobile crisis unit.   Status of provision of copy of the Plan to Client and Care Team: CM to send to pt and care team.    Assessment Utilized: Comprehensive Assessment  Names/Roles of participants: Stevee Valenta - pt/Lindsay Straka - [facility].  Description of Client participation: Pt fully participated in comp assessment.  Status of completion of assessment: Assessment completed during phone call.  Summary of findings relevant to Care Management work: Pt is behind on rent and RG&E. Will need help applying for assistance.  Next steps to address findings: CM to help with TA application.    [facility] Manager  [facility]  O: [phone]  C: [phone]

## 2020-05-12 NOTE — Progress Notes (Signed)
Patient was seen in infusion center for: Venofer infusion    Pre-medications given: [x] YES [] NO [] N/A  IV device: [x] PIV [] IVAD [] PICC [] TCVC  Blood return present before and after infusion: [x] YES [] NO     Patient tolerated Venofer well without incident. No adverse effects noted in 30 minute observation period, vital signs stable.     Patient educated regarding:   [x] YES [] NO drugs received    [x] YES [] NO toxities   [x] YES [] NO toxicity management  [x] YES [] NO follow up care     Upon completion of treatment, PIV flushed and removed, per protocol. Patient discharged home in stable condition, aware of future appointments. Encouraged to call with any questions or concerns.

## 2020-05-16 NOTE — Progress Notes (Signed)
CM called pt to complete TA application over the phone. Pt was getting kids ready for school during the phone call, so could not answer every question all at once. CM let pt know that each person in the household would need to be on the application including dates of birth and SS#'s. Pt expressed understanding, but did not have all the information at that time. CM suggested calling CM back to complete whenever she is able to obtain the information. Pt agreed to plan and thanked CM for help.    [facility] Manager  [facility]  O: [phone]  C: [phone]

## 2020-05-19 ENCOUNTER — Ambulatory Visit: Payer: Medicaid (Managed Care) | Attending: Rehabilitative and Restorative Service Providers"

## 2020-05-19 VITALS — BP 121/82 | HR 69 | Temp 97.0°F | Resp 18

## 2020-05-19 DIAGNOSIS — D500 Iron deficiency anemia secondary to blood loss (chronic): Secondary | ICD-10-CM

## 2020-05-19 MED ORDER — FAMOTIDINE (PF) 20 MG/2ML IV SOLN *I*
20.0000 mg | Freq: Once | INTRAVENOUS | Status: AC
Start: 2020-05-19 — End: 2020-05-19
  Administered 2020-05-19: 20 mg via INTRAVENOUS
  Filled 2020-05-19: qty 2

## 2020-05-19 MED ORDER — SODIUM CHLORIDE 0.9 % IV SOLN WRAPPED *I*
200.0000 mg | Freq: Once | INTRAVENOUS | Status: AC
Start: 2020-05-19 — End: 2020-05-19
  Administered 2020-05-19: 200 mg via INTRAVENOUS
  Filled 2020-05-19: qty 200

## 2020-05-19 MED ORDER — METHYLPREDNISOLONE SOD SUCC 125 MG IJ SOLR(62.5MG/ML) *WRAPPED*
125.0000 mg | Freq: Once | INTRAMUSCULAR | Status: AC
Start: 2020-05-19 — End: 2020-05-19
  Administered 2020-05-19: 125 mg via INTRAVENOUS
  Filled 2020-05-19: qty 2

## 2020-05-19 NOTE — Progress Notes (Signed)
Patient received venofer via PIV, tolerated well. Patient observed for 15 minutes post infusion because patient refused full 30 minute observation period. Prior to discharge, no signs or symptoms of reaction noted and VSS. PIV removed . Patient discharged to home.

## 2020-05-26 ENCOUNTER — Ambulatory Visit: Payer: Medicaid (Managed Care)

## 2020-05-28 ENCOUNTER — Ambulatory Visit: Payer: Medicaid (Managed Care)

## 2020-05-30 ENCOUNTER — Other Ambulatory Visit: Payer: Self-pay

## 2020-05-30 NOTE — Progress Notes (Signed)
CM called pt to check in. Pt states that she is worried that she has colon cancer. She states that because she has anemia and she is going to the bathroom all the time; patient states that her stool is orange or red or green and her abdomen hurts all the time. She also does not feel like she is emptying when she goes to the bathroom. Pt is requesting a colonoscopy if possible.   Pt also noted a sharp pain in her spine that goes up to the back of her neck. Feels like nerve pain and like her head is being "jerked." Is worried that this is possibly related to you all of her epidurals that she's had.    [facility] Manager  [facility]  O: [phone]  C: [phone]

## 2020-05-30 NOTE — Progress Notes (Signed)
Call to patient, no answer.   Voicemail message left requesting call back.   Call back number provided.

## 2020-06-02 ENCOUNTER — Ambulatory Visit: Payer: Medicaid (Managed Care) | Attending: Rehabilitative and Restorative Service Providers"

## 2020-06-02 VITALS — BP 168/79 | HR 75 | Temp 98.6°F | Resp 20

## 2020-06-02 DIAGNOSIS — D500 Iron deficiency anemia secondary to blood loss (chronic): Secondary | ICD-10-CM

## 2020-06-02 MED ORDER — FAMOTIDINE (PF) 20 MG/2ML IV SOLN *I*
20.0000 mg | Freq: Once | INTRAVENOUS | Status: AC
Start: 2020-06-02 — End: 2020-06-02
  Administered 2020-06-02: 20 mg via INTRAVENOUS
  Filled 2020-06-02: qty 2

## 2020-06-02 MED ORDER — METHYLPREDNISOLONE SOD SUCC 125 MG IJ SOLR(62.5MG/ML) *WRAPPED*
125.0000 mg | Freq: Once | INTRAMUSCULAR | Status: AC
Start: 2020-06-02 — End: 2020-06-02
  Administered 2020-06-02: 125 mg via INTRAVENOUS
  Filled 2020-06-02: qty 2

## 2020-06-02 MED ORDER — SODIUM CHLORIDE 0.9 % IV SOLN WRAPPED *I*
200.0000 mg | Freq: Once | INTRAVENOUS | Status: AC
Start: 2020-06-02 — End: 2020-06-02
  Administered 2020-06-02: 200 mg via INTRAVENOUS
  Filled 2020-06-02: qty 200

## 2020-06-02 NOTE — Progress Notes (Signed)
Pt was seen in infusion center for: Iron therapy     Pre-medications given: [x] YES [] NO [] N/A  IV device: [x] PIV [] IVAD [] PICC [] TCVC   Blood return present before and after infusion: [x] YES [] NO [] N/A     Patient tolerated Venofer well without incident. 30 minute post VS stable. Reinforced education on iron therapy, side effects, when/how to call the clinic, etc. Encouraged to call with questions/concerns. Patient discharged home in stable condition, aware of future appointments.

## 2020-06-03 NOTE — Progress Notes (Signed)
Call to patient  Spoke with Jenna [name]. Jenna [name] had expressed concerns to her Health Home care Production designer, theatre/television/film.    Report being worried about colon cancer and diabetes    Symptoms concerning her about colon cancer  -Anemia  -stomach issues   -abdominal pain   -frequency of bowel movements, multiple times a day, but not being able to have complete movements  -stool often gelatinous,  green, orange, variety of colors.   -all food immediately cause a bowel movement  -Nausea constant  -Body aches constant    Symptoms concerning for diabetes    - Family history (sister is diabetic)  - dry mouth  -urinary frequency, especially nocturnal   - high sugar intake  - black ring on neck (like sister)      Appointment made with Dr. Celedonio Savage for Wednesday 3/30 at 2:30pm.  Transportation has been arranged.    Trip Confirmation  Jenna [name] is scheduled for a Primary Care Office appointment on 06/11/2020 at 2:30pm.  At 1:30pm [facility] will pick up Jenna [name] at [address], [address] and take them to [address] [place], [place]  At 4:00pm [facility] will pick up Jenna [name] at [address] [place] and take them to [address], [address]  Jenna [name] may contact [facility] at [phone] for changes to pick-up and return times prior to this trip.  Jenna [name]'s Trip  Date: 06/11/2020 Time: 2:30pm Inv# [phone]    Patient called and given transportation information.

## 2020-06-09 ENCOUNTER — Other Ambulatory Visit: Payer: Self-pay

## 2020-06-11 ENCOUNTER — Ambulatory Visit: Payer: Medicaid (Managed Care) | Admitting: Student in an Organized Health Care Education/Training Program

## 2020-06-16 ENCOUNTER — Telehealth: Payer: Self-pay | Admitting: Internal Medicine

## 2020-06-16 NOTE — Telephone Encounter (Signed)
Copied from CRM #[id number]. Topic: Urgent/Emergent - Protocol-Specific Urgent  >> Jun 16, 2020  1:10 PM [name] wrote:  Jenna [name] requesting to speak with nursing    Jenna [name] reporting feels like she is dying shooting pain up her side and back 2 days    Kidneys and organs are hurting unable to bend around.    No difficulty breathing no chest pain    Warm transferred to [facility]  at [facility].     Declined call for ambulance.      September can be reached [phone]

## 2020-06-16 NOTE — Telephone Encounter (Signed)
Called and spoke with patient:     Pt reports that she is having generalized trunk pain (abdomen, sides, back) for a few days. Pt is unaware of cause and denies injury.   Pt states that she has decreased range of motion with turning side to side / swivel through trunk.     Pt states that she also has unusual vaginal discharge and odor. Pt states "I know for sure it's not an STD."     Pt denies pain/burning on urination, denies fever, chest pain, shortness of breath.     Pt admits to constipation but states that seems improved now. 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 Advised her to seek care this evening if worsening, otherwise scheduled for tomorrow afternoon at her request. Pt verbalized understanding.

## 2020-06-17 ENCOUNTER — Telehealth: Payer: Self-pay | Admitting: Internal Medicine

## 2020-06-17 ENCOUNTER — Ambulatory Visit: Payer: Medicaid (Managed Care)

## 2020-06-17 NOTE — Progress Notes (Deleted)
[facility]: Acute Visit Note    HPI/ROS:      Jenna [name] is a 35 y.o. female who presents acutely to clinic today for evaluation of generalized abdominal/back pain, in addition to concerns regarding vaginal discharge and odor.     Trunk pain: ***  - decreased ROM    Vaginal symptoms: ***    Of note, she has been following with oncology for iron supplementation (IV venofer). Last infusion on 3/21.    Physical Exam:     There were no vitals filed for this visit.    ***  General: Pleasant, conversant. No distress.  HEENT: Moist mucus membranes.  Pulmonary: Breathing comfortably. Lungs CTAB.   Cardiovascular: RRR. No murmurs, rubs, or gallops.  Abdominal: Soft. Non-distended. Non-tender. Bowel sounds present.  Extremities: Warm, dry. No peripheral edema.    Assessment and Plan:     Jenna [name] is a 35 y.o. female who presents today for *** with PMH of severe IDA due to chronic blood loss (heavy vaginal bleeding vs. ? Rectal bleeding), hidradenitis supportiva, asthma, and depression.    #***:    Follow up in *** for ***.     Tenny Craw, MD 06/17/2020 8:08 AM    Medications:     Medications reviewed and reconciled.  Medications:   Current Outpatient Medications   Medication Sig Note    omeprazole (PRILOSEC) 20 mg capsule Take 20 mg by mouth 2 times daily (Patient not taking: Reported on 05/02/2020)     acetaminophen (TYLENOL) 325 mg tablet Take 2 tablets (650 mg total) by mouth every 4 hours as needed 05/02/2020: PRN    albuterol HFA (PROVENTIL, VENTOLIN, PROAIR HFA) 108 (90 Base) MCG/ACT inhaler Inhale 1-2 puffs into the lungs every 6 hours as needed for Wheezing  Shake well before each use. (Patient not taking: Reported on 05/02/2020)     amLODIPine (NORVASC) 5 mg tablet Take 1 tablet (5 mg total) by mouth daily     ferrous sulfate 325 (65 FE) mg tablet Take 1 tablet (325 mg total) by mouth daily (with breakfast)     budesonide-formoterol (SYMBICORT) 160-4.5 MCG/ACT inhaler Inhale 2 puffs into the  lungs 2 times daily  Shake well before each use. 05/02/2020: PRN    docusate sodium (COLACE) 100 MG capsule Take 100 mg by mouth 3 times daily (Patient not taking: Reported on 05/02/2020)     FLUoxetine (PROZAC) 20 MG capsule Take 20 mg by mouth daily (Patient not taking: Reported on 05/02/2020)     nicotine (NICODERM CQ) 7 MG/24HR patch Place 1 patch onto the skin every 24 hours (Patient not taking: Reported on 05/02/2020)     phenazopyridine (PYRIDIUM) 100 MG tablet Take 100 mg by mouth (Patient not taking: Reported on 03/20/2020)     QUEtiapine (SEROQUEL) 200 MG tablet Take 200 mg by mouth daily (Patient not taking: Reported on 03/20/2020)     blood pressure monitor Use as directed (Patient not taking: Reported on 07/20/2018)      No current facility-administered medications for this visit.        Allergies:   No Known Allergies (drug, envir, food or latex)

## 2020-06-17 NOTE — Telephone Encounter (Signed)
Jenna [name] reporting is having very bad bleeding feels she has bladder infection.      Does not have way in the to office or hospital today other than medicaid.      Would like return call     [name] can be reached [phone]

## 2020-06-17 NOTE — Telephone Encounter (Signed)
Trip Confirmation  Mariame Rybolt is scheduled for a Primary Care Office appointment on 06/17/2020 at 3:00pm.  At 2:00pm [facility] will pick up [name] at [address], [address] and take them to [address] [place], [place]  At 4:30pm [facility] will pick up [name] at [address], [place] and take them to [address], [address]  Batoul Limes may contact [facility] at [phone] for changes to pick-up and return times prior to this trip.  Keylie Litsey's Trip  Date: 06/17/2020 Time: 3:00pm Inv# [phone]    Writer just called patient     Patient is all set

## 2020-06-17 NOTE — Telephone Encounter (Signed)
Spoke with patient briefly     She states the medicab never came, so she missed her appointment today  She states she will "call right back" to reschedule  Writer asked how patient was feeling, inquired about bleeding concern in prior message  Patient stated "I'm fine" and ended the call.

## 2020-06-17 NOTE — Telephone Encounter (Signed)
Copied from CRM [id number]. Topic: Access to Care - Refer to Specialty or Service  >> Jun 17, 2020 12:58 PM [name] wrote:  Patient is requesting transportation to their upcoming appointment on 4/5, 3:00 pm at [facility], [address], [place],  [place], [place]      Is the patient experiencing any symptoms? Yes  If yes, the symptoms are:  trunk abdominal pain was to be scheduled by office today for urgent visit in to the office   .    Pick up patient at (verified) home address? Yes      Special accommodations needed (walker, wheelchair, stretcher and/or type of vehicle)? no    Preferred transportation company?medicaid cab     Patient contact number  [phone]    Declined call for ambulance in to the hospital.    Is on the calendar 3:00 pm with [facility] today was scheduled yesterday.

## 2020-06-18 ENCOUNTER — Encounter: Payer: Self-pay | Admitting: Student in an Organized Health Care Education/Training Program

## 2020-06-18 ENCOUNTER — Telehealth: Payer: Self-pay | Admitting: Internal Medicine

## 2020-06-18 ENCOUNTER — Other Ambulatory Visit: Payer: Self-pay

## 2020-06-18 ENCOUNTER — Ambulatory Visit
Payer: Medicaid (Managed Care) | Attending: Internal Medicine | Admitting: Student in an Organized Health Care Education/Training Program

## 2020-06-18 VITALS — BP 158/78 | HR 79 | Temp 97.5°F | Ht 65.0 in | Wt 310.0 lb

## 2020-06-18 DIAGNOSIS — D509 Iron deficiency anemia, unspecified: Secondary | ICD-10-CM

## 2020-06-18 DIAGNOSIS — R102 Pelvic and perineal pain: Secondary | ICD-10-CM | POA: Insufficient documentation

## 2020-06-18 LAB — URINALYSIS WITH MICROSCOPIC
Bacteria,UA: NONE SEEN
Glucose,UA: NEGATIVE
Ketones, UA: NEGATIVE
Nitrite,UA: NEGATIVE
RBC,UA: >50 /hpf — AB (ref 0–2)
Specific Gravity,UA: 1.027 (ref 1.002–1.030)
pH,UA: 6 (ref 5.0–8.0)

## 2020-06-18 MED ORDER — ACETAMINOPHEN 500 MG PO TABS *I*
1000.0000 mg | ORAL_TABLET | Freq: Three times a day (TID) | ORAL | 0 refills | Status: DC | PRN
Start: 2020-06-18 — End: 2021-04-20
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
## 2020-06-18 NOTE — Telephone Encounter (Signed)
Copied from CRM #[id number]. Topic: Access to Care - Speak to Provider/Office Staff  >> Jun 18, 2020  9:41 AM Kathreen Cornfield wrote:  Ms. Wooley calling to report that she is experiencing pain in her side, abdominal and back and shooting up to her chest, Vaginal bleeding, patient stated she already had her menstrual period and this is unexplained. These symptoms have been going on for 4 day(s)    Patient requesting same day appointment? yes  Patient requesting call back? yes    Phone number confirmed at Telephone Information:  Mobile          [phone]    Patient stated she missed the appointment yesterday due to the medicaid cab and a family emergency.

## 2020-06-18 NOTE — Telephone Encounter (Addendum)
Writer called patient and relayed transportation information     Trip Confirmation  Jenna [name] is scheduled for a Primary Care Office appointment on 06/18/2020 at 3:45pm.  At 2:45pm [employer] will pick up Jenna [name] at [employer], [place] and take them to [address] [place], [place]  At [employer] will pick up Jenna [name] at [address] [place] and take them to [address], [address]  Jenna [name] may contact [employer] at [phone] for changes to pick-up and return times prior to this trip.  Jenna [name]'s Trip  Date: 06/18/2020 Time: 3:45pm Inv# [phone]

## 2020-06-18 NOTE — Telephone Encounter (Signed)
Spoke with patient    Reports pain in her sides, abdomen, lower back, shoulder blades  States this is an aching feeling  Present for about a week     Also reporting vaginal bleeding, she thought it was in her urine at first but now believes it is vaginal bleeding. States it "is not my period", reports it is different than her usual menstrual bleeding.   Denies excessive bleeding, or dizziness.   Bleeding has been occuring on and off over a week   Also reporting some nausea    Scheduled for follow up today with team resident. Land will reach out to PSS to assist with scheduling transportation as Clinical research associate is unable to do so at this time, patient verbalized understanding.

## 2020-06-19 ENCOUNTER — Telehealth: Payer: Self-pay | Admitting: Internal Medicine

## 2020-06-19 LAB — AEROBIC CULTURE: Aerobic Culture: 0

## 2020-06-19 LAB — CHLAMYDIA PLASMID DNA AMPLIFICATION: Chlamydia Plasmid DNA Amplification: 0

## 2020-06-19 LAB — N. GONORRHOEAE DNA AMPLIFICATION: N. gonorrhoeae DNA Amplification: 0

## 2020-06-19 LAB — TRICHOMONAS DNA AMPLIFICATION: Trichomonas DNA amplification: 0

## 2020-06-19 NOTE — Telephone Encounter (Signed)
Copied from CRM #[id number]. Topic: Access to Care - Speak to Provider/Office Staff  >> Jun 19, 2020  9:20 AM [name] wrote:  Ms. Bickle is requesting a return call to review her recent test results.    What type of test was done? Urine test    Where was the test done? [facility] office    On what day was the test done? 06/18/2020    Patient says she is passing big blood clots after her visit to the [facility] office yesterday 06/18/2020.    Also patient is in pain.    Please return the patients call at Telephone Information:  Mobile          [phone]

## 2020-06-19 NOTE — Telephone Encounter (Signed)
Copied from CRM #[id number]. Topic: Access to Care - Speak to Provider/Office Staff  >> Jun 19, 2020 10:52 AM Kathreen Cornfield wrote:  [name] calling to report that she is experiencing Vaginal bleeding, with large clots, abdomen pain. These symptoms have been going on for a while now patient declined to say how long  Patient requesting same day appointment? yes  Patient requesting call back? yes    Phone number confirmed at Telephone Information:  Mobile          [phone]    Patient stated she has been having this issue on and off.

## 2020-06-19 NOTE — Telephone Encounter (Signed)
Call to patient  Spoke with Jenna [name] home yesterday after appointment. Felt large "gush".  Passed large blood clots in toilet, on floor, in her undergarments    Abdomen pain few weeks ago, saw blood in urine.  Seen yesterday in clinic. Pain and bleeding now worse.    Reports  -severe Stomach pain  -Wearing depends, double layer because bleeding is so excessive    Has not been able to get blood work done.  Patient calling about urine test results.  Explained that most tests were not back yet.    Advised patient to go to ED as she was experiencing extremely heavy bleeding heavier than her normal heavy menstrual cycle and she is not due to have her cycle, along with severe abdominal pain.    Patient verbalized understanding.

## 2020-06-24 ENCOUNTER — Telehealth: Payer: Self-pay

## 2020-06-24 NOTE — Progress Notes (Signed)
CM met with pt during her primary care appt. CM helped pt to complete her TA application and discussed ideas for care plan. Pt would like to work on her nutrition and eating better. Her anemia is also a concern and she feels that the infusions were very helpful.    [facility]  O: [phone]  C: [phone]

## 2020-06-24 NOTE — Telephone Encounter (Signed)
Patient called in stating she's having heavy vaginal bleeding & may need a need a hysterectomy per her PCP. Writer reviewed her chart, she was last physically seen in [facility] 2.8.19. Patient was advised it's been more than 3 years since we last saw her & she'll need to re-establish care. For new patient visits we are currently scheduling in mid to late July. Writer checked to see if she perhaps her PCP submitted a referral due to her current issues. Writer noticed she was referred to [facility] on 1.6.22. Writer advised the patient of the referral & she stated she cancelled that appointment because she thought she was an "Active" patient with [facility]). She was advised with her referral she can be seen sooner @ [facility] than here. Patient requested their number & stated she'll call them.

## 2020-06-26 ENCOUNTER — Telehealth: Payer: Self-pay | Admitting: Internal Medicine

## 2020-06-26 ENCOUNTER — Other Ambulatory Visit: Payer: Self-pay

## 2020-06-26 NOTE — Telephone Encounter (Signed)
Spoke with patient   States her symptoms are the same as when she was last seen    Wondering if she can have a routine pap smear, no specific concerns.  She has not called yet to establish with [facility].     Advised writer is waiting to hear back regarding lab results, will follow up tomorrow. Patient verbalized understanding.

## 2020-06-26 NOTE — Telephone Encounter (Signed)
Copied from CRM #[id number]. Topic: Access to Care - Speak to Provider/Office Staff  >> Jun 26, 2020 10:08 AM [name] wrote:  [name] calling to report that she is experiencing both sides are hurting.   These symptoms have been going on for 1 month(s)  Patient says she has been seen for it previously.    Also patient says she needs a pap smear.    Patient requesting same day appointment?   Patient requesting call back? Yes    Separate message for test results.    Phone number confirmed at Telephone Information:  Mobile          [phone]

## 2020-06-26 NOTE — Telephone Encounter (Signed)
Copied from [place] [id number]. Topic: Access to Care - Labs/Orders/Imaging  >> Jun 26, 2020 10:05 AM Brendia Sacks wrote:  Jenna [name] is requesting a return call to review her recent test results.    What type of test was done? Urine test    Where was the test done? [facility] office    On what day was the test done? 1 week ago    Separate message to triage nurse.    Please return the patients call at Telephone Information:  Mobile          [phone]

## 2020-06-30 NOTE — Progress Notes (Signed)
CM called pt to review care plan goals; Durwin Nora answered and took message for pt.     [facility] Manager  [facility]  O: [phone]  C: [phone]

## 2020-07-01 ENCOUNTER — Encounter: Payer: Self-pay | Admitting: Internal Medicine

## 2020-07-01 NOTE — Telephone Encounter (Signed)
Writer called patient and scheduled her transportation for a 1:00 pm pick-up time for her to be brought to the lab     Trip Confirmation  Jenna [name] is scheduled for a Lab Tests appointment on 07/02/2020 at 2:00pm.  At 1:00pm [facility] will pick up Jenna [name] at [address], [address] and take them to [address] [place], [place]  At 3:30pm [facility] [place] will pick up Jenna [name] at [address] [place] and take them to [address], [address]  Jenna [name] may contact [facility] [facility] at [phone] for changes to pick-up and return times prior to this trip.  Jenna [name]'s Trip  Date: 07/02/2020 Time: 2:00pm Inv# [phone]

## 2020-07-01 NOTE — Telephone Encounter (Addendum)
A user error has taken place: encounter opened in error, closed for administrative reasons.

## 2020-07-01 NOTE — Telephone Encounter (Signed)
Spoke with patient   States vaginal bleeding had stopped after last visit, started again yesterday but "not nearly as bad as before"  Still experiencing sharp abdominal and lower back pains and nausea   No emesis   Feels like all she wants to do is sleep, feeling fatigued but overall about the same   She is wondering if she needs a blood transfusion. Advised patient still has blood work ordered she needs to complete, she is aware but has not been able to get to the lab. She is requesting transportation to go to the lab tomorrow, declined to go today. Land will reach out to team PSS to assist with this, patient verbalized understanding.     States she called [facility] to set up her OBGYN appointment, was told they did not see a referral for her and was offered an appointment in July, but she hung up on them out of frustration because she states she felt this was too far out.     Patient is waiting to hear back about her urine test results, advised writer will reach out to her provider again, patient verbalized understanding.

## 2020-07-01 NOTE — Telephone Encounter (Signed)
Copied from CRM #[id number]. Topic: Access to Care - Labs/Orders/Imaging  >> Jul 01, 2020 11:54 AM Jenna [name] wrote:  Patient reports continued vaginal bleeding.  She states she is not able to get an appt with an OBGYN for months.  She is very tired and sleeping all the time.    She is requesting a call back to see what she needs to do.     Patient contact number is  [phone] [facility]

## 2020-07-01 NOTE — Telephone Encounter (Signed)
See alternate telephone encounter

## 2020-07-02 ENCOUNTER — Other Ambulatory Visit: Payer: Self-pay | Admitting: Student in an Organized Health Care Education/Training Program

## 2020-07-02 DIAGNOSIS — N920 Excessive and frequent menstruation with regular cycle: Secondary | ICD-10-CM

## 2020-07-02 NOTE — Telephone Encounter (Signed)
Spoke with patient, relayed urine test results, she verbalized understanding    She was supposed to get her blood work drawn today at a lab (transportaiton was arranged for her) but states she did not go because "I have a lot going on"    Advised provider can place a new referral for her to follow up with an OBGYN, clarified that she would prefer to have a referral placed for the [facility] [place] [facility]. Land will request that referral be placed, patient verbalized understanding and ended the call.          [name], MD  Sim Nurse Al Corpus 2 hours ago (12:58 PM)     SR    Also, it looks like she was referred to [facility] back in January on my chart review and somebody from [facility] called her about the referral and advised she try to get an appointment with them, as the referral has been active for longer and she is more likely to get seen earlier there. Please let me know if you need me to place another referral, but I would start with them. I think somebody reached out to her on 4/14 and advised her to call then.     Thanks,  Sarah    Message text       Earlean Polka, Theresia Lo, MD  Sim Nurse Venetia Maxon, MD 22 hours ago (5:29 PM)     SR    Hi Adelina Mings and Tobi Bastos,     It looks like she had testing for STI's, all of which came back negative. Her urine did not show evidence of infection. However, she did have a significant amount of blood in her urine. Was she on her period at the time? If not, this definitely needs to be investigated further.     Tobi Bastos- including you in the conversation since you saw Vicenta last and I couldn't tell from your note if she was currently having the menorrhagia or if that was not occurring at the time of her visit. If you have any thoughts based on your discussion with her, I'd welcome your input!     Thanks,  [employer] text

## 2020-07-04 NOTE — Telephone Encounter (Signed)
Writer called patient to let her know referral has been placed for [facility] and offered patient the number    Patient declined number and stated she already has it.     Writer also let patient know that her provider has placed lab work to get completed and she would like her to get it done as soon as possible. Patient agreed to come to the lab on Monday. Writer scheduled patient's transportation     Trip Confirmation  Jenna [name] is scheduled for a Primary Care Office appointment on 07/07/2020 at 4:00pm.  At 3:00pm [facility] will pick up Jenna [name] at [address], [address] and take them to [address] [place], [place]  At 5:30pm [facility] will pick up [name] at [address] [place] and take them to [address], [address]  Jenna [name] may contact [facility] at [phone] for changes to pick-up and return times prior to this trip.  Jenna [name]'s Trip  Date: 07/07/2020 Time: 4:00pm Inv# [phone]

## 2020-07-11 ENCOUNTER — Other Ambulatory Visit: Payer: Self-pay

## 2020-07-11 NOTE — Progress Notes (Signed)
Transportation Scheduled  Transportation Vendor: [employer]thold Date: 08/05/20  Pickup Location: [address]  Pickup Time: 1:30PM  Appt Location: [address] [address]  Appt Time: 2:30PM  Payer: Medicaid  Invoice: [id number]    [facility] Manager  [facility]  O: [phone]  C: [phone]

## 2020-07-15 ENCOUNTER — Other Ambulatory Visit: Payer: Self-pay

## 2020-07-17 NOTE — Progress Notes (Signed)
CM called pt to check in and finish care plan. Pt stated that she was not feeling well and requested a call back later. CM will try back.    [facility] Manager  [facility]  O: [phone]  C: [phone]

## 2020-08-05 ENCOUNTER — Telehealth: Payer: Self-pay

## 2020-08-05 ENCOUNTER — Ambulatory Visit: Payer: Medicaid (Managed Care) | Admitting: Obstetrics and Gynecology

## 2020-08-05 NOTE — Telephone Encounter (Signed)
Patient missed an appt on 08/05/20 , patient was sent a no show#1.

## 2020-08-08 ENCOUNTER — Encounter: Payer: Self-pay | Admitting: Gastroenterology

## 2020-08-09 NOTE — Progress Notes (Signed)
CM called pt to finalize care plan. Pt asked for call back in 20 mins. CM called back, however call went to VM. CM left a brief message requesting a call back.    [facility] Manager  [facility]  O: [phone]  C: [phone]

## 2020-08-12 ENCOUNTER — Encounter: Payer: Self-pay | Admitting: Gastroenterology

## 2020-08-12 NOTE — Progress Notes (Signed)
CM called pt to finalize care plan.    Care Plan Development  Names/Roles of participants: Jenna [name] - pt/[name] - [facility].  Description of Client participation: Pt fully engaged during care plan development.  Goal identified by client: "To not die. To get myself at a healthy pace."  Goals identified by supports/providers: Pt encouraged to continue hematology appts and iron infusions, as well as possible hysterectomy.  Efforts made to reconcile goals if needed: Pt has been attending he infusion appts, however she missed her OBGYN appt for hysterectomy planning.  Objectives Identified to achieve goal: Have increased energy and decreased bleeding.   Interventions/Steps identified to achieve objectives: CM will assist pt with transportation as needed.  Timeframes identified to achieve interventions and client agreement: Over the next year.  Deferred Goals/Objectives Identified and reason for deferral: Pt does not want to take medications for her depression at this time.  Client strengths that will help in goal attainment: Pt is social and working to take care of her children.  Barriers that may impact goal attainment: Pt has appts for her children regularly and has little time to care for herself.  Plan to provide copy to client/supports/providers: CM will send to pt and care team.    [facility] Manager  [facility]  O: [phone]  C: [phone]

## 2020-09-08 ENCOUNTER — Telehealth: Payer: Self-pay | Admitting: Internal Medicine

## 2020-09-08 ENCOUNTER — Other Ambulatory Visit: Payer: Self-pay

## 2020-09-08 NOTE — Telephone Encounter (Signed)
Writer called patient to schedule follow up visit. Patient requesting transportation through [employer] for ride to follow up visit.    Trip Confirmation  Maegen Wigle is scheduled for a Primary Care Office appointment on 09/19/2020 at 3:00pm.  At 2:00pm [facility] will pick up [name] at [address], [place] and take them to [address] [place], [place]  At 4:30pm [facility] will pick up [name] at [address] [place] and take them to [address], [address]  Kelyn Ponciano may contact [employer] at [phone] for changes to pick-up and return times prior to this trip.      Haliegh Lucien's Trip  Date: 09/19/2020 Time: 3:00pm Inv# [phone]

## 2020-09-08 NOTE — Progress Notes (Signed)
Writer called patient to set up a follow up visit. Patient was scheduled for a follow up. Patient requested transportation be set up for this visit. Please see separate encounter for transportation.

## 2020-09-08 NOTE — Progress Notes (Signed)
CM called pt to check in; pt is waiting to hear from [facility] because her case needs to be renewed. She requested help sending paperwork in to [facility]; CM offered to send it through email. Pt agreed to plan.    Pt not feeling well physically. She is wondering if she could schedule an appt in a few weeks so that she has time to plan. She is having ongoing back pain and abdominal pain.    [facility] Manager  [facility]  O: [phone]  C: [phone]

## 2020-09-18 NOTE — Progress Notes (Deleted)
[facility] Follow-up Note     Reason For Visit:   Follow-up      Subjective:   Jenna [name] is a 35 y.o. year old female  presenting today to [facility] for follow-up. Issues discussed by problem below.    Agenda:  "ongoing back pain and abdominal pain."  "TB shot      Medications:     Current Outpatient Medications   Medication Sig    acetaminophen (TYLENOL) 500 mg tablet Take 2 tablets (1,000 mg total) by mouth 3 times daily as needed    omeprazole (PRILOSEC) 20 mg capsule Take 20 mg by mouth 2 times daily (Patient not taking: Reported on 05/02/2020)    albuterol HFA (PROVENTIL, VENTOLIN, PROAIR HFA) 108 (90 Base) MCG/ACT inhaler Inhale 1-2 puffs into the lungs every 6 hours as needed for Wheezing  Shake well before each use. (Patient not taking: Reported on 05/02/2020)    amLODIPine (NORVASC) 5 mg tablet Take 1 tablet (5 mg total) by mouth daily    budesonide-formoterol (SYMBICORT) 160-4.5 MCG/ACT inhaler Inhale 2 puffs into the lungs 2 times daily  Shake well before each use.    docusate sodium (COLACE) 100 MG capsule Take 100 mg by mouth 3 times daily (Patient not taking: Reported on 05/02/2020)    FLUoxetine (PROZAC) 20 MG capsule Take 20 mg by mouth daily (Patient not taking: Reported on 05/02/2020)    nicotine (NICODERM CQ) 7 MG/24HR patch Place 1 patch onto the skin every 24 hours (Patient not taking: Reported on 05/02/2020)    phenazopyridine (PYRIDIUM) 100 MG tablet Take 100 mg by mouth (Patient not taking: Reported on 03/20/2020)    QUEtiapine (SEROQUEL) 200 MG tablet Take 200 mg by mouth daily (Patient not taking: Reported on 03/20/2020)    blood pressure monitor Use as directed (Patient not taking: Reported on 07/20/2018)       ***Medications reviewed and reconciled.   Physical Exam:     There were no vitals filed for this visit.  Wt Readings from Last 3 Encounters:   06/18/20 (!) 140.6 kg (310 lb)   05/05/20 136.9 kg (301 lb 12.8 oz)   05/03/20 (!) 140.6 kg (310 lb)     BP  Readings from Last 3 Encounters:   06/18/20 158/78   06/02/20 168/79   05/19/20 121/82     General: In no apparent distress.  HEENT: MMM  Pulmonary: CTA B/L. No wheezes, rhonchi, or rales.  Cardiovascular: No JVD, RRR. No murmurs, rubs, or gallops.   Abdominal: Soft. Non-distended. Non-tender.  Extremities: No peripheral edema.   Neuro: A&O to conversation, face symmetric, speech/language WNL, moving all extremities  Skin: No lesions    Assessment and Plan:   Jenna [name] is a 35 y.o. year old female with PMH significant for class III obesity, HTN, IDA, presenting today for follow-up. Plan discussed by problem below.    No diagnosis found.    No follow-ups on file.    There are no discontinued medications.      Health Maintenance: ***    Medication Changes:  ***      Follow up: PRN***     ***THIS IS UNFINISHED NOTE- ASSESSMENT AND PLAN ARE NOT FINALIZED"***  Assessment and plan discussed with Dr. ***, attending physician.    Hardie Pulley, MD  [facility] PGY2  09/18/2020

## 2020-09-19 ENCOUNTER — Ambulatory Visit: Payer: Medicaid (Managed Care) | Admitting: Student in an Organized Health Care Education/Training Program

## 2020-09-19 NOTE — Progress Notes (Addendum)
Patient confirmed that she was in fact menstruating at the time of her urine collection on 4/6.     Jenna Bal, MD

## 2020-10-03 ENCOUNTER — Other Ambulatory Visit: Payer: Self-pay

## 2020-10-08 ENCOUNTER — Other Ambulatory Visit: Payer: Self-pay

## 2020-10-08 ENCOUNTER — Telehealth: Payer: Self-pay | Admitting: Internal Medicine

## 2020-10-08 NOTE — Progress Notes (Signed)
CM called pt to check in; she states that she is being evicted on 8/15. CM sent immediate listings to pt via text message.     Pt reports that she is also having trouble staying awake throughout the day and is very fatigued. When she climbs stairs, she feels like her heart is going to "jump out" of her chest. She has also been having issues with constant spinal pain. She is aware that she missed her last appt and would like to reschedule when possible.    [facility] Manager  [facility]  O: [phone]  C: [phone]

## 2020-10-08 NOTE — Progress Notes (Signed)
Called and spoke to patient.    She is feeling very fatigued. Thinks needs blood transfusion. Said she has had before. Unable to get clear answer as to why she thinks she needs blood transfusion.    Sometimes light-headed/dizzy.    Out of breath when climbs stairs, and heart racing.    Back pain all the time. Back cracks when she moves.    Apt made for 7/29. Patient agrees with plan. Transportation arranged.    Advised pt to call in the meantime with questions or concerns, or to seek care with worsening symptoms. Patient verbalized understanding.     Mykenzie Ebanks is scheduled for a Primary Care Office appointment on 10/10/2020 at 11:15am.  At 10:15am [facility] will pick up [name] at [address], [address] and take them to [address] [place], [place]  At 12:45pm [facility] will pick up [name] at [address] [place] and take them to [address], [address]  Aynsley Fleet may contact [facility] at [phone] for changes to pick-up and return times prior to this trip.

## 2020-10-08 NOTE — Telephone Encounter (Signed)
Copied from CRM #[id number]. Topic: Access to Care - Speak to Provider/Office Staff  >> Oct 08, 2020 11:06 AM Jenna [name] wrote:  Patient requesting call back from Corrine in social work. She states she's about to be homeless, needs housing information, needs paperwork filled out to get food stamps    Patient said it's an emergency that she talks to Corrine    Patient contact number is  [phone]

## 2020-10-09 ENCOUNTER — Encounter: Payer: Self-pay | Admitting: Gastroenterology

## 2020-10-09 NOTE — Progress Notes (Signed)
CM called pt to check in; pt is being sanctioned by [facility]; her fiance needs to send in paperwork to provide his income. Pt was cut off from food stamps and originally received a notification in April. Pt is also facing eviction from her home. She and her fiance are in contact with the [employer] for assistance. Pt requested help with hygiene products and food. CM submitted request to [employer] and [employer]. Products from [employer] were picked up and dropped off on 7/27.    [facility] Manager  [facility]  O: [phone]  C: [phone]

## 2020-10-10 ENCOUNTER — Ambulatory Visit: Payer: Medicaid (Managed Care) | Admitting: Emergency Medicine

## 2020-10-21 ENCOUNTER — Other Ambulatory Visit: Payer: Self-pay

## 2020-10-21 ENCOUNTER — Telehealth: Payer: Self-pay | Admitting: Internal Medicine

## 2020-10-21 NOTE — Telephone Encounter (Signed)
Copied from CRM [id number]. Topic: Access to Care - Speak to Provider/Office Staff  >> Oct 21, 2020 11:29 AM Arnette Felts wrote:  Jerusha is calling to speak with corrine she states this is regarding an emergency     [phone] [facility]

## 2020-10-22 ENCOUNTER — Other Ambulatory Visit: Payer: Self-pay

## 2020-10-22 ENCOUNTER — Ambulatory Visit (INDEPENDENT_AMBULATORY_CARE_PROVIDER_SITE_OTHER): Payer: 59 | Admitting: Orthopaedic Surgery

## 2020-10-22 ENCOUNTER — Encounter: Payer: Self-pay | Admitting: Orthopaedic Surgery

## 2020-10-22 ENCOUNTER — Ambulatory Visit: Payer: Self-pay

## 2020-10-22 ENCOUNTER — Ambulatory Visit (INDEPENDENT_AMBULATORY_CARE_PROVIDER_SITE_OTHER): Payer: 59

## 2020-10-22 DIAGNOSIS — M659 Synovitis and tenosynovitis, unspecified: Secondary | ICD-10-CM

## 2020-10-22 DIAGNOSIS — M222X1 Patellofemoral disorders, right knee: Secondary | ICD-10-CM

## 2020-10-22 DIAGNOSIS — M222X2 Patellofemoral disorders, left knee: Secondary | ICD-10-CM

## 2020-10-22 MED ORDER — PREDNISONE 10 MG (21) PO TBPK: ORAL_TABLET | ORAL | 0 refills | Status: DC

## 2020-10-22 MED ORDER — DICLOFENAC SODIUM 75 MG PO TBEC: 75.0000 mg | DELAYED_RELEASE_TABLET | Freq: Two times a day (BID) | ORAL | 0 refills | Status: DC | PRN

## 2020-10-22 NOTE — Progress Notes (Signed)
Office Visit Note   Patient: Samantha Nguyen           Date of Birth: May 14, 1985           MRN: [id number] Visit Date: 10/22/2020              Requested by: Fayrene Helper, NP [address] [place],  [name] [address] PCP: Fayrene Helper, NP   Assessment & Plan: Visit Diagnoses:  1. Tenosynovitis of right wrist   2. Patellofemoral syndrome, bilateral     Plan: Impression is right thumb pain likely from stenosing tenosynovitis and bilateral patellofemoral syndrome.  In regards to the thumb, we will continue with immobilization of the brace.  We will also start her on a steroid taper followed by 3 weeks of NSAIDs.  If her symptoms do not improve at that point she will follow up with [name].  In regards to the knees, we provided her with a PSO braces for both sides.  We have also sent in an internal referral for physical therapy.  Follow-up with [name] as needed.  Call with concerns or questions.  Follow-Up Instructions: Return if symptoms worsen or fail to improve.   Orders:  Orders Placed This Encounter  Procedures   XR Wrist Complete Right   XR KNEE 3 VIEW RIGHT   XR KNEE 3 VIEW LEFT   Ambulatory referral to Physical Therapy   Meds ordered this encounter  Medications   predniSONE (STERAPRED UNI-PAK 21 TAB) 10 MG (21) TBPK tablet    Sig: Take as directed    Dispense:  21 tablet    Refill:  0   diclofenac (VOLTAREN) 75 MG EC tablet    Sig: Take 1 tablet (75 mg total) by mouth 2 (two) times daily as needed. Do not take until finished with steroid taper    Dispense:  60 tablet    Refill:  0       Procedures: No procedures performed   Clinical Data: No additional findings.   Subjective: Chief Complaint  Patient presents with   Right Wrist - Pain   Right Knee - Pain   Left Knee - Pain    HPI patient is a pleasant 35 year old right-hand-dominant female who comes in today with right thumb and bilateral knee pain left greater than right.  In regards to her hand, she is  having pain primarily over the A1 pulley of the right thumb.  No pain to the actual wrist.  This is been ongoing for the past few months and is progressively worsened.  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  She notes associated popping to the thumb.  No actual triggering.  There are no specific aggravators.  She has been wearing a brace but is unsure whether this helps.  She denies any paresthesias.  In regards to the knees, she has had pain here for a while.  No known injury or change in activity.  Left knee is worse than the right.  She notes some instability with pivoting.  She does not take medication for this.  She has not been to physical therapy.  Review of Systems detailed in HPI.  All others reviewed and are negative.   Objective: Vital Signs: There were no vitals taken for this visit.  Physical Exam well-developed well-nourished female in no acute distress.  Alert and oriented x3.  Ortho Exam examination of the right hand and wrist shows moderate tenderness along the A1 pulley of  the thumb.  No reproducible triggering.  No tenderness along the first dorsal compartment.  No ulnar-sided tenderness.  Full range of motion of the wrist without pain.  Bilateral knee exam shows valgus deformity with increased Q angle.  Mild patellofemoral crepitus both sides.  Range of motion -10 to 125 degrees.  No joint line tenderness.  Slight tenderness to the lateral patella facet both sides.  Collaterals and cruciates are stable.  She is neurovascular intact distally.  Specialty Comments:  No specialty comments available.  Imaging: XR Wrist Complete Right  Result Date: 10/22/2020 Ulnar negative and increased radial inclination.  No other acute or structural abnormalities  XR KNEE 3 VIEW LEFT  Result Date: 10/22/2020 Lateral tracking of the patella.  Otherwise, no acute or structural abnormalities  XR KNEE 3 VIEW RIGHT  Result Date: 10/22/2020 Lateral tracking of the  patella.  Otherwise, no acute or structural abnormalities    PMFS History: Patient Active Problem List   Diagnosis Date Noted   Sepsis due to urinary tract infection (HCC) 08/24/2016   Past Medical History:  Diagnosis Date   Anxiety    Depressed    History of kidney stones    Migraine     History reviewed. No pertinent family history.  Past Surgical History:  Procedure Laterality Date   CESAREAN SECTION     CYSTOSCOPY W/ URETERAL STENT PLACEMENT Right 08/24/2016   Procedure: CYSTOSCOPY WITH RETROGRADE PYELOGRAM/URETERAL STENT PLACEMENT;  Surgeon: Malen Gauze, MD;  Location: [facility];  Service: Urology;  Laterality: Right;   CYSTOSCOPY WITH RETROGRADE PYELOGRAM, URETEROSCOPY AND STENT PLACEMENT Right 09/09/2016   Procedure: CYSTOSCOPY WITH RETROGRADE PYELOGRAM, URETEROSCOPY AND STENT REPLACEMENT;  Surgeon: Malen Gauze, MD;  [facility];  Service: Urology;  Laterality: Right;   MANDIBLE SURGERY     Social History   Occupational History   Not on file  Tobacco Use   Smoking status: Former   Smokeless tobacco: Never  Substance and Sexual Activity   Alcohol use: Yes    Comment: occasionally   Drug use: No   Sexual activity: Not on file

## 2020-10-25 ENCOUNTER — Encounter: Payer: Self-pay | Admitting: Gastroenterology

## 2020-10-25 NOTE — Progress Notes (Signed)
CM returned call to pt. She states that she is being evicted on 8/15 and does not know where to go at this time. CM called [facility], they shared that a room may be opening up in the next week and that pt should call back on Monday.     CM also called [employer] community cupboard for assistance with donations for pt, however pt often uses this service and just received a delivery from them on 8/4. The cupboard will not assist pt again until September    CM communicated this to pt, who was grateful for the assistance.    [facility] Manager  [facility]  O: [phone]  C: [phone]

## 2020-10-27 ENCOUNTER — Encounter: Payer: Self-pay | Admitting: Gastroenterology

## 2020-10-27 ENCOUNTER — Other Ambulatory Visit: Payer: Self-pay

## 2020-10-27 NOTE — Progress Notes (Signed)
CM called pt to check in; call went to VM. CM left a brief message requesting a call back.    Mckenlee Mangham  Health [facility] Manager  [facility]  O: [phone]  C: [phone]

## 2020-10-27 NOTE — Progress Notes (Signed)
Today covering CM picked up food and hygiene items at [facility] for patient. CM then brought the items to patients home and dropped them off to her. Patient was appreciative of CM's efforts. CM provided his phone number to patient while her CM is out of the office. Patient will notify CM with any other concerns that come up.

## 2020-10-31 ENCOUNTER — Ambulatory Visit: Payer: Medicaid (Managed Care) | Admitting: Rehabilitative and Restorative Service Providers"

## 2020-11-05 ENCOUNTER — Other Ambulatory Visit: Payer: Self-pay

## 2020-11-05 ENCOUNTER — Ambulatory Visit: Payer: Medicaid (Managed Care) | Admitting: Hematology & Oncology

## 2020-11-05 ENCOUNTER — Encounter: Payer: Self-pay | Admitting: Gastroenterology

## 2020-11-05 NOTE — Progress Notes (Signed)
Pt called CM to request further help with food. CM called [facility] to place referral  Pickup scheduled for Monday 8/15. Pt thanked CM for the assistance.    [facility] Manager  [facility]  O: [phone]  C: [phone]

## 2020-11-07 ENCOUNTER — Other Ambulatory Visit
Admission: RE | Admit: 2020-11-07 | Discharge: 2020-11-07 | Disposition: A | Discharge status: Home / Self Care | Payer: Medicaid (Managed Care) | Source: Ambulatory Visit

## 2020-11-07 ENCOUNTER — Ambulatory Visit: Payer: Medicaid (Managed Care) | Attending: Internal Medicine

## 2020-11-07 ENCOUNTER — Other Ambulatory Visit: Payer: Self-pay | Admitting: Student in an Organized Health Care Education/Training Program

## 2020-11-07 DIAGNOSIS — Z0000 Encounter for general adult medical examination without abnormal findings: Secondary | ICD-10-CM | POA: Insufficient documentation

## 2020-11-07 DIAGNOSIS — D500 Iron deficiency anemia secondary to blood loss (chronic): Secondary | ICD-10-CM | POA: Insufficient documentation

## 2020-11-07 DIAGNOSIS — D509 Iron deficiency anemia, unspecified: Secondary | ICD-10-CM | POA: Insufficient documentation

## 2020-11-07 DIAGNOSIS — Z111 Encounter for screening for respiratory tuberculosis: Secondary | ICD-10-CM

## 2020-11-07 DIAGNOSIS — R102 Pelvic and perineal pain: Secondary | ICD-10-CM | POA: Insufficient documentation

## 2020-11-07 DIAGNOSIS — Z23 Encounter for immunization: Secondary | ICD-10-CM | POA: Insufficient documentation

## 2020-11-07 LAB — CBC AND DIFFERENTIAL
Baso # K/uL: 0.1 10*3/uL (ref 0.0–0.1)
Basophil %: 0.7 %
Eos # K/uL: 0.1 10*3/uL (ref 0.0–0.4)
Eosinophil %: 1.2 %
Hematocrit: 29 % — ABNORMAL LOW (ref 34–45)
Hemoglobin: 7.8 g/dL — ABNORMAL LOW (ref 11.2–15.7)
IMM Granulocytes #: 0.1 10*3/uL — ABNORMAL HIGH (ref 0.0–0.0)
IMM Granulocytes: 0.4 %
Lymph # K/uL: 2.2 10*3/uL (ref 1.2–3.7)
Lymphocyte %: 18.6 %
MCH: 20 pg — ABNORMAL LOW (ref 26–32)
MCHC: 27 g/dL — ABNORMAL LOW (ref 32–36)
MCV: 75 fL — ABNORMAL LOW (ref 79–95)
Mono # K/uL: 1.1 10*3/uL — ABNORMAL HIGH (ref 0.2–0.9)
Monocyte %: 9.1 %
Neut # K/uL: 8.1 10*3/uL — ABNORMAL HIGH (ref 1.6–6.1)
Nucl RBC # K/uL: 0 10*3/uL (ref 0.0–0.0)
Nucl RBC %: 0.2 /100 WBC (ref 0.0–0.2)
Platelets: 444 10*3/uL — ABNORMAL HIGH (ref 160–370)
RBC: 3.8 MIL/uL — ABNORMAL LOW (ref 3.9–5.2)
RDW: 17.9 % — ABNORMAL HIGH (ref 11.7–14.4)
Seg Neut %: 70 %
WBC: 11.6 10*3/uL — ABNORMAL HIGH (ref 4.0–10.0)

## 2020-11-07 LAB — TYPE AND SCREEN
ABO RH Blood Type: A POS
Antibody Screen: NEGATIVE

## 2020-11-07 LAB — FERRITIN: Ferritin: 5 ng/mL — ABNORMAL LOW (ref 10–120)

## 2020-11-07 LAB — TIBC
Iron: 18 ug/dL — ABNORMAL LOW (ref 34–165)
TIBC: 579 ug/dL — ABNORMAL HIGH (ref 250–450)
Transferrin Saturation: 3 % — ABNORMAL LOW (ref 15–50)

## 2020-11-07 LAB — TRANSFERRIN: Transferrin: 429 mg/dL — ABNORMAL HIGH (ref 200–360)

## 2020-11-07 LAB — FOLATE: Folate: 11.1 ng/mL (ref >=4.6)

## 2020-11-07 LAB — NEUTROPHIL #-INSTRUMENT: Neutrophil #-Instrument: 8 10*3/uL

## 2020-11-07 LAB — MULTIPLE ORDERING DOCS

## 2020-11-07 LAB — VITAMIN B12: Vitamin B12: 630 pg/mL (ref 232–1245)

## 2020-11-07 NOTE — Progress Notes (Signed)
CM returned call to pt; call went to VM. CM left a brief message requesting a call back.    Vittoria Noreen  Health Home Care Manager  [facility]  O: [phone]  C: [phone]

## 2020-11-07 NOTE — Progress Notes (Signed)
Pt came into the office today for a PPD placement. Pt denied any hx of positive PPD's.  PPD placed via left forearm.  Pt aware that she needs to return to office in 48-72 hours for PPD reading     Booster dose of Pfizer COVID-19 vaccine administered in office in right deltoid muscle IM. Consent signed. Patient given vaccine information sheet. Patient tolerated vaccine and was advised to wait 15 minutes. No allergic reaction noted. --NOTE: patient decided to have vaccine in right arm after administer button had already been applied.     Titers ordered for patient regarding MMR

## 2020-11-08 LAB — SYPHILIS SCREEN
Syphilis Screen: NEGATIVE
Syphilis Status: NONREACTIVE

## 2020-11-08 LAB — HIV-1/2  ANTIGEN/ANTIBODY SCREEN WITH CONFIRMATION: HIV 1&2 ANTIGEN/ANTIBODY: NONREACTIVE

## 2020-11-09 LAB — MUMPS ANTIBODY, IGG: Mumps IgG: POSITIVE

## 2020-11-09 LAB — RUBELLA ANTIBODY, IGG: Rubella IgG AB: POSITIVE

## 2020-11-09 LAB — MEASLES IGG AB: Measles IgG: POSITIVE

## 2020-11-10 ENCOUNTER — Telehealth: Payer: Self-pay | Admitting: Internal Medicine

## 2020-11-10 ENCOUNTER — Other Ambulatory Visit: Payer: Self-pay

## 2020-11-10 DIAGNOSIS — Z5941 Food insecurity: Secondary | ICD-10-CM

## 2020-11-10 LAB — TB SKIN TEST: Induration:TB skin test: 0 mm

## 2020-11-10 NOTE — Telephone Encounter (Signed)
-----   Message from [name], MD sent at 11/07/2020  4:36 PM EDT -----  Hi Team,     Can you please reach out to schedule Jenna [name] for an appointment with a team resident or APP in the next 1-2 weeks? We need to go over her labwork and discuss the next steps for followup.    [name]

## 2020-11-10 NOTE — Telephone Encounter (Signed)
Writer called patient to get her scheduled for an appointment with our office. Patient requesting a call from a nurse to provide lab results.    Patient will also need transportation to 9/1 appointment with our office. Writer sending to [employer], Futures trader to assist as patient [place] in emergency housing [place].

## 2020-11-10 NOTE — Progress Notes (Signed)
Pt requesting help with food that she can easily bring back to her hotel room that her and her family are currently staying in. Pt also asking for cafeteria vouchers if possible for 1pm this afternoon.    [facility] Manager  [facility]  O: [phone]  C: [phone]

## 2020-11-11 NOTE — Telephone Encounter (Signed)
Call to patient.  Relayed message form Dr. Earlean Polka about need for IV iron infusion and that she will be able to reestablish those infusions at her oncology appointment on 9/9.  Writer emphasized the importance of attending    Patient vebralized understanding.    Patient aware she has appointment with Sherrilyn Rist, NP on Thursday.  Requesting transportation for an additional 5 people (6 total)

## 2020-11-12 ENCOUNTER — Encounter: Payer: Self-pay | Admitting: Gastroenterology

## 2020-11-13 ENCOUNTER — Other Ambulatory Visit: Payer: Self-pay

## 2020-11-13 ENCOUNTER — Ambulatory Visit: Payer: Medicaid (Managed Care) | Admitting: Adult Health

## 2020-11-13 ENCOUNTER — Encounter: Payer: Self-pay | Admitting: Adult Health

## 2020-11-13 VITALS — BP 139/80 | HR 77 | Temp 97.7°F | Ht 65.0 in

## 2020-11-13 DIAGNOSIS — I10 Essential (primary) hypertension: Secondary | ICD-10-CM

## 2020-11-13 DIAGNOSIS — R059 Cough, unspecified: Secondary | ICD-10-CM

## 2020-11-13 DIAGNOSIS — F17210 Nicotine dependence, cigarettes, uncomplicated: Secondary | ICD-10-CM

## 2020-11-13 DIAGNOSIS — D500 Iron deficiency anemia secondary to blood loss (chronic): Secondary | ICD-10-CM

## 2020-11-13 DIAGNOSIS — J4550 Severe persistent asthma, uncomplicated: Secondary | ICD-10-CM

## 2020-11-13 MED ORDER — GUAIFENESIN-DEXTROMETHORPHAN 100-10 MG/5 ML PO SYRP/LIQD WRAPPED *I*
5.0000 mL | ORAL_SOLUTION | Freq: Three times a day (TID) | ORAL | 0 refills | Status: DC | PRN
Start: 2020-11-13 — End: 2021-04-20
  Filled 2020-11-13 – 2020-12-23 (×2): qty 120, 8d supply, fill #0

## 2020-11-13 MED ORDER — ALBUTEROL SULFATE HFA 108 (90 BASE) MCG/ACT IN AERS *I*
1.0000 | INHALATION_SPRAY | Freq: Four times a day (QID) | RESPIRATORY_TRACT | 3 refills | Status: DC | PRN
Start: 2020-11-13 — End: 2021-02-03
  Filled 2020-11-13 – 2020-12-23 (×2): qty 18, 25d supply, fill #0

## 2020-11-13 MED ORDER — BUDESONIDE-FORMOTEROL FUMARATE 160-4.5 MCG/ACT IN AERO *I*
2.0000 | INHALATION_SPRAY | Freq: Two times a day (BID) | RESPIRATORY_TRACT | 5 refills | Status: DC
Start: 2020-11-13 — End: 2021-02-03
  Filled 2020-11-13 – 2020-12-23 (×2): qty 10.2, 30d supply, fill #0

## 2020-11-13 MED ORDER — NICOTINE 7 MG/24HR TD PT24 *I*
1.0000 | MEDICATED_PATCH | Freq: Every day | TRANSDERMAL | 3 refills | Status: DC
Start: 2020-11-13 — End: 2023-12-08
  Filled 2020-11-13 – 2020-12-23 (×2): qty 28, 28d supply, fill #0

## 2020-11-13 MED ORDER — AMLODIPINE BESYLATE 5 MG PO TABS *I*
5.0000 mg | ORAL_TABLET | Freq: Every day | ORAL | 1 refills | Status: DC
Start: 2020-11-13 — End: 2021-04-20
  Filled 2020-11-13 – 2020-12-23 (×2): qty 30, 30d supply, fill #0

## 2020-11-13 NOTE — Patient Instructions (Signed)
Call OBGYN to schedule your appointment [phone]
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
## 2020-11-14 ENCOUNTER — Other Ambulatory Visit: Payer: Self-pay | Admitting: Oncology

## 2020-11-18 ENCOUNTER — Other Ambulatory Visit: Payer: Self-pay

## 2020-11-18 ENCOUNTER — Ambulatory Visit: Payer: 59 | Admitting: Rehabilitative and Restorative Service Providers"

## 2020-11-18 NOTE — Progress Notes (Signed)
[facility] SOCIAL WORK:   [facility] Social Work Chief Executive Officer:     Patient referred based on distress screen?: No      Social Work contact made?: No      Social Work Assessment:  Educational psychologist with:  Surveyor, quantity    Social Work post assessment plan:  As needed follow-up and Referral  Referral:     Other referral::  [facility] Manager  [facility]  O: [phone]  C: [phone]    Time spent on this encounter (in minutes):  10    Sw received a message from provider team indicating that patient needs assistance with transport to upcoming appts.     Sw did chart review and appears she has been set up with [facility] Corrine who has been working very closely with her with regards to her needs as well as transportation.     Writer did have contact with patient back in February of this year and assisted with transportation and assessment of needs in the home. Pt denied urgent needs at that time.     Writer placed call and spoke with [name] Corrine who states that she does work closely with patient and family. Patient and family  are currently stating in [facility] inn in [place] [phone], [place]) at this time while seeing alternative housing. Corrine has been assisting with connecting them with food boxes and household essentials for the family also. Corrine did set the transport for 9/9 appt and can assist with the others too as to not complicate who patients main contact for transport is and since her housing may change she will be more in the loop with that.     Writer did provide Corrine with my direct contact info should she need/want my assistance with anything. Encouraged her to reach out as needed.     Sw updated team.

## 2020-11-19 ENCOUNTER — Other Ambulatory Visit: Payer: Self-pay

## 2020-11-19 ENCOUNTER — Ambulatory Visit (INDEPENDENT_AMBULATORY_CARE_PROVIDER_SITE_OTHER): Payer: 59 | Admitting: Rehabilitative and Restorative Service Providers"

## 2020-11-19 ENCOUNTER — Encounter: Payer: Self-pay | Admitting: Rehabilitative and Restorative Service Providers"

## 2020-11-19 DIAGNOSIS — M25562 Pain in left knee: Secondary | ICD-10-CM

## 2020-11-19 DIAGNOSIS — M25561 Pain in right knee: Secondary | ICD-10-CM

## 2020-11-19 DIAGNOSIS — R262 Difficulty in walking, not elsewhere classified: Secondary | ICD-10-CM | POA: Diagnosis not present

## 2020-11-19 DIAGNOSIS — M6281 Muscle weakness (generalized): Secondary | ICD-10-CM | POA: Diagnosis not present

## 2020-11-19 DIAGNOSIS — G8929 Other chronic pain: Secondary | ICD-10-CM

## 2020-11-19 NOTE — Patient Instructions (Signed)
Access Code: [email or web address] URL: [email or web address] Date: 11/19/2020 Prepared by: Chyrel Masson  Exercises Supine Bridge - 2 x daily - 7 x weekly - 3 sets - 10 reps - 2 hold Figure 4 Bridge - 2 x daily - 7 x weekly - 3 sets - 10 reps Small Range Straight Leg Raise - 2 x daily - 7 x weekly - 3 sets - 10 reps Seated Straight Leg Heel Taps - 1-2 x daily - 7 x weekly - 3 sets - 10 reps Wall Squat - 1 x daily - 7 x weekly - 1 sets - 5 reps - to fatigue hold
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
## 2020-11-19 NOTE — Progress Notes (Signed)
Spoke with patient.       Patient reports:   -Abdominal pain near umbilical  -Painful to touch, can feel a "knot"   -Duration:2 days  -Productive Cough, patient doesn't believe it is related  -SOB;patient states hasn't had a transfusion recently  -Patient is lactating  -Fatigue    Patient denies:   -constipation  -Nausea  -Vomiting  -diarrhea      Advised patient to be seen in office on 9/12 and patient agrees with plan. Attempted to make sooner appts ,however patient preferred afternoon appts and has an infusion on Friday 9/9. Pt will need transportation set up.       Advised pt to call in the meantime with questions or concerns, or to seek care with worsening symptoms. Patient verbalized understanding.

## 2020-11-19 NOTE — Progress Notes (Signed)
CM returned call to pt; she is feeling a pinching in her abdomen next to her belly button and would like to schedule an appt to be seen. Pt also states that she is lactating even though she is not pregnant.     CM obtained household supplies and baby care needs for pt and will deliver today.    [facility] Manager  [facility]  O: [phone]  C: [phone]

## 2020-11-20 ENCOUNTER — Telehealth: Payer: Self-pay | Admitting: Internal Medicine
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
## 2020-11-20 NOTE — Telephone Encounter (Signed)
Luan Moore, RN  Sim Mahler Pss 19 hours ago (4:40 PM)     [place]    Patient has been scheduled for appt on 9/12 at 11:20AM, do you mind setting up [facility] transport?     Thank you!   Chrissy,RN    Message text          9/12 Trip Confirmation  Tenasia Aull is scheduled for a Primary Care Office appointment on 11/24/2020 at 11:15am.  At 10:15am [facility] will pick up [name] at [address], [address] and take them to [address] [place], [place]  At 12:45pm [facility] will pick up [name] at [address], [place] and take them to [address], [address]  Addisen Chappelle may contact [facility] at [phone] for changes to pick-up and return times prior to this trip.      Loveta Porada's Trip  Date: 11/24/2020 Time: 11:15am Inv# [id number]

## 2020-11-21 ENCOUNTER — Telehealth: Payer: Self-pay | Admitting: Internal Medicine

## 2020-11-21 ENCOUNTER — Ambulatory Visit: Payer: Medicaid (Managed Care) | Attending: Hematology & Oncology | Admitting: Hematology & Oncology

## 2020-11-21 ENCOUNTER — Ambulatory Visit: Payer: Medicaid (Managed Care) | Admitting: Adult Health

## 2020-11-21 VITALS — BP 143/64 | HR 94 | Temp 98.0°F | Resp 18

## 2020-11-21 DIAGNOSIS — E669 Obesity, unspecified: Secondary | ICD-10-CM | POA: Insufficient documentation

## 2020-11-21 DIAGNOSIS — N920 Excessive and frequent menstruation with regular cycle: Secondary | ICD-10-CM | POA: Insufficient documentation

## 2020-11-21 DIAGNOSIS — D500 Iron deficiency anemia secondary to blood loss (chronic): Secondary | ICD-10-CM | POA: Insufficient documentation

## 2020-11-21 NOTE — Patient Instructions (Signed)
You were seen by your hemagologist, Dr. Nonie Hoyer, today     Your secretary is: Marcie Bal  Your nurse is: Oneita Kras, RN   Your doctor is: Dr. Nonie Hoyer    The office phone number is  [phone]  This phone will be answered by a secretary who will take your message Monday - Friday, 8:00 am - 5:00 pm.   Please be prepared to let the secretary know why you are calling.  This will help let your nurse know how urgent the call is.      This is also the same number that you should call after hours for any urgent needs.  There is always a fellow on call.      Please do not use My Chart for urgent needs       You should call 911 for any medical emergencies including:  Chest pain, shortness of breath, or trouble breathing, a severe headache that does not go away, feeling confused, or having trouble seeing, or bleeding that does not stop or slow down after several minutes.     Please have any labs drawn within a few days of any follow up appointments.  You may also come an hour early to your appointment and have your labs drawn in the [facility] lab.    To send messages and to view your labs, please sign up for MyChart at: [email or web address], Ph: [phone], x2

## 2020-11-21 NOTE — Telephone Encounter (Signed)
Copied from CRM #[id number]. Topic: Access to Care - Speak to Provider/Office Staff  >> Nov 21, 2020  9:21 AM Braulio Kiedrowski, Aram Beecham wrote:  Patient calling in to confirm time of pick up for transportation for her infusion per encounter appt scheduling on 08/29 transportation was set up but Clinical research associate did not see infusion on her scheduled appointment. For today she has a 6 month follow up. Patient wants to be sure shes getting her infusion today and what time the cab will be there. Patient can be reached at [phone]

## 2020-11-23 ENCOUNTER — Other Ambulatory Visit: Payer: Self-pay

## 2020-11-24 ENCOUNTER — Ambulatory Visit: Payer: Medicaid (Managed Care) | Admitting: Student in an Organized Health Care Education/Training Program

## 2020-11-24 ENCOUNTER — Other Ambulatory Visit: Payer: Self-pay
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
## 2020-11-24 NOTE — Progress Notes (Signed)
Maurie Musco is scheduled for a OB/GYN appointment on 12/12/2020 at 3:00pm.  At 2:00pm [facility] will pick up [name] at [address], [place] and take them to [address], [place]  At 4:30pm [facility] will pick up [name] at [employer], [place] and take them to [phone], Merlie Noga may contact [facility] at [phone] for changes to pick-up and return times prior to this trip.  Inv# [id number]    [facility] Manager  [facility]  O: [phone]  C: [phone]

## 2020-11-26 ENCOUNTER — Encounter: Payer: Self-pay | Admitting: Gastroenterology

## 2020-11-26 NOTE — Progress Notes (Signed)
Pt called CM as she is having issues with food and baby supplies. CM called [facility] and left a message requesting help with food. CM also called [facility] and requested help with baby supplies.     CM also spoke with Valli Glance, SW at [place] regarding pt. CM to arrange for transportation to pt's infusions as CM has been arranging [facility] trips.    [facility] Manager  [facility]  O: [phone]  C: [phone]

## 2020-11-27 ENCOUNTER — Encounter: Payer: Self-pay | Admitting: Gastroenterology

## 2020-12-01 ENCOUNTER — Telehealth: Payer: Self-pay

## 2020-12-01 DIAGNOSIS — D500 Iron deficiency anemia secondary to blood loss (chronic): Secondary | ICD-10-CM

## 2020-12-01 NOTE — Progress Notes (Signed)
WCI SOCIAL WORK:   [facility] Social Work Chief Executive Officer:     Patient referred based on distress screen?: No      Social Work contact made?: Yes      Method of contact:  Telephone    Social Work Assessment:  Barriers to Care    Barriers to care:  Transportation    Social Work post assessment plan:  Referral  Referral:     Transportation:  [employer]                     MAS online portal    Time spent on this encounter (in minutes):  5    Sw received a call from [employer] asking for assistance with getting patient to labs for upcoming appt. Sw happy to assist. Below trip was made and communicated to patient through RN Suzie.     12/01/20- Jenna [name] is scheduled for a Lab Tests appointment on 12/01/2020 at 2:00pm.  At 1:00pm [facility] will pick up Jenna [name] at [address], [place] and take them to [address] [place]  At 3:30pm [facility] will pick up Jenna [name] at [address], [place] and take them to [address], [facility]  [name] may contact [facility] at [phone] for changes to pick-up and return times prior to this trip.  Inv# [phone]    Jenna [name], LMSW  10:05 AM  12/01/2020  [facility]  Oncology Social Worker  Ph- [phone]

## 2020-12-01 NOTE — Telephone Encounter (Signed)
Spoke to patient Scientific laboratory technician informed her that we have her set up for a blood transfusion here at [facility] on Thursday 12/04/20 at 10:30 am. She will need to have a type and screen done prior and would like to try to get to the lab today for that. Writer will contact Georgiann Cocker to see if she can help arrange that transportation. Patient is hoping that she can go around 12:30/1:00 today. She also has venofer weekly x 5 already set up to begin on 12/12/20. Patient thanked Clinical research associate for call back, verbalized understanding and is agreeable to plan.

## 2020-12-01 NOTE — Addendum Note (Signed)
Addended by: Sheilah Mins on: 12/01/2020 10:02 AM     Modules accepted: Orders

## 2020-12-03 ENCOUNTER — Other Ambulatory Visit
Admission: RE | Admit: 2020-12-03 | Discharge: 2020-12-03 | Disposition: A | Discharge status: Home / Self Care | Payer: Medicaid (Managed Care) | Source: Ambulatory Visit | Attending: Oncology | Admitting: Oncology

## 2020-12-03 DIAGNOSIS — D649 Anemia, unspecified: Secondary | ICD-10-CM | POA: Insufficient documentation

## 2020-12-03 DIAGNOSIS — D500 Iron deficiency anemia secondary to blood loss (chronic): Secondary | ICD-10-CM | POA: Insufficient documentation

## 2020-12-03 LAB — CBC AND DIFFERENTIAL
Baso # K/uL: 0.1 10*3/uL (ref 0.0–0.1)
Basophil %: 0.5 %
Eos # K/uL: 0.1 10*3/uL (ref 0.0–0.4)
Eosinophil %: 1.1 %
Hematocrit: 28 % — ABNORMAL LOW (ref 34–45)
Hemoglobin: 7.5 g/dL — ABNORMAL LOW (ref 11.2–15.7)
IMM Granulocytes #: 0 10*3/uL (ref 0.0–0.0)
IMM Granulocytes: 0.3 %
Lymph # K/uL: 2.2 10*3/uL (ref 1.2–3.7)
Lymphocyte %: 17.7 %
MCH: 20 pg — ABNORMAL LOW (ref 26–32)
MCHC: 27 g/dL — ABNORMAL LOW (ref 32–36)
MCV: 75 fL — ABNORMAL LOW (ref 79–95)
Mono # K/uL: 0.9 10*3/uL (ref 0.2–0.9)
Monocyte %: 7.3 %
Neut # K/uL: 9 10*3/uL — ABNORMAL HIGH (ref 1.6–6.1)
Nucl RBC # K/uL: 0 10*3/uL (ref 0.0–0.0)
Nucl RBC %: 0 /100 WBC (ref 0.0–0.2)
Platelets: 468 10*3/uL — ABNORMAL HIGH (ref 160–370)
RBC: 3.8 MIL/uL — ABNORMAL LOW (ref 3.9–5.2)
RDW: 17.4 % — ABNORMAL HIGH (ref 11.7–14.4)
Seg Neut %: 73.1 %
WBC: 12.3 10*3/uL — ABNORMAL HIGH (ref 4.0–10.0)

## 2020-12-03 LAB — TYPE AND SCREEN
ABO RH Blood Type: A POS
Antibody Screen: NEGATIVE

## 2020-12-03 LAB — MULTIPLE ORDERING DOCS

## 2020-12-03 LAB — NEUTROPHIL #-INSTRUMENT: Neutrophil #-Instrument: 9 10*3/uL

## 2020-12-04 ENCOUNTER — Ambulatory Visit: Payer: Medicaid (Managed Care) | Attending: Oncology

## 2020-12-04 VITALS — BP 140/86 | HR 78 | Temp 96.8°F | Resp 16

## 2020-12-04 DIAGNOSIS — D500 Iron deficiency anemia secondary to blood loss (chronic): Secondary | ICD-10-CM | POA: Insufficient documentation

## 2020-12-04 DIAGNOSIS — D649 Anemia, unspecified: Secondary | ICD-10-CM

## 2020-12-04 MED ORDER — SODIUM CHLORIDE 0.9 % IV SOLN WRAPPED *I*
30.0000 mL/h | Status: DC | PRN
Start: 2020-12-04 — End: 2020-12-04
  Administered 2020-12-04: 30 mL/h via INTRAVENOUS

## 2020-12-04 MED ORDER — ACETAMINOPHEN 325 MG PO TABS *I*
650.0000 mg | ORAL_TABLET | Freq: Once | ORAL | Status: AC
Start: 2020-12-04 — End: 2020-12-04
  Administered 2020-12-04: 650 mg via ORAL
  Filled 2020-12-04: qty 2

## 2020-12-04 MED ORDER — MEDS & ORDERS EXIST IN BLOOD ADMIN PLAN *I*
Status: DC
Start: 2020-12-04 — End: 2023-12-08

## 2020-12-04 NOTE — Progress Notes (Signed)
Patient arrived to infusion in stable condition. Labs and consent reviewed. Patient does not meet parameters for blood transfusion based off of labs, but writer spoke to clinic nurse who confirmed transfusion approved based off of patients symptoms of extreme lethargy and dyspnea with exertion. Patient with questions regarding housing and food, patient reached out to Johnsie Kindred, MSW, for further guidance. Awaiting call back. Pre-meds given per order and patient tolerated 1 unit of blood via PIV, without complaint. VSS throughout infusion and positive blood return from PIV pre and post infusion. Patient denied questions prior to discharge and was encouraged to reach out to clinic team with future questions or concerns. Patient verbalized understanding and was discharged in stable condition.    Patient complied with masking policy throughout duration of appointment. Writer maintained use of mask and faceshield/eyewear throughout duration of appointment. Writer was within 6 feet of patient for > 15 minutes due to the nature of the visit.

## 2020-12-04 NOTE — Progress Notes (Signed)
BMT SOCIAL WORK NOTE    Contacts:  Medical Answering Service [phone]             Denisse Whitenack [phone]       Ms. Silvey needs transportation for appointment on 9/22.  [facility] was contacted and transportation has been arranged for the appointment.  The patient is aware of the arrangements; and does not have any other needs at this time.    WCI SOCIAL WORK:   [facility] Social Work Chief Executive Officer:     Patient referred based on distress screen?: No      Social Work contact made?: Yes      Method of contact:  Telephone    Social Work Assessment:  Barriers to Care    Barriers to care:  Transportation    Social Work post assessment plan:  As needed follow-up and Referral  Referral:     Transportation:  [facility]    Time spent on this encounter (in minutes):  15     Frances Nickels, LMSW  [phone]

## 2020-12-05 ENCOUNTER — Other Ambulatory Visit: Payer: Self-pay

## 2020-12-05 ENCOUNTER — Encounter: Payer: Self-pay | Admitting: Rehabilitative and Restorative Service Providers"

## 2020-12-05 ENCOUNTER — Ambulatory Visit (INDEPENDENT_AMBULATORY_CARE_PROVIDER_SITE_OTHER): Payer: 59 | Admitting: Rehabilitative and Restorative Service Providers"

## 2020-12-05 DIAGNOSIS — M25561 Pain in right knee: Secondary | ICD-10-CM | POA: Diagnosis not present

## 2020-12-05 DIAGNOSIS — M6281 Muscle weakness (generalized): Secondary | ICD-10-CM

## 2020-12-05 DIAGNOSIS — M25562 Pain in left knee: Secondary | ICD-10-CM

## 2020-12-05 DIAGNOSIS — R262 Difficulty in walking, not elsewhere classified: Secondary | ICD-10-CM | POA: Diagnosis not present

## 2020-12-05 DIAGNOSIS — G8929 Other chronic pain: Secondary | ICD-10-CM

## 2020-12-05 LAB — RED BLOOD CELLS
Coded Blood type: 6200
Component blood type: A POS
Dispense status: TRANSFUSED

## 2020-12-05 NOTE — Patient Instructions (Signed)
Access Code: [email or web address] URL: [email or web address] Date: 12/05/2020 Prepared by: Chyrel Masson  Exercises Supine Bridge - 2 x daily - 7 x weekly - 3 sets - 10 reps - 2 hold Figure 4 Bridge - 2 x daily - 7 x weekly - 3 sets - 10 reps Small Range Straight Leg Raise - 2 x daily - 7 x weekly - 3 sets - 10 reps Seated Straight Leg Heel Taps - 1-2 x daily - 7 x weekly - 3 sets - 10 reps Wall Squat - 1 x daily - 7 x weekly - 1 sets - 5 reps - to fatigue hold Prone Terminal Knee Extension - 2 x daily - 7 x weekly - 1 sets - 10 reps - 5-10 hold Lateral Step Down - 1 x daily - 7 x weekly - 3 sets - 10 reps

## 2020-12-05 NOTE — Therapy (Addendum)
[facility] Physical Therapy [address] [place], [place], [address] Phone: [phone]   Fax:  [phone]  Physical Therapy Treatment  Patient Details  Name: Samantha Nguyen MRN: [id number] Date of Birth: [birth year] Referring Provider (PT): Aundra Dubin, PA-C  PHYSICAL THERAPY DISCHARGE SUMMARY  Visits from Start of Care: 2  Current functional level related to goals / functional outcomes: See note   Remaining deficits: See note   Education / Equipment: HEP   Patient agrees to discharge. Patient goals were not met. Patient is being discharged due to not returning since the last visit.  Encounter Date: 12/05/2020   PT End of Session - 12/05/20 1317     Visit Number 2    Number of Visits 20    Date for PT Re-Evaluation 01/28/21    Authorization Type [employer] $20 copay    Progress Note Due on Visit 10    PT Start Time 1317    PT Stop Time 1355    PT Time Calculation (min) 38 min    Activity Tolerance Patient tolerated treatment well    Behavior During Therapy WFL for tasks assessed/performed             Past Medical History:  Diagnosis Date   Anxiety    Depressed    History of kidney stones    Migraine     Past Surgical History:  Procedure Laterality Date   CESAREAN SECTION     CYSTOSCOPY W/ URETERAL STENT PLACEMENT Right 08/24/2016   Procedure: CYSTOSCOPY WITH RETROGRADE PYELOGRAM/URETERAL STENT PLACEMENT;  Surgeon: Cleon Gustin, MD;  Location: [facility];  Service: Urology;  Laterality: Right;   CYSTOSCOPY WITH RETROGRADE PYELOGRAM, URETEROSCOPY AND STENT PLACEMENT Right 09/09/2016   Procedure: CYSTOSCOPY WITH RETROGRADE PYELOGRAM, URETEROSCOPY AND STENT REPLACEMENT;  Surgeon: Cleon Gustin, MD;  Location: [facility];  Service: Urology;  Laterality: Right;   MANDIBLE SURGERY      There were no vitals filed for this visit.   Subjective Assessment - 12/05/20 1321     Subjective Pt. indicated ache low  upon arrival today (3-4/10).  Pt. indicated no pain from exercise performance.  Pt. reported incident of "Lt knee popping out." that was described as patellar subluxation.  Resolved c knee extension positioning.    Diagnostic tests xrays on knees    Patient Stated Goals Reduce pain    Currently in Pain? Yes    Pain Score 3     Pain Location Knee    Pain Orientation Left;Right    Pain Descriptors / Indicators Aching;Dull    Pain Type Chronic pain    Pain Onset More than a month ago    Pain Frequency Intermittent    Aggravating Factors  walking, standing, spontaneous subluxation description    Pain Relieving Factors leg straight helped reduced subluxation report.                [facility] PT Assessment - 12/05/20 0001       Assessment   Medical Diagnosis M22.2X1,M22.2X2 (ICD-10-CM) - Patellofemoral syndrome, bilateral    Referring Provider (PT) Aundra Dubin, PA-C    Onset Date/Surgical Date 10/22/20      Posture/Postural Control   Posture Comments Patella visually appears to be superior to normal (did not measure for true patella alta positioning)      Strength   Right Knee Extension 4/5    Left Knee Extension 4/5  [facility] Physical Therapy [address] [place], [place], [address] Phone: [phone]   Fax:  [phone]  Physical Therapy Treatment  Patient Details  Name: Samantha Nguyen MRN: [id number] Date of Birth: [birth year] Referring Provider (PT): Aundra Dubin, PA-C  PHYSICAL THERAPY DISCHARGE SUMMARY  Visits from Start of Care: 2  Current functional level related to goals / functional outcomes: See note   Remaining deficits: See note   Education / Equipment: HEP   Patient agrees to discharge. Patient goals were not met. Patient is being discharged due to not returning since the last visit.  Encounter Date: 12/05/2020   PT End of Session - 12/05/20 1317     Visit Number 2    Number of Visits 20    Date for PT Re-Evaluation 01/28/21    Authorization Type [employer] $20 copay    Progress Note Due on Visit 10    PT Start Time 1317    PT Stop Time 1355    PT Time Calculation (min) 38 min    Activity Tolerance Patient tolerated treatment well    Behavior During Therapy WFL for tasks assessed/performed             Past Medical History:  Diagnosis Date   Anxiety    Depressed    History of kidney stones    Migraine     Past Surgical History:  Procedure Laterality Date   CESAREAN SECTION     CYSTOSCOPY W/ URETERAL STENT PLACEMENT Right 08/24/2016   Procedure: CYSTOSCOPY WITH RETROGRADE PYELOGRAM/URETERAL STENT PLACEMENT;  Surgeon: Cleon Gustin, MD;  Location: [facility];  Service: Urology;  Laterality: Right;   CYSTOSCOPY WITH RETROGRADE PYELOGRAM, URETEROSCOPY AND STENT PLACEMENT Right 09/09/2016   Procedure: CYSTOSCOPY WITH RETROGRADE PYELOGRAM, URETEROSCOPY AND STENT REPLACEMENT;  Surgeon: Cleon Gustin, MD;  Location: [facility];  Service: Urology;  Laterality: Right;   MANDIBLE SURGERY      There were no vitals filed for this visit.   Subjective Assessment - 12/05/20 1321     Subjective Pt. indicated ache low  upon arrival today (3-4/10).  Pt. indicated no pain from exercise performance.  Pt. reported incident of "Lt knee popping out." that was described as patellar subluxation.  Resolved c knee extension positioning.    Diagnostic tests xrays on knees    Patient Stated Goals Reduce pain    Currently in Pain? Yes    Pain Score 3     Pain Location Knee    Pain Orientation Left;Right    Pain Descriptors / Indicators Aching;Dull    Pain Type Chronic pain    Pain Onset More than a month ago    Pain Frequency Intermittent    Aggravating Factors  walking, standing, spontaneous subluxation description    Pain Relieving Factors leg straight helped reduced subluxation report.                [facility] PT Assessment - 12/05/20 0001       Assessment   Medical Diagnosis M22.2X1,M22.2X2 (ICD-10-CM) - Patellofemoral syndrome, bilateral    Referring Provider (PT) Aundra Dubin, PA-C    Onset Date/Surgical Date 10/22/20      Posture/Postural Control   Posture Comments Patella visually appears to be superior to normal (did not measure for true patella alta positioning)      Strength   Right Knee Extension 4/5    Left Knee Extension 4/5  PT Treatment/Interventions ADLs/Self Care Home Management;Cryotherapy;Electrical Stimulation;Iontophoresis 70m/ml Dexamethasone;Moist Heat;Balance training;Therapeutic exercise;Therapeutic activities;Functional mobility training;Stair training;Gait training;DME Instruction;Ultrasound;Neuromuscular re-education;Patient/family education;Passive range of motion;Spinal Manipulations;Joint Manipulations;Dry needling;Taping;Manual techniques    PT Next Visit Plan Eccentric loading strengthening in full range with symptom modifications if necessary, dynamic stability.    PT Home Exercise Plan [email or web address]    Consulted and Agree with Plan of Care Patient             Patient will benefit from skilled therapeutic intervention in order to improve the following deficits and impairments:  Pain, Decreased strength, Decreased activity tolerance, Decreased mobility, Decreased balance, Decreased range of motion, Impaired perceived functional ability, Improper body mechanics, Decreased coordination, Difficulty walking, Decreased endurance  Visit Diagnosis: Chronic pain of left knee  Chronic pain of right knee  Muscle weakness (generalized)  Difficulty in walking, not elsewhere classified     Problem List Patient Active Problem List   Diagnosis Date Noted   S[place]is due to urinary tract infection ([place] 08/24/2016    MScot Jun PT, DPT, OCS, ATC 12/05/20  1:58 PM  RFarley LyPT, MPT  [facility] [address] [address]Phone: [phone]  Fax:  [phone] Name: Samantha BrierMRN: [id number] of Birth: 909/15/1987

## 2020-12-10 ENCOUNTER — Other Ambulatory Visit: Payer: Self-pay

## 2020-12-10 NOTE — Progress Notes (Signed)
Jenna [name] is scheduled for a OB/GYN appointment on 12/19/2020 at 2:30pm.  At 1:30pm [facility] will pick up [name] at [address], [place] and take them to [address], [place]  At 4:00pm [facility] will pick up [name] at [employer], [place] and take them to [address], [name] may contact [facility] at [phone] for changes to pick-up and return times prior to this trip.  Inv# [id number]    Jenna [name] is scheduled for a OB/GYN appointment on 12/26/2020 at 3:00pm.  At 2:00pm [facility] will pick up [name] at [address], [place] and take them to [address], [place]  At 4:30pm [facility] will pick up [name] at [employer], [place] and take them to [address], [name] may contact [facility] at [phone] for changes to pick-up and return times prior to this trip.  Inv# [id number]    Jenna [name] is scheduled for a OB/GYN appointment on 01/02/2021 at 2:30pm.  At 1:30pm [facility] will pick up [name] at [address], [place] and take them to [address], [name]  At 4:00pm [facility] will pick up [name] at [employer], [place] and take them to [address], [name] may contact [facility] at [phone] for changes to pick-up and return times prior to this trip.  Inv# [id number]    Jenna [name] is scheduled for a OB/GYN appointment on 01/09/2021 at 2:30pm.  At 1:30pm [facility] will pick up [name] at [address], [place] and take them to [address],   At 4:00pm [facility] will pick up [name] at [employer], [place] and take them to [address], [name] may contact [facility] at [phone] for changes to pick-up and  return times prior to this trip.  Inv# [id number]    [facility] Manager  [facility]  O: [phone]  C: [phone]

## 2020-12-11 ENCOUNTER — Other Ambulatory Visit: Payer: Self-pay

## 2020-12-12 ENCOUNTER — Ambulatory Visit: Payer: Medicaid (Managed Care)

## 2020-12-12 ENCOUNTER — Telehealth: Payer: Self-pay | Admitting: Hematology & Oncology

## 2020-12-12 ENCOUNTER — Encounter: Payer: 59 | Admitting: Rehabilitative and Restorative Service Providers"

## 2020-12-12 MED FILL — Iron Sucrose Inj 20 MG/ML (Fe Equiv): INTRAVENOUS | Qty: 10 | Status: CN

## 2020-12-12 NOTE — Telephone Encounter (Signed)
Patient needs to reschedule her appointment today. Please call to reschedule.

## 2020-12-15 ENCOUNTER — Other Ambulatory Visit: Payer: Self-pay

## 2020-12-15 NOTE — Progress Notes (Signed)
WCI SOCIAL WORK:   [facility] Social Work Chief Executive Officer:     Patient referred based on distress screen?: No      Social Work contact made?: Yes      Method of contact:  Telephone    Social Work Assessment:  Barriers to Care    Barriers to care:  Transportation    Social Work post assessment plan:  Referral  Referral:     Transportation:  [facility]                     MAS online portal    Time spent on this encounter (in minutes):  10      Sw was made aware by provider team that 9/30 appt was cancelled and rescheduled to 01/16/21 @ 2:30pm. Patient will need assistance with transportation. Writer happy to assist. Below trip was scheduled and confirmed details with patients SO over the phone. Writer also reviewed the other dates for October as well. He is aware and wrote them on calendar.     01/16/21- Jenna [name] is scheduled for a Hematology/Blood appointment on 01/16/2021 at 2:30pm.  At 1:30pm [facility] will pick up Jenna [name] at [address], [place] and take them to [address] [employer], [place]  At 4:30pm [facility] will pick up Jenna [name] at [employer], [place] and take them to [address], [address]  Jenna [name] may contact [facility] at [phone] for changes to pick-up and return times prior to this trip.  Inv# [id number]    Jenna [name], LMSW  4:11 PM  12/15/2020  [facility]  Oncology Social Worker  Ph- [phone]

## 2020-12-17 ENCOUNTER — Encounter: Payer: Self-pay | Admitting: Gastroenterology

## 2020-12-17 NOTE — Progress Notes (Signed)
CM submitted delivery referral for the [employer] per pt's request.     [facility] Manager  [facility]  O: [phone]  C: [phone]

## 2020-12-18 MED FILL — Iron Sucrose Inj 20 MG/ML (Fe Equiv): INTRAVENOUS | Qty: 10 | Status: AC

## 2020-12-19 ENCOUNTER — Telehealth: Payer: Self-pay | Admitting: Hematology & Oncology

## 2020-12-19 ENCOUNTER — Ambulatory Visit: Payer: Medicaid (Managed Care)

## 2020-12-22 ENCOUNTER — Other Ambulatory Visit: Payer: Self-pay

## 2020-12-22 NOTE — Progress Notes (Signed)
Jenna [name] is scheduled for a Lab Tests appointment on 12/23/2020 at 4:30pm.  At 3:30pm [facility] will pick up Jenna [name] at [address], Jenna [name] and take them to [address] Jenna [name]  At 6:00pm [facility] will pick up Jenna [name] at [address], Jenna [name] and take them to [address], [name] may contact [facility] at [phone] for changes to pick-up and return times prior to this trip.  Inv# [id number]      [facility] Manager  [facility]  O: [phone]  C: [phone]

## 2020-12-22 NOTE — Progress Notes (Signed)
.  WCI SOCIAL WORK:   [facility] Social Work Chief Executive Officer:     Patient referred based on distress screen?: No      Social Work contact made?: Yes      Method of contact:  Telephone    Social Work Assessment:  Educational psychologist with:  Surveyor, quantity    Social Work post assessment plan:  As needed follow-up and Referral  Referral:     Air cabin crew:  Other    Fish farm manager: Building control surveyor a Family program- Lauro Franklin Office: [phone] ext. [phone]      email: [email or web address]      Other referral::  [facility]- Corrine Sandholzer - [phone]    Time spent on this encounter (in minutes):  15      Sw was alerted by patients [facility] Corrine that patient had reached out to her to say that she was seeking some clothing assistance for her youngest daughter and that someone in blood clinic had told her they could help and get her some things for 10/14 appt.     Writer was unaware of this but after speaking with clinic staff it appears one of the clinic staff had some items personally she was wanting to give to pt and would bring for pt at next visit.     Writer was unaware of these needs/concerns. Writer placed call and spoke with Sacheen who confirmed clothing insecurities along with food and housing. [facility] is aware of housing needs and limited income, but was unaware of clothing need, and will work with her on these things. Writer offered to make a referral for her to [employer] adopt a family holiday program which will help with Xmas items for her entire family. Larrissa is accepting of a referral and expressed appreciation for information about it and for thinking of her family.  Writer placed referral to Lauro Franklin at [employer] via [employer]. [facility] release completed).     Sw to follow as needed and asked [facility] to keep writer informed if there are other resources I can assist with.     Brantley Persons, LMSW  11:19 AM  12/22/2020  [facility]  Oncology Social  Worker  Ph- [phone]

## 2020-12-22 NOTE — Progress Notes (Signed)
CM called [facility] and scheduled food pickup for the following day. Pt notified.    [facility] Manager  [facility]  O: [phone]  C: [phone]

## 2020-12-23 ENCOUNTER — Other Ambulatory Visit: Payer: Self-pay

## 2020-12-24 ENCOUNTER — Other Ambulatory Visit: Payer: Self-pay

## 2020-12-25 MED FILL — Iron Sucrose Inj 20 MG/ML (Fe Equiv): INTRAVENOUS | Qty: 10 | Status: AC

## 2020-12-26 ENCOUNTER — Ambulatory Visit: Payer: Medicaid (Managed Care)

## 2020-12-26 ENCOUNTER — Encounter: Payer: 59 | Admitting: Rehabilitative and Restorative Service Providers"

## 2020-12-31 ENCOUNTER — Other Ambulatory Visit: Payer: Self-pay

## 2021-01-01 MED FILL — Iron Sucrose Inj 20 MG/ML (Fe Equiv): INTRAVENOUS | Qty: 10 | Status: AC

## 2021-01-02 ENCOUNTER — Other Ambulatory Visit: Payer: Self-pay

## 2021-01-02 ENCOUNTER — Ambulatory Visit: Payer: Medicaid (Managed Care)

## 2021-01-02 ENCOUNTER — Telehealth: Payer: Self-pay | Admitting: Rehabilitative and Restorative Service Providers"

## 2021-01-02 ENCOUNTER — Encounter: Payer: 59 | Admitting: Rehabilitative and Restorative Service Providers"

## 2021-01-02 NOTE — Telephone Encounter (Signed)
Called after 20 mins no show for appointment.  Pt. Indicated she has been busy with school and forgot about today's appointment.  No more scheduled at this time.   Chyrel Masson, PT, DPT, OCS, ATC 01/02/21  11:22 AM

## 2021-01-05 ENCOUNTER — Other Ambulatory Visit: Payer: Self-pay

## 2021-01-06 ENCOUNTER — Other Ambulatory Visit: Payer: Self-pay

## 2021-01-07 ENCOUNTER — Encounter: Payer: Self-pay | Admitting: Gastroenterology

## 2021-01-07 NOTE — Progress Notes (Signed)
Pt contacted CM to request help with a food delivery to her hotel room. CM contacted the food pantry in [facility] and requested a referral; the pantry would be able to drop off to pt the following day. Pt was notified.    [facility] Manager  [facility]  O: [phone]  C: [phone]

## 2021-01-09 ENCOUNTER — Ambulatory Visit: Payer: Medicaid (Managed Care)

## 2021-01-12 ENCOUNTER — Other Ambulatory Visit: Payer: Self-pay

## 2021-01-14 ENCOUNTER — Other Ambulatory Visit: Payer: Self-pay

## 2021-01-16 ENCOUNTER — Ambulatory Visit: Payer: Medicaid (Managed Care)

## 2021-01-16 MED FILL — Iron Sucrose Inj 20 MG/ML (Fe Equiv): INTRAVENOUS | Qty: 10 | Status: AC

## 2021-01-20 ENCOUNTER — Other Ambulatory Visit: Payer: Self-pay

## 2021-01-21 ENCOUNTER — Encounter: Payer: Self-pay | Admitting: Gastroenterology

## 2021-01-21 ENCOUNTER — Encounter: Payer: Self-pay | Admitting: Hematology & Oncology

## 2021-01-21 NOTE — Telephone Encounter (Signed)
error 

## 2021-01-21 NOTE — Progress Notes (Signed)
CM called [facility] for food box request. Pickup scheduled for 11/4 at 10:15am.    CM called [employer] for baby supply request; call went to VM. CM left a brief message requesting a call back.    [facility] Manager  [facility]  O: [phone]  C: [phone]

## 2021-01-22 NOTE — Progress Notes (Signed)
WCI SOCIAL WORK:   [facility] Social Work Therapist, nutritional:     Patient referred based on distress screen?: No      Social Work contact made?: No      Social Work Assessment:  Barriers to Care and Communication    Barriers to care:  Housing and Absence Of Mining engineer with:  Healthcare Team    Social Work post assessment plan:  Referral and As needed follow-up  Referral:     Integrative Services:  [place] [phone]    Other referral::  [place] [phone]    Time spent on this encounter (in minutes):  15    Sw received a VM message from Lynda Rainwater who states that she met with patient yesterday and patient/family are in great need. She states that patient is homeless and has 4 of her 7 children living with her. Lonell Face is looking to see if SW can assist her in any way with accessing resources to assist.     [place] know that I am familiar with patient and have been working with her and her care manager to get her connected to resources as able. Lonell Face was going to email SW with some resources that she was able to obtain from [facility] that may help. Writer explained that Probation officer will review and assess.     Writer also informed [facility] Corrine regarding above call. Will work together to assess patients needs and further assist as able.     Pati Gallo, LMSW  12:12 PM  01/22/2021  [place]  Oncology Social Worker  Ph- [phone]

## 2021-01-29 ENCOUNTER — Encounter: Payer: Self-pay | Admitting: Gastroenterology

## 2021-01-29 NOTE — Progress Notes (Signed)
CM picked up food from [employer] and delivered to pt's temporary hotel. Pt thanked CM for assistance.    [facility] [employer] Manager  [facility]  O: [phone]  C: [phone]

## 2021-01-30 NOTE — Progress Notes (Signed)
Per pt request, CM scheduled transportation through [facility] to [facility] for MH counseling. CM also provided pt with updated housing list through [employer].    [facility] Manager  [facility]  O: [phone]  C: [phone]

## 2021-02-03 ENCOUNTER — Other Ambulatory Visit: Payer: Self-pay | Admitting: Internal Medicine

## 2021-02-03 ENCOUNTER — Other Ambulatory Visit: Payer: Self-pay

## 2021-02-03 ENCOUNTER — Telehealth: Payer: Self-pay | Admitting: Internal Medicine

## 2021-02-03 DIAGNOSIS — J4550 Severe persistent asthma, uncomplicated: Secondary | ICD-10-CM

## 2021-02-03 MED ORDER — BUDESONIDE-FORMOTEROL FUMARATE 160-4.5 MCG/ACT IN AERO *I*
2.0000 | INHALATION_SPRAY | Freq: Two times a day (BID) | RESPIRATORY_TRACT | 5 refills | Status: DC
Start: 2021-02-03 — End: 2021-04-20

## 2021-02-03 MED ORDER — ALBUTEROL SULFATE HFA 108 (90 BASE) MCG/ACT IN AERS *I*
1.0000 | INHALATION_SPRAY | Freq: Four times a day (QID) | RESPIRATORY_TRACT | 3 refills | Status: DC | PRN
Start: 2021-02-03 — End: 2021-04-20

## 2021-02-03 NOTE — Telephone Encounter (Signed)
Dr. Earlean Polka unavailable, refill request routed to Dr. Pedro Earls 02/03/2021 12:58 PM

## 2021-02-03 NOTE — Telephone Encounter (Signed)
Copied from CRM [id number]. Topic: Medications/Prescriptions - Refill Request  >> Feb 03, 2021 11:29 AM [name] wrote:  Jenna [name] calling to request prescription(s) budesonide-formoterol (SYMBICORT) 160-4.5 MCG/ACT inhaler and albuterol HFA (PROVENTIL, VENTOLIN, PROAIR HFA) 108 (90 Base) MCG/ACT inhaler  to be sent to the following Pharmacy [facility] .    Is patient out of the medication? yes    Patient's last visit?  11/13/20    Patient can be reached if necessary at [phone]. Marland Kitchen

## 2021-02-03 NOTE — Telephone Encounter (Signed)
Copied from CRM #[id number]. Topic: Access to Care - Speak to Provider/Office Staff  >> Feb 03, 2021 11:23 AM [name] wrote:  Ms. Jenna [name] calling to report that she is experiencing right sided neck pain on turning her neck.  Can feel spot in her back near her shoulder that is painful     No know injury.  Feels the pain on getting to standing    These symptoms have been going on for starting today    Patient tearful on in coming call.  Reporting the pain - hurts a lot     Patient requesting same day appointment? No    Patient requesting call back? Yes    Is wondering about prescriptin for muscle  relaxer being called in sent in to [facility] [place], [place]     Phone number confirmed at [phone]     l

## 2021-02-03 NOTE — Telephone Encounter (Signed)
Spoke with patient.       Patient reports:   -Neck/back muscles  pt believes she may have pulled a muscle    -Pt stated it took "20 hours for you to call back" and requested a doctor to prescribe her a muscle relaxer.    Advised patient it may be unlikely to receive a muscle relaxer without being evaluated to know what's going on. Advised patient may need to be evaluated at an Urgent Care. Patient requested a telemed appt and writer stated there are not appts left this week given the holiday. Patient stated "Okay then, bye" and abruptly hung up on Clinical research associate.       Writer was unable to assess the patient.

## 2021-02-04 ENCOUNTER — Other Ambulatory Visit: Payer: Self-pay

## 2021-02-04 NOTE — Progress Notes (Signed)
[name] is scheduled for a Primary Care Office appointment on 02/11/2021 at 3:45pm.  At 2:45pm [employer] will pick up Jenna [name] at [address], [place] and take them to [address] [place]  At 5:15pm [employer] will pick up Jenna [name] at [address], [place] and take them to [address], Rutha Melgoza may contact [employer] at [phone] for changes to pick-up and return times prior to this trip.  Inv# [id number]    [facility] Manager  [facility]  O: [phone]  C: [phone]

## 2021-02-09 NOTE — Progress Notes (Signed)
[facility] AC[facility] Clinic Follow-up Note     Reason For Visit:   DHS form    HPI:      Jenna [name] is 35 y.o. year old female last seen in our clinic on 9/01 for iron deficiency anemia. At that time, she was encouraged to keep her infusion center appt for iron deficiency anemia. She was counseled on smoking cessation. In the interim, she was seen by hematology for her anemia. She was recommended to have a transfusion and begin venofer x5 doses weekly.     Shortness of breath  - occurs all the time  - better when sitting propped up and made worse by laying flat  - sleeps on 6 pillows at night  - feels dyspneic on exertion- can only walk several hundred feet before feeling short of breath  - has occasional palpitation    Fatigue  - having trouble staying awake- falls asleep easily throughout the day  - falls asleep in the middle of conversations  - has significant snoring and was told by her SO that she stops breathing at night     Iron transfusion  - living in emergency housing in [place] and her SO had to go to work in order to keep their current housing. She had to stay home to watch the kids    Menorrhagia   - missed most recent appointments with OB/GYN  - continues to have really heavy periods, several times per month.     Would also like to discuss the following: weight gain, mood, appetite, hair loss, lactation       Assessment and Plan:   35 yo female with multiple comorbidities and barriers to care, presenting for work physical. I am concerned about her overall health trajectory and the lack of followup, which I expressed to her. Unfortunately, she has many psychosocial barriers to care, with multiple missed appointments. Will focus on most pressing issues while slowly addressing other concerns.     Severe iron deficiecny anemia 2/2 menorrhagia   - check CBC  - will send message to oncology about potentially rescheduling iron infusions    Subacute, progressive dyspnea with no evidence of  hypoxia: suspect this is multifactorial from severe iron deficiency anemia, deconditioning, likely OSA. She is reporting some orthopnea which makes me concerned about a cardiomyopathy. No evidence of hypoxia at rest or with ambulation  - check CBC now  - sleep study ordered  - TTE ordered  - CxR ordered  - can consider PFT's if above workup is unremarkable    Psychosocial stressors: housing insecurity, food insecurity, multiple children   - appreciate both peds and adult SW assistance at today's visit  - given food vouchers for cafeteria and for food pantry  - will continue to work with [facility], social work to best help [name]    Did not get a chance to do med rec today- will obtain on next visit    Other concerns- will address on upcoming visits     HCM:  - needs to be addressed slowly, on upcoming visits   Review of Systems:     ROS   12-point ROS negative except as noted above   Medications/Allergies/Immunizations:     Medication list reviewed and reconciled this visit in the EMR. Allergy list confirmed in the EMR.    Past Medical History/Family History/Social History:     Past Medical History, Family History, and Social History reviewed, confirmed, and updated as appropriate this visit in  the EMR.     Physical Exam:     Temp 36.9 C (98.4 F)    Wt (!) 147.1 kg (324 lb 6.4 oz)    SpO2 95%    BMI 53.98 kg/m     BP Readings from Last 3 Encounters:   12/04/20 140/86   11/21/20 143/64   11/13/20 139/80     Wt Readings from Last 3 Encounters:   02/11/21 (!) 147.1 kg (324 lb 6.4 oz)   06/18/20 (!) 140.6 kg (310 lb)   05/05/20 136.9 kg (301 lb 12.8 oz)       Vitals Signs: DGU:YQIH mass index is 53.98 kg/m., otherwise as above, reviewed with patient    General: older than stated age, somewhat anxious  HEENT: Normocephalic/atraumatic. Hearing intact to normal conversational tones  Cardiovascular: tachycardic, regular rhythm, no murmurs, rubs or gallops.   Resp: CTAB. No w/r/r. Normal work of breathing. Non-hypoxic on  ambulatory o2  Abdominal: Soft. Non-tender, non-distended, normoactive bowel sounds.   Neuro: A&O x 3.  moves all 4 extremities equally, strength grossly intact. Normal gait and speech. No abnormal movements noted. No tremors seen.  Psych: intermittently tearful, somewhat disorganized but redirectable    Labs and Imaging:   Labs and Imaging results reviewed with [name] .     Follow-up:     RTC: 1 week    Signed by:   [name], MD  PGY-3, Department of Internal Medicine   02/11/2021 5:21 PM

## 2021-02-11 ENCOUNTER — Other Ambulatory Visit: Payer: Self-pay

## 2021-02-11 ENCOUNTER — Ambulatory Visit
Payer: Medicaid (Managed Care) | Attending: Internal Medicine | Admitting: Student in an Organized Health Care Education/Training Program

## 2021-02-11 VITALS — Temp 98.4°F | Wt 324.4 lb

## 2021-02-11 DIAGNOSIS — Z5941 Food insecurity: Secondary | ICD-10-CM | POA: Insufficient documentation

## 2021-02-11 DIAGNOSIS — D500 Iron deficiency anemia secondary to blood loss (chronic): Secondary | ICD-10-CM | POA: Insufficient documentation

## 2021-02-11 DIAGNOSIS — R0600 Dyspnea, unspecified: Secondary | ICD-10-CM

## 2021-02-11 LAB — CBC AND DIFFERENTIAL
Baso # K/uL: 0.1 10*3/uL (ref 0.0–0.1)
Basophil %: 0.6 %
Eos # K/uL: 0.2 10*3/uL (ref 0.0–0.4)
Eosinophil %: 1.3 %
Hematocrit: 27 % — ABNORMAL LOW (ref 34–45)
Hemoglobin: 7 g/dL — ABNORMAL LOW (ref 11.2–15.7)
IMM Granulocytes #: 0.1 10*3/uL — ABNORMAL HIGH (ref 0.0–0.0)
IMM Granulocytes: 0.5 %
Lymph # K/uL: 2.8 10*3/uL (ref 1.2–3.7)
Lymphocyte %: 22.2 %
MCH: 20 pg — ABNORMAL LOW (ref 26–32)
MCHC: 26 g/dL — ABNORMAL LOW (ref 32–36)
MCV: 76 fL — ABNORMAL LOW (ref 79–95)
Mono # K/uL: 1 10*3/uL — ABNORMAL HIGH (ref 0.2–0.9)
Monocyte %: 8.2 %
Neut # K/uL: 8.3 10*3/uL — ABNORMAL HIGH (ref 1.6–6.1)
Nucl RBC # K/uL: 0 10*3/uL (ref 0.0–0.0)
Nucl RBC %: 0.2 /100 WBC (ref 0.0–0.2)
Platelets: 438 10*3/uL — ABNORMAL HIGH (ref 160–370)
RBC: 3.5 MIL/uL — ABNORMAL LOW (ref 3.9–5.2)
RDW: 18.3 % — ABNORMAL HIGH (ref 11.7–14.4)
Seg Neut %: 67.2 %
WBC: 12.4 10*3/uL — ABNORMAL HIGH (ref 4.0–10.0)

## 2021-02-11 LAB — TYPE AND SCREEN
ABO RH Blood Type: A POS
Antibody Screen: NEGATIVE

## 2021-02-12 ENCOUNTER — Other Ambulatory Visit: Payer: Self-pay

## 2021-02-12 ENCOUNTER — Other Ambulatory Visit: Payer: Self-pay | Admitting: Student in an Organized Health Care Education/Training Program

## 2021-02-12 ENCOUNTER — Telehealth: Payer: Self-pay

## 2021-02-12 DIAGNOSIS — D500 Iron deficiency anemia secondary to blood loss (chronic): Secondary | ICD-10-CM

## 2021-02-12 LAB — UNABLE TO PERFORM ADD-ON TESTING 1

## 2021-02-12 NOTE — Progress Notes (Signed)
Not picked up/delivered

## 2021-02-12 NOTE — Telephone Encounter (Signed)
Left message for patient seeing if she could come in for a iron infusion tomorrow (either 9 am or 1 pm). Writer requested that she call us back to let us know so that a ride could be arrange for her if she can make the appt tomorrow.

## 2021-02-12 NOTE — Progress Notes (Signed)
Karin Pinedo is scheduled for a Mental Health Counseling appointment on 02/12/2021 at 1:00pm.  At 12:00pm [employer] will pick up [name] at [address], [place] and take them to [address] [place], [place]  At 2:30pm [employer] will pick up [name] at [address] [place], [place] and take them to [address], [place] may contact [employer] at [phone] for changes to pick-up and return times prior to this trip.  Inv# [id number]    Ryiah Bellissimo is scheduled for a Primary Care Office appointment on 02/18/2021 at 1:45pm.  At 12:45pm [employer] will pick up [name] at [address], [place] and take them to [address], [place]  At 3:15pm [employer] will pick up [name] at [address] [place] and take them to [address], Pete Merten may contact [employer] at [phone] for changes to pick-up and return times prior to this trip.  Inv# [id number]    [facility] Manager  [facility]  O: [phone]  C: [phone]

## 2021-02-12 NOTE — Telephone Encounter (Signed)
Attempted to call pt to try and offer her an appointment. Pt did not answer and was unable to leave a message.

## 2021-02-13 ENCOUNTER — Telehealth: Payer: Self-pay | Admitting: Hematology & Oncology

## 2021-02-13 NOTE — Telephone Encounter (Signed)
Returned patient call to set up iron infusion - patient offered Tuesday 02/17/21 at 12:00 pm and she can make that appointment. Writer reached out to [employer], SW to arrange transportation for patient. Patient verbalized understanding and is agreeable to plan.

## 2021-02-13 NOTE — Progress Notes (Addendum)
[facility] SOCIAL WORK:   [facility] Social Work Chief Executive Officer:     Patient referred based on distress screen?: No      Social Work contact made?: Yes      Method of contact:  Left message    Social Work Assessment:  Barriers to Care    Barriers to care:  Transportation  Referral:     Time spent on this encounter (in minutes):  25  Jenna [name]'s Trip  Date: 02/17/2021 Time: 12:00pm Inv# [id number]  Reason for Trip  Oncology/Cancer    Medicaid ID (CIN #)  [id number]    [facility] Address  [address] [place], [place] [address]    [employer] Phone  [phone]    Contact Name  Freddie Apley    Contact Phone  [phone]    Transport  [employer] Phone  [phone]    Medical Provider  Al Corpus MD Roseanne Reno    Phone  Unavailable    Transport Type  Taxi/Livery    Trip Legs  Upon Arrival  [place]    Additional Rider(s)  Consulting civil engineer  NO    Service Animal  NO    Pickup Address  [address] [place], [place] [address]    Apt/[address]  Destination Address  [address] [place], [place] [address]    A[address]  [address]    Pickup Time  11:00am    Appointment Time  12:00pm    [employer] Address  [address] [place], [place] [address]    [address]    Destination Address  [address] [place], [place] [address]    Apt/Suite  Daryll Drown Time  2:00pm    Addendum: message left for pt re: transportation arrangements. Advised her to call SW if further questions arise.

## 2021-02-16 NOTE — Progress Notes (Incomplete)
[facility] Clinic Follow-up Note     Reason For Visit:   FUV    HPI:      Jenna [name] is 35 y.o. year old female last seen in our clinic on 11/30 for worsening fatigue and DOE in setting of known iron deficiency. Labs showed chronic, stable iron deficiency anemia.     Shortness of breath    Lactation       Assessment and Plan:   35 yo female with multiple comorbidities and barriers to care, presenting for work physical. I am concerned about her overall health trajectory and the lack of followup, which I expressed to her. Unfortunately, she has many psychosocial barriers to care, with multiple missed appointments.    Severe iron deficiecny anemia 2/2 menorrhagia   - following with oncology, missed appt ***    Galactorrhea     Subacute, progressive dyspnea with no evidence of hypoxia: suspect this is multifactorial from severe iron deficiency anemia, deconditioning, likely OSA. She is reporting some orthopnea which makes me concerned about a cardiomyopathy. No evidence of hypoxia at rest or with ambulation  - sleep study ordered  - TTE ordered  - CxR ordered  - can consider PFT's if above workup is unremarkable    Psychosocial stressors: housing insecurity, food insecurity, multiple children   - will continue to work with [facility], social work to best help [place]      HCM:  - needs to be addressed slowly, on upcoming visits   Review of Systems:     ROS   12-point ROS negative except as noted above   Medications/Allergies/Immunizations:     Medication list reviewed and reconciled this visit in the EMR. Allergy list confirmed in the EMR.    Past Medical History/Family History/Social History:     Past Medical History, Family History, and Social History reviewed, confirmed, and updated as appropriate this visit in the EMR.     Physical Exam:     There were no vitals taken for this visit.    BP Readings from Last 3 Encounters:   12/04/20 140/86   11/21/20 143/64   11/13/20 139/80     Wt Readings from Last  3 Encounters:   02/11/21 (!) 147.1 kg (324 lb 6.4 oz)   06/18/20 (!) 140.6 kg (310 lb)   05/05/20 136.9 kg (301 lb 12.8 oz)       Vitals Signs: UEA:VWUJW is no height or weight on file to calculate BMI., otherwise as above, reviewed with patient    General: older than stated age, somewhat anxious  HEENT: Normocephalic/atraumatic. Hearing intact to normal conversational tones  Cardiovascular: tachycardic, regular rhythm, no murmurs, rubs or gallops.   Resp: CTAB. No w/r/r. Normal work of breathing. Non-hypoxic on ambulatory o2  Abdominal: Soft. Non-tender, non-distended, normoactive bowel sounds.   Neuro: A&O x 3.  moves all 4 extremities equally, strength grossly intact. Normal gait and speech. No abnormal movements noted. No tremors seen.  Psych: intermittently tearful, somewhat disorganized but redirectable    Labs and Imaging:   Labs and Imaging results reviewed with [name] .     Follow-up:     RTC: 1 week    Signed by:   [name], MD  PGY-3, Department of Internal Medicine   02/16/2021 11:19 AM

## 2021-02-17 ENCOUNTER — Ambulatory Visit: Payer: Medicaid (Managed Care) | Attending: Rehabilitative and Restorative Service Providers"

## 2021-02-17 ENCOUNTER — Other Ambulatory Visit: Payer: Self-pay

## 2021-02-17 VITALS — BP 143/67 | HR 84 | Temp 96.3°F

## 2021-02-17 DIAGNOSIS — D500 Iron deficiency anemia secondary to blood loss (chronic): Secondary | ICD-10-CM | POA: Insufficient documentation

## 2021-02-17 MED ORDER — SODIUM CHLORIDE 0.9 % IV SOLN WRAPPED *I*
200.0000 mg | Freq: Once | INTRAVENOUS | Status: AC
Start: 2021-02-17 — End: 2021-02-17
  Administered 2021-02-17: 200 mg via INTRAVENOUS
  Filled 2021-02-17: qty 10
  Filled 2021-02-17: qty 200
  Filled 2021-02-17 (×5): qty 10

## 2021-02-17 MED ORDER — FAMOTIDINE (PF) 20 MG/2ML IV SOLN *I*
20.0000 mg | Freq: Once | INTRAVENOUS | Status: AC
Start: 2021-02-17 — End: 2021-02-17
  Administered 2021-02-17: 20 mg via INTRAVENOUS
  Filled 2021-02-17 (×7): qty 2

## 2021-02-17 MED ORDER — METHYLPREDNISOLONE SOD SUCC 125 MG IJ SOLR(62.5MG/ML) *WRAPPED*
62.5000 mg | Freq: Once | INTRAMUSCULAR | Status: AC
Start: 2021-02-17 — End: 2021-02-17
  Administered 2021-02-17: 62.5 mg via INTRAVENOUS
  Filled 2021-02-17 (×7): qty 1

## 2021-02-17 NOTE — Progress Notes (Signed)
[facility] SOCIAL WORK:   [facility] Social Work Chief Executive Officer:     Patient referred based on distress screen?: No      Social Work contact made?: Yes      Method of contact:  Telephone    Social Work Assessment:  Barriers to Care    Barriers to care:  Transportation  Referral:     Time spent on this encounter (in minutes):  15    Call received from pt that she needed additional riders added to her MAS trip scheduled for today. SW edited trip for today and added additional riders. SW called cab company and confirmed that they could accommodate this. Pt asked for 1130 pickup and states that will be enough time. SW conferred with cab company and they will pick up up and additional riders about 11:15 for 12 noon appt.     New invoice 4 [id number] obtained. Will continue to assist as needed.

## 2021-02-17 NOTE — Progress Notes (Signed)
Patient arrived to infusion in stable condition. Chart reviewed; patient within parameters for scheduled treament. Pre-meds given per order and patient tolerated Venofer via PIV well, without complaint. Positive blood return from PIV pre and post infusion. VSS post infusion and pt without event for 30 minute observation period. Patient denied questions prior to discharge and was encouraged to reach out to clinic team with future questions or concerns. Patient verbalized understanding and was discharged in stable condition.

## 2021-02-18 ENCOUNTER — Ambulatory Visit: Payer: Medicaid (Managed Care) | Admitting: Student in an Organized Health Care Education/Training Program

## 2021-02-19 ENCOUNTER — Other Ambulatory Visit: Payer: Self-pay

## 2021-02-19 NOTE — Progress Notes (Signed)
CM picked up Thanksgiving package from [employer] and delivered to pt's temporary home. Pt thanked CM for the assistance.    [facility] Manager  [facility]  O: [phone]  C: [phone]

## 2021-02-20 ENCOUNTER — Other Ambulatory Visit: Payer: Self-pay

## 2021-02-20 NOTE — Progress Notes (Signed)
Jenna [name] is scheduled for a OB/GYN appointment on 03/11/2021 at 9:30am.  At 8:30am [employer] will pick up Jenna [name] at [address], [place] and take them to [address], [place]  At 11:00am [employer] will pick up Jenna [name] at [address], [place] and take them to [address], [place] may contact [employer] at [phone] for changes to pick-up and return times prior to this trip.  Inv# [id number]    Jenna [name] is scheduled for a Cardiology/Heart appointment on 03/13/2021 at 1:15pm.  At 12:15pm [employer] will pick up Jenna [name] at [address], [place] and take them to [address], [place]  At 2:45pm [employer] will pick up Jenna [name] at [employer], [place] and take them to [address], [place] may contact [employer] at [phone] for changes to pick-up and return times prior to this trip.  Inv# [id number]    [facility] Manager  [facility]  O: [phone]  C: [phone]

## 2021-02-23 ENCOUNTER — Other Ambulatory Visit: Payer: Self-pay

## 2021-02-24 ENCOUNTER — Other Ambulatory Visit: Payer: Self-pay

## 2021-03-05 ENCOUNTER — Telehealth: Payer: Self-pay | Admitting: Internal Medicine

## 2021-03-05 ENCOUNTER — Other Ambulatory Visit: Payer: Self-pay

## 2021-03-05 NOTE — Telephone Encounter (Signed)
Called and spoke to [employer] transportation, they can only accommodate for 4 people, patient aware of this.    Trip Confirmation  Jenna [name] is scheduled for a Mental Health Counseling appointment on 03/05/2021 at 12:15pm.  At 11:15am [employer] will pick up Jenna [name] at [address], [place] and take them to [address] [place], [place]  At 1:45pm [employer] will pick up Jenna [name] at [address] [place], [place] and take them to [employer], [place]  Jenna [name] may contact [employer] at [phone] for changes to pick-up and return times prior to this trip.    To ensure seamless access to your healthcare:  If the assigned Transportation Provider cannot accommodate your trip, MAS will reassign it to the next available provider  On the day of the trip, you may confirm your provider - call the automated MAS System & select option 'Confirm My Trip'      Jenna [name] Trip  Date: 03/05/2021 Time: 12:15pm Inv# [id number]

## 2021-03-05 NOTE — Progress Notes (Signed)
Sarha Bartelt is scheduled for a OB/GYN appointment on 03/11/2021 at 9:30am.  At 8:30am [employer] will pick up [name] at [address], [place] and take them to [address], [place]  At 11:00am [employer] will pick up [name] at [employer], [place] and take them to [address] [place]  Tasheika Kitzmiller may contact [employer] at [phone] for changes to pick-up and return times prior to this trip.  Inv# [id number]    Brigette Hopfer is scheduled for a Cardiology/Heart appointment on 03/13/2021 at 1:15pm.  At 12:15pm [employer] will pick up [name] at [address] [place] and take them to [address], [place]  At 2:45pm [employer] will pick up [name] at [employer], [place] and take them to [employer], [place]  Shambhavi Salley may contact [employer] at [phone] for changes to pick-up and return times prior to this trip.  Inv# [id number]    [facility] Manager  [facility]  O: [phone]  C: [phone]

## 2021-03-05 NOTE — Telephone Encounter (Signed)
Copied from CRM [id number]. Topic: Information Request - Transportation Information  >> Mar 05, 2021 10:06 AM [name] wrote:  Jamiyah says she is unable to reach her social worker and she always make last minute transportation arrangements for her.    Writer did tell her that this is under 3 hours notice and they may ask her to reschedule the appt.    Patient is requesting transportation to their upcoming appointment on:   03/05/2021 at  12:00 pm located at the office [facility] with an address of: [address]    Is the patient experiencing any symptoms? no  If yes, the symptoms are: .    Pick up patient at (verified) home address? Yes  Confirmation of pick up address is:     Extended Stay   [address]   [place] [address]    Special accommodations needed (walker, wheelchair, stretcher and/or type of vehicle)? +4 for a total of 5 Zenaida Niece  Will they need any assistance from the driver from the door of the home to the vehicle (please specify if there are steps)? no      Preferred transportation company?[employer]    Patient contact number Telephone Information:  Mobile          [phone]

## 2021-03-06 ENCOUNTER — Encounter: Payer: Self-pay | Admitting: Gastroenterology

## 2021-03-06 NOTE — Progress Notes (Signed)
CM picked up food for pt at [facility] and delivered to her. CM took pt and belongings to [address] to move into temporary home. Pt thanked CM for the assistance.    [facility] Manager  [facility]  O: [phone]  C: [phone]

## 2021-03-09 ENCOUNTER — Encounter: Payer: Self-pay | Admitting: Gastroenterology

## 2021-03-09 NOTE — Progress Notes (Signed)
Pt called CM because her DHS benefit is being cut off; pt was not approved for the temporary house from last week and has subsequently gone back to the motel in [place]. CM contacted [facility], [employer], and [employer] for further assistance. All shelters are full, however pt can apply for assistance through [facility] CM sent all information to pt.    [facility] Manager  [facility]  O: [phone]  C: [phone]

## 2021-03-10 ENCOUNTER — Encounter: Payer: Self-pay | Admitting: Gastroenterology

## 2021-03-10 NOTE — Progress Notes (Signed)
Pt called to request help with delivery of items from [facility]. CM picked up items and delivered to pt's motel in [place]. Pt thanked CM for the assistance.    [facility] Manager  [facility]  O: [phone]  C: [phone]

## 2021-03-11 ENCOUNTER — Ambulatory Visit: Payer: Medicaid (Managed Care) | Admitting: Obstetrics and Gynecology

## 2021-03-11 ENCOUNTER — Telehealth: Payer: Self-pay

## 2021-03-11 ENCOUNTER — Other Ambulatory Visit: Payer: Self-pay

## 2021-03-11 NOTE — Telephone Encounter (Signed)
Jenna [name], MSWSocial Worker  11:09 AM               Pt states that she is having swelling in both legs and pain in her left side. Pt says that her fiance noticed her mouth "foaming" while she was asleep. She states that she does not feel good at all and is not sure what the cause is. She is not sure if she needs to be seen.    [facility] Manager  [facility]  O: [phone]  C: [phone]

## 2021-03-11 NOTE — Telephone Encounter (Signed)
Spoke with patient.       Patient reports:   -Sx onset: 12/27  -Leg swelling; tight and hurts to touch  -"My kidney hurts"  -Pt feeling short of breath when laying down; feeling like she is gasping for air  -Fiance caught patient foaming at the mouth 2 nights ago while sleeping  -Palpitations coming and going  -Urinary Frequency/urgency    Patient denies:   -pt was never unconscious  -chest pain  -Headache  -Fever    Advised patient to be seen in clinic today - offered an appt. Pt declined. Advised patient it would be best to seek care in the ED then today. Pt declined. Offered patient an appt in the AM of 12/29, pt declined as it was too earlier. Scheudled patient with practice APP. Highly encouraged patient to seek care sooner and reviewed symptoms to be mindful of. Pt does have an echo on Friday 12/30. Pt needs MAS scheduled for appt on Friday. Advised patient the pick up will be at   Patient agrees with plan.       Advised pt to call in the meantime with questions or concerns, or to seek care with worsening symptoms. Patient verbalized understanding.

## 2021-03-11 NOTE — Telephone Encounter (Signed)
Trip Confirmation  Jenna [name] is scheduled for a Primary Care Office appointment on 03/13/2021 at 9:45am.  At 8:45am [employer] will pick up Jenna [name] at [address] [place] and take them to [address] [place], [place]  At [employer] will pick up Jenna [name] at [address] [place] and take them to [address], [address]  Jenna [name] may contact [employer] at [phone] for changes to pick-up and return times prior to this trip.    To ensure seamless access to your healthcare:  If the assigned Transportation Provider cannot accommodate your trip, MAS will reassign it to the next available provider  On the day of the trip, you may confirm your provider - call the automated MAS System & select option 'Confirm My Trip'      Jenna [name] Trip  Date: 03/13/2021 Time: 9:45am Inv# [id number]

## 2021-03-11 NOTE — Progress Notes (Signed)
See alternate encounter. °

## 2021-03-11 NOTE — Progress Notes (Signed)
Pt states that she is having swelling in both legs and pain in her left side. Pt says that her fiance noticed her mouth "foaming" while she was asleep. She states that she does not feel good at all and is not sure what the cause is. She is not sure if she needs to be seen.    [facility] Manager  [facility]  O: [phone]  C: [phone]

## 2021-03-13 ENCOUNTER — Ambulatory Visit: Payer: Medicaid (Managed Care) | Admitting: Family

## 2021-03-13 ENCOUNTER — Other Ambulatory Visit: Payer: Medicaid (Managed Care)

## 2021-03-13 ENCOUNTER — Ambulatory Visit: Payer: Medicaid (Managed Care)

## 2021-03-17 ENCOUNTER — Other Ambulatory Visit: Payer: Self-pay

## 2021-03-17 NOTE — Progress Notes (Signed)
Jenna [name] is scheduled for a Cardiology/Heart appointment on 03/18/2021 at 2:30pm.  At 1:30pm [employer] will pick up Jenna [name] at [address] [place] and take them to [address], [place]  At 4:00pm [employer] will pick up Jenna [name] at [employer], [place] and take them to [employer], [place]  Jenna [name] may contact [employer] at [phone] for changes to pick-up and return times prior to this trip.  Inv# [id number]    [facility] Manager  [facility]  O: [phone]  C: [phone]

## 2021-03-18 ENCOUNTER — Ambulatory Visit
Admission: RE | Admit: 2021-03-18 | Discharge: 2021-03-18 | Disposition: A | Discharge status: Home / Self Care | Payer: Medicaid (Managed Care) | Source: Ambulatory Visit | Attending: Cardiology | Admitting: Cardiology

## 2021-03-18 ENCOUNTER — Other Ambulatory Visit: Payer: Self-pay

## 2021-03-18 ENCOUNTER — Encounter: Payer: Self-pay | Admitting: Gastroenterology

## 2021-03-18 DIAGNOSIS — R0600 Dyspnea, unspecified: Secondary | ICD-10-CM | POA: Insufficient documentation

## 2021-03-20 LAB — ECHO COMPLETE
Aortic Diameter (mid tubular): 3.2 cm
Aortic Diameter (sinus of Valsalva): 3.3 cm
BMI: 54 kg/m2
BP Diastolic: 70 mmHg
BP Systolic: 137 mmHg
BSA: 2.6 m2
Deceleration Time - MV: 180 ms
E/A ratio: 1.37
Heart Rate: 67 {beats}/min
Height: 65 in
IVC Diameter: 1.8 cm
LA Diameter BSA Index: 1.8 cm/m2
LA Diameter Height Index: 2.9 cm/m
LA Diameter: 4.8 cm
LA Systolic Diameter: 4.8 cm
LA Systolic Vol BSA Index: 45.4 mL/m2
LA Systolic Vol Height Index: 71.5 mL/m
LA Systolic Volume: 118 mL
LV ASE Mass BSA Index: 142.4 gm/m2
LV ASE Mass Height 2.7 Index: 95.6 gm/m2.7
LV ASE Mass Height Index: 224.3 gm/m
LV ASE Mass: 370.3 gm
LV CO BSA Index: 2.55 L/min/m2
LV Cardiac Output: 6.63 L/min
LV Diastolic Volume Index: 66.2 mL/m2
LV Posterior Wall Thickness: 1.28 cm
LV SV - LVOT SV Diff: 1.74 mL
LV SV - LVOT SV Diff: 1.75 %
LV SV BSA Index: 38.1 mL/m2
LV SV Height Index: 60 mL/m
LV Septal Thickness: 1.31 cm
LV Stroke Volume: 99 mL
LV Systolic Volume Index: 28.1 mL/m2
LV wall/cavity ratio: 0.42
LVED Diameter BSA Index: 2.4 cm/m2
LVED Diameter Height Index: 3.77 cm/m
LVED Diameter: 6.23 cm
LVED Volume BSA Index: 66 ml/m2
LVED Volume BSA Index: 66.2 mL/m2
LVED Volume Height Index: 104.2 mL/m
LVED Volume: 172.1 mL
LVEF (Volume): 58 %
LVES Volume BSA Index: 28 ml/m2
LVES Volume BSA Index: 28.1 mL/m2
LVES Volume Height Index: 44.3 mL/m
LVES Volume: 73.1 mL
LVOT Area (calculated): 3.49 cm2
LVOT Cardiac Index: 2.51 L/min/m2
LVOT Cardiac Output: 6.52 L/min
LVOT Diameter: 2.11 cm
LVOT PWD VTI: 27.83 cm
LVOT PWD Velocity (mean): 96.9 cm/s
LVOT PWD Velocity (peak): 133.6 cm/s
LVOT SV BSA Index: 37.41 mL/m2
LVOT SV Height Index: 58.9 mL/m
LVOT Stroke Rate (mean): 338.7 mL/s
LVOT Stroke Rate (peak): 466.9 mL/s
LVOT Stroke Volume: 97.26 cc
MR Regurgitant Fraction (LV SV Mtd): 0.02
MR Regurgitant Volume (LV SV Mtd): 1.7 mL
MV Peak A Velocity: 66.6 cm/s
MV Peak E Velocity: 91.3 cm/s
Mitral Annular E/Ea Vel Ratio: 12.19
Mitral Annular Ea Velocity: 7.49 cm/s
RA Pressure Estimate: 5 mmHg
RA Volume BSA Index: 20.8 mL/m2
RA Volume Height Index: 32.7 mL/m
RA Volume: 54 mL
RR Interval: 895.52 ms
SEM (LVOT Mean) mN-s: 99.02 mN-s
SEM (LVOT peak) mN-s: 136.52 mN-s
Weight (lbs): 324 [lb_av]
Weight: 5184 oz

## 2021-03-31 ENCOUNTER — Telehealth: Payer: Self-pay | Admitting: Internal Medicine

## 2021-03-31 NOTE — Telephone Encounter (Signed)
Copied from CRM [id number]. Topic: Access to Care - Speak to Provider/Office Staff  >> Mar 31, 2021 10:53 AM Marga Hoots wrote:  Patient is calling to speak with her care manager regarding an appointment. Patient wouldn't go into furhter details.  Patient can be reached at [phone]  .

## 2021-04-06 ENCOUNTER — Telehealth: Payer: Self-pay | Admitting: Internal Medicine

## 2021-04-06 NOTE — Telephone Encounter (Addendum)
Attempted to contact patient, but was sent to voicemail. Patients voicemail is full.    Writer will attempt to contact patient 04/07/21      [name], MD  [facility]  [place],     If able, please reach out to [place]. We got the results of her Echo. It shows that her heart is a bit hypertrophied or bulky- may be due to uncontrolled blood pressure. We need to discuss next steps in person. Please have her schedule an appointment soon to be seen in clinic.     Thanks,   Maralyn Sago

## 2021-04-07 NOTE — Telephone Encounter (Signed)
Writer attempted to contact patient X2 but was sent to voicemail.    If patient returns call, please schedule appointment with Dr. Earlean Polka to discuss echo results.

## 2021-04-09 ENCOUNTER — Telehealth: Payer: Self-pay

## 2021-04-09 NOTE — Telephone Encounter (Signed)
If able, please reach out to [place]. We got the results of her Echo. It shows that her heart is a bit hypertrophied or bulky- may be due to uncontrolled blood pressure. We need to discuss next steps in person. Please have her schedule an appointment soon to be seen in clinic.     Thanks,   Maralyn Sago

## 2021-04-09 NOTE — Telephone Encounter (Signed)
Letter was written and sent to patient per provider's request.

## 2021-04-13 ENCOUNTER — Encounter: Payer: Self-pay | Admitting: Gastroenterology

## 2021-04-20 ENCOUNTER — Ambulatory Visit: Payer: Medicaid (Managed Care) | Admitting: Student in an Organized Health Care Education/Training Program

## 2021-04-20 ENCOUNTER — Telehealth: Payer: Self-pay | Admitting: Internal Medicine

## 2021-04-20 ENCOUNTER — Other Ambulatory Visit: Payer: Self-pay

## 2021-04-20 ENCOUNTER — Encounter: Payer: Self-pay | Admitting: Student in an Organized Health Care Education/Training Program

## 2021-04-20 VITALS — HR 83 | Temp 96.8°F | Wt 331.6 lb

## 2021-04-20 DIAGNOSIS — I10 Essential (primary) hypertension: Secondary | ICD-10-CM

## 2021-04-20 DIAGNOSIS — J4550 Severe persistent asthma, uncomplicated: Secondary | ICD-10-CM

## 2021-04-20 DIAGNOSIS — R059 Cough, unspecified: Secondary | ICD-10-CM

## 2021-04-20 MED ORDER — GUAIFENESIN-DEXTROMETHORPHAN 100-10 MG/5 ML PO SYRP/LIQD WRAPPED *I*
5.0000 mL | ORAL_SOLUTION | Freq: Three times a day (TID) | ORAL | 0 refills | Status: DC | PRN
Start: 2021-04-20 — End: 2023-06-30
  Filled 2021-04-20: qty 120, 8d supply, fill #0

## 2021-04-20 MED ORDER — AMLODIPINE BESYLATE 5 MG PO TABS *I*
5.0000 mg | ORAL_TABLET | Freq: Every day | ORAL | 1 refills | Status: DC
Start: 2021-04-20 — End: 2021-06-17
  Filled 2021-04-20 (×2): qty 30, 30d supply, fill #0

## 2021-04-20 MED ORDER — ALBUTEROL SULFATE HFA 108 (90 BASE) MCG/ACT IN AERS *I*
1.0000 | INHALATION_SPRAY | Freq: Four times a day (QID) | RESPIRATORY_TRACT | 3 refills | Status: DC | PRN
Start: 2021-04-20 — End: 2021-07-30
  Filled 2021-04-20: qty 18, 25d supply, fill #0

## 2021-04-20 MED ORDER — BENZONATATE 200 MG PO CAPS *I*
200.0000 mg | ORAL_CAPSULE | Freq: Three times a day (TID) | ORAL | 1 refills | Status: AC | PRN
Start: 2021-04-20 — End: 2021-04-27
  Filled 2021-04-20: qty 21, 7d supply, fill #0

## 2021-04-20 MED ORDER — ACETAMINOPHEN 500 MG PO TABS *I*
1000.0000 mg | ORAL_TABLET | Freq: Three times a day (TID) | ORAL | 0 refills | Status: DC | PRN
Start: 2021-04-20 — End: 2021-11-25
  Filled 2021-04-20: qty 100, 17d supply, fill #0

## 2021-04-20 MED ORDER — BUDESONIDE-FORMOTEROL FUMARATE 160-4.5 MCG/ACT IN AERO *I*
2.0000 | INHALATION_SPRAY | Freq: Two times a day (BID) | RESPIRATORY_TRACT | 5 refills | Status: DC
Start: 2021-04-20 — End: 2021-07-30
  Filled 2021-04-20: qty 10.2, 30d supply, fill #0

## 2021-04-20 NOTE — Progress Notes (Signed)
Patient in [address] waiting room crying in pain. Patient states her ribs hurt every time she coughs. Checked patient's O2 sat - 100%. Advised patient provider will help her as soon as possible. Patient agrees with plan.

## 2021-04-20 NOTE — Telephone Encounter (Signed)
Spoke with patient.       Patient reports:   Bad cold, severe cough  Mostly dry cough, with light mucus  Itchiness in throat occurring for a week  Insides hurt, ribcage hurts from coughing.  Isn't sure if it is "my lungs, my kidneys". States the pain is under her ribcage.    Patient denies:   Fever  Sore throat    All kids were sick last week, now patient is.    Tried tea, cough medicine etc    Patient states she had something similar before and prednisone helped. Explained she would need to be seen .    Patient requires a later appointment and transportation. Patient scheduled off team with Dr. Mena Pauls for today 2/6.    Trip Confirmation  Parneet Glantz is scheduled for a Sick Visit/Urgent Care appointment on 04/20/2021 at 4:15pm.  At 3:15pm [employer] [facility] will pick up [name] at [address] [place] and take them to [address], [place]  At 7:15pm [employer] [facility] will pick up [name] at [address], [place] and take them to [employer], [place]  Glendene Wyer may contact [employer] [facility] at [phone] for changes to pick-up and return times prior to this trip.    To ensure seamless access to your healthcare:  If the assigned Transportation Provider cannot accommodate your trip, MAS will reassign it to the next available provider  On the day of the trip, you may confirm your provider - call the automated MAS System & select option 'Confirm My Trip'      Yazmeen Woolf Trip  Date: 04/20/2021 Time: 4:15pm Inv# [phone]

## 2021-04-20 NOTE — Progress Notes (Signed)
[facility] Follow-up Visit     Reason For Visit:   cough     HPI:      Jenna [name] is 36 y.o. year old female with asthma, cass III obesity who presents to clinic for cough.     Symptom onset was about 1 weeks ago. All her children had URI symptoms. Then her symptoms began with itchy throat, nasal congestion and cough. She has not had fevers, chills or increased dyspnea. Her cough is not productive, but it is very persistent and strong. She has started to have RUQ/subcostal pain that only occurs with the cough. She does not have exertion chest pain, or pain with rest. Has not noticed any PND or orthopnea.    She hasn't taken anything for the pain or cough as she is currently living in a hotel and lost all her meds in the transition.     She is also experiencing food insecurity.     Review of Systems:     ROS   12-point ROS negative except as noted above  Medications/Allergies/Immunizations:     Medication list reviewed and reconciled this visit in the EMR. Allergy list confirmed in the EMR.    Past Medical History/Family History/Social History:     Past Medical History, Family History, and Social History reviewed, confirmed, and updated as appropriate this visit in the EMR.     Physical Exam:     Pulse 83    Temp 36 C (96.8 F) (Temporal)    Wt (!) 150.4 kg (331 lb 9.6 oz)    SpO2 100%    BMI 55.18 kg/m     BP Readings from Last 3 Encounters:   02/17/21 143/67   12/04/20 140/86   11/21/20 143/64     Wt Readings from Last 3 Encounters:   04/20/21 (!) 150.4 kg (331 lb 9.6 oz)   03/18/21 (!) 147 kg (324 lb)   02/11/21 (!) 147.1 kg (324 lb 6.4 oz)       [id number] mass index is 55.18 kg/m.    General: no acute distress, emotionally labile   HEENT: No conjunctival injection, no conjunctival pallor, no scleral icterus. Hearing is intact to normal conversational tones. Oropharynx without erythema or exudate.   Cardiovascular: Regular rate, regular rhythm, no murmurs, rubs or gallops. RUQ/subcostal pain is  reproducible on palpation   Resp: CTAB. No crackles or wheezing. Normal work of breathing.   Abdominal: Soft. Non-tender, non-distended.  No hepatomegaly or splenomegaly appreciated.   Extremity: No edema. Lower extremities appear symmetric.       Labs and Imaging:   Labs and Imaging results reviewed with Jenna [name] .     Assessment and Plan:       # cough from likely viral URI. She has had negative COVID testing.  - supportive care along with Tessalon perls and tylenol for pain control  - it does not seem she has asthma exacerbation now  - will have her return to clinic later this week to evaluate symptoms for resolution     # housing & food insecurity  - she is living in a hotel currently. Offered [employer] which she declined in preference for [employer] voucher. Was able to give $20 voucher today.   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 We discussed best thing to do is get BP under control and weight reduction. She has been  off amlodipine for unknown amount of time, so will start with resuming that and maintaining regular FUV          Follow-up:     RTC:  End of the week    Signed by:   Linton Flemings, MD, PGY-2   04/20/2021 5:33 PM

## 2021-04-20 NOTE — Telephone Encounter (Signed)
Copied from CRM [id number]. Topic: Access to Care - Speak to Provider/Office Staff  >> Apr 20, 2021 11:48 AM Marga Hoots wrote:  Patient is calling for results to her echo test .Patient is aware of her appointment today but wants to discuss now. Patient can be reached at [phone]

## 2021-04-20 NOTE — Telephone Encounter (Signed)
Copied from CRM [id number]. Topic: Access to Care - Speak to Provider/Office Staff  >> Apr 20, 2021  9:47 AM Maryelizabeth Kaufmann wrote:  Jenna [name] calling to report that she is experiencing cough. These symptoms have been going on for 1 week(s)    Patient requesting same day appointment? yes  Patient requesting call back? yes    Phone number confirmed at [phone]    Patient stated she is having a severe cough and she feels like her insides are hurting when she coughs. Patient states her children were sick and she thinks it is a really bad cold, patient stated she tested negative with a home covid test. Patient denied any shortness of breath or chest pain.

## 2021-04-22 ENCOUNTER — Other Ambulatory Visit: Payer: Self-pay

## 2021-04-24 ENCOUNTER — Encounter: Payer: Self-pay | Admitting: Gastroenterology

## 2021-04-24 ENCOUNTER — Ambulatory Visit: Payer: Medicaid (Managed Care) | Admitting: Adult Health

## 2021-04-24 ENCOUNTER — Other Ambulatory Visit: Payer: Self-pay

## 2021-04-24 NOTE — Progress Notes (Signed)
Pt called to request help with picking up food from [address] [address]. Pt is not currently receiving SNAP or help from [facility] as she is working. CM picked up from church site and delivered to pt's temporary home. Pt thanked CM for the assistance.    [facility] Manager  [facility]  O: [phone]  C: [phone]

## 2021-04-27 ENCOUNTER — Encounter: Payer: Self-pay | Admitting: Internal Medicine

## 2021-04-29 NOTE — Progress Notes (Signed)
Pt called CM to confirm her appt time, however by the time pt messaged, her transportation was already gone. CM provided pt with a DHS housing list per her request.    [facility] Manager  [facility]  O: [phone]  C: [phone]

## 2021-05-04 ENCOUNTER — Other Ambulatory Visit: Payer: Self-pay

## 2021-05-11 ENCOUNTER — Encounter: Payer: Self-pay | Admitting: Gastroenterology

## 2021-05-11 NOTE — Progress Notes (Addendum)
CM updated comp and sent pt crisis plan to review.    Assessment Utilized: Comprehensive Assessment  Names/Roles of participants: Jenna [name] - pt/[name] - HHCM.  Description of Client participation: Pt engaged throughout assessment.  Status of completion of assessment: Assessment completed through phone calls and face to face visits.  Summary of findings relevant to Care Management work: Pt to continue to assist pt with finding appropriate housing and resources.  Next steps to address findings: CM to help pt schedule any needed appts.    [facility] Manager  [facility]  O: [phone]  C: [phone]

## 2021-05-13 ENCOUNTER — Telehealth: Payer: Self-pay | Admitting: Internal Medicine

## 2021-05-13 ENCOUNTER — Emergency Department
Admission: EM | Admit: 2021-05-13 | Discharge: 2021-05-14 | Discharge status: Against Medical Advice | Payer: Medicaid (Managed Care) | Source: Ambulatory Visit

## 2021-05-13 ENCOUNTER — Other Ambulatory Visit: Payer: Self-pay

## 2021-05-13 NOTE — Telephone Encounter (Signed)
Copied from [place] [id number]. Topic: Access to Care - Speak to Provider/Office Staff  >> May 13, 2021  2:28 PM Teshawn Moan, Caren Griffins wrote:  Jenna [name] calling to report that she is experiencing painful tingling in hand and forearm. These symptoms have been going on for 3 day(s)    Patient requesting same day appointment? yes  Patient requesting call back? yes    Phone number confirmed at [phone]

## 2021-05-13 NOTE — ED Triage Notes (Signed)
Pt c/o 3 days right hand and forearm tingling, unable make a fist and right sided RLE weakness. Pt also c/o tingling on lips, hoarse voice and word finding difficulties. Pt called PCP and spoke to the nurse and was told to come here. Provider evaluating at triage.

## 2021-05-13 NOTE — Telephone Encounter (Signed)
Spoke with patient and significant other.     Patient reports:   Sx onset: 3 days ago  -right painful tingling in hand and forearm; unable to make a fist with the right hand  -unilateral weakness (right side); this is in right arm and right leg  -Tingling on lips  -Voice has changed  -Patient having word-finding difficulties    - patient was able to articulate words on the phone, but was stating it has been difficult to come up with the words    -Patient had SOB 2 days ago, but no longer having this issue    Patient denies:  -Difficulty breathing   Advised patient to immediately report to the ED for concern over s/sx of stroke. Writer offered to call 911, but patient and SO declined this, they are preferring to call a cab to get her to [facility] ED. The patient will be getting to the ED in the next 70min per SO. Patient agrees with plan.       Called [facility] nurse at [phone] to give report.

## 2021-05-13 NOTE — ED Notes (Signed)
05/13/21 1552   Expected Patient   Date of notification 05/13/21   Time Notified 1552   Notified by Office/PCP  [facility])   ED Service Adult Call-in   Pt Info note/Reason for sending x3 days pt endorsing R hand/forearm tingling weakness. Weakness has progressed to R leg, R-side of face. Endorsing Lip tingling, voice "coarse," "word-finding difficulties." SOB 2 days ago (since resolved)   Patient coming from Home   Expected Call-In Information   Requested Evaluation By Adult ED, Neuro?   Report called to See call in note, Ambulatory Triage.

## 2021-05-14 ENCOUNTER — Other Ambulatory Visit: Payer: Self-pay

## 2021-05-18 ENCOUNTER — Telehealth: Payer: Self-pay | Admitting: Internal Medicine

## 2021-05-18 ENCOUNTER — Encounter: Payer: Self-pay | Admitting: Gastroenterology

## 2021-05-18 NOTE — Telephone Encounter (Signed)
Called and spoke to patient. Patient is scheduled for an appointment on 3/9 with Sae, NP.

## 2021-05-18 NOTE — Telephone Encounter (Signed)
Copied from CRM #[id number]. Topic: Access to Care - Speak to Provider/Office Staff  >> May 18, 2021 10:44 AM Jenna [name] wrote:  [name] calling to report that she is experiencing foaming out of the mouth in sleep, tingling sensation in right arm, finger tips and toes going numb. These symptoms have been going on for 4 month(s)    Patient requesting same day appointment? No tomorrow would be better.  Patient requesting call back? yes    Phone number confirmed at [phone]

## 2021-05-18 NOTE — Progress Notes (Signed)
Pt requested apartment listings to be sent via email. CM provided DHS apartment listings through email; pt thanked CM for the assistance.    [facility] Manager  [facility]  O: [phone]  C: [phone]

## 2021-05-19 ENCOUNTER — Other Ambulatory Visit: Payer: Self-pay

## 2021-05-21 ENCOUNTER — Ambulatory Visit: Payer: Medicaid (Managed Care) | Admitting: Adult Health

## 2021-05-22 ENCOUNTER — Encounter: Payer: Self-pay | Admitting: Internal Medicine

## 2021-05-26 ENCOUNTER — Encounter: Payer: Self-pay | Admitting: Gastroenterology

## 2021-05-26 NOTE — Progress Notes (Signed)
Pt requested help to pick up food from [facility] center; CM picked up food donation and delivered directly to pt at her temporary address. Pt thanked CM for the assistance.    [facility]  O: [phone]  C: [phone]

## 2021-05-31 ENCOUNTER — Encounter (HOSPITAL_COMMUNITY): Payer: Self-pay

## 2021-05-31 ENCOUNTER — Emergency Department (HOSPITAL_COMMUNITY)
Admission: EM | Admit: 2021-05-31 | Discharge: 2021-05-31 | Disposition: A | Discharge status: Home / Self Care | Payer: 59 | Attending: Emergency Medicine | Admitting: Emergency Medicine

## 2021-05-31 ENCOUNTER — Emergency Department (HOSPITAL_COMMUNITY): Payer: 59

## 2021-05-31 ENCOUNTER — Other Ambulatory Visit: Payer: Self-pay

## 2021-05-31 DIAGNOSIS — R42 Dizziness and giddiness: Secondary | ICD-10-CM | POA: Diagnosis not present

## 2021-05-31 DIAGNOSIS — R109 Unspecified abdominal pain: Secondary | ICD-10-CM | POA: Insufficient documentation

## 2021-05-31 DIAGNOSIS — F419 Anxiety disorder, unspecified: Secondary | ICD-10-CM | POA: Insufficient documentation

## 2021-05-31 LAB — URINALYSIS, ROUTINE W REFLEX MICROSCOPIC
Bilirubin Urine: NEGATIVE
Glucose, UA: NEGATIVE mg/dL
Hgb urine dipstick: NEGATIVE
Ketones, ur: 15 mg/dL — AB
Leukocytes,Ua: NEGATIVE
Nitrite: NEGATIVE
Protein, ur: NEGATIVE mg/dL
Specific Gravity, Urine: <1.005 — ABNORMAL LOW (ref 1.005–1.030)
pH: 6 (ref 5.0–8.0)

## 2021-05-31 LAB — CBC WITH DIFFERENTIAL/PLATELET
Abs Immature Granulocytes: 0.04 10*3/uL (ref 0.00–0.07)
Basophils Absolute: 0 10*3/uL (ref 0.0–0.1)
Basophils Relative: 0 %
Eosinophils Absolute: 0 10*3/uL (ref 0.0–0.5)
Eosinophils Relative: 0 %
HCT: 44.3 % (ref 36.0–46.0)
Hemoglobin: 14.7 g/dL (ref 12.0–15.0)
Immature Granulocytes: 0 %
Lymphocytes Relative: 26 %
Lymphs Abs: 2.7 10*3/uL (ref 0.7–4.0)
MCH: 28.5 pg (ref 26.0–34.0)
MCHC: 33.2 g/dL (ref 30.0–36.0)
MCV: 85.9 fL (ref 80.0–100.0)
Monocytes Absolute: 0.7 10*3/uL (ref 0.1–1.0)
Monocytes Relative: 6 %
Neutro Abs: 6.8 10*3/uL (ref 1.7–7.7)
Neutrophils Relative %: 68 %
Platelets: 462 10*3/uL — ABNORMAL HIGH (ref 150–400)
RBC: 5.16 MIL/uL — ABNORMAL HIGH (ref 3.87–5.11)
RDW: 12.5 % (ref 11.5–15.5)
WBC: 10.3 10*3/uL (ref 4.0–10.5)
nRBC: 0 % (ref 0.0–0.2)

## 2021-05-31 LAB — COMPREHENSIVE METABOLIC PANEL
ALT: 24 U/L (ref 0–44)
AST: 22 U/L (ref 15–41)
Albumin: 4.7 g/dL (ref 3.5–5.0)
Alkaline Phosphatase: 65 U/L (ref 38–126)
Anion gap: 11 (ref 5–15)
BUN: 7 mg/dL (ref 6–20)
CO2: 23 mmol/L (ref 22–32)
Calcium: 9.8 mg/dL (ref 8.9–10.3)
Chloride: 102 mmol/L (ref 98–111)
Creatinine, Ser: 0.78 mg/dL (ref 0.44–1.00)
GFR, Estimated: >60 mL/min (ref >60)
Glucose, Bld: 114 mg/dL — ABNORMAL HIGH (ref 70–99)
Potassium: 3.7 mmol/L (ref 3.5–5.1)
Sodium: 136 mmol/L (ref 135–145)
Total Bilirubin: 0.5 mg/dL (ref 0.3–1.2)
Total Protein: 8.5 g/dL — ABNORMAL HIGH (ref 6.5–8.1)

## 2021-05-31 LAB — TROPONIN I (HIGH SENSITIVITY): Troponin I (High Sensitivity): <2 ng/L (ref <18)

## 2021-05-31 LAB — PREGNANCY, URINE: Preg Test, Ur: NEGATIVE

## 2021-05-31 LAB — TSH: TSH: 1.006 u[IU]/mL (ref 0.350–4.500)

## 2021-05-31 MED ORDER — HYDROXYZINE HCL 25 MG PO TABS: 50.0000 mg | ORAL_TABLET | Freq: Once | ORAL | Status: DC

## 2021-05-31 MED ORDER — ALPRAZOLAM 0.5 MG PO TABS: 0.5000 mg | ORAL_TABLET | Freq: Three times a day (TID) | ORAL | 0 refills | Status: DC | PRN

## 2021-05-31 NOTE — ED Provider Triage Note (Signed)
Emergency Medicine Provider Triage Evaluation Note ? ?Samantha Nguyen , a 36 y.o. female  was evaluated in triage.  Pt complains of panic attacks with chest pain/tightness and lightheadedness for the past two weeks. Placed on metoprolol recently by her PCP. She reports she was feeling lightheaded, no room spinning sensation. She reports she kept feeling her heart racing but would check her heart rate and it would be fine. She reports she could just feel it beating. Tried taking benadryl to sleep. She reports tingling in her fingers as well. Panic attacks since she was a teenager, on Paxil. Abdominal pain, "feels like its in knots". Denies any melena. Reports recent stressors. Scared to sleep and eat. ? ?Review of Systems  ?Positive: Chest pain/tightness, lightheadedness, panic attacks,abdominal pain  ?Negative: Melena, syncope ? ?Physical Exam  ?BP 135/88 (BP Location: Left Arm)   Pulse 99   Temp 97.8 ?F (36.6 ?C) (Oral)   Resp 16   Ht 5\' 7"  (1.702 m)   Wt 78.9 kg   LMP 05/21/2021 (Exact Date)   SpO2 100%   BMI 27.25 kg/m?  ?Gen:   Awake, no distress   ?Resp:  Normal effort  ?MSK:   Moves extremities without difficulty  ?Other:  Abdomen soft, non-distended, non-tender.  ? ?Medical Decision Making  ?Medically screening exam initiated at 12:12 PM.  Appropriate orders placed.  Samantha Nguyen was informed that the remainder of the evaluation will be completed by another provider, this initial triage assessment does not replace that evaluation, and the importance of remaining in the ED until their evaluation is complete. ? ?Labs and imaging ordered.  ?  ?Samantha Puller, PA-C ?05/31/21 1219 ? ?

## 2021-05-31 NOTE — ED Triage Notes (Addendum)
Patient reports dizziness since this AM. Patient states she has been having anxiety x 2 week. Patient was prescribed Metoprolol for  tachycardia.  ?Patient also c/o abdominal pain that she rates 3/10 and states it started when she began having anxiety. ?

## 2021-05-31 NOTE — ED Provider Notes (Signed)
?[facility]-EMERGENCY DEPT ?Provider Note ? ? ?CSN: [id number] ?Arrival date & time: 05/31/21  1033 ? ?  ? ?History ? ?Chief Complaint  ?Patient presents with  ? Dizziness  ? Abdominal Pain  ? ? ?Samantha Nguyen is a 36 y.o. female. ? ?Patient with a history anxiety.  Her doctor placed her on metoprolol and she is taking it for 2 days but it is made her feel bad.  She has also been tried on BuSpar and Vistaril.  Her doctor did not want to start Xanax until she saw a psychiatrist.  Patient is also on Paxil 10 mg a day and it should be increased to 20 and 6 days ? ?The history is provided by the patient and medical records. No language interpreter was used.  ?Dizziness ?Quality:  Lightheadedness ?Severity:  Mild ?Onset quality:  Sudden ?Timing:  Intermittent ?Progression:  Resolved ?Chronicity:  New ?Context: not when bending over   ?Relieved by:  Nothing ?Worsened by:  Nothing ?Ineffective treatments:  None tried ?Associated symptoms: no blood in stool, no chest pain, no diarrhea and no headaches   ?Abdominal Pain ?Associated symptoms: no chest pain, no cough, no diarrhea, no fatigue and no hematuria   ? ?  ? ?Home Medications ?Prior to Admission medications   ?Medication Sig Start Date End Date Taking? Authorizing Provider  ?ALPRAZolam (XANAX) 0.5 MG tablet Take 1 tablet (0.5 mg total) by mouth 3 (three) times daily as needed for anxiety. 05/31/21  Yes Bethann Berkshire, MD  ?acetaminophen (TYLENOL) 500 MG tablet Take 500 mg by mouth every 6 (six) hours as needed for fever.    [provider]  ?amoxicillin (AMOXIL) 500 MG capsule Take 2 capsules (1,000 mg total) by mouth 2 (two) times daily. 11/08/16   Mardella Layman, MD  ?Amoxicillin-Pot Clavulanate (AUGMENTIN PO) Take by mouth.    [provider]  ?cetirizine (ZYRTEC) 10 MG tablet Take 10 mg by mouth daily.    [provider]  ?Cholecalciferol (VITAMIN D3) 5000 units TABS Take 1 tablet by mouth daily.    [provider]  ?Cranberry 425 MG CAPS Take 1 capsule by mouth daily.    [provider]  ?diclofenac (VOLTAREN) 75 MG EC tablet Take 1 tablet (75 mg total) by mouth 2 (two) times daily as needed. Do not take until finished with steroid taper 10/22/20   Cristie Hem, PA-C  ?fluticasone (FLONASE) 50 MCG/ACT nasal spray Place 1 spray into both nostrils daily. 05/08/17   Georgetta Haber, NP  ?ibuprofen (ADVIL,MOTRIN) 200 MG tablet Take 200 mg by mouth every 6 (six) hours as needed for fever or mild pain.    [provider]  ?Multiple Vitamins-Minerals (MULTIVITAMIN ADULT) TABS Take 1 tablet by mouth daily.    [provider]  ?oxyCODONE-acetaminophen (PERCOCET/ROXICET) 5-325 MG tablet Take 1-2 tablets by mouth every 4 (four) hours as needed for moderate pain. 09/09/16   McKenzie, Mardene Celeste, MD  ?PARoxetine (PAXIL) 30 MG tablet Take 30 mg by mouth daily.    [provider]  ?predniSONE (STERAPRED UNI-PAK 21 TAB) 10 MG (21) TBPK tablet Take as directed 10/22/20   Cristie Hem, PA-C  ?tamsulosin (FLOMAX) 0.4 MG CAPS capsule Take 0.4 mg by mouth daily after supper.    [provider]  ?traMADol (ULTRAM) 50 MG tablet Take 50 mg by mouth every 6 (six) hours as needed for moderate pain.    [provider]  ?   ? ?Allergies    ?  Sulfa antibiotics   ? ?Review of Systems   ?Review of Systems  ?Constitutional:  Negative for appetite change and fatigue.  ?HENT:  Negative for congestion, ear discharge and sinus pressure.   ?Eyes:  Negative for discharge.  ?Respiratory:  Negative for cough.   ?Cardiovascular:  Negative for chest pain.  ?Gastrointestinal:  Positive for abdominal pain. Negative for blood in stool and diarrhea.  ?Genitourinary:  Negative for frequency and hematuria.  ?Musculoskeletal:  Negative for back pain.  ?Skin:  Negative for rash.  ?Neurological:  Positive for dizziness. Negative for seizures and headaches.  ?Psychiatric/Behavioral:  Negative for hallucinations.   ?      Anxiety  ? ?Physical Exam ?Updated Vital Signs ?BP (!) 140/96   Pulse 89   Temp 97.8 ?F (36.6 ?C) (Oral)   Resp (!) 21   Ht 5\' 7"  (1.702 m)   Wt 78.9 kg   LMP 05/21/2021 (Exact Date)   SpO2 100%   BMI 27.25 kg/m?  ?Physical Exam ?Vitals and nursing note reviewed.  ?Constitutional:   ?   Appearance: She is well-developed.  ?HENT:  ?   Head: Normocephalic.  ?   Nose: Nose normal.  ?Eyes:  ?   General: No scleral icterus. ?   Conjunctiva/sclera: Conjunctivae normal.  ?Neck:  ?   Thyroid: No thyromegaly.  ?Cardiovascular:  ?   Rate and Rhythm: Normal rate and regular rhythm.  ?   Heart sounds: No murmur heard. ?  No friction rub. No gallop.  ?Pulmonary:  ?   Breath sounds: No stridor. No wheezing or rales.  ?Chest:  ?   Chest wall: No tenderness.  ?Abdominal:  ?   General: There is no distension.  ?   Tenderness: There is no abdominal tenderness. There is no rebound.  ?Musculoskeletal:     ?   General: Normal range of motion.  ?   Cervical back: Neck supple.  ?Lymphadenopathy:  ?   Cervical: No cervical adenopathy.  ?Skin: ?   Findings: No erythema or rash.  ?Neurological:  ?   Mental Status: She is alert and oriented to person, place, and time.  ?   Motor: No abnormal muscle tone.  ?   Coordination: Coordination normal.  ?   Comments: Anxious  ?Psychiatric:     ?   Behavior: Behavior normal.  ? ? ?ED Results / Procedures / Treatments   ?Labs ?(all labs ordered are listed, but only abnormal results are displayed) ?Labs Reviewed  ?CBC WITH DIFFERENTIAL/PLATELET - Abnormal; Notable for the following components:  ?    Result Value  ? RBC 5.16 (*)   ? Platelets 462 (*)   ? All other components within normal limits  ?COMPREHENSIVE METABOLIC PANEL - Abnormal; Notable for the following components:  ? Glucose, Bld 114 (*)   ? Total Protein 8.5 (*)   ? All other components within normal limits  ?URINALYSIS, ROUTINE W REFLEX MICROSCOPIC - Abnormal; Notable for the following components:  ? Specific Gravity, Urine <1.005  (*)   ? Ketones, ur 15 (*)   ? All other components within normal limits  ?TSH  ?PREGNANCY, URINE  ?TROPONIN I (HIGH SENSITIVITY)  ? ? ?EKG ?None ? ?Radiology ?DG Chest 2 View ? ?Result Date: 05/31/2021 ?CLINICAL DATA:  Chest pain. Panic attacks with chest pain and tightness. EXAM: CHEST - 2 VIEW COMPARISON:  04/04/2016 FINDINGS: Cardiac silhouette and mediastinal contours are within normal limits. The lungs are clear. No pleural effusion or pneumothorax. No acute  skeletal abnormality. IMPRESSION: No active cardiopulmonary disease. Electronically Signed   By: Neita Garnetonald  Viola M.D.   On: 05/31/2021 12:29   ? ?Procedures ?Procedures  ? ? ?Medications Ordered in ED ?Medications - No data to display ? ?ED Course/ Medical Decision Making/ A&P ?  ?                        ?Medical Decision Making ?Risk ?Prescription drug management. ? ?This patient presents to the ED for concern of anxiety, this involves an extensive number of treatment options, and is a complaint that carries with it a high risk of complications and morbidity.  The differential diagnosis includes anxiety ? ? ?Co morbidities that complicate the patient evaluation ? ?History of anxiety ? ? ?Additional history obtained: ? ?Additional history obtained from patient ?External records from outside source obtained and reviewed including hospital records ? ? ?Lab Tests: ? ?I Ordered, and personally interpreted labs.  The pertinent results include: CBC and chemistries unremarkable ? ? ?Imaging Studies ordered: ? ?I ordered imaging studies including chest x-ray ?I independently visualized and interpreted imaging which showed negative ?I agree with the radiologist interpretation ? ? ?Cardiac Monitoring: / EKG: ? ?The patient was maintained on a cardiac monitor.  I personally viewed and interpreted the cardiac monitored which showed an underlying rhythm of: Normal sinus rhythm ? ? ?Consultations Obtained: ? ?No consult ? ?Problem List / ED Course / Critical interventions  / Medication management ? ?Anxiety ?I ordered medication including Xanax for anxiety ?Reevaluation of the patient after these medicines showed that the patient improved ?I have reviewed the patients home medi

## 2021-05-31 NOTE — Discharge Instructions (Signed)
Stop the metoprolol.  Follow back up with your family doctor  ?

## 2021-06-02 ENCOUNTER — Encounter: Payer: Self-pay | Admitting: Gastroenterology

## 2021-06-02 ENCOUNTER — Telehealth: Payer: Self-pay | Admitting: Internal Medicine

## 2021-06-02 ENCOUNTER — Other Ambulatory Visit: Payer: Self-pay

## 2021-06-02 NOTE — Progress Notes (Signed)
Patient reached out to covering CM today and requested assistance with setting up medical transportation for two last minute appointments. Patient needed to go to [facility] today and also has a [facility] appointment on 3/22. CM then contacted [employer] transportation who confirmed they would be able to accommodate both trips. CM then set up transportation and updated patient.     Jenna [name] is scheduled for a [facility] appointment on 06/02/2021 at 4:45pm.  At 3:45pm [employer] will pick up Jenna [name] at [place] and take them to [address], [place]  At 7:45pm [employer] will pick up Jenna [name] at [address] and take them to [employer], [place]  Jenna [name] may contact [employer] at [phone] for changes to pick-up and return times prior to this trip.    To ensure seamless access to your healthcare:  If the assigned Transportation Provider cannot accommodate your trip, MAS will reassign it to the next available provider  On the day of the trip, you may confirm your provider - call the automated MAS System & select option 'Confirm My Trip'      Jenna [name]'s Trip  Date: 06/02/2021 Time: 4:45pm Inv# [id number]    Jenna [name] is scheduled for a Primary Care Office appointment on 06/03/2021 at 3:45pm.  At 2:45pm [employer] will pick up Jenna [name] at [place] and take them to [place]  At 5:15pm [employer] will pick up Jenna [name] at [place] and take them to [employer], [place]  Jenna [name] may contact [employer] at [phone] for changes to pick-up and return times prior to this trip.    To ensure seamless access to your healthcare:  If the assigned Transportation Provider cannot accommodate your trip, MAS will reassign it to the next available provider  On the day of the trip, you  may confirm your provider - call the automated MAS System & select option 'Confirm My Trip'      Jenna [name] Trip  Date: 06/03/2021 Time: 3:45pm Inv# [id number]

## 2021-06-02 NOTE — Telephone Encounter (Signed)
Copied from CRM #[id number]. Topic: Access to Care - Speak to Provider/Office Staff  >> Jun 02, 2021  9:50 AM Elnita Maxwell wrote:  Ms. Thaden calling to report that she is experiencing inflamed under arm and under breast and cyst filled with pus.  These symptoms have been going on for 1 month(s)    Patient is requesting medication.  Patient has had this before.    Patient requesting same day appointment?   Patient requesting call back? yes    Phone number confirmed at Telephone Information:  Mobile          [phone]

## 2021-06-02 NOTE — Telephone Encounter (Signed)
Spoke with patient.       Patient reports:   Sx onset: 1 month  -cysts on breast that are draining, increasing in quantity, and increasing in size  -general malaise    Patient denies:   -fevers    Advised patient to be seen in [facility] on 3/22 to be evaluated for this; patient requested transport to be set up with either [employer] and needs to accomodate 5-6 extra people. Writer will reach out to home health care manager and patient agreed to this.     Advised pt to call in the meantime with questions or concerns, or to seek care with worsening symptoms. Patient verbalized understanding.

## 2021-06-03 ENCOUNTER — Ambulatory Visit: Payer: Medicaid (Managed Care) | Admitting: Student in an Organized Health Care Education/Training Program

## 2021-06-03 NOTE — Progress Notes (Incomplete)
[facility] Clinic Follow-up Note     Reason For Visit:   Rash    HPI:      Jenna [name] is 36 y.o. year old female coming in to discuss the following issues:          Assessment and Plan:   36 yo female with Pmhx of severe iron deficiency anemia with many barriers to care presenting for acute visit for rash on skin. Per chart review, last seen by Derm for HS, last in 2018. At that time, was prescribed bactrim, prednisone taper, and clinda lotion. Advised to return in 1 month      HCM:    Review of Systems:     ROS   12-point ROS negative except as noted above   Medications/Allergies/Immunizations:     Medication list reviewed and reconciled this visit in the EMR. Allergy list confirmed in the EMR.    Past Medical History/Family History/Social History:     Past Medical History, Family History, and Social History reviewed, confirmed, and updated as appropriate this visit in the EMR.     Physical Exam:     There were no vitals taken for this visit.    BP Readings from Last 3 Encounters:   02/17/21 143/67   12/04/20 140/86   11/21/20 143/64     Wt Readings from Last 3 Encounters:   04/20/21 (!) 150.4 kg (331 lb 9.6 oz)   03/18/21 (!) 147 kg (324 lb)   02/11/21 (!) 147.1 kg (324 lb 6.4 oz)       Vitals Signs: SLH:TDSKA is no height or weight on file to calculate BMI., otherwise as above, reviewed with patient    General: Pleasant, humorous, female appears comfortable and in NAD  HEENT: Normocephalic/atraumatic. Hearing intact to normal conversational tones  Neck:  Supple with full AROM without pain.   Cardiovascular: Regular rate, regular rhythm, no murmurs, rubs or gallops.   Resp: CTAB. No w/r/r. Normal work of breathing.   Abdominal: Soft. Non-tender, non-distended, normoactive bowel sounds.   Extremity: No edema. Lower extremities appear symmetric.   Skin: No rashes  Neuro: A&O x 3.  moves all 4 extremities equally, strength grossly intact. Normal gait and speech. No abnormal movements noted. No  tremors seen.  Psych: Affect appropriate. Speech is not pressured.     Labs and Imaging:   Labs and Imaging results reviewed with [name] .     Follow-up:     RTC:  ***    Signed by:   [name], MD  PGY-3 Department of Internal Medicine   06/03/2021 8:46 AM

## 2021-06-04 ENCOUNTER — Encounter: Payer: Self-pay | Admitting: Internal Medicine

## 2021-06-08 ENCOUNTER — Other Ambulatory Visit: Payer: Self-pay

## 2021-06-08 NOTE — Progress Notes (Signed)
Clinical Care Management: writer has been advised that patient has missed their 3rd appointment so far in this calendar year, 2023. These appointments were scheduled for 04/24/21, 05/21/21 & 06/03/21. Chart reviewed to assess for any corresponding hospitalizations or other information associated with these dates.

## 2021-06-15 ENCOUNTER — Telehealth: Payer: Self-pay | Admitting: Internal Medicine

## 2021-06-15 ENCOUNTER — Other Ambulatory Visit: Payer: Self-pay

## 2021-06-15 NOTE — Progress Notes (Signed)
[facility] Clinic Note     Reason For Visit:   Fatigue, Palpitations, Shortness of Breath, and Other (Hidradenitis Suppurativa)     HPI:     Jenna [name] is presenting to clinic today for the following:    Fatigue, confusion, palpitations, dyspnea on exertion, HTN:  - All sx have been going on for months, but worsening gradually in the past 1 week  - Similar sx reported at last CCP visit in Nov 2022  - Feels like her heart is stopping occasionally  - Reports near syncope, dyspnea on exertion (after ~20 feet), and inability to stay awake during the day, all of which occur every day  - Continues to require multiple pillows for sleeping  - Hx severe IDA from menorrhagia (last tsat 3%, iron 18, TIBC 579), received iron infusions last year, Hgb frequently near transfusion threshold  - Echo 03/2021 w/ dilated LV and LVH, normal LVEF, no WMAs, biatrial enlargement, normal R sided structure and funciton, though unable to assess PA pressure    OSA:  - Partner reports the pt stops breathing at night, then jolts awake  - Has been referred for sleep study previously, but didn't go  - Neck circumference 17in    Hydradenitis suppurativa:  - Most prominent on armpits and groin  - Gotten Augmentin or doxy before  - Topical clindamycin burns so she is requesting oral  - Had them lanced in ED before  - No fevers or chills    Social/Food Vouchers/Transportation:  - Food stamps come through on the 9th, requesting [employer] vouchers to bridge her there  - Doesn't like the food pantry as it's not as easy as the cafe meals  - Currently living in a hotel with her partner and 4 of her children, all of whom came to the visit today in medicaid transportation  - Transportation is refusing to take them back due to the children not being properly restrained and "out of control"     Review of Systems:     Reviewed. Pertinent positives and negatives as per HPI.    Past Medical History/Family History/Social History:     Past Medical  History, Family History, and Social History reviewed, confirmed, and updated as appropriate this visit in the EMR.     Medications/Allergies/Immunizations:     Medication list reviewed and reconciled this visit in the EMR. Allergy list confirmed in the EMR.    Current Outpatient Medications   Medication Instructions    Acetaminophen Extra Strength 1,000 mg, Oral, 3 TIMES DAILY PRN    albuterol HFA (PROVENTIL, VENTOLIN, PROAIR HFA) 108 (90 Base) MCG/ACT inhaler 1-2 puffs, Inhalation, EVERY 6 HOURS PRN, Shake well before each use.    amLODIPine (NORVASC) 10 mg, Oral, DAILY    blood pressure monitor Use as directed    budesonide-formoterol (SYMBICORT) 160-4.5 MCG/ACT inhaler Inhale 2 puffs by mouth into the lungs 2 times daily  Shake well before each use.    doxycycline hyclate (VIBRA-TABS) 100 mg, Oral, EVERY 12 HOURS    guaiFENesin-dextromethorphan (ROBITUSSIN DM) 100-10 mg/65mL syrup 5 mLs, Oral, 3 TIMES DAILY PRN    Meds & Orders Exist in Blood Admin Plan No dose, route, or frequency recorded.    nicotine (NICODERM CQ) 7 MG/24HR patch 1 patch, Transdermal, DAILY        Physical Exam:     Vitals:    06/17/21 1416   BP: (!) 154/94   BP Location: Right arm   Pulse: 96  Temp: 35 C (95 F)   TempSrc: Temporal   SpO2: 100%   Weight: (!) 152.2 kg (335 lb 9.6 oz)   Height: 1.651 m (5\' 5" )      BP Readings from Last 3 Encounters:   02/17/21 143/67   12/04/20 140/86   11/21/20 143/64     Wt Readings from Last 3 Encounters:   04/20/21 (!) 150.4 kg (331 lb 9.6 oz)   03/18/21 (!) 147 kg (324 lb)   02/11/21 (!) 147.1 kg (324 lb 6.4 oz)     Body mass index is 55.85 kg/m.    Physical Exam  Vitals reviewed.   Constitutional:       General: She is not in acute distress.     Appearance: She is morbidly obese.   HENT:      Head: Normocephalic and atraumatic.      Mouth/Throat:      Mouth: Mucous membranes are moist.      Pharynx: Oropharynx is clear.   Cardiovascular:      Rate and Rhythm: Normal rate and regular rhythm.       Heart sounds: No murmur heard.     No friction rub. No gallop.   Pulmonary:      Effort: Pulmonary effort is normal. No respiratory distress.      Breath sounds: Normal breath sounds. No stridor. No wheezing, rhonchi or rales.   Abdominal:      General: There is no distension.      Palpations: Abdomen is soft.   Musculoskeletal:      Right lower leg: No edema.      Left lower leg: No edema.   Skin:     General: Skin is warm and dry.      Coloration: Skin is not jaundiced.   Neurological:      Mental Status: She is alert.   Psychiatric:         Mood and Affect: Mood is anxious. Affect is angry and tearful.      Comments: Patient frequently having to redirect her children in the room, becoming tearful at times, seems exasperated with their behavior        Assessment & Plan:     [name] is 36 y.o. female with a PMH of HTN, asthma, and class III obesity, who presents to clinic for a routine visit. Plan as follows:    Fatigue, confusion, palpitations, dyspnea on exertion, HTN:  I have very high suspicion this is 2/2 OSA. She has risk factors and nocturnal apnea consistent with the disease. She has been referred for a sleep study previously but no showed. Could also be a component of known blood loss anemia as she is frequently near transfusion threshold. Also consider asthma (though no wheezing on exam) and primary cardiac etiology.   - Office EKG w/ LVH, new T-wave inversions in II & aVf, no ST changes   - CBC, CMP, Mg, TSH/fT4, iron studies  - Stress echo ordered  - 48h ePatch ordered  - Sleep medicine referral for sleep study    HTN:  - Elevated in the office (154/94 on my manual check mid-visit), regularly >140s/60s-80s  - Increase amlodipine to 10mg  daily    Hydradenitis suppurativa:  - Declining topical clindamycin due to burning  - Doxycycline 100mg  BID for 30d, would not extend this without a repeat office visit to assess for improvement    Food Vouchers, social concerns:  - Emergency food voucher order  placed; we do  not provide [employer] vouchers  - Pt came with her 4 children and partner, though transportation unwilling to take them back due to the children being very difficult to control, unbelted, and non-redirectable in the [place], SW assisting with this    Follow up: 1 month with CCP Dr. Janan Halter    Stark Bray, MD  PGY1 Internal Medicine

## 2021-06-15 NOTE — Telephone Encounter (Signed)
Copied from CRM #[id number]. Topic: Access to Care - Speak to Provider/Office Staff  >> Jun 15, 2021 12:39 PM Uvaldo Bristle wrote:  Patient called and reported the following symptoms shortness of breath, heart palpitations, fatigue for about 1 week.     Patient's phone number is [phone]    Urgent call was transferred to [name] at the [facility] office.     Patient stated when she wakes up in the morning she ends up going back to sleep for most of the day. The patient stated she drank 4 cups of strong coffee and still wasn't able to stay awake. The patient stated it is putting a strain on her partner as they have young children and he has to take care of everything.

## 2021-06-15 NOTE — Telephone Encounter (Signed)
Warm transfer.    Patient states she just feels "out of it". Can't stay awake, feels her heart is fluttering. Finds herself gasping for air upon waking.    Feels guilty because her partner has to do all the cooking and cleaning at her home.    Patient did become weepy during call.    Gave patient the option of going to the ED for an evaluation or scheduling an appointment.    Patient refused ED, would like appointment.    Scheduled with transportation    Aneta Hendershott is scheduled for a Primary Care Office appointment on 06/17/2021 at 2:00pm.  At 1:00pm [employer] will pick up [name] at [address], [place] and take them to [address] [place], [place]  At 3:30pm [employer] will pick up [name] at [address], [place] and take them to [address], [facility]  Haliyah Fryman may contact [employer] at [phone] for changes to pick-up and return times prior to this trip.

## 2021-06-17 ENCOUNTER — Other Ambulatory Visit: Payer: Self-pay

## 2021-06-17 ENCOUNTER — Ambulatory Visit: Payer: Medicaid (Managed Care) | Attending: Internal Medicine

## 2021-06-17 ENCOUNTER — Encounter: Payer: Self-pay | Admitting: Gastroenterology

## 2021-06-17 VITALS — BP 154/94 | HR 96 | Temp 95.0°F | Ht 65.0 in | Wt 335.6 lb

## 2021-06-17 DIAGNOSIS — R079 Chest pain, unspecified: Secondary | ICD-10-CM | POA: Insufficient documentation

## 2021-06-17 DIAGNOSIS — R5383 Other fatigue: Secondary | ICD-10-CM | POA: Insufficient documentation

## 2021-06-17 DIAGNOSIS — I10 Essential (primary) hypertension: Secondary | ICD-10-CM | POA: Insufficient documentation

## 2021-06-17 DIAGNOSIS — R9431 Abnormal electrocardiogram [ECG] [EKG]: Secondary | ICD-10-CM | POA: Insufficient documentation

## 2021-06-17 DIAGNOSIS — G4730 Sleep apnea, unspecified: Secondary | ICD-10-CM

## 2021-06-17 DIAGNOSIS — I517 Cardiomegaly: Secondary | ICD-10-CM | POA: Insufficient documentation

## 2021-06-17 DIAGNOSIS — Z789 Other specified health status: Secondary | ICD-10-CM

## 2021-06-17 DIAGNOSIS — Z5941 Food insecurity: Secondary | ICD-10-CM | POA: Insufficient documentation

## 2021-06-17 DIAGNOSIS — R0600 Dyspnea, unspecified: Secondary | ICD-10-CM | POA: Insufficient documentation

## 2021-06-17 LAB — COMPREHENSIVE METABOLIC PANEL
ALT: 18 U/L (ref 0–35)
AST: 16 U/L (ref 0–35)
Albumin: 4.4 g/dL (ref 3.5–5.2)
Alk Phos: 114 U/L — ABNORMAL HIGH (ref 35–105)
Anion Gap: 12 (ref 7–16)
Bilirubin,Total: <0.2 mg/dL (ref 0.0–1.2)
CO2: 28 mmol/L (ref 20–28)
Calcium: 9.9 mg/dL (ref 8.8–10.2)
Chloride: 101 mmol/L (ref 96–108)
Creatinine: 1.04 mg/dL — ABNORMAL HIGH (ref 0.51–0.95)
Glucose: 109 mg/dL — ABNORMAL HIGH (ref 60–99)
Lab: 16 mg/dL (ref 6–20)
Potassium: 5 mmol/L (ref 3.3–5.1)
Sodium: 141 mmol/L (ref 133–145)
Total Protein: 7.2 g/dL (ref 6.3–7.7)
eGFR BY CREAT: 72 *

## 2021-06-17 LAB — CBC
Hematocrit: 29 % — ABNORMAL LOW (ref 34–45)
Hemoglobin: 7.3 g/dL — ABNORMAL LOW (ref 11.2–15.7)
MCH: 20 pg — ABNORMAL LOW (ref 26–32)
MCHC: 26 g/dL — ABNORMAL LOW (ref 32–36)
MCV: 77 fL — ABNORMAL LOW (ref 79–95)
Platelets: 402 10*3/uL — ABNORMAL HIGH (ref 160–370)
RBC: 3.7 MIL/uL — ABNORMAL LOW (ref 3.9–5.2)
RDW: 18.3 % — ABNORMAL HIGH (ref 11.7–14.4)
WBC: 10.9 10*3/uL — ABNORMAL HIGH (ref 4.0–10.0)

## 2021-06-17 LAB — T4, FREE: Free T4: 1.7 ng/dL (ref 0.9–1.7)

## 2021-06-17 LAB — MAGNESIUM: Magnesium: 2 mg/dL (ref 1.6–2.5)

## 2021-06-17 LAB — TSH: TSH: 1.68 u[IU]/mL (ref 0.27–4.20)

## 2021-06-17 MED ORDER — AMLODIPINE BESYLATE 5 MG PO TABS *I*
10.0000 mg | ORAL_TABLET | Freq: Every day | ORAL | 1 refills | Status: DC
Start: 2021-06-17 — End: 2021-07-30
  Filled 2021-06-17: qty 90, 45d supply, fill #0

## 2021-06-17 MED ORDER — DOXYCYCLINE HYCLATE 100 MG PO TABS *I*
100.0000 mg | ORAL_TABLET | Freq: Two times a day (BID) | ORAL | 0 refills | Status: DC
Start: 2021-06-17 — End: 2023-06-30
  Filled 2021-06-17: qty 60, 30d supply, fill #0

## 2021-06-17 NOTE — Patient Instructions (Addendum)
-   You will get a call to schedule a sleep study. It is very important to make that appointment, as I believe sleep apnea is causing a lot of the issues that you are reporting,  - Please get your blood work done today in the lab downstairs (by [facility])  - You will get a call about the heart patch (monitor) that I ordered. You will get this put on at a cardiology office that they will specify  - You can pick up your higher dose of amlodipine and your doxycycline at the [facility] today

## 2021-06-17 NOTE — Progress Notes (Signed)
Pt called to request help with food and possible [employer] donations. CM called [facility] and placed referral for pickup on 4/6. CM LVM for [facility] regarding [facility].    [facility] Manager  [facility]  O: [phone]  C: [phone]

## 2021-06-18 ENCOUNTER — Other Ambulatory Visit: Payer: Self-pay

## 2021-06-18 ENCOUNTER — Telehealth: Payer: Self-pay

## 2021-06-18 LAB — EKG 12-LEAD
P: 96 deg
PR: 158 ms
QRS: 7 deg
QRSD: 93 ms
QT: 358 ms
QTc: 420 ms
Rate: 83 {beats}/min
T: -77 deg

## 2021-06-18 NOTE — Telephone Encounter (Signed)
Demisha Lahti  [id number]  Extended Stay  [place] [id number]     REFERRAL FROM:   [facility]   PRESENTING PROBLEM:               Concern for children's welfare  ADDITIONAL INFORMATION / ACTION TAKEN:               Patient arrived to clinic for a  [facility] visit(illness visit) with her husband and four children accompanying her               Writer was approached by multiple clinic staff persons who expressed concern about how patient's husband was interacting with the children and as a result writer placed a call to the [place] @ [phone]. Referral was accepted with Case ID# [id number] being assigned      This note has been shared with the [facility] team. Additionally our embedded [place] and her supervisor along with the clinic's Nurse Manager have been notified.

## 2021-06-24 ENCOUNTER — Encounter: Payer: Self-pay | Admitting: Gastroenterology

## 2021-06-24 NOTE — Progress Notes (Signed)
CM picked up food for pt from [employer]. Food donation was brought directly to pt's residence by CM.     [facility] Manager  [facility]  O: [phone]  C: [phone]

## 2021-06-26 NOTE — Progress Notes (Signed)
Not picked up/delivered

## 2021-06-29 ENCOUNTER — Telehealth: Payer: Self-pay

## 2021-06-29 NOTE — Progress Notes (Signed)
Clinical Care Management: [facility] reviewing NS process, CM currently without involvement in process. Closing encounter.

## 2021-06-29 NOTE — Telephone Encounter (Signed)
Good afternoon, we have made attempts to schedule a stress echo and ePatch monitor for this pt. She has not responded to our calls. We would be happy to schedule her at any time.     Thank you,   Lockie Pares at [facility]

## 2021-06-30 ENCOUNTER — Telehealth: Payer: Self-pay | Admitting: Internal Medicine

## 2021-06-30 NOTE — Telephone Encounter (Signed)
Copied from CRM [id number]. Topic: Access to Care - Speak to Provider/Office Staff  >> Jun 30, 2021 11:11 AM Uvaldo Bristle wrote:  Jenna [name] calling to report that she is experiencing back pain. These symptoms have been going on for 2-3 day(s)    Patient requesting same day appointment? yes  Patient requesting call back? yes    Phone number confirmed at [phone]    Patient is calling as she thinks she might have slipped a disk. Patient stated she lays flat on her back she is ok, but when she is up and moving or lays on her side the pain it really bad. Patient stated she does a lot of lifting with her kids and that may have caused it. The patient stated she has been having trouble with her spine.

## 2021-06-30 NOTE — Telephone Encounter (Signed)
Call to patient. No answer. Left message for patient to call back. Provided call back number.

## 2021-07-01 NOTE — Telephone Encounter (Signed)
Spoke with patient.       Patient reports:   Sx onset: 3 days ago; but this is a chronic pain just is worsening the past few days  -Tingling in fingers - chronic  -Patient's leg keeps giving out on her  -Lower back pain and hip pain; no new issues. This is a chronic issue that patient has discussed previously, but the pain goes through waves of intensity  -Has been using tylenol, heat, and ice with minimal improvement      Patient denies:   -No acute injury    Advised patient to be evaluated in clinic and patient was agreeable. She is able to come in on Monday 4/24. Scheduled patient on team to be evaluated  and patient agrees with plan. Advised that patient will need to schedule transportation as it is >3days from the appt. Patient was agreeable.       Advised pt to call in the meantime with questions or concerns, or to seek care with worsening symptoms. Patient verbalized understanding.

## 2021-07-02 ENCOUNTER — Other Ambulatory Visit: Payer: Self-pay

## 2021-07-06 ENCOUNTER — Ambulatory Visit: Payer: Medicaid (Managed Care) | Admitting: Student in an Organized Health Care Education/Training Program

## 2021-07-08 ENCOUNTER — Encounter: Payer: Self-pay | Admitting: Gastroenterology

## 2021-07-08 NOTE — Progress Notes (Signed)
Pt requested help to obtain assistance from community resources; CM transported pt to [facility] office and then to [employer] for gift card pickup. Pt was returned to her temporary home afterwards.    [facility] Manager  [facility]  O: [phone]  C: [phone]

## 2021-07-10 ENCOUNTER — Other Ambulatory Visit: Payer: Self-pay

## 2021-07-10 NOTE — Progress Notes (Signed)
Jenna [name] is scheduled for a Primary Care Office appointment on 07/30/2021 at 4:30pm.  At 3:30pm [employer] will pick up Jenna [name] at [address], [place] and take them to [address] [place]  At 6:00pm [employer] will pick up Jenna [name] at [address], [place] and take them to [employer], [place]  Jenna [name] may contact [employer] at [phone] for changes to pick-up and return times prior to this trip.  Inv# [id number]    Jenna [name] is scheduled for a Cardiology/Heart appointment on 08/07/2021 at 2:30am.  At 1:30am [facility] will pick up Jenna [name] at [address], [place] and take them to [address], [place]  At [employer] will pick up [name] at [employer], [place] and take them to [employer], [place]  Jenna [name] may contact [employer] at [phone] for changes to pick-up and return times prior to this trip.  Inv# [id number]    [facility] Manager  [facility]  O: [phone]  C: [phone]

## 2021-07-13 ENCOUNTER — Other Ambulatory Visit: Payer: Self-pay

## 2021-07-13 NOTE — Progress Notes (Signed)
Pt is asking about paperwork for her job; CM faxed paperwork on Friday. Pt worried that she won't get it to her employer by the end of the day. CM advised that paperwork may take longer than just a couple days and she will need to provide ample time for paperwork to be completed moving forward.    [facility] Manager  [facility]  O: [phone]  C: [phone]

## 2021-07-16 ENCOUNTER — Telehealth: Payer: Self-pay | Admitting: Internal Medicine

## 2021-07-16 NOTE — Telephone Encounter (Signed)
Copied from CRM #[id number]. Topic: Access to Care - Speak to Provider/Office Staff  >> Jul 16, 2021 11:09 AM Elnita Maxwell wrote:  Ms. Shanker calling to report that she is experiencing finger tips and toes are going numb.   These symptoms have been going on for 1 month(s)    Patient requesting same day appointment?   Patient requesting call back? yes    Phone number confirmed at Telephone Information:  Mobile          [phone]

## 2021-07-16 NOTE — Telephone Encounter (Signed)
Called patient and spoke with her about her symptoms. When writer first asked about how long the numbness has been going on she stated it just started, but then stated its been going on for several months. Patient was requesting the writer to tell her what it may be, Clinical research associate stated the provider would have to determine as there can be many causes.     Patient was already scheduled with CCP on 5/18, advise patient to come to this appt and this concern can be addressed then. Patient verbalized understanding.

## 2021-07-20 ENCOUNTER — Encounter: Payer: Self-pay | Admitting: Gastroenterology

## 2021-07-24 DIAGNOSIS — R69 Illness, unspecified: Secondary | ICD-10-CM | POA: Diagnosis not present

## 2021-07-24 DIAGNOSIS — F4323 Adjustment disorder with mixed anxiety and depressed mood: Secondary | ICD-10-CM | POA: Diagnosis not present

## 2021-07-29 ENCOUNTER — Telehealth: Payer: Self-pay | Admitting: Internal Medicine

## 2021-07-29 NOTE — Telephone Encounter (Signed)
Spoke to patient and reminded her that she is scheduled for an appointment at [facility] on 5/18 at 4:30 PM with her CCP. Pt verbalized understanding and confirmed that she will be attending her already scheduled visit. Encouraged pt to contact [facility] in the meantime if she feels that she will need a sooner visit.

## 2021-07-29 NOTE — Telephone Encounter (Signed)
Copied from CRM #[id number]. Topic: Access to Care - Speak to Provider/Office Staff  >> Jul 29, 2021 10:57 AM Tremaine Fuhriman B wrote:  Jenna, [name] calling to report that  she can't stop passing gas or burping aching burps every 2 seconds, stomach bloated, stomach pain, and nausea.     These symptoms have been going on for 2 day(s) no stop    Patient requesting same day appointment? yes  Patient requesting call back? yes    Phone number confirmed at  [phone] [facility])

## 2021-07-30 ENCOUNTER — Encounter: Payer: Self-pay | Admitting: Student in an Organized Health Care Education/Training Program

## 2021-07-30 ENCOUNTER — Other Ambulatory Visit: Payer: Self-pay

## 2021-07-30 ENCOUNTER — Ambulatory Visit
Payer: Medicaid (Managed Care) | Attending: Internal Medicine | Admitting: Student in an Organized Health Care Education/Training Program

## 2021-07-30 VITALS — HR 95 | Temp 96.8°F | Ht 65.0 in | Wt 337.0 lb

## 2021-07-30 DIAGNOSIS — D509 Iron deficiency anemia, unspecified: Secondary | ICD-10-CM | POA: Insufficient documentation

## 2021-07-30 DIAGNOSIS — J4550 Severe persistent asthma, uncomplicated: Secondary | ICD-10-CM | POA: Insufficient documentation

## 2021-07-30 DIAGNOSIS — G629 Polyneuropathy, unspecified: Secondary | ICD-10-CM | POA: Insufficient documentation

## 2021-07-30 DIAGNOSIS — I10 Essential (primary) hypertension: Secondary | ICD-10-CM | POA: Insufficient documentation

## 2021-07-30 LAB — COMPREHENSIVE METABOLIC PANEL
ALT: 22 U/L (ref 0–35)
AST: 20 U/L (ref 0–35)
Albumin: 4.3 g/dL (ref 3.5–5.2)
Alk Phos: 111 U/L — ABNORMAL HIGH (ref 35–105)
Anion Gap: 14 (ref 7–16)
Bilirubin,Total: <0.2 mg/dL (ref 0.0–1.2)
CO2: 25 mmol/L (ref 20–28)
Calcium: 9.2 mg/dL (ref 8.8–10.2)
Chloride: 103 mmol/L (ref 96–108)
Creatinine: 0.87 mg/dL (ref 0.51–0.95)
Glucose: 130 mg/dL — ABNORMAL HIGH (ref 60–99)
Lab: 17 mg/dL (ref 6–20)
Potassium: 4.7 mmol/L (ref 3.3–5.1)
Sodium: 142 mmol/L (ref 133–145)
Total Protein: 7.1 g/dL (ref 6.3–7.7)
eGFR BY CREAT: 89 *

## 2021-07-30 LAB — CBC AND DIFFERENTIAL
Baso # K/uL: 0.1 10*3/uL (ref 0.0–0.1)
Basophil %: 0.4 %
Eos # K/uL: 0.2 10*3/uL (ref 0.0–0.4)
Eosinophil %: 0.9 %
Hematocrit: 28 % — ABNORMAL LOW (ref 34–45)
Hemoglobin: 7.4 g/dL — ABNORMAL LOW (ref 11.2–15.7)
IMM Granulocytes #: 0.1 10*3/uL — ABNORMAL HIGH (ref 0.0–0.0)
IMM Granulocytes: 0.5 %
Lymph # K/uL: 2.4 10*3/uL (ref 1.2–3.7)
Lymphocyte %: 12.4 %
MCH: 20 pg — ABNORMAL LOW (ref 26–32)
MCHC: 26 g/dL — ABNORMAL LOW (ref 32–36)
MCV: 76 fL — ABNORMAL LOW (ref 79–95)
Mono # K/uL: 0.8 10*3/uL (ref 0.2–0.9)
Monocyte %: 3.9 %
Neut # K/uL: 15.9 10*3/uL — ABNORMAL HIGH (ref 1.6–6.1)
Nucl RBC # K/uL: 0 10*3/uL (ref 0.0–0.0)
Nucl RBC %: 0.1 /100 WBC (ref 0.0–0.2)
Platelets: 489 10*3/uL — ABNORMAL HIGH (ref 160–370)
RBC: 3.7 MIL/uL — ABNORMAL LOW (ref 3.9–5.2)
RDW: 18.7 % — ABNORMAL HIGH (ref 11.7–14.4)
Seg Neut %: 81.9 %
WBC: 19.4 10*3/uL — ABNORMAL HIGH (ref 4.0–10.0)

## 2021-07-30 LAB — TSH: TSH: 2.56 u[IU]/mL (ref 0.27–4.20)

## 2021-07-30 MED ORDER — BUDESONIDE-FORMOTEROL FUMARATE 160-4.5 MCG/ACT IN AERO *I*
2.0000 | INHALATION_SPRAY | Freq: Two times a day (BID) | RESPIRATORY_TRACT | 5 refills | Status: DC
Start: 2021-07-30 — End: 2022-05-19
  Filled 2021-07-30: qty 10.2, 30d supply, fill #0
  Filled 2021-11-10: qty 10.2, 30d supply, fill #1

## 2021-07-30 MED ORDER — AMLODIPINE BESYLATE 5 MG PO TABS *I*
10.0000 mg | ORAL_TABLET | Freq: Every day | ORAL | 1 refills | Status: DC
Start: 2021-07-30 — End: 2022-05-19
  Filled 2021-07-30: qty 90, 45d supply, fill #0

## 2021-07-30 MED ORDER — ALBUTEROL SULFATE HFA 108 (90 BASE) MCG/ACT IN AERS *I*
1.0000 | INHALATION_SPRAY | Freq: Four times a day (QID) | RESPIRATORY_TRACT | 3 refills | Status: DC | PRN
Start: 2021-07-30 — End: 2022-05-19
  Filled 2021-07-30: qty 18, 25d supply, fill #0
  Filled 2021-11-10: qty 18, 25d supply, fill #1

## 2021-07-30 NOTE — Patient Instructions (Addendum)
Please go to get your stress echocardiogram on 5/26 at 2:30 in the afternoon  [address] [facility]   [place] [address]    Please go to your sleep medicine clinic on 8/3 at 8:15am  [address]   [place] [address]

## 2021-07-30 NOTE — Progress Notes (Signed)
[facility] Follow-up Note     Reason For Visit:   Numbness and tingling in the hands and feet     HPI:      Jenna [name] is 36 y.o. year old female coming in to discuss the following issues:    Numbness and tingling  - reports no feeling in the tips of fingers and in the toes, occurring almost daily and will last for several days  - ongoing for several months, started with 2-3 fingers on the R hand, then L hand, and then to the toes  - feels like she is dropping things with hands  - will also have trouble making a fist and having pain going up the arms on both sides. Thought she may have a had a stroke when first happened and went to ED, but did not stay due to weight time.  - no gait disturbances or balance is off     Spine pain  - gets an "electrical sensation" in middle of spine and goes up to her back of neck, then neck starts jerking for 3 minutes  - occurs several times per month   - very painful, awake during episodes. No confusion afterwards   - unsure if any triggers  - does have back pain with movement     Weight   - uncomfortable at weight of 340lbs  - feels limited in her ability to play with children, get down and floor, and engage in activities with them  - wondering if she can see bariatric surgery to help with weight loss. Hoping for operation to resolve symptoms    HTN- requesting refill of meds   Asthma- out of meds. Requesting refill       Assessment and Plan:   Medically complex 36 yo with severe iron deficiency anemia, morbid obesity, HTN, asthma presenting for acute visit for chronic paresthesias in hands and feet. She continues to have multiple issues that we will slowly address as she returns to clinic. V    1. Paresthesias: does not totally fit stocking/glove neuropathy given onset of symptoms gradually and intermittent nature. Exam not consistent with a dermatomal distribution  - will initiate basic serologic workup: b12, tsh, RPR, HIV, CBC, CMP  - if  unrevealing, will refer to neurology for EMG/NCS    2. Spinal symptoms: unclear etiology- seems at least in part MSK due to provocation of pain with movement and tenderness to palpation of paraspinal musculature. No focal deficits on exam except for ?diminished sensation on R foot.  - will do workup for neuropathy as above  - counseled on weight loss, as this is at least in part contributing to symptoms  - can consider PT/chiropracter in future, though would be hesitant to burden with too many appts    3. Morbid obesity  - counseled on weight loss.   - will refer to bariatric surgery. She is aware this will require multiple visits, meeting with nutritionist, and compliance prior to even being considered for surgical intervention    4. Chronic iron deficiency anemia  - repeat CBC    5. Healthcare utilization  - advised of our clinic's new policy for no-shows. She was unaware and had not received letters we previously sent    6. Presumed OSA  - advised of sleep med appt in august and given information     7. Dyspnea  - advised of stress echo next week. Given information of where to go and when  8. HTN  - refilled amlodipine    9. Asthma  - refilled meds     HCM:  - deferred today. Will keep addressing   Review of Systems:     ROS   12-point ROS negative except as noted above   Medications/Allergies/Immunizations:     Medication list reviewed and reconciled this visit in the EMR. Allergy list confirmed in the EMR.    Past Medical History/Family History/Social History:     Past Medical History, Family History, and Social History reviewed, confirmed, and updated as appropriate this visit in the EMR.     Physical Exam:     Pulse 95   Temp 36 C (96.8 F) (Temporal)   Ht 1.651 m (5\' 5" )   Wt (!) 152.9 kg (337 lb)   SpO2 98%   BMI 56.08 kg/m     BP Readings from Last 3 Encounters:   06/17/21 (!) 154/94   02/17/21 143/67   12/04/20 140/86     Wt Readings from Last 3 Encounters:   07/30/21 (!) 152.9 kg (337 lb)    06/17/21 (!) 152.2 kg (335 lb 9.6 oz)   04/20/21 (!) 150.4 kg (331 lb 9.6 oz)       Vitals Signs: ZOX:WRUE mass index is 56.08 kg/m., otherwise as above, reviewed with patient    General: Pleasant, humorous, female appears comfortable and in NAD  HEENT: Normocephalic/atraumatic. Hearing intact to normal conversational tones  Neck:  Supple with full AROM without pain.   Spine: mild tenderness to palpation of thoracic paraspinal muscles. Pain with forward flexion and lateral rotations  Cardiovascular: mildly tachycardic. Regular.   Resp: CTAB. No w/r/r. Normal work of breathing.   Extremity: No edema. Lower extremities appear symmetric.   Skin: No rashes  Neuro: A&O x 3.  moves all 4 extremities equally, strength grossly intact. Sensation to light touch intact in upper and lower extremities bilaterally except reported diminished sensation on R lateral aspect of foot. Decreased sensation to sharp touch on R great toe. Proprioception intact.   Psych: Tearful at times. Somewhat labile affect     Labs and Imaging:   Labs and Imaging results reviewed with Jenna [name] .     Follow-up:     RTC:  1-2 weeks for chronic conditions. Needs close followup    Signed by:   [name], MD  PGY-3 Department of Internal Medicine   07/30/2021 4:37 PM

## 2021-07-31 ENCOUNTER — Encounter (HOSPITAL_COMMUNITY): Payer: Self-pay | Admitting: *Deleted

## 2021-07-31 ENCOUNTER — Emergency Department (HOSPITAL_COMMUNITY)
Admission: EM | Admit: 2021-07-31 | Discharge: 2021-07-31 | Disposition: A | Discharge status: Home / Self Care | Payer: 59 | Attending: Emergency Medicine | Admitting: Emergency Medicine

## 2021-07-31 ENCOUNTER — Other Ambulatory Visit: Payer: Self-pay

## 2021-07-31 ENCOUNTER — Emergency Department (HOSPITAL_COMMUNITY): Payer: 59

## 2021-07-31 DIAGNOSIS — N202 Calculus of kidney with calculus of ureter: Secondary | ICD-10-CM | POA: Diagnosis not present

## 2021-07-31 DIAGNOSIS — N201 Calculus of ureter: Secondary | ICD-10-CM | POA: Diagnosis not present

## 2021-07-31 DIAGNOSIS — R319 Hematuria, unspecified: Secondary | ICD-10-CM | POA: Diagnosis not present

## 2021-07-31 DIAGNOSIS — N200 Calculus of kidney: Secondary | ICD-10-CM | POA: Diagnosis not present

## 2021-07-31 DIAGNOSIS — R109 Unspecified abdominal pain: Secondary | ICD-10-CM | POA: Diagnosis not present

## 2021-07-31 HISTORY — DX: Disorder of kidney and ureter, unspecified: N28.9

## 2021-07-31 LAB — SYPHILIS SCREEN
Syphilis Screen: NEGATIVE
Syphilis Status: NONREACTIVE

## 2021-07-31 LAB — HIV-1/2  ANTIGEN/ANTIBODY SCREEN WITH CONFIRMATION: HIV 1&2 ANTIGEN/ANTIBODY: NONREACTIVE

## 2021-07-31 LAB — VITAMIN B12: Vitamin B12: 552 pg/mL (ref 232–1245)

## 2021-07-31 LAB — HEMOGLOBIN A1C: Hemoglobin A1C: 5.3 %

## 2021-07-31 LAB — PREGNANCY, URINE: Preg Test, Ur: NEGATIVE

## 2021-07-31 LAB — COMPREHENSIVE METABOLIC PANEL
ALT: 28 U/L (ref 0–44)
AST: 26 U/L (ref 15–41)
Albumin: 4.5 g/dL (ref 3.5–5.0)
Alkaline Phosphatase: 62 U/L (ref 38–126)
Anion gap: 8 (ref 5–15)
BUN: 11 mg/dL (ref 6–20)
CO2: 23 mmol/L (ref 22–32)
Calcium: 9.7 mg/dL (ref 8.9–10.3)
Chloride: 107 mmol/L (ref 98–111)
Creatinine, Ser: 0.85 mg/dL (ref 0.44–1.00)
GFR, Estimated: >60 mL/min (ref >60)
Glucose, Bld: 106 mg/dL — ABNORMAL HIGH (ref 70–99)
Potassium: 3.8 mmol/L (ref 3.5–5.1)
Sodium: 138 mmol/L (ref 135–145)
Total Bilirubin: 0.6 mg/dL (ref 0.3–1.2)
Total Protein: 8.1 g/dL (ref 6.5–8.1)

## 2021-07-31 LAB — URINALYSIS, ROUTINE W REFLEX MICROSCOPIC
Bacteria, UA: NONE SEEN
Bilirubin Urine: NEGATIVE
Glucose, UA: NEGATIVE mg/dL
Ketones, ur: NEGATIVE mg/dL
Leukocytes,Ua: NEGATIVE
Nitrite: NEGATIVE
Protein, ur: NEGATIVE mg/dL
Specific Gravity, Urine: 1.001 — ABNORMAL LOW (ref 1.005–1.030)
pH: 6 (ref 5.0–8.0)

## 2021-07-31 LAB — CBC WITH DIFFERENTIAL/PLATELET
Abs Immature Granulocytes: 0.03 10*3/uL (ref 0.00–0.07)
Basophils Absolute: 0 10*3/uL (ref 0.0–0.1)
Basophils Relative: 0 %
Eosinophils Absolute: 0 10*3/uL (ref 0.0–0.5)
Eosinophils Relative: 0 %
HCT: 41.7 % (ref 36.0–46.0)
Hemoglobin: 14.4 g/dL (ref 12.0–15.0)
Immature Granulocytes: 0 %
Lymphocytes Relative: 12 %
Lymphs Abs: 1.1 10*3/uL (ref 0.7–4.0)
MCH: 30.5 pg (ref 26.0–34.0)
MCHC: 34.5 g/dL (ref 30.0–36.0)
MCV: 88.3 fL (ref 80.0–100.0)
Monocytes Absolute: 0.3 10*3/uL (ref 0.1–1.0)
Monocytes Relative: 4 %
Neutro Abs: 7.8 10*3/uL — ABNORMAL HIGH (ref 1.7–7.7)
Neutrophils Relative %: 84 %
Platelets: 365 10*3/uL (ref 150–400)
RBC: 4.72 MIL/uL (ref 3.87–5.11)
RDW: 12.4 % (ref 11.5–15.5)
WBC: 9.3 10*3/uL (ref 4.0–10.5)
nRBC: 0 % (ref 0.0–0.2)

## 2021-07-31 MED ORDER — HYDROCODONE-ACETAMINOPHEN 5-325 MG PO TABS: 1.0000 | ORAL_TABLET | ORAL | 0 refills | Status: DC | PRN

## 2021-07-31 MED ORDER — TAMSULOSIN HCL 0.4 MG PO CAPS: ORAL_CAPSULE | ORAL | 0 refills | Status: DC

## 2021-07-31 NOTE — ED Provider Triage Note (Signed)
Emergency Medicine Provider Triage Evaluation Note  Samantha Nguyen , a 36 y.o. female  was evaluated in triage.  Pt complains of left flank pain and left groin pain. States that she passed a large kidney stone 2 nights ago and has continued to have pain since then. She endorses significant history of kidney stones in the past and states that the last time she had kidney stones she had to be admitted for sepsis. She went to her urologist earlier today who found hematuria on her UA and send her here for further evaluation. Of note, patient is extremely anxious and tearful in triage with concern for developing sepsis. She denies fevers or chills, nausea or vomiting.  Review of Systems  Positive: Left flank and groin pain Negative: Fevers, chills  Physical Exam  Ht 5\' 7"  (1.702 m)   Wt 79.8 kg   BMI 27.57 kg/m  Gen:   Awake, no distress   Resp:  Normal effort  MSK:   Moves extremities without difficulty  Other:  No CVA tenderness  Medical Decision Making  Medically screening exam initiated at 1:23 PM.  Appropriate orders placed.  Samantha Nguyen was informed that the remainder of the evaluation will be completed by another provider, this initial triage assessment does not replace that evaluation, and the importance of remaining in the ED until their evaluation is complete.     Samantha Maxin, PA-C 07/31/21 1328

## 2021-07-31 NOTE — Discharge Instructions (Signed)
You have a kidney stones in both kidneys, and some in your right distal ureter.  These may be causing some of your discomfort.  We are prescribing a pain reliever and Flomax to help pass the stones.  Do not drive or drink alcohol when taking the narcotic pain reliever.  Call the urology office for follow-up appointment to be seen for further care and treatment

## 2021-07-31 NOTE — ED Notes (Addendum)
Patient inquired about drinking alcohol and taking flomax and if she needs to abstain from drinking.  Pharmacy contacted. Pharmacy advised there was no contraindication for flomax and alcohol.

## 2021-07-31 NOTE — ED Provider Notes (Signed)
[facility]-EMERGENCY DEPT Provider Note   CSN: [id number] Arrival date & time: 07/31/21  1227     History {Add pertinent medical, surgical, social history, OB history to HPI:1} Chief Complaint  Patient presents with   Cystitis    Samantha Nguyen is a 36 y.o. female.  HPI She presents for evaluation of left flank pain, right flank pain, and hematuria.  She saw her PCP today who tested her urine and told her she had blood in it and suggested she come to the ED.  Patient Showed a kidney stone, that she passed 2 days ago.  Since then she has not had any more right-sided flank pain.  She denies fever, chills, nausea or vomiting.  She was worried about sepsis so decided to come here to get checked out.  She has not seen her urologist recently.  She has previously had stone extraction.    Home Medications Prior to Admission medications   Medication Sig Start Date End Date Taking? Authorizing Provider  acetaminophen (TYLENOL) 500 MG tablet Take 500 mg by mouth every 6 (six) hours as needed for fever.    [provider]  ALPRAZolam Prudy Feeler) 0.5 MG tablet Take 1 tablet (0.5 mg total) by mouth 3 (three) times daily as needed for anxiety. 05/31/21   Bethann Berkshire, MD  amoxicillin (AMOXIL) 500 MG capsule Take 2 capsules (1,000 mg total) by mouth 2 (two) times daily. 11/08/16   Mardella Layman, MD  Amoxicillin-Pot Clavulanate (AUGMENTIN PO) Take by mouth.    [provider]  cetirizine (ZYRTEC) 10 MG tablet Take 10 mg by mouth daily.    [provider]  Cholecalciferol (VITAMIN D3) 5000 units TABS Take 1 tablet by mouth daily.    [provider]  Cranberry 425 MG CAPS Take 1 capsule by mouth daily.    [provider]  diclofenac (VOLTAREN) 75 MG EC tablet Take 1 tablet (75 mg total) by mouth 2 (two) times daily as needed. Do not take until finished with steroid taper 10/22/20   Cristie Hem, PA-C  fluticasone [facility]) 50 MCG/ACT nasal  spray Place 1 spray into both nostrils daily. 05/08/17   Georgetta Haber, NP  ibuprofen (ADVIL,MOTRIN) 200 MG tablet Take 200 mg by mouth every 6 (six) hours as needed for fever or mild pain.    [provider]  Multiple Vitamins-Minerals (MULTIVITAMIN ADULT) TABS Take 1 tablet by mouth daily.    [provider]  oxyCODONE-acetaminophen (PERCOCET/ROXICET) 5-325 MG tablet Take 1-2 tablets by mouth every 4 (four) hours as needed for moderate pain. 09/09/16   McKenzie, Mardene Celeste, MD  PARoxetine (PAXIL) 30 MG tablet Take 30 mg by mouth daily.    [provider]  predniSONE (STERAPRED UNI-PAK 21 TAB) 10 MG (21) TBPK tablet Take as directed 10/22/20   Cristie Hem, PA-C  tamsulosin (FLOMAX) 0.4 MG CAPS capsule Take 0.4 mg by mouth daily after supper.    [provider]  traMADol (ULTRAM) 50 MG tablet Take 50 mg by mouth every 6 (six) hours as needed for moderate pain.    [provider]      Allergies    Sulfa antibiotics    Review of Systems   Review of Systems  Physical Exam Updated Vital Signs BP (!) 133/93   Pulse 90   Temp 98.9 F (37.2 C) (Oral)   Resp 18   Ht 5\' 7"  (1.702 m)   Wt 79.8 kg   SpO2 100%  BMI 27.57 kg/m  Physical Exam Vitals and nursing note reviewed.  Constitutional:      General: She is not in acute distress.    Appearance: She is well-developed. She is not ill-appearing or toxic-appearing.  HENT:     Head: Normocephalic and atraumatic.     Right Ear: External ear normal.     Left Ear: External ear normal.  Eyes:     Conjunctiva/sclera: Conjunctivae normal.     Pupils: Pupils are equal, round, and reactive to light.  Neck:     Trachea: Phonation normal.  Cardiovascular:     Rate and Rhythm: Normal rate.  Pulmonary:     Effort: Pulmonary effort is normal.  Abdominal:     General: There is no distension.  Musculoskeletal:        General: Normal range of motion.     Cervical back: Normal range of motion and  neck supple.  Skin:    General: Skin is warm and dry.  Neurological:     Mental Status: She is alert and oriented to person, place, and time.     Cranial Nerves: No cranial nerve deficit.     Sensory: No sensory deficit.     Motor: No abnormal muscle tone.     Coordination: Coordination normal.  Psychiatric:        Mood and Affect: Mood normal.        Behavior: Behavior normal.        Thought Content: Thought content normal.        Judgment: Judgment normal.    ED Results / Procedures / Treatments   Labs (all labs ordered are listed, but only abnormal results are displayed) Labs Reviewed  CBC WITH DIFFERENTIAL/PLATELET - Abnormal; Notable for the following components:      Result Value   Neutro Abs 7.8 (*)    All other components within normal limits  COMPREHENSIVE METABOLIC PANEL - Abnormal; Notable for the following components:   Glucose, Bld 106 (*)    All other components within normal limits  URINALYSIS, ROUTINE W REFLEX MICROSCOPIC - Abnormal; Notable for the following components:   Color, Urine COLORLESS (*)    Specific Gravity, Urine 1.001 (*)    Hgb urine dipstick SMALL (*)    All other components within normal limits  URINE CULTURE  PREGNANCY, URINE    EKG None  Radiology CT Renal Stone Study  Result Date: 07/31/2021 CLINICAL DATA:  Flank pain, kidney stone suspected EXAM: CT ABDOMEN AND PELVIS WITHOUT CONTRAST TECHNIQUE: Multidetector CT imaging of the abdomen and pelvis was performed following the standard protocol without IV contrast. RADIATION DOSE REDUCTION: This exam was performed according to the departmental dose-optimization program which includes automated exposure control, adjustment of the mA and/or kV according to patient size and/or use of iterative reconstruction technique. COMPARISON:  CT examination dated August 24, 2016 FINDINGS: Lower chest: No acute abnormality. Hepatobiliary: No focal liver abnormality is seen. No gallstones, gallbladder wall  thickening, or biliary dilatation. Pancreas: Unremarkable. No pancreatic ductal dilatation or surrounding inflammatory changes. Spleen: Normal in size without focal abnormality. Adrenals/Urinary Tract: Adrenal glands are unremarkable. There is again multiple calculi in the distal right ureter/ureterovesicular junction. The right ureter is however normal in caliber. There is a nonobstructing calculus in the interpolar region of the right kidney. There are multiple nonobstructing calculus in the upper pole of the left kidney measuring 3-4 mm. There is also another 3 mm nonobstructing calculus in the interpolar region of the left kidney.  Left ureter is normal in caliber without evidence of hydronephrosis or ureteral calculus. Stomach/Bowel: Stomach is within normal limits. Appendix appears normal. No evidence of bowel wall thickening, distention, or inflammatory changes. Vascular/Lymphatic: No significant vascular findings are present. No enlarged abdominal or pelvic lymph nodes. Reproductive: Uterus and bilateral adnexa are unremarkable. Other: No abdominal wall hernia or abnormality. No abdominopelvic ascites. Musculoskeletal: No acute or significant osseous findings. IMPRESSION: 1.  Nonobstructing calculi in bilateral kidneys. 2. Cluster of calculi at the right ureterovesical junction, similar to prior examination. Right ureter is however normal in caliber without evidence of surrounding inflammatory changes. 3. Bowel loops are normal in caliber. No evidence of colitis or diverticulitis. Normal appendix. Electronically Signed   By: Larose HiresImran  Ahmed D.O.   On: 07/31/2021 16:40    Procedures Procedures  {Document cardiac monitor, telemetry assessment procedure when appropriate:1}  Medications Ordered in ED Medications - No data to display  ED Course/ Medical Decision Making/ A&P                           Medical Decision Making  ***  {Document critical care time when appropriate:1} {Document review of  labs and clinical decision tools ie heart score, Chads2Vasc2 etc:1}  {Document your independent review of radiology images, and any outside records:1} {Document your discussion with family members, caretakers, and with consultants:1} {Document social determinants of health affecting pt's care:1} {Document your decision making why or why not admission, treatments were needed:1} Final Clinical Impression(s) / ED Diagnoses Final diagnoses:  None    Rx / DC Orders ED Discharge Orders     None

## 2021-07-31 NOTE — ED Triage Notes (Signed)
Recently passed a kidney stone, continue to have pain described at meatus/bladder opening. Saw urology for UA today and was told to come to the ED due to blood in urine

## 2021-08-01 LAB — URINE CULTURE: Culture: NO GROWTH

## 2021-08-03 ENCOUNTER — Other Ambulatory Visit: Payer: Self-pay

## 2021-08-03 ENCOUNTER — Other Ambulatory Visit: Payer: Self-pay | Admitting: Student in an Organized Health Care Education/Training Program

## 2021-08-03 DIAGNOSIS — D509 Iron deficiency anemia, unspecified: Secondary | ICD-10-CM

## 2021-08-03 DIAGNOSIS — J4550 Severe persistent asthma, uncomplicated: Secondary | ICD-10-CM

## 2021-08-03 LAB — FERRITIN: Ferritin: 11 ng/mL (ref 10–120)

## 2021-08-03 LAB — TIBC
Iron: 14 ug/dL — ABNORMAL LOW (ref 34–165)
TIBC: 500 ug/dL — ABNORMAL HIGH (ref 250–450)
Transferrin Saturation: 3 % — ABNORMAL LOW (ref 15–50)

## 2021-08-03 LAB — TRANSFERRIN: Transferrin: 370 mg/dL — ABNORMAL HIGH (ref 200–360)

## 2021-08-04 ENCOUNTER — Telehealth: Payer: Self-pay

## 2021-08-04 DIAGNOSIS — N202 Calculus of kidney with calculus of ureter: Secondary | ICD-10-CM | POA: Diagnosis not present

## 2021-08-04 NOTE — Telephone Encounter (Signed)
Call to patient  Relayed message from Dr. Earlean Polka to follow up in clinic for next steps. Appointment made with Dr. Amadeo Garnet for 6/8.    Patient asking if she requires a transfusions. Is concerned because her current menstrual cycle is producing heavy bleeding.    Message sent to Dr. Earlean Polka.      HI Team,     Can you please call Jenna [name] and let her know that her bloodwork shows chronic low blood counts from her anemia but did not show any reason why she was having the numbness/tingling/sensation changes in her hands and feet? Please have her schedule a FUV in the next 1-2 weeks to discuss the next steps for further workup for her symptoms.     Thanks,   Maralyn Sago

## 2021-08-04 NOTE — Telephone Encounter (Signed)
Call to patient. Relayed message from Dr. Earlean Polka that she is not at the point of needing a transfusion.  Instructed patient to make an appointment with hematology and OB GYN. Explained that a messgae had been sent to her [facility] to assist her with making those appointments.  Patient verbalized understanding.

## 2021-08-05 ENCOUNTER — Telehealth: Payer: Self-pay

## 2021-08-05 NOTE — Telephone Encounter (Signed)
Spoke with the patient and reviewed exercise stress echo instructions. Does not need to hold any medications Verified appointment time, date, and location. She states she will have a cab bringing her to the appointment. She had no further questions at this time.

## 2021-08-05 NOTE — Telephone Encounter (Signed)
Unable to reach the patient at this time. She will be available after 4 PM today (5/24). Will try to call patient back at 4 PM to review stress echo instructions, as well as appointment time/date/location.

## 2021-08-06 ENCOUNTER — Telehealth: Payer: Self-pay | Admitting: Hematology & Oncology

## 2021-08-06 ENCOUNTER — Other Ambulatory Visit: Payer: Self-pay

## 2021-08-06 NOTE — Progress Notes (Signed)
Jenna [name] is scheduled for a Primary Care Office appointment on 08/20/2021 at 4:15pm.  At 3:15pm [employer] will pick up Jenna [name] at [address], [place] and take them to [address] [place]  At 5:45pm [employer] will pick up Jenna [name] at [address], [place] and take them to [employer], [place]  Jenna [name] may contact [employer] at [phone] for changes to pick-up and return times prior to this trip.  Inv# [id number]    [facility] Manager  [facility]  O: [phone]  C: [phone]

## 2021-08-07 ENCOUNTER — Other Ambulatory Visit: Payer: Medicaid (Managed Care)

## 2021-08-07 DIAGNOSIS — D500 Iron deficiency anemia secondary to blood loss (chronic): Secondary | ICD-10-CM

## 2021-08-11 ENCOUNTER — Encounter: Payer: Self-pay | Admitting: Gastroenterology

## 2021-08-11 NOTE — Progress Notes (Signed)
CM met with pt at her primary care appt. CM and pt updated care plan, crisis plan, and comp assessment. Case review completed during appt.    Assessment Utilized: Crisis Plan  Names/Roles of participants: Jenna [name] - pt/Jenna [name] - HHCM.  Description of Client participation and understanding: Pt engaged and understands the purpose of the crisis plan.  Status of completion of assessment: Assessment completed during visit.  Summary of Key Crises identified: Pt feeling overwhelmed, unable to complete daily tasks.  Summary of preventative actions CM can support: Pt is familiar with MCU and can call 911 if needed.  Summary of Support resources to contact if client is in crisis: Pt can call primary care office or CM as needed.  Status of provision of copy of the Plan to Client and Care Team: CM will send to pt and care team.    Assessment Utilized: Comprehensive Assessment  Names/Roles of participants: Jenna [name] - pt/Jenna [name] - [facility].  Description of Client participation: Pt engaged and open about recent issues.  Status of completion of assessment: Assessment completed through conversation and chart review.  Summary of findings relevant to Care Management work: Pt still working on housing and moving out of hotel.  Next steps to address findings: CM to assist with apartment listings and applications.    Participants involved in review of Plan: Care Plan Review  Progress towards Goals: Pt still working on goals related to health and social health.  Changes to Plan: CM to assist with housing.  Updates to Clients Strengths/Preferences: None at this time.  Updates to Barriers: None at this time.  Client agreement to changes: Pt agreeable to changes on care plan.  Plan to provide copy to client/providers: CM will send to pt and care team.    [facility] Manager  [facility]  O: [phone]  C: [phone]

## 2021-08-13 ENCOUNTER — Other Ambulatory Visit: Payer: Self-pay

## 2021-08-13 NOTE — Progress Notes (Signed)
Jenna [name] is scheduled for a Mental Health Counseling appointment on 08/13/2021 at 4:30pm.  At 3:30pm [facility] will pick up Jenna [name] at [address] [place] and take them to [address] [place], [place]  At [employer] [facility] [place] will pick up Jenna [name] at [address] [place], [place] and take them to [employer], [place]  [name] may contact [facility] [place] at [phone] for changes to pick-up and return times prior to this trip.    With three additonal riders per pt request.     Date: 08/13/2021 Time: 4:30pm Inv# [id number]

## 2021-08-19 ENCOUNTER — Other Ambulatory Visit: Payer: Self-pay

## 2021-08-20 ENCOUNTER — Ambulatory Visit: Payer: Medicaid (Managed Care) | Admitting: Student in an Organized Health Care Education/Training Program

## 2021-08-20 DIAGNOSIS — F4323 Adjustment disorder with mixed anxiety and depressed mood: Secondary | ICD-10-CM | POA: Diagnosis not present

## 2021-08-20 DIAGNOSIS — R69 Illness, unspecified: Secondary | ICD-10-CM | POA: Diagnosis not present

## 2021-08-21 ENCOUNTER — Encounter: Payer: Self-pay | Admitting: Internal Medicine

## 2021-08-21 NOTE — Progress Notes (Signed)
FYI ONLY    Patient has no showed 4 appointments this year.  If they no show one more appointment they will be dismissed from the practice.

## 2021-08-24 ENCOUNTER — Other Ambulatory Visit: Payer: Self-pay

## 2021-08-24 ENCOUNTER — Telehealth: Payer: Self-pay | Admitting: Hematology & Oncology

## 2021-08-24 NOTE — Telephone Encounter (Signed)
Call made to our office regarding this patient and inability to confirm where she is presently.    Has already missed an appointment this past week and I am  told that no one is able to reach her.    Has Venofer appointment on Wednesday this week.    Forwarding this information on to the team.    Verbalized understanding.

## 2021-08-24 NOTE — Progress Notes (Signed)
Pt called CM; states that she is back at home and wants to know when her appts are. CM reviewed all upcoming appts at length. Pt states that she can attend her infusion appts as long as trips are scheduled; feels that she needs these appts because she does not feel well.    [facility] Manager  [facility]  O: [phone]  C: [phone]

## 2021-08-25 MED FILL — Iron Sucrose Inj 20 MG/ML (Fe Equiv): INTRAVENOUS | Qty: 10 | Status: AC

## 2021-08-26 ENCOUNTER — Ambulatory Visit: Payer: Medicaid (Managed Care) | Attending: Oncology

## 2021-08-26 ENCOUNTER — Other Ambulatory Visit: Payer: Self-pay

## 2021-08-26 VITALS — BP 151/87 | HR 83 | Temp 97.0°F | Resp 22

## 2021-08-26 DIAGNOSIS — D500 Iron deficiency anemia secondary to blood loss (chronic): Secondary | ICD-10-CM | POA: Insufficient documentation

## 2021-08-26 MED ORDER — SODIUM CHLORIDE 0.9 % IV SOLN WRAPPED *I*
200.0000 mg | Freq: Once | INTRAVENOUS | Status: AC
Start: 2021-08-26 — End: 2021-08-26
  Administered 2021-08-26: 200 mg via INTRAVENOUS
  Filled 2021-08-26: qty 200

## 2021-08-26 MED ORDER — SODIUM CHLORIDE 0.9 % IV SOLN WRAPPED *I*
30.0000 mL/h | Status: DC | PRN
Start: 2021-08-26 — End: 2021-08-26
  Administered 2021-08-26: 30 mL/h via INTRAVENOUS

## 2021-08-26 MED ORDER — METHYLPREDNISOLONE SOD SUCC 125 MG IJ SOLR(62.5MG/ML) *WRAPPED*
62.5000 mg | Freq: Once | INTRAMUSCULAR | Status: AC
Start: 2021-08-26 — End: 2021-08-26
  Administered 2021-08-26: 62.5 mg via INTRAVENOUS
  Filled 2021-08-26: qty 1

## 2021-08-26 MED ORDER — FAMOTIDINE (PF) 20 MG/2ML IV SOLN *I*
20.0000 mg | Freq: Once | INTRAVENOUS | Status: AC
Start: 2021-08-26 — End: 2021-08-26
  Administered 2021-08-26: 20 mg via INTRAVENOUS
  Filled 2021-08-26: qty 2

## 2021-08-26 MED ORDER — ACETAMINOPHEN 325 MG PO TABS *I*
650.0000 mg | ORAL_TABLET | Freq: Once | ORAL | Status: AC
Start: 2021-08-26 — End: 2021-08-26
  Administered 2021-08-26: 650 mg via ORAL
  Filled 2021-08-26: qty 2

## 2021-08-26 NOTE — Progress Notes (Signed)
Patient arrived to infusion in stable condition. Chart reviewed; patient within parameters for scheduled treament. Pre-meds given per order  and patient tolerated Venofer  via PIV well without complaint. Positive blood return from PIV pre and post infusion. VSS post infusion and pt without event for 30 minute observation period. Patient denied questions prior to discharge and was encouraged to reach out to clinic team with future questions or concerns. Patient verbalized understanding and was discharged in stable condition.  Patient complied with masking policy throughout duration of appointment. Writer maintained use of mask and face shield/eyewear throughout duration of appointment. Writer was within 5 feet of patient for > 15 minutes due to the nature of the visit.

## 2021-08-27 ENCOUNTER — Other Ambulatory Visit: Payer: Self-pay

## 2021-08-27 DIAGNOSIS — Z5941 Food insecurity: Secondary | ICD-10-CM

## 2021-08-27 NOTE — Progress Notes (Signed)
Pt requesting food assistance; CM to help pt with picking up food donation this morning.    [facility] Manager  [facility]  O: [phone]  C: [phone]

## 2021-09-01 NOTE — Progress Notes (Signed)
CM called pt to check in; pt's SO answered and stated that pt is gone and has called both the police and CPS. SO abruptly hung up phone.    [facility] Manager  [facility]  O: [phone]  C: [phone]

## 2021-09-02 ENCOUNTER — Ambulatory Visit: Payer: Medicaid (Managed Care) | Admitting: Sleep Medicine

## 2021-09-03 ENCOUNTER — Other Ambulatory Visit: Payer: Self-pay

## 2021-09-03 ENCOUNTER — Ambulatory Visit: Payer: Medicaid (Managed Care)

## 2021-09-03 ENCOUNTER — Encounter: Payer: Self-pay | Admitting: Gastroenterology

## 2021-09-03 MED FILL — Iron Sucrose Inj 20 MG/ML (Fe Equiv): INTRAVENOUS | Qty: 10 | Status: AC

## 2021-09-07 ENCOUNTER — Encounter: Payer: Self-pay | Admitting: Gastroenterology

## 2021-09-07 NOTE — Progress Notes (Signed)
CM picked up pt at her hotel; CM transported to [employer] and [facility] for food and clothing donations. CM delivered paperwork to DSS dropbox on behalf of pt. Pt thankful for the assistance.    [facility] Manager  [facility]  O: [phone]  C: [phone]

## 2021-09-07 NOTE — Telephone Encounter (Signed)
Not picked up/delivered

## 2021-09-10 ENCOUNTER — Other Ambulatory Visit: Payer: Self-pay

## 2021-09-10 ENCOUNTER — Ambulatory Visit: Payer: Medicaid (Managed Care)

## 2021-09-10 MED FILL — Iron Sucrose Inj 20 MG/ML (Fe Equiv): INTRAVENOUS | Qty: 10 | Status: AC

## 2021-09-10 NOTE — Progress Notes (Signed)
Jenna [name] is scheduled for a Hematology/Blood appointment on 09/17/2021 at 11:30am.  At 10:30am [facility] will pick up Jenna [name] at [address] [place] and take them to [address], [place]  At 1:30pm [facility] [place] will pick up Jenna [name] at [employer], [place] and take them to [employer], [place]  Jenna [name] may contact [facility] [place] at [phone] for changes to pick-up and return times prior to this trip.  Inv# [id number]    Jenna [name] is scheduled for a Cardiology/Heart appointment on 09/22/2021 at 1:15pm.  At 12:15pm [facility] will pick up Jenna [name] at [address] [place] and take them to [address], [place]  At 2:45pm [facility] [place] will pick up Jenna [name] at [employer], [place] and take them to [employer], [place]  Jenna [name] may contact [facility] [place] at [phone] for changes to pick-up and return times prior to this trip.  Inv# [phone]    Jenna [name] is scheduled for a Hematology/Blood appointment on 09/24/2021 at 1:00pm.  At 12:00pm [facility] will pick up Jenna [name] at [address], [place] and take them to [address], [place]  At 3:00pm [facility] [place] will pick up Jenna [name] at [employer], [place] and take them to [employer], [place]  Jenna [name] may contact [facility] [place] at [phone] for changes to pick-up and return times prior to this trip.  Inv# [id number]    Jenna [name] is scheduled for a Hematology/Blood appointment on 10/01/2021 at 1:00pm.  At 12:00pm [name] will pick up Jenna [name] at [address], [place] and take them to [address], [place]  At 3:00pm [facility] [place] will pick up Jenna [name] at [employer], [place] and take them to [employer], [place]  Jenna [name] may contact [facility] [place] at [phone] for changes to pick-up and  return times prior to this trip.  Inv# [id number]    [facility] Manager  [facility]  O: [phone]  C: [phone]

## 2021-09-11 DIAGNOSIS — R69 Illness, unspecified: Secondary | ICD-10-CM | POA: Diagnosis not present

## 2021-09-11 DIAGNOSIS — F4323 Adjustment disorder with mixed anxiety and depressed mood: Secondary | ICD-10-CM | POA: Diagnosis not present

## 2021-09-16 MED FILL — Iron Sucrose Inj 20 MG/ML (Fe Equiv): INTRAVENOUS | Qty: 10 | Status: AC

## 2021-09-17 ENCOUNTER — Ambulatory Visit: Payer: Medicaid (Managed Care) | Attending: Oncology

## 2021-09-17 ENCOUNTER — Other Ambulatory Visit: Payer: Self-pay

## 2021-09-17 VITALS — BP 166/78 | HR 100 | Temp 97.0°F

## 2021-09-17 DIAGNOSIS — D500 Iron deficiency anemia secondary to blood loss (chronic): Secondary | ICD-10-CM | POA: Insufficient documentation

## 2021-09-17 MED ORDER — FAMOTIDINE (PF) 20 MG/2ML IV SOLN *I*
20.0000 mg | Freq: Once | INTRAVENOUS | Status: AC
Start: 2021-09-17 — End: 2021-09-17
  Administered 2021-09-17: 20 mg via INTRAVENOUS
  Filled 2021-09-17 (×3): qty 2

## 2021-09-17 MED ORDER — SODIUM CHLORIDE 0.9 % IV SOLN WRAPPED *I*
30.0000 mL/h | Status: DC | PRN
Start: 2021-09-17 — End: 2021-09-17
  Administered 2021-09-17: 30 mL/h via INTRAVENOUS

## 2021-09-17 MED ORDER — SODIUM CHLORIDE 0.9 % IV SOLN WRAPPED *I*
200.0000 mg | Freq: Once | INTRAVENOUS | Status: AC
Start: 2021-09-17 — End: 2021-09-17
  Administered 2021-09-17: 200 mg via INTRAVENOUS
  Filled 2021-09-17: qty 10
  Filled 2021-09-17: qty 200
  Filled 2021-09-17: qty 10

## 2021-09-17 MED ORDER — METHYLPREDNISOLONE SOD SUCC 125 MG IJ SOLR(62.5MG/ML) *WRAPPED*
62.5000 mg | Freq: Once | INTRAMUSCULAR | Status: AC
Start: 2021-09-17 — End: 2021-09-17
  Administered 2021-09-17: 62.5 mg via INTRAVENOUS
  Filled 2021-09-17 (×3): qty 1

## 2021-09-17 NOTE — Progress Notes (Signed)
Patient arrived to infusion in stable condition. Chart reviewed; patient initially here with blood pressures too high for treatment resolved with subsequent readings. Patient reports crawling feeling along arms and legs and like "my teeth are falling out" after Venofer infusions but also at other times.   Provider Abby, PA gave approval for today's Venofer.  Pre-meds given per order  and patient tolerated  via PIV well without complaint. Positive blood return from PIV pre and post infusion. VSS post infusion and pt without event for 30 minute observation period.  Discussed importance of taking her blood pressure medications regularly as she reporetd not taking them; patient verbalized understanding. Patient denied questions prior to discharge and was encouraged to reach out to clinic team with future questions or concerns. Patient verbalized understanding and was discharged in stable condition. Writer maintained use of mask and face shield/eyewear throughout duration of appointment. Writer was within 5 feet of patient for > 15 minutes due to the nature of the visit.

## 2021-09-18 ENCOUNTER — Other Ambulatory Visit: Payer: Self-pay

## 2021-09-22 ENCOUNTER — Other Ambulatory Visit: Payer: Medicaid (Managed Care)

## 2021-09-23 ENCOUNTER — Ambulatory Visit
Admission: RE | Admit: 2021-09-23 | Discharge: 2021-09-23 | Disposition: A | Discharge status: Home / Self Care | Payer: Medicaid (Managed Care) | Source: Ambulatory Visit | Attending: Cardiology | Admitting: Cardiology

## 2021-09-23 ENCOUNTER — Other Ambulatory Visit: Payer: Self-pay

## 2021-09-23 DIAGNOSIS — R5383 Other fatigue: Secondary | ICD-10-CM | POA: Insufficient documentation

## 2021-09-23 DIAGNOSIS — R0600 Dyspnea, unspecified: Secondary | ICD-10-CM | POA: Insufficient documentation

## 2021-09-23 DIAGNOSIS — R079 Chest pain, unspecified: Secondary | ICD-10-CM | POA: Insufficient documentation

## 2021-09-23 LAB — EXERCISE STRESS ECHO COMPLETE
Aortic Diameter (mid tubular): 2.9 cm
BMI: 56.2 kg/m2
BP Diastolic: 69 mmHg
BP Systolic: 155 mmHg
BSA: 2.65 m2
Deceleration Time - MV: 204 ms
E/A ratio: 1.4
EF: 70 %
Estimated workload: 2 METS
Heart Rate: 89 {beats}/min
Height: 65 in
LA Systolic Vol BSA Index: 22.6 mL/m2
LA Systolic Vol Height Index: 36.3 mL/m
LA Systolic Volume: 60 mL
LV ASE Mass BSA Index: 185 gm/m2
LV ASE Mass Height 2.7 Index: 126.6 gm/m2.7
LV ASE Mass Height Index: 297 gm/m
LV ASE Mass: 490.4 gm
LV Diastolic Volume Index: 38.2 mL/m2
LV Posterior Wall Thickness: 1.6 cm
LV Septal Thickness: 1.82 cm
LV wall/cavity ratio: 0.59
LVED Diameter BSA Index: 2.19 cm/m2
LVED Diameter Height Index: 3.51 cm/m
LVED Diameter: 5.8 cm
LVED Volume BSA Index: 38 ml/m2
LVED Volume BSA Index: 38.2 mL/m2
LVED Volume Height Index: 61.3 mL/m
LVED Volume: 101.2 mL
LVOT Area (calculated): 4.26 cm2
LVOT Cardiac Index: 3.14 L/min/m2
LVOT Cardiac Output: 8.31 L/min
LVOT Diameter: 2.33 cm
LVOT PWD VTI: 21.91 cm
LVOT PWD Velocity (mean): 99.3 cm/s
LVOT PWD Velocity (peak): 141.4 cm/s
LVOT SV BSA Index: 35.24 mL/m2
LVOT SV Height Index: 56.6 mL/m
LVOT Stroke Rate (mean): 423.2 mL/s
LVOT Stroke Rate (peak): 602.6 mL/s
LVOT Stroke Volume: 93.37 cc
MPHR: 185 {beats}/min
MV Peak A Velocity: 65 cm/s
MV Peak E Velocity: 90.9 cm/s
Mitral Annular E/Ea Vel Ratio: 13.92
Mitral Annular Ea Velocity: 6.53 cm/s
Peak DBP: 83 mmHg
Peak HR: 132 {beats}/min
Peak SBP: 174 mmHg
Percent MPHR: 71.4 %
RPP: 22968 BPM x mmHG
RR Interval: 674.16 ms
SEM (LVOT Mean) mN-s: 97.41 mN-s
SEM (LVOT peak) mN-s: 138.71 mN-s
Stress Peak Stage: 0.27
Stress duration (min): 0 min
Stress duration (sec): 49 s
Weight (lbs): 337 [lb_av]
Weight: 5392 oz

## 2021-09-23 MED ORDER — AGITATED SODIUM CHLORIDE 0.9 % INJ (FOR ECHO) *I*
15.0000 mL | INTRAMUSCULAR | Status: AC | PRN
Start: 2021-09-23 — End: 2021-09-23

## 2021-09-23 MED ORDER — PERFLUTREN PROTEIN A MICROSPH (OPTISON) IV SUSP *I*
1.5000 mL | INTRAVENOUS | Status: DC | PRN
Start: 2021-09-23 — End: 2021-09-24

## 2021-09-23 NOTE — Progress Notes (Signed)
Pt requested help with apartment listings. CM collected contact information as well as monthly costs and sent to pt via email.    [facility] Manager  [facility]  O: [phone]  C: [phone]

## 2021-09-23 NOTE — Discharge Instructions (Signed)
Pt is here for an exercise stress echocardiogram. Instructed to follow up with ordering provider. Pt verbalized understanding.

## 2021-09-24 ENCOUNTER — Other Ambulatory Visit: Payer: Self-pay

## 2021-09-24 ENCOUNTER — Ambulatory Visit: Payer: Medicaid (Managed Care)

## 2021-09-24 MED FILL — Iron Sucrose Inj 20 MG/ML (Fe Equiv): INTRAVENOUS | Qty: 10 | Status: AC

## 2021-09-28 ENCOUNTER — Other Ambulatory Visit: Payer: Self-pay

## 2021-09-28 NOTE — Progress Notes (Signed)
Spoke with patient.     Patient report/history: Abdominal pain/near umbilicus, spinal pain. Pain near c-section. Reports "hurtful achy" pain.   Now has  bad headaches.     Onset: Began pain started a long time ago.   Taking Tylenol. Not much help. Doesn't want to continue to take Tylenol to treat, wants to have an assessment.    Recommended coming in the office for evaluation and patient agrees.    Patient scheduled on 10/02/21 at 1050am.    Patient verbalized understanding.

## 2021-09-28 NOTE — Progress Notes (Signed)
Pt is requesting help to schedule an appt; pt states that she is not feeling well overall. Is experiencing pain in her stomach especially under her navel. Pt also experiencing pain in her spine. Would like message relayed to her primary care office.    [facility] Manager  [facility]  O: [phone]  C: [phone]

## 2021-10-01 ENCOUNTER — Ambulatory Visit: Payer: Medicaid (Managed Care)

## 2021-10-01 MED FILL — Iron Sucrose Inj 20 MG/ML (Fe Equiv): INTRAVENOUS | Qty: 10 | Status: AC

## 2021-10-02 ENCOUNTER — Ambulatory Visit: Payer: Medicaid (Managed Care)
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
## 2021-10-05 ENCOUNTER — Encounter: Payer: Self-pay | Admitting: Internal Medicine

## 2021-10-07 ENCOUNTER — Encounter: Payer: Self-pay | Admitting: Gastroenterology

## 2021-10-07 NOTE — Progress Notes (Signed)
CM picked up pt from her temporary home and brought pt to [facility] for clothing donation. Pt then brought to [facility] office for public market tickets. Pt thankful for the assistance.    [facility] Manager  [facility]  O: [phone]  C: [phone]

## 2021-10-08 ENCOUNTER — Other Ambulatory Visit: Payer: Self-pay

## 2021-10-08 NOTE — Progress Notes (Signed)
[facility] HH Set up.

## 2021-10-09 ENCOUNTER — Telehealth: Payer: Self-pay | Admitting: Internal Medicine

## 2021-10-09 ENCOUNTER — Other Ambulatory Visit: Payer: Self-pay
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
## 2021-10-09 NOTE — Progress Notes (Signed)
Jenna [name] is scheduled for a Other Specialist appointment on 10/15/2021 at 8:15am.  At 7:15am [facility] will pick up Jenna [name] at [address] [place] and take them to [address], [place]  At 9:45am [facility] [place] will pick up Jenna [name] at [address] [facility] [place], [place] and take them to [address] [place]  Reachel Guthridge may contact [facility] [place] at [phone] for changes to pick-up and return times prior to this trip.  Inv# [id number]    [facility] Manager  [facility]  O: [phone]  C: [phone]
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
## 2021-10-15 ENCOUNTER — Ambulatory Visit: Payer: Medicaid (Managed Care) | Admitting: Sleep Medicine

## 2021-10-22 ENCOUNTER — Other Ambulatory Visit: Payer: Self-pay

## 2021-10-27 ENCOUNTER — Encounter: Payer: Self-pay | Admitting: Gastroenterology

## 2021-10-27 NOTE — Progress Notes (Signed)
CM brought pt to public market for shopping. CM assisted pt in picking out healthy fruits and vegetables and counseled on healthy recipes using vegetables, such as eggplant parmesan and fruit salad. Pt thanked CM for the assistance.    [facility] Manager  [facility]  O: [phone]  C: [phone]

## 2021-11-09 ENCOUNTER — Other Ambulatory Visit: Payer: Self-pay

## 2021-11-10 ENCOUNTER — Other Ambulatory Visit: Payer: Self-pay

## 2021-11-11 NOTE — Progress Notes (Signed)
CM called pt to discuss planned visit for that day to pick up food from pantries. Pt not feeling well and will call CM back to reschedule when able.    [facility] Manager  [facility]  O: [phone]  C: [phone]

## 2021-11-11 NOTE — Progress Notes (Signed)
CM returned call to pt; call went to VM. CM left a brief message requesting a call back.    Adriano Bischof  Health Home Care Manager  [facility]  O: [phone]  C: [phone]

## 2021-11-18 ENCOUNTER — Telehealth: Payer: Self-pay | Admitting: Internal Medicine

## 2021-11-18 NOTE — Telephone Encounter (Signed)
Spoke with patient.       Patient reports:  Sx onset:  unclear  Shortness of breath past few days- , severe pain right side under shoulder blades.   -constantly needing to urinate  -dry mouth - cotton swab  -patient   -Patient thought she was going to have a stroke then started describing swollen glands - describing her tonsils?     -Patient was very tearful on the phone  -Writer asked is the patient is overwhelmed with what's going on or if there is anything specific going on - patient states she is very stressed out  -Patient kept stopping the phone call with writer and would yell at multiple people in the home, patient seemed very overwhelmed. Writer tried clarify if she was safe and patient said yes and then continued to try to describe her symptoms    -Patient was tangential in providing a history of symptoms  -Patient is declining the ED    Patient denies:   -palpitations  -fever  -no injury    Advised patient to be evaluated in the ED; patient declined this. Patient agreed to an appt in office on 9/7 with Chief's - but agreed to go to the ED if symptoms worsen and patient agrees with plan.       Advised pt to call in the meantime with questions or concerns, or to seek care with worsening symptoms. Patient verbalized understanding.

## 2021-11-18 NOTE — Telephone Encounter (Addendum)
Copied from CRM [id number]. Topic: Urgent/Emergent - Protocol-Specific Urgent  >> Nov 18, 2021 12:00 PM Sue Lush wrote:  Symptoms presented: Shortness of breath past few days, severe pain right side under shoulder blades.  No know injury Requesting to speak with nursing   Action taken: Ember can be reached [phone] cellular    Tearful on in coming call thought nursing was picking up directly    Declined call for EMS Services     After 4 minute hold in nursing queue [place] states can take return call    Incoming phone number different than noted in request [phone])

## 2021-11-19 ENCOUNTER — Ambulatory Visit: Payer: Medicaid (Managed Care)

## 2021-11-20 ENCOUNTER — Other Ambulatory Visit: Payer: Self-pay

## 2021-11-20 ENCOUNTER — Encounter: Payer: Self-pay | Admitting: Internal Medicine

## 2021-11-20 ENCOUNTER — Telehealth: Payer: Self-pay

## 2021-11-20 NOTE — Telephone Encounter (Signed)
Spoke with Jenna [name].     She has a frequent non-productive cough.    No fever.    Wheezing.    Inhalers do not seem to be helping.    Asked if she shouldn't be seen by someone or to go to urgent care.    Stated that they could evaluate her.    Asked for her to get her labs drawn so that we can look at her blood counts.  Also stated that we have Venofer ordered for Monday.    Stated that she would go for labs today    Verbalized understanding.

## 2021-11-20 NOTE — Telephone Encounter (Signed)
Patient states she missed the appointment yesterday. States she does not need an appointment and ended call.     Verlon Au, RN

## 2021-11-23 ENCOUNTER — Telehealth: Payer: Self-pay

## 2021-11-23 ENCOUNTER — Ambulatory Visit: Payer: Medicaid (Managed Care)

## 2021-11-23 ENCOUNTER — Encounter: Payer: Self-pay | Admitting: Gastroenterology

## 2021-11-23 MED FILL — Iron Sucrose Inj 20 MG/ML (Fe Equiv): INTRAVENOUS | Qty: 10 | Status: AC

## 2021-11-23 NOTE — Telephone Encounter (Signed)
LM stating that her appointment is cancelled today since labs were not drawn.

## 2021-11-23 NOTE — Progress Notes (Signed)
Pt called CM to request help finding apartment listings. CM provided pt an email with apartment listings based on her budget. Pt thanked CM for the assistance.    [facility] Manager  [facility]  O: [phone]  C: [phone]

## 2021-11-24 ENCOUNTER — Other Ambulatory Visit: Payer: Self-pay

## 2021-11-25 ENCOUNTER — Other Ambulatory Visit
Admission: RE | Admit: 2021-11-25 | Discharge: 2021-11-25 | Disposition: A | Discharge status: Home / Self Care | Payer: Medicaid (Managed Care) | Source: Ambulatory Visit | Attending: Hematology & Oncology | Admitting: Hematology & Oncology

## 2021-11-25 ENCOUNTER — Telehealth: Payer: Self-pay | Admitting: Internal Medicine

## 2021-11-25 ENCOUNTER — Other Ambulatory Visit: Payer: Self-pay

## 2021-11-25 ENCOUNTER — Other Ambulatory Visit: Payer: Self-pay | Admitting: Student in an Organized Health Care Education/Training Program

## 2021-11-25 DIAGNOSIS — I10 Essential (primary) hypertension: Secondary | ICD-10-CM | POA: Insufficient documentation

## 2021-11-25 DIAGNOSIS — D500 Iron deficiency anemia secondary to blood loss (chronic): Secondary | ICD-10-CM | POA: Insufficient documentation

## 2021-11-25 DIAGNOSIS — R5383 Other fatigue: Secondary | ICD-10-CM | POA: Insufficient documentation

## 2021-11-25 DIAGNOSIS — R079 Chest pain, unspecified: Secondary | ICD-10-CM | POA: Insufficient documentation

## 2021-11-25 LAB — CBC AND DIFFERENTIAL
Baso # K/uL: 0.1 10*3/uL (ref 0.0–0.2)
Basophil %: 0.4 %
Eos # K/uL: 0.1 10*3/uL (ref 0.0–0.5)
Eosinophil %: 0.9 %
Hematocrit: 28 % — ABNORMAL LOW (ref 34–49)
Hemoglobin: 7.1 g/dL — ABNORMAL LOW (ref 11.2–16.0)
IMM Granulocytes #: 0.1 10*3/uL — ABNORMAL HIGH (ref 0.0–0.0)
IMM Granulocytes: 0.7 %
Lymph # K/uL: 2.8 10*3/uL (ref 1.0–5.0)
Lymphocyte %: 20.2 %
MCH: 20 pg — ABNORMAL LOW (ref 26–32)
MCHC: 26 g/dL — ABNORMAL LOW (ref 32–36)
MCV: 78 fL (ref 75–100)
Mono # K/uL: 1 10*3/uL (ref 0.1–1.0)
Monocyte %: 7.3 %
Neut # K/uL: 9.6 10*3/uL — ABNORMAL HIGH (ref 1.5–6.5)
Nucl RBC # K/uL: 0.1 10*3/uL — ABNORMAL HIGH (ref 0.0–0.0)
Nucl RBC %: 0.5 /100 WBC — ABNORMAL HIGH (ref 0.0–0.2)
Platelets: 482 10*3/uL — ABNORMAL HIGH (ref 150–450)
RBC: 3.6 MIL/uL — ABNORMAL LOW (ref 4.0–5.5)
RDW: 18.6 % — ABNORMAL HIGH (ref 0.0–15.0)
Seg Neut %: 70.5 %
WBC: 13.6 10*3/uL — ABNORMAL HIGH (ref 3.5–11.0)

## 2021-11-25 LAB — TIBC
Iron: 17 ug/dL — ABNORMAL LOW (ref 34–165)
TIBC: 527 ug/dL — ABNORMAL HIGH (ref 250–450)
Transferrin Saturation: 3 % — ABNORMAL LOW (ref 15–50)

## 2021-11-25 LAB — RBC MORPHOLOGY

## 2021-11-25 LAB — TRANSFERRIN: Transferrin: 390 mg/dL — ABNORMAL HIGH (ref 200–360)

## 2021-11-25 LAB — RETICULOCYTES
Retic %: 3.1 % — ABNORMAL HIGH (ref 0.7–2.1)
Retic Abs: 110 10*3/uL — ABNORMAL HIGH (ref 29.9–90.9)

## 2021-11-25 LAB — MULTIPLE ORDERING DOCS

## 2021-11-25 LAB — FERRITIN: Ferritin: 6 ng/mL — ABNORMAL LOW (ref 10–120)

## 2021-11-25 MED ORDER — ACETAMINOPHEN 500 MG PO TABS *I*
1000.0000 mg | ORAL_TABLET | Freq: Three times a day (TID) | ORAL | 0 refills | Status: DC | PRN
Start: 2021-11-25 — End: 2023-06-30
  Filled 2021-11-25: qty 100, 16d supply, fill #0
  Filled 2022-09-04: qty 100, 17d supply, fill #0

## 2021-11-25 NOTE — Telephone Encounter (Signed)
Copied from CRM [id number]. Topic: Appointments - Schedule Appointment  >> Nov 25, 2021  1:06 PM Sue Lush wrote:  Jenna [name] , Patient, calling to report that the patient is experiencing left sided pain and back pain with nausea one week    Pain rated 8 1/2 out of 10    No recent injuries     Patient requesting same day appointment? yes  Patient requesting call back? yes    Phone number confirmed at [phone]

## 2021-11-25 NOTE — Telephone Encounter (Signed)
Called patients phone. When waiting to speak to patient, was disconnected. Will try to call back again.

## 2021-11-25 NOTE — Telephone Encounter (Signed)
Refill request routed to Dr. Reita May 11/25/2021 3:12 PM.

## 2021-11-25 NOTE — Telephone Encounter (Signed)
Left patient a voicemail message. Requested patient call office back. Office number provided on VM message.

## 2021-11-26 ENCOUNTER — Telehealth: Payer: Self-pay

## 2021-11-26 ENCOUNTER — Other Ambulatory Visit: Payer: Self-pay

## 2021-11-26 NOTE — Telephone Encounter (Signed)
Called patient to see how she is currently feeling. Patient reports "spine pain, congestion, mucous in chest, and wheezing". Patient states she has been experiencing severe fatigue, reports difficulty staying awake. States she experiences heavy menses for about 7 days which requires her to wear a depends. Reports soaking through pads to quickly. Patient states she also has intermittent bleeding. Informed patient that we have her scheduled to receive IV iron on Monday, 9/18 at 1:00 pm. Let her know she is also scheduled for another iron infusion on Monday, 9/25 at 1:00 pm. Patient requesting for social work to contact her. Let patient know writer would pass this on. Patient agreeable to plan, verbalized understanding.

## 2021-11-26 NOTE — Progress Notes (Signed)
Jenna [name] is scheduled for a Hematology/Blood appointment on 11/30/2021 at 1:00pm.  At 12:00pm [facility] will pick up Jenna [name] at [address], [place] and take them to [address], [place]  At 3:00pm [facility] [place] will pick up [name] at [employer], [place] and take them to [employer], [place]  Anirah Hutmacher may contact [facility] [place] at [phone] for changes to pick-up and return times prior to this trip.  Inv# [id number]    [name] is scheduled for a Hematology/Blood appointment on 12/07/2021 at 1:00pm.  At 12:00pm [facility] will pick up Jenna [name] at [address] [place] and take them to [address],   At 3:00pm [facility] [place] will pick up [name] at [employer], [place] and take them to [employer], [place]  Glenola Dice may contact [facility] [place] at [phone] for changes to pick-up and return times prior to this trip.  Inv# [id number]    [facility] Manager  [facility]  O: [phone]  C: [phone]

## 2021-11-27 ENCOUNTER — Encounter: Payer: Self-pay | Admitting: Gastroenterology

## 2021-11-27 ENCOUNTER — Encounter: Payer: Self-pay | Admitting: Internal Medicine

## 2021-11-27 NOTE — Progress Notes (Signed)
Noted 11/27/2021.

## 2021-11-27 NOTE — Progress Notes (Signed)
Patient has missed 5 appointments this year and has been dismissed from [facility].  A certified letter has been sent to the patient.  We can see the patient for 30 day or until 12/20/2021.
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
## 2021-11-27 NOTE — Progress Notes (Signed)
SOCIAL WORK PROGRESS     Received message that patient needs assistance with transportation. Called patient to confirm. Patient reports that she has a case manager who helps assist her with transportation. Patient denied any need for social work at this time. Patient inquired information on her blood work. Informed patient that I would have the team reach out to her. Patient appreciative. No other questions or concerns at this time. Writer reached out to medical team to talk to patient.     Marcille Blanco, LMSW  [facility] Social Worker  Office Phone: [phone]    [facility] SOCIAL WORK:   [facility] Social Work Smart Tool:     Patient referred based on distress screen?: No      Social Work contact made?: Yes      Method of contact:  Telephone    Social Work Assessment:  Barriers to Care    Barriers to care:  Transportation  Referral:     Time spent on this encounter (in minutes):  10

## 2021-11-30 ENCOUNTER — Ambulatory Visit: Payer: Medicaid (Managed Care)

## 2021-11-30 MED FILL — Iron Sucrose Inj 20 MG/ML (Fe Equiv): INTRAVENOUS | Qty: 10 | Status: AC

## 2021-11-30 NOTE — Telephone Encounter (Addendum)
Called and spoke with patient about labs. She asked how her labs were looking and I let her know that her hemoglobin, hematocrit, and iron studies were all very low and I advised that she should come to the IV iron infusions. Her periods are still very heavy. She missed an appointment today and has one scheduled for 12/07/21. I attempted to ask about symptoms (chest pain or shortness of breath) but she was not answering my question and she asked what time the cab would come pick her up. I said I would reach out to social work about setting up transportation for her next infusion appointment and to call us back with any questions or concerns.

## 2021-11-30 NOTE — Progress Notes (Addendum)
SOCIAL WORK PROGRESS     Received message from team that patient needs assistance with transportation. Writer looked on MAS and confirmed that a trip has been put in. Called transportation company, [facility] [place] [phone]) and was unable to leave a message due to mailbox not being set up. Attempted to call patient's health home care manager, Rushie Goltz [phone] and left a voicemail. Clinical research associate changed transportation provider to [employer]. Will continue to follow.     Contacts:  [employer]: [phone]  [facility] Transportation: [phone]    Jenna [name] is scheduled for a Hematology/Blood appointment on 12/07/2021 at 1:00pm.  At 12:00pm [employer] will pick up Jenna [name] at [employer], [place] and take them to [address], [place]  At [employer] will pick up Jenna [name] at [employer], [place] and take them to [employer], [place]    Jenna [name] is scheduled for a Hematology/Blood appointment on 12/09/2021 at 1:30pm.  At 12:30pm [employer] will pick up Jenna [name] at [employer], [place] and take them to [address], [place]  At [employer] will pick up Jenna [name] at [employer], [place] and take them to [employer],     Jenna [name] [place], [place]  [facility] Social Worker  Office Phone: [phone]    [facility] SOCIAL WORK:   [facility] Social Work Chief Executive Officer:     Patient referred based on distress screen?: No      Social Work contact made?: Yes      Method of contact:  Left message    Social Work Assessment:  Barriers to Care    Barriers to care:  Transportation    Social Work post assessment plan:  Referral  Referral:     Transportation:  [facility]    Time spent on this encounter (in minutes):  20

## 2021-12-07 ENCOUNTER — Ambulatory Visit: Payer: Medicaid (Managed Care)

## 2021-12-07 ENCOUNTER — Other Ambulatory Visit: Payer: Self-pay

## 2021-12-07 ENCOUNTER — Telehealth: Payer: Self-pay

## 2021-12-07 DIAGNOSIS — Z6827 Body mass index (BMI) 27.0-27.9, adult: Secondary | ICD-10-CM | POA: Diagnosis not present

## 2021-12-07 DIAGNOSIS — R051 Acute cough: Secondary | ICD-10-CM | POA: Diagnosis not present

## 2021-12-07 DIAGNOSIS — J309 Allergic rhinitis, unspecified: Secondary | ICD-10-CM | POA: Diagnosis not present

## 2021-12-07 MED FILL — Iron Sucrose Inj 20 MG/ML (Fe Equiv): INTRAVENOUS | Qty: 10 | Status: AC

## 2021-12-07 NOTE — Telephone Encounter (Signed)
Left a message with significant other to call and reschedule infusion

## 2021-12-07 NOTE — Progress Notes (Signed)
Pt called regarding her infusion appt today; transportation did not show and she would like to reschedule when possible.    [facility] Manager  [facility]  O: [phone]  C: [phone]
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
## 2021-12-09 ENCOUNTER — Ambulatory Visit: Payer: Medicaid (Managed Care)

## 2021-12-21 ENCOUNTER — Telehealth: Payer: Self-pay | Admitting: Hematology & Oncology

## 2021-12-22 ENCOUNTER — Other Ambulatory Visit: Payer: Self-pay

## 2021-12-22 NOTE — Progress Notes (Signed)
SOCIAL WORK PROGRESS     Received message from team reporting that they would like more information about patient's missed appointments. Called Lucynda and left a voicemail with contact information. Will attempt to call again at a later time.     Marcille Blanco, LMSW  [facility] Social Worker  Office Phone: [phone]    [facility] SOCIAL WORK:   [place] Social Work Chief Executive Officer:     Patient referred based on distress screen?: No      Social Work contact made?: Yes      Method of contact:  Left message    Social Work Assessment:  Barriers to Care    Social Work post assessment plan:  Referral  Referral:     Transportation:  [facility]    Time spent on this encounter (in minutes):  10

## 2021-12-23 ENCOUNTER — Other Ambulatory Visit: Payer: Self-pay

## 2021-12-23 DIAGNOSIS — Z5941 Food insecurity: Secondary | ICD-10-CM

## 2021-12-23 NOTE — Progress Notes (Signed)
Food donation requested.

## 2021-12-23 NOTE — Progress Notes (Signed)
SOCIAL WORK PROGRESS     Called Jenna [name] to discuss transportation. Asked if she was not able to make her recent appointments due to transportation. She reports transportation is not an issue. Confirmed her current address. Informed her that I spoke with [employer] and they reported that she did not answer her phone or come downstairs to be picked up. Jenna [name] reports that her family members sometimes mess with her phone. Inquired if she would be able to wait downstairs at the time the cab is supposed to arrive. Jenna [name] agreed and voiced frustrations over phone call. She inquired more information about her health. Informed her that I cannot discuss but I would have someone from the team reach out to her to answer her questions.     Messaged team information and requested they reach out to Jenna [name]. Will continue to follow.     Jenna [name], LMSW  [facility] Social Worker  Office Phone: [phone]    [facility] SOCIAL WORK:   [facility] Social Work Chief Executive Officer:     Patient referred based on distress screen?: No      Social Work contact made?: Yes      Method of contact:  Telephone    Social Work Assessment:  Barriers to Care    Barriers to care:  Transportation    Social Work post assessment plan:  As needed follow-up  Referral:     Time spent on this encounter (in minutes):  10

## 2021-12-23 NOTE — Telephone Encounter (Signed)
Picked up/delivered

## 2021-12-24 NOTE — Telephone Encounter (Signed)
Left message for patient to call us on significant other's phone  (emergency contact) to see what questions patient has re: her iron infusions, labs, missed appts. Writer left our call back number of [phone] to discuss

## 2021-12-28 ENCOUNTER — Telehealth: Payer: Self-pay | Admitting: Internal Medicine

## 2021-12-28 NOTE — Telephone Encounter (Signed)
Trip Confirmation  Jenna [name] is scheduled for a Primary Care Office appointment on 12/29/2021 at 4:00pm.  At 3:00pm [employer] will pick up Jenna [name] at [address] [place] and take them to [address], [place]  At Jenna [name] will pick up Jenna [name] at [address] [place] and take them to [employer], [place]  Jenna [name] may contact [employer] at [phone] for changes to pick-up and return times prior to this trip.    To ensure seamless access to your healthcare:  If the assigned Transportation Provider cannot accommodate your trip, MAS will reassign it to the next available provider  On the day of the trip, you may confirm your provider - call the automated MAS System & select option 'Confirm My Trip'      Jenna [name] Trip  Date: 12/29/2021 Time: 4:00pm Inv# [phone]

## 2021-12-28 NOTE — Telephone Encounter (Signed)
Sometimes when sleeping, cannot catch breath, stop breathing and then wakes up, begins to breath again.  Sometimes finds foam at mouth?  Reports has breathing problems in general  Believes she may need a blood transfusion.  Doctors took bloodwork and came back "very low." (Bloodwork noted on 11/25/21-writer unable to find providers intervention/assessment )    Symptoms: heart palpitations, short of breath overnight  Denies: chest pain    Patient missed sleep study appointment. Possible sleep apnea.  5/23 last OV at [facility]    Recommended come in office for evalution and development of a treatment plan. Patient agrees.    Pt scheduled on 12/19/21 at 4pm at [facility] with NP.

## 2021-12-28 NOTE — Telephone Encounter (Signed)
Copied from CRM [id number]. Topic: Access to Care - Speak to Provider/Office Staff  >> Dec 28, 2021  8:04 AM Uvaldo Bristle wrote:  Patient is calling to talk with Social Worker Corrin. Patient stated she would like a call back to the Extended Stay in room 120.    Extended Stay Room 120    Extended Stay Phone Number: [phone]

## 2021-12-28 NOTE — Telephone Encounter (Signed)
Copied from CRM [id number]. Topic: Access to Care - Speak to Provider/Office Staff  >> Dec 28, 2021  8:01 AM Uvaldo Bristle wrote:  Patient called and reported the following symptoms breathing problem and foaming at the mouth, hear palpitations for 2 weeks.     Patient's phone number is [phone]    Writer called SIM and [place] informed Clinical research associate a nurse will call the patient back. Writer relayed the information. Patient is asking for a call back at the  Extended Stay room 120.    Extended Stay Phone Number [phone]

## 2021-12-29 ENCOUNTER — Ambulatory Visit: Payer: Medicaid (Managed Care) | Admitting: Adult Health

## 2021-12-31 ENCOUNTER — Encounter: Payer: Self-pay | Admitting: Gastroenterology

## 2021-12-31 NOTE — Progress Notes (Signed)
Pt contacted CM regarding trip to a food pantry on 10/12. CM able to bring pt that afternoon. Pt unsure of how to initiate an appeal for her primary care office; CM offered phone number and advised pt to request an appeal. Pt thanked CM for the assistance.    [facility] Manager  [facility]  O: [phone]  C: [phone]

## 2022-01-01 ENCOUNTER — Encounter: Payer: Self-pay | Admitting: Gastroenterology

## 2022-01-04 ENCOUNTER — Telehealth: Payer: Self-pay | Admitting: Internal Medicine

## 2022-01-04 ENCOUNTER — Emergency Department
Admission: EM | Admit: 2022-01-04 | Discharge: 2022-01-04 | Discharge status: Against Medical Advice | Payer: Medicaid (Managed Care) | Source: Ambulatory Visit

## 2022-01-04 MED ORDER — ONDANSETRON HCL 2 MG/ML IV SOLN *I*
4.0000 mg | Freq: Once | INTRAMUSCULAR | Status: DC
Start: 2022-01-04 — End: 2022-01-05

## 2022-01-04 NOTE — ED Triage Notes (Signed)
Lower abd pain & vaginal bleeding x1 week. Reports different than normal menstrual cycle. Denies any chance of pregnancy.      Prehospital medications given: No

## 2022-01-04 NOTE — Telephone Encounter (Signed)
Copied from CRM [id number]. Topic: Access to Care - Speak to Provider/Office Staff  >> Jan 04, 2022  4:19 PM Uvaldo Bristle wrote:  Patient called and stated the nurse told her they would call her an ambulance today 10.23.2023 at 4:30. Patient is calling to ask the office if they can call the ambulance for her at 5:00 as her husband isn't home yet.     Writer called SIM and Molly Maduro informed the Clinical research associate they will call the ambulance at the requested time.     Call back [phone]

## 2022-01-04 NOTE — Telephone Encounter (Signed)
Copied from CRM [id number]. Topic: Access to Care - Speak to Provider/Office Staff  >> Jan 04, 2022  3:03 PM Vania Rea wrote:  Jenna [name], Patient, calling to report that the patient is experiencing heavy vaginal bleeding with clots, patient states it is so heavy that she is using T-shirts and towels.  Patient states she is bleeding right through pads immediately.      Patient states she is not due for her period.  Patient states she has anemia.    These symptoms have been going on five days, Patients states it is was lighter for the first four days.    Writer unable to reach the back line.    Patient requesting same day appointment? yes  Patient requesting call back? yes    Phone number confirmed at [phone]

## 2022-01-04 NOTE — Telephone Encounter (Signed)
EMS called for patient at this time and currently en route to her at address described.

## 2022-01-04 NOTE — Telephone Encounter (Signed)
Spoke with patient.     Patient reports:   -on 10/18 had pink discharge with a few small blood clots with voiding  -intermittent vaginal bleeding from 10/18-10/20   -on 10/20, had a "big gush" of vaginal bleeding; since then, vaginal bleeding has been persistent   -went through several packages of peri-pads, 3x pairs of pants, and 3x towels yesterday (10/23)  -saturated 3x XXL T-shirts and towels with blood today (10/23)   -she feels increasingly fatigued at this time   -denies feeling faint, SOB, or having falls recently     Advised patient to be seen at the ED as soon as possible. Explained risk for blood-loss anemia and internal hemorrhaging; that she needs a HLOC for testing and IV treatment; possibly a blood transfusion. Pt verbalized agreement with plan; stated she needs to wait until her SO returns from the store to care for her children, and requested that writer call EMS at 4:30 p.m. for her to be transported to the ED. Confirmed current location of patient:     Extended Stay [facility]), Room 120  [address]   [place] [address]

## 2022-01-06 ENCOUNTER — Other Ambulatory Visit: Payer: Self-pay | Admitting: Gastroenterology

## 2022-01-06 DIAGNOSIS — D649 Anemia, unspecified: Secondary | ICD-10-CM | POA: Insufficient documentation

## 2022-01-06 DIAGNOSIS — N939 Abnormal uterine and vaginal bleeding, unspecified: Secondary | ICD-10-CM | POA: Insufficient documentation

## 2022-01-06 LAB — UNMAPPED LAB RESULTS
ABO RH Blood Type (HT): A POS
Antibody Screen (HT): NEGATIVE
Basophil # (HT): 0.1 10 3/uL (ref 0.0–0.2)
Basophil % (HT): 1 % (ref 0–3)
Eosinophil # (HT): 0.2 10 3/uL (ref 0.0–0.6)
Eosinophil % (HT): 1 % (ref 0–5)
Hematocrit (HT): 25 % — ABNORMAL LOW (ref 35–47)
Hemoglobin (HGB) (HT): 6.3 g/dL — ABNORMAL LOW (ref 12.0–16.0)
Lymphocyte # (HT): 2.1 10 3/uL (ref 1.0–4.8)
Lymphocyte % (HT): 18 % (ref 15–45)
MCHC (HT): 25.5 g/dL — ABNORMAL LOW (ref 31.0–37.5)
MCV (HT): 75 fL — ABNORMAL LOW (ref 80–100)
Mean Corpuscular Hemoglobin (MCH) (HT): 19.1 pg — ABNORMAL LOW (ref 26.0–34.0)
Monocyte # (HT): 1 10 3/uL (ref 0.1–1.0)
Monocyte % (HT): 9 % (ref 0–15)
Neutrophil # (HT): 8.2 10 3/uL — ABNORMAL HIGH (ref 1.8–8.0)
Platelets (HT): 387 10 3/uL (ref 150–450)
RBC (HT): 3.29 10 6/uL — ABNORMAL LOW (ref 3.80–5.20)
RDW (HT): 19.4 % — ABNORMAL HIGH (ref 0.0–15.2)
Seg Neut % (HT): 70 % (ref 45–75)
WBC (HT): 11.6 10 3/uL — ABNORMAL HIGH (ref 4.0–11.0)

## 2022-01-06 NOTE — ED Notes (Signed)
Paged obgyn covering 2x

## 2022-01-06 NOTE — ED Notes (Addendum)
Upon calling over heard for patient to return back to treatment area patient returned with OBGYN

## 2022-01-06 NOTE — Progress Notes (Signed)
-------------------------------------------------------------------------------    Attestation signed by Lennox Laity, DO at 01/08/2022  9:12 AM  I have discussed and reviewed the notes, and assessments performed below, I concur with the documentation. As H&H stable and bleeding controlled, okay for outpatient management and close follow up in the clinic.     Mariann Barter, DO    -------------------------------------------------------------------------------    Gyn Progress Note    Admit date: 01/06/22    Subjective:    She reports feeling well. Her bleeding has stopped since being in the ED. She received 2U PRBC and denies any concerns.      She denies any headache, nausea, vomiting, blurry vision, epigastric pain, chest pain, difficulty breathing, SOB, leg pain or swelling.     ROS as above    Problem List:   Patient Active Problem List   Diagnosis   . Bacterial vaginosis   . Candidiasis   . STD (female)   . Essential hypertension   . Hydradenitis   . Iron deficiency anemia   . H/O menorrhagia   . Biliary colic        Allergies: Patient has no known allergies.       Vital Signs:   Temp:  [36.3 C (97.3 F)-36.9 C (98.4 F)] 36.9 C (98.4 F)  Pulse:  [78-86] 84  Resp:  [18-23] 23  BP: (128-134)/(65-96) 131/70    I&O for last 24 hours:     Intake/Output Summary (Last 24 hours) at 01/06/2022 1913  Last data filed at 01/06/2022 1506  Gross per 24 hour   Intake 1000 ml   Output --   Net 1000 ml       Physical Exam   Constitutional: She is oriented to person, place, and time. She appears well-developed and well-nourished.   Cardiovascular: Normal rate.    Pulmonary/Chest: Effort normal.   Abdominal: Soft. She exhibits no distension. There is no abdominal tenderness.   Genitourinary:    Vulva and vagina normal.      Genitourinary Comments: Cervix is small, anterior, and very high      Neurological: She is alert and oriented to person, place, and time.   Skin: Skin is warm and dry.   Psychiatric: She  has a normal mood and affect. Her behavior is normal.           ASSESSMENT / PLAN  36 y.o.  F  [id number] with a PMHX of HTN, tobacco use, obesity, Menorrhagia requiring transfusion, BTL, CS x 6, Cocaine use, IDA. She presented to the ED with menorrhagia, and AUB x 8 days requiring 2U PRBC.     AUB  - s/p 2U PRBC  - transvaginal US without concerning findings  - vaginosis/GC/chlamydia/pap with cotesting collected, of note cervical specimen collection was very challenging due to location and orientation of cervix, despite attempts by different providers and mcroberts positioning, pt aware this may need to be recollected  - started provera 10 mg bid   - strongly recommended admission for monitoring and repeat H+H, pt feels unable to do so due to the need to care for family, given overall stability and resolution of vaginal bleeding on exam agreeable to discharge with very close follow-up, alarm signs and symptoms and return precautions reviewed, patient states she will follow-up in clinic in 1 week     Discussed with Dr. Dorothy Spark, MD  01/06/2022   7:13 PM

## 2022-01-06 NOTE — ED Notes (Signed)
Unaware if patient left with IV still in place. Charge nurse made aware.

## 2022-01-06 NOTE — ED Provider Notes (Signed)
[facility] ADULT ED  History   36 year old female G8 P7-0-1-7 with past medical history of hypertension, iron deficiency anemia, menorrhagia, asthma, cocaine abuse presents to the ED today for vaginal bleeding.  The patient reports that she has a history of heavy menses, but this month she has been bleeding for 8 days since October 16th and this is early for her menses.  She reports fatigue, lower abdominal pain and nausea/vomiting.  She denies fever/chills, diarrhea, dysuria.  She reports a history of requiring multiple blood transfusions for anemia.      History provided by:  Patient      Patient Active Problem List   Diagnosis   . Bacterial vaginosis   . Candidiasis   . STD (female)   . Essential hypertension   . Hydradenitis   . Iron deficiency anemia   . H/O menorrhagia   . Biliary colic       Past Medical History:   Diagnosis Date   . Anemia    . Anxiety    . Asthma    . Cocaine abuse (CMS [place] Code)    . Depression    . Hydradenitis    . Substance induced mood disorder        Past Surgical History:   Procedure Laterality Date   . CESAREAN SECTION     . CONVERTED FROM CERNER PROCEDURE HISTORY      H/O: C-section   . CONVERTED FROM CERNER PROCEDURE HISTORY      H/O: C-section   . CONVERTED FROM CERNER PROCEDURE HISTORY      H/O: C-section   . FRACTURE SURGERY         No family history on file.    Social History     Tobacco Use   . Smoking status: Every Day     Packs/day: .5     Types: Cigarettes   . Smokeless tobacco: Never   Substance Use Topics   . Alcohol use: No     Comment: Pt is a recovering addict   . Drug use: Not Currently     Types: "Crack" cocaine, Cocaine, Marijuana     Comment: marijuana ~ 1x/month      Sexual Activity     Substance and Sexual Activity   Sexual Activity Yes   . Birth control/protection: None       Review of Systems   Constitutional: Positive for fatigue. Negative for chills and fever.   Respiratory: Positive for shortness of breath (DOE).    Cardiovascular: Negative for chest pain,  palpitations and leg swelling.   Gastrointestinal: Positive for abdominal pain, nausea and vomiting. Negative for diarrhea.   Genitourinary: Positive for pelvic pain and vaginal bleeding.   Musculoskeletal: Negative.    Skin: Negative.    Neurological: Negative for dizziness, syncope, light-headedness and headaches.       Physical Exam     Vitals:    01/06/22 1242   BP: (!) 131/96   Pulse: 83   Resp: 18   Temp: 36.3 C (97.3 F)   SpO2: 99%   Weight: (!) 158.8 kg (350 lb)   Height: 1.651 m ('5\' 5"'$ )       Physical Exam  Vitals and nursing note reviewed.   Constitutional:       General: She is awake.      Appearance: She is obese. She is not ill-appearing, toxic-appearing or diaphoretic.   HENT:      Head: Atraumatic.      Right Ear:  External ear normal.      Left Ear: External ear normal.      Mouth/Throat:      Mouth: Mucous membranes are moist.   Eyes:      Extraocular Movements: Extraocular movements intact.      Conjunctiva/sclera: Conjunctivae normal.      Pupils: Pupils are equal, round, and reactive to light.   Cardiovascular:      Rate and Rhythm: Normal rate and regular rhythm.      Pulses:           Radial pulses are 2+ on the right side and 2+ on the left side.      Heart sounds: Normal heart sounds. No murmur heard.  Pulmonary:      Effort: Pulmonary effort is normal.      Breath sounds: Normal breath sounds.   Chest:      Chest wall: No tenderness.   Abdominal:      General: Bowel sounds are normal. There is no distension.      Palpations: Abdomen is soft. There is no mass.      Tenderness: There is abdominal tenderness in the suprapubic area. There is no right CVA tenderness, left CVA tenderness, guarding or rebound.   Genitourinary:     General: Normal vulva.      Vagina: Bleeding present.      Cervix: Cervical bleeding present. No cervical motion tenderness.      Uterus: Tender.       Adnexa:         Right: No mass or tenderness.          Left: No mass or tenderness.        Comments: Moderate amount of  bleeding in the vaginal vault  Musculoskeletal:         General: No tenderness. Normal range of motion.      Cervical back: Normal range of motion. No spinous process tenderness or muscular tenderness.      Right lower leg: No edema.      Left lower leg: No edema.   Skin:     General: Skin is warm and dry.      Findings: No rash.   Neurological:      Mental Status: She is alert and oriented to person, place, and time.      Cranial Nerves: No cranial nerve deficit.      Sensory: Sensation is intact. No sensory deficit.      Motor: No weakness.      Gait: Gait normal.   Psychiatric:         Mood and Affect: Mood normal.         Speech: Speech normal.         Behavior: Behavior normal.         ED Procedures   Procedures    ED Course   ED MDM:      Differential Diagnosis:     Differential diagnosis includes:  Ectopic pregnancy, uterine fibroids, menometrorrhagia, miscarriage, DUB, chronic anemia, anemia, requiring transfusion, chronic blood loss anemia        ED Course:     ED course details:  1330  Plan: IV fluids, check blood work, pelvic ultrasound, GYN consult    [id number] Case discussed with GYN PA Anda Kraft for consult regarding the vaginal bleeding.    1500 hemoglobin 6.3, hematocrit 25.  The patient will be transfused 2 units PRBCs for the severe, symptomatic anemia.  Written consent for blood  transfusion was obtained.    Discussion with independent historian(s):     Independent historian(s) utilized:  Patient    Sign-out:     Patient signed out to:  Dr. Serena Colonel    Pending studies/Disposition plan:  [id number] Pending PRBC transfusion and GYN consult.  The patient will likely be discharged home after completion of the blood transfusion.                             ED [facility], Danton Sewer, MD  01/06/22 [phone]

## 2022-01-06 NOTE — ED Provider Notes (Signed)
[facility] ADULT ED  History   HPI    Patient Active Problem List   Diagnosis   . Bacterial vaginosis   . Candidiasis   . STD (female)   . Essential hypertension   . Hydradenitis   . Iron deficiency anemia   . H/O menorrhagia   . Biliary colic       Past Medical History:   Diagnosis Date   . Anemia    . Anxiety    . Asthma    . Cocaine abuse (CMS [place] Code)    . Depression    . Hydradenitis    . Substance induced mood disorder        Past Surgical History:   Procedure Laterality Date   . CESAREAN SECTION     . CONVERTED FROM CERNER PROCEDURE HISTORY      H/O: C-section   . CONVERTED FROM CERNER PROCEDURE HISTORY      H/O: C-section   . CONVERTED FROM CERNER PROCEDURE HISTORY      H/O: C-section   . FRACTURE SURGERY         No family history on file.    Social History     Tobacco Use   . Smoking status: Every Day     Packs/day: .5     Types: Cigarettes   . Smokeless tobacco: Never   Substance Use Topics   . Alcohol use: No     Comment: Pt is a recovering addict   . Drug use: Not Currently     Types: "Crack" cocaine, Cocaine, Marijuana     Comment: marijuana ~ 1x/month      Sexual Activity     Substance and Sexual Activity   Sexual Activity Yes   . Birth control/protection: None       Review of Systems    Physical Exam     Vitals:    01/06/22 1242 01/06/22 1608 01/06/22 1623   BP: (!) 131/96 134/65 128/72   Pulse: 83 86 78   Resp: '18 18 18   '$ Temp: 36.3 C (97.3 F) 36.6 C (97.9 F) 36.6 C (97.8 F)   TempSrc:  Oral Oral   SpO2: 99% 100% 100%   Weight: (!) 158.8 kg (350 lb)     Height: 1.651 m ('5\' 5"'$ )         Physical Exam    ED Procedures   Procedures    ED Course   ED MDM:     ED Course:     ED course details:  Received patient in signout from previous physician.  Please see prior notes for detailed history and physical exam.  Briefly this is a 36 year old female with heavy vaginal bleeding resulting in anemia.  She is consented and received blood transfusion in the ED.  She was signed out pending transfusion and OB/GYN  consultation.  She is been seen and evaluated by OB/GYN.  They have ordered additional ultrasound and will hospitalize the patient under their service.  Patient is otherwise well-appearing without any other complaints.    Discussion with independent historian(s):     Independent historian(s) utilized:  Patient  Discussion with other healthcare provider(s):     Patient management discussed with:  Consultant    Disposition:     Admission: The admitting physician was notified, case discussed, and further patient care was handed off.  I have notified the patient and/or guardian regarding admission.  ED Attestation   Attestation           Cherly Anderson, MD  01/06/22 [phone]

## 2022-01-06 NOTE — ED Notes (Signed)
Patient stating she wants to leave, provider notified

## 2022-01-07 ENCOUNTER — Telehealth: Payer: Self-pay | Admitting: Internal Medicine

## 2022-01-07 NOTE — Telephone Encounter (Signed)
Please review outside labs from [facility] collected on 10/25.

## 2022-01-07 NOTE — ED Notes (Signed)
Iv removed, pt safe for dischare

## 2022-01-08 ENCOUNTER — Telehealth: Payer: Self-pay | Admitting: Internal Medicine

## 2022-01-08 NOTE — Telephone Encounter (Signed)
Please review outside labs from [facility] collected on 10/25.

## 2022-01-11 ENCOUNTER — Telehealth: Payer: Self-pay | Admitting: Internal Medicine

## 2022-01-11 NOTE — Telephone Encounter (Signed)
Incoming transfer from triage.    Patient states she was given medicine in ED to stop bleeding after discharge, but she has no way to pick it up to stop bleeding. Has not started medication.    Bleeding excessively right now. Has not stopped since ED visit. Heavier bleeding currently.  Stomach hurts   Big pain in belly.  Stomach is killing her.    Nobody is telling her what is bleeding?    Advised patient to return to ED immediately. Patient agrees with plan and will return to ED after she gets ready.    Advised pt to call in the meantime with questions or concerns, or to seek care with worsening symptoms. Patient verbalized understanding.

## 2022-01-11 NOTE — Telephone Encounter (Signed)
Please review outside labs from [facility] collected on 10/25.

## 2022-01-11 NOTE — Telephone Encounter (Signed)
Copied from CRM #[id number]. Topic: Access to Care - Speak to Provider/Office Staff  >> Jan 11, 2022  9:39 AM Uvaldo Bristle wrote:  Patient called and reported the following symptoms Vaginal bleeding    Patient's phone number is [phone]    Urgent call was transferred to [name].     Patient stated she was seen in [facility] 10.25.2023 for hemoraging and recieved a blood transfustion and was given medication and wasn't able to get the medicaiton. The patient stated she is now bleeding really badly again.

## 2022-01-13 ENCOUNTER — Other Ambulatory Visit: Payer: Self-pay

## 2022-01-13 NOTE — Progress Notes (Signed)
Clinical Care Management: Message noted and no further Clinical Care Manager follow-up necessary at this time. Appreciate Health Home Care Manager follow-up with patient.

## 2022-01-13 NOTE — Progress Notes (Signed)
External discharge from [facility] on 01/07/22

## 2022-01-13 NOTE — Progress Notes (Signed)
Called patient as per SW  message:    Rushie Goltz, MSW  Sim Mahler Pss; Sim Nurse Mahler36 minutes ago (11:43 AM)  Pt asking for a follow up appt tomorrow if available. I'm unsure of what her follow up is supposed to be but she doesn't sound well over the phone.  ______________  Patient reports bad cough -mucous is black, pain in abdomen, and loose stools, cough, nasal congestion, lot of wheezing , some short of breath.  There is excess background noise, banging in background, loud TV, patient is speaking with son while speaking with Clinical research associate, etc. Difficult to hear/understand Clinical research associate often during call.    Treatment: vicks,ibuprofen,   Denies: fever,     Onset: since 01/06/22-since d/c from hosptal.    Asked patient why didn't call earlier-writer unclear.  Recommended come in office today.  Offered patient appt today at 4pm. Patient states she has children to pick up from school and needs later appointments. Reviewed no appointment past 4pm for this week and shared last appt is typically no later than 420pm and no eve appt availability.    Offered AM appointments. Patient states can come in Friday 01/15/22 at 830am.    Reviewed if symptoms worsen to return to ED.    Patient verbalized understanding and agrees.

## 2022-01-13 NOTE — Progress Notes (Signed)
Care Management Transition Care Assessment    Date of Discharge Call:  01/13/2022                       Patient Assessment:    Do you have any health concerns since your discharge? :  Yes, pt sounds lethargic over the phone and unable to hold a conversation. Family is present.    Do you have all of your medications?  Unsure, unable to answer.    Do you have any problems taking your medications?  No.        Are you on any anticoagulation medication?  No.    Are you having any problems getting around your house ? Do you need to use an assistive device ?  Seems to be having trouble holding conversation. Unsure of current abilities, but mobile at baseline.    Do you have any issues that cause you stress ?   Yes, pt currently in hotel with her family.      Who are the most important people in your life that provide you support?  Partner, family around [place], [name].    Transportation plan:  None at this time.    Upcoming appointments:     None at this time, requesting an appt tomorrow.    [facility] Manager  [facility]  O: [phone]  C: [phone]

## 2022-01-15 ENCOUNTER — Ambulatory Visit: Payer: Medicaid (Managed Care)

## 2022-01-28 ENCOUNTER — Telehealth: Payer: Self-pay | Admitting: Internal Medicine

## 2022-01-28 NOTE — Telephone Encounter (Signed)
Copied from CRM #[id number]. Topic: Access to Care - Speak to Provider/Office Staff  >> Jan 28, 2022  1:45 PM Chesney Klimaszewski B wrote:  The patient, Jenna [name] is requesting to speak with  Rushie Goltz her Home health care manager and didn't share details of what the call was in regards to but stated Lourdes Sledge is aware she's at the hotel.    Caller stated the room number but call disconnected before writer could capture detail.  Not sure if the patient stated she was in room 120 still.    Able to leave a message on voicemail: yes.      Please call the patient back at the hotel number to discuss.

## 2022-01-29 ENCOUNTER — Other Ambulatory Visit: Payer: Self-pay

## 2022-02-01 NOTE — Telephone Encounter (Unsigned)
Copied from CRM [id number]. Topic: Access to Care - Speak to Provider/Office Staff  >> Feb 01, 2022 10:23 AM Uvaldo Bristle wrote:  Patient is calling to have her Health Home Care manager call her back. Patient is asking for a call back as soon as possible.     Hotel Phone Number  Room 120    Call back [phone]    Writer asked for the phone number and the patient stated Jill Side will know it.

## 2022-02-07 ENCOUNTER — Encounter: Payer: Self-pay | Admitting: Gastroenterology

## 2022-02-07 NOTE — Progress Notes (Signed)
CM picked up Thanksgiving Donation box from [employer] and delivered to pt's home. Pt thanked CM for the assistance.    [facility] Manager  [facility]  O: [phone]  C: [phone]

## 2022-02-15 ENCOUNTER — Other Ambulatory Visit: Payer: Self-pay

## 2022-02-15 ENCOUNTER — Telehealth: Payer: Self-pay | Admitting: Internal Medicine

## 2022-02-15 NOTE — Telephone Encounter (Signed)
Copied from CRM [id number]. Topic: Access to Care - Speak to Provider/Office Staff  >> Feb 15, 2022  2:38 PM Uvaldo Bristle wrote:  [name], Patient, calling to report that the patient is experiencing back pain, side pain. These symptoms have been going on for 1 day(s)    Patient requesting same day appointment? yes  Patient requesting call back? yes    Phone number confirmed at [phone]    The patient stated she thinks she has a bladder infection.     The patient is asking for a call to the Extended Stay Room 120.
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
## 2022-02-16 ENCOUNTER — Ambulatory Visit: Payer: Medicaid (Managed Care)

## 2022-02-16 NOTE — Telephone Encounter (Signed)
Did not show to appointment today. Attempted to call and check in given reported shortness of breath. No answer.    Minerva Areola, MD

## 2022-02-19 ENCOUNTER — Encounter: Payer: Self-pay | Admitting: Gastroenterology

## 2022-02-19 NOTE — Progress Notes (Signed)
CM contacted [employer] to sign up pt for Christmas basket donation. A follow up email was sent with pt demographics.    Gaila Engebretsen  Health Home Care Manager  [facility]  O: [phone]  C: [phone]

## 2022-03-03 ENCOUNTER — Other Ambulatory Visit: Payer: Self-pay

## 2022-03-09 NOTE — Progress Notes (Signed)
CM picked up Christmas basket from [employer] and delivered to pt's home. Pt is housed in a new apartment at [place] with her children and partner.    [facility] Manager  [facility]  O: [phone]  C: [phone]

## 2022-03-10 ENCOUNTER — Ambulatory Visit
Admission: EM | Admit: 2022-03-10 | Discharge: 2022-03-10 | Disposition: A | Discharge status: Home / Self Care | Payer: Medicaid Other

## 2022-03-10 DIAGNOSIS — R6889 Other general symptoms and signs: Secondary | ICD-10-CM

## 2022-03-10 NOTE — Discharge Instructions (Signed)
You have been diagnosed with a viral upper respiratory infection based on your symptoms and exam. Viral illnesses cannot be treated with antibiotics - they are self limiting - and you should find your symptoms resolving within a few days. Get plenty of rest and non-caffeinated fluids. Watch for signs of dehydration including reduced urine output and dark colored urine.  We recommend you use over-the-counter medications for symptom control including acetaminophen (Tylenol), ibuprofen (Advil/Motrin) or naproxen (Aleve) for fever, chills or body aches. You may combine use of acetaminophen and ibuprofen/naproxen if needed. Also recommend cold/cough medication.  Saline mist spray is helpful for removing excess mucus from your nose.  Room humidifiers are helpful to ease breathing at night. I recommend guaifenesin (Mucinex) to help thin and loosen mucus secretions in your respiratory passages.   If appropriate based upon your other medical problems, you might also find relief of nasal/sinus congestion symptoms by using a nasal decongestant such as Flonase (fluticasone) or Sudafed sinus (pseudoephedrine).  You will need to obtain Sudafed from behind the pharmacist counter.  Speak to the pharmacist to verify that you are not duplicating medications with other over-the-counter formulations that you may be using.   Follow up here or with your primary care provider if your symptoms are worsening or not improving.    

## 2022-03-10 NOTE — ED Triage Notes (Signed)
Pt. Presents to UC w/ c/o a cough, fever, sore throat sneezing and increased sweating that started 4 days ago.

## 2022-03-10 NOTE — ED Provider Notes (Signed)
[name]    CSN: [id number] Arrival date & time: 03/10/22  1233      History   Chief Complaint Chief Complaint  Patient presents with   Sore Throat   sneezing    Fever    HPI Samantha Nguyen is a 36 y.o. female.    Sore Throat  Fever   Patient presents to urgent care with complaint of cough, fever, sore throat, sneezing, diaphoresis starting 4 days ago.  She states the sweating started last night and awoke with no fever (temp of 97.x).  Says she is concerned about possible sepsis because she googled her symptoms, especially the lowered body temperature mixed with sweating and was told she might have possibly sepsis.  Past Medical History:  Diagnosis Date   Anxiety    Depressed    History of kidney stones    Migraine    Renal disorder     Patient Active Problem List   Diagnosis Date Noted   Sepsis due to urinary tract infection (HCC) 08/24/2016    Past Surgical History:  Procedure Laterality Date   CESAREAN SECTION     CYSTOSCOPY W/ URETERAL STENT PLACEMENT Right 08/24/2016   Procedure: CYSTOSCOPY WITH RETROGRADE PYELOGRAM/URETERAL STENT PLACEMENT;  Surgeon: Malen Gauze, MD;  Location: [facility];  Service: Urology;  Laterality: Right;   CYSTOSCOPY WITH RETROGRADE PYELOGRAM, URETEROSCOPY AND STENT PLACEMENT Right 09/09/2016   Procedure: CYSTOSCOPY WITH RETROGRADE PYELOGRAM, URETEROSCOPY AND STENT REPLACEMENT;  Surgeon: Malen Gauze, MD;  Location: [facility];  Service: Urology;  Laterality: Right;   MANDIBLE SURGERY      OB History   No obstetric history on file.      Home Medications    Prior to Admission medications   Medication Sig Start Date End Date Taking? Authorizing Provider  fexofenadine (ALLEGRA) 180 MG tablet Take by mouth. 12/07/21  Yes [provider]  acetaminophen (TYLENOL) 500 MG tablet Take 500 mg by mouth every 6 (six) hours as needed for fever.    [provider]  ALPRAZolam  Prudy Feeler) 0.5 MG tablet Take 1 tablet (0.5 mg total) by mouth 3 (three) times daily as needed for anxiety. 05/31/21   Bethann Berkshire, MD  amoxicillin (AMOXIL) 500 MG capsule Take 2 capsules (1,000 mg total) by mouth 2 (two) times daily. 11/08/16   Mardella Layman, MD  Amoxicillin-Pot Clavulanate (AUGMENTIN PO) Take by mouth.    [provider]  cetirizine (ZYRTEC) 10 MG tablet Take 10 mg by mouth daily.    [provider]  Cholecalciferol (VITAMIN D3) 5000 units TABS Take 1 tablet by mouth daily.    [provider]  Cranberry 425 MG CAPS Take 1 capsule by mouth daily.    [provider]  diclofenac (VOLTAREN) 75 MG EC tablet Take 1 tablet (75 mg total) by mouth 2 (two) times daily as needed. Do not take until finished with steroid taper 10/22/20   Cristie Hem, PA-C  fluticasone [facility]) 50 MCG/ACT nasal spray Place 1 spray into both nostrils daily. 05/08/17   Georgetta Haber, NP  HYDROcodone-acetaminophen (NORCO) 5-325 MG tablet Take 1 tablet by mouth every 4 (four) hours as needed for moderate pain or severe pain. 07/31/21   Mancel Bale, MD  ibuprofen (ADVIL,MOTRIN) 200 MG tablet Take 200 mg by mouth every 6 (six) hours as needed for fever or mild pain.    [provider]  Multiple Vitamins-Minerals (MULTIVITAMIN ADULT) TABS Take 1 tablet by mouth daily.  [provider]  oxyCODONE-acetaminophen (PERCOCET/ROXICET) 5-325 MG tablet Take 1-2 tablets by mouth every 4 (four) hours as needed for moderate pain. 09/09/16   McKenzie, Mardene Celeste, MD  PARoxetine (PAXIL) 30 MG tablet Take 30 mg by mouth daily.    [provider]  predniSONE (STERAPRED UNI-PAK 21 TAB) 10 MG (21) TBPK tablet Take as directed 10/22/20   Cristie Hem, PA-C  tamsulosin (FLOMAX) 0.4 MG CAPS capsule 1 q HS to aid stone passage 07/31/21   Mancel Bale, MD  traMADol (ULTRAM) 50 MG tablet Take 50 mg by mouth every 6 (six) hours as needed for moderate pain.    [provider]    Family History Family History  Problem Relation Age of Onset   Hypertension Mother    Ulcerative colitis Mother     Social History Social History   Tobacco Use   Smoking status: Former   Smokeless tobacco: Never  Building services engineer Use: Some days   Substances: CBD  Substance Use Topics   Alcohol use: Yes    Comment: occasionally   Drug use: No     Allergies   Sulfa antibiotics   Review of Systems Review of Systems  Constitutional:  Positive for fever.     Physical Exam Triage Vital Signs ED Triage Vitals [03/10/22 1443]  Enc Vitals Group     BP      Pulse      Resp      Temp      Temp src      SpO2      Weight      Height      Head Circumference      Peak Flow      Pain Score 0     Pain Loc      Pain Edu?      Excl. in GC?    No data found.  Updated Vital Signs LMP 02/08/2022 (Approximate)   Visual Acuity Right Eye Distance:   Left Eye Distance:   Bilateral Distance:    Right Eye Near:   Left Eye Near:    Bilateral Near:     Physical Exam Vitals reviewed.  Constitutional:      Appearance: She is well-developed. She is ill-appearing.  HENT:     Mouth/Throat:     Mouth: Mucous membranes are moist.     Pharynx: Posterior oropharyngeal erythema present. No oropharyngeal exudate.  Cardiovascular:     Rate and Rhythm: Normal rate and regular rhythm.     Heart sounds: Normal heart sounds.  Pulmonary:     Effort: Pulmonary effort is normal.     Breath sounds: Normal breath sounds.  Skin:    General: Skin is warm and dry.  Neurological:     General: No focal deficit present.     Mental Status: She is alert and oriented to person, place, and time.  Psychiatric:        Mood and Affect: Mood normal.        Behavior: Behavior normal.      UC Treatments / Results  Labs (all labs ordered are listed, but only abnormal results are displayed) Labs Reviewed - No data to display  EKG   Radiology No results  found.  Procedures Procedures (including critical care time)  Medications Ordered in UC Medications - No data to display  Initial Impression / Assessment and Plan / UC Course  I have reviewed the triage vital signs and  the nursing notes.  Pertinent labs & imaging results that were available during my care of the patient were reviewed by me and considered in my medical decision making (see chart for details).   Patient is afebrile here with recent antipyretics. Satting well on room air. Overall is ill appearing, well hydrated, without respiratory distress. Pulmonary exam is unremarkable.  Lungs CTAB without wheezing, rhonchi, rales.  Pharynx is mildly erythematous without any exudates.  Suspect viral process including influenza.  Symptoms of 4 days meaning she is outside of the treatment window with antiviral treatment and so will continue to recommend use of OTC medication for symptom control.  Final Clinical Impressions(s) / UC Diagnoses   Final diagnoses:  None   Discharge Instructions   None    ED Prescriptions   None    PDMP not reviewed this encounter.   Charma Igo, [place] 03/10/22 1507

## 2022-03-16 ENCOUNTER — Telehealth: Payer: Self-pay | Admitting: Internal Medicine

## 2022-03-16 NOTE — Telephone Encounter (Signed)
Called initial phone number; patient is not accepting calls per VM Message.  Will attempt to reach pt by alternate number provided

## 2022-03-16 NOTE — Telephone Encounter (Signed)
Called patient on alternate number [phone].   Left patient a voicemail message. Requested patient call office back. Office number provided on VM message. Also asked patient to provide a working call back number.

## 2022-03-16 NOTE — Telephone Encounter (Unsigned)
Copied from CRM #[id number]. Topic: Access to Care - Speak to Provider/Office Staff  >> Mar 16, 2022 12:44 PM Uvaldo Bristle wrote:  The patient stated she is hoping her Social worker Corrine can get her a medical cab so she can be seen at an Urgent Care. The patient stated she is hoping to get a 2:00 pick up for any urgent care in [name] area. Writer also sent an activation link for My Chart as the patient stated she has no phone at this time and would need to communicate through My Chart.     Call back [phone]    Alternate Call back: [phone]

## 2022-03-16 NOTE — Telephone Encounter (Signed)
Copied from CRM #[id number]. Topic: Urgent/Emergent - Protocol-Specific Urgent  >> Mar 16, 2022  2:12 PM Sue Lush wrote:  Norrie returning call to nursing and about transportation to urgent care center    [place] unable to take return call    There is a lot of background noise going on unsure if screams or what is going on    States she has a house full of kids    Warm transferred to backline    Needs an appointment    Patient currently on [phone] states is an ex's phone

## 2022-03-16 NOTE — Telephone Encounter (Signed)
Copied from CRM #[id number]. Topic: Access to Care - Speak to Provider/Office Staff  >> Mar 16, 2022 12:40 PM Uvaldo Bristle wrote:  Jenna [name], Patient, calling to report that the patient is experiencing bladder infection, side pain, back pain, vaginal discharge and odor. These symptoms have been going on for 1 week(s)    Patient requesting same day appointment? no  Patient requesting call back? yes    Phone number confirmed at [phone]    Patient stated she has a bad bladder infection and is asking if she can get medication. The patient stated she is hoping her Social worker can get her a medical cab so she can be seen at an Urgent Care. Writer sent an activation code for My Chart to the patients e-mail on file as the patient has no phone at this time.     Alternate Call back: [phone]

## 2022-03-16 NOTE — Telephone Encounter (Signed)
Patient, calling back.  Writer asked for patient to remove speaker phone as Proofreader is stating is echoing. Pt states she cannot do this.  Patient reports that she believes she has a bladder infection. Reports side pain, pain when urinating, back pain, vaginal discharge and odor.   Feels sick.  Asking for medication to treat symptoms.    These symptoms have been going on for 1 week(s)    Denies: n/v/d, fever    Reviewed unable to prescribe medication without evaluation.  Offered to schedule pt for an appt.  Patient agreeable.    Patient [facility]    Pt verbalized understanding and agrees to plan.

## 2022-03-16 NOTE — Telephone Encounter (Signed)
Called patient on alternate number [phone]. Lots of noise background before patient came to phone to speak to patient.  When asking for confirmation of patient name/dob, call was disconnected.

## 2022-03-17 ENCOUNTER — Ambulatory Visit: Payer: Self-pay

## 2022-03-18 ENCOUNTER — Encounter: Payer: Self-pay | Admitting: Internal Medicine

## 2022-03-18 ENCOUNTER — Telehealth: Payer: Self-pay

## 2022-03-18 ENCOUNTER — Ambulatory Visit: Payer: Self-pay | Admitting: Registered Nurse

## 2022-03-18 NOTE — Telephone Encounter (Signed)
Copied from CRM [id number]. Topic: Access to Care - Speak to Provider/Office Staff  >> Mar 18, 2022  2:22 PM Jenna [name] wrote:  Jenna [name], Patient, calling to report that the patient is experiencing skin of feet is discolored and looks gray, vaginal symptoms,side pain, pain when urinating, back pain, vaginal discharge and odor. . These symptoms have been going on for 2 week(s)    Patient requesting same day appointment? yes  Patient requesting call back? yes    Phone number confirmed at [phone]    Patient stated she really needs to be seen as soon as possible and doesn't feel well overall.

## 2022-03-18 NOTE — Telephone Encounter (Signed)
Spoke with patient.    Notes not feeling well, general malaise with UTI like symptoms. Pain and burning with urination. Denies fever at this time.    Offered same day clinic appointment, patient declined, states that she is busy today.    Scheduled for next day assessment. Patient asked that medicaid taxi be set up for appointment.    Writer agreed to task, patient needs +4 additional riders, as children are not yet back in school (no car-seats). Would like [employer] transportation if possible. No walker, wheelchair or cane. Address confirmed.    Attempted to schedule with MAS, received this message:    The enrollee has no coverage - Medicaid ineligible  For more information call the Medicaid Helpline at [phone] or please visit the [facility] Department of Health website:    Will reach out to PSS for resolution.

## 2022-03-18 NOTE — Telephone Encounter (Signed)
Call back [phone]   --not working     Alternate Call back: [phone]      Hiliana Poitra  [id number]  [address]  [place] [address]     REFERRAL FROM:   [facility]  PRESENTING PROBLEM:                 Transportation                 No insurance  ADDITIONAL INFORMATION:     Patient and her children recently relocated from a hotel to an apartment in [place]     Per e-paces it appears that patient failed to recertify her Medicaid in December 2023. Thus her Medicaid is now inactive and she is not eligible for Medicaid transportation      ACTION TAKEN:            Writer called [phone] (4xs-- non functioning phone number), [phone] ( 4xs No answer), [phone]( 4xs No answer, unidentified voicemail left twice with writer's call back # [phone])             Writer in voicemail noted not sure transport to appointment could be obtained and that if she could get to the Urgent Care center not too far from her home she should go tonight.

## 2022-03-19 ENCOUNTER — Ambulatory Visit: Payer: Self-pay

## 2022-03-19 NOTE — Telephone Encounter (Signed)
Please see alternate social work encounter regarding transportation 03/19/2022.

## 2022-04-02 ENCOUNTER — Ambulatory Visit (HOSPITAL_COMMUNITY)
Admission: EM | Admit: 2022-04-02 | Discharge: 2022-04-02 | Disposition: A | Discharge status: Home / Self Care | Payer: Medicaid Other

## 2022-04-02 DIAGNOSIS — F419 Anxiety disorder, unspecified: Secondary | ICD-10-CM | POA: Insufficient documentation

## 2022-04-02 NOTE — Progress Notes (Signed)
   04/02/22 1249  [place] (Walk-ins at [facility] only)  How Did You Hear About [name]? Self  What Is the Reason for Your Visit/Call Today? Patient is a 37 year old that presents this date with ongoing anxiety. Patient denies any S/I, H/I or AVH. 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 Patient reports that she currently receives OP services form Lorin Mercy PA at [facility] who assists with medication management. Patient reports she is currently prescribed Propranolol 10mg , Paxil 30mg  and Mirtazapine 7.5 mg. Patent reports current compliance although feels those medications are not working as indicated. Patient states she has been having daily panic attacks for the last week and frequent episodes of crying. Patient is requesting assistance with medication management. Patient denies any SA issues.  How Long Has This Been Causing You Problems? 1 wk - 1 month  Have You Recently Had Any Thoughts About Hurting Yourself? No  Are You Planning to Commit Suicide/Harm Yourself At This time? No  Have you Recently Had Thoughts About [place]? No  Are You Planning To Harm Someone At This Time? No  Are you currently experiencing any auditory, visual or other hallucinations? No  Have You Used Any Alcohol or Drugs in the Past 24 Hours? No  Do you have any current medical co-morbidities that require immediate attention? No  Clinician description of patient physical appearance/behavior: Patient presents cooperative and is open to treatment interventions  What Do You Feel Would Help You the Most Today? Medication(s)  If access to [facility] was not available, would you have sought care in the Emergency Department? No  Determination of Need Routine (7 days)  Options For Referral Outpatient Therapy

## 2022-04-02 NOTE — ED Provider Notes (Signed)
Behavioral Health Urgent Care Medical Screening Exam  Patient Name: Samantha Nguyen MRN: [id number] Date of Evaluation: 04/02/22 Chief Complaint: "On Christmas day, I was sick with the flu or something. I had a fever. I started having a panic attack."  Diagnosis:  Final diagnoses:  Anxiety disorder, unspecified type   History of Present illness: Samantha Nguyen is a 37 y.o. female. Pt presents voluntarily to [facility] behavioral health for walk-in assessment.  Pt is accompanied by her mother, Samantha Nguyen. Pt is assessed face-to-face by nurse practitioner.   [name], 37 y.o., female patient seen face to face by this provider, consulted with Dr. Dwyane Dee; and chart reviewed on 04/02/22. On evaluation, when asked reason for presenting to this facility today, Samantha Nguyen reports "On Christmas day, I was sick with the flu or something. I had a fever. I started having a panic attack."  Pt reports anxiety has been present since childhood. She states anxiety has been worse since Christmas day. She states during panic attacks she is worried she is going to die. Pt's mother reports several years ago, pt was septic, and was told if she had not gone to the hospital that day, she would have died. She states she believes this is contributing to pt's anxiety and that pt becomes anxious when sick. Pt states she often googles symptoms and becomes worried.  Pt reports anxious mood. She reports poor appetite, eating 1 meal/day. She reports in the past 3 weeks she has lost 15 lbs. She states she weighed herself today and she is 170lbs and her height is 5' 7. She reports fair sleep, sleeping 7 to 12 hours/night.  Pt denies suicidal, homicidal, or violent ideations. She denies auditory visual hallucinations or paranoia.   Pt reports history of non suicidal self injurious behavior, cutting, last occurring "years ago". She denies history of suicide attempt or inpatient psychiatric hospitalization.   Pt reports she  started counseling services this week through virtual therapy. She is seeing Jeani Hawking. She states her next session is on Tuesday. Pt reports she is receiving medication management from her PCP. Her current medications include propanolol 10mg , paxil 30mg , mirtazapine 7.5, and ativan 0.5mg  bid prn. Pt has an upcoming appointment with her PCP next week for annual check up.   Pt denies alcohol, marijuana, nicotine, crack/cocaine, methamphetamine, opioid use.  Pt reports family psychiatric history is positive, grandmother w/ anxiety.  Pt denies access to a firearm or other weapon.  Pt reports legal history of 2 DUIs which have been cleared.  Pt's mother reports pt met developmental milestones on time.  Pt is living with her fiance.  Pt is working within the [employer] as a [employer]. Pt's mother reports pt's job is stressful.   Discussed recommendation to get established with psychiatric provider and to continue with counseling. Appointment was made in session with pt and pt's mother for [place] for 04/08/22 at [facility] for medication management. Pt's next counseling appointment on Tuesday, 04/06/22.  [place] ED from 04/02/2022 in [facility] ED from 03/10/2022 in [facility] at [facility]  ED from 07/31/2021 in [facility] Emergency Department at [place] No Risk No Risk No Risk       Psychiatric Specialty Exam  Presentation  General Appearance:Appropriate for Environment; Casual  Eye Contact:Fair  Speech:Clear and Coherent; Normal Rate  Speech Volume:Normal  Handedness:Right   Mood and Affect  Mood:Anxious  Affect:Tearful  Thought Process  Thought Processes:Coherent; Goal Directed; Linear  Descriptions of Associations:Intact  Orientation:Full (Time, Place and Person)  Thought Content:Logical    Hallucinations:None  Ideas of Reference:None  Suicidal  Thoughts:No  Homicidal Thoughts:No   Sensorium  Memory:Immediate Good; Recent Good; Remote Good  Judgment:Intact  Insight:Fair   Executive Functions  Concentration:Good  Attention Span:Good  [place]  Language:Good   Psychomotor Activity  Psychomotor Activity:Restlessness   Assets  Assets:Communication Skills; Desire for Improvement; Financial Resources/Insurance; Housing; Intimacy; Leisure Time; Physical Health; Resilience; Social Support; Vocational/Educational   Sleep  Sleep:Fair  Number of hours: 0 (7 to 12 hours/night)   No data recorded  Physical Exam: Physical Exam Constitutional:      General: She is not in acute distress.    Appearance: She is not ill-appearing, toxic-appearing or diaphoretic.  Eyes:     General: No scleral icterus. Cardiovascular:     Rate and Rhythm: Normal rate.  Pulmonary:     Effort: Pulmonary effort is normal. No respiratory distress.  Neurological:     Mental Status: She is alert and oriented to person, place, and time.  Psychiatric:        Attention and Perception: Attention and perception normal.        Mood and Affect: Mood is anxious. Affect is tearful.        Speech: Speech normal.        Behavior: Behavior normal. Behavior is cooperative.        Thought Content: Thought content normal.        Cognition and Memory: Cognition and memory normal.        Judgment: Judgment normal.    Review of Systems  Constitutional:  Negative for chills and fever.  Respiratory:  Negative for shortness of breath.   Cardiovascular:  Negative for chest pain and palpitations.  Gastrointestinal:  Negative for abdominal pain.  Neurological:  Negative for headaches.  Psychiatric/Behavioral:  The patient is nervous/anxious.    Blood pressure (!) 140/84, pulse 100, temperature 98.1 F (36.7 C), temperature source Oral, resp. rate 18, last menstrual period 02/08/2022, SpO2 100 %. There is no height or weight  on file to calculate BMI.  Musculoskeletal: Strength & Muscle Tone: within normal limits Gait & Station: normal Patient leans: N/A   [place] MSE Discharge Disposition for Follow up and Recommendations: Based on my evaluation the patient does not appear to have an emergency medical condition and can be discharged with resources and follow up care in outpatient services for Medication Management and Individual Therapy   Tharon Aquas, NP 04/02/2022, 5:14 PM

## 2022-04-02 NOTE — Discharge Instructions (Signed)
Follow up with established outpatient counseling. You are scheduled for a new patient medication management appointment at [facility] on 04/08/2022 at [place]. Please call them at [phone] for any questions.

## 2022-04-08 ENCOUNTER — Other Ambulatory Visit: Payer: Self-pay

## 2022-04-09 ENCOUNTER — Telehealth: Payer: Self-pay | Admitting: Internal Medicine

## 2022-04-09 NOTE — Telephone Encounter (Signed)
Attempted to contact patient to schedule appointment with office, but call would not go through. Will send message back to team regarding next steps.

## 2022-04-14 ENCOUNTER — Telehealth: Payer: Self-pay | Admitting: Internal Medicine

## 2022-04-14 NOTE — Telephone Encounter (Signed)
Pt is in need of assistance with transportation to upcoming appointment.  Pt's medicaid lapsed and is currently not active so pt is unable to utilize MAS.  SW called [employer] and confirmed that that would be able to accommodate the trip.  SW completed and submitted transportation voucher to [employer].  SW informed pt's [facility] of transportation details for pt and [facility] informed the pt.    Transportation Scheduled  Transportation Vendor: [employer]  [place] Date: 04/15/22  Pickup Location: [address] [place], [place] [address]  Pickup Time: 1:45 pm  Appt Location: [address] [place], [place] [address]  Appt Time: 2:40 pm  Payer: SW  Invoice: N/A    Arline Asp LMSW  [facility] Internal Medicine Social Worker  [phone]

## 2022-04-14 NOTE — Telephone Encounter (Signed)
Copied from CRM #[id number]. Topic: Information Request - Transportation Information  >> Apr 14, 2022  2:28 PM Elnita Maxwell wrote:  Patient is requesting transportation to their upcoming appointment on:   04/15/2022 at 2:40 pm located at the office SIM with [facility], NP     Is the patient experiencing any symptoms? Yes  If yes, the symptoms are: tongue dry and heel of foot is very painful.    Pick up patient at (verified) home address? Yes   Confirmation of pick up address is: [address] [place] [address]       Special accommodations needed (walker, wheelchair, stretcher and/or type of vehicle)? No, but additional 4 riders   Will they need any assistance from the driver from the door of the home to the vehicle (please specify if there are steps)? no      Preferred transportation company?  [employer]     Patient contact number Telephone Information:  [phone]

## 2022-04-15 ENCOUNTER — Ambulatory Visit: Payer: Self-pay | Admitting: Adult Health

## 2022-04-19 NOTE — Progress Notes (Signed)
CM called pt to check in; phone not working at this time. CM will try back.    [facility]  O: [phone]  C: [phone]

## 2022-04-21 ENCOUNTER — Emergency Department (HOSPITAL_COMMUNITY): Payer: Medicaid Other

## 2022-04-21 ENCOUNTER — Emergency Department (HOSPITAL_COMMUNITY)
Admission: EM | Admit: 2022-04-21 | Discharge: 2022-04-21 | Disposition: A | Discharge status: Home / Self Care | Payer: Medicaid Other | Attending: Emergency Medicine | Admitting: Emergency Medicine

## 2022-04-21 ENCOUNTER — Other Ambulatory Visit: Payer: Self-pay

## 2022-04-21 ENCOUNTER — Encounter (HOSPITAL_COMMUNITY): Payer: Self-pay

## 2022-04-21 DIAGNOSIS — Z87891 Personal history of nicotine dependence: Secondary | ICD-10-CM | POA: Insufficient documentation

## 2022-04-21 DIAGNOSIS — F410 Panic disorder [episodic paroxysmal anxiety] without agoraphobia: Secondary | ICD-10-CM | POA: Diagnosis not present

## 2022-04-21 DIAGNOSIS — R000 Tachycardia, unspecified: Secondary | ICD-10-CM

## 2022-04-21 LAB — CBC WITH DIFFERENTIAL/PLATELET
Abs Immature Granulocytes: 0.04 10*3/uL (ref 0.00–0.07)
Basophils Absolute: 0 10*3/uL (ref 0.0–0.1)
Basophils Relative: 0 %
Eosinophils Absolute: 0 10*3/uL (ref 0.0–0.5)
Eosinophils Relative: 0 %
HCT: 41.8 % (ref 36.0–46.0)
Hemoglobin: 14.1 g/dL (ref 12.0–15.0)
Immature Granulocytes: 0 %
Lymphocytes Relative: 16 %
Lymphs Abs: 1.8 10*3/uL (ref 0.7–4.0)
MCH: 28.9 pg (ref 26.0–34.0)
MCHC: 33.7 g/dL (ref 30.0–36.0)
MCV: 85.7 fL (ref 80.0–100.0)
Monocytes Absolute: 0.5 10*3/uL (ref 0.1–1.0)
Monocytes Relative: 4 %
Neutro Abs: 9 10*3/uL — ABNORMAL HIGH (ref 1.7–7.7)
Neutrophils Relative %: 80 %
Platelets: 393 10*3/uL (ref 150–400)
RBC: 4.88 MIL/uL (ref 3.87–5.11)
RDW: 13 % (ref 11.5–15.5)
WBC: 11.3 10*3/uL — ABNORMAL HIGH (ref 4.0–10.5)
nRBC: 0 % (ref 0.0–0.2)

## 2022-04-21 LAB — I-STAT BETA HCG BLOOD, ED (MC, WL, AP ONLY): I-stat hCG, quantitative: <5 m[IU]/mL (ref <5)

## 2022-04-21 LAB — BASIC METABOLIC PANEL
Anion gap: 13 (ref 5–15)
BUN: 6 mg/dL (ref 6–20)
CO2: 20 mmol/L — ABNORMAL LOW (ref 22–32)
Calcium: 9.9 mg/dL (ref 8.9–10.3)
Chloride: 103 mmol/L (ref 98–111)
Creatinine, Ser: 0.81 mg/dL (ref 0.44–1.00)
GFR, Estimated: >60 mL/min (ref >60)
Glucose, Bld: 100 mg/dL — ABNORMAL HIGH (ref 70–99)
Potassium: 3.7 mmol/L (ref 3.5–5.1)
Sodium: 136 mmol/L (ref 135–145)

## 2022-04-21 LAB — TROPONIN I (HIGH SENSITIVITY)
Troponin I (High Sensitivity): 3 ng/L (ref <18)
Troponin I (High Sensitivity): 3 ng/L (ref <18)

## 2022-04-21 LAB — T4, FREE: Free T4: 1.15 ng/dL — ABNORMAL HIGH (ref 0.61–1.12)

## 2022-04-21 LAB — D-DIMER, QUANTITATIVE: D-Dimer, Quant: 0.35 ug/mL-FEU (ref 0.00–0.50)

## 2022-04-21 MED ORDER — LORAZEPAM 1 MG PO TABS
0.5000 mg | ORAL_TABLET | Freq: Once | ORAL | Status: AC
  Administered 2022-04-21: 0.5 mg via ORAL
  Filled 2022-04-21: qty 1

## 2022-04-21 MED ORDER — ALPRAZOLAM 0.25 MG PO TABS
0.5000 mg | ORAL_TABLET | Freq: Once | ORAL | Status: AC
Start: 2022-04-21 — End: 2022-04-21
  Administered 2022-04-21: 0.5 mg via ORAL
  Filled 2022-04-21: qty 2

## 2022-04-21 MED ORDER — HYDROXYZINE HCL 25 MG PO TABS
50.0000 mg | ORAL_TABLET | Freq: Once | ORAL | Status: AC
  Administered 2022-04-21: 50 mg via ORAL
  Filled 2022-04-21: qty 2

## 2022-04-21 NOTE — Progress Notes (Signed)
   04/21/22 1648  Spiritual Encounters  Type of Visit Initial  Care provided to: Pt and family  Referral source Nurse (RN/NT/LPN)  Reason for visit Urgent spiritual support  OnCall Visit No  Spiritual Framework  Patient Stress Factors Other (Comment) (fear/anxiety)  Interventions  Spiritual Care Interventions Made Established relationship of care and support;Compassionate presence;Reflective listening;Normalization of emotions;Explored values/beliefs/practices/strengths;Prayer  Intervention Outcomes  Outcomes Reduced anxiety;Reduced fear   Chaplain responded to a request for care. Patient was experiencing some fear and anxiety. Chaplain provided compassionate presence, reflective listening and prayer.  Chaplain let the patient know that chaplain services are available 24/7 and that she can request a chaplain via her nurse.    Note prepared by Abbott Pao, [place] Resident [phone]

## 2022-04-21 NOTE — ED Notes (Signed)
Pastor care at bedside along with a family member

## 2022-04-21 NOTE — ED Notes (Signed)
Pt is extremely anxious and crying

## 2022-04-21 NOTE — ED Provider Notes (Signed)
  Physical Exam  BP 121/82   Pulse (!) 119   Temp 98 F (36.7 C) (Oral)   Resp 16   Ht 5\' 7"  (1.702 m)   Wt 75.3 kg   LMP 04/14/2022 (Approximate)   SpO2 99%   BMI 26.00 kg/m   Physical Exam  Procedures  Procedures  ED Course / MDM    Medical Decision Making Amount and/or Complexity of Data Reviewed Labs: ordered. Radiology: ordered.  Risk Prescription drug management.   Received care of patient from Dr. Rogene Houston at [facility]. Please see his note for prior history and physical exam. Briefly, this is a 37yo female who presents with concern for palpitations.   Labs completed and personally evaluated by me show negative pregnancy test, normal delta troponin with low suspicion for ACS or myocarditis, ddimer negative and doubt PE, ECG with sinus tachycardia without signs of arrhythmia.  Reviewed recent TSH/free T4 from PCP wnl and free T4 here near normal range.  No new medications, drug use, doubt serotonin syndrome. By history it sounds like anxiety precedes her palpitations and then palpitations exacerbate her anxiety.  She was symptomatic on arrival to the ED with ECG not showing other significant arrhythmia.  Recommend continued PCP and psychiatry evaluation, can consider outpatient holter for continued evaluation/reassurance. Patient discharged in stable condition with understanding of reasons to return.        Gareth Morgan, MD 04/22/22 [phone]

## 2022-04-21 NOTE — ED Notes (Signed)
When patient is crying hr goes to 130s but when she relaxes it comes down to low 100.  Patient also complains of difficulty eating and abd pain.  Took 1/2 xanax this morning to try and help her anxieyt.

## 2022-04-21 NOTE — ED Provider Notes (Signed)
[facility] Provider Note   CSN: [id number] Arrival date & time: 04/21/22  0263     History  Chief Complaint  Patient presents with   Panic Attack    Samantha Nguyen is a 37 y.o. female.  Patient has been diagnosed with anxiety and panic attacks.  Patient with a complaint of chest pain and shortness of breath.  And palpitations.  Patient seen in urgent care yesterday said EKG was normal.  Patient took a half of Xanax this morning to try to help.  She is also currently under care of a cardiologist to help with the anxiety and panic attacks.  Past medical history significant for migraines anxiety depression history of kidney stones.  Patient is a former smoker.       Home Medications Prior to Admission medications   Medication Sig Start Date End Date Taking? Authorizing Provider  ALPRAZolam Duanne Moron) 0.5 MG tablet Take 1 tablet (0.5 mg total) by mouth 3 (three) times daily as needed for anxiety. Patient not taking: Reported on 04/02/2022 05/31/21   Milton Ferguson, MD  diclofenac (VOLTAREN) 75 MG EC tablet Take 1 tablet (75 mg total) by mouth 2 (two) times daily as needed. Do not take until finished with steroid taper Patient not taking: Reported on 04/02/2022 10/22/20   Aundra Dubin, PA-C  fexofenadine (ALLEGRA) 180 MG tablet Take 180 mg by mouth daily as needed for allergies. 12/07/21   [provider]  fluticasone (FLONASE) 50 MCG/ACT nasal spray Place 1 spray into both nostrils daily. Patient taking differently: Place 1 spray into both nostrils as needed for rhinitis or allergies. 05/08/17   Zigmund Gottron, NP  HYDROcodone-acetaminophen (NORCO) 5-325 MG tablet Take 1 tablet by mouth every 4 (four) hours as needed for moderate pain or severe pain. Patient not taking: Reported on 04/02/2022 07/31/21   Daleen Bo, MD  LORazepam (ATIVAN) 0.5 MG tablet Take 0.5 mg by mouth as needed for anxiety.    [provider]   mirtazapine (REMERON) 7.5 MG tablet Take 7.5 mg by mouth at bedtime.    [provider]  Multiple Vitamins-Minerals (MULTIVITAMIN ADULT) TABS Take 1 tablet by mouth daily.    [provider]  PARoxetine (PAXIL) 30 MG tablet Take 30 mg by mouth daily.    [provider]  predniSONE (STERAPRED UNI-PAK 21 TAB) 10 MG (21) TBPK tablet Take as directed Patient not taking: Reported on 04/02/2022 10/22/20   Aundra Dubin, PA-C  propranolol (INDERAL) 10 MG tablet Take 10 mg by mouth daily.    [provider]  tamsulosin (FLOMAX) 0.4 MG CAPS capsule 1 q HS to aid stone passage Patient not taking: Reported on 04/02/2022 07/31/21   Daleen Bo, MD      Allergies    Sulfa antibiotics    Review of Systems   Review of Systems  Constitutional:  Negative for chills and fever.  HENT:  Negative for ear pain and sore throat.   Eyes:  Negative for pain and visual disturbance.  Respiratory:  Positive for shortness of breath. Negative for cough.   Cardiovascular:  Positive for chest pain and palpitations.  Gastrointestinal:  Negative for abdominal pain and vomiting.  Genitourinary:  Negative for dysuria and hematuria.  Musculoskeletal:  Negative for arthralgias and back pain.  Skin:  Negative for color change and rash.  Neurological:  Negative for seizures and syncope.  Psychiatric/Behavioral:  Negative for suicidal ideas. The patient is nervous/anxious.  All other systems reviewed and are negative.   Physical Exam Updated Vital Signs BP (!) 133/99   Pulse (!) 112   Temp 98.2 F (36.8 C) (Oral)   Resp 20   Ht 1.702 m (5\' 7" )   Wt 75.3 kg   LMP 04/14/2022 (Approximate)   SpO2 100%   BMI 26.00 kg/m  Physical Exam Vitals and nursing note reviewed.  Constitutional:      General: She is not in acute distress.    Appearance: Normal appearance. She is well-developed.  HENT:     Head: Normocephalic and atraumatic.  Eyes:     Extraocular Movements:  Extraocular movements intact.     Conjunctiva/sclera: Conjunctivae normal.     Pupils: Pupils are equal, round, and reactive to light.  Cardiovascular:     Rate and Rhythm: Regular rhythm. Tachycardia present.     Heart sounds: No murmur heard. Pulmonary:     Effort: Pulmonary effort is normal. No respiratory distress.     Breath sounds: Normal breath sounds. No wheezing, rhonchi or rales.  Abdominal:     Palpations: Abdomen is soft.     Tenderness: There is no abdominal tenderness.  Musculoskeletal:        General: No swelling.     Cervical back: Neck supple.     Right lower leg: No edema.     Left lower leg: No edema.  Skin:    General: Skin is warm and dry.     Capillary Refill: Capillary refill takes less than 2 seconds.  Neurological:     General: No focal deficit present.     Mental Status: She is alert and oriented to person, place, and time.  Psychiatric:        Mood and Affect: Mood normal.     ED Results / Procedures / Treatments   Labs (all labs ordered are listed, but only abnormal results are displayed) Labs Reviewed  CBC WITH DIFFERENTIAL/PLATELET  BASIC METABOLIC PANEL  TROPONIN I (HIGH SENSITIVITY)    EKG None  Radiology No results found.  Procedures Procedures    Medications Ordered in ED Medications  ALPRAZolam (XANAX) tablet 0.5 mg (has no administration in time range)    ED Course/ Medical Decision Making/ A&P                             Medical Decision Making Amount and/or Complexity of Data Reviewed Labs: ordered. Radiology: ordered.  Risk Prescription drug management.   Symptoms most likely secondary to anxiety and panic attacks.  But will do workup to include labs chest x-ray.  EKG first 1 showed a short PR.  Repeat EKG basically normal.  Will reevaluate after labs.  Patient also given second dose of her Xanax.   Final Clinical Impression(s) / ED Diagnoses Final diagnoses:  Panic attack    Rx / DC Orders ED  Discharge Orders     None         Fredia Sorrow, MD 04/21/22 1010

## 2022-04-21 NOTE — ED Notes (Signed)
Sent 2nd trop straight stick to [facility]

## 2022-04-21 NOTE — ED Notes (Signed)
Pastoral care called to visit pt

## 2022-04-21 NOTE — ED Triage Notes (Signed)
Patient tearful in triage reports over the past month she has felt like she is dying and she is scared.  Reports she has taken all types of anxiety meds and nothing helps.  Reports her heart races and it scares her. Denies SI/HI.  She reports MD keeps tell her that her heart is fine.

## 2022-04-26 ENCOUNTER — Telehealth (HOSPITAL_COMMUNITY): Payer: Self-pay | Admitting: Professional

## 2022-04-28 ENCOUNTER — Other Ambulatory Visit: Payer: Self-pay

## 2022-04-28 ENCOUNTER — Ambulatory Visit (HOSPITAL_COMMUNITY): Payer: Medicaid Other

## 2022-04-28 ENCOUNTER — Ambulatory Visit (HOSPITAL_COMMUNITY): Payer: Medicaid Other | Admitting: Licensed Clinical Social Worker

## 2022-04-28 DIAGNOSIS — F322 Major depressive disorder, single episode, severe without psychotic features: Secondary | ICD-10-CM

## 2022-04-28 DIAGNOSIS — F4521 Hypochondriasis: Secondary | ICD-10-CM | POA: Insufficient documentation

## 2022-04-28 HISTORY — DX: Major depressive disorder, single episode, severe without psychotic features: F32.2
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
## 2022-04-28 NOTE — Progress Notes (Signed)
   04/28/22 1342  Patient-Centered Profile  PCP Completed On 04/28/22  Plan Meeting Date 04/28/22  Effective Date 04/28/22  What People Like and [place] About? "My friends say that I'm kind, I'm creative."  What's Important to? "To not die, to live a long life. To have a fulfiling life, to do things that I want to do in my life."  How Best to Support? "Help me get better." Pt will attend PHP Monday through Friday for 2-3 weeks.  Add What's Working / [employer] Not Working? "The Paxil seemed to help. Therapy always seemed to help. Talking to people and seeing people. Having someone there to talk to."  PCP Action Plan  Long Range Outcome Pt will report increased ability to manage depression and anxiety symptoms using coping skills at least 3x a day. Pt will report increased ability to catch, challenge, change cognitive distortions at least 3x a day.   Pt agrees to tx plan

## 2022-04-29 ENCOUNTER — Ambulatory Visit (INDEPENDENT_AMBULATORY_CARE_PROVIDER_SITE_OTHER): Payer: Medicaid Other | Admitting: Licensed Clinical Social Worker

## 2022-04-29 ENCOUNTER — Encounter: Payer: Self-pay | Admitting: Nurse Practitioner

## 2022-04-29 ENCOUNTER — Encounter (HOSPITAL_COMMUNITY): Payer: Self-pay

## 2022-04-29 ENCOUNTER — Ambulatory Visit (HOSPITAL_COMMUNITY): Payer: Medicaid Other

## 2022-04-29 DIAGNOSIS — F4310 Post-traumatic stress disorder, unspecified: Secondary | ICD-10-CM

## 2022-04-29 DIAGNOSIS — F411 Generalized anxiety disorder: Secondary | ICD-10-CM

## 2022-04-29 DIAGNOSIS — F331 Major depressive disorder, recurrent, moderate: Secondary | ICD-10-CM

## 2022-04-29 DIAGNOSIS — F4521 Hypochondriasis: Secondary | ICD-10-CM | POA: Diagnosis not present
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
## 2022-04-30 ENCOUNTER — Ambulatory Visit (INDEPENDENT_AMBULATORY_CARE_PROVIDER_SITE_OTHER): Payer: Medicaid Other | Admitting: Licensed Clinical Social Worker

## 2022-04-30 ENCOUNTER — Other Ambulatory Visit: Payer: Self-pay | Admitting: Nurse Practitioner

## 2022-04-30 ENCOUNTER — Ambulatory Visit (HOSPITAL_COMMUNITY): Payer: Medicaid Other

## 2022-04-30 ENCOUNTER — Encounter (HOSPITAL_COMMUNITY): Payer: Self-pay

## 2022-04-30 DIAGNOSIS — F411 Generalized anxiety disorder: Secondary | ICD-10-CM

## 2022-04-30 DIAGNOSIS — F331 Major depressive disorder, recurrent, moderate: Secondary | ICD-10-CM

## 2022-04-30 NOTE — Progress Notes (Signed)
Virtual Visit via Video Note  I connected with Samantha Nguyen on 04/30/22 at  9:00 AM EST by a video enabled telemedicine application and verified that I am speaking with the correct person using two identifiers.  Location: Patient: Home Provider: Office   I discussed the limitations of evaluation and management by telemedicine and the availability of in person appointments. The patient expressed understanding and agreed to proceed.  I discussed the assessment and treatment plan with the patient. The patient was provided an opportunity to ask questions and all were answered. The patient agreed with the plan and demonstrated an understanding of the instructions.   The patient was advised to call back or seek an in-person evaluation if the symptoms worsen or if the condition fails to improve as anticipated.  I provided 15 minutes of non-face-to-face time during this encounter.   Derrill Center, NP    Psychiatric Initial Adult Assessment - PHP- [place]  Patient Identification: Samantha Nguyen MRN:  [id number] Date of Evaluation:  04/30/2022 Referral Source: [facility]  Chief Complaint:  "  I am struggling with a lot of stress and anxiety related to my physical health."  Visit Diagnosis:    ICD-10-CM   1. GAD (generalized anxiety disorder)  F41.1     2. MDD (major depressive disorder), recurrent episode, moderate (HCC)  F33.1       History of Present Illness:  Samantha Nguyen 37 year old Caucasian female was referred by [facility] urgent care due to increased anxiety. She reported a history of generalized anxiety disorder, major depressive disorder and panic attacks.    Samantha Nguyen she reports she was diagnosed with kidney stones in 2018.  States she was initially seen and evaluated at the local emergency department where antibiotics states she continues to have pain then was referred urology where they discovered that she was septic due to kidney stones.  She reports  constant worry about " if I am going to die."  States her anxiety symptoms have been heightened since this past Christmas.  Reports ongoing ruminations related to impending doom.  States she has been taking Paxil since the age of 43.  States she is unsure if medication is helping with her symptoms.  States she was recently advised to start Inderal to help alleviate some of the anxiety which she reports. "  I could not feel anything, and I was still panicking."  States she would like to discontinue Inderal at this time.  Reports she discussed initiating Seroquel however due to the reported side effects she is not interested in starting that medication at this time.  Samantha Nguyen denied illicit drug use or substance abuse history.  States she is currently employed by [employer].  Reports she recently reestablished care with therapy and psychiatry at [facility] urgent care.  States her psychiatrist relocated to [facility].  Patient was offered hydroxyzine however was reluctant to plan.  Samantha Nguyen denied previous inpatient admissions.  Does report a history of self injures behaviors such as burning and cutting.  States she has not done that since 2014.  Reports she is married and her husband is supportive.  Patient to start partial hospitalization programming through [facility].    Samantha Nguyen is sitting she is alert/oriented x 4; calm/cooperative; and mood congruent with affect.  Patient is speaking in a clear tone at moderate volume, and normal pace; with good eye contact.  Her thought process is coherent and relevant; There is no  indication that she is currently responding to internal/external stimuli or experiencing delusional thought content.  Patient denies suicidal/self-harm/homicidal ideation, psychosis, and paranoia.  Patient has remained calm throughout assessment and has answered questions appropriately.      Associated Signs/Symptoms: Depression Symptoms:   depressed mood, difficulty concentrating, anxiety, panic attacks, (Hypo) Manic Symptoms:  Irritable Mood, Anxiety Symptoms:  Excessive Worry, Social Anxiety, Psychotic Symptoms:  Paranoia, PTSD Symptoms: Had a traumatic exposure:  related to health diagnosis.   Past Psychiatric History: see HPI  Previous Psychotropic Medications: No   Substance Abuse History in the last 12 months:  No.  Consequences of Substance Abuse: NA  Past Medical History:  Past Medical History:  Diagnosis Date   Anxiety    Depressed    History of kidney stones    Migraine    Renal disorder     Past Surgical History:  Procedure Laterality Date   CESAREAN SECTION     CYSTOSCOPY W/ URETERAL STENT PLACEMENT Right 08/24/2016   Procedure: CYSTOSCOPY WITH RETROGRADE PYELOGRAM/URETERAL STENT PLACEMENT;  Surgeon: Cleon Gustin, MD;  Location: [facility];  Service: Urology;  Laterality: Right;   CYSTOSCOPY WITH RETROGRADE PYELOGRAM, URETEROSCOPY AND STENT PLACEMENT Right 09/09/2016   Procedure: CYSTOSCOPY WITH RETROGRADE PYELOGRAM, URETEROSCOPY AND STENT REPLACEMENT;  Surgeon: Cleon Gustin, MD;  Location: [facility];  Service: Urology;  Laterality: Right;   MANDIBLE SURGERY      Family Psychiatric History:   Family History:  Family History  Problem Relation Age of Onset   Hypertension Mother    Ulcerative colitis Mother    Alcohol abuse Father    Drug abuse Father    Anxiety disorder Father    Drug abuse Brother    Anxiety disorder Maternal Grandfather     Social History:   Social History   Socioeconomic History   Marital status: Single    Spouse name: Not on file   Number of children: 1   Years of education: Not on file   Highest education level: Some college, no degree  Occupational History   Not on file  Tobacco Use   Smoking status: Former   Smokeless tobacco: Never  Scientific laboratory technician Use: Some days   Substances: CBD  Substance and Sexual Activity    Alcohol use: Yes    Comment: occasionally   Drug use: No   Sexual activity: Not on file  Other Topics Concern   Not on file  Social History Narrative   Not on file   Social Determinants of Health   Financial Resource Strain: Not on file  Food Insecurity: Not on file  Transportation Needs: Not on file  Physical Activity: Not on file  Stress: Not on file  Social Connections: Not on file    Additional Social History:   Allergies:   Allergies  Allergen Reactions   Sulfa Antibiotics Other (See Comments)    Unknown, happened when she was a child    Metabolic Disorder Labs: No results found for: "HGBA1C", "MPG" No results found for: "PROLACTIN" No results found for: "CHOL", "TRIG", "HDL", "CHOLHDL", "VLDL", "LDLCALC" Lab Results  Component Value Date   TSH 1.006 05/31/2021    Therapeutic Level Labs: No results found for: "LITHIUM" No results found for: "CBMZ" No results found for: "VALPROATE"  Current Medications: Current Outpatient Medications  Medication Sig Dispense Refill   ALPRAZolam (XANAX) 0.5 MG tablet Take 1 tablet (0.5 mg total) by mouth 3 (three) times daily as needed for anxiety.  30 tablet 0   fexofenadine (ALLEGRA) 180 MG tablet Take 180 mg by mouth daily as needed for allergies.     fluticasone (FLONASE) 50 MCG/ACT nasal spray Place 1 spray into both nostrils daily. (Patient taking differently: Place 1 spray into both nostrils as needed for rhinitis or allergies.) 16 g 2   LORazepam (ATIVAN) 0.5 MG tablet Take 0.5 mg by mouth as needed for anxiety.     PARoxetine (PAXIL) 30 MG tablet Take 30 mg by mouth daily.     propranolol (INDERAL) 10 MG tablet Take 10 mg by mouth daily.     diclofenac (VOLTAREN) 75 MG EC tablet Take 1 tablet (75 mg total) by mouth 2 (two) times daily as needed. Do not take until finished with steroid taper (Patient not taking: Reported on 04/02/2022) 60 tablet 0   HYDROcodone-acetaminophen (NORCO) 5-325 MG tablet Take 1 tablet by mouth  every 4 (four) hours as needed for moderate pain or severe pain. (Patient not taking: Reported on 04/02/2022) 20 tablet 0   Multiple Vitamins-Minerals (MULTIVITAMIN ADULT) TABS Take 1 tablet by mouth daily. (Patient not taking: Reported on 04/30/2022)     predniSONE (STERAPRED UNI-PAK 21 TAB) 10 MG (21) TBPK tablet Take as directed (Patient not taking: Reported on 04/02/2022) 21 tablet 0   tamsulosin (FLOMAX) 0.4 MG CAPS capsule 1 q HS to aid stone passage (Patient not taking: Reported on 04/02/2022) 7 capsule 0   No current facility-administered medications for this visit.    Musculoskeletal: Strength & Muscle Tone: within normal limits Gait & Station: normal Patient leans: N/A  Psychiatric Specialty Exam: Review of Systems  Psychiatric/Behavioral:  The patient is nervous/anxious.   All other systems reviewed and are negative.   Last menstrual period 04/14/2022.There is no height or weight on file to calculate BMI.  General Appearance: Casual tearful throughout this assessment  Eye Contact:  Good  Speech:  Clear and Coherent  Volume:  Normal  Mood:  Anxious and Depressed  Affect:  Congruent  Thought Process:  Coherent  Orientation:  Full (Time, Place, and Person)  Thought Content:  Logical  Suicidal Thoughts:  No  Homicidal Thoughts:  No  Memory:  Immediate;   Good  Judgement:  Fair  Insight:  Good  Psychomotor Activity:  Normal  Concentration:  Concentration: Good  Recall:  Good  Fund of Knowledge:Good  Language: Good  Akathisia:  NA  Handed:  Right  AIMS (if indicated):  done  Assets:  Desire for Improvement Social Support  ADL's:  Intact  Cognition: WNL  Sleep:  Good   Screenings: GAD-7    Health and safety inspector from 04/28/2022 in [facility]  Total GAD-7 Score 19      PHQ2-9    Flowsheet Row Counselor from 04/30/2022 in [name] Counselor from 04/28/2022 in [facility]   PHQ-2 Total Score 6 6  PHQ-9 Total Score 24 20      Flowsheet Row Counselor from 04/30/2022 in [facility] Counselor from 04/28/2022 in [facility] ED from 04/21/2022 in [facility] at [place] Error: Question 6 not populated Error: Question 2 not populated No Risk       Assessment and Plan:  -Patient to start Partial hospitalization program in [facility] urgent care  -Continue Paxil 20 mg p.o. daily  -Discussed discontinuing Inderal 10 mg  due to reported side effects.  -  Samantha Nguyen declined initiating Seroquel 25 mg as was discharged by she and MD Pahayan.    she reports " I read the side effects and I am scared to take that medication.  -This provider offered to initiate hydroxyzine 10 mg as needed and/or gabapentin 100 mg p.o. twice daily. (States she will follow-up after doing research with medication side effects.)   Collaboration of Care: Medication Management AEB Paxil and Inderal  and Psychiatrist AEB Paryashan   Patient/Guardian was advised Release of Information must be obtained prior to any record release in order to collaborate their care with an outside provider. Patient/Guardian was advised if they have not already done so to contact the registration department to sign all necessary forms in order for [name] to release information regarding their care.   Consent: Patient/Guardian gives verbal consent for treatment and assignment of benefits for services provided during this visit. Patient/Guardian expressed understanding and agreed to proceed.   Treatment options and alternatives reviewed with patient and patient understands the above plan.  Treatment plan was reviewed and agreed upon by NP T. Bobby Rumpf and patient Samantha Nguyen need for group service   Derrill Center, NP 2/16/202412:43 PM

## 2022-04-30 NOTE — Progress Notes (Unsigned)
Spoke with patient via WebEx video call, used 2 identifiers to correctly identify patient. States that groups are going OK. This is her first time in [facility] as referred by urgent care. Has severe health anxiety worried that she is dying. Constantly worries that sometime is wrong. Has been to the ER, urgent care and her PCP multiple times since Jan. It all started after Christmas when she was feeling bad and was told everything was fine by 2 different Doctors. She then woke up feeling worse and had sepsis and a kidney stone. Had to be hospitalized and surgery was performed. She now does not trust when she is told nothing is wrong. Did take her Inderal 8m last night and felt awful. Very tearful about dying. She is suppose to get married in June. On scale 1-10 as 10 being worst she rates anxiety at 5 and can't rate depression. "I don't know." Denies SI/HI or AV hallucinations. PHQ9=24. No other issues or complaints.

## 2022-04-30 NOTE — Telephone Encounter (Signed)
Please see if you message her she will be able to see it

## 2022-05-01 IMAGING — CR DG CHEST 2V
2 series · 2 of 2 positions shown · non-contrast
Comparison: 04/04/2016

CLINICAL DATA: Chest pain. Panic attacks with chest pain and
tightness.

EXAM:
CHEST - 2 VIEW

[w chest pa]
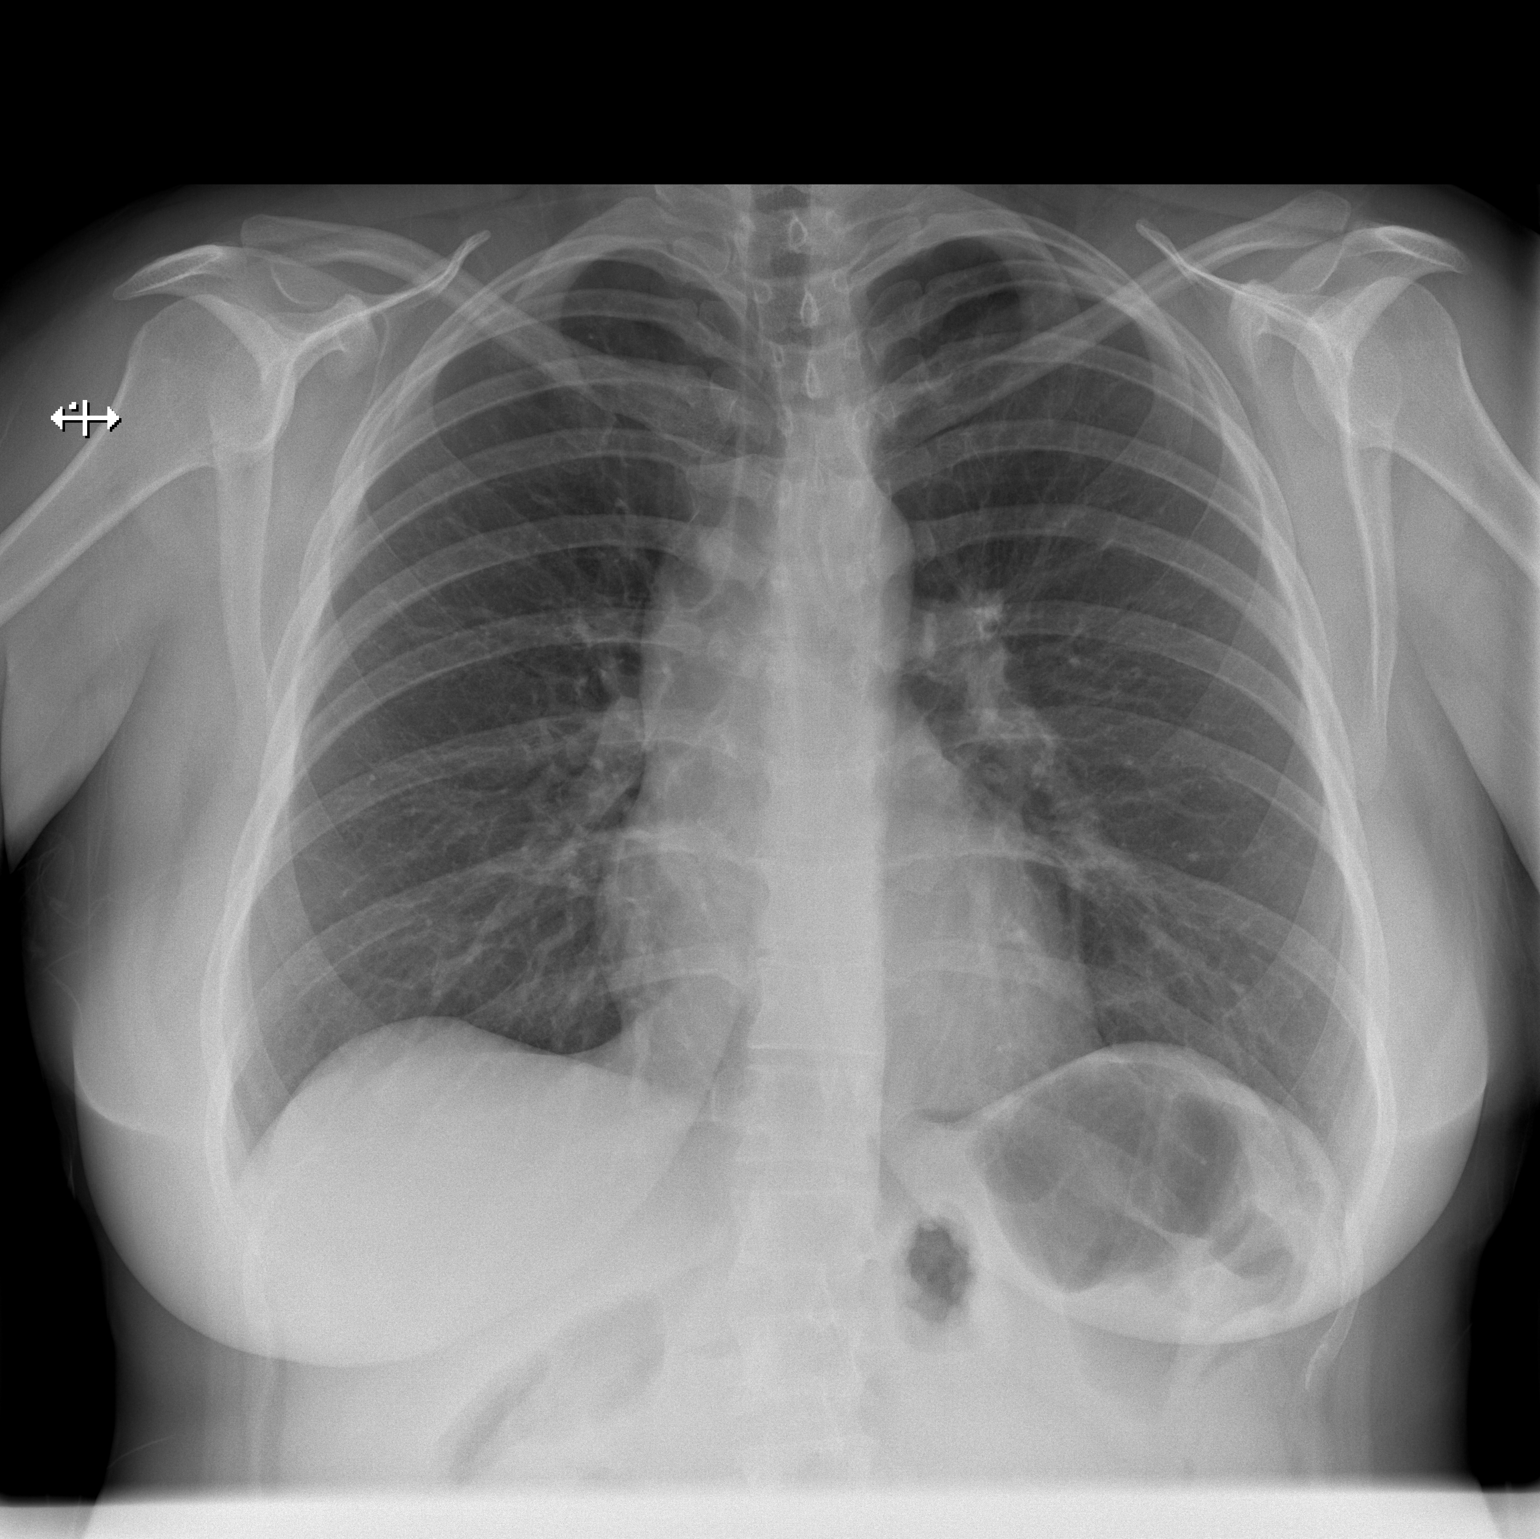

[w chest lat]
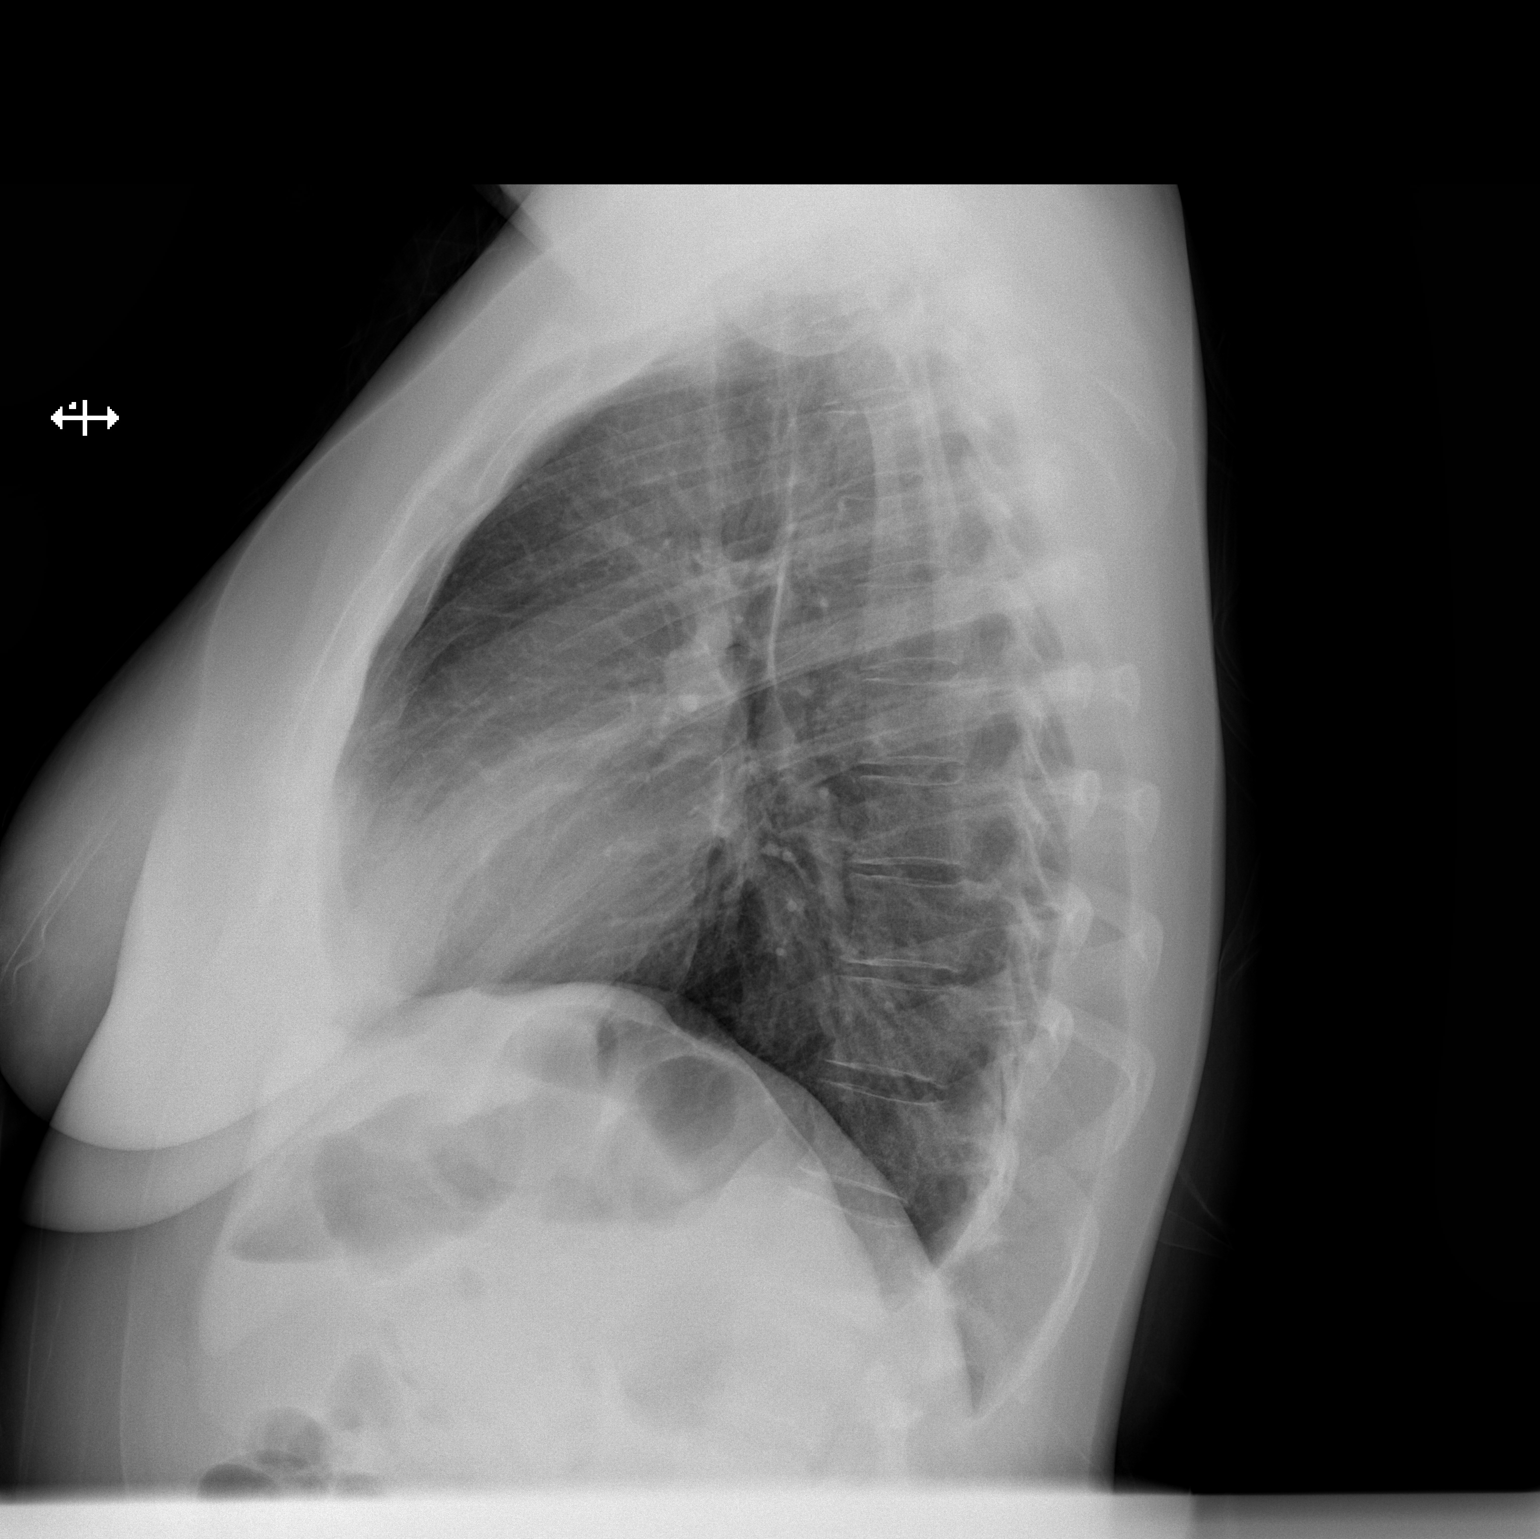

[2 of 2 positions shown; findings below may reference images not displayed]

FINDINGS: Cardiac silhouette and mediastinal contours are within normal
limits. The lungs are clear. No pleural effusion or pneumothorax. No
acute skeletal abnormality.
IMPRESSION: No active cardiopulmonary disease.

## 2022-05-02 ENCOUNTER — Other Ambulatory Visit: Payer: Self-pay

## 2022-05-02 ENCOUNTER — Emergency Department (HOSPITAL_COMMUNITY)
Admission: EM | Admit: 2022-05-02 | Discharge: 2022-05-02 | Disposition: A | Discharge status: Home / Self Care | Payer: Medicaid Other | Attending: Emergency Medicine | Admitting: Emergency Medicine

## 2022-05-02 ENCOUNTER — Emergency Department (HOSPITAL_COMMUNITY): Payer: Medicaid Other

## 2022-05-02 ENCOUNTER — Encounter (HOSPITAL_COMMUNITY): Payer: Self-pay

## 2022-05-02 DIAGNOSIS — F411 Generalized anxiety disorder: Secondary | ICD-10-CM | POA: Diagnosis not present

## 2022-05-02 DIAGNOSIS — F331 Major depressive disorder, recurrent, moderate: Secondary | ICD-10-CM | POA: Insufficient documentation

## 2022-05-02 DIAGNOSIS — F410 Panic disorder [episodic paroxysmal anxiety] without agoraphobia: Secondary | ICD-10-CM | POA: Insufficient documentation

## 2022-05-02 DIAGNOSIS — F419 Anxiety disorder, unspecified: Secondary | ICD-10-CM | POA: Insufficient documentation

## 2022-05-02 DIAGNOSIS — R197 Diarrhea, unspecified: Secondary | ICD-10-CM | POA: Diagnosis not present

## 2022-05-02 LAB — ACETAMINOPHEN LEVEL: Acetaminophen (Tylenol), Serum: <10 ug/mL — ABNORMAL LOW (ref 10–30)

## 2022-05-02 LAB — COMPREHENSIVE METABOLIC PANEL
ALT: 36 U/L (ref 0–44)
AST: 33 U/L (ref 15–41)
Albumin: 4.3 g/dL (ref 3.5–5.0)
Alkaline Phosphatase: 60 U/L (ref 38–126)
Anion gap: 11 (ref 5–15)
BUN: 5 mg/dL — ABNORMAL LOW (ref 6–20)
CO2: 22 mmol/L (ref 22–32)
Calcium: 9.8 mg/dL (ref 8.9–10.3)
Chloride: 103 mmol/L (ref 98–111)
Creatinine, Ser: 0.83 mg/dL (ref 0.44–1.00)
GFR, Estimated: >60 mL/min (ref >60)
Glucose, Bld: 98 mg/dL (ref 70–99)
Potassium: 3.9 mmol/L (ref 3.5–5.1)
Sodium: 136 mmol/L (ref 135–145)
Total Bilirubin: 0.2 mg/dL — ABNORMAL LOW (ref 0.3–1.2)
Total Protein: 7.6 g/dL (ref 6.5–8.1)

## 2022-05-02 LAB — CBC WITH DIFFERENTIAL/PLATELET
Abs Immature Granulocytes: 0.03 10*3/uL (ref 0.00–0.07)
Basophils Absolute: 0 10*3/uL (ref 0.0–0.1)
Basophils Relative: 0 %
Eosinophils Absolute: 0 10*3/uL (ref 0.0–0.5)
Eosinophils Relative: 0 %
HCT: 44.7 % (ref 36.0–46.0)
Hemoglobin: 14.6 g/dL (ref 12.0–15.0)
Immature Granulocytes: 0 %
Lymphocytes Relative: 23 %
Lymphs Abs: 1.7 10*3/uL (ref 0.7–4.0)
MCH: 28.9 pg (ref 26.0–34.0)
MCHC: 32.7 g/dL (ref 30.0–36.0)
MCV: 88.5 fL (ref 80.0–100.0)
Monocytes Absolute: 0.7 10*3/uL (ref 0.1–1.0)
Monocytes Relative: 9 %
Neutro Abs: 4.9 10*3/uL (ref 1.7–7.7)
Neutrophils Relative %: 68 %
Platelets: 375 10*3/uL (ref 150–400)
RBC: 5.05 MIL/uL (ref 3.87–5.11)
RDW: 12.9 % (ref 11.5–15.5)
WBC: 7.4 10*3/uL (ref 4.0–10.5)
nRBC: 0 % (ref 0.0–0.2)

## 2022-05-02 LAB — I-STAT BETA HCG BLOOD, ED (MC, WL, AP ONLY): I-stat hCG, quantitative: <5 m[IU]/mL (ref <5)

## 2022-05-02 LAB — ETHANOL: Alcohol, Ethyl (B): <10 mg/dL (ref <10)

## 2022-05-02 LAB — TSH: TSH: 1.22 u[IU]/mL (ref 0.350–4.500)

## 2022-05-02 LAB — T4, FREE: Free T4: 1.16 ng/dL — ABNORMAL HIGH (ref 0.61–1.12)

## 2022-05-02 LAB — SALICYLATE LEVEL: Salicylate Lvl: <7 mg/dL — ABNORMAL LOW (ref 7.0–30.0)

## 2022-05-02 MED ORDER — ACETAMINOPHEN 325 MG PO TABS: 650.0000 mg | ORAL_TABLET | ORAL | Status: DC | PRN

## 2022-05-02 MED ORDER — ALPRAZOLAM 0.25 MG PO TABS: 0.5000 mg | ORAL_TABLET | Freq: Three times a day (TID) | ORAL | Status: DC | PRN

## 2022-05-02 MED ORDER — PAROXETINE HCL 20 MG PO TABS
20.0000 mg | ORAL_TABLET | Freq: Every day | ORAL | Status: DC
  Filled 2022-05-02: qty 1

## 2022-05-02 MED ORDER — LACTATED RINGERS IV BOLUS: 1000.0000 mL | Freq: Once | INTRAVENOUS | Status: DC

## 2022-05-02 NOTE — Progress Notes (Signed)
   05/02/22 1500  Spiritual Encounters  Type of Visit Initial  Care provided to: Patient  Referral source Patient request  Reason for visit Urgent spiritual support  OnCall Visit Yes  Spiritual Framework  Presenting Themes Coping tools  Community/Connection Family  Patient Stress Factors Loss of control  Interventions  Spiritual Care Interventions Made Prayer;Compassionate presence;Reflective listening;Normalization of emotions   CH responded to request for urgent emotional support. Pt said that she is dealing with high anxiety and she is having a hard time to control it. She has recently loss her dog, her cat is sick and she fears that she might lose her cat as well. She is going to get marry in June and the planning is a source of stress. She is having a hard time to trust her doctors because she fears they might miss something  that is important for her health. Ch lower pt's anxiety by reflective listening and normalizing feelings. Pt requested prayer. Ch offered prayer. No follow-up needed at this time.

## 2022-05-02 NOTE — Discharge Instructions (Signed)

## 2022-05-02 NOTE — ED Notes (Signed)
Pts belongings are in locker #5

## 2022-05-02 NOTE — Consult Note (Signed)
[facility] ED ASSESSMENT   Reason for Consult:  Eval Referring Physician:  Dr. Regenia Skeeter Patient Identification: Samantha Nguyen MRN:  [id number] ED Chief Complaint: Generalized anxiety disorder  Diagnosis:  Principal Problem:   Generalized anxiety disorder Active Problems:   MDD (major depressive disorder), recurrent episode, moderate (HCC)   ED Assessment Time Calculation: Start Time: 1800 Stop Time: 1900 Total Time in Minutes (Assessment Completion): 60  HPI:   Samantha Nguyen is a 37 y.o. female patient who "presents with heart racing, a sense of feeling like she is going to die, and a feeling of wanting to harm herself.  Since the beginning of January she has been having on and off feelings of heart racing and feeling like she is going to die.  She has been seen in different ERs a couple times but feels like the correct diagnosis has not been made.  She tells me about how she went and did to sepsis from an infected ureteral stone 6 years ago and this has her concerned about missed diagnoses.  She follows with psychiatry and most recently this morning around 3 AM woke up with her heart racing and a feeling of doom.  She took a 0.5 mg Xanax and went back to bed.  At 5 AM she woke up and could not go back to sleep but her symptoms were much better.  For the past several days she has been thinking of harming herself including with fire or cutting.  She states that she wants to not feel "numb".  Sometimes she will pinch herself.  She has been told she has sinus tachycardia but is concerned there is something else.  She does feel short of breath but denies any chest pain.  She is not eating well because of no appetite.  She denies any vomiting but has been having some diarrhea."  Subjective:   Patient seen at [facility] for psychiatric evaluation. Patient is calm, cooperative, and pleasant. Pt does tell me about her history with misdiagnosis, and how her health is a trigger for her anxiety. She tells me her  tachycardia from earlier in the day "sent her in a spiral" and she had to come to the ED to be checked out or she was never going to stop wondering/calm down. Pt stated since original ED presentation around 1200, she has had tests and blood work, and ED physician went over her results that she looks medically healthy. Pt stated "I feel so much better. I feel a lot more calm and I am wanting to leave the hospital." Pt is now denying any suicidal ideations. Has no desire to self harm. Pt stated the last time she engaged in self injurious behaviors was a few years ago. She reports occasional intrusive thoughts of cutting herself when under stress but utilizes her coping skills. She denies homicidal ideations. Denies auditory or visual hallucinations.   Pt is currently in the [facility] program through [facility], and started on 04/29/22. She states its going "okay" and mentions she wishes it was in person vs. Virtual. She stated "I am going to give it a try. I know I probably need to work on my coping skills." We had lengthy discussion about the physical effects anxiety can have on the body, and how this triggers her trauma with health misdiagnosis which increases her anxiety and causes her frequent presentations to the ED for evaluations. Encouraged patient to continue working on coping skills in [facility] and therapy. Pt is able to contract for safety at  this time. Pt is requesting discharge, it is her fiance's birthday and she is hoping to celebrate him tonight now that she knows there is nothing wrong with her health. Will psychiatrically clear patient.   Pt agreed for me to call husband, however he did not answer x2. [phone]. Pt stated he does not answer phone numbers he does not know, and there is no voicemail set up. Will let RN know to attempt to contact husband to coordinate transportation.   Past Psychiatric History:  MDD, GAD, suspected borderline personality disorder  Risk to Self or Others: Is the patient at  risk to self? No Has the patient been a risk to self in the past 6 months? No Has the patient been a risk to self within the distant past? Yes Is the patient a risk to others? No Has the patient been a risk to others in the past 6 months? No Has the patient been a risk to others within the distant past? No  [place] Scale:  [place] ED from 05/02/2022 in [facility] at [facility] Counselor from 04/30/2022 in [facility] Counselor from 04/28/2022 in [facility] CATEGORY Moderate Risk Error: Question 6 not populated Error: Question 2 not populated       Past Medical History:  Past Medical History:  Diagnosis Date   Anxiety    Depressed    History of kidney stones    Migraine    Renal disorder     Past Surgical History:  Procedure Laterality Date   CESAREAN SECTION     CYSTOSCOPY W/ URETERAL STENT PLACEMENT Right 08/24/2016   Procedure: CYSTOSCOPY WITH RETROGRADE PYELOGRAM/URETERAL STENT PLACEMENT;  Surgeon: Cleon Gustin, MD;  Location: [facility];  Service: Urology;  Laterality: Right;   CYSTOSCOPY WITH RETROGRADE PYELOGRAM, URETEROSCOPY AND STENT PLACEMENT Right 09/09/2016   Procedure: CYSTOSCOPY WITH RETROGRADE PYELOGRAM, URETEROSCOPY AND STENT REPLACEMENT;  Surgeon: Cleon Gustin, MD;  Location: [facility];  Service: Urology;  Laterality: Right;   MANDIBLE SURGERY     Family History:  Family History  Problem Relation Age of Onset   Hypertension Mother    Ulcerative colitis Mother    Alcohol abuse Father    Drug abuse Father    Anxiety disorder Father    Drug abuse Brother    Anxiety disorder Maternal Grandfather    Social History:  Social History   Substance and Sexual Activity  Alcohol Use Yes   Comment: occasionally     Social History   Substance and Sexual Activity  Drug Use No    Social History   Socioeconomic History   Marital  status: Single    Spouse name: Not on file   Number of children: 1   Years of education: Not on file   Highest education level: Some college, no degree  Occupational History   Not on file  Tobacco Use   Smoking status: Former   Smokeless tobacco: Never  Scientific laboratory technician Use: Some days   Substances: CBD  Substance and Sexual Activity   Alcohol use: Yes    Comment: occasionally   Drug use: No   Sexual activity: Not on file  Other Topics Concern   Not on file  Social History Narrative   Not on file   Social Determinants of Health   Financial Resource Strain: Not on file  Food Insecurity: Not on file  Transportation Needs: Not  on file  Physical Activity: Not on file  Stress: Not on file  Social Connections: Not on file     Allergies:   Allergies  Allergen Reactions   Sulfa Antibiotics Other (See Comments)    Unknown, happened when she was a child    Labs:  Results for orders placed or performed during the hospital encounter of 05/02/22 (from the past 48 hour(s))  Comprehensive metabolic panel     Status: Abnormal   Collection Time: 05/02/22 12:47 PM  Result Value Ref Range   Sodium 136 135 - 145 mmol/L   Potassium 3.9 3.5 - 5.1 mmol/L   Chloride 103 98 - 111 mmol/L   CO2 22 22 - 32 mmol/L   Glucose, Bld 98 70 - 99 mg/dL    Comment: Glucose reference range applies only to samples taken after fasting for at least 8 hours.   BUN 5 (L) 6 - 20 mg/dL   Creatinine, Ser 0.83 0.44 - 1.00 mg/dL   Calcium 9.8 8.9 - 10.3 mg/dL   Total Protein 7.6 6.5 - 8.1 g/dL   Albumin 4.3 3.5 - 5.0 g/dL   AST 33 15 - 41 U/L   ALT 36 0 - 44 U/L   Alkaline Phosphatase 60 38 - 126 U/L   Total Bilirubin 0.2 (L) 0.3 - 1.2 mg/dL   GFR, Estimated >60 >60 mL/min    Comment: (NOTE) Calculated using the CKD-EPI Creatinine Equation (2021)    Anion gap 11 5 - 15    Comment: Performed at [facility] [address] [place], [place] [address]  Acetaminophen level     Status:  Abnormal   Collection Time: 05/02/22 12:47 PM  Result Value Ref Range   Acetaminophen (Tylenol), Serum <10 (L) 10 - 30 ug/mL    Comment: (NOTE) Therapeutic concentrations vary significantly. A range of 10-30 ug/mL  may be an effective concentration for many patients. However, some  are best treated at concentrations outside of this range. Acetaminophen concentrations >150 ug/mL at 4 hours after ingestion  and >50 ug/mL at 12 hours after ingestion are often associated with  toxic reactions.  Performed at [facility], [place] [address] [place], [place] [address]   Ethanol     Status: None   Collection Time: 05/02/22 12:47 PM  Result Value Ref Range   Alcohol, Ethyl (B) <10 <10 mg/dL    Comment: (NOTE) Lowest detectable limit for serum alcohol is 10 mg/dL.  For medical purposes only. Performed at [facility], [place] [address], [place], [place] [id number]   Salicylate level     Status: Abnormal   Collection Time: 05/02/22 12:47 PM  Result Value Ref Range   Salicylate Lvl Q000111Q (L) 7.0 - 30.0 mg/dL    Comment: Performed at [facility] [address] [place], [place] [address]  CBC with Differential     Status: None   Collection Time: 05/02/22 12:47 PM  Result Value Ref Range   WBC 7.4 4.0 - 10.5 K/uL   RBC 5.05 3.87 - 5.11 MIL/uL   Hemoglobin 14.6 12.0 - 15.0 g/dL   HCT 44.7 36.0 - 46.0 %   MCV 88.5 80.0 - 100.0 fL   MCH 28.9 26.0 - 34.0 pg   MCHC 32.7 30.0 - 36.0 g/dL   RDW 12.9 11.5 - 15.5 %   Platelets 375 150 - 400 K/uL   nRBC 0.0 0.0 - 0.2 %   Neutrophils Relative % 68 %  Neutro Abs 4.9 1.7 - 7.7 K/uL   Lymphocytes Relative 23 %   Lymphs Abs 1.7 0.7 - 4.0 K/uL   Monocytes Relative 9 %   Monocytes Absolute 0.7 0.1 - 1.0 K/uL   Eosinophils Relative 0 %   Eosinophils Absolute 0.0 0.0 - 0.5 K/uL   Basophils Relative 0 %   Basophils Absolute 0.0 0.0 - 0.1 K/uL   Immature Granulocytes 0 %   Abs Immature Granulocytes 0.03 0.00 - 0.07 K/uL    Comment:  Performed at [facility] [address] [place], [place] [address]  T4, free     Status: Abnormal   Collection Time: 05/02/22 12:47 PM  Result Value Ref Range   Free T4 1.16 (H) 0.61 - 1.12 ng/dL    Comment: (NOTE) Biotin ingestion may interfere with free T4 tests. If the results are inconsistent with the TSH level, previous test results, or the clinical presentation, then consider biotin interference. If needed, order repeat testing after stopping biotin. Performed at [facility], [place] [address] [place], [place] [address]   TSH     Status: None   Collection Time: 05/02/22 12:48 PM  Result Value Ref Range   TSH 1.220 0.350 - 4.500 uIU/mL    Comment: Performed by a 3rd Generation assay with a functional sensitivity of <=0.01 uIU/mL. Performed at [facility], [place] [address], [place],  [address]   I-Stat beta hCG blood, ED     Status: None   Collection Time: 05/02/22  1:21 PM  Result Value Ref Range   I-stat hCG, quantitative <5.0 <5 mIU/mL   Comment 3            Comment:   GEST. AGE      CONC.  (mIU/mL)   <=1 WEEK        5 - 50     2 WEEKS       50 - 500     3 WEEKS       100 - 10,000     4 WEEKS     1,000 - 30,000        FEMALE AND NON-PREGNANT FEMALE:     LESS THAN 5 mIU/mL     Current Facility-Administered Medications  Medication Dose Route Frequency Provider Last Rate Last Admin   acetaminophen (TYLENOL) tablet 650 mg  650 mg Oral Q4H PRN Sherwood Gambler, MD       ALPRAZolam Duanne Moron) tablet 0.5 mg  0.5 mg Oral TID PRN Sherwood Gambler, MD       lactated ringers bolus 1,000 mL  1,000 mL Intravenous Once Sherwood Gambler, MD       PARoxetine (PAXIL) tablet 20 mg  20 mg Oral Daily Sherwood Gambler, MD       Current Outpatient Medications  Medication Sig Dispense Refill   ALPRAZolam (XANAX) 0.5 MG tablet Take 1 tablet (0.5 mg total) by mouth 3 (three) times daily as needed for anxiety. 30 tablet 0   diclofenac (VOLTAREN) 75 MG EC tablet Take 1  tablet (75 mg total) by mouth 2 (two) times daily as needed. Do not take until finished with steroid taper (Patient not taking: Reported on 04/02/2022) 60 tablet 0   fexofenadine (ALLEGRA) 180 MG tablet Take 180 mg by mouth daily as needed for allergies.     fluticasone (FLONASE) 50 MCG/ACT nasal spray Place 1 spray into both nostrils daily. (Patient taking differently: Place 1 spray into both nostrils as needed for  rhinitis or allergies.) 16 g 2   HYDROcodone-acetaminophen (NORCO) 5-325 MG tablet Take 1 tablet by mouth every 4 (four) hours as needed for moderate pain or severe pain. (Patient not taking: Reported on 04/02/2022) 20 tablet 0   LORazepam (ATIVAN) 0.5 MG tablet Take 0.5 mg by mouth as needed for anxiety.     Multiple Vitamins-Minerals (MULTIVITAMIN ADULT) TABS Take 1 tablet by mouth daily. (Patient not taking: Reported on 04/30/2022)     PARoxetine (PAXIL) 30 MG tablet Take 30 mg by mouth daily.     predniSONE (STERAPRED UNI-PAK 21 TAB) 10 MG (21) TBPK tablet Take as directed (Patient not taking: Reported on 04/02/2022) 21 tablet 0   propranolol (INDERAL) 10 MG tablet Take 10 mg by mouth daily.     tamsulosin (FLOMAX) 0.4 MG CAPS capsule 1 q HS to aid stone passage (Patient not taking: Reported on 04/02/2022) 7 capsule 0    Psychiatric Specialty Exam: Presentation  General Appearance:  Appropriate for Environment  Eye Contact: Good  Speech: Clear and Coherent  Speech Volume: Normal  Handedness: Right   Mood and Affect  Mood: Anxious  Affect: Congruent   Thought Process  Thought Processes: Coherent  Descriptions of Associations:Intact  Orientation:Full (Time, Place and Person)  Thought Content:Logical  History of Schizophrenia/Schizoaffective disorder:No  Duration of Psychotic Symptoms:No data recorded Hallucinations:Hallucinations: None  Ideas of Reference:None  Suicidal Thoughts:Suicidal Thoughts: No  Homicidal Thoughts:Homicidal Thoughts:  No   Sensorium  Memory: Immediate Fair; Recent Fair  Judgment: Fair  Insight: Fair   Community education officer  Concentration: Good  Attention Span: Good  Recall: Good  Fund of Knowledge: Good  Language: Good   Psychomotor Activity  Psychomotor Activity: Psychomotor Activity: Normal   Assets  Assets: Communication Skills; Leisure Time; Physical Health; Resilience; Desire for Improvement    Sleep  Sleep: Sleep: Good   Physical Exam: Physical Exam Neurological:     Mental Status: She is alert and oriented to person, place, and time.  Psychiatric:        Attention and Perception: Attention normal.        Mood and Affect: Mood is anxious.        Speech: Speech normal.        Behavior: Behavior is cooperative.        Thought Content: Thought content normal.    Review of Systems  Psychiatric/Behavioral:  Positive for depression. The patient is nervous/anxious.   All other systems reviewed and are negative.  Blood pressure 138/88, pulse (!) 118, temperature 98.4 F (36.9 C), temperature source Oral, resp. rate 20, height 5' 7"$  (1.702 m), weight 73 kg, last menstrual period 05/02/2022, SpO2 100 %. Body mass index is 25.22 kg/m.  Medical Decision Making: Pt case reviewed and discussed with Dr. Dwyane Dee. Pt is able to contract for safety at this time, denying SI, SIB, HI, and AVH after being assured she is physically healthy/no medical concerns. Pt is requesting discharge, and she does not meet criteria for Inpatient psychiatric treatment or IVC. Additional resources provided in AVS.   - Continue partial hospitalization program  - continue outpatient medications  Disposition: No evidence of imminent risk to self or others at present.   Patient does not meet criteria for psychiatric inpatient admission. Supportive therapy provided about ongoing stressors. Discussed crisis plan, support from social network, calling 911, coming to the Emergency Department, and  calling Suicide Hotline.  Vesta Mixer, NP 05/02/2022 5:59 PM

## 2022-05-02 NOTE — ED Notes (Signed)
Discharge instructions reviewed with patient. RN answered all questions.

## 2022-05-02 NOTE — ED Triage Notes (Signed)
Pt BIB EMS due to anxiety and SI without a plan. Pt feels like she is going to die due to elevated HR. Axox4. VSS.

## 2022-05-02 NOTE — ED Provider Notes (Signed)
[facility] Provider Note   CSN: [id number] Arrival date & time: 05/02/22  1206     History  Chief Complaint  Patient presents with   Anxiety   Suicidal    Samantha Nguyen is a 37 y.o. female.  HPI 37 year old female presents with heart racing, a sense of feeling like she is going to die, and a feeling of wanting to harm herself.  Since the beginning of January she has been having on and off feelings of heart racing and feeling like she is going to die.  She has been seen in different ERs a couple times but feels like the correct diagnosis has not been made.  She tells me about how she went and did to sepsis from an infected ureteral stone 6 years ago and this has her concerned about missed diagnoses.  She follows with psychiatry and most recently this morning around 3 AM woke up with her heart racing and a feeling of doom.  She took a 0.5 mg Xanax and went back to bed.  At 5 AM she woke up and could not go back to sleep but her symptoms were much better.  For the past several days she has been thinking of harming herself including with fire or cutting.  She states that she wants to not feel "numb".  Sometimes she will pinch herself.  She has been told she has sinus tachycardia but is concerned there is something else.  She does feel short of breath but denies any chest pain.  She is not eating well because of no appetite.  She denies any vomiting but has been having some diarrhea.  Home Medications Prior to Admission medications   Medication Sig Start Date End Date Taking? Authorizing Provider  ALPRAZolam Duanne Moron) 0.5 MG tablet Take 1 tablet (0.5 mg total) by mouth 3 (three) times daily as needed for anxiety. 05/31/21   Milton Ferguson, MD  diclofenac (VOLTAREN) 75 MG EC tablet Take 1 tablet (75 mg total) by mouth 2 (two) times daily as needed. Do not take until finished with steroid taper Patient not taking: Reported on 04/02/2022 10/22/20   Aundra Dubin, PA-C  fexofenadine (ALLEGRA) 180 MG tablet Take 180 mg by mouth daily as needed for allergies. 12/07/21   [provider]  fluticasone (FLONASE) 50 MCG/ACT nasal spray Place 1 spray into both nostrils daily. Patient taking differently: Place 1 spray into both nostrils as needed for rhinitis or allergies. 05/08/17   Zigmund Gottron, NP  HYDROcodone-acetaminophen (NORCO) 5-325 MG tablet Take 1 tablet by mouth every 4 (four) hours as needed for moderate pain or severe pain. Patient not taking: Reported on 04/02/2022 07/31/21   Daleen Bo, MD  LORazepam (ATIVAN) 0.5 MG tablet Take 0.5 mg by mouth as needed for anxiety.    [provider]  Multiple Vitamins-Minerals (MULTIVITAMIN ADULT) TABS Take 1 tablet by mouth daily. Patient not taking: Reported on 04/30/2022    [provider]  PARoxetine (PAXIL) 30 MG tablet Take 30 mg by mouth daily.    [provider]  predniSONE (STERAPRED UNI-PAK 21 TAB) 10 MG (21) TBPK tablet Take as directed Patient not taking: Reported on 04/02/2022 10/22/20   Aundra Dubin, PA-C  propranolol (INDERAL) 10 MG tablet Take 10 mg by mouth daily.    [provider]  tamsulosin (FLOMAX) 0.4 MG CAPS capsule 1 q HS to aid stone passage Patient not taking: Reported on 04/02/2022  07/31/21   Daleen Bo, MD      Allergies    Sulfa antibiotics    Review of Systems   Review of Systems  Constitutional:  Negative for fever.  Respiratory:  Positive for shortness of breath.   Cardiovascular:  Positive for palpitations. Negative for chest pain.  Gastrointestinal:  Positive for diarrhea. Negative for abdominal pain and vomiting.  Psychiatric/Behavioral:  Positive for dysphoric mood and suicidal ideas. The patient is nervous/anxious.     Physical Exam Updated Vital Signs BP 138/88 (BP Location: Right Arm)   Pulse (!) 118   Temp 98.4 F (36.9 C) (Oral)   Resp 20   Ht 5' 7"$  (1.702 m)   Wt 73 kg   LMP 05/02/2022  (Approximate)   SpO2 100%   BMI 25.22 kg/m  Physical Exam Vitals and nursing note reviewed.  Constitutional:      Appearance: She is well-developed.  HENT:     Head: Normocephalic and atraumatic.  Cardiovascular:     Rate and Rhythm: Regular rhythm. Tachycardia present.     Heart sounds: Normal heart sounds.  Pulmonary:     Effort: Pulmonary effort is normal.     Breath sounds: Normal breath sounds.  Abdominal:     General: There is no distension.     Palpations: Abdomen is soft.     Tenderness: There is no abdominal tenderness.  Skin:    General: Skin is warm and dry.  Neurological:     Mental Status: She is alert.  Psychiatric:        Mood and Affect: Mood is anxious.     Comments: She is significantly anxious with bilateral hand shaking.  She denies suicidal thoughts but states she does feel self-harm thoughts.     ED Results / Procedures / Treatments   Labs (all labs ordered are listed, but only abnormal results are displayed) Labs Reviewed  COMPREHENSIVE METABOLIC PANEL - Abnormal; Notable for the following components:      Result Value   BUN 5 (*)    Total Bilirubin 0.2 (*)    All other components within normal limits  ACETAMINOPHEN LEVEL - Abnormal; Notable for the following components:   Acetaminophen (Tylenol), Serum <10 (*)    All other components within normal limits  SALICYLATE LEVEL - Abnormal; Notable for the following components:   Salicylate Lvl Q000111Q (*)    All other components within normal limits  T4, FREE - Abnormal; Notable for the following components:   Free T4 1.16 (*)    All other components within normal limits  ETHANOL  CBC WITH DIFFERENTIAL/PLATELET  TSH  RAPID URINE DRUG SCREEN, HOSP PERFORMED  I-STAT BETA HCG BLOOD, ED ([facility], [facility], [facility] ONLY)    EKG ED ECG REPORT   Date: 05/02/2022  Rate: 98  Rhythm: normal sinus rhythm  QRS Axis: normal  Intervals: normal  ST/T Wave abnormalities: normal  Conduction Disutrbances:none  Narrative  Interpretation:   Old EKG Reviewed: unchanged  I have personally reviewed the EKG tracing and agree with the computerized printout as noted.   Radiology DG Chest 2 View  Result Date: 05/02/2022 CLINICAL DATA:  Dyspnea.  Tachycardia. EXAM: CHEST - 2 VIEW COMPARISON:  04/21/2022. FINDINGS: Normal heart, mediastinum and hila. Clear lungs.  No pleural effusion or pneumothorax. Skeletal structures are unremarkable. IMPRESSION: No active cardiopulmonary disease. Electronically Signed   By: Lajean Manes M.D.   On: 05/02/2022 13:08    Procedures Procedures    Medications Ordered in ED  Medications  lactated ringers bolus 1,000 mL (1,000 mLs Intravenous Patient Refused/Not Given 05/02/22 1323)    ED Course/ Medical Decision Making/ A&P                             Medical Decision Making Amount and/or Complexity of Data Reviewed Labs: ordered.    Details: Normal hemoglobin, no significant electrolyte disturbance.  Free T4 is slightly above normal but not consistent with thyrotoxicosis.  TSH is normal. Radiology: ordered.    Details: Normal heart size, no CHF ECG/medicine tests: independent interpretation performed.    Details: Sinus rhythm, HR 98, no ischemia   Patient seems to have a primary issue with anxiety/desire for self-harm.  I think her to echocardiac related to the anxiety.  She is currently declining any type of medicines.  For now, I think she is medically stable for psychiatric disposition.  Her heart rate was elevated on arrival but by the time we are getting the EKG she is down to 98.  I do not see any medical reason for her tachycardia.  She has no chest pain.  I highly doubt ACS, PE, etc.  At this point will consult TTS.        Final Clinical Impression(s) / ED Diagnoses Final diagnoses:  Anxiety    Rx / DC Orders ED Discharge Orders     None         Sherwood Gambler, MD 05/02/22 1524

## 2022-05-02 NOTE — ED Notes (Signed)
Belongings given back to patient since she was psych cleared.

## 2022-05-02 NOTE — ED Notes (Signed)
Pt provided with phone to get phone numbers from contact list.   Pt's phone returned to locker #5 in belongings bag.

## 2022-05-03 ENCOUNTER — Other Ambulatory Visit: Payer: Self-pay

## 2022-05-03 ENCOUNTER — Ambulatory Visit (HOSPITAL_COMMUNITY): Payer: Medicaid Other

## 2022-05-03 ENCOUNTER — Ambulatory Visit (INDEPENDENT_AMBULATORY_CARE_PROVIDER_SITE_OTHER): Payer: Medicaid Other | Admitting: Licensed Clinical Social Worker

## 2022-05-03 DIAGNOSIS — F411 Generalized anxiety disorder: Secondary | ICD-10-CM

## 2022-05-03 DIAGNOSIS — F4310 Post-traumatic stress disorder, unspecified: Secondary | ICD-10-CM

## 2022-05-03 DIAGNOSIS — F331 Major depressive disorder, recurrent, moderate: Secondary | ICD-10-CM

## 2022-05-03 DIAGNOSIS — F4521 Hypochondriasis: Secondary | ICD-10-CM | POA: Diagnosis not present

## 2022-05-03 NOTE — Psych (Signed)
Virtual Visit via Video Note  I connected with [name] on 04/30/22 at  9:00 AM EST by a video enabled telemedicine application and verified that I am speaking with the correct person using two identifiers.  Location: Patient: patient home Provider: clinical home office   I discussed the limitations of evaluation and management by telemedicine and the availability of in person appointments. The patient expressed understanding and agreed to proceed.  I discussed the assessment and treatment plan with the patient. The patient was provided an opportunity to ask questions and all were answered. The patient agreed with the plan and demonstrated an understanding of the instructions.   The patient was advised to call back or seek an in-person evaluation if the symptoms worsen or if the condition fails to improve as anticipated.  Pt was provided 240 minutes of non-face-to-face time during this encounter.   Lorin Glass, LCSW   [facility] PHP THERAPIST PROGRESS NOTE  Samantha Nguyen [id number]  Session Time: 9:00 -10:00  Participation Level: Active  Behavioral Response: CasualAlertAnxious  Type of Therapy: Group Therapy  Treatment Goals addressed: Coping  Progress Towards Goals: Initial  Interventions: CBT, DBT, Supportive, and Reframing  Summary: Samantha Nguyen is a 37 y.o. female who presents with anxiety.  Clinician led check-in regarding current stressors and situation, and review of patient completed daily inventory. Clinician utilized active listening and empathetic response and validated patient emotions. Clinician facilitated processing group on pertinent issues.?    Therapist Response: Patient arrived within time allowed. Patient rates her mood at a 3.5 on a scale of 1-10 with 10 being best. Pt states she feels "okay." Pt states she slept "hardly any" hours and ate 1 snack. Pt reports she took er first dose of propanalol last night and woke up "awful" and felt she was dying or  having a stroke. Pt states taking a bath to attempt to help calm down and it didn't work. Pt struggles with feeling dominated thoughts and is resistant to challenging. Pt states high hopelessness and denies SI. Pt is tearful throughout session. Patient able to process. Patient engaged in discussion.             Session Time: 10:00 am - 11:00 am   Participation Level: Active   Behavioral Response: CasualAlertDepressed   Type of Therapy: Group Therapy   Treatment Goals addressed: Coping   Progress Towards Goals: Progressing   Interventions: CBT, DBT, Solution Focused, Strength-based, Supportive, and Reframing   Therapist Response: Cln led discussion on guilt and the way it impacts [name]. Cln utilized CBT cognitive distortion: emotional reasoning to inform discussion. Cln encouraged pt's to consider whether the guilt was founded as a first step to address the feeling. Group members discussed feelings of guilt and worked to determine whether those feelings were founded in truth or feeling.    Therapist Response: Pt engaged in discussion and is able to offer a guilt example and work through it with the group.            Session Time: 11:00 -12:00   Participation Level: Active   Behavioral Response: CasualAlertDepressed   Type of Therapy: Group Therapy   Treatment Goals addressed: Coping   Progress Towards Goals: Progressing   Interventions: CBT, DBT, Solution Focused, Strength-based, Supportive, and Reframing   Summary: Cln led discussion on ways to manage stressors and feelings over the weekend. Group members  brainstormed things to do over the weekend for multiple levels of energy, access, and moods. Cln reviewed crisis services  should they be needed and provided pt's with the text crisis line, mobile crisis, national suicide hotline, [facility] 24/7 line, and information on [facility].     Therapist Response: Pt engaged in discussion and is able to identify 3 ideas of what to do  over the weekend to keep their mind engaged.            Session Time: 12:00 -1:00   Participation Level: Active   Behavioral Response: CasualAlertDepressed   Type of Therapy: Group therapy, Occupational Therapy   Treatment Goals addressed: Coping   Progress Towards Goals: Progressing   Interventions: Supportive; Psychoeducation   Summary: 12:00 - 12:50: Occupational Therapy group led by cln E. Hollan. 12:50 - 1:00 Clinician assessed for immediate needs, medication compliance and efficacy, and safety concerns.   Therapist Response: 12:00 - 12:50: Pt participated 12:50 - 1:00 pm: At check-out, patient reports no immediate concerns. Patient demonstrates progress as evidenced by continued engagement and responsiveness to treatment. Patient denies SI/HI/self-harm thoughts at the end of group.    Suicidal/Homicidal: Nowithout intent/plan   Plan: Pt will continue in PHP while working to decrease anxiety symptoms, increase emotion regulation, and increase ability to manage symptoms in a healthy manner.   Collaboration of Care: Medication Management AEB A Pashayan  Patient/Guardian was advised Release of Information must be obtained prior to any record release in order to collaborate their care with an outside provider. Patient/Guardian was advised if they have not already done so to contact the registration department to sign all necessary forms in order for [name] to release information regarding their care.   Consent: Patient/Guardian gives verbal consent for treatment and assignment of benefits for services provided during this visit. Patient/Guardian expressed understanding and agreed to proceed.   Diagnosis: GAD (generalized anxiety disorder) [F41.1]    1. GAD (generalized anxiety disorder)   2. MDD (major depressive disorder), recurrent episode, moderate ([place])       Lorin Glass, LCSW 05/03/2022

## 2022-05-03 NOTE — Progress Notes (Signed)
BH MD/PA/NP PHP Progress Note  Virtual Visit via Video Note  I connected with Samantha Nguyen on 05/04/22 at  9:00 AM EST by a video enabled telemedicine application and verified that I am speaking with the correct person using two identifiers.  Location: Patient: Home Provider: [facility]   I discussed the limitations of evaluation and management by telemedicine and the availability of in person appointments. The patient expressed understanding and agreed to proceed.  05/04/2022 7:02 AM Samantha Nguyen  MRN:  [id number]  Chief Complaint:  Chief Complaint  Patient presents with   Follow-up   Anxiety   Depression   HPI:  Samantha Nguyen is a 37 yr old female who presents via Virtual Video Visit for Follow Up and  Medication Management, she enrolled in the [facility] Program on 04/29/2022.  PPHx is significant for Illness Anxiety Disorder, GAD, MDD, and possible Borderline Personality Disorder, and a remote history of Self Injurious Behavior (Cutting, Burning- last ~2017), and no history of Suicide Attempts or Psychiatric Hospitalizations.    She reports that she has continued to have issues with her anxiety.  She reports that on Saturday she was neutral but yesterday had a panic attack and went to the ED.  She reports that the physician there did run tests and feels somewhat better since she was told of the lab results did not reveal any issues.  She reports that she does not want to trial Seroquel because of potential side effects that she read about online.  She reports that she did try taking propranolol and it made her feel "awful" and that when she woke up her arms were "tingling."  She reports she wants to get better but is scared of side effects.  Discussed sleep hygiene with her.  Also discussed melatonin as she reports she was considering taking this.  She reports no SI, HI, or AVH.  She reports her sleep is poor.  She reports her appetite is fair.  She reports other concerns at present.   Visit  Diagnosis:    ICD-10-CM   1. Illness anxiety disorder  F45.21     2. GAD (generalized anxiety disorder)  F41.1     3. PTSD (post-traumatic stress disorder)  F43.10     4. MDD (major depressive disorder), recurrent episode, moderate (HCC)  F33.1       Past Psychiatric History: Illness Anxiety Disorder, GAD, MDD, and possible Borderline Personality Disorder, and a remote history of Self Injurious Behavior (Cutting, Burning- last ~2017), and no history of Suicide Attempts or Psychiatric Hospitalizations.   Past Medical History:  Past Medical History:  Diagnosis Date   Anxiety    Depressed    History of kidney stones    Migraine    Renal disorder     Past Surgical History:  Procedure Laterality Date   CESAREAN SECTION     CYSTOSCOPY W/ URETERAL STENT PLACEMENT Right 08/24/2016   Procedure: CYSTOSCOPY WITH RETROGRADE PYELOGRAM/URETERAL STENT PLACEMENT;  Surgeon: Cleon Gustin, MD;  Location: [facility];  Service: Urology;  Laterality: Right;   CYSTOSCOPY WITH RETROGRADE PYELOGRAM, URETEROSCOPY AND STENT PLACEMENT Right 09/09/2016   Procedure: CYSTOSCOPY WITH RETROGRADE PYELOGRAM, URETEROSCOPY AND STENT REPLACEMENT;  Surgeon: Cleon Gustin, MD;  Location: [facility];  Service: Urology;  Laterality: Right;   MANDIBLE SURGERY      Family Psychiatric History: Father- Panic Attacks, Polysubstance Abuse Brother- Substance Abuse Paternal Cousin- Completed Suicide Maternal Grandmother- Anxiety  Family History:  Family History  Problem  Relation Age of Onset   Hypertension Mother    Ulcerative colitis Mother    Alcohol abuse Father    Drug abuse Father    Anxiety disorder Father    Drug abuse Brother    Anxiety disorder Maternal Grandfather     Social History:  Social History   Socioeconomic History   Marital status: Single    Spouse name: Not on file   Number of children: 1   Years of education: Not on file   Highest education level: Some college, no  degree  Occupational History   Not on file  Tobacco Use   Smoking status: Former   Smokeless tobacco: Never  Scientific laboratory technician Use: Some days   Substances: CBD  Substance and Sexual Activity   Alcohol use: Yes    Comment: occasionally   Drug use: No   Sexual activity: Not on file  Other Topics Concern   Not on file  Social History Narrative   Not on file   Social Determinants of Health   Financial Resource Strain: Not on file  Food Insecurity: Not on file  Transportation Needs: Not on file  Physical Activity: Not on file  Stress: Not on file  Social Connections: Not on file    Allergies:  Allergies  Allergen Reactions   Sulfa Antibiotics Other (See Comments)    Unknown, happened when she was a child    Metabolic Disorder Labs: No results found for: "HGBA1C", "MPG" No results found for: "PROLACTIN" No results found for: "CHOL", "TRIG", "HDL", "CHOLHDL", "VLDL", "LDLCALC" Lab Results  Component Value Date   TSH 1.220 05/02/2022   TSH 1.006 05/31/2021    Therapeutic Level Labs: No results found for: "LITHIUM" No results found for: "VALPROATE" No results found for: "CBMZ"  Current Medications: Current Outpatient Medications  Medication Sig Dispense Refill   ALPRAZolam (XANAX) 0.5 MG tablet Take 1 tablet (0.5 mg total) by mouth 3 (three) times daily as needed for anxiety. 30 tablet 0   diclofenac (VOLTAREN) 75 MG EC tablet Take 1 tablet (75 mg total) by mouth 2 (two) times daily as needed. Do not take until finished with steroid taper (Patient not taking: Reported on 04/02/2022) 60 tablet 0   fexofenadine (ALLEGRA) 180 MG tablet Take 180 mg by mouth daily as needed for allergies.     fluticasone (FLONASE) 50 MCG/ACT nasal spray Place 1 spray into both nostrils daily. (Patient taking differently: Place 1 spray into both nostrils as needed for rhinitis or allergies.) 16 g 2   HYDROcodone-acetaminophen (NORCO) 5-325 MG tablet Take 1 tablet by mouth every 4 (four)  hours as needed for moderate pain or severe pain. (Patient not taking: Reported on 04/02/2022) 20 tablet 0   LORazepam (ATIVAN) 0.5 MG tablet Take 0.5 mg by mouth as needed for anxiety.     Multiple Vitamins-Minerals (MULTIVITAMIN ADULT) TABS Take 1 tablet by mouth daily. (Patient not taking: Reported on 04/30/2022)     PARoxetine (PAXIL) 30 MG tablet Take 30 mg by mouth daily.     predniSONE (STERAPRED UNI-PAK 21 TAB) 10 MG (21) TBPK tablet Take as directed (Patient not taking: Reported on 04/02/2022) 21 tablet 0   propranolol (INDERAL) 10 MG tablet Take 10 mg by mouth daily.     tamsulosin (FLOMAX) 0.4 MG CAPS capsule 1 q HS to aid stone passage (Patient not taking: Reported on 04/02/2022) 7 capsule 0   No current facility-administered medications for this visit.     Musculoskeletal:  Strength & Muscle Tone: within normal limits Gait & Station:  Sitting During Interview Patient leans: N/A  Psychiatric Specialty Exam: Review of Systems  Respiratory:  Negative for shortness of breath.   Cardiovascular:  Negative for chest pain.  Gastrointestinal:  Negative for abdominal pain, constipation, diarrhea, nausea and vomiting.  Neurological:  Negative for dizziness, weakness and headaches.  Psychiatric/Behavioral:  Positive for dysphoric mood and sleep disturbance. Negative for hallucinations and suicidal ideas. The patient is nervous/anxious.     Last menstrual period 05/02/2022.There is no height or weight on file to calculate BMI.  General Appearance: Casual and Fairly Groomed  Eye Contact:  Fair  Speech:  Clear and Coherent and Normal Rate  Volume:  Normal  Mood:  Anxious and Depressed  Affect:  Congruent and Depressed  Thought Process:  Coherent and Goal Directed  Orientation:  Full (Time, Place, and Person)  Thought Content: WDL and Logical   Suicidal Thoughts:  No  Homicidal Thoughts:  No  Memory:  Immediate;   Good Recent;   Good  Judgement:  Fair  Insight:  Fair  Psychomotor  Activity:  Restlessness  Concentration:  Concentration: Good and Attention Span: Good  Recall:  Good  Fund of Knowledge: Good  Language: Good  Akathisia:  Negative  Handed:  Right  AIMS (if indicated): not done  Assets:  Communication Skills Desire for Improvement Housing Physical Health Resilience Social Support  ADL's:  Intact  Cognition: WNL  Sleep:  Poor   Screenings: GAD-7    Health and safety inspector from 04/28/2022 in [facility]  Total GAD-7 Score 19      PHQ2-9    Flowsheet Row Counselor from 04/30/2022 in [facility] Counselor from 04/28/2022 in [facility]  PHQ-2 Total Score 6 6  PHQ-9 Total Score 24 20      [place] [facility] from 05/02/2022 in [facility] Counselor from 04/30/2022 in [facility] Counselor from 04/28/2022 in [place] Moderate Risk Error: Question 6 not populated Error: Question 2 not populated        Assessment and Plan:  Samantha Nguyen is a 37 yr old female who presents via Virtual Video Visit for Follow Up and  Medication Management, she enrolled in the [facility] Program on 04/29/2022.  PPHx is significant for Illness Anxiety Disorder, GAD, MDD, and possible Borderline Personality Disorder, and a remote history of Self Injurious Behavior (Cutting, Burning- last ~2017), and no history of Suicide Attempts or Psychiatric Hospitalizations.    Trinidad Curet continues to have significant hindrance due to her anxiety having gone to the ED yesterday after a panic attack.  She continues to be resistant to any medication changes.  Discussed sleep hygiene and starting melatonin to help improve her sleep.  We will not make any changes to her medications at this time.  We will continue to monitor.   Illness Anxiety Disorder  GAD  MDD, Recurrent, Moderate   PTSD: -Continue Paxil 20 mg daily.  No refills sent at this time. -Continue Ativan 0.5 mg daily PRN.  No refills sent at this time. -Prescribed Propanolol 10 mg daily but not taking at this time    Collaboration of Care: Collaboration of Care: Other PHP Program  Patient/Guardian was advised Release of Information must be obtained prior to any record release in order to collaborate their care with an outside provider. Patient/Guardian  was advised if they have not already done so to contact the registration department to sign all necessary forms in order for [name] to release information regarding their care.   Consent: Patient/Guardian gives verbal consent for treatment and assignment of benefits for services provided during this visit. Patient/Guardian expressed understanding and agreed to proceed.    Briant Cedar, MD 05/04/2022, 7:02 AM   Follow Up Instructions:    I discussed the assessment and treatment plan with the patient. The patient was provided an opportunity to ask questions and all were answered. The patient agreed with the plan and demonstrated an understanding of the instructions.   The patient was advised to call back or seek an in-person evaluation if the symptoms worsen or if the condition fails to improve as anticipated.  I provided 25 minutes of non-face-to-face time during this encounter.   Briant Cedar, MD

## 2022-05-03 NOTE — Psych (Signed)
Virtual Visit via Video Note  I connected with Samantha Nguyen on 04/29/22 at  9:00 AM EST by a video enabled telemedicine application and verified that I am speaking with the correct person using two identifiers.  Location: Patient: patient home Provider: clinical home office   I discussed the limitations of evaluation and management by telemedicine and the availability of in person appointments. The patient expressed understanding and agreed to proceed.  I discussed the assessment and treatment plan with the patient. The patient was provided an opportunity to ask questions and all were answered. The patient agreed with the plan and demonstrated an understanding of the instructions.   The patient was advised to call back or seek an in-person evaluation if the symptoms worsen or if the condition fails to improve as anticipated.  Pt was provided 240 minutes of non-face-to-face time during this encounter.   Lorin Glass, LCSW   [facility] PHP THERAPIST PROGRESS NOTE  Taylir Gilkes [id number]  Session Time: 9:00 -10:00  Participation Level: Active  Behavioral Response: CasualAlertAnxious  Type of Therapy: Group Therapy  Treatment Goals addressed: Coping  Progress Towards Goals: Initial  Interventions: CBT, DBT, Supportive, and Reframing  Summary: Samantha Nguyen is a 37 y.o. female who presents with anxiety.  Clinician led check-in regarding current stressors and situation, and review of patient completed daily inventory. Clinician utilized active listening and empathetic response and validated patient emotions. Clinician facilitated processing group on pertinent issues.?    Therapist Response: Patient arrived within time allowed. Patient rates her mood at a 3 on a scale of 1-10 with 10 being best. Pt states she feels "okay." Pt states she slept 6.5 hours and ate 1x. Pt reports struggling with medical/health anxiety and often thinks she will have a heart attack/is dying. Pt reports low  trust in medical professionals due to past trauma of being misdiagnosed. Pt states feeling "crazy" and isolated because "no one else is going through what I am." Pt states high hopelessness and denies SI. Pt is tearful throughout session. Patient able to process. Patient engaged in discussion.             Session Time: 10:00 am - 11:00 am   Participation Level: Active   Behavioral Response: CasualAlertDepressed   Type of Therapy: Group Therapy   Treatment Goals addressed: Coping   Progress Towards Goals: Progressing   Interventions: CBT, DBT, Solution Focused, Strength-based, Supportive, and Reframing   Therapist Response: Cln led discussion on knowing our limits, especially when they need to be adjusted due to fluctuation in our energy, mood, and functioning. Cln discussed limits may be taking a break from a conversation, scheduling less activities during the day, and/or being willing to stop and rest when needed. Group members shared times they have been exhausted or overwhelmed recently, and cln helped work back to determine where setting a limit may have prevented them from getting to that point. Group discussed barriers to setting limits for themselves and brainstormed ways to address those barriers.    Therapist Response: Pt engaged in discussion and is able to identify where they could set limits to help their recovery.            Session Time: 11:00 -12:00   Participation Level: Active   Behavioral Response: CasualAlertDepressed   Type of Therapy: Group Therapy   Treatment Goals addressed: Coping   Progress Towards Goals: Progressing   Interventions: CBT, DBT, Solution Focused, Strength-based, Supportive, and Reframing   Summary: Cln continued topic of CBT  cognitive distortions. Cln utilized Web designer questions" as a way to introduce challenges and reframe distorted thinking. Group members worked through pt examples to practice challenging distorted thinking.     Therapist Response: Pt engaged in discussion and demonstrates understanding of challenging distorted thoughts through practice.            Session Time: 12:00 -1:00   Participation Level: Active   Behavioral Response: CasualAlertDepressed   Type of Therapy: Group therapy, Occupational Therapy   Treatment Goals addressed: Coping   Progress Towards Goals: Progressing   Interventions: Supportive; Psychoeducation   Summary: 12:00 - 12:50: Occupational Therapy group led by cln E. Hollan. 12:50 - 1:00 Clinician assessed for immediate needs, medication compliance and efficacy, and safety concerns.   Therapist Response: 12:00 - 12:50: Pt participated 12:50 - 1:00 pm: At check-out, patient reports no immediate concerns. Patient demonstrates progress as evidenced by participating in first group session. Patient denies SI/HI/self-harm thoughts at the end of group.    Suicidal/Homicidal: Nowithout intent/plan   Plan: Pt will continue in PHP while working to decrease anxiety symptoms, increase emotion regulation, and increase ability to manage symptoms in a healthy manner.   Collaboration of Care: Medication Management AEB A Pashayan  Patient/Guardian was advised Release of Information must be obtained prior to any record release in order to collaborate their care with an outside provider. Patient/Guardian was advised if they have not already done so to contact the registration department to sign all necessary forms in order for [name] to release information regarding their care.   Consent: Patient/Guardian gives verbal consent for treatment and assignment of benefits for services provided during this visit. Patient/Guardian expressed understanding and agreed to proceed.   Diagnosis: Illness anxiety disorder [F45.21]    1. Illness anxiety disorder   2. GAD (generalized anxiety disorder)   3. PTSD (post-traumatic stress disorder)   4. MDD (major depressive disorder), recurrent episode,  moderate ([place])       Lorin Glass, LCSW 05/03/2022

## 2022-05-04 ENCOUNTER — Encounter (HOSPITAL_COMMUNITY): Payer: Self-pay

## 2022-05-04 ENCOUNTER — Ambulatory Visit (HOSPITAL_COMMUNITY): Payer: Medicaid Other

## 2022-05-04 ENCOUNTER — Ambulatory Visit (INDEPENDENT_AMBULATORY_CARE_PROVIDER_SITE_OTHER): Payer: Medicaid Other | Admitting: Licensed Clinical Social Worker

## 2022-05-04 DIAGNOSIS — F4521 Hypochondriasis: Secondary | ICD-10-CM

## 2022-05-04 DIAGNOSIS — F411 Generalized anxiety disorder: Secondary | ICD-10-CM

## 2022-05-04 DIAGNOSIS — F331 Major depressive disorder, recurrent, moderate: Secondary | ICD-10-CM

## 2022-05-05 ENCOUNTER — Ambulatory Visit (INDEPENDENT_AMBULATORY_CARE_PROVIDER_SITE_OTHER): Payer: Medicaid Other | Admitting: Licensed Clinical Social Worker

## 2022-05-05 ENCOUNTER — Ambulatory Visit (HOSPITAL_COMMUNITY): Payer: Medicaid Other

## 2022-05-05 DIAGNOSIS — F4310 Post-traumatic stress disorder, unspecified: Secondary | ICD-10-CM

## 2022-05-05 DIAGNOSIS — F4521 Hypochondriasis: Secondary | ICD-10-CM

## 2022-05-05 DIAGNOSIS — F411 Generalized anxiety disorder: Secondary | ICD-10-CM

## 2022-05-05 DIAGNOSIS — F331 Major depressive disorder, recurrent, moderate: Secondary | ICD-10-CM

## 2022-05-06 ENCOUNTER — Ambulatory Visit (INDEPENDENT_AMBULATORY_CARE_PROVIDER_SITE_OTHER): Payer: Medicaid Other | Admitting: Licensed Clinical Social Worker

## 2022-05-06 ENCOUNTER — Ambulatory Visit (HOSPITAL_COMMUNITY): Payer: Medicaid Other

## 2022-05-06 DIAGNOSIS — F331 Major depressive disorder, recurrent, moderate: Secondary | ICD-10-CM

## 2022-05-06 DIAGNOSIS — F411 Generalized anxiety disorder: Secondary | ICD-10-CM | POA: Diagnosis not present

## 2022-05-06 DIAGNOSIS — F4310 Post-traumatic stress disorder, unspecified: Secondary | ICD-10-CM

## 2022-05-06 DIAGNOSIS — F4521 Hypochondriasis: Secondary | ICD-10-CM

## 2022-05-07 ENCOUNTER — Other Ambulatory Visit: Payer: Self-pay

## 2022-05-07 ENCOUNTER — Ambulatory Visit (INDEPENDENT_AMBULATORY_CARE_PROVIDER_SITE_OTHER): Payer: Medicaid Other | Admitting: Licensed Clinical Social Worker

## 2022-05-07 ENCOUNTER — Ambulatory Visit (HOSPITAL_COMMUNITY): Payer: Medicaid Other

## 2022-05-07 DIAGNOSIS — F411 Generalized anxiety disorder: Secondary | ICD-10-CM | POA: Diagnosis not present

## 2022-05-07 DIAGNOSIS — F331 Major depressive disorder, recurrent, moderate: Secondary | ICD-10-CM

## 2022-05-07 DIAGNOSIS — F4521 Hypochondriasis: Secondary | ICD-10-CM

## 2022-05-07 NOTE — Progress Notes (Unsigned)
Spoke with patient via WebEx video call, used 2 identifiers to correctly identify patient. Groups are going well. She stopped her Inderal after it made her feel worse. Anxiety is better. On scale 1-10 as 10 being worse she rates depression at 5 an anxiety at 6. Denies SI/HI or AV hallucinations. Was triggered earlier when another patient was discussing her health anxiety. Was able to calm herself. No other issues or complaints. No side effects from medications.

## 2022-05-07 NOTE — Progress Notes (Signed)
CM called pt to check in; CM notified pt that her Medicaid is still not active. Pt tried to call the Medicaid helpline and could not get through. CM provided pt with the DSS Medicaid number.     Pt notes that she has BV, her breathing is getting worse, and she has a heel spur. Pt also notes pain from a knot in her shoulder.     Pt would like to be seen, however she is aware that she does not currently have health insurance.    [facility]  O: [phone]  C: [phone]

## 2022-05-07 NOTE — Progress Notes (Signed)
CM called pt to check in; call went to VM. CM left a brief message requesting a call back.    Tannisha Kennington  Health Home Care Manager  [facility]  O: [phone]  C: [phone]

## 2022-05-09 NOTE — Psych (Signed)
Virtual Visit via Video Note  I connected with Samantha Nguyen on 05/03/22 at  9:00 AM EST by a video enabled telemedicine application and verified that I am speaking with the correct person using two identifiers.  Location: Patient: patient home Provider: clinical home office   I discussed the limitations of evaluation and management by telemedicine and the availability of in person appointments. The patient expressed understanding and agreed to proceed.  I discussed the assessment and treatment plan with the patient. The patient was provided an opportunity to ask questions and all were answered. The patient agreed with the plan and demonstrated an understanding of the instructions.   The patient was advised to call back or seek an in-person evaluation if the symptoms worsen or if the condition fails to improve as anticipated.  Pt was provided 240 minutes of non-face-to-face time during this encounter.   Lorin Glass, LCSW   [facility] PHP THERAPIST PROGRESS NOTE  Almena Romanik [id number]  Session Time: 9:00 -10:00  Participation Level: Active  Behavioral Response: CasualAlertAnxious  Type of Therapy: Group Therapy  Treatment Goals addressed: Coping  Progress Towards Goals: Initial  Interventions: CBT, DBT, Supportive, and Reframing  Summary: Samantha Nguyen is a 37 y.o. female who presents with anxiety.  Clinician led check-in regarding current stressors and situation, and review of patient completed daily inventory. Clinician utilized active listening and empathetic response and validated patient emotions. Clinician facilitated processing group on pertinent issues.?    Therapist Response: Patient arrived within time allowed. Patient rates her mood at a 3 on a scale of 1-10 with 10 being best. Pt states she feels "really anxious." Pt states she slept 6 broken hours and ate 1x. Pt reports she woke up on Saturday without feeling anxious, however became anxious after dwelling on it. Pt  shares she also left the house and ran errands with her partner. Pt states she went to the ED on Sunday and spent a majority of the day there due to concerns about her heart and high hopelessness. Pt reports her medical tests re: heart were all normal and anxiety was the cause of her concerns. Pt states doing better after being "promised" by the doctor she was okay. Pt states she was able to eat most of a meal and a snack afterwards. Pt states feeling guilty because Sunday was also her partner's birthday.  Pt states high hopelessness and denies SI. Pt is tearful throughout session and elevated. Pt continues to struggle with disqualifying the positive and challenging her unhelpful thoughts.  Patient able to process. Patient engaged in discussion.             Session Time: 10:00 am - 11:00 am   Participation Level: Active   Behavioral Response: CasualAlertDepressed   Type of Therapy: Group Therapy   Treatment Goals addressed: Coping   Progress Towards Goals: Progressing   Interventions: CBT, DBT, Solution Focused, Strength-based, Supportive, and Reframing   Therapist Response: Cln led discussion on communicating our struggles with our support team. Cln introduced the concept of giving disclosures to the people close to [name] regarding the way we act in specific situations or the way we process certain stimuli. Group members discussed ways to provide this "road map to our brain" and in what situations this may be helpful to them   Therapist Response: Pt engaged in discussion and is able to apply concepts to their life.            Session Time: 11:00 -12:00  Participation Level: Active   Behavioral Response: CasualAlertDepressed   Type of Therapy: Group Therapy   Treatment Goals addressed: Coping   Progress Towards Goals: Progressing   Interventions: CBT, DBT, Solution Focused, Strength-based, Supportive, and Reframing   Summary: Cln led discussion on unrealistic expectations and  the ways expectations can alter perspective. Group members discussed feelings and situations that have occurred due to expectation and how to know if they are unrealistic. Cln brought in CBT thought challenging and reframing to aid discussion.    Therapist Response:  Pt engaged in discussion and reports gaining insight.            Session Time: 12:00 -1:00   Participation Level: Active   Behavioral Response: CasualAlertDepressed   Type of Therapy: Group therapy, Occupational Therapy   Treatment Goals addressed: Coping   Progress Towards Goals: Progressing   Interventions: Supportive; Psychoeducation   Summary: 12:00 - 12:50: Occupational Therapy group led by cln E. Hollan. 12:50 - 1:00 Clinician assessed for immediate needs, medication compliance and efficacy, and safety concerns.   Therapist Response: 12:00 - 12:50: Pt participated 12:50 - 1:00 pm: At check-out, patient reports no immediate concerns. Patient demonstrates progress as evidenced by continued engagement and responsiveness to treatment. Patient denies SI/HI/self-harm thoughts at the end of group.    Suicidal/Homicidal: Nowithout intent/plan   Plan: Pt will continue in PHP while working to decrease anxiety symptoms, increase emotion regulation, and increase ability to manage symptoms in a healthy manner.   Collaboration of Care: Medication Management AEB A Pashayan  Patient/Guardian was advised Release of Information must be obtained prior to any record release in order to collaborate their care with an outside provider. Patient/Guardian was advised if they have not already done so to contact the registration department to sign all necessary forms in order for [name] to release information regarding their care.   Consent: Patient/Guardian gives verbal consent for treatment and assignment of benefits for services provided during this visit. Patient/Guardian expressed understanding and agreed to proceed.    Diagnosis: Illness anxiety disorder [F45.21]    1. Illness anxiety disorder   2. GAD (generalized anxiety disorder)   3. PTSD (post-traumatic stress disorder)   4. MDD (major depressive disorder), recurrent episode, moderate ([place])       Lorin Glass, LCSW 05/09/2022

## 2022-05-09 NOTE — Psych (Signed)
Virtual Visit via Video Note  I connected with Samantha Nguyen on 05/04/22 at  9:00 AM EST by a video enabled telemedicine application and verified that I am speaking with the correct person using two identifiers.  Location: Patient: patient home Provider: clinical home office   I discussed the limitations of evaluation and management by telemedicine and the availability of in person appointments. The patient expressed understanding and agreed to proceed.  I discussed the assessment and treatment plan with the patient. The patient was provided an opportunity to ask questions and all were answered. The patient agreed with the plan and demonstrated an understanding of the instructions.   The patient was advised to call back or seek an in-person evaluation if the symptoms worsen or if the condition fails to improve as anticipated.  Pt was provided 240 minutes of non-face-to-face time during this encounter.   Lorin Glass, LCSW   [facility] PHP THERAPIST PROGRESS NOTE  Samantha Nguyen [id number]  Session Time: 9:00 -10:00  Participation Level: Active  Behavioral Response: CasualAlertAnxious  Type of Therapy: Group Therapy  Treatment Goals addressed: Coping  Progress Towards Goals: Initial  Interventions: CBT, DBT, Supportive, and Reframing  Summary: Samantha Nguyen is a 37 y.o. female who presents with anxiety.  Clinician led check-in regarding current stressors and situation, and review of patient completed daily inventory. Clinician utilized active listening and empathetic response and validated patient emotions. Clinician facilitated processing group on pertinent issues.?    Therapist Response: Patient arrived within time allowed. Patient rates her mood at a 4.5 on a scale of 1-10 with 10 being best. Pt states she feels "anxious." Pt states she slept 5 hours and had 2, 2 hour naps, and ate 3x. Pt reports struggling with worthlessness and hopelessness yesterday. Pt states taking a bath to  help cope. Pt states waking up from each nap with heightened anxiety and having continued panic due to waking up in this manner. Pt reports taking 2, 0.5 Xanax to help. Pt is notably not tearful in session. Pt continues to struggle with distorted thinking. Patient able to process. Patient engaged in discussion.             Session Time: 10:00 am - 11:00 am   Participation Level: Active   Behavioral Response: CasualAlertDepressed   Type of Therapy: Group Therapy   Treatment Goals addressed: Coping   Progress Towards Goals: Progressing   Interventions: CBT, DBT, Solution Focused, Strength-based, Supportive, and Reframing   Therapist Response: Cln led discussion on Maslow's Hierarchy of Needs and how it can inform the way we approach self care and recovery.    Therapist Response: Pt engaged in discussion and identifies they are in the bottom two tiers of the pyramid.           Session Time: 11:00 -12:00   Participation Level: Active   Behavioral Response: CasualAlertDepressed   Type of Therapy: Group Therapy   Treatment Goals addressed: Coping   Progress Towards Goals: Progressing   Interventions: CBT, DBT, Solution Focused, Strength-based, Supportive, and Reframing   Summary: Cln led discussion on trust and how to establish healthy trust. Group discussed common missteps such as disclosing personal information too soon, ignoring behaviors being displayed, inaccurately defining the relationship, and not giving people credit. Cln utilized CBT thought challenging principles to encourage pt's to rely on "evidence" versus feelings. Group members shared struggles they experience with trust.    Therapist Response:  Pt engaged in discussion and is able to identify areas of  improvement in their trust process.          Session Time: 12:00 -1:00   Participation Level: Active   Behavioral Response: CasualAlertDepressed   Type of Therapy: Group therapy   Treatment Goals  addressed: Coping   Progress Towards Goals: Progressing   Interventions: CBT, DBT, Solution Focused, Strength-based, Supportive, and Reframing   Summary: 12:00 - 12:50: Cln introduced topic of boundaries. Cln discussed how boundaries inform our relationships and affect self-esteem and personal agency. Group discussed the three types of boundaries: rigid, porous, and healthy and when each type is most helpful/harmful.  12:50 - 1:00 Clinician assessed for immediate needs, medication compliance and efficacy, and safety concerns.   Therapist Response: 12:00 - 12:50: Pt engaged in discussion and reports understanding.  12:50 - 1:00 pm: At check-out, patient reports no immediate concerns. Patient demonstrates progress as evidenced by continued engagement and responsiveness to treatment. Patient denies SI/HI/self-harm thoughts at the end of group.    Suicidal/Homicidal: Nowithout intent/plan   Plan: Pt will continue in PHP while working to decrease anxiety symptoms, increase emotion regulation, and increase ability to manage symptoms in a healthy manner.   Collaboration of Care: Medication Management AEB A Pashayan  Patient/Guardian was advised Release of Information must be obtained prior to any record release in order to collaborate their care with an outside provider. Patient/Guardian was advised if they have not already done so to contact the registration department to sign all necessary forms in order for [name] to release information regarding their care.   Consent: Patient/Guardian gives verbal consent for treatment and assignment of benefits for services provided during this visit. Patient/Guardian expressed understanding and agreed to proceed.   Diagnosis: GAD (generalized anxiety disorder) [F41.1]    1. GAD (generalized anxiety disorder)   2. Illness anxiety disorder   3. MDD (major depressive disorder), recurrent episode, moderate ([place])       Lorin Glass, LCSW 05/09/2022

## 2022-05-10 ENCOUNTER — Ambulatory Visit (INDEPENDENT_AMBULATORY_CARE_PROVIDER_SITE_OTHER): Payer: Medicaid Other | Admitting: Licensed Clinical Social Worker

## 2022-05-10 ENCOUNTER — Ambulatory Visit (HOSPITAL_COMMUNITY): Payer: Medicaid Other

## 2022-05-10 DIAGNOSIS — F411 Generalized anxiety disorder: Secondary | ICD-10-CM | POA: Diagnosis not present

## 2022-05-10 DIAGNOSIS — F331 Major depressive disorder, recurrent, moderate: Secondary | ICD-10-CM

## 2022-05-10 DIAGNOSIS — F4521 Hypochondriasis: Secondary | ICD-10-CM

## 2022-05-10 NOTE — Psych (Signed)
Virtual Visit via Video Note  I connected with Samantha Nguyen on 05/05/22 at  9:00 AM EST by a video enabled telemedicine application and verified that I am speaking with the correct person using two identifiers.  Location: Patient: patient home Provider: clinical home office   I discussed the limitations of evaluation and management by telemedicine and the availability of in person appointments. The patient expressed understanding and agreed to proceed.  I discussed the assessment and treatment plan with the patient. The patient was provided an opportunity to ask questions and all were answered. The patient agreed with the plan and demonstrated an understanding of the instructions.   The patient was advised to call back or seek an in-person evaluation if the symptoms worsen or if the condition fails to improve as anticipated.  Pt was provided 240 minutes of non-face-to-face time during this encounter.   Lorin Glass, LCSW   [facility] PHP THERAPIST PROGRESS NOTE  Aleyssa Palomar [id number]  Session Time: 9:00 -10:00  Participation Level: Active  Behavioral Response: CasualAlertAnxious  Type of Therapy: Group Therapy  Treatment Goals addressed: Coping  Progress Towards Goals: Progressing  Interventions: CBT, DBT, Supportive, and Reframing  Summary: Samantha Nguyen is a 37 y.o. female who presents with anxiety.  Clinician led check-in regarding current stressors and situation, and review of patient completed daily inventory. Clinician utilized active listening and empathetic response and validated patient emotions. Clinician facilitated processing group on pertinent issues.?    Therapist Response: Patient arrived within time allowed. Patient rates her mood at a 5.5 on a scale of 1-10 with 10 being best. Pt states she feels "okay." Pt states she slept 6 broken hours and had 1, 3 hour nap, and ate 3x. Pt reports she continues to wake up with anxiety and had vivid, twilight dreams which  upset her. Pt demonstrates increased ability to challenge and coach herself using CBT. Pt states taking baths and keeping her hands busy as ways she is helping manage her symptoms. Pt identifies some feelings of hopelessness and becomes easily emotional when thinking about "when will this end? I just want to be my normal self." Patient able to process. Patient engaged in discussion.             Session Time: 10:00 am - 11:00 am   Participation Level: Active   Behavioral Response: CasualAlertDepressed   Type of Therapy: Group Therapy   Treatment Goals addressed: Coping   Progress Towards Goals: Progressing   Interventions: CBT, DBT, Solution Focused, Strength-based, Supportive, and Reframing   Therapist Response: Cln led discussion on coping skills that can help in intense feelings. Cln discussed DBT TIP skills of temperature and intense exercise. Cln shared DBT ACCEPTS sensation skill. Group discussed how to apply these skills and how to incorporate them into their coping plans.    Therapist Response: Pt engaged in discussion and was able to brainstorm ways to apply skills.            Session Time: 11:00 -12:00   Participation Level: Active   Behavioral Response: CasualAlertDepressed   Type of Therapy: Group Therapy   Treatment Goals addressed: Coping   Progress Towards Goals: Progressing   Interventions: CBT, DBT, Solution Focused, Strength-based, Supportive, and Reframing   Summary: Cln continued topic of boundaries. Cln discussed the different ways boundaries present: physical, emotional, intellectual, sexual, material, and time. Group talked about the ways in which each type presents for them and is a struggle.    Therapist Response:  Pt  engaged in discussion and is able to discuss ways in which the different boundary types present in their own life.          Session Time: 12:00 -1:00   Participation Level: Active   Behavioral Response: CasualAlertDepressed    Type of Therapy: Group therapy   Treatment Goals addressed: Coping   Progress Towards Goals: Progressing   Interventions: CBT, DBT, Solution Focused, Strength-based, Supportive, and Reframing   Summary: 12:00 - 12:50: Cln continued topic of boundaries and introduced how to set and maintain healthy boundaries. Cln utilized handout "how to set boundaries" and group members worked through examples to practice setting appropriate boundaries.  12:50 - 1:00 Clinician assessed for immediate needs, medication compliance and efficacy, and safety concerns.   Therapist Response: 12:00 - 12:50: Pt engaged in discussion and demonstrates understanding through practice.  12:50 - 1:00 pm: At check-out, patient reports no immediate concerns. Patient demonstrates progress as evidenced by continued engagement and responsiveness to treatment. Patient denies SI/HI/self-harm thoughts at the end of group.    Suicidal/Homicidal: Nowithout intent/plan   Plan: Pt will continue in PHP while working to decrease anxiety symptoms, increase emotion regulation, and increase ability to manage symptoms in a healthy manner.   Collaboration of Care: Medication Management AEB A Pashayan  Patient/Guardian was advised Release of Information must be obtained prior to any record release in order to collaborate their care with an outside provider. Patient/Guardian was advised if they have not already done so to contact the registration department to sign all necessary forms in order for [name] to release information regarding their care.   Consent: Patient/Guardian gives verbal consent for treatment and assignment of benefits for services provided during this visit. Patient/Guardian expressed understanding and agreed to proceed.   Diagnosis: GAD (generalized anxiety disorder) [F41.1]    1. GAD (generalized anxiety disorder)   2. Illness anxiety disorder   3. MDD (major depressive disorder), recurrent episode, moderate ([place])    4. PTSD (post-traumatic stress disorder)       Lorin Glass, LCSW 05/10/2022

## 2022-05-10 NOTE — Psych (Signed)
Virtual Visit via Video Note  I connected with Samantha Nguyen on 05/07/22 at  9:00 AM EST by a video enabled telemedicine application and verified that I am speaking with the correct person using two identifiers.  Location: Patient: patient home Provider: clinical home office   I discussed the limitations of evaluation and management by telemedicine and the availability of in person appointments. The patient expressed understanding and agreed to proceed.  I discussed the assessment and treatment plan with the patient. The patient was provided an opportunity to ask questions and all were answered. The patient agreed with the plan and demonstrated an understanding of the instructions.   The patient was advised to call back or seek an in-person evaluation if the symptoms worsen or if the condition fails to improve as anticipated.  Pt was provided 240 minutes of non-face-to-face time during this encounter.   Lorin Glass, LCSW   [facility] PHP THERAPIST PROGRESS NOTE  [name] [id number]  Session Time: 9:00 -10:00  Participation Level: Active  Behavioral Response: CasualAlertAnxious  Type of Therapy: Group Therapy  Treatment Goals addressed: Coping  Progress Towards Goals: Progressing  Interventions: CBT, DBT, Supportive, and Reframing  Summary: Samantha Nguyen is a 37 y.o. female who presents with anxiety.  Clinician led check-in regarding current stressors and situation, and review of patient completed daily inventory. Clinician utilized active listening and empathetic response and validated patient emotions. Clinician facilitated processing group on pertinent issues.?    Therapist Response: Patient arrived within time allowed. Patient rates her mood at a 6.5 on a scale of 1-10 with 10 being best. Pt states she feels "okay." Pt states she slept 6 hours and ate 2x. Pt reports she woke up earlier than desired and with anxiety however is holding increase acceptance today stating "it  is what it is. I went ahead and got up." Pt shares she worked on labeling feelings and practicing mindfulness yesterday which helped ground her. Pt reports she is having anxiety re" returning to work.  Patient able to process. Patient engaged in discussion.             Session Time: 10:00 am - 11:00 am   Participation Level: Active   Behavioral Response: CasualAlertDepressed   Type of Therapy: Group Therapy   Treatment Goals addressed: Coping   Progress Towards Goals: Progressing   Interventions: CBT, DBT, Solution Focused, Strength-based, Supportive, and Reframing   Therapist Response: Cln led discussion on ways to manage stressors and feelings over the weekend. Group members  brainstormed things to do over the weekend for multiple levels of energy, access, and moods. Cln reviewed crisis services should they be needed and provided pt's with the text crisis line, mobile crisis, national suicide hotline, [facility] 24/7 line, and information on [facility].      Therapist Response:  Pt reports high anxiety about the weekend and managing without group. Pt engaged in discussion and is able to identify 3 ideas of what to do over the weekend to keep their mind engaged.        Session Time: 11:00 -12:00   Participation Level: Active   Behavioral Response: CasualAlertDepressed   Type of Therapy: Group Therapy, Spiritual Care   Treatment Goals addressed: Coping   Progress Towards Goals: Progressing   Interventions: Supportive, Education   Summary:  [name], Chaplain, led group.   Therapist Response: Pt participated           Session Time: 12:00 -1:00   Participation Level: Active  Behavioral Response: CasualAlertDepressed   Type of Therapy: Group therapy, Occupational Therapy   Treatment Goals addressed: Coping   Progress Towards Goals: Progressing   Interventions: Supportive; Psychoeducation   Summary: 12:00 - 12:50: Occupational Therapy group led by cln E.  Hollan. 12:50 - 1:00 Clinician assessed for immediate needs, medication compliance and efficacy, and safety concerns.   Therapist Response: 12:00 - 12:50: Pt participated 12:50 - 1:00 pm: At check-out, patient reports no immediate concerns. Patient demonstrates progress as evidenced by continued engagement and responsiveness to treatment. Patient denies SI/HI/self-harm thoughts at the end of group.    Suicidal/Homicidal: Nowithout intent/plan   Plan: Pt will continue in PHP while working to decrease anxiety symptoms, increase emotion regulation, and increase ability to manage symptoms in a healthy manner.   Collaboration of Care: Medication Management AEB A Pashayan  Patient/Guardian was advised Release of Information must be obtained prior to any record release in order to collaborate their care with an outside provider. Patient/Guardian was advised if they have not already done so to contact the registration department to sign all necessary forms in order for [name] to release information regarding their care.   Consent: Patient/Guardian gives verbal consent for treatment and assignment of benefits for services provided during this visit. Patient/Guardian expressed understanding and agreed to proceed.   Diagnosis: GAD (generalized anxiety disorder) [F41.1]    1. GAD (generalized anxiety disorder)   2. Illness anxiety disorder   3. MDD (major depressive disorder), recurrent episode, moderate ([place])       Lorin Glass, LCSW 05/10/2022

## 2022-05-10 NOTE — Psych (Signed)
Virtual Visit via Video Note  I connected with Samantha Nguyen on 05/06/22 at  9:00 AM EST by a video enabled telemedicine application and verified that I am speaking with the correct person using two identifiers.  Location: Patient: patient home Provider: clinical home office   I discussed the limitations of evaluation and management by telemedicine and the availability of in person appointments. The patient expressed understanding and agreed to proceed.  I discussed the assessment and treatment plan with the patient. The patient was provided an opportunity to ask questions and all were answered. The patient agreed with the plan and demonstrated an understanding of the instructions.   The patient was advised to call back or seek an in-person evaluation if the symptoms worsen or if the condition fails to improve as anticipated.  Pt was provided 240 minutes of non-face-to-face time during this encounter.   Lorin Glass, LCSW   [facility] PHP THERAPIST PROGRESS NOTE  Samantha Nguyen [id number]  Session Time: 9:00 -10:00  Participation Level: Active  Behavioral Response: CasualAlertAnxious  Type of Therapy: Group Therapy  Treatment Goals addressed: Coping  Progress Towards Goals: Progressing  Interventions: CBT, DBT, Supportive, and Reframing  Summary: Samantha Nguyen is a 37 y.o. female who presents with anxiety.  Clinician led check-in regarding current stressors and situation, and review of patient completed daily inventory. Clinician utilized active listening and empathetic response and validated patient emotions. Clinician facilitated processing group on pertinent issues.?    Therapist Response: Patient arrived within time allowed. Patient rates her mood at a 6 on a scale of 1-10 with 10 being best. Pt states she feels "okay." Pt states she slept 9 broken hours and ate 2x. Pt reports she went to her mother-in-law's house for dinner and was able to stay for the duration of the visit.  Pt reports high anxiety leading up to leaving as she has not been in social situations in the past few weeks. Pt reports she felt anxious and was able to manage it and eat dinner. Pt demonstrates increased ability to ride through feelings without panicking. Pt states some hopelessness. Patient able to process. Patient engaged in discussion.             Session Time: 10:00 am - 11:00 am   Participation Level: Active   Behavioral Response: CasualAlertDepressed   Type of Therapy: Group Therapy   Treatment Goals addressed: Coping   Progress Towards Goals: Progressing   Interventions: CBT, DBT, Solution Focused, Strength-based, Supportive, and Reframing   Therapist Response: Cln led discussion on planning ahead as a way to mitigate anxiety. Group members shared worries about keeping up good habits when returning to "normal" life post-treatment. Group able to brainstorm ways to build habits now and how they can fit into their life after treatment.    Therapist Response: Pt engaged in discussion and identified ways to plan ahead for current anxieties.            Session Time: 11:00 -12:00   Participation Level: Active   Behavioral Response: CasualAlertDepressed   Type of Therapy: Group Therapy   Treatment Goals addressed: Coping   Progress Towards Goals: Progressing   Interventions: CBT, DBT, Solution Focused, Strength-based, Supportive, and Reframing   Summary: Cln continued topic of boundaries and led a "boundary workshop" in which group members brought current boundary issues and group worked together to apply boundary concepts to help address the concern. Cln helped shape conversation to maintain fidelity.    Therapist Response:  Pt participated  in discussion and reports gaining insight.            Session Time: 12:00 -1:00   Participation Level: Active   Behavioral Response: CasualAlertDepressed   Type of Therapy: Group therapy, Occupational Therapy   Treatment  Goals addressed: Coping   Progress Towards Goals: Progressing   Interventions: Supportive; Psychoeducation   Summary: 12:00 - 12:50: Occupational Therapy group led by cln E. Hollan. 12:50 - 1:00 Clinician assessed for immediate needs, medication compliance and efficacy, and safety concerns.   Therapist Response: 12:00 - 12:50: Pt participated 12:50 - 1:00 pm: At check-out, patient reports no immediate concerns. Patient demonstrates progress as evidenced by continued engagement and responsiveness to treatment. Patient denies SI/HI/self-harm thoughts at the end of group.    Suicidal/Homicidal: Nowithout intent/plan   Plan: Pt will continue in PHP while working to decrease anxiety symptoms, increase emotion regulation, and increase ability to manage symptoms in a healthy manner.   Collaboration of Care: Medication Management AEB A Pashayan  Patient/Guardian was advised Release of Information must be obtained prior to any record release in order to collaborate their care with an outside provider. Patient/Guardian was advised if they have not already done so to contact the registration department to sign all necessary forms in order for [name] to release information regarding their care.   Consent: Patient/Guardian gives verbal consent for treatment and assignment of benefits for services provided during this visit. Patient/Guardian expressed understanding and agreed to proceed.   Diagnosis: GAD (generalized anxiety disorder) [F41.1]    1. GAD (generalized anxiety disorder)   2. Illness anxiety disorder   3. MDD (major depressive disorder), recurrent episode, moderate ([place])   4. PTSD (post-traumatic stress disorder)       Lorin Glass, LCSW 05/10/2022

## 2022-05-10 NOTE — Progress Notes (Signed)
Spoke with patient via WebEx video call, used 2 identifiers correctly identify patient. When asked how she was doing she began crying. States that she woke up at 5am with a racing heart and it scared her. Believes she is dying and something is happening to her body. Checking her temperature multiple times a day. States she took a Xanax at [employer] and felt guilty that she has to rely on medication. She drank a protein shake a couple hours later and vomited. Very nervous and anxious today. Denies SI/HI or AV hallucinations. On scale 1-10 as 10 being worst she rates depression at 8/9 and anxiety at 10. Was able to calm down and take deep breaths. She says she will go outside and breath in fresh air today. No other issues or complaints.

## 2022-05-10 NOTE — Progress Notes (Signed)
Assessment Utilized: Comprehensive Assessment  Names/Roles of participants: [place].  Description of Client participation: Information gathered from previous discussion and chart review.  Status of completion of assessment: Assessment completed.  Summary of findings relevant to Care Management work: Pt without Medicaid at this time and needs to contact Medicaid to restart her insurance.  Next steps to address findings: CM to support pt in getting back Medicaid.    [facility]  O: [phone]  C: [phone]

## 2022-05-11 ENCOUNTER — Ambulatory Visit (INDEPENDENT_AMBULATORY_CARE_PROVIDER_SITE_OTHER): Payer: Medicaid Other | Admitting: Licensed Clinical Social Worker

## 2022-05-11 ENCOUNTER — Encounter (HOSPITAL_COMMUNITY): Payer: Self-pay

## 2022-05-11 ENCOUNTER — Ambulatory Visit (HOSPITAL_COMMUNITY): Payer: Medicaid Other

## 2022-05-11 DIAGNOSIS — F411 Generalized anxiety disorder: Secondary | ICD-10-CM

## 2022-05-11 DIAGNOSIS — F4521 Hypochondriasis: Secondary | ICD-10-CM

## 2022-05-11 DIAGNOSIS — F331 Major depressive disorder, recurrent, moderate: Secondary | ICD-10-CM

## 2022-05-11 DIAGNOSIS — F4310 Post-traumatic stress disorder, unspecified: Secondary | ICD-10-CM

## 2022-05-11 NOTE — Progress Notes (Signed)
BH MD/PA/NP PHP Progress Note  Virtual Visit via Video Note  I connected with Samantha Nguyen on 05/11/22 at  9:00 AM EST by a video enabled telemedicine application and verified that I am speaking with the correct person using two identifiers.  Location: Patient: Home Provider: [facility]   I discussed the limitations of evaluation and management by telemedicine and the availability of in person appointments. The patient expressed understanding and agreed to proceed.   05/11/2022 3:56 PM [address]  MRN:  [id number]  Chief Complaint:  Chief Complaint  Patient presents with   Follow-up   Anxiety   HPI:  Samantha Nguyen is a 37 yr old female who presents via Virtual Video Visit for Follow Up and  Medication Management, she enrolled in the [facility] Program on 04/29/2022.  PPHx is significant for Illness Anxiety Disorder, GAD, MDD, and possible Borderline Personality Disorder, and a remote history of Self Injurious Behavior (Cutting, Burning- last ~2017), and no history of Suicide Attempts or Psychiatric Hospitalizations.    She reports that she is doing fairly good today.  She reports having some anxiety this morning but that she did not have racing heart.  She reports she has not been waking up multiple times a night,  She reports that the few times she does wake up she is able to get right back to sleep.  She reports that she found a youtuber who has anxiety issues and makes videos about it and processing her emotions and she has been using these to help process.  She reports she has been meditating before going to bed and when she wakes up she journals.  She reports she normal has real issues during the weekends without groups but this past weekend she was able to go help a coworker with something and she did not have panic attacks over it.  She reports like she is on the right path.   She reports no SI, HI, or AVH.  She reports her sleep is good.  She reports her appetite is good.  She reports no  other concerns at present.   Visit Diagnosis:    ICD-10-CM   1. Illness anxiety disorder  F45.21     2. MDD (major depressive disorder), recurrent episode, moderate (HCC)  F33.1     3. GAD (generalized anxiety disorder)  F41.1     4. PTSD (post-traumatic stress disorder)  F43.10       Past Psychiatric History: Illness Anxiety Disorder, GAD, MDD, and possible Borderline Personality Disorder, and a remote history of Self Injurious Behavior (Cutting, Burning- last ~2017), and no history of Suicide Attempts or Psychiatric Hospitalizations.    Past Medical History:  Past Medical History:  Diagnosis Date   Anxiety    Depressed    History of kidney stones    Migraine    Renal disorder     Past Surgical History:  Procedure Laterality Date   CESAREAN SECTION     CYSTOSCOPY W/ URETERAL STENT PLACEMENT Right 08/24/2016   Procedure: CYSTOSCOPY WITH RETROGRADE PYELOGRAM/URETERAL STENT PLACEMENT;  Surgeon: Cleon Gustin, MD;  Location: [facility];  Service: Urology;  Laterality: Right;   CYSTOSCOPY WITH RETROGRADE PYELOGRAM, URETEROSCOPY AND STENT PLACEMENT Right 09/09/2016   Procedure: CYSTOSCOPY WITH RETROGRADE PYELOGRAM, URETEROSCOPY AND STENT REPLACEMENT;  Surgeon: Cleon Gustin, MD;  Location: [facility];  Service: Urology;  Laterality: Right;   MANDIBLE SURGERY      Family Psychiatric History: Father- Panic Attacks, Polysubstance Abuse Brother- Substance  Abuse Paternal Cousin- Completed Suicide Maternal Grandmother- Anxiety  Family History:  Family History  Problem Relation Age of Onset   Hypertension Mother    Ulcerative colitis Mother    Alcohol abuse Father    Drug abuse Father    Anxiety disorder Father    Drug abuse Brother    Anxiety disorder Maternal Grandfather     Social History:  Social History   Socioeconomic History   Marital status: Single    Spouse name: Not on file   Number of children: 1   Years of education: Not on file    Highest education level: Some college, no degree  Occupational History   Not on file  Tobacco Use   Smoking status: Former   Smokeless tobacco: Never  Scientific laboratory technician Use: Some days   Substances: CBD  Substance and Sexual Activity   Alcohol use: Yes    Comment: occasionally   Drug use: No   Sexual activity: Not on file  Other Topics Concern   Not on file  Social History Narrative   Not on file   Social Determinants of Health   Financial Resource Strain: Not on file  Food Insecurity: Not on file  Transportation Needs: Not on file  Physical Activity: Not on file  Stress: Not on file  Social Connections: Not on file    Allergies:  Allergies  Allergen Reactions   Sulfa Antibiotics Other (See Comments)    Unknown, happened when she was a child    Metabolic Disorder Labs: No results found for: "HGBA1C", "MPG" No results found for: "PROLACTIN" No results found for: "CHOL", "TRIG", "HDL", "CHOLHDL", "VLDL", "LDLCALC" Lab Results  Component Value Date   TSH 1.220 05/02/2022   TSH 1.006 05/31/2021    Therapeutic Level Labs: No results found for: "LITHIUM" No results found for: "VALPROATE" No results found for: "CBMZ"  Current Medications: Current Outpatient Medications  Medication Sig Dispense Refill   ALPRAZolam (XANAX) 0.5 MG tablet Take 1 tablet (0.5 mg total) by mouth 3 (three) times daily as needed for anxiety. 30 tablet 0   diclofenac (VOLTAREN) 75 MG EC tablet Take 1 tablet (75 mg total) by mouth 2 (two) times daily as needed. Do not take until finished with steroid taper (Patient not taking: Reported on 04/02/2022) 60 tablet 0   fexofenadine (ALLEGRA) 180 MG tablet Take 180 mg by mouth daily as needed for allergies.     fluticasone (FLONASE) 50 MCG/ACT nasal spray Place 1 spray into both nostrils daily. (Patient not taking: Reported on 05/07/2022) 16 g 2   HYDROcodone-acetaminophen (NORCO) 5-325 MG tablet Take 1 tablet by mouth every 4 (four) hours as needed  for moderate pain or severe pain. (Patient not taking: Reported on 04/02/2022) 20 tablet 0   LORazepam (ATIVAN) 0.5 MG tablet Take 0.5 mg by mouth as needed for anxiety.     Multiple Vitamins-Minerals (MULTIVITAMIN ADULT) TABS Take 1 tablet by mouth daily. (Patient not taking: Reported on 04/30/2022)     PARoxetine (PAXIL) 30 MG tablet Take 30 mg by mouth daily.     predniSONE (STERAPRED UNI-PAK 21 TAB) 10 MG (21) TBPK tablet Take as directed (Patient not taking: Reported on 04/02/2022) 21 tablet 0   propranolol (INDERAL) 10 MG tablet Take 10 mg by mouth daily. (Patient not taking: Reported on 05/07/2022)     tamsulosin (FLOMAX) 0.4 MG CAPS capsule 1 q HS to aid stone passage (Patient not taking: Reported on 04/02/2022) 7 capsule 0  No current facility-administered medications for this visit.     Musculoskeletal: Strength & Muscle Tone: within normal limits Gait & Station:  Sitting During Interview Patient leans: N/A  Psychiatric Specialty Exam: Review of Systems  Respiratory:  Negative for shortness of breath.   Cardiovascular:  Negative for chest pain.  Gastrointestinal:  Negative for abdominal pain, constipation, diarrhea, nausea and vomiting.  Neurological:  Negative for dizziness, weakness and headaches.  Psychiatric/Behavioral:  Negative for dysphoric mood, hallucinations, sleep disturbance and suicidal ideas. The patient is nervous/anxious (mild).     Last menstrual period 05/02/2022.There is no height or weight on file to calculate BMI.  General Appearance: Casual and Fairly Groomed  Eye Contact:  Fair  Speech:  Clear and Coherent and Normal Rate  Volume:  Normal  Mood:  Anxious, mild  Affect:  Congruent  Thought Process:  Coherent and Goal Directed  Orientation:  Full (Time, Place, and Person)  Thought Content: WDL and Logical   Suicidal Thoughts:  No  Homicidal Thoughts:  No  Memory:  Immediate;   Good Recent;   Good  Judgement:  Fair  Insight:  Fair  Psychomotor  Activity:  Normal  Concentration:  Concentration: Good and Attention Span: Good  Recall:  Good  Fund of Knowledge: Good  Language: Good  Akathisia:  Negative  Handed:  Right  AIMS (if indicated): not done  Assets:  Communication Skills Desire for Improvement Housing Physical Health Resilience Social Support  ADL's:  Intact  Cognition: WNL  Sleep:  Fair   Screenings: GAD-7    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 Emergency Department at [facility] Counselor from 04/30/2022 in [facility] Counselor from 04/28/2022 in [place] Moderate Risk Error: Question 6 not populated Error: Question 2 not populated        Assessment and Plan:  Crystle "Trinidad Curet" Soria is a 37 yr old female who presents via Virtual Video Visit for Follow Up and  Medication Management, she enrolled in the [facility] Program on 04/29/2022.  PPHx is significant for Illness Anxiety Disorder, GAD, MDD, and possible Borderline Personality Disorder, and a remote history of Self Injurious Behavior (Cutting, Burning- last ~2017), and no history of Suicide Attempts or Psychiatric Hospitalizations.     Trinidad Curet seems to be making progress as she has been less anxious and been using self soothing techniques.  We will not make any medication changes at this time.  Encouraged her to continue her meditation and distraction/self soothing techniques.  We will continue to monitor.   Illness Anxiety Disorder  GAD  MDD, Recurrent, Moderate  PTSD: -Continue Paxil 20 mg daily.  No refills sent at this time. -Continue  Ativan 0.5 mg daily PRN.  No refills sent at this time. -Prescribed Propanolol 10 mg daily but not taking at this time    Collaboration of Care: Collaboration of Care: Other PHP Program  Patient/Guardian was advised Release of Information must be obtained prior to any record release in order to collaborate their care with an outside provider. Patient/Guardian was advised if they have not already  done so to contact the registration department to sign all necessary forms in order for [name] to release information regarding their care.   Consent: Patient/Guardian gives verbal consent for treatment and assignment of benefits for services provided during this visit. Patient/Guardian expressed understanding and agreed to proceed.    Briant Cedar, MD 05/11/2022, 3:56 PM  Follow Up Instructions:    I discussed the assessment and treatment plan with the patient. The patient was provided an opportunity to ask questions and all were answered. The patient agreed with the plan and demonstrated an understanding of the instructions.   The patient was advised to call back or seek an in-person evaluation if the symptoms worsen or if the condition fails to improve as anticipated.  I provided 15 minutes of non-face-to-face time during this encounter.   Briant Cedar, MD

## 2022-05-12 ENCOUNTER — Ambulatory Visit (INDEPENDENT_AMBULATORY_CARE_PROVIDER_SITE_OTHER): Payer: Medicaid Other | Admitting: Licensed Clinical Social Worker

## 2022-05-12 ENCOUNTER — Ambulatory Visit (HOSPITAL_COMMUNITY): Payer: Medicaid Other

## 2022-05-12 DIAGNOSIS — F411 Generalized anxiety disorder: Secondary | ICD-10-CM | POA: Diagnosis not present

## 2022-05-12 DIAGNOSIS — F4521 Hypochondriasis: Secondary | ICD-10-CM

## 2022-05-12 DIAGNOSIS — F331 Major depressive disorder, recurrent, moderate: Secondary | ICD-10-CM

## 2022-05-13 ENCOUNTER — Ambulatory Visit: Payer: Self-pay

## 2022-05-13 ENCOUNTER — Ambulatory Visit (HOSPITAL_COMMUNITY): Payer: Medicaid Other

## 2022-05-13 ENCOUNTER — Encounter (HOSPITAL_COMMUNITY): Payer: Self-pay

## 2022-05-14 ENCOUNTER — Ambulatory Visit (HOSPITAL_COMMUNITY): Payer: Medicaid Other

## 2022-05-14 ENCOUNTER — Ambulatory Visit (INDEPENDENT_AMBULATORY_CARE_PROVIDER_SITE_OTHER): Payer: Medicaid Other | Admitting: Licensed Clinical Social Worker

## 2022-05-14 DIAGNOSIS — F411 Generalized anxiety disorder: Secondary | ICD-10-CM

## 2022-05-14 DIAGNOSIS — F4521 Hypochondriasis: Secondary | ICD-10-CM

## 2022-05-14 DIAGNOSIS — F331 Major depressive disorder, recurrent, moderate: Secondary | ICD-10-CM

## 2022-05-14 NOTE — Psych (Signed)
Virtual Visit via Video Note  I connected with Samantha Nguyen on 05/14/22 at  9:00 AM EST by a video enabled telemedicine application and verified that I am speaking with the correct person using two identifiers.  Location: Patient: patient home Provider: clinical home office   I discussed the limitations of evaluation and management by telemedicine and the availability of in person appointments. The patient expressed understanding and agreed to proceed.  I discussed the assessment and treatment plan with the patient. The patient was provided an opportunity to ask questions and all were answered. The patient agreed with the plan and demonstrated an understanding of the instructions.   The patient was advised to call back or seek an in-person evaluation if the symptoms worsen or if the condition fails to improve as anticipated.  Pt was provided 240 minutes of non-face-to-face time during this encounter.   Lorin Glass, LCSW   [facility] PHP THERAPIST PROGRESS NOTE  Mekka Blumenberg [id number]  Session Time: 9:00 -10:00  Participation Level: Active  Behavioral Response: CasualAlertAnxious  Type of Therapy: Group Therapy  Treatment Goals addressed: Coping  Progress Towards Goals: Progressing  Interventions: CBT, DBT, Supportive, and Reframing  Summary: Samantha Nguyen is a 37 y.o. female who presents with anxiety.  Clinician led check-in regarding current stressors and situation, and review of patient completed daily inventory. Clinician utilized active listening and empathetic response and validated patient emotions. Clinician facilitated processing group on pertinent issues.?    Therapist Response: Patient arrived within time allowed. Patient rates her mood at a 7.5 on a scale of 1-10 with 10 being best. Pt states she feels "not bad anxious." Pt states she slept "a lot" of hours and ate 2x. Pt reports she slept most of yesterday and through most of last night. Pt reports feeling notably  different after sleeping well. Pt reports her anxiety is "there in the background" and she feels able to manage it. Pt is notably not fidgety or tearful in session as previously this week. Pt shares she was accepted to the arts education program and she is feeling more optimistic about work knowing the future will align more with her passions.  Patient able to process. Patient engaged in discussion.             Session Time: 10:00 am - 11:00 am   Participation Level: Active   Behavioral Response: CasualAlertDepressed   Type of Therapy: Group Therapy   Treatment Goals addressed: Coping   Progress Towards Goals: Progressing   Interventions: CBT, DBT, Solution Focused, Strength-based, Supportive, and Reframing   Therapist Response:  Cln led discussion on protecting our energy. Cln brought in topics of boundaries and self-esteem. Group discussed what drains them and why they engage in those activities. Cln encouraged pt's to consider what will protect their energy when making decisions.    Therapist Response: Pt engaged in discussion and reports gaining insight.           Session Time: 11:00 -12:00   Participation Level: Active   Behavioral Response: CasualAlertDepressed   Type of Therapy: Group Therapy   Treatment Goals addressed: Coping   Progress Towards Goals: Progressing   Interventions: CBT, DBT, Solution Focused, Strength-based, Supportive, and Reframing   Summary: Cln led discussion on thought fallacies, utilizing handout "Eleven Beliefs that Make Life Better." Group reviewed handout and discussed the struggles with believing these beliefs and how to reinforce them in our lives.    Therapist Response: Pt engaged in discussion and identifies struggling most  with recognizing people don't have to like her.            Session Time: 12:00 -1:00   Participation Level: Active   Behavioral Response: CasualAlertDepressed   Type of Therapy: Group therapy,  Occupational Therapy   Treatment Goals addressed: Coping   Progress Towards Goals: Progressing   Interventions: Supportive; Psychoeducation   Summary: 12:00 - 12:50: Occupational Therapy group led by cln E. Hollan. 12:50 - 1:00 Clinician assessed for immediate needs, medication compliance and efficacy, and safety concerns.   Therapist Response: 12:00 - 12:50: Pt participated 12:50 - 1:00 pm: At check-out, patient reports no immediate concerns. Patient demonstrates progress as evidenced by continued engagement and responsiveness to treatment. Patient denies SI/HI/self-harm thoughts at the end of group.    Suicidal/Homicidal: Nowithout intent/plan   Plan: Pt will continue in PHP while working to decrease anxiety symptoms, increase emotion regulation, and increase ability to manage symptoms in a healthy manner.   Collaboration of Care: Medication Management AEB A Pashayan  Patient/Guardian was advised Release of Information must be obtained prior to any record release in order to collaborate their care with an outside provider. Patient/Guardian was advised if they have not already done so to contact the registration department to sign all necessary forms in order for [name] to release information regarding their care.   Consent: Patient/Guardian gives verbal consent for treatment and assignment of benefits for services provided during this visit. Patient/Guardian expressed understanding and agreed to proceed.   Diagnosis: GAD (generalized anxiety disorder) [F41.1]    1. GAD (generalized anxiety disorder)   2. Illness anxiety disorder   3. MDD (major depressive disorder), recurrent episode, moderate ([place])       Lorin Glass, LCSW 05/14/2022

## 2022-05-14 NOTE — Psych (Signed)
Virtual Visit via Video Note  I connected with Samantha Nguyen on 05/11/22 at  9:00 AM EST by a video enabled telemedicine application and verified that I am speaking with the correct person using two identifiers.  Location: Patient: patient home Provider: clinical home office   I discussed the limitations of evaluation and management by telemedicine and the availability of in person appointments. The patient expressed understanding and agreed to proceed.  I discussed the assessment and treatment plan with the patient. The patient was provided an opportunity to ask questions and all were answered. The patient agreed with the plan and demonstrated an understanding of the instructions.   The patient was advised to call back or seek an in-person evaluation if the symptoms worsen or if the condition fails to improve as anticipated.  Pt was provided 240 minutes of non-face-to-face time during this encounter.   Lorin Glass, LCSW   [facility] PHP THERAPIST PROGRESS NOTE  Markel Baza [id number]  Session Time: 9:00 -10:00  Participation Level: Active  Behavioral Response: CasualAlertAnxious  Type of Therapy: Group Therapy  Treatment Goals addressed: Coping  Progress Towards Goals: Progressing  Interventions: CBT, DBT, Supportive, and Reframing  Summary: Samantha Nguyen is a 37 y.o. female who presents with anxiety.  Clinician led check-in regarding current stressors and situation, and review of patient completed daily inventory. Clinician utilized active listening and empathetic response and validated patient emotions. Clinician facilitated processing group on pertinent issues.?    Therapist Response: Patient arrived within time allowed. Patient rates her mood at a 7 on a scale of 1-10 with 10 being best. Pt states she feels "mild anxiety." Pt states she slept 9 broken hours and ate 1x. Pt presents as brighter and more emotionally stable. Pt reports the afternoon went better for her after  group. She shares attending a counseling session and going by work to help a Social worker. Pt reports it was the first time she has been to work since walking out and was able to manage her anxieties while there. Pt shares she found a YouTuber who she connected with regarding her anxiety journey and watched videos that she found meaningful and helpful. Pt reports applying strategies from videos of changing her phone's wallpaper to a helpful quote, sitting outside, and applying to transfer to a different master's program. Pt states she hasn't checked her heart rate this morning which she takes as progress. Patient able to process. Patient engaged in discussion.             Session Time: 10:00 am - 11:00 am   Participation Level: Active   Behavioral Response: CasualAlertDepressed   Type of Therapy: Group Therapy   Treatment Goals addressed: Coping   Progress Towards Goals: Progressing   Interventions: CBT, DBT, Solution Focused, Strength-based, Supportive, and Reframing   Therapist Response: Cln led discussion on staying present. Cln highlighted CBT distorted thoughts of catastrophizing and fortune telling and encouraged pt's to think about today and tomorrow and not past that to address those distortions. Group members shared struggles they have with looking into the future.    Therapist Response: Pt is able to process and discuss how to apply strategies discussed.            Session Time: 11:00 -12:00   Participation Level: Active   Behavioral Response: CasualAlertDepressed   Type of Therapy: Group Therapy   Treatment Goals addressed: Coping   Progress Towards Goals: Progressing   Interventions: CBT, DBT, Solution Focused, Strength-based, Supportive, and Reframing  Summary: Cln led discussion on feelings and the role they play for our lives. Cln provided DBT wise mind framework to discuss facts about feelings and how to utilize these facts to help contextualize the feelings  which we experience.     Therapist Response: Pt engaged in discussion and reports understanding.             Session Time: 12:00 -1:00   Participation Level: Active   Behavioral Response: CasualAlertDepressed   Type of Therapy: Group therapy, Occupational Therapy   Treatment Goals addressed: Coping   Progress Towards Goals: Progressing   Interventions: Supportive; Psychoeducation   Summary: 12:00 - 12:50: Occupational Therapy group led by cln E. Hollan. 12:50 - 1:00 Clinician assessed for immediate needs, medication compliance and efficacy, and safety concerns.   Therapist Response: 12:00 - 12:50: Pt participated 12:50 - 1:00 pm: At check-out, patient reports no immediate concerns. Patient demonstrates progress as evidenced by continued engagement and responsiveness to treatment. Patient denies SI/HI/self-harm thoughts at the end of group.    Suicidal/Homicidal: Nowithout intent/plan   Plan: Pt will continue in PHP while working to decrease anxiety symptoms, increase emotion regulation, and increase ability to manage symptoms in a healthy manner.   Collaboration of Care: Medication Management AEB A Pashayan  Patient/Guardian was advised Release of Information must be obtained prior to any record release in order to collaborate their care with an outside provider. Patient/Guardian was advised if they have not already done so to contact the registration department to sign all necessary forms in order for [name] to release information regarding their care.   Consent: Patient/Guardian gives verbal consent for treatment and assignment of benefits for services provided during this visit. Patient/Guardian expressed understanding and agreed to proceed.   Diagnosis: Illness anxiety disorder [F45.21]    1. Illness anxiety disorder   2. MDD (major depressive disorder), recurrent episode, moderate ([place])   3. GAD (generalized anxiety disorder)   4. PTSD (post-traumatic stress disorder)        Lorin Glass, LCSW 05/14/2022

## 2022-05-14 NOTE — Psych (Signed)
Virtual Visit via Video Note  I connected with Samantha Nguyen on 05/12/22 at  9:00 AM EST by Samantha video enabled telemedicine application and verified that I am speaking with the correct person using two identifiers.  Location: Patient: patient home Provider: clinical home office   I discussed the limitations of evaluation and management by telemedicine and the availability of in person appointments. The patient expressed understanding and agreed to proceed.  I discussed the assessment and treatment plan with the patient. The patient was provided an opportunity to ask questions and all were answered. The patient agreed with the plan and demonstrated an understanding of the instructions.   The patient was advised to call back or seek an in-person evaluation if the symptoms worsen or if the condition fails to improve as anticipated.  Pt was provided 240 minutes of non-face-to-face time during this encounter.   Samantha Glass, LCSW   [facility] PHP THERAPIST PROGRESS NOTE  Samantha Nguyen [id number]  Session Time: 9:00 -10:00  Participation Level: Active  Behavioral Response: CasualAlertAnxious  Type of Therapy: Group Therapy  Treatment Goals addressed: Coping  Progress Towards Goals: Progressing  Interventions: CBT, DBT, Supportive, and Reframing  Summary: Samantha Nguyen is Samantha 37 y.o. female who presents with anxiety.  Clinician led check-in regarding current stressors and situation, and review of patient completed daily inventory. Clinician utilized active listening and empathetic response and validated patient emotions. Clinician facilitated processing group on pertinent issues.?    Therapist Response: Patient arrived within time allowed. Patient rates her mood at Samantha 5 on Samantha scale of 1-10 with 10 being best. Pt states she feels "not good." Pt states she slept 3 broken hours and ate 2x. Pt reports yesterday went well however she did not sleep well last night and spiraled easily. Pt reports  anxieties rising about work, money, and her wedding. Cln struggled to challenge her thoughts on her own, however was responsive to cln and group challenges. Pt struggles with mood dependent thinking.Pt identifies hopelessness.  Patient able to process. Patient engaged in discussion.             Session Time: 10:00 am - 11:00 am   Participation Level: Active   Behavioral Response: CasualAlertDepressed   Type of Therapy: Group Therapy   Treatment Goals addressed: Coping   Progress Towards Goals: Progressing   Interventions: CBT, DBT, Solution Focused, Strength-based, Supportive, and Reframing   Therapist Response: Cln led processing group for pt's current struggles. Group members shared stressors and provided support and feedback. Cln brought in topics of boundaries, healthy relationships, and unhealthy thought processes to inform discussion.    Therapist Response: Pt able to process and provide support to group.          Session Time: 11:00 -12:00   Participation Level: Active   Behavioral Response: CasualAlertDepressed   Type of Therapy: Group Therapy, Spiritual Care   Treatment Goals addressed: Coping   Progress Towards Goals: Progressing   Interventions: Supportive, Education   Summary:  Samantha Nguyen, Chaplain, led group.   Therapist Response: Pt participated           Session Time: 12:00 -1:00   Participation Level: Active   Behavioral Response: CasualAlertDepressed   Type of Therapy: Group therapy, Occupational Therapy   Treatment Goals addressed: Coping   Progress Towards Goals: Progressing   Interventions: Supportive; Psychoeducation   Summary: 12:00 - 12:50: Occupational Therapy group led by cln Samantha Nguyen. 12:50 - 1:00 Clinician assessed for immediate needs, medication compliance and  efficacy, and safety concerns.   Therapist Response: 12:00 - 12:50: Pt participated 12:50 - 1:00 pm: At check-out, patient reports no immediate concerns. Patient  demonstrates progress as evidenced by continued engagement and responsiveness to treatment. Patient denies SI/HI/self-harm thoughts at the end of group.    Suicidal/Homicidal: Nowithout intent/plan   Plan: Pt will continue in PHP while working to decrease anxiety symptoms, increase emotion regulation, and increase ability to manage symptoms in Samantha healthy manner.   Collaboration of Care: Medication Management AEB Samantha Nguyen  Patient/Guardian was advised Release of Information must be obtained prior to any record release in order to collaborate their care with an outside provider. Patient/Guardian was advised if they have not already done so to contact the registration department to sign all necessary forms in order for [name] to release information regarding their care.   Consent: Patient/Guardian gives verbal consent for treatment and assignment of benefits for services provided during this visit. Patient/Guardian expressed understanding and agreed to proceed.   Diagnosis: GAD (generalized anxiety disorder) [F41.1]    1. GAD (generalized anxiety disorder)   2. Illness anxiety disorder   3. MDD (major depressive disorder), recurrent episode, moderate ([place])       Samantha Glass, LCSW 05/14/2022

## 2022-05-14 NOTE — Psych (Signed)
Virtual Visit via Video Note  I connected with Samantha Nguyen on 05/10/22 at  9:00 AM EST by a video enabled telemedicine application and verified that I am speaking with the correct person using two identifiers.  Location: Patient: patient home Provider: clinical home office   I discussed the limitations of evaluation and management by telemedicine and the availability of in person appointments. The patient expressed understanding and agreed to proceed.  I discussed the assessment and treatment plan with the patient. The patient was provided an opportunity to ask questions and all were answered. The patient agreed with the plan and demonstrated an understanding of the instructions.   The patient was advised to call back or seek an in-person evaluation if the symptoms worsen or if the condition fails to improve as anticipated.  Pt was provided 240 minutes of non-face-to-face time during this encounter.   Lorin Glass, LCSW   [facility] PHP THERAPIST PROGRESS NOTE  Samantha Nguyen [id number]  Session Time: 9:00 -10:00  Participation Level: Active  Behavioral Response: CasualAlertAnxious  Type of Therapy: Group Therapy  Treatment Goals addressed: Coping  Progress Towards Goals: Progressing  Interventions: CBT, DBT, Supportive, and Reframing  Summary: Samantha Nguyen is a 37 y.o. female who presents with anxiety.  Clinician led check-in regarding current stressors and situation, and review of patient completed daily inventory. Clinician utilized active listening and empathetic response and validated patient emotions. Clinician facilitated processing group on pertinent issues.?    Therapist Response: Patient arrived within time allowed. Patient rates her mood at a 2.5 on a scale of 1-10 with 10 being best. Pt states she feels "okay." Pt states she slept 5 broken hours and ate 2x. Pt is tearful and escalated in group. Pt reports she took a 1/2 of a Xanax this morning and "feels like a  loser" because she needed to use her PRN medication. Cln coached on the purposes of PRN medication and attempted to de-stigmatize the need for it.  Pt seems to have decompensated notably over the weekend and needs increased CBT coaching and reassurance which is slow to take hold, marking a notable difference from the end of last week. Pt denies any triggers or issues over the weekend. Pt identifies hopelessness. Patient able to process. Patient engaged in discussion.             Session Time: 10:00 am - 11:00 am   Participation Level: Active   Behavioral Response: CasualAlertDepressed   Type of Therapy: Group Therapy   Treatment Goals addressed: Coping   Progress Towards Goals: Progressing   Interventions: CBT, DBT, Solution Focused, Strength-based, Supportive, and Reframing   Therapist Response: Cln led discussion on "work arounds" or finding intermediate solutions while we are in the process of working on a final solution. Group members shared situations in which they are struggling with an issue that they are unable to fix quickly. Cln encouraged pt's to consider ways to make things "doable" and not let "perfect" stand in the way.    Therapist Response: Pt engaged in discussion and is able to connect with topic.           Session Time: 11:00 -12:00   Participation Level: Active   Behavioral Response: CasualAlertDepressed   Type of Therapy: Group Therapy   Treatment Goals addressed: Coping   Progress Towards Goals: Progressing   Interventions: CBT, DBT, Solution Focused, Strength-based, Supportive, and Reframing   Summary: Cln continued discussion on topic of boundaries. Group reviewed previous aspects of boundaries discussed.  Cln utilized handout "Tips for [employer]" and group members discussed how to apply the tips. Cln shaped conversation and cued for healthy boundary characteristics.    Therapist Response:  Pt engaged in discussion and is able to process  ways to increase their healthy boundaries.             Session Time: 12:00 -1:00   Participation Level: Active   Behavioral Response: CasualAlertDepressed   Type of Therapy: Group therapy, Occupational Therapy   Treatment Goals addressed: Coping   Progress Towards Goals: Progressing   Interventions: Supportive; Psychoeducation   Summary: 12:00 - 12:50: Occupational Therapy group led by cln E. Hollan. 12:50 - 1:00 Clinician assessed for immediate needs, medication compliance and efficacy, and safety concerns.   Therapist Response: 12:00 - 12:50: Pt participated 12:50 - 1:00 pm: At check-out, patient reports no immediate concerns. Patient demonstrates progress as evidenced by continued engagement and responsiveness to treatment. Patient denies SI/HI/self-harm thoughts at the end of group.    Suicidal/Homicidal: Nowithout intent/plan   Plan: Pt will continue in PHP while working to decrease anxiety symptoms, increase emotion regulation, and increase ability to manage symptoms in a healthy manner.   Collaboration of Care: Medication Management AEB A Pashayan  Patient/Guardian was advised Release of Information must be obtained prior to any record release in order to collaborate their care with an outside provider. Patient/Guardian was advised if they have not already done so to contact the registration department to sign all necessary forms in order for [name] to release information regarding their care.   Consent: Patient/Guardian gives verbal consent for treatment and assignment of benefits for services provided during this visit. Patient/Guardian expressed understanding and agreed to proceed.   Diagnosis: GAD (generalized anxiety disorder) [F41.1]    1. GAD (generalized anxiety disorder)   2. Illness anxiety disorder   3. MDD (major depressive disorder), recurrent episode, moderate ([place])       Lorin Glass, LCSW 05/14/2022

## 2022-05-17 ENCOUNTER — Telehealth: Payer: Self-pay | Admitting: Internal Medicine

## 2022-05-17 ENCOUNTER — Ambulatory Visit (HOSPITAL_COMMUNITY): Payer: Medicaid Other

## 2022-05-17 ENCOUNTER — Encounter (HOSPITAL_COMMUNITY): Payer: Self-pay

## 2022-05-17 ENCOUNTER — Telehealth (HOSPITAL_COMMUNITY): Payer: Self-pay | Admitting: Professional

## 2022-05-17 NOTE — Telephone Encounter (Signed)
Significant other (SO) answered call and patient states SO can report symptoms, etc for patient.  SO reports patient experiencing SOB, can barely staying awake, bilateral edema, abdominal pain, n/v    Patients has asthma.  Patient reports she doesn't have any refills of all medication.     Onset: one week.    Denies: loose stools, fever, chest pain.    Recommended go to ED now.  Asked if needs writer to call 911. Patient declines assistance and states SO can take her now.

## 2022-05-17 NOTE — Telephone Encounter (Signed)
Copied from [place] [id number]. Topic: Access to Care - Speak to Provider/Office Staff  >> May 17, 2022  3:51 PM Jenna [name] wrote:  Jenna [name], Patient, calling to report that the patient is experiencing laryngitis, fatigue, shortness of breath, feet swelling and lower abdominal discomfort.  These symptoms have been going on for one week    Patient requesting same day appointment? unsure  Patient requesting call back? yes    Phone number confirmed at [phone]    Writer called back line and spoke with Freida Busman.  Back line call dropped.  Writer called back,  to back line and spoke and was unable to reach back line(writer waited more than allotted time)    Patient requested a call back.

## 2022-05-18 ENCOUNTER — Ambulatory Visit (INDEPENDENT_AMBULATORY_CARE_PROVIDER_SITE_OTHER): Payer: Medicaid Other | Admitting: Licensed Clinical Social Worker

## 2022-05-18 ENCOUNTER — Ambulatory Visit (HOSPITAL_COMMUNITY): Payer: Medicaid Other

## 2022-05-18 DIAGNOSIS — F4521 Hypochondriasis: Secondary | ICD-10-CM | POA: Diagnosis not present

## 2022-05-18 DIAGNOSIS — F411 Generalized anxiety disorder: Secondary | ICD-10-CM

## 2022-05-18 MED ORDER — PAROXETINE HCL 20 MG PO TABS: 20.0000 mg | ORAL_TABLET | Freq: Every day | ORAL | 0 refills | Status: DC

## 2022-05-18 NOTE — Progress Notes (Signed)
[facility] Partial Hospitalization Program Psych Discharge Summary  Laria Zammit [id number]  Admission date: 04/29/2022 Discharge date: 05/19/2022  Reason for admission: Illness Anxiety Disorder, GAD, MDD   Progress in Program Toward Treatment Goals: Progressing  Progress (rationale):  [place]ia "Trinidad Curet" [place] is a 37 yr old female who presents via Virtual Video Visit for discharge and for Medication Management, she enrolled in the [facility] on 04/29/2022.  PPHx is significant for Illness Anxiety Disorder, GAD, MDD, and possible Borderline Personality Disorder, and a remote history of Self Injurious Behavior (Cutting, Burning- last ~2017), and no history of Suicide Attempts or Psychiatric Hospitalizations.   Patient endorses that she has benefited from the program. Patient has noticed less physical symptoms of anxiety and is not having panic attacks in the early morning. Patient reports improved sleep and eating is stable. Patient reports that her mood is 7-8-9/10 with 10 "being the best day of my life."Patient denies SI, denies recent passive SI, HI, and AVH. Patient has grown from heavy reliance on the group, with the group being on of her main motivators, to feeling motivated to participate in self-care and does not heavily rely on group for stability in life. Patient report she is able to make her own schedule.   Patient endorses that she is able to use coping skills to distract herself from her overwhelming anxiety (like getting rest, TV, or playing on phone). Patient reports that she is no longer having somatization of symptoms. Patient reports that she is starting work next week, where she is a [employer], but Spring break is coming up. 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    Illness Anxiety Disorder  GAD  MDD, Recurrent, Moderate  PTSD:  - Continue Paxil  '20mg'$  daily, prescribed refill today - Will not prescribe refill  of Ativan 0.'5mg'$  PRN. Patient has not been on Ativan chronically and does not take daily, has low risk for withdrawal. Patient educated that about adverse effects of chronic use of Ativan, to which patient   Psychiatric Specialty Exam:   Review of Systems  Psychiatric/Behavioral:  Negative for dysphoric mood, hallucinations, sleep disturbance and suicidal ideas.     Last menstrual period 05/02/2022.There is no height or weight on file to calculate BMI.  General Appearance: Casual  Eye Contact:  Good  Speech:  Clear and Coherent  Volume:  Normal  Mood:  Euthymic  Affect:  Appropriate  Thought Process:  Coherent  Orientation:  Full (Time, Place, and Person)  Thought Content:  Logical  Suicidal Thoughts:  No  Homicidal Thoughts:  No  Memory:  Immediate;   Good  Judgement:  Fair  Insight:  Fair  Psychomotor Activity:  Normal  Concentration:  Concentration: Good  Recall:  NA  Fund of Knowledge:  Good  Language:  Good  Akathisia:  No  Handed:    AIMS (if indicated):     Assets:  Communication Skills Desire for Improvement Housing Resilience Talents/Skills Transportation Vocational/Educational  ADL's:  Intact  Cognition:  WNL  Sleep:   Good     Discharge Plan: Referral to Psychiatrist, will f/u with Dr. Kai Levins. Looking for a Trauma therapist, but would like to have a referral for therapist at [facility].  Collaboration of Care: Other provider involved in patient's care AEB therapist  Patient/Guardian was advised Release of Information must be obtained prior  to any record release in order to collaborate their care with an outside provider. Patient/Guardian was advised if they have not already done so to contact the registration department to sign all necessary forms in order for [name] to release information regarding their care.   Consent: Patient/Guardian gives verbal consent for treatment and assignment of benefits  for services provided during this visit. Patient/Guardian expressed understanding and agreed to proceed.    Damita Dunnings, MD PGY-3 05/18/2022

## 2022-05-18 NOTE — Telephone Encounter (Signed)
Spoke to patient who is still symptomatic.  Patient offered an appointment at 4:30 on Wednesday, 3/6 with Dr. Gretta Cool.  Patient requested transportation but is Medicaid ineligible.  Writer notified Social work, who will contact patient.

## 2022-05-19 ENCOUNTER — Ambulatory Visit: Payer: Self-pay | Admitting: Student in an Organized Health Care Education/Training Program

## 2022-05-19 ENCOUNTER — Telehealth: Payer: Self-pay

## 2022-05-19 ENCOUNTER — Other Ambulatory Visit: Payer: Self-pay

## 2022-05-19 ENCOUNTER — Ambulatory Visit (HOSPITAL_COMMUNITY): Payer: Medicaid Other

## 2022-05-19 VITALS — BP 200/100 | HR 92 | Temp 98.4°F | Wt 355.0 lb

## 2022-05-19 DIAGNOSIS — R109 Unspecified abdominal pain: Secondary | ICD-10-CM

## 2022-05-19 DIAGNOSIS — Z5941 Food insecurity: Secondary | ICD-10-CM

## 2022-05-19 DIAGNOSIS — Z113 Encounter for screening for infections with a predominantly sexual mode of transmission: Secondary | ICD-10-CM

## 2022-05-19 DIAGNOSIS — I10 Essential (primary) hypertension: Secondary | ICD-10-CM

## 2022-05-19 DIAGNOSIS — N920 Excessive and frequent menstruation with regular cycle: Secondary | ICD-10-CM

## 2022-05-19 DIAGNOSIS — J4550 Severe persistent asthma, uncomplicated: Secondary | ICD-10-CM

## 2022-05-19 MED ORDER — CARVEDILOL 6.25 MG PO TABS *I*
12.5000 mg | ORAL_TABLET | Freq: Two times a day (BID) | ORAL | 1 refills | Status: DC
Start: 2022-05-19 — End: 2022-10-25
  Filled 2022-05-19: qty 360, 90d supply, fill #0
  Filled 2022-09-04: qty 120, 30d supply, fill #0

## 2022-05-19 MED ORDER — AMLODIPINE BESYLATE 5 MG PO TABS *I*
10.0000 mg | ORAL_TABLET | Freq: Every day | ORAL | 1 refills | Status: DC
Start: 2022-05-19 — End: 2022-08-24
  Filled 2022-05-19: qty 90, 45d supply, fill #0

## 2022-05-19 MED ORDER — BLOOD PRESSURE MONITOR DEVICE *A*
0 refills | Status: AC
Start: 2022-05-19 — End: ?
  Filled 2022-05-19: qty 1, 30d supply, fill #0

## 2022-05-19 MED ORDER — ALBUTEROL SULFATE HFA 108 (90 BASE) MCG/ACT IN AERS *I*
1.0000 | INHALATION_SPRAY | Freq: Four times a day (QID) | RESPIRATORY_TRACT | 3 refills | Status: DC | PRN
Start: 2022-05-19 — End: 2022-10-25
  Filled 2022-05-19: qty 18, 25d supply, fill #0
  Filled 2022-09-04: qty 8.5, 25d supply, fill #0

## 2022-05-19 MED ORDER — BUDESONIDE-FORMOTEROL FUMARATE 160-4.5 MCG/ACT IN AERO *I*
2.0000 | INHALATION_SPRAY | Freq: Two times a day (BID) | RESPIRATORY_TRACT | 5 refills | Status: DC
Start: 2022-05-19 — End: 2022-10-25
  Filled 2022-05-19 – 2022-09-04 (×2): qty 10.2, 30d supply, fill #0

## 2022-05-19 NOTE — Telephone Encounter (Signed)
Denique Eidem  [id number] [place] [address]     REFERRAL FROM:   [facility]:     PRESENTING PROBLEM:                 Food Insecurity & Insurance    ADDITIONAL INFORMATION:  Patient reports that she is in need of an emergency food voucher. Patient asked how to apply for [employer].    ACTION TAKEN:            Probation officer submitted an emergency food voucher for 5+ family members. Writer informed patient that after her appointment she can head to patient discharge to pick up her bags of food. Writer gave patient some crackers and apple juice as patient mentioned she was very hungry. Writer gave patient Elrosa phone number of [phone] and informed her that they are open Monday-Friday 8AM-8PM and Saturdays 9AM-1PM. Patient thanked Probation officer.   Patient asked to speak with Health Home Case Manager Pearl. Writer informed patient that Alease Medina was not currently in clinic, however Corinne would give patient a call. Patient verbalized understanding and thanked Probation officer.

## 2022-05-19 NOTE — Progress Notes (Addendum)
[facility] Follow-up Note     Reason For Visit:   Follow-up      Subjective:   Jenna [name] is a 37 y.o. year old female with history of hypertension, iron deficiency anemia, menorrhagia  presenting today to [facility] for follow-up    She reports worsening shortness of breath and intermittent cough and wheezing that started about one week ago. She can hardly go up to 3 or 4 steps before needing to rest. She also can not lay flat without becoming shortness of breath. She an out of her inhalers several weeks ago and has not been taking her amlodipine. In addition, she been excessively tired and feeling asleep very easily. In addition, her left leg feels swollen.  Has not used cocaine in several months.Never had sleep study for OSA. She has reports diffuse abdominal pain and "fishy" vaginal odor but no discharge. She has been only sexually active with her husband.    She reports a history of heavy menstrual bleeding and anemia. Her last menses ended 2 weeks ago, it lasted for 2 weeks and was very heavy. She reports this is typical      Medications:     Current Outpatient Medications   Medication Sig    blood pressure monitor Use as directed    albuterol HFA (PROVENTIL, VENTOLIN, PROAIR HFA) 108 (90 Base) MCG/ACT inhaler Inhale 1-2 puffs into the lungs every 6 hours as needed for Wheezing.  Shake well before each use.    budesonide-formoterol (SYMBICORT) 160-4.5 MCG/ACT inhaler Inhale 2 puffs by mouth into the lungs 2 times daily. Shake well before each use.    amLODIPine (NORVASC) 5 mg tablet Take 2 tablets (10 mg total) by mouth daily    carvedilol (COREG) 6.25 mg tablet Take 2 tablets (12.5 mg total) by mouth 2 times daily (with meals)    acetaminophen (TYLENOL) 500 mg tablet Take 2 tablets (1,000 mg total) by mouth 3 times daily as needed    doxycycline hyclate (VIBRA-TABS) 100 mg tablet Take 1 tablet (100 mg total) by mouth every 12 hours  for Hidradenitis Suppurativa     guaiFENesin-dextromethorphan (ROBITUSSIN DM) 100-10 mg/77mL syrup Take 5 mLs by mouth 3 times daily as needed for Cough    Meds & Orders Exist in Blood Admin Plan     nicotine (NICODERM CQ) 7 MG/24HR patch Apply 1 patch onto the skin daily (Patient not taking: Reported on 11/21/2020)       Physical Exam:     Vitals:    05/19/22 1607 05/19/22 1630   BP: (!) 214/102 (!) 200/100   BP Location:  Left arm   Patient Position:  Sitting   Cuff Size:  large adult   Pulse: 92    Temp: 36.9 C (98.4 F)    SpO2: 100%    Weight: (!) 161 kg (355 lb)      Wt Readings from Last 3 Encounters:   05/19/22 (!) 161 kg (355 lb)   09/23/21 (!) 152.9 kg (337 lb)   07/30/21 (!) 152.9 kg (337 lb)     BP Readings from Last 3 Encounters:   05/19/22 (!) 200/100   09/23/21 155/69   09/17/21 166/78       General: In no apparent distress.  HEENT: MMM  Pulmonary: CTA B/L. No wheezes, rhonchi, or rales.  Cardiovascular: No JVD, RRR. No murmurs, rubs, or gallops.   Abdominal: Soft. Non-distended. Non-tender.  Extremities: No swelling in both lower extremities, but difficult to  assess due to body habitus  Neuro: A&O to conversation, face symmetric, speech/language WNL, moving all extremities  Skin: No rash on legs    EKG: NSR, left ventricular hypertrophy      Assessment and Plan:     Patient presents with an array of concerning symptoms including severe dyspnea on exertion, orthopnea, intermittent wheezing, diffuse abdominal pain, and heaving menses. She was found to be severely hypertensive to 200/100 but other vitals and EKG were reassuringly. While she reported left swelling, she was without edema on exam. Her lungs were also had good air movement and no wheezes. We discussed presenting to the emergency room for expedited work-up and management of life threatening conditions including hypertensive emergency and pulmonary embolism but patient adamantly declined.     HTN  Dyspnea on exertion and orthopnea  Day time somnolence  - refill amlodipine 10  mg  - start coreg 12.5 mg BID  - refer to sleep medicine  - close follow-up in clinic next week    Asthma  - good air movement and no wheezes on exam   - refill  symibocrt BID and albuterol PRN; last used a few ago    Abdominal pain  - check CMP, lipase, amylase, and gonorrhoeae/chlamydia/trich NAAT vaginal swab    Hx of iron deficiency  Heavy menses  - check CBC and iron studies  - refer to Ob/Gyn    Follow-up: 1 week    Assessment and plan discussed with Dr. Remi Deter, attending physician.    Max Sane, MD  [facility] Internal Medicine PGY3  05/20/2022

## 2022-05-19 NOTE — Progress Notes (Signed)
[facility] SOCIAL WORK  PHARMACY FORM     Today's date:  May 19, 2022    Patient Name: Jenna [name]      Medical Record #: [id number]   DOB: 01/15/1986  Patient's Address: [address] [place]              Social Worker: Wonda Cheng, [place]       Date of Service: May 19, 2022       Funding Source: [place]  Outpatient pharmacy called [facility] and asked for a voucher. Pt insurance is not active right now  ___________________________________________________________________    Pharmacy Information:  Date/time sent: May 19, 2022     Time needed: asap    Patient Location: Outpatient    Medication Pick-up Preference: Patient will pick up at the pharmacy    Pharmacy Contact: [place] work signature: Wonda Cheng, [place] (if indicated):  Date:  (supervisor signature not required for Medicaid pending)    Wonda Cheng, [place]  Emergency Department Social Worker  [phone]

## 2022-05-19 NOTE — Patient Instructions (Signed)
-   get blood work done today    - pick up your meds at the pharmacy. Take coreg twice per day in addition to amlodipine once per day.     - you will be called to schedule with the gyn and sleep medicine doctors.

## 2022-05-19 NOTE — Telephone Encounter (Signed)
Jenna [name]  [id number]  [place] [address]     REFERRAL FROM:   [facility]:     PRESENTING PROBLEM:                 Transportation    ADDITIONAL INFORMATION:  Patient does not have medicaid and is not eligible to utilize [employer] transportation. Patient is aware that ride to appointment is for only for her.    ACTION TAKEN:  TRANSPORTATION   ARRANGEMENTS:  Appointment: 3/6  @ 4:30PM  Pick-up time @ 3:30PM  Pick-up location  [place] [address]        Cab Company is   [employer] # [phone]  *Transportation is round trip  [place]

## 2022-05-20 ENCOUNTER — Ambulatory Visit (HOSPITAL_COMMUNITY): Payer: Medicaid Other

## 2022-05-20 ENCOUNTER — Encounter (HOSPITAL_COMMUNITY): Payer: Self-pay

## 2022-05-21 ENCOUNTER — Other Ambulatory Visit: Payer: Self-pay

## 2022-05-23 NOTE — Psych (Signed)
Virtual Visit via Video Note  I connected with Samantha Nguyen on 05/18/22 at  9:00 AM EST by a video enabled telemedicine application and verified that I am speaking with the correct person using two identifiers.  Location: Patient: patient home Provider: clinical home office   I discussed the limitations of evaluation and management by telemedicine and the availability of in person appointments. The patient expressed understanding and agreed to proceed.  I discussed the assessment and treatment plan with the patient. The patient was provided an opportunity to ask questions and all were answered. The patient agreed with the plan and demonstrated an understanding of the instructions.   The patient was advised to call back or seek an in-person evaluation if the symptoms worsen or if the condition fails to improve as anticipated.  Pt was provided 240 minutes of non-face-to-face time during this encounter.   Lorin Glass, LCSW   [facility] PHP THERAPIST PROGRESS NOTE  Samantha Nguyen [id number]  Session Time: 9:00 -10:00  Participation Level: Active  Behavioral Response: CasualAlertAnxious  Type of Therapy: Group Therapy  Treatment Goals addressed: Coping  Progress Towards Goals: Progressing  Interventions: CBT, DBT, Supportive, and Reframing  Summary: Samantha Nguyen is a 37 y.o. female who presents with anxiety.  Clinician led check-in regarding current stressors and situation, and review of patient completed daily inventory. Clinician utilized active listening and empathetic response and validated patient emotions. Clinician facilitated processing group on pertinent issues.?    Therapist Response: Patient arrived within time allowed. Patient rates her mood at a 8.5 on a scale of 1-10 with 10 being best. Pt states she feels "pretty good." Pt states she slept 8+ hours and ate 2x. Pt reports her sleep is rebounding and she is catching up on lost sleep and sleeping more steadily at night.  Pt reports "no real" physical anxiety symptoms and having "normal" anxiety which she is able to manage. Pt reports some stress re: returning to work. Patient able to process. Patient engaged in discussion.             Session Time: 10:00 am - 11:00 am   Participation Level: Active   Behavioral Response: CasualAlertDepressed   Type of Therapy: Group Therapy   Treatment Goals addressed: Coping   Progress Towards Goals: Progressing   Interventions: CBT, DBT, Solution Focused, Strength-based, Supportive, and Reframing   Therapist Response:  Cln introduced DBT distress tolerance distraction skills. Cln provides context for distraction skills and why distraction is a foundational way to manage mood dysregulation. Group discussed how to apply distraction.    Therapist Response: Pt engaged in discussion and reports understanding of how distraction can be applied to manage big feelings.            Session Time: 11:00 -12:00   Participation Level: Active   Behavioral Response: CasualAlertDepressed   Type of Therapy: Group Therapy   Treatment Goals addressed: Coping   Progress Towards Goals: Progressing   Interventions: CBT, DBT, Solution Focused, Strength-based, Supportive, and Reframing   Summary: Cln continued topic of DBT distress tolerance skills and the ACCEPTS distraction skill. Group reviewed A-C-C skills and discussed how they can practice them in their every day life.    Therapist Response: Pt engaged in discussion and is able to determine ways to incorporate skills into their life.            Session Time: 12:00 -1:00   Participation Level: Active   Behavioral Response: CasualAlertDepressed   Type of Therapy: Group therapy,  Occupational Therapy   Treatment Goals addressed: Coping   Progress Towards Goals: Progressing   Interventions: Supportive; Psychoeducation   Summary: 12:00 - 12:50: Occupational Therapy group led by cln E. Hollan. 12:50 - 1:00  Clinician assessed for immediate needs, medication compliance and efficacy, and safety concerns.   Therapist Response: 12:00 - 12:50: Pt participated 12:50 - 1:00 pm: At check-out, patient reports no immediate concerns. Patient demonstrates progress as evidenced by continued engagement and responsiveness to treatment. Patient denies SI/HI/self-harm thoughts at the end of group.    Suicidal/Homicidal: Nowithout intent/plan   Plan: EDITED 3/6: Pt was scheduled to d/c 3/6 however did not attend her last day. Pt will discharge from [facility] due to meeting treatment goals of decreased anxiety symptoms, increased emotion regulation, and increased ability to manage symptoms in a healthy manner. Pt will step down to OP therapy and medication management within this agency. Pt and provider are aligned with discharge plans. Pt denies SI/HI at time of discharge.    Collaboration of Care: Medication Management AEB A [place]  Patient/Guardian was advised Release of Information must be obtained prior to any record release in order to collaborate their care with an outside provider. Patient/Guardian was advised if they have not already done so to contact the registration department to sign all necessary forms in order for [name] to release information regarding their care.   Consent: Patient/Guardian gives verbal consent for treatment and assignment of benefits for services provided during this visit. Patient/Guardian expressed understanding and agreed to proceed.   Diagnosis: Illness anxiety disorder [F45.21]    1. Illness anxiety disorder   2. Generalized anxiety disorder       Lorin Glass, LCSW 05/23/2022

## 2022-05-25 NOTE — Progress Notes (Signed)
CM contacted pt; she is still without Medicaid. CM strongly urged pt to contact DSS for help with her Medicaid as she has no other insurance.CM provided pt with numbers for DSS.     Pt requesting help with transportation for appt next week.    [facility]  O: [phone]  C: [phone]

## 2022-05-26 ENCOUNTER — Telehealth: Payer: Self-pay

## 2022-05-26 ENCOUNTER — Ambulatory Visit: Payer: Self-pay | Admitting: Student in an Organized Health Care Education/Training Program

## 2022-05-26 NOTE — Telephone Encounter (Signed)
Jenna [name]  [id number]  [place] [address]     REFERRAL FROM:   [facility]: Social Work Follow Up    PRESENTING PROBLEM:                 Transportation    ADDITIONAL INFORMATION:  [facility] received a message from [employer] (CM): "CM contacted pt; she is still without Medicaid. CM strongly urged pt to contact DSS for help with her Medicaid as she has no other insurance.CM provided pt with numbers for DSS. Pt requesting help with transportation for appointment."    ACTION TAKEN:  TRANSPORTATION   ARRANGEMENTS:  Appointment: 3/13  @ 3:10PM  Pick-up time @ 2:10PM  Pick-up location  [place] [address]        Cab [employer] is   [employer] # [phone]  *Transportation is round trip  [place]

## 2022-05-29 ENCOUNTER — Other Ambulatory Visit: Payer: Self-pay

## 2022-05-31 NOTE — Telephone Encounter (Signed)
Not picked up/delivered

## 2022-06-01 NOTE — Progress Notes (Signed)
CM attempted to call pt yesterday, however pt asked if they could call back in 5 minutes and did not call CM back. NOD was sent last week to inform pt of discharge. Pt is welcome to request [facility] services once her Medicaid is active again.    [facility] Manager  [facility]  O: [phone]  C: [phone]

## 2022-06-03 ENCOUNTER — Ambulatory Visit (HOSPITAL_COMMUNITY): Payer: Medicaid Other | Admitting: Student in an Organized Health Care Education/Training Program

## 2022-06-12 NOTE — Progress Notes (Deleted)
[place] OF [facility]  OBSTETRICS AND GYNECOLOGY     Gender Wellness OB/GYN  New Patient    Subjective     Jenna [name] is a 37 y.o. [id number] female who presents to clinic today to establish GYN care.      She has a history of heavy menses. She was seen at [facility] ED on 01/06/22 for heavy vaginal bleeding, H/H 6.3/25, received 2u pRBC.     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    Pica F50.89    Twin pregnancy O30.009    S/P repeat C-section + BTL Z98.891    Biliary colic K80.50    Iron deficiency anemia due to chronic blood loss D50.0       Past Surgical History:  Past Surgical History:   Procedure Laterality Date    CESAREAN SECTION, CLASSIC      CESAREAN SECTION, LOW TRANSVERSE      DENTAL SURGERY      Dental Surgery Conversion Data     PR LIG/TRNSXJ FALOPIAN TUBE CESAREAN DEL/ABDML SURG Bilateral 11/17/2017    Procedure: C-SECTION WITH TUBAL LIGATION;  Surgeon: Margart Sickles, MD;  Location: [facility] L&D;  Service: OBGYN        Family History:  Family History   Problem Relation Age of Onset    Stroke Mother     Hypertension Mother     Cancer Mother         lesion on her lung    Diabetes Paternal Grandfather     Diabetes Paternal Grandmother     Diabetes Sister     Breast cancer Neg Hx     Ovarian cancer Neg Hx     Colon cancer Neg Hx         Medications:  Prior to Admission medications    Medication Sig Start Date End Date Taking? Authorizing Provider   blood pressure monitor Use as directed 05/19/22   Hardie Pulley, MD   albuterol HFA (PROVENTIL, VENTOLIN,  PROAIR HFA) 108 (90 Base) MCG/ACT inhaler Inhale 1-2 puffs into the lungs every 6 hours as needed for Wheezing.  Shake well before each use. 05/19/22   Hardie Pulley, MD   budesonide-formoterol [facility]) 160-4.5 MCG/ACT inhaler Inhale 2 puffs by mouth into the lungs 2 times daily. Shake well before each use. 05/19/22   Hardie Pulley, MD   amLODIPine (NORVASC) 5 mg tablet Take 2 tablets (10 mg total) by mouth daily 05/19/22 08/17/22  Hardie Pulley, MD   carvedilol (COREG) 6.25 mg tablet Take 2 tablets (12.5 mg total) by mouth 2 times daily (with meals) 05/19/22 11/15/22  Hardie Pulley, MD   acetaminophen (TYLENOL) 500 mg tablet Take 2 tablets (1,000 mg total) by mouth 3 times daily as needed 11/25/21   Daylene Katayama, MD   doxycycline hyclate (VIBRA-TABS) 100 mg tablet Take 1 tablet (100 mg total) by mouth every 12 hours  for Hidradenitis Suppurativa 06/17/21   Jacquelyne Balint, MD  guaiFENesin-dextromethorphan (ROBITUSSIN DM) 100-10 mg/60mL syrup Take 5 mLs by mouth 3 times daily as needed for Cough 04/20/21   Grayce Sessions, MD   Meds & Orders Exist in Blood Admin Plan  12/04/20   Dorcas Mcmurray, NP   nicotine (NICODERM CQ) 7 MG/24HR patch Apply 1 patch onto the skin daily  Patient not taking: Reported on 11/21/2020 11/13/20   Butch Penny, NP        Allergies:  Allergies   Allergen Reactions    Seasonal Allergies Rash        Obstetric History:  OB History   Gravida Para Term Preterm AB Living   8 7 7   1 7    SAB IAB Ectopic Multiple Live Births     1   1 8       # Outcome Date GA Lbr Len/2nd Weight Sex Type Anes PTL Lv   8A Term 11/17/17 [redacted]w[redacted]d  3040 g (6 lb 11.2 oz) M CSLT SPINAL- N LIV   8B Term 11/17/17 [redacted]w[redacted]d  3130 g (6 lb 14.4 oz) M CSLT SPINAL- N LIV   7 Term 06/24/16 [redacted]w[redacted]d  3840 g (8 lb 7.5 oz) F CS-LTranv GENERAL- N LIV   6 Term 10/27/12 [redacted]w[redacted]d  3700 g (8 lb 2.5 oz) M CSLT Gen  LIV   5 Term 11/26/09 [redacted]w[redacted]d  3549 g (7 lb 13.2 oz) F CS-LTranv Spinal N LIV      Birth Comments: Repeat C/S, HTN    4 Term 08/27/07 [redacted]w[redacted]d 07:00 4082 g (9 lb) M CS-LTranv EPI  LIV      Birth Comments: FTP, HTN   3 Term 06/23/06 [redacted]w[redacted]d 04:00 3714 g (8 lb 3 oz) M Vag-Vacuum   DEC      Birth Comments: fetal distress, SIDS death at 5months, HTN   2 Term 07/04/03 [redacted]w[redacted]d 02:00 3260 g (7 lb 3 oz) M Vag-Spont EPI  LIV      Birth Comments: preeclampsia   1 IAB               Obstetric Comments   Cystic Fibrosis screen, 08/12/09:  No mutation   Hemoglobin electrophoresis, 03/27/12:  Normal       Gynecologic History:  Sexually Active: YesPartner(s):   Female   Birth-Control/Protection: Condom       Social History:  Social History     Socioeconomic History    Marital status: Single   Tobacco Use    Smoking status: Every Day     Packs/day: .25     Types: Cigarettes    Smokeless tobacco: Never    Tobacco comments:     1/2 ppd   Substance and Sexual Activity    Alcohol use: No     Alcohol/week: 0.0 standard drinks of alcohol     Comment: denies current use    Drug use: No     Types: Cocaine     Comment: cocaine and [facility] April 2018 per pt    Sexual activity: Yes     Partners: Male     Birth control/protection: Condom         Objective     Physical Exam:  There were no vitals filed for this visit.  There is no height or weight on file to calculate BMI.     General: {appearance:315021::"Pleasant, well-appearing female, in NAD"}  HEENT: Normocephalic, atraumatic.  Respiratory: Unlabored on RA.   Cardiovascular: Regular rate and rhythm, skin warm and well-perfused.   Abdomen: Soft, nontender, nondistended.  Extremities: No edema.     Chaperone: ***  Breast: Comprehensive breast examination performed. Breasts are symmetric, with normal shape and contour. Skin is without erythema, edema, nodules or other lesions. Nipples are normal and everted. On palpation there are no masses palpable in either breast. No discharge is expressible.  Pelvic:   - External genitalia normal in appearance without lesion.  Pubic hair *** distribution. Urethra normal in appearance  and location without evidence of irritation or infection.    - Speculum exam reveals vagina with normal rugae, mucosa without lesion.  *** discharge noted.  *** blood noted in the vaginal vault.  Cervix with*** lesion.  No cervical discharge noted.  - Bimanual exam reveals ***verted uterus. No adnexal tenderness*** or fullness appreciated. ***No CMT.      Cultures collected:  {NA/explanation:16022516}  Wet prep: pH {ph wet mount:14608} {pe wet prep/koh:313109::"no pathogens"}    GYN [name] 12/2021:   Transvaginal Findings:  Uterus measures 13.2 cm x 4.8 cm x 5.3 cm with normal echotexture and shape. No uterine masses identified. Cervix appears within normal limits. Endometrium could not be measured due to uterine position.    The right ovary is not visualized due to overlying bowel gas. No right adnexal lesions.    The left ovary is not visualized due to overlying bowel gas. No left adnexal lesions.    No significant free fluid.    Impression: Unremarkable transvaginal ultrasound. Bilateral ovaries are not visualized due to overlying bowel gas.      Assessment and Plan     Jenna [name] is a 37 y.o. [id number] female who presents for annual GYN exam and to establish care at this practice.    GYN Screening:   - Last annual exam ***.  - Pap smear, with *** HPV sent.  Last pap ***, in ***.  Next due: ***.  - Yolonda was offered comprehensive STI testing, including GC/CT/Trichomonas, HIV, Hepatitis C, and Syphilis.  She does *** desire screening.  HIV  state consent reviewed and patient gave consent.  Orders placed for laboratory collection.  - HPV vaccine: {}    Contraception:   {Fauna is an otherwise healthy female who has no contraindications to any contraceptive options.  We discussed the variety of options available to her, including combined hormonal options (pills, patch, NuvaRing), progesterone-only options (pills, Depo Provera, Nexplanon, levonogestrel IUD), non-hormonal options (barrier methods like condoms;  ParaGard IUD), and surgical options (bilateral salpingectomy).  We discussed the failure rates of each method, with perfect use versus typical use.  Discussed LARC methods at length, as these are most effective at preventing pregnancy.  Explained the mechanism of action of each method.  Discussed that both the implant and IUDs are highly effective and readily reversible, with a quick return to fertility.  Discussed length of use, four years for Nexplanon, eight years for Mirena, and ten years for ParaGard. Discussed that Mirena has the added benefit of inducing amenorrhea in some patients and lighter menses in most. Discussed that she may have irregular cycles/bleeding with Nexplanon, as about 1/3 of women experience irregular, unpredictable bleeding with this method. Discussed that the ParaGard IUD would be another highly effective option, particularly if she would like to avoid hormonal options altogether, but does not have any of the benefit of regulating cycles. Discussed bilateral salpingectomy}    {Based on her medical history of ***, Daileen should avoid contraception options that contain estrogen.  We discussed the formulations of progesterone-only contraceptives, including pills, Depo Provera injections,  Nexplanon, and Mirena. We also discussed the ParaGard IUD as it is a hormone free option and bilateral salpingectomy as a surgical, permanent option. Benefits of each formulation were considered, focusing on LARC methods. Discussed that both Nexplanon and Mirena are safe given her medical history.  Discussed that both are highly effective and readily reversible, with a quick return to fertility.  Discussed length of use, four years for Nexplanon and eight years for Mirena.  Discussed that Mirena has the added benefit of inducing amenorrhea in some patients and lighter menses in most. Discussed that she may have irregular cycles/bleeding with Nexplanon, as about 1/3 of women experience irregular,  unpredictable bleeding with this method. Discussed that the ParaGard IUD would be another highly effective option, particularly if she would like to avoid hormonal options altogether, but does not have any of the benefit of regulating cycles. Discussed bilateral salpingectomy.}    After our discussion, Amillian Skehan would like to proceed with ***.    We discussed patient's family planning goals. The patient {Pregnancy Choice:30412123} to become pregnant in the next year.  {OKQ choices:39064} were discussed & recommended.    Pregnancy Testing:  A urine pregnancy test conducted during this visit demonstrated that Kieonna is ***not currently pregnant.    Annual GYN Exam due: ***  Return to clinic ***    D/w ***    Myrene Buddy, MD  OBGYN PGY-1

## 2022-06-15 ENCOUNTER — Ambulatory Visit: Payer: Self-pay

## 2022-06-22 ENCOUNTER — Ambulatory Visit (HOSPITAL_COMMUNITY): Payer: Medicaid Other | Admitting: Clinical

## 2022-06-24 ENCOUNTER — Telehealth: Payer: Self-pay

## 2022-06-24 NOTE — Telephone Encounter (Signed)
Pt called and left vm requesting a call back from you, regarding issues with passing a kidney stone & needing a dr's note for tomorrow. Asked if she needs an appointment or if you can write a dr's note for her

## 2022-06-25 NOTE — Telephone Encounter (Signed)
Ok if she has not passed kidney stone, please send in flomax 0.4 mg #5 caps Take 1 capsule by mouth daily x 5 days with no ref.  Also take IBU 800 mg every 8 hours with food and large glass of water.   Ok for note for work stating she is having kidney stone passing and needs to be out of work prn

## 2022-06-28 ENCOUNTER — Ambulatory Visit (HOSPITAL_COMMUNITY): Payer: Medicaid Other | Admitting: Clinical

## 2022-06-28 NOTE — Telephone Encounter (Signed)
Sent a portal message about the kidney stones

## 2022-07-01 IMAGING — CT CT RENAL STONE PROTOCOL
2 of 3 series · 15 of 46 positions shown, 17 images · non-contrast
Comparison: CT examination dated August 24, 2016

CLINICAL DATA: Flank pain, kidney stone suspected



[Series 4: coronal · coronal · 0.70mm/px · 3 of 124 slices shown]
[im 42/124  soft-tissue]
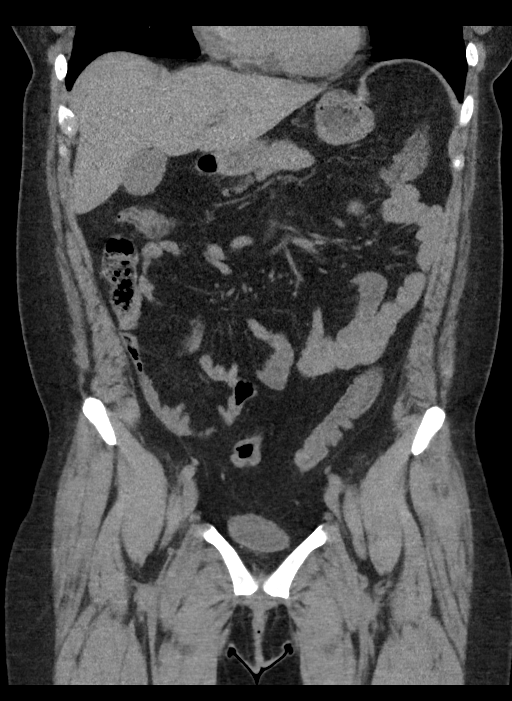
[im 55/124  soft-tissue]
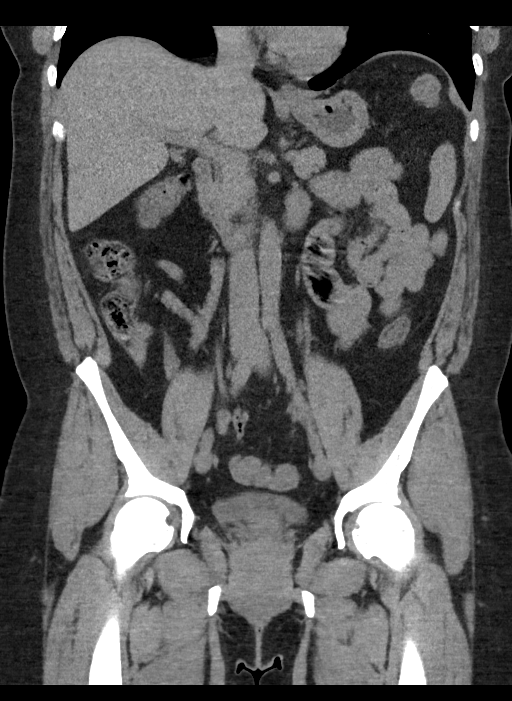
[im 69/124  soft-tissue]
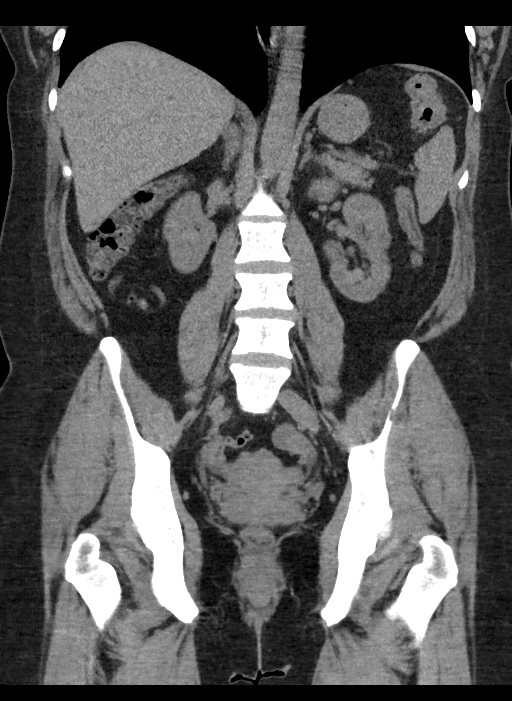

[Series 6: lung bases · axial · 0.81mm/px · z∈[-247,-113]mm · 12 of 77 slices shown, 14 images]
[im 5/77  soft-tissue]
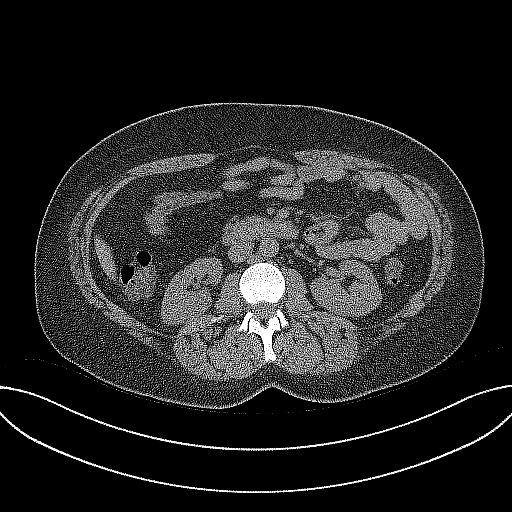
[im 5/77  bone]
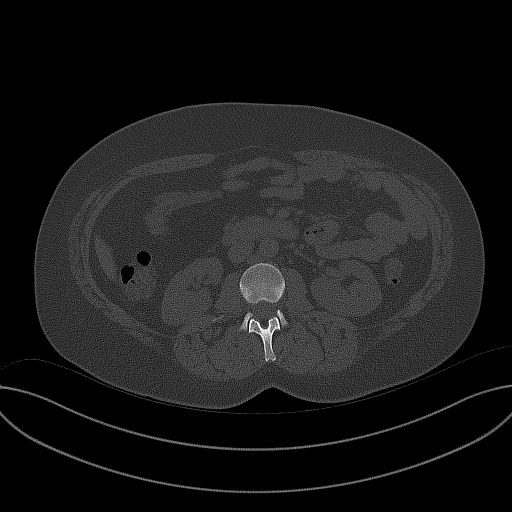
[im 10/77  soft-tissue]
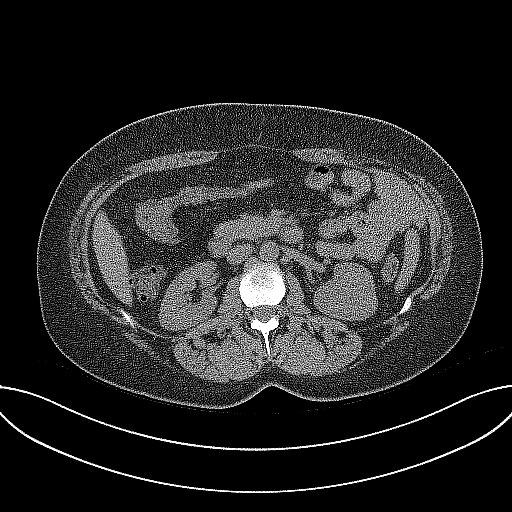
[im 18/77  soft-tissue]
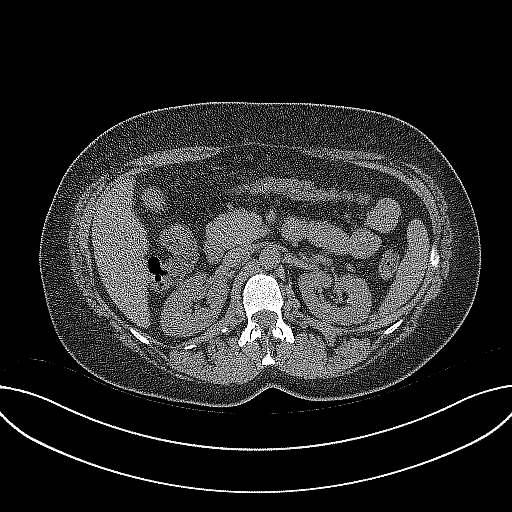
[im 23/77  soft-tissue]
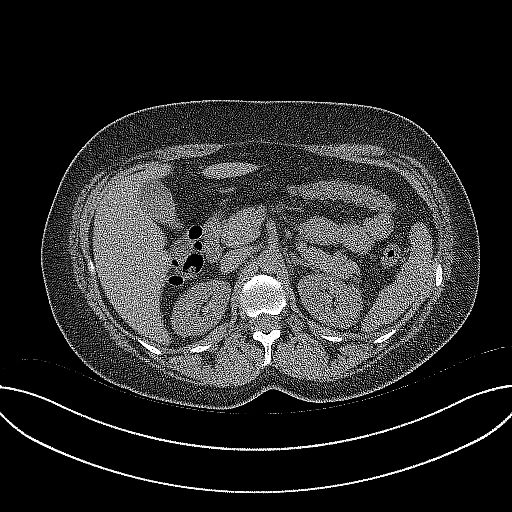
[im 30/77  soft-tissue]
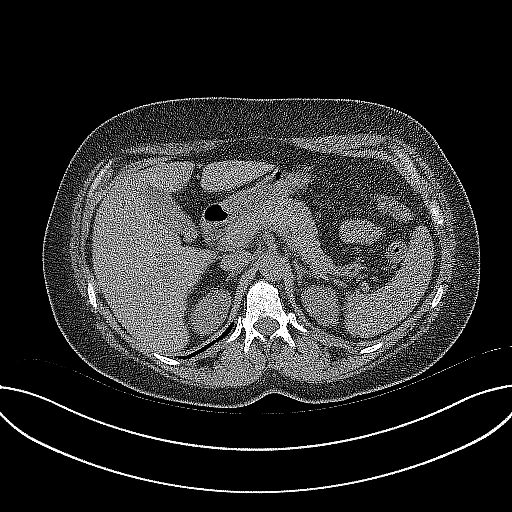
[im 35/77  soft-tissue]
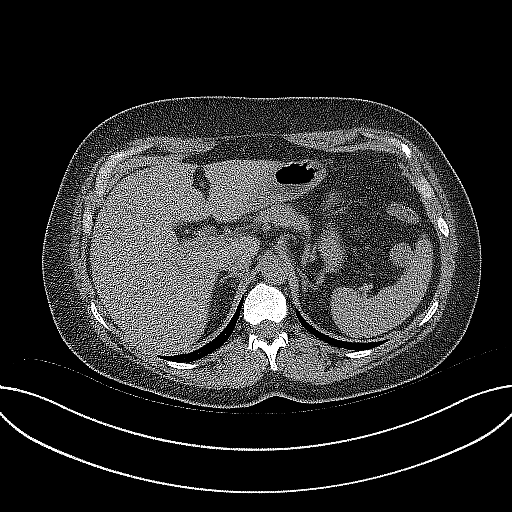
[im 42/77  soft-tissue]
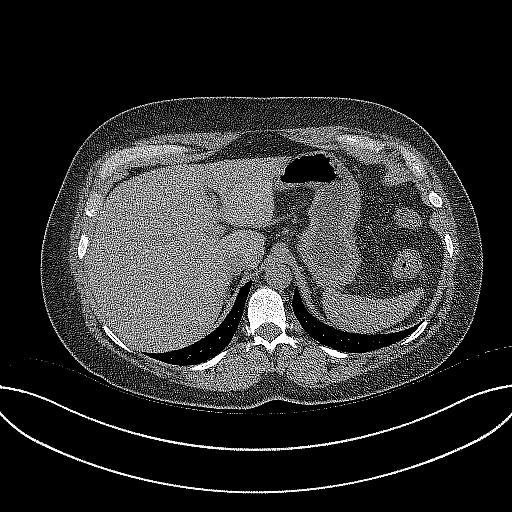
[im 47/77  soft-tissue]
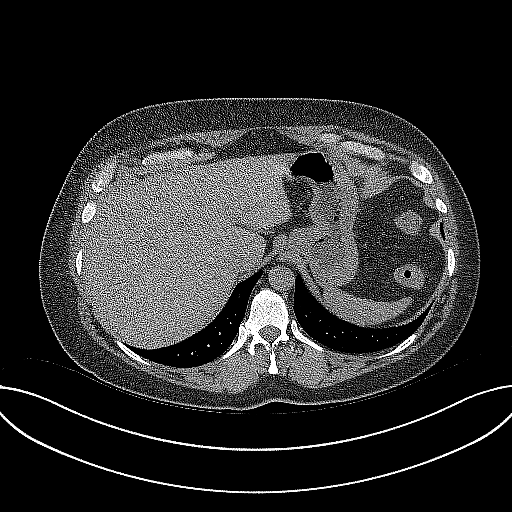
[im 54/77  soft-tissue]
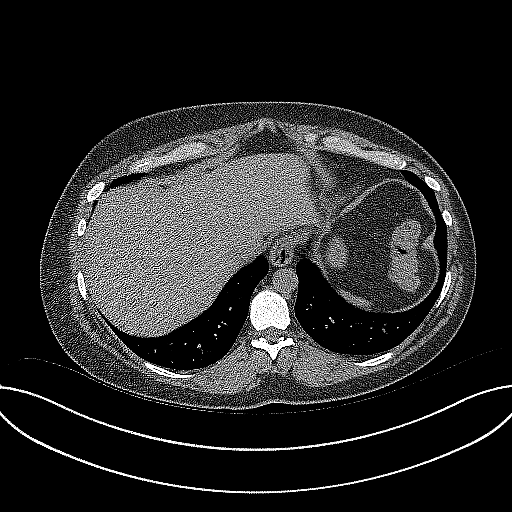
[im 54/77  bone]
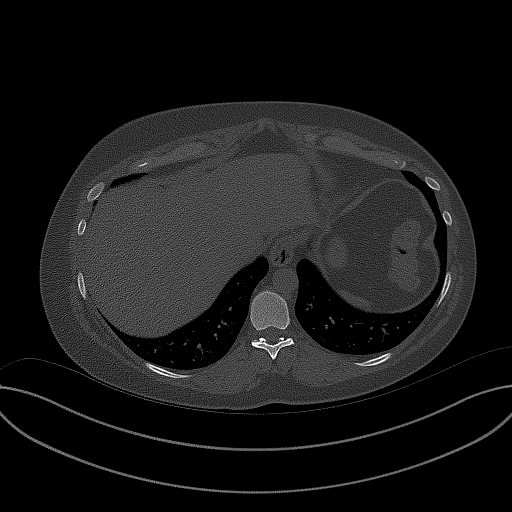
[im 59/77  soft-tissue]
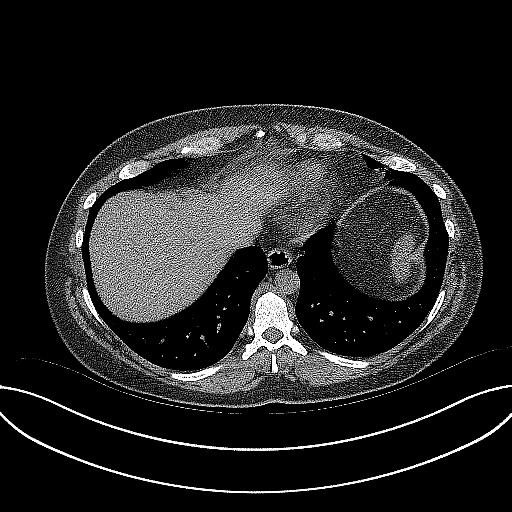
[im 67/77  soft-tissue]
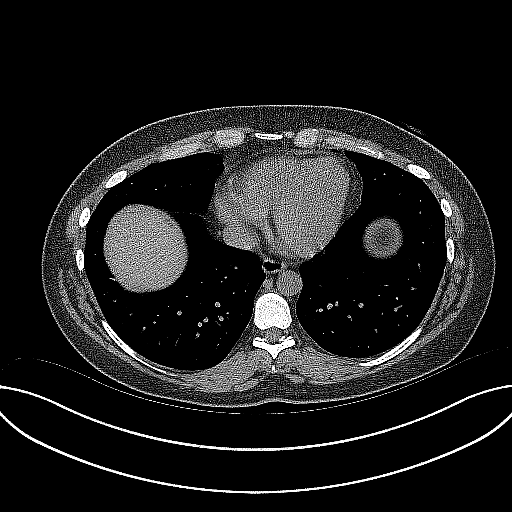
[im 72/77  soft-tissue]
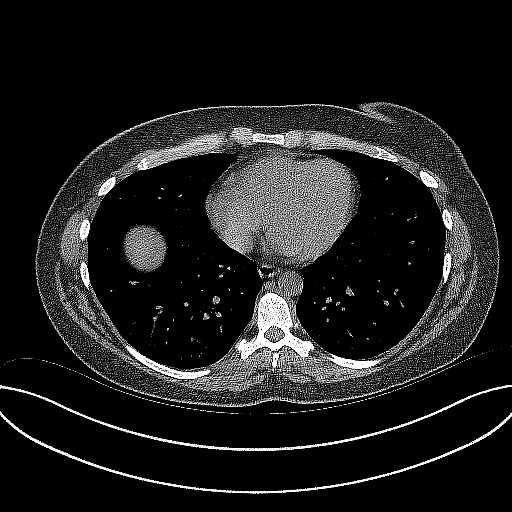

[15 of 46 positions shown; findings below may reference images not displayed]

FINDINGS: Lower chest: No acute abnormality.

Hepatobiliary: No focal liver abnormality is seen. No gallstones,
gallbladder wall thickening, or biliary dilatation.

Pancreas: Unremarkable. No pancreatic ductal dilatation or
surrounding inflammatory changes.

Spleen: Normal in size without focal abnormality.

Adrenals/Urinary Tract: Adrenal glands are unremarkable.

There is again multiple calculi in the distal right
ureter/ureterovesicular junction. The right ureter is however normal
in caliber. There is a nonobstructing calculus in the interpolar
region of the right kidney. There are multiple nonobstructing
calculus in the upper pole of the left kidney measuring 3-4 mm.
There is also another 3 mm nonobstructing calculus in the interpolar
region of the left kidney. Left ureter is normal in caliber without
evidence of hydronephrosis or ureteral calculus.

Stomach/Bowel: Stomach is within normal limits. Appendix appears
normal. No evidence of bowel wall thickening, distention, or
inflammatory changes.

Vascular/Lymphatic: No significant vascular findings are present. No
enlarged abdominal or pelvic lymph nodes.

Reproductive: Uterus and bilateral adnexa are unremarkable.

Other: No abdominal wall hernia or abnormality. No abdominopelvic
ascites.

Musculoskeletal: No acute or significant osseous findings.
IMPRESSION: 1.  Nonobstructing calculi in bilateral kidneys.

2. Cluster of calculi at the right ureterovesical junction, similar
to prior examination. Right ureter is however normal in caliber
without evidence of surrounding inflammatory changes.

3. Bowel loops are normal in caliber. No evidence of colitis or
diverticulitis. Normal appendix.

## 2022-07-02 ENCOUNTER — Ambulatory Visit (HOSPITAL_COMMUNITY): Payer: Medicaid Other | Admitting: Student in an Organized Health Care Education/Training Program

## 2022-07-05 ENCOUNTER — Encounter: Payer: Self-pay | Admitting: Nurse Practitioner

## 2022-07-19 ENCOUNTER — Encounter: Payer: Medicaid Other | Admitting: Nurse Practitioner

## 2022-07-26 ENCOUNTER — Telehealth: Payer: Self-pay | Admitting: Nurse Practitioner

## 2022-07-26 NOTE — Telephone Encounter (Signed)
Patient called in very upset and scared because her BP has been running elevated. The two readings she provided me with are below:   Saturday 157/97 This morning 140/104  Please advise on any advice for patient. She does not want to go on medication and became more upset and crying stating that "she does not want to die". Advised her that this is going to be okay and to calm down and breathe so that her BP can come down.

## 2022-07-27 ENCOUNTER — Encounter: Payer: Self-pay | Admitting: Nurse Practitioner

## 2022-07-27 ENCOUNTER — Ambulatory Visit: Payer: Medicaid Other | Admitting: Nurse Practitioner

## 2022-07-27 ENCOUNTER — Telehealth (HOSPITAL_COMMUNITY): Payer: Self-pay | Admitting: Licensed Clinical Social Worker

## 2022-07-27 VITALS — BP 128/90 | HR 81 | Ht 67.0 in | Wt 172.8 lb

## 2022-07-27 DIAGNOSIS — F411 Generalized anxiety disorder: Secondary | ICD-10-CM | POA: Diagnosis not present

## 2022-07-27 DIAGNOSIS — G44209 Tension-type headache, unspecified, not intractable: Secondary | ICD-10-CM

## 2022-07-27 DIAGNOSIS — R030 Elevated blood-pressure reading, without diagnosis of hypertension: Secondary | ICD-10-CM | POA: Insufficient documentation

## 2022-07-27 HISTORY — DX: Tension-type headache, unspecified, not intractable: G44.209

## 2022-07-27 MED ORDER — ALPRAZOLAM 0.5 MG PO TABS: 0.5000 mg | ORAL_TABLET | Freq: Two times a day (BID) | ORAL | 0 refills | Status: DC | PRN

## 2022-07-27 NOTE — Telephone Encounter (Signed)
Cln returned pt's call and immediate assessed for SI. Pt denied SI and endorsed continued illness anxiety and difficulty working due to anxiety. She was tearful throughout the call. She states she started seeing a therapist but did not follow up with a psychiatrist. Per chart review, she cancelled psychiatry and therapy appointments that were made at [facility] prior to d/c from [facility], as well as rescheduled appointments. Pt stated her employer would not allow her to keep leaving early and that she did not want to go to different doctors. 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 Cln informed pt that she cannot return to this PHP within six months of discharge and recommended she contact [facility] or continue in outpatient therapy and see a psychiatric provider. Cln provided psychoeducation on the benefits of seeing a psychiatric provider vs a PCP for psych meds. Pt asked if cln could help pt with this, and cln informed pt she would need to come to [facility] during walk-in hours or contact [facility] to see who is in network, as this cln is unsure of which providers are in network with [facility] due to the recent transition from [place]. Pt expressed fear that she is dying and cln reminded pt of the numerous ED visits and tests she has had done and reminded her that it is most likely anxiety. Cln urged pt to work with her therapist specifically on her illness anxiety and her resistance to treatment, as pt did not allow PHP psychiatrist to make med changes during her treatment. Cln emphasized that her symptoms will not change if she does not do anything differently to address them. Cln asked if pt believes she could benefit from inpatient treatment due to symptom severity and pt declined. Cln strongly encouraged pt to prioritize therapy and medication management and OV PHP and to work on anxiety surrounding adherence to treatment. Pt verbalized understanding.

## 2022-07-27 NOTE — Patient Instructions (Signed)
1) CPE with Pap and bring BP cuff

## 2022-07-27 NOTE — Progress Notes (Signed)
Established Patient Office Visit  Subjective:  Patient ID: Samantha Nguyen, female    DOB: [birth year]  Age: 37 y.o. MRN: [id number]  Chief Complaint  Patient presents with   Acute Visit    Discuss BP    Acute visit.  Wants to discuss BP.  Pt wants work note to be out until Monday.  She is not able to tolerate metoprolol for low dose.  Patient would like alprazolam for anxiety, not sleeping.      No other concerns at this time.   Past Medical History:  Diagnosis Date   Anxiety    Depressed    History of kidney stones    Migraine    Renal disorder     Past Surgical History:  Procedure Laterality Date   CESAREAN SECTION     CYSTOSCOPY W/ URETERAL STENT PLACEMENT Right 08/24/2016   Procedure: CYSTOSCOPY WITH RETROGRADE PYELOGRAM/URETERAL STENT PLACEMENT;  Surgeon: Malen Gauze, MD;  Location: [facility];  Service: Urology;  Laterality: Right;   CYSTOSCOPY WITH RETROGRADE PYELOGRAM, URETEROSCOPY AND STENT PLACEMENT Right 09/09/2016   Procedure: CYSTOSCOPY WITH RETROGRADE PYELOGRAM, URETEROSCOPY AND STENT REPLACEMENT;  Surgeon: Malen Gauze, MD;  Location: [facility];  Service: Urology;  Laterality: Right;   MANDIBLE SURGERY      Social History   Socioeconomic History   Marital status: Single    Spouse name: Not on file   Number of children: 1   Years of education: Not on file   Highest education level: Some college, no degree  Occupational History   Not on file  Tobacco Use   Smoking status: Former   Smokeless tobacco: Never  Vaping Use   Vaping Use: Some days   Substances: CBD  Substance and Sexual Activity   Alcohol use: Yes    Comment: occasionally   Drug use: No   Sexual activity: Not on file  Other Topics Concern   Not on file  Social History Narrative   Not on file   Social Determinants of Health   Financial Resource Strain: Not on file  Food Insecurity: Not on file  Transportation Needs: Not on file  Physical Activity: Not on  file  Stress: Not on file  Social Connections: Not on file  Intimate Partner Violence: Not on file    Family History  Problem Relation Age of Onset   Hypertension Mother    Ulcerative colitis Mother    Alcohol abuse Father    Drug abuse Father    Anxiety disorder Father    Drug abuse Brother    Anxiety disorder Maternal Grandfather     Allergies  Allergen Reactions   Sulfa Antibiotics Other (See Comments)    Unknown, happened when she was a child    Review of Systems  Constitutional: Negative.   HENT: Negative.    Eyes: Negative.   Respiratory: Negative.    Cardiovascular: Negative.   Gastrointestinal: Negative.   Genitourinary: Negative.   Musculoskeletal: Negative.   Skin: Negative.   Neurological: Negative.   Endo/Heme/Allergies: Negative.   Psychiatric/Behavioral:  Positive for depression. The patient is nervous/anxious.        Objective:   Ht 5\' 7"  (1.702 m)   Wt 172 lb 12.8 oz (78.4 kg)   BMI 27.06 kg/m   Vitals:   07/27/22 1504  Height: 5\' 7"  (1.702 m)  Weight: 172 lb 12.8 oz (78.4 kg)  BMI (Calculated): 27.06    Physical Exam Vitals reviewed.  Constitutional:  Appearance: Normal appearance.  HENT:     Head: Normocephalic.     Nose: Nose normal.     Mouth/Throat:     Mouth: Mucous membranes are moist.  Eyes:     Pupils: Pupils are equal, round, and reactive to light.  Cardiovascular:     Rate and Rhythm: Normal rate and regular rhythm.  Pulmonary:     Effort: Pulmonary effort is normal.     Breath sounds: Normal breath sounds.  Abdominal:     General: Bowel sounds are normal.     Palpations: Abdomen is soft.  Musculoskeletal:        General: Tenderness present.     Cervical back: Normal range of motion and neck supple.  Skin:    General: Skin is warm and dry.  Neurological:     Mental Status: She is alert and oriented to person, place, and time.  Psychiatric:        Mood and Affect: Mood normal.        Behavior: Behavior  normal.      No results found for any visits on 07/27/22.  Recent Results (from the past 2160 hour(s))  Comprehensive metabolic panel     Status: Abnormal   Collection Time: 05/02/22 12:47 PM  Result Value Ref Range   Sodium 136 135 - 145 mmol/L   Potassium 3.9 3.5 - 5.1 mmol/L   Chloride 103 98 - 111 mmol/L   CO2 22 22 - 32 mmol/L   Glucose, Bld 98 70 - 99 mg/dL    Comment: Glucose reference range applies only to samples taken after fasting for at least 8 hours.   BUN 5 (L) 6 - 20 mg/dL   Creatinine, Ser 6.96 0.44 - 1.00 mg/dL   Calcium 9.8 8.9 - 29.5 mg/dL   Total Protein 7.6 6.5 - 8.1 g/dL   Albumin 4.3 3.5 - 5.0 g/dL   AST 33 15 - 41 U/L   ALT 36 0 - 44 U/L   Alkaline Phosphatase 60 38 - 126 U/L   Total Bilirubin 0.2 (L) 0.3 - 1.2 mg/dL   GFR, Estimated >28 >41 mL/min    Comment: (NOTE) Calculated using the CKD-EPI Creatinine Equation (2021)    Anion gap 11 5 - 15    Comment: Performed at [facility], [address] [place], [name] [address]  Acetaminophen level     Status: Abnormal   Collection Time: 05/02/22 12:47 PM  Result Value Ref Range   Acetaminophen (Tylenol), Serum <10 (L) 10 - 30 ug/mL    Comment: (NOTE) Therapeutic concentrations vary significantly. A range of 10-30 ug/mL  may be an effective concentration for many patients. However, some  are best treated at concentrations outside of this range. Acetaminophen concentrations >150 ug/mL at 4 hours after ingestion  and >50 ug/mL at 12 hours after ingestion are often associated with  toxic reactions.  Performed at [facility], [address] [place], [name] [address]   Ethanol     Status: None   Collection Time: 05/02/22 12:47 PM  Result Value Ref Range   Alcohol, Ethyl (B) <10 <10 mg/dL    Comment: (NOTE) Lowest detectable limit for serum alcohol is 10 mg/dL.  For medical purposes only. Performed at [facility], [address], [place], [name] [address]   Salicylate  level     Status: Abnormal   Collection Time: 05/02/22 12:47 PM  Result Value Ref Range   Salicylate Lvl <7.0 (  L) 7.0 - 30.0 mg/dL    Comment: Performed at [facility], [address], [place], [name] [address]  CBC with Differential     Status: None   Collection Time: 05/02/22 12:47 PM  Result Value Ref Range   WBC 7.4 4.0 - 10.5 K/uL   RBC 5.05 3.87 - 5.11 MIL/uL   Hemoglobin 14.6 12.0 - 15.0 g/dL   HCT 95.2 84.1 - 32.4 %   MCV 88.5 80.0 - 100.0 fL   MCH 28.9 26.0 - 34.0 pg   MCHC 32.7 30.0 - 36.0 g/dL   RDW 40.1 02.7 - 25.3 %   Platelets 375 150 - 400 K/uL   nRBC 0.0 0.0 - 0.2 %   Neutrophils Relative % 68 %   Neutro Abs 4.9 1.7 - 7.7 K/uL   Lymphocytes Relative 23 %   Lymphs Abs 1.7 0.7 - 4.0 K/uL   Monocytes Relative 9 %   Monocytes Absolute 0.7 0.1 - 1.0 K/uL   Eosinophils Relative 0 %   Eosinophils Absolute 0.0 0.0 - 0.5 K/uL   Basophils Relative 0 %   Basophils Absolute 0.0 0.0 - 0.1 K/uL   Immature Granulocytes 0 %   Abs Immature Granulocytes 0.03 0.00 - 0.07 K/uL    Comment: Performed at [facility], [address] [place], [name] [address]  T4, free     Status: Abnormal   Collection Time: 05/02/22 12:47 PM  Result Value Ref Range   Free T4 1.16 (H) 0.61 - 1.12 ng/dL    Comment: (NOTE) Biotin ingestion may interfere with free T4 tests. If the results are inconsistent with the TSH level, previous test results, or the clinical presentation, then consider biotin interference. If needed, order repeat testing after stopping biotin. Performed at [facility], [address] [place], [name] [address]   TSH     Status: None   Collection Time: 05/02/22 12:48 PM  Result Value Ref Range   TSH 1.220 0.350 - 4.500 uIU/mL    Comment: Performed by a 3rd Generation assay with a functional sensitivity of <=0.01 uIU/mL. Performed at [facility], [address] [place], [name] [address]   I-Stat beta hCG blood, ED     Status: None    Collection Time: 05/02/22  1:21 PM  Result Value Ref Range   I-stat hCG, quantitative <5.0 <5 mIU/mL   Comment 3            Comment:   GEST. AGE      CONC.  (mIU/mL)   <=1 WEEK        5 - 50     2 WEEKS       50 - 500     3 WEEKS       100 - 10,000     4 WEEKS     1,000 - 30,000        FEMALE AND NON-PREGNANT FEMALE:     LESS THAN 5 mIU/mL       Assessment & Plan:   Problem List Items Addressed This Visit   None   No follow-ups on file.   Total time spent: 35 minutes  Orson Eva, NP  07/27/2022

## 2022-08-05 ENCOUNTER — Ambulatory Visit: Payer: Medicaid Other | Admitting: Nurse Practitioner

## 2022-08-06 ENCOUNTER — Encounter: Payer: Self-pay | Admitting: Nurse Practitioner

## 2022-08-06 ENCOUNTER — Other Ambulatory Visit: Payer: Self-pay | Admitting: Nurse Practitioner

## 2022-08-06 MED ORDER — DOXYCYCLINE HYCLATE 100 MG PO TABS: 100.0000 mg | ORAL_TABLET | Freq: Two times a day (BID) | ORAL | 0 refills | Status: DC

## 2022-08-18 ENCOUNTER — Telehealth: Payer: Self-pay | Admitting: Internal Medicine

## 2022-08-18 DIAGNOSIS — I10 Essential (primary) hypertension: Secondary | ICD-10-CM

## 2022-08-18 NOTE — Telephone Encounter (Signed)
Copied from CRM #[id number]. Topic: Access to Care - Speak to Provider/Office Staff  >> Aug 18, 2022 10:18 AM Elnita Maxwell wrote:  Dae calling to report that the patient is experiencing hard time walking -heal pain and heavy breathing.   These symptoms have been going on for 2 month(s)    Patient requesting same day appointment?   Patient requesting call back? yes    Phone number confirmed at Telephone Information:  Mobile          [phone]    Writer tried to schedule but nothing available this week.    Also separate message for social worker.

## 2022-08-18 NOTE — Telephone Encounter (Signed)
Left patient a voicemail message. Requested patient call office back. Office number provided on VM message.      ________________  Notes to review with patient when reaching her:        3/24 [facility] provider office visit. Patient also c/o worsening SOB, wheezing.Plan for visit for BP and asthma.       HTN  Dyspnea on exertion and orthopnea  Day time somnolence  - refill amlodipine 10 mg  - start coreg 12.5 mg BID  - refer to sleep medicine  - close follow-up in clinic next week     Asthma  - good air movement and no wheezes on exam   - refill  symibocrt BID and albuterol PRN; last used a few ago

## 2022-08-18 NOTE — Telephone Encounter (Signed)
Copied from CRM #[id number]. Topic: Access to Care - Speak to Provider/Office Staff  >> Aug 18, 2022 10:13 AM Elnita Maxwell wrote:  Jenna [name] is requesting to speak to Corrine, social worker about    housing.    Please call Jenna [name] at Telephone Information:  Mobile          [phone]    Separate message to triage nurse for symptoms.

## 2022-08-18 NOTE — Telephone Encounter (Signed)
Spoke with Luster Landsberg, SW. Social work with touch base with patient.

## 2022-08-20 ENCOUNTER — Telehealth: Payer: Self-pay | Admitting: Internal Medicine

## 2022-08-20 NOTE — Telephone Encounter (Signed)
Writer faxed requested ROI to Medical Records at fax number [phone] and received confirmation on 08/20/2022 at 11:49 am.

## 2022-08-20 NOTE — Telephone Encounter (Unsigned)
Copied from CRM [id number]. Topic: Information Request - Other  >> Aug 20, 2022 10:15 AM Danie Binder wrote:  Lynden Ang from [employer] called to ask about the status of the medical release form they sent in to gain access to the patients upcoming appointment Etc.    Lynden Ang can be reached at [phone]

## 2022-08-20 NOTE — Telephone Encounter (Signed)
Writer spoke with Lynden Ang and faxed last progress note and problem list to provided fax  number.

## 2022-08-24 ENCOUNTER — Other Ambulatory Visit: Payer: Self-pay

## 2022-08-24 MED ORDER — AMLODIPINE BESYLATE 5 MG PO TABS *I*
10.0000 mg | ORAL_TABLET | Freq: Every day | ORAL | 1 refills | Status: DC
Start: 2022-08-24 — End: 2023-06-30
  Filled 2022-08-24 – 2022-11-04 (×3): qty 90, 45d supply, fill #0

## 2022-08-24 NOTE — Telephone Encounter (Signed)
Call to patient but no answer. Unable to leave message due to mailbox being full. Will attempt at a later time.  Jaymee Tilson, RN

## 2022-09-02 ENCOUNTER — Encounter: Payer: Self-pay | Admitting: Internal Medicine

## 2022-09-02 ENCOUNTER — Ambulatory Visit: Payer: Medicaid Other | Admitting: Internal Medicine

## 2022-09-02 VITALS — BP 124/80 | HR 120 | Ht 67.0 in | Wt 174.4 lb

## 2022-09-02 DIAGNOSIS — K219 Gastro-esophageal reflux disease without esophagitis: Secondary | ICD-10-CM | POA: Diagnosis not present

## 2022-09-02 DIAGNOSIS — R0789 Other chest pain: Secondary | ICD-10-CM | POA: Diagnosis not present

## 2022-09-02 DIAGNOSIS — F411 Generalized anxiety disorder: Secondary | ICD-10-CM | POA: Diagnosis not present

## 2022-09-02 DIAGNOSIS — E782 Mixed hyperlipidemia: Secondary | ICD-10-CM | POA: Diagnosis not present

## 2022-09-02 DIAGNOSIS — M940 Chondrocostal junction syndrome [Tietze]: Secondary | ICD-10-CM

## 2022-09-02 HISTORY — DX: Other chest pain: R07.89

## 2022-09-02 HISTORY — DX: Chondrocostal junction syndrome (tietze): M94.0

## 2022-09-02 MED ORDER — PANTOPRAZOLE SODIUM 40 MG PO TBEC: 40.0000 mg | DELAYED_RELEASE_TABLET | Freq: Every day | ORAL | 1 refills | Status: DC

## 2022-09-02 NOTE — Progress Notes (Deleted)
Established Patient Office Visit  Subjective:  Patient ID: Samantha Nguyen, female    DOB: 1985/10/31  Age: 37 y.o. MRN: [id number]  Chief Complaint  Patient presents with   Acute Visit    Rib pain    Patient in office complaining of pain sternal area, under right breast radiating to back. Pain started after roller skating.     No other concerns at this time.   Past Medical History:  Diagnosis Date   Anxiety    Depressed    History of kidney stones    Migraine    Renal disorder     Past Surgical History:  Procedure Laterality Date   CESAREAN SECTION     CYSTOSCOPY W/ URETERAL STENT PLACEMENT Right 08/24/2016   Procedure: CYSTOSCOPY WITH RETROGRADE PYELOGRAM/URETERAL STENT PLACEMENT;  Surgeon: Malen Gauze, MD;  Location: [facility];  Service: Urology;  Laterality: Right;   CYSTOSCOPY WITH RETROGRADE PYELOGRAM, URETEROSCOPY AND STENT PLACEMENT Right 09/09/2016   Procedure: CYSTOSCOPY WITH RETROGRADE PYELOGRAM, URETEROSCOPY AND STENT REPLACEMENT;  Surgeon: Malen Gauze, MD;  Location: [facility];  Service: Urology;  Laterality: Right;   MANDIBLE SURGERY      Social History   Socioeconomic History   Marital status: Single    Spouse name: Not on file   Number of children: 1   Years of education: Not on file   Highest education level: Some college, no degree  Occupational History   Not on file  Tobacco Use   Smoking status: Former   Smokeless tobacco: Never  Vaping Use   Vaping Use: Some days   Substances: CBD  Substance and Sexual Activity   Alcohol use: Yes    Comment: occasionally   Drug use: No   Sexual activity: Not on file  Other Topics Concern   Not on file  Social History Narrative   Not on file   Social Determinants of Health   Financial Resource Strain: Not on file  Food Insecurity: Not on file  Transportation Needs: Not on file  Physical Activity: Not on file  Stress: Not on file  Social Connections: Not on file   Intimate Partner Violence: Not on file    Family History  Problem Relation Age of Onset   Hypertension Mother    Ulcerative colitis Mother    Alcohol abuse Father    Drug abuse Father    Anxiety disorder Father    Drug abuse Brother    Anxiety disorder Maternal Grandfather     Allergies  Allergen Reactions   Sulfa Antibiotics Other (See Comments)    Unknown, happened when she was a child    Review of Systems  Constitutional: Negative.   HENT: Negative.    Eyes: Negative.   Respiratory: Negative.  Negative for cough and shortness of breath.   Cardiovascular: Negative.  Negative for chest pain and palpitations.  Gastrointestinal:  Positive for heartburn. Negative for abdominal pain, constipation, diarrhea, nausea and vomiting.  Genitourinary: Negative.  Negative for dysuria.  Musculoskeletal:  Positive for back pain.  Skin: Negative.   Neurological: Negative.  Negative for dizziness and headaches.  Endo/Heme/Allergies: Negative.   Psychiatric/Behavioral: Negative.         Objective:   BP 124/80   Pulse (!) 120   Ht 5\' 7"  (1.702 m)   Wt 174 lb 6.4 oz (79.1 kg)   SpO2 98%   BMI 27.31 kg/m   Vitals:   09/02/22 1514  BP: 124/80  Pulse: (!) 120  Height: 5\' 7"  (1.702 m)  Weight: 174 lb 6.4 oz (79.1 kg)  SpO2: 98%  BMI (Calculated): 27.31    Physical Exam Vitals and nursing note reviewed.  Constitutional:      Appearance: Normal appearance.  HENT:     Head: Normocephalic and atraumatic.     Nose: Nose normal.  Cardiovascular:     Rate and Rhythm: Normal rate and regular rhythm.     Heart sounds: Normal heart sounds.  Pulmonary:     Effort: Pulmonary effort is normal.     Breath sounds: Normal breath sounds.  Abdominal:     Palpations: Abdomen is soft.  Musculoskeletal:        General: Normal range of motion.     Cervical back: Normal range of motion.  Skin:    General: Skin is warm and dry.  Neurological:     General: No focal deficit present.      Mental Status: She is alert and oriented to person, place, and time.  Psychiatric:        Mood and Affect: Mood normal.        Behavior: Behavior normal.      No results found for any visits on 09/02/22.  No results found for this or any previous visit (from the past 2160 hour(s)).    Assessment & Plan:   Problem List Items Addressed This Visit   None Visit Diagnoses     OSA (obstructive sleep apnea)    -  Primary   Relevant Orders   Ambulatory referral to Sleep Studies       No follow-ups on file.   Total time spent: {AMA time spent:29001} minutes  Margaretann Loveless, MD  09/02/2022   This document may have been prepared by [facility] Voice Recognition software and as such may include unintentional dictation errors.

## 2022-09-02 NOTE — Progress Notes (Signed)
Established Patient Office Visit  Subjective:  Patient ID: Samantha Nguyen, female    DOB: [birth year]  Age: 37 y.o. MRN: [id number]  Chief Complaint  Patient presents with   Acute Visit    Rib pain    Patient comes in right sided chest wall pain, wraps around and then has sore in mid epigastric area. Started after she was roller skating. Pain is reproducible, no shortness of breath, no cough , no fever or chills. Patient has severe anxiety - and pulse is fast today. Has some heartburn, no n/v , no diarrhea or melena. Propranolol was stopped as her pulse only gets fast when anxious.    No other concerns at this time.   Past Medical History:  Diagnosis Date   Anxiety    Depressed    History of kidney stones    Migraine    Renal disorder     Past Surgical History:  Procedure Laterality Date   CESAREAN SECTION     CYSTOSCOPY W/ URETERAL STENT PLACEMENT Right 08/24/2016   Procedure: CYSTOSCOPY WITH RETROGRADE PYELOGRAM/URETERAL STENT PLACEMENT;  Surgeon: Malen Gauze, MD;  Location: [facility];  Service: Urology;  Laterality: Right;   CYSTOSCOPY WITH RETROGRADE PYELOGRAM, URETEROSCOPY AND STENT PLACEMENT Right 09/09/2016   Procedure: CYSTOSCOPY WITH RETROGRADE PYELOGRAM, URETEROSCOPY AND STENT REPLACEMENT;  Surgeon: Malen Gauze, MD;  Location: [facility];  Service: Urology;  Laterality: Right;   MANDIBLE SURGERY      Social History   Socioeconomic History   Marital status: Single    Spouse name: Not on file   Number of children: 1   Years of education: Not on file   Highest education level: Some college, no degree  Occupational History   Not on file  Tobacco Use   Smoking status: Former   Smokeless tobacco: Never  Vaping Use   Vaping Use: Some days   Substances: CBD  Substance and Sexual Activity   Alcohol use: Yes    Comment: occasionally   Drug use: No   Sexual activity: Not on file  Other Topics Concern   Not on file  Social History  Narrative   Not on file   Social Determinants of Health   Financial Resource Strain: Not on file  Food Insecurity: Not on file  Transportation Needs: Not on file  Physical Activity: Not on file  Stress: Not on file  Social Connections: Not on file  Intimate Partner Violence: Not on file    Family History  Problem Relation Age of Onset   Hypertension Mother    Ulcerative colitis Mother    Alcohol abuse Father    Drug abuse Father    Anxiety disorder Father    Drug abuse Brother    Anxiety disorder Maternal Grandfather     Allergies  Allergen Reactions   Sulfa Antibiotics Other (See Comments)    Unknown, happened when she was a child    Review of Systems  Constitutional:  Positive for malaise/fatigue. Negative for chills, diaphoresis, fever and weight loss.  HENT: Negative.    Eyes: Negative.   Respiratory:  Negative for cough, sputum production, shortness of breath and wheezing.   Cardiovascular:  Positive for chest pain and palpitations. Negative for orthopnea, leg swelling and PND.  Gastrointestinal:  Positive for heartburn. Negative for abdominal pain, blood in stool, melena, nausea and vomiting.  Genitourinary: Negative.   Musculoskeletal: Negative.   Skin: Negative.   Neurological:  Negative for dizziness, tingling, speech change, focal  weakness, seizures, loss of consciousness and headaches.  Psychiatric/Behavioral:  The patient is nervous/anxious.        Objective:   BP 124/80   Pulse (!) 120   Ht 5\' 7"  (1.702 m)   Wt 174 lb 6.4 oz (79.1 kg)   SpO2 98%   BMI 27.31 kg/m   Vitals:   09/02/22 1514  BP: 124/80  Pulse: (!) 120  Height: 5\' 7"  (1.702 m)  Weight: 174 lb 6.4 oz (79.1 kg)  SpO2: 98%  BMI (Calculated): 27.31    Physical Exam Vitals and nursing note reviewed.  Cardiovascular:     Rate and Rhythm: Regular rhythm. Tachycardia present.     Pulses: Normal pulses.     Heart sounds: Normal heart sounds. No murmur heard.    No gallop.   Pulmonary:     Effort: Pulmonary effort is normal.     Breath sounds: No wheezing, rhonchi or rales.  Chest:     Chest wall: Tenderness present.  Abdominal:     General: Bowel sounds are normal.     Palpations: Abdomen is soft.     Tenderness: There is no right CVA tenderness or left CVA tenderness.  Musculoskeletal:        General: Normal range of motion.     Cervical back: Normal range of motion and neck supple.     Right lower leg: No edema.     Left lower leg: No edema.  Neurological:     General: No focal deficit present.     Mental Status: She is alert and oriented to person, place, and time.      No results found for any visits on 09/02/22.  No results found for this or any previous visit (from the past 2160 hour(s)).    Assessment & Plan:  EKG - unremarkable  Suspect Costochondritis- start otc Alleve bid. Rx Protonix. Repeat Lipid panel- patient had high LDL last year. Problem List Items Addressed This Visit     Generalized anxiety disorder   Other chest pain - Primary   Relevant Orders   CMP14+EGFR   Gastroesophageal reflux disease without esophagitis   Relevant Medications   pantoprazole (PROTONIX) 40 MG tablet   Other Relevant Orders   CBC With Differential   Mixed hyperlipidemia   Relevant Orders   Lipid Panel w/o Chol/HDL Ratio   Costochondritis    Return in about 1 week (around 09/09/2022).   Total time spent: 30 minutes  Margaretann Loveless, MD  09/02/2022   This document may have been prepared by [facility] Voice Recognition software and as such may include unintentional dictation errors.

## 2022-09-03 ENCOUNTER — Other Ambulatory Visit: Payer: Self-pay

## 2022-09-03 ENCOUNTER — Other Ambulatory Visit: Payer: Self-pay | Admitting: Internal Medicine

## 2022-09-03 DIAGNOSIS — R0789 Other chest pain: Secondary | ICD-10-CM

## 2022-09-03 LAB — CMP14+EGFR
ALT: 14 IU/L (ref 0–32)
AST: 18 IU/L (ref 0–40)
Albumin: 4.8 g/dL (ref 3.9–4.9)
Alkaline Phosphatase: 73 IU/L (ref 44–121)
BUN/Creatinine Ratio: 13 (ref 9–23)
BUN: 11 mg/dL (ref 6–20)
Bilirubin Total: 0.5 mg/dL (ref 0.0–1.2)
CO2: 19 mmol/L — ABNORMAL LOW (ref 20–29)
Calcium: 10.2 mg/dL (ref 8.7–10.2)
Chloride: 104 mmol/L (ref 96–106)
Creatinine, Ser: 0.88 mg/dL (ref 0.57–1.00)
Globulin, Total: 2.7 g/dL (ref 1.5–4.5)
Glucose: 88 mg/dL (ref 70–99)
Potassium: 4.2 mmol/L (ref 3.5–5.2)
Sodium: 140 mmol/L (ref 134–144)
Total Protein: 7.5 g/dL (ref 6.0–8.5)
eGFR: 87 mL/min/{1.73_m2} (ref >59)

## 2022-09-03 LAB — CBC WITH DIFFERENTIAL
Basophils Absolute: 0 10*3/uL (ref 0.0–0.2)
Basos: 0 %
EOS (ABSOLUTE): 0.1 10*3/uL (ref 0.0–0.4)
Eos: 1 %
Hematocrit: 41 % (ref 34.0–46.6)
Hemoglobin: 14 g/dL (ref 11.1–15.9)
Immature Grans (Abs): 0 10*3/uL (ref 0.0–0.1)
Immature Granulocytes: 0 %
Lymphocytes Absolute: 2.8 10*3/uL (ref 0.7–3.1)
Lymphs: 30 %
MCH: 28.4 pg (ref 26.6–33.0)
MCHC: 34.1 g/dL (ref 31.5–35.7)
MCV: 83 fL (ref 79–97)
Monocytes Absolute: 0.6 10*3/uL (ref 0.1–0.9)
Monocytes: 7 %
Neutrophils Absolute: 5.9 10*3/uL (ref 1.4–7.0)
Neutrophils: 62 %
RBC: 4.93 x10E6/uL (ref 3.77–5.28)
RDW: 11.3 % — ABNORMAL LOW (ref 11.7–15.4)
WBC: 9.4 10*3/uL (ref 3.4–10.8)

## 2022-09-03 LAB — LIPID PANEL W/O CHOL/HDL RATIO
Cholesterol, Total: 230 mg/dL — ABNORMAL HIGH (ref 100–199)
HDL: 57 mg/dL (ref >39)
LDL Chol Calc (NIH): 157 mg/dL — ABNORMAL HIGH (ref 0–99)
Triglycerides: 92 mg/dL (ref 0–149)
VLDL Cholesterol Cal: 16 mg/dL (ref 5–40)

## 2022-09-03 MED ORDER — ROSUVASTATIN CALCIUM 10 MG PO TABS: 10.0000 mg | ORAL_TABLET | Freq: Every day | ORAL | 3 refills | Status: DC

## 2022-09-04 ENCOUNTER — Other Ambulatory Visit: Payer: Self-pay

## 2022-09-06 ENCOUNTER — Telehealth: Payer: Self-pay | Admitting: Physician Assistant

## 2022-09-06 DIAGNOSIS — B9689 Other specified bacterial agents as the cause of diseases classified elsewhere: Secondary | ICD-10-CM

## 2022-09-06 DIAGNOSIS — J019 Acute sinusitis, unspecified: Secondary | ICD-10-CM

## 2022-09-06 MED ORDER — AMOXICILLIN-POT CLAVULANATE 875-125 MG PO TABS: 1.0000 | ORAL_TABLET | Freq: Two times a day (BID) | ORAL | 0 refills | Status: DC

## 2022-09-06 NOTE — Progress Notes (Signed)

## 2022-09-07 ENCOUNTER — Ambulatory Visit: Payer: Medicaid Other | Admitting: Internal Medicine

## 2022-09-07 NOTE — Progress Notes (Signed)
Patient was informed via mychart of these and medication was snt

## 2022-09-08 ENCOUNTER — Telehealth: Payer: Self-pay | Admitting: Physician Assistant

## 2022-09-08 DIAGNOSIS — J019 Acute sinusitis, unspecified: Secondary | ICD-10-CM

## 2022-09-08 DIAGNOSIS — B9689 Other specified bacterial agents as the cause of diseases classified elsewhere: Secondary | ICD-10-CM

## 2022-09-08 DIAGNOSIS — T50905A Adverse effect of unspecified drugs, medicaments and biological substances, initial encounter: Secondary | ICD-10-CM

## 2022-09-08 DIAGNOSIS — R21 Rash and other nonspecific skin eruption: Secondary | ICD-10-CM

## 2022-09-09 ENCOUNTER — Ambulatory Visit
Admission: EM | Admit: 2022-09-09 | Discharge: 2022-09-09 | Disposition: A | Discharge status: Home / Self Care | Payer: Medicaid Other

## 2022-09-09 DIAGNOSIS — S7012XA Contusion of left thigh, initial encounter: Secondary | ICD-10-CM | POA: Diagnosis not present

## 2022-09-09 MED ORDER — DOXYCYCLINE HYCLATE 100 MG PO TABS: 100.0000 mg | ORAL_TABLET | Freq: Two times a day (BID) | ORAL | 0 refills | Status: DC

## 2022-09-09 NOTE — Progress Notes (Signed)
E Visit for Rash  We are sorry that you are not feeling well. Here is how we plan to help!  Based on what you shared with me you may have an allergic reaction.    I recommend you take Benadryl 25 mg - 50 mg every 4 hours to control the symptoms but if they last over 24 hours it is best that you see an office based provider for follow up.  You can also apply topical hydrocortisone for any itching.  STOP Augmentin. Will send in Doxycycline to replace for sinus symptoms.   HOME CARE:  Take cool showers and avoid direct sunlight. Apply cool compress or wet dressings. Take a bath in an oatmeal bath.  Sprinkle content of one Aveeno packet under running faucet with comfortably warm water.  Bathe for 15-20 minutes, 1-2 times daily.  Pat dry with a towel. Do not rub the rash. Use hydrocortisone cream. Take an antihistamine like Benadryl for widespread rashes that itch.  The adult dose of Benadryl is 25-50 mg by mouth 4 times daily. Caution:  This type of medication may cause sleepiness.  Do not drink alcohol, drive, or operate dangerous machinery while taking antihistamines.  Do not take these medications if you have prostate enlargement.  Read package instructions thoroughly on all medications that you take.  GET HELP RIGHT AWAY IF:  Symptoms don't go away after treatment. Severe itching that persists. If you rash spreads or swells. If you rash begins to smell. If it blisters and opens or develops a yellow-brown crust. You develop a fever. You have a sore throat. You become short of breath.  MAKE SURE YOU:  Understand these instructions. Will watch your condition. Will get help right away if you are not doing well or get worse.  Thank you for choosing an e-visit.  Your e-visit answers were reviewed by a board certified advanced clinical practitioner to complete your personal care plan. Depending upon the condition, your plan could have included both over the counter or prescription  medications.  Please review your pharmacy choice. Make sure the pharmacy is open so you can pick up prescription now. If there is a problem, you may contact your provider through [employer] and have the prescription routed to another pharmacy.  Your safety is important to [name]. If you have drug allergies check your prescription carefully.   For the next 24 hours you can use MyChart to ask questions about today's visit, request a non-urgent call back, or ask for a work or school excuse. You will get an email in the next two days asking about your experience. I hope that your e-visit has been valuable and will speed your recovery.  I have spent 5 minutes in review of e-visit questionnaire, review and updating patient chart, medical decision making and response to patient.   Margaretann Loveless, PA-C

## 2022-09-09 NOTE — Discharge Instructions (Signed)
Follow up here or with your primary care provider if your symptoms are worsening or not improving.     

## 2022-09-09 NOTE — ED Provider Notes (Signed)
Samantha Nguyen    CSN: [id number] Arrival date & time: 09/09/22  1552      History   Chief Complaint Chief Complaint  Patient presents with   Rash    HPI Samantha Nguyen is a 37 y.o. female.    Rash   Presents to urgent care with rash to groin area.  Patient states had ED visit today and prescribed antibiotics for presumed sinus infection (reported symptoms x 1 day) and instructed to take Benadryl for presumed allergic reaction to Augmentin.  She endorses history of easy bruising as well as hypermobility syndrome.  Past Medical History:  Diagnosis Date   Anxiety    Depressed    History of kidney stones    Migraine    Renal disorder     Patient Active Problem List   Diagnosis Date Noted   Other chest pain 09/02/2022   Gastroesophageal reflux disease without esophagitis 09/02/2022   Mixed hyperlipidemia 09/02/2022   Costochondritis 09/02/2022   Elevated blood pressure reading 07/27/2022   Tension-type headache, not intractable 07/27/2022   MDD (major depressive disorder), recurrent episode, moderate (HCC) 05/02/2022   Generalized anxiety disorder 05/02/2022   Illness anxiety disorder 04/28/2022   MDD (major depressive disorder), single episode, severe (HCC) 04/28/2022   Sepsis due to urinary tract infection (HCC) 08/24/2016    Past Surgical History:  Procedure Laterality Date   CESAREAN SECTION     CYSTOSCOPY W/ URETERAL STENT PLACEMENT Right 08/24/2016   Procedure: CYSTOSCOPY WITH RETROGRADE PYELOGRAM/URETERAL STENT PLACEMENT;  Surgeon: Malen Gauze, MD;  Location: [facility];  Service: Urology;  Laterality: Right;   CYSTOSCOPY WITH RETROGRADE PYELOGRAM, URETEROSCOPY AND STENT PLACEMENT Right 09/09/2016   Procedure: CYSTOSCOPY WITH RETROGRADE PYELOGRAM, URETEROSCOPY AND STENT REPLACEMENT;  Surgeon: Malen Gauze, MD;  Location: [facility];  Service: Urology;  Laterality: Right;   MANDIBLE SURGERY      OB History   No obstetric  history on file.      Home Medications    Prior to Admission medications   Medication Sig Start Date End Date Taking? Authorizing Provider  rosuvastatin (CRESTOR) 10 MG tablet Take 1 tablet (10 mg total) by mouth daily. 09/03/22   Margaretann Loveless, MD  ALPRAZolam Prudy Feeler) 0.5 MG tablet Take 1 tablet (0.5 mg total) by mouth 2 (two) times daily as needed for sleep or anxiety. 07/27/22 07/27/23  Orson Eva, NP  doxycycline (VIBRA-TABS) 100 MG tablet Take 1 tablet (100 mg total) by mouth 2 (two) times daily. 09/09/22   Margaretann Loveless, PA-C  pantoprazole (PROTONIX) 40 MG tablet Take 1 tablet (40 mg total) by mouth daily. 09/02/22 09/02/23  Margaretann Loveless, MD  PARoxetine (PAXIL) 20 MG tablet Take 1 tablet (20 mg total) by mouth daily. 05/18/22   Bobbye Morton, MD    Family History Family History  Problem Relation Age of Onset   Hypertension Mother    Ulcerative colitis Mother    Alcohol abuse Father    Drug abuse Father    Anxiety disorder Father    Drug abuse Brother    Anxiety disorder Maternal Grandfather     Social History Social History   Tobacco Use   Smoking status: Former   Smokeless tobacco: Never  Building services engineer Use: Some days   Substances: CBD  Substance Use Topics   Alcohol use: Yes    Comment: occasionally   Drug use: No     Allergies   Sulfa antibiotics  Review of Systems Review of Systems  Skin:  Positive for rash.     Physical Exam Triage Vital Signs ED Triage Vitals [09/09/22 1619]  Enc Vitals Group     BP (!) 147/92     Pulse Rate (S) (!) 114     Resp 18     Temp 98.8 F (37.1 C)     Temp Source Temporal     SpO2 98 %     Weight      Height      Head Circumference      Peak Flow      Pain Score      Pain Loc      Pain Edu?      Excl. in GC?    No data found.  Updated Vital Signs BP (!) 147/92 (BP Location: Left Arm)   Pulse (S) (!) 114 Comment: anxiety, crying  Temp 98.8 F (37.1 C) (Temporal)   Resp 18   SpO2 98%    Visual Acuity Right Eye Distance:   Left Eye Distance:   Bilateral Distance:    Right Eye Near:   Left Eye Near:    Bilateral Near:     Physical Exam Vitals reviewed.  Constitutional:      Appearance: Normal appearance.  Skin:    General: Skin is warm and dry.  Neurological:     General: No focal deficit present.     Mental Status: She is alert and oriented to person, place, and time.  Psychiatric:        Mood and Affect: Mood is anxious. Affect is tearful.        Behavior: Behavior normal.      UC Treatments / Results  Labs (all labs ordered are listed, but only abnormal results are displayed) Labs Reviewed - No data to display  EKG   Radiology No results found.  Procedures Procedures (including critical care time)  Medications Ordered in UC Medications - No data to display  Initial Impression / Assessment and Plan / UC Course  I have reviewed the triage vital signs and the nursing notes.  Pertinent labs & imaging results that were available during my care of the patient were reviewed by me and considered in my medical decision making (see chart for details).   She has a softball size area of erythema, nonblanchable, to her left anterior upper thigh.  The center of the lesion is darker in color and pinpoint petechiae are visible on close observation.  Suspect patient has a bruise from unknown "trauma".  She endorses easily bruising.  Provided patient reassurance that the likelihood that she had a more serious condition including feared cellulitis (there is no firmness of tissue), allergic reaction (there is a single circular lesion, nonblanchable), necrosis, is very unlikely.  Encourage patient to continue to use cold therapy on the wound for another day and then start using heat to encourage blood flow and healing.  Final Clinical Impressions(s) / UC Diagnoses   Final diagnoses:  None   Discharge Instructions   None    ED Prescriptions   None     PDMP not reviewed this encounter.   Charma Igo, [place] 09/09/22 1650

## 2022-09-09 NOTE — ED Triage Notes (Signed)
Patient presents to UC for rash to groin area. Had e-visit today. Prescribed antibiotics and instructed to take benadryl.

## 2022-09-15 ENCOUNTER — Other Ambulatory Visit: Payer: Self-pay

## 2022-09-17 ENCOUNTER — Telehealth: Payer: Self-pay | Admitting: Internal Medicine

## 2022-09-17 NOTE — Telephone Encounter (Unsigned)
Copied from CRM #[id number]. Topic: Access to Care - Speak to Provider/Office Staff  >> Sep 17, 2022 12:59 PM Sharlett Iles S wrote:  Patient is calling to ask if the office has a copy of her birth certificate    Patient can be reached at [phone]

## 2022-09-17 NOTE — Telephone Encounter (Signed)
Called and spoke with patient. Patient aware that office does not have a copy of birth certificate on file.

## 2022-09-29 ENCOUNTER — Telehealth: Payer: MEDICAID | Admitting: Nurse Practitioner

## 2022-09-29 DIAGNOSIS — F419 Anxiety disorder, unspecified: Secondary | ICD-10-CM | POA: Diagnosis not present

## 2022-09-29 NOTE — Progress Notes (Signed)
Virtual Visit Consent   Samantha Nguyen, you are scheduled for a virtual visit with a [facility] provider today. Just as with appointments in the office, your consent must be obtained to participate. Your consent will be active for this visit and any virtual visit you may have with one of our providers in the next 365 days. If you have a MyChart account, a copy of this consent can be sent to you electronically.  As this is a virtual visit, video technology does not allow for your provider to perform a traditional examination. This may limit your provider's ability to fully assess your condition. If your provider identifies any concerns that need to be evaluated in person or the need to arrange testing (such as labs, EKG, etc.), we will make arrangements to do so. Although advances in technology are sophisticated, we cannot ensure that it will always work on either your end or our end. If the connection with a video visit is poor, the visit may have to be switched to a telephone visit. With either a video or telephone visit, we are not always able to ensure that we have a secure connection.  By engaging in this virtual visit, you consent to the provision of healthcare and authorize for your insurance to be billed (if applicable) for the services provided during this visit. Depending on your insurance coverage, you may receive a charge related to this service.  I need to obtain your verbal consent now. Are you willing to proceed with your visit today? Samantha Nguyen has provided verbal consent on 09/29/2022 for a virtual visit (video or telephone). Viviano Simas, FNP  Date: 09/29/2022 1:17 PM  Virtual Visit via Video Note   I, Viviano Simas, connected with  Samantha Nguyen  ([id number], 1985-08-23) on 09/29/22 at  1:15 PM EDT by a video-enabled telemedicine application and verified that I am speaking with the correct person using two identifiers.  Location: Patient: Virtual Visit Location Patient: Home Provider:  Virtual Visit Location Provider: Home Office   I discussed the limitations of evaluation and management by telemedicine and the availability of in person appointments. The patient expressed understanding and agreed to proceed.    History of Present Illness: Samantha Nguyen is a 37 y.o. who identifies as a female who was assigned female at birth, and is being seen today for anxiety and stress  Her job recently ended and she has been home a lot more  Her cat is dying and she is very upset about that   She is scared she herself is going to die  She is scared something bad is going to happen   She takes Paxil daily and uses Xanax as needed Restarted Paxil in March (she has been on and off that medicine for years) She took a xanax this morning and did not feel relief so she took another 1/2 pill and started to become more anxious that she was going to die due to use of medicine   Her stomach hurts today   She goes to [facility] for medical management of her Paxil and Xanax   She is awaiting a new therapist currently via her case worker  She was in a zoom group therapy that ended in March  This was initiated after an ER visit when she did have thoughts of self harm   She was most recently seen at the [facility] on 09/09/22 and did have an ECG performed on 09/02/22 when she saw her PCP  Also had labs  done on 09/02/22 Overall unremarkable    She lives with her husband he is currently at work  She feels safe in her relationship  She feels safe in her home  She denies any SI/HI  She states that she does not want to die and wants to make sure she is not dying.     Problems:  Patient Active Problem List   Diagnosis Date Noted   Other chest pain 09/02/2022   Gastroesophageal reflux disease without esophagitis 09/02/2022   Mixed hyperlipidemia 09/02/2022   Costochondritis 09/02/2022   Elevated blood pressure reading 07/27/2022   Tension-type headache, not intractable 07/27/2022   MDD (major  depressive disorder), recurrent episode, moderate (HCC) 05/02/2022   Generalized anxiety disorder 05/02/2022   Illness anxiety disorder 04/28/2022   MDD (major depressive disorder), single episode, severe (HCC) 04/28/2022   Sepsis due to urinary tract infection (HCC) 08/24/2016    Allergies:  Allergies  Allergen Reactions   Sulfa Antibiotics Other (See Comments)    Unknown, happened when she was a child   Medications:  Current Outpatient Medications:    rosuvastatin (CRESTOR) 10 MG tablet, Take 1 tablet (10 mg total) by mouth daily., Disp: 90 tablet, Rfl: 3   ALPRAZolam (XANAX) 0.5 MG tablet, Take 1 tablet (0.5 mg total) by mouth 2 (two) times daily as needed for sleep or anxiety., Disp: 10 tablet, Rfl: 0   doxycycline (VIBRA-TABS) 100 MG tablet, Take 1 tablet (100 mg total) by mouth 2 (two) times daily., Disp: 20 tablet, Rfl: 0   pantoprazole (PROTONIX) 40 MG tablet, Take 1 tablet (40 mg total) by mouth daily., Disp: 30 tablet, Rfl: 1   PARoxetine (PAXIL) 20 MG tablet, Take 1 tablet (20 mg total) by mouth daily., Disp: 30 tablet, Rfl: 0  Recent Results (from the past 2160 hour(s))  CBC With Differential     Status: Abnormal   Collection Time: 09/02/22  4:00 PM  Result Value Ref Range   WBC 9.4 3.4 - 10.8 x10E3/uL   RBC 4.93 3.77 - 5.28 x10E6/uL   Hemoglobin 14.0 11.1 - 15.9 g/dL   Hematocrit 82.9 56.2 - 46.6 %   MCV 83 79 - 97 fL   MCH 28.4 26.6 - 33.0 pg   MCHC 34.1 31.5 - 35.7 g/dL   RDW 13.0 (L) 86.5 - 78.4 %   Neutrophils 62 Not Estab. %   Lymphs 30 Not Estab. %   Monocytes 7 Not Estab. %   Eos 1 Not Estab. %   Basos 0 Not Estab. %   Neutrophils Absolute 5.9 1.4 - 7.0 x10E3/uL   Lymphocytes Absolute 2.8 0.7 - 3.1 x10E3/uL   Monocytes Absolute 0.6 0.1 - 0.9 x10E3/uL   EOS (ABSOLUTE) 0.1 0.0 - 0.4 x10E3/uL   Basophils Absolute 0.0 0.0 - 0.2 x10E3/uL   Immature Granulocytes 0 Not Estab. %   Immature Grans (Abs) 0.0 0.0 - 0.1 x10E3/uL    Comment: **Effective October 11, 2022, profile [id number] CBC/Differential**   (No Platelet) will be made non-orderable. [facility] Offers:   [id number] CBC With Differential/Platelet   Lipid Panel w/o Chol/HDL Ratio     Status: Abnormal   Collection Time: 09/02/22  4:00 PM  Result Value Ref Range   Cholesterol, Total 230 (H) 100 - 199 mg/dL   Triglycerides 92 0 - 149 mg/dL   HDL 57 >28 mg/dL   VLDL Cholesterol Cal 16 5 - 40 mg/dL   LDL Chol Calc (NIH) 413 (H) 0 - 99 mg/dL  [id number]  Status: Abnormal   Collection Time: 09/02/22  4:00 PM  Result Value Ref Range   Glucose 88 70 - 99 mg/dL   BUN 11 6 - 20 mg/dL   Creatinine, Ser 6.57 0.57 - 1.00 mg/dL   eGFR 87 >84 ON/GEX/5.28   BUN/Creatinine Ratio 13 9 - 23   Sodium 140 134 - 144 mmol/L   Potassium 4.2 3.5 - 5.2 mmol/L   Chloride 104 96 - 106 mmol/L   CO2 19 (L) 20 - 29 mmol/L   Calcium 10.2 8.7 - 10.2 mg/dL   Total Protein 7.5 6.0 - 8.5 g/dL   Albumin 4.8 3.9 - 4.9 g/dL   Globulin, Total 2.7 1.5 - 4.5 g/dL   Bilirubin Total 0.5 0.0 - 1.2 mg/dL   Alkaline Phosphatase 73 44 - 121 IU/L   AST 18 0 - 40 IU/L   ALT 14 0 - 32 IU/L    Observations/Objective: Patient is well-developed, well-nourished in no acute distress.  Resting comfortably  at home.  Head is normocephalic, atraumatic.  No labored breathing.  Speech is clear and coherent with logical content.  Patient is alert and oriented at baseline.    Assessment and Plan:  1. Anxiety Established that she is safe without thoughts of self harm at this time  Sent resources to patient including [facility]  Discussed symptoms of anxiety and plans to get reestablished with a therapist  She will also touch base with her prescribing MD regarding her worsening anxiety       Follow Up Instructions: I discussed the assessment and treatment plan with the patient. The patient was provided an opportunity to ask questions and all were answered. The patient agreed with the plan and demonstrated an understanding of  the instructions.  A copy of instructions were sent to the patient via MyChart unless otherwise noted below.    The patient was advised to call back or seek an in-person evaluation if the symptoms worsen or if the condition fails to improve as anticipated.  Time:  I spent 20 minutes with the patient via telehealth technology discussing the above problems/concerns.    Viviano Simas, FNP

## 2022-09-29 NOTE — Patient Instructions (Addendum)
Follow up with case worker on new therapist   Call [facility] to set up follow up to talk about dosage adjustment of Paxil  Follow up with primary care if new referral to therapist is needed Calm app   For crisis situations please call or visit the [facility]  [phone]  [address]  [place], [name] [address]   Call our 24-hour HelpLine at [phone] or [phone]

## 2022-09-30 ENCOUNTER — Ambulatory Visit
Admission: EM | Admit: 2022-09-30 | Discharge: 2022-09-30 | Disposition: A | Discharge status: Home / Self Care | Payer: MEDICAID

## 2022-09-30 DIAGNOSIS — R1084 Generalized abdominal pain: Secondary | ICD-10-CM

## 2022-09-30 DIAGNOSIS — F419 Anxiety disorder, unspecified: Secondary | ICD-10-CM | POA: Diagnosis not present

## 2022-09-30 DIAGNOSIS — R000 Tachycardia, unspecified: Secondary | ICD-10-CM

## 2022-09-30 NOTE — ED Triage Notes (Signed)
Patient presents to UC for diarrhea and nausea this morning. Anxious since Saturday. Treating with her paxil, xanax not helping. States she is upset due to having to put her cat down tomorrow.

## 2022-09-30 NOTE — ED Notes (Signed)
Patient is being discharged from the Urgent Care and sent to the Emergency Department via POV with mom. Per Arlana Pouch, NP patient is in need of higher level of care due to panic attack. Patient is aware and verbalizes understanding of plan of care.  Vitals:   09/30/22 1353  BP: (!) 143/85  Pulse: (S) (!) 130  Resp: 18  Temp: 98.6 F (37 C)  SpO2: 99%

## 2022-09-30 NOTE — ED Provider Notes (Signed)
[name]    CSN: [id number] Arrival date & time: 09/30/22  1343      History   Chief Complaint Chief Complaint  Patient presents with   Panic Attack    HPI Samantha Nguyen is a 37 y.o. female.  Accompanied by her mother, patient presents with anxiety and panic attack x 2 days.  She is tearful stating that she is afraid that she is going to die.  She has history of depression and anxiety which are followed by behavioral health.  She denies suicidal or homicidal ideation.  She is upset that her cat is dying.  She reports nausea, diarrhea, and abdominal pain this morning.  No emesis or fever.  Patient had a telehealth visit yesterday; diagnosed with anxiety; instructed to follow-up with her primary care or her therapist.    The history is provided by the patient, medical records and a parent.    Past Medical History:  Diagnosis Date   Anxiety    Depressed    History of kidney stones    Migraine    Renal disorder     Patient Active Problem List   Diagnosis Date Noted   Other chest pain 09/02/2022   Gastroesophageal reflux disease without esophagitis 09/02/2022   Mixed hyperlipidemia 09/02/2022   Costochondritis 09/02/2022   Elevated blood pressure reading 07/27/2022   Tension-type headache, not intractable 07/27/2022   MDD (major depressive disorder), recurrent episode, moderate (HCC) 05/02/2022   Generalized anxiety disorder 05/02/2022   Illness anxiety disorder 04/28/2022   MDD (major depressive disorder), single episode, severe (HCC) 04/28/2022   Sepsis due to urinary tract infection (HCC) 08/24/2016    Past Surgical History:  Procedure Laterality Date   CESAREAN SECTION     CYSTOSCOPY W/ URETERAL STENT PLACEMENT Right 08/24/2016   Procedure: CYSTOSCOPY WITH RETROGRADE PYELOGRAM/URETERAL STENT PLACEMENT;  Surgeon: Malen Gauze, MD;  Location: [facility];  Service: Urology;  Laterality: Right;   CYSTOSCOPY WITH RETROGRADE PYELOGRAM, URETEROSCOPY AND STENT  PLACEMENT Right 09/09/2016   Procedure: CYSTOSCOPY WITH RETROGRADE PYELOGRAM, URETEROSCOPY AND STENT REPLACEMENT;  Surgeon: Malen Gauze, MD;  Location: [facility];  Service: Urology;  Laterality: Right;   MANDIBLE SURGERY      OB History   No obstetric history on file.      Home Medications    Prior to Admission medications   Medication Sig Start Date End Date Taking? Authorizing Provider  rosuvastatin (CRESTOR) 10 MG tablet Take 1 tablet (10 mg total) by mouth daily. 09/03/22   Margaretann Loveless, MD  ALPRAZolam Prudy Feeler) 0.5 MG tablet Take 1 tablet (0.5 mg total) by mouth 2 (two) times daily as needed for sleep or anxiety. 07/27/22 07/27/23  Orson Eva, NP  doxycycline (VIBRA-TABS) 100 MG tablet Take 1 tablet (100 mg total) by mouth 2 (two) times daily. 09/09/22   Margaretann Loveless, PA-C  pantoprazole (PROTONIX) 40 MG tablet Take 1 tablet (40 mg total) by mouth daily. 09/02/22 09/02/23  Margaretann Loveless, MD  PARoxetine (PAXIL) 20 MG tablet Take 1 tablet (20 mg total) by mouth daily. 05/18/22   Bobbye Morton, MD    Family History Family History  Problem Relation Age of Onset   Hypertension Mother    Ulcerative colitis Mother    Alcohol abuse Father    Drug abuse Father    Anxiety disorder Father    Drug abuse Brother    Anxiety disorder Maternal Grandfather     Social History Social History  Tobacco Use   Smoking status: Former   Smokeless tobacco: Never  Vaping Use   Vaping status: Some Days   Substances: CBD  Substance Use Topics   Alcohol use: Yes    Comment: occasionally   Drug use: No     Allergies   Sulfa antibiotics   Review of Systems Review of Systems  Constitutional:  Negative for chills and fever.  Respiratory:  Negative for cough and shortness of breath.   Cardiovascular:  Negative for chest pain and palpitations.  Gastrointestinal:  Positive for abdominal pain, diarrhea and nausea. Negative for vomiting.   Psychiatric/Behavioral:  Positive for dysphoric mood. Negative for self-injury and suicidal ideas. The patient is nervous/anxious.      Physical Exam Triage Vital Signs ED Triage Vitals  Encounter Vitals Group     BP      Systolic BP Percentile      Diastolic BP Percentile      Pulse      Resp      Temp      Temp src      SpO2      Weight      Height      Head Circumference      Peak Flow      Pain Score      Pain Loc      Pain Education      Exclude from Growth Chart    No data found.  Updated Vital Signs BP (!) 143/85 (BP Location: Left Arm)   Pulse (S) (!) 130   Temp 98.6 F (37 C) (Temporal)   Resp 18   LMP 09/12/2022 (Approximate)   SpO2 99%   Visual Acuity Right Eye Distance:   Left Eye Distance:   Bilateral Distance:    Right Eye Near:   Left Eye Near:    Bilateral Near:     Physical Exam HENT:     Mouth/Throat:     Mouth: Mucous membranes are moist.  Cardiovascular:     Rate and Rhythm: Regular rhythm. Tachycardia present.     Heart sounds: Normal heart sounds.  Pulmonary:     Effort: Pulmonary effort is normal. No respiratory distress.     Breath sounds: Normal breath sounds.  Abdominal:     General: Bowel sounds are normal.     Palpations: Abdomen is soft.     Tenderness: There is no abdominal tenderness. There is no guarding or rebound.  Neurological:     Mental Status: She is alert.  Psychiatric:        Mood and Affect: Mood is anxious and depressed. Affect is tearful.        Behavior: Behavior is not agitated, aggressive or combative.        Thought Content: Thought content does not include homicidal or suicidal ideation. Thought content does not include homicidal or suicidal plan.      UC Treatments / Results  Labs (all labs ordered are listed, but only abnormal results are displayed) Labs Reviewed - No data to display  EKG   Radiology No results found.  Procedures Procedures (including critical care time)  Medications  Ordered in UC Medications - No data to display  Initial Impression / Assessment and Plan / UC Course  I have reviewed the triage vital signs and the nursing notes.  Pertinent labs & imaging results that were available during my care of the patient were reviewed by me and considered in my medical decision making (see  chart for details).    Anxiety, generalized abdominal pain, tachycardia.  Patient is tearful and anxious stating she is afraid she is going to die.  She is afraid she is going to die from her abdominal pain and elevated heart rate.  She is upset that her cat is dying.  She has history of anxiety and has been seen in the emergency department as well as behavioral health.  She is accompanied by her mother today.  Her mother will take her to the emergency department or to the behavioral health urgent care.  Patient denies suicidal or homicidal ideation.  She agrees to go for additional evaluation in the ED or behavioral health urgent care.  Final Clinical Impressions(s) / UC Diagnoses   Final diagnoses:  Anxiety  Generalized abdominal pain  Tachycardia     Discharge Instructions      Go to the emergency department or to  [facility] [address] [place], [name] [address]  [phone]        ED Prescriptions   None    PDMP not reviewed this encounter.   Mickie Bail, NP 09/30/22 1416

## 2022-09-30 NOTE — Discharge Instructions (Addendum)
Go to the emergency department or to  [facility] [address], [name] [address]  [phone]

## 2022-10-04 ENCOUNTER — Other Ambulatory Visit: Payer: Self-pay

## 2022-10-04 ENCOUNTER — Emergency Department (HOSPITAL_COMMUNITY)
Admission: EM | Admit: 2022-10-04 | Discharge: 2022-10-04 | Disposition: A | Discharge status: Home / Self Care | Payer: MEDICAID | Attending: Student | Admitting: Student

## 2022-10-04 ENCOUNTER — Emergency Department (HOSPITAL_COMMUNITY): Payer: MEDICAID

## 2022-10-04 ENCOUNTER — Encounter (HOSPITAL_COMMUNITY): Payer: Self-pay

## 2022-10-04 ENCOUNTER — Ambulatory Visit (HOSPITAL_COMMUNITY)
Admission: EM | Admit: 2022-10-04 | Discharge: 2022-10-04 | Disposition: A | Discharge status: Home / Self Care | Payer: MEDICAID | Attending: Family | Admitting: Family

## 2022-10-04 DIAGNOSIS — F411 Generalized anxiety disorder: Secondary | ICD-10-CM | POA: Insufficient documentation

## 2022-10-04 DIAGNOSIS — R000 Tachycardia, unspecified: Secondary | ICD-10-CM | POA: Insufficient documentation

## 2022-10-04 DIAGNOSIS — R079 Chest pain, unspecified: Secondary | ICD-10-CM

## 2022-10-04 DIAGNOSIS — R1110 Vomiting, unspecified: Secondary | ICD-10-CM | POA: Insufficient documentation

## 2022-10-04 LAB — CBC
HCT: 41.9 % (ref 36.0–46.0)
Hemoglobin: 13.8 g/dL (ref 12.0–15.0)
MCH: 28 pg (ref 26.0–34.0)
MCHC: 32.9 g/dL (ref 30.0–36.0)
MCV: 85.2 fL (ref 80.0–100.0)
Platelets: 374 10*3/uL (ref 150–400)
RBC: 4.92 MIL/uL (ref 3.87–5.11)
RDW: 12.4 % (ref 11.5–15.5)
WBC: 6.2 10*3/uL (ref 4.0–10.5)
nRBC: 0 % (ref 0.0–0.2)

## 2022-10-04 LAB — BASIC METABOLIC PANEL
Anion gap: 12 (ref 5–15)
BUN: 8 mg/dL (ref 6–20)
CO2: 21 mmol/L — ABNORMAL LOW (ref 22–32)
Calcium: 10.4 mg/dL — ABNORMAL HIGH (ref 8.9–10.3)
Chloride: 104 mmol/L (ref 98–111)
Creatinine, Ser: 0.88 mg/dL (ref 0.44–1.00)
GFR, Estimated: >60 mL/min (ref >60)
Glucose, Bld: 112 mg/dL — ABNORMAL HIGH (ref 70–99)
Potassium: 3.4 mmol/L — ABNORMAL LOW (ref 3.5–5.1)
Sodium: 137 mmol/L (ref 135–145)

## 2022-10-04 LAB — TROPONIN I (HIGH SENSITIVITY)
Troponin I (High Sensitivity): 3 ng/L (ref <18)
Troponin I (High Sensitivity): 2 ng/L (ref <18)

## 2022-10-04 LAB — D-DIMER, QUANTITATIVE: D-Dimer, Quant: 0.78 ug/mL-FEU — ABNORMAL HIGH (ref 0.00–0.50)

## 2022-10-04 LAB — PREGNANCY, URINE: Preg Test, Ur: NEGATIVE

## 2022-10-04 MED ORDER — LORAZEPAM 1 MG PO TABS
1.0000 mg | ORAL_TABLET | Freq: Once | ORAL | Status: AC
  Administered 2022-10-04: 1 mg via ORAL
  Filled 2022-10-04: qty 1

## 2022-10-04 MED ORDER — LORAZEPAM 1 MG PO TABS: 1.0000 mg | ORAL_TABLET | Freq: Once | ORAL | Status: DC

## 2022-10-04 MED ORDER — HYDROXYZINE HCL 25 MG PO TABS
50.0000 mg | ORAL_TABLET | Freq: Once | ORAL | Status: AC
  Administered 2022-10-04: 50 mg via ORAL
  Filled 2022-10-04: qty 2

## 2022-10-04 MED ORDER — IOHEXOL 350 MG/ML SOLN
75.0000 mL | Freq: Once | INTRAVENOUS | Status: AC | PRN
  Administered 2022-10-04: 75 mL via INTRAVENOUS

## 2022-10-04 NOTE — Discharge Instructions (Addendum)
and was reassuring, you have no evidence of a blood clot in your lungs.  And your heart enzymes were negative.  Return to the ER if you feel like your symptoms are getting worse.  I recommend that you follow-up with your primary care doctor or your psychiatrist and regarding to your anxiety.

## 2022-10-04 NOTE — ED Provider Notes (Signed)
[place] EMERGENCY DEPARTMENT AT [facility] Provider Note   CSN: [id number] Arrival date & time: 10/04/22  1346     History  Chief Complaint  Patient presents with   Chest Pain    Samantha Nguyen is a 37 y.o. female, history of anxiety, who presents to the ED secondary to chest pain has been going on this morning.  She states very anxious, with any, stabbing chest pain, with some shortness of breath.  She denies any nausea, or vomiting.  Notes that she went to the  [facility] was prescribed Hydroxyzine, but chest pain continued thus she decided come back to the ER.  She denies any SI, HI, just states she is very anxious.    Home Medications Prior to Admission medications   Medication Sig Start Date End Date Taking? Authorizing Provider  rosuvastatin (CRESTOR) 10 MG tablet Take 1 tablet (10 mg total) by mouth daily. 09/03/22   Margaretann Loveless, MD  ALPRAZolam Prudy Feeler) 0.5 MG tablet Take 1 tablet (0.5 mg total) by mouth 2 (two) times daily as needed for sleep or anxiety. 07/27/22 07/27/23  Orson Eva, NP  doxycycline (VIBRA-TABS) 100 MG tablet Take 1 tablet (100 mg total) by mouth 2 (two) times daily. 09/09/22   Margaretann Loveless, PA-C  pantoprazole (PROTONIX) 40 MG tablet Take 1 tablet (40 mg total) by mouth daily. 09/02/22 09/02/23  Margaretann Loveless, MD  PARoxetine (PAXIL) 20 MG tablet Take 1 tablet (20 mg total) by mouth daily. 05/18/22   Bobbye Morton, MD      Allergies    Sulfa antibiotics    Review of Systems   Review of Systems  Respiratory:  Positive for shortness of breath.   Cardiovascular:  Positive for chest pain.  Gastrointestinal:  Negative for vomiting.    Physical Exam Updated Vital Signs BP 133/88   Pulse 89   Temp 98 F (36.7 C)   Resp 18   Ht 5\' 7"  (1.702 m)   Wt 76.2 kg   LMP 09/12/2022 (Approximate)   SpO2 98%   BMI 26.31 kg/m  Physical Exam Vitals and nursing note reviewed.  Constitutional:      General: She is not in acute distress.     Appearance: She is well-developed.  HENT:     Head: Normocephalic and atraumatic.  Eyes:     Conjunctiva/sclera: Conjunctivae normal.  Cardiovascular:     Rate and Rhythm: Regular rhythm. Tachycardia present.     Heart sounds: No murmur heard. Pulmonary:     Effort: Pulmonary effort is normal. No respiratory distress.     Breath sounds: Normal breath sounds.  Abdominal:     Palpations: Abdomen is soft.     Tenderness: There is no abdominal tenderness.  Musculoskeletal:        General: No swelling.     Cervical back: Neck supple.  Skin:    General: Skin is warm and dry.     Capillary Refill: Capillary refill takes less than 2 seconds.  Neurological:     Mental Status: She is alert.  Psychiatric:        Mood and Affect: Mood normal.     ED Results / Procedures / Treatments   Labs (all labs ordered are listed, but only abnormal results are displayed) Labs Reviewed  BASIC METABOLIC PANEL - Abnormal; Notable for the following components:      Result Value   Potassium 3.4 (*)    CO2 21 (*)    Glucose,  Bld 112 (*)    Calcium 10.4 (*)    All other components within normal limits  D-DIMER, QUANTITATIVE - Abnormal; Notable for the following components:   D-Dimer, Quant 0.78 (*)    All other components within normal limits  CBC  PREGNANCY, URINE  HCG, SERUM, QUALITATIVE  TROPONIN I (HIGH SENSITIVITY)  TROPONIN I (HIGH SENSITIVITY)    EKG None  Radiology CT Angio Chest PE W and/or Wo Contrast  Result Date: 10/04/2022 CLINICAL DATA:  Positive D-dimer and anxiety with central chest pain EXAM: CT ANGIOGRAPHY CHEST WITH CONTRAST TECHNIQUE: Multidetector CT imaging of the chest was performed using the standard protocol during bolus administration of intravenous contrast. Multiplanar CT image reconstructions and MIPs were obtained to evaluate the vascular anatomy. RADIATION DOSE REDUCTION: This exam was performed according to the departmental dose-optimization program which  includes automated exposure control, adjustment of the mA and/or kV according to patient size and/or use of iterative reconstruction technique. CONTRAST:  75mL OMNIPAQUE IOHEXOL 350 MG/ML SOLN COMPARISON:  Chest x-ray from earlier in the same day. CT from 07/31/2021 FINDINGS: Cardiovascular: Thoracic aorta shows a normal branching pattern. No aneurysmal dilatation is noted. No dissection is seen. No cardiac enlargement is noted. The pulmonary artery shows a normal branching pattern bilaterally. No filling defect to suggest pulmonary embolism is noted. Mediastinum/Nodes: Thoracic inlet is within normal limits. No hilar or mediastinal adenopathy is noted. The esophagus as visualized is within normal limits. Lungs/Pleura: Lungs are well aerated bilaterally. No focal infiltrate or sizable effusion is seen. Upper Abdomen: Scattered calculi are noted in the left kidney similar to that seen on prior CT in 2023. Musculoskeletal: No chest wall abnormality. No acute or significant osseous findings. Review of the MIP images confirms the above findings. IMPRESSION: No evidence of pulmonary emboli. Left renal calculi without obstructive change stable from the prior exam. Electronically Signed   By: Alcide Clever M.D.   On: 10/04/2022 21:30   DG Chest 2 View  Result Date: 10/04/2022 CLINICAL DATA:  Chest pain.  Anxiety EXAM: CHEST - 2 VIEW COMPARISON:  X-ray 05/02/2022 FINDINGS: The heart size and mediastinal contours are within normal limits. Both lungs are clear. No consolidation, pneumothorax, effusion or edema. The visualized skeletal structures are unremarkable. IMPRESSION: No acute cardiopulmonary disease. Electronically Signed   By: Karen Kays M.D.   On: 10/04/2022 15:36    Procedures Procedures    Medications Ordered in ED Medications  LORazepam (ATIVAN) tablet 1 mg (1 mg Oral Given 10/04/22 1854)  iohexol (OMNIPAQUE) 350 MG/ML injection 75 mL (75 mLs Intravenous Contrast Given 10/04/22 2119)    ED Course/  Medical Decision Making/ A&P                             Medical Decision Making Patient is a 37 year old female, here for chest pain, shortness of breath, while having bad anxiety.  We will obtain a D-dimer as she is tachycardic, short of breath denies chest pain, is PERC positive, as well as troponins for further evaluation.  Ativan for anxiety  Amount and/or Complexity of Data Reviewed Labs: ordered.    Details: Elevated D-dimer 0.78, troponin within normal limits Radiology: ordered.    Details: Chest x-ray clear, CTA shows no evidence of PE ECG/medicine tests:     Details: Sinus tachycardia Discussion of management or test interpretation with external provider(s): Patient with elevated D-dimer, CTA negative, chest pain shortness of breath likely secondary to anxiety  attack.  Troponins within normal limits.  We discussed follow-up with psychiatrist versus primary care doctor, for medication management.  Discussed return precautions patient voiced understanding  Risk Prescription drug management.    Final Clinical Impression(s) / ED Diagnoses Final diagnoses:  Chest pain, unspecified type    Rx / DC Orders ED Discharge Orders     None         Pete Pelt, PA 10/04/22 2209    Glendora Score, MD 10/05/22 1521

## 2022-10-04 NOTE — Progress Notes (Signed)
   10/04/22 1500  Spiritual Encounters  Type of Visit Initial  Care provided to: Patient  Conversation partners present during encounter Nurse  Referral source Patient request  Reason for visit Urgent spiritual support  OnCall Visit No  Spiritual Framework  Presenting Themes Meaning/purpose/sources of inspiration  Values/beliefs life  Community/Connection Family  Patient Stress Factors Loss;Loss of control  Interventions  Spiritual Care Interventions Made Compassionate presence;Established relationship of care and support;Reflective listening;Normalization of emotions;Meaning making;Meditation   Ch responded to request for emotional support. There was no family at bedside. Pt is afraid that she is going to die. She just put her cat down and she is afraid that she will die like her cat. She has a new job and she is afraid that her anxiety will make her lose her job. Also, she thinks that her lack of sleep is affecting her anxiety. Ch facilitated breathing exercise to lower anxiety and provided a non-anxious presence. No follow-up needed at this time.

## 2022-10-04 NOTE — ED Notes (Signed)
Patient A&O X 4, ambulatory. Pt discharged in no acute distress. Pt denies SI/HI/AVS upon discharge. Patient verbalized understanding of discharge. Pt escorted to lobby via staff for transport to [facility] ED via [employer]. Safety maintained

## 2022-10-04 NOTE — Discharge Instructions (Addendum)

## 2022-10-04 NOTE — ED Provider Notes (Cosign Needed Addendum)
Behavioral Health Urgent Care Medical Screening Exam  Patient Name: Samantha Nguyen MRN: [id number] Date of Evaluation: 10/04/22 Chief Complaint:  Anxiety  Diagnosis:  Final diagnoses:  Generalized anxiety disorder    History of Present illness: Samantha Nguyen is a 37 y.o. female. Pt presents to [facility] voluntarily escorted by EMS ([facility]) due to increased anxiety and fear of dying. Per EMS the pt reported becoming anxious today to the point of throwing up, and felt like she was going to die so she called EMS for assistance. Pt lives with her husband and 3 animals. Pt reports losing her cat a few days ago and she suspects that this may have been a trigger.  Patient currently lives at home by herself, denied using any drugs or alcohol.  Patient currently denies SI/HI/AVH states she is feeling just very anxious. Patient is appropriate and cooperative during assessment. Patient states she normally takes a Xanax for her anxiety but states she did not take 1 today because she does not want to be dependent.  Patient offered hydroxyzine for her anxiety and will be monitored by nursing staff to see how it makes her feel. Patient stated she does not feel she needs to stay overnight to be monitored.   Plan: Patient has been cleared by psychiatry. patient received 50 mg of hydroxyzine on arrival.  Patient was offered 1 mg of Ativan to help with reported symptoms.  Patient then requested to be evaluated at the local emergency department due to worsening anxiety symptoms.  Provider contacted [facility] for acceptance.  Patient to be transported via safe transport.   During evaluation [name] is sitting in the observation room, in no acute distress. She is alert/oriented x 4; calm/cooperative; and mood congruent with affect. She is speaking in a clear tone at moderate volume, and normal pace; with good eye contact, patient is tearful throughout assessment.  Her thought process is coherent and relevant; There is no  indication that she is currently responding to internal/external stimuli or experiencing delusional thought content; and she has denied suicidal/self-harm/homicidal ideation, psychosis, and paranoia.   Patient has remained calm throughout assessment and has answered questions appropriately.   Patient states she was recently a part of the Partial hospitalization program and had to leave and wanted to return but was unable too. PHP referral sent.   At this time [name] is educated and verbalizes understanding of mental health resources and other crisis services in the community. She is instructed to call 911 and present to the nearest emergency room should she experience any suicidal/homicidal ideation, auditory/visual/hallucinations, or detrimental worsening of her mental health condition. She was a also advised by Clinical research associate that she could call the toll-free phone on insurance card to assist with identifying in network counselors and agencies or number on back of Medicaid card t speak with care coordinator   Flowsheet Row ED from 10/04/2022 in [facility] ED from 09/30/2022 in [facility] at [facility]  ED from 09/09/2022 in [facility] at [facility]   C-SSRS RISK CATEGORY No Risk No Risk No Risk       Psychiatric Specialty Exam  Presentation  General Appearance:Appropriate for Environment  Eye Contact:Good  Speech:Clear and Coherent  Speech Volume:Decreased  Handedness:Right   Mood and Affect  Mood: Depressed  Affect: Depressed; Flat; Tearful   Thought Process  Thought Processes: Coherent  Descriptions of Associations:Intact  Orientation:Full (Time, Place and Person)  Thought Content:WDL  Diagnosis of Schizophrenia or  Schizoaffective disorder in past: No   Hallucinations:None  Ideas of Reference:None  Suicidal Thoughts:No  Homicidal Thoughts:No   Sensorium  Memory: Immediate Fair; Recent  Fair  Judgment: Fair  Insight: Fair   Chartered certified accountant: Fair  Attention Span: Fair  Recall: [employer] of Knowledge: Fair  Language: Fair   Psychomotor Activity  Psychomotor Activity: Normal   Assets  Assets: Manufacturing systems engineer; Social Support; Housing   Sleep  Sleep: Fair  Number of hours:  0 (7 to 12 hours/night)   Physical Exam: Physical Exam Vitals and nursing note reviewed. Exam conducted with a chaperone present.  Constitutional:      Appearance: Normal appearance.  Neurological:     Mental Status: She is alert and oriented to person, place, and time.  Psychiatric:        Attention and Perception: Attention normal.        Mood and Affect: Mood is anxious. Affect is tearful.        Speech: Speech normal.        Behavior: Behavior is cooperative.        Thought Content: Thought content normal.        Cognition and Memory: Memory normal.        Judgment: Judgment normal.    Review of Systems  Constitutional: Negative.   Psychiatric/Behavioral:  The patient is nervous/anxious.        Anxiety  All other systems reviewed and are negative.  Blood pressure (!) 125/97, pulse (!) 124, temperature 98.7 F (37.1 C), temperature source Oral, resp. rate 18, last menstrual period 09/12/2022, SpO2 100%. There is no height or weight on file to calculate BMI.  Musculoskeletal: Strength & Muscle Tone: within normal limits Gait & Station: normal Patient leans: N/A   [facility] MSE Discharge Disposition for Follow up and Recommendations: Based on my evaluation the patient does not appear to have an emergency medical condition and can be discharged with resources and follow up care in outpatient services for Medication Management.    [name], NP 10/04/2022, 1:11 PM

## 2022-10-04 NOTE — ED Triage Notes (Signed)
Pt states she's been having a lot of anxiety and is afraid she is going to die; denies si/hi, states "I don't want to die"; pt seen at [facility] earlier today, given hydroxyzine; pt tearful in triage; endorses central CP that started today at [facility]

## 2022-10-04 NOTE — Progress Notes (Signed)
   10/04/22 1203  [facility] Triage Screening (Walk-ins at [facility] only)  How Did You Hear About [name]? Other (Comment) (EMS)  What Is the Reason for Your Visit/Call Today? Pt presents to [facility] voluntarily escorted by EMS due to increased anxiety and fear of dying. Per EMS the pt reported becoming anxious today to the point of throwing up, and felt like she was going to die so she called EMS for assistance. Pt lives with her husband and 3 animals. Pt reports losing her cat a few days ago and she suspects that this may have been a trigger. Pt denies SI/HI and AVH.  How Long Has This Been Causing You Problems? 1 wk - 1 month  Have You Recently Had Any Thoughts About Hurting Yourself? No  Are You Planning to Commit Suicide/Harm Yourself At This time? No  Have you Recently Had Thoughts About Hurting Someone Karolee Ohs? No  Are You Planning To Harm Someone At This Time? No  Are you currently experiencing any auditory, visual or other hallucinations? No  Have You Used Any Alcohol or Drugs in the Past 24 Hours? No  Do you have any current medical co-morbidities that require immediate attention? No  Clinician description of patient physical appearance/behavior: anxious, tearful  What Do You Feel Would Help You the Most Today? Treatment for Depression or other mood problem  If access to [facility] Urgent Care was not available, would you have sought care in the Emergency Department? No  Determination of Need Routine (7 days)  Options For Referral Outpatient Therapy;Medication Management

## 2022-10-05 ENCOUNTER — Other Ambulatory Visit: Payer: Self-pay

## 2022-10-05 ENCOUNTER — Other Ambulatory Visit: Payer: Self-pay | Admitting: Gastroenterology

## 2022-10-05 ENCOUNTER — Encounter (HOSPITAL_COMMUNITY): Payer: Self-pay

## 2022-10-05 ENCOUNTER — Telehealth (HOSPITAL_COMMUNITY): Payer: Self-pay | Admitting: Professional

## 2022-10-05 ENCOUNTER — Ambulatory Visit (HOSPITAL_COMMUNITY): Payer: MEDICAID

## 2022-10-05 LAB — BHCG, QUANT PREGNANCY: BHCG, QUANT PREGNANCY: <4 m[IU]/mL

## 2022-10-05 LAB — CBC AND DIFFERENTIAL
Baso # K/uL: 0.1 10*3/uL (ref 0.0–0.2)
Basophil %: 0 % (ref 0–3)
Eos # K/uL: 0.1 10*3/uL (ref 0.0–0.6)
Eosinophil %: 1 % (ref 0–5)
Hematocrit: 24 % — ABNORMAL LOW (ref 35–47)
Hemoglobin: 6.2 g/dL — ABNORMAL LOW (ref 12.0–16.0)
Lymph # K/uL: 2.2 10*3/uL (ref 1.0–4.8)
Lymphocyte %: 17 % (ref 15–45)
MCH: 19.1 pg — ABNORMAL LOW (ref 26.0–34.0)
MCHC: 26.2 g/dL — ABNORMAL LOW (ref 31.0–37.5)
MCV: 73 fL — ABNORMAL LOW (ref 80–100)
Mono # K/uL: 0.9 10*3/uL (ref 0.1–1.0)
Monocyte %: 7 % (ref 0–15)
Neut # K/uL: 9.3 10*3/uL — ABNORMAL HIGH (ref 1.8–8.0)
Platelets: 367 10*3/uL (ref 150–450)
RBC: 3.25 10*6/uL — ABNORMAL LOW (ref 3.80–5.20)
RDW: 19.4 % — ABNORMAL HIGH (ref 0.0–15.2)
Seg Neut %: 74 % (ref 45–75)
WBC: 12.7 10*3/uL — ABNORMAL HIGH (ref 4.0–11.0)

## 2022-10-05 LAB — URINALYSIS REFLEX TO CULTURE
Blood,UA: NEGATIVE
Glucose, Ur: NEGATIVE mg/dL
Ketones, UA: NEGATIVE mg/dL
Leuk Esterase,UA: NEGATIVE
Nitrite,UA: NEGATIVE
PH,Ur: 6 (ref 5.0–8.0)
Specific Gravity,UA: 1.018 (ref 1.005–1.030)

## 2022-10-05 LAB — UNMAPPED LAB RESULTS
ABO RH Blood Type (HT): A POS
Antibody Screen (HT): NEGATIVE
Basophil # (HT): 0.1 10 3/uL (ref 0.0–0.2)
Basophil % (HT): 0 % (ref 0–3)
Eosinophil # (HT): 0.1 10 3/uL (ref 0.0–0.6)
Eosinophil % (HT): 1 % (ref 0–5)
Hematocrit (HT): 24 % — ABNORMAL LOW (ref 35–47)
Hemoglobin (HGB) (HT): 6.2 g/dL — ABNORMAL LOW (ref 12.0–16.0)
Lymphocyte # (HT): 2.2 10 3/uL (ref 1.0–4.8)
Lymphocyte % (HT): 17 % (ref 15–45)
MCHC (HT): 26.2 g/dL — ABNORMAL LOW (ref 31.0–37.5)
MCV (HT): 73 fL — ABNORMAL LOW (ref 80–100)
Mean Corpuscular Hemoglobin (MCH) (HT): 19.1 pg — ABNORMAL LOW (ref 26.0–34.0)
Monocyte # (HT): 0.9 10 3/uL (ref 0.1–1.0)
Monocyte % (HT): 7 % (ref 0–15)
Neutrophil # (HT): 9.3 10 3/uL — ABNORMAL HIGH (ref 1.8–8.0)
Platelets (HT): 367 10 3/uL (ref 150–450)
RBC (HT): 3.25 10 6/uL — ABNORMAL LOW (ref 3.80–5.20)
RDW (HT): 19.4 % — ABNORMAL HIGH (ref 0.0–15.2)
Seg Neut % (HT): 74 % (ref 45–75)
WBC (HT): 12.7 10 3/uL — ABNORMAL HIGH (ref 4.0–11.0)

## 2022-10-05 LAB — TYPE AND SCREEN
ABO RH Blood Type: A POS
Antibody Screen: NEGATIVE

## 2022-10-05 LAB — BASIC METABOLIC PANEL
Anion Gap: 3 mmol/L — ABNORMAL LOW (ref 4–16)
CO2: 30 meq/L (ref 22–30)
Calcium: 9.4 mg/dL (ref 8.5–10.2)
Chloride: 107 mmol/L (ref 98–108)
Creatinine: 1 mg/dL — ABNORMAL HIGH (ref 0.5–0.9)
Glucose: 124 mg/dL — ABNORMAL HIGH (ref 65–100)
Lab: 18 mg/dL (ref 8–20)
Potassium: 4.4 mmol/L (ref 3.5–5.1)
Sodium: 140 mmol/L (ref 135–145)

## 2022-10-05 LAB — RBC MORPHOLOGY

## 2022-10-05 LAB — EGFR CKD-EPI REFIT: eCFR CKD-EPI REFIT: >60 mL/min

## 2022-10-05 NOTE — H&P (Signed)
-------------------------------------------------------------------------------  Attestation signed by Silvio Clayman, MD at 10/06/2022  7:40 AM  See my note from 7/24  -------------------------------------------------------------------------------    HOSPITAL ADMISSION NOTE- [facility]    Jenna [name] is a 37 y.o. female, seen by Bing Quarry ten Broeke, ACNP-C on 10/05/2022 at 10:39 PM    Problem List Items Addressed This Visit          Other    Anemia, unspecified - Primary     Other Visit Diagnoses       Closed fracture of second thoracic vertebra, unspecified fracture morphology, initial encounter        Closed fracture of third thoracic vertebra, unspecified fracture morphology, initial encounter                History     Chief Complaint   Patient presents with    Fall     HPI  37 year old female, PMHx tobacco use, hypertension, morbid obesity, menorrhagia, iron deficiency anemia, asthma, anxiety/depression, hidradenitis, GERD, medication noncompliance presents ED for evaluation of chief complaint fall with injury.  Reportedly stepped on a toy while walking down her staircase, subsequently fell forward and tumbled down a total of 16 steps.  Did hit her head but denies loss of consciousness not on anticoagulation.  Was able to ambulate afterwards with the assistance of her spouse and laid on the couch.  She complains of pain to her right shoulder, neck and upper back.  Denies any numbness or paresthesias.  Denies bowel/bladder incontinence, saddle anesthesia or overt motor weakness.  Continues ambulatory.  Denies chest pain, palpitations, lower extremity swelling  Denies shortness of breath, URI symptomatology, cough or fevers  Denies abdominal pain, nausea or vomiting, diarrhea.  Symptoms, lightheadedness or syncopal prodrome.  Current tobacco user, denies other illicit drug use, denies EtOH use.    Past Medical History:   Diagnosis Date    Anemia     Anemia, unspecified 01/06/2022    Anxiety     Asthma      Cocaine abuse     Depression     History of blood transfusion 01/06/2022    For anemia 2/2 AUB on 01/07/21    Hydradenitis     Substance induced mood disorder        Past Surgical History:   Procedure Laterality Date    CESAREAN SECTION      CONVERTED FROM CERNER PROCEDURE HISTORY      H/O: C-section    CONVERTED FROM CERNER PROCEDURE HISTORY      H/O: C-section    CONVERTED FROM CERNER PROCEDURE HISTORY      H/O: C-section    FRACTURE SURGERY         No family history on file.    Social History     Social History     Socioeconomic History    Marital status: Single     Spouse name: Not on file    Number of children: Not on file    Years of education: Not on file    Highest education level: Not on file   Occupational History    Not on file   Tobacco Use    Smoking status: Every Day     Current packs/day: 0.50     Types: Cigarettes    Smokeless tobacco: Never   Substance and Sexual Activity    Alcohol use: No     Comment: Pt is a recovering addict    Drug use: Not Currently  Types: "Crack" cocaine, Cocaine, Marijuana     Comment: marijuana ~ 1x/month     Sexual activity: Yes     Birth control/protection: None   Other Topics Concern    Not on file   Social History Narrative    ** Merged History Encounter **         Tobacco ZOX:WRUEAVW every day smoker;    Tobacco type:Cigarettes;    Alcohol UJW:[id number];    Alcohol frequency:1-2 times per year;    Substance abuse GNF:AOZHYQM;    Substance abuse type:Cocaine;    Tobacco:;started 2007, smokes 4 cigs/day - 06/18/2014 11:45:24 EDT - Gaylyn Cheers    Substance Abuse:;last used 06/15/2014 - 06/18/2014 11:46:09 EDT - Gaylyn Cheers     Social Determinants of Health     Financial Resource Strain: Not on file   Food Insecurity: Not on file   Transportation Needs: Not on file   Physical Activity: Not on file   Stress: Not on file   Social Connections: Not on file   Intimate Partner Violence: Unknown (10/02/2022)    Received from [facility]    Intimate  Partner Violence     Fear of Current or Ex-Partner: Not on file   Housing Stability: Not on file       Review of Systems     Review of Systems   Constitutional:  Negative for chills and fever.   HENT:  Negative for congestion.    Eyes:  Negative for visual disturbance.   Respiratory:  Negative for shortness of breath and wheezing.    Cardiovascular:  Negative for chest pain and palpitations.   Gastrointestinal:  Negative for abdominal pain, diarrhea, nausea and vomiting.   Endocrine: Negative for cold intolerance and heat intolerance.   Genitourinary:  Negative for dysuria, flank pain and hematuria.   Musculoskeletal:  Positive for back pain, myalgias and neck pain.   Skin:  Negative for rash.   Neurological:  Negative for syncope and light-headedness.   Psychiatric/Behavioral: Negative.         Physical Exam     Vitals:    10/05/22 1531 10/05/22 1858 10/05/22 2015 10/05/22 2032   BP: (!) 158/90 (!) 198/93 (!) 165/88 (!) 180/90   Pulse: 90 96 90 88   Resp: 20 (!) 36 20 18   Temp: 37 C (98.6 F) 36.9 C (98.4 F) 36.4 C (97.5 F) 36.5 C (97.7 F)   TempSrc: Oral Oral Oral Oral   SpO2: 100% 98% 98% 97%   Weight:       Height:           Physical Exam  Constitutional:       General: She is not in acute distress.     Appearance: Normal appearance. She is obese. She is not toxic-appearing or diaphoretic.      Comments: Nontoxic   HENT:      Head: Normocephalic and atraumatic.      Right Ear: External ear normal.      Left Ear: External ear normal.      Nose: Nose normal. No congestion.      Mouth/Throat:      Mouth: Mucous membranes are moist.      Pharynx: Oropharynx is clear.   Eyes:      Extraocular Movements: Extraocular movements intact.      Conjunctiva/sclera: Conjunctivae normal.   Cardiovascular:      Rate and Rhythm: Normal rate and regular rhythm.      Pulses:  Normal pulses.      Heart sounds: Normal heart sounds. No murmur heard.     No gallop.   Pulmonary:      Effort: Pulmonary effort is normal. No  respiratory distress.      Breath sounds: Normal breath sounds. No wheezing or rales.   Abdominal:      General: Bowel sounds are normal. There is no distension.      Palpations: Abdomen is soft.      Tenderness: There is no abdominal tenderness. There is no right CVA tenderness, left CVA tenderness, guarding or rebound.   Musculoskeletal:         General: Tenderness present. No deformity. Normal range of motion.      Cervical back: Normal range of motion and neck supple. Tenderness present.      Right lower leg: No edema.      Left lower leg: No edema.      Comments: + TTP to lower C-spine >> mid thoracic spine. No swelling or step offs. No Lumbar tenderness   Skin:     General: Skin is warm and dry.      Findings: No rash.   Neurological:      General: No focal deficit present.      Mental Status: She is alert and oriented to person, place, and time. Mental status is at baseline.   Psychiatric:         Mood and Affect: Mood normal.         Behavior: Behavior normal.         Thought Content: Thought content normal.         Judgment: Judgment normal.         Tests     Labs:    Results for orders placed or performed during the hospital encounter of 10/05/22   CBC and differential   Result Value Ref Range    WBC 12.7 (H) 4.0 - 11.0 10 3/uL    RBC 3.25 (L) 3.80 - 5.20 10 6/uL    HGB 6.2 (L) 12.0 - 16.0 g/dL    HCT 24 (L) 35 - 47 %    MCV 73 (L) 80 - 100 fL    MCH 19.1 (L) 26.0 - 34.0 pg    MCHC 26.2 (L) 31.0 - 37.5 g/dL    RDW 16.1 (H) 0.0 - 09.6 %    PLATELET COUNT 367 150 - 450 10 3/uL    NEUTROPHILS 74 45 - 75 %    LYMPHOCYTES 17 15 - 45 %    MONOCYTES 7 0 - 15 %    EOSINOPHILS 1 0 - 5 %    BASOPHILS 0 0 - 3 %    NEUTROPHIL # 9.3 (H) 1.8 - 8.0 10 3/uL    LYMPHOCYTE # 2.2 1.0 - 4.8 10 3/uL    MONOCYTE # 0.9 0.1 - 1.0 10 3/uL    EOSINOPHIL # 0.1 0.0 - 0.6 10 3/uL    BASOPHIL # 0.1 0.0 - 0.2 10 3/uL   HCG, Quantitative   Result Value Ref Range    HCG BETA <4 mIU/mL   Hold Blue   Result Value Ref Range    HOLD BLUE Tube  stored    Differential   Result Value Ref Range    DIFFERENTIAL AUTOMATED    Urinalysis W Reflex To Micro And Cul   Result Value Ref Range    COLOR, UR YELLOW [YELLOW]    APPEARANCE, UR CLEAR [  CLEAR]    GLUCOSE, UR NEG [NEGATIVE] mg/dL    KETONES, UR NEG [NEGATIVE] mg/dL    SPEC GRAVITY 1.610 9.604 - 1.030    BLOOD, UR NEG [NEGATIVE]    PH, UR 6.0 5.0 - 8.0    PROTEIN, UR NEG [NEGATIVE] mg/dL    NITRITES, UR NEG [NEGATIVE]    LEUK ESTERASE, UR NEG [NEGATIVE]   RBC Morphology   Result Value Ref Range    MORPHOLOGY See Below (A)     ANISOCYTOSIS 1+     HYPOCHROMIA 3+ (A)     MICROCYTIC 1+     OVALOCYTES 1+     TEARDROP CELLS 1+ (A)    Basic Metabolic Panel   Result Value Ref Range    SODIUM 140 135 - 145 mmol/L    POTASSIUM 4.4 3.5 - 5.1 mmol/L    CHLORIDE 107 98 - 108 mmol/L    CO2 30 22 - 30 mEq/L    ANION GAP 3 (L) 4 - 16 mmol/L    BUN 18 8 - 20 mg/dL    CREATININE 1.0 (H) 0.5 - 0.9 mg/dL    GLUCOSE 540 (H) 65 - 100 mg/dL    CALCIUM 9.4 8.5 - 98.1 mg/dL   eGFR CKD-EPI REFIT   Result Value Ref Range    eGFR CKD-EPI REFIT >60 mL/min   Type and Screen    Result Value Ref Range    ABO RH A Rh Positive     ANTIBODY SCREEN Negative    Blood Bank Order to Prepare PRBC's, 1 Units   Result Value Ref Range    PACKED RBC UNITS 1     DATE REQUIRED, PRBC 10/05/22    Red Cells   Result Value Ref Range    UNIT NUMBER [id number]     PRODUCT CODE [id number]     DISPENSE STATUS Issued     BLOOD TYPE 6200     PRODUCT RBC LR     CODING SYSTEM [id number]    Blood Bank Order to Prepare PRBC's, 1 Units   Result Value Ref Range    PACKED RBC UNITS 1        Imaging: EKG  Noel Christmas, MD     10/05/2022 10:36 PM  --------------------  EKG    Date/Time: 10/05/2022 3:35 PM    Performed by: Noel Christmas, MD  Authorized by: Noel Christmas, MD  EKG reviewed: EKG reviewed and   personally interpreted by me  Cardiac rate (bpm): 90 bpm  EKG rhythm: sinus rhythm  Axis: normal  PR interval: normal PR interval  QRS interval:  normal QRS interval  QT interval: normal QT interval  ST segment: ST segments normal  T wave:non-specific ST-T wave changes  T wave inversion on lead: III  Clinical impression: Sinus rhythm at 90 bpm  --------------------  CT Abdomen Pelvis Trauma  Indication: 37 years old Female. Abdominal Pain.     Comparison: 11/22/2018.    Technique: Axial CT images of the abdomen and pelvis after the uneventful IV administration of 100 mL of Omnipaque 350. Unenhanced images were not obtained. Oral contrast was not administered. Coronal and sagittal reformats were also obtained.    --Findings--    Examination sensitivity and specificity is limited by patient habitus and soft tissue attenuation.     -Abdomen-    Lung Bases: Please see separate chest report.  Heart: Please see separate chest report.  Stomach: Normal.  Bowel/Mesentery: Normal.  Liver: Normal.  Portal  System: Patent.  Gallbladder: Surgically absent. Cholecystectomy clips.  Bile Ducts: Normal.  Spleen: Normal.  Pancreas: Normal.  Adrenal Glands: Normal.  Kidneys: Normal.     -Pelvis-    Appendix: Normal.  Bladder: Normal.  Reproductive Organs: Normal uterus and adnexa status post bilateral tubal ligation.  Free Fluid: None.  Groin: Normal.    -Misc-    Lymph Nodes: No lymphadenopathy.  Vasculature: Patent. Nonaneurysmal.  Soft Tissues: Tiny fat containing umbilical hernia.  Bones: No acute osseous abnormality.    --Impression--  1.  No evidence of trauma or acute disease is seen in the abdomen and pelvis, markedly limited by patient habitus and soft tissue attenuation.    Approved by: Meryle Ready, MD on 10/05/2022 10:19 PM    This is a preliminary report and may contain typographical or content errors. This report has NOT been corrected or finalized. Please use your discretion when acting upon the content of this report.  CT chest with IV contrast  Indication: 37 years old Female. Chest trauma, blunt. Chest pain, unspecified.     Comparison: Chest radiographs  12/11/2016.    Technique: Chest CT was performed using 1.25 mm collimation from the lung apices through the lung bases after the uneventful administration of 100 mL of Omnipaque 350. Coronal and sagittal reformats were obtained.    --Findings--    Examination sensitivity and specificity is limited by patient habitus and soft tissue attenuation, as well as respiratory motion.    Lungs/Pleura: Mild bibasilar atelectasis. No pleural effusion. No pneumothorax.    Airway: Central airways appear patent.    Heart: Normal size. No pericardial effusion.    Vasculature: Dilated main pulmonary artery of 3.7 cm. Nonspecific, however can be congenital, idiopathic, or seen in the context of pulmonary hypertension. Thoracic aorta is normal in course and caliber.    Lymph Nodes: No enlarged lymph nodes by CT size criteria in the mediastinum, hilar, or axillary regions.    Thyroid: Unremarkable.    Esophagus: Unremarkable.    Abdomen: Please see separately dictated report for findings below the diaphragm.    Soft Tissues: Unremarkable.    Bones: Previously identified T3 and possible T2 fractures are poorly visualized. No other acute fractures are seen.    --Impression--  1.  No evidence of trauma or acute disease is seen in the chest, markedly limited by patient habitus and soft tissue attenuation, as well as respiratory motion.  2.  Previously identified T3 and possible T2 fractures are poorly visualized.    Approved by: Meryle Ready, MD on 10/05/2022 10:14 PM    This is a preliminary report and may contain typographical or content errors. This report has NOT been corrected or finalized. Please use your discretion when acting upon the content of this report.  XR T spine AP and lateral  Indication: 37 years old Female. Pain in joint, shoulder region.     Comparison: None.    Technique: AP, lateral, and swimmer?s radiographs of the thoracic spine are obtained on 5 total images.    --Findings--    There is normal alignment of the  visualized thoracic spine. Vertebral body heights and disc spaces are maintained. No fracture is seen. Right upper quadrant surgical clips. Mild degenerative changes. Question pulmonary vascular congestion.    --Impression--  1.  No acute osseous abnormality.    Approved by: Meryle Ready, MD on 10/05/2022 7:25 PM    The undersigned attending radiologist has personally reviewed the examination and the resident's interpretation thereof,  and agrees with the findings.  Haskel Khan    Signed by Attending: Haskel Khan, MD on 10/05/2022 7:47 PM  CT cervical spine without IV contrast  Indication: 37 years old Female. Neck trauma, midline tenderness (Age 68-64y).     Comparison: None.    Technique: Helical cervical spine CT scan was performed with 1.25 and 2.5 mm axial collimation from the base of the skull to the top of T-1 without IV contrast. Coronal and sagittal reformats were performed utilizing reconstructed 2 mm thick images.    --Findings--    Examination sensitivity and specificity is limited by patient habitus and soft tissue attenuation.    Spine: Mild reversal of cervical lordosis, possibly degenerative or positional. Possible nondisplaced fracture of the T3 right inferior articular process, extending into the transverse process and questionably into the pedicle. Images 602-67, 601-92,  3-145 (saved images). Questionably T2 as well at the same site (image [id number]), although only appreciated on axial view. The dens is intact, the lateral masses of C1 are normally aligned, and the atlantodental interval is normal. Vertebral body heights are  normal. Disc space heights are normal.    Spinal Canal: No osseous narrowing of the spinal canal.    Soft Tissues: The prevertebral space appears slightly thickened, although without fluid signal. May be secondary to generalized adiposity. Visualized neck soft tissues are normal.    Other: Visualized lung apices are clear. Normal attenuation of the thyroid.      --Impression--  1.  Study limited by patient habitus and soft tissue attenuation.  2.  Possible nondisplaced fracture of T3 right inferior articular/transverse process, versus artifact from habitus/attenuation. Questionably T2 as well as the same site.  3.  Prevertebral space thickening may be secondary to generalized adiposity, no fluid signal is seen to suggest abnormal fluid collection in this space.    Critical findings were discussed with Margaretmary Lombard, MD via phone by Meryle Ready, MD at 5:37 PM on 10/05/2022.    Approved by: Meryle Ready, MD on 10/05/2022 5:38 PM    The undersigned attending radiologist has personally reviewed the examination and the resident's interpretation thereof, and agrees with the findings.  Haskel Khan    Signed by Attending: Haskel Khan, MD on 10/05/2022 5:42 PM  CT head without IV contrast  Indication:Head trauma, abnormal mental status (Age 60-64y). Injury Head, unspecified. .    Comparison:04/30/2008.    Technique: Head CT was performed without IV contrast. Sagittal and coronal reformats were performed.    Findings: There is no CT evidence for intracranial hemorrhage, mass effect or acute territorial infarction.     Mild ethmoidal mucosal thickening. Polypoid mucosal thickening of the right maxillary sinus.    The visualized orbits and globes are intact. Apparent proptosis bilaterally without intraorbital mass. The extraocular muscles appear to be within normal limits. There is no displaced or depressed skull fracture. There is no suspicious destructive  osseous lesion. Cavities.    Impression:  1.  No CT evidence for intracranial hemorrhage, mass effect or acute territorial infarction.      Signed by Attending: Haskel Khan, MD on 10/05/2022 5:14 PM  XR Shoulder RIGHT  Indication: 37 years old Female. Pain.     Comparison: None.    Technique: AP, oblique, and Y radiographs of the RIGHT shoulder are obtained.    --Findings--    Examination sensitivity and specificity is  limited by patient habitus and soft tissue overlap.  No fracture, dislocation, or destructive osseous lesion is seen. The  visualized hemithorax is within normal limits.    --Impression--  1.  No acute osseous abnormality.    Approved by: Meryle Ready, MD on 10/05/2022 5:00 PM    The undersigned attending radiologist has personally reviewed the examination and the resident's interpretation thereof, and agrees with the findings.  Haskel Khan    Signed by Attending: Haskel Khan, MD on 10/05/2022 5:04 PM      EKG: NSR nonischemic    ED Procedures     Procedures    Medical Decision Making     MDM    Assessment:  37 year old female, PMHx tobacco use, hypertension, morbid obesity, menorrhagia, iron deficiency anemia, asthma, anxiety/depression, hidradenitis, GERD, medication noncompliance placed in [facility] for evaluation of mechanical fall with probable T3 fracture. Seen by Orthopedics.     WBC 12.7, H/H 6.2/24 (microcytic, hypochromic, elevated RDW), UA not infected, electrolytes stable, creatinine/BUN 1.0/18, hCG beta negative.    CT head (10/05/2022): No CT evidence for intracranial hemorrhage, mass effect or acute territorial infarction     CT C-spine (10/05/2022): Possible nondisplaced fracture of T3 right inferior articular/transverse process, versus artifact from habitus/attenuation. Questionably T2 as well as the same site.    Afebrile. Hypertensive. Not hypoxic.    Plan:   Mechanical fall  Probable T3 fracture  NAD. Afebrile. Nontoxic. CT head without acuity. No intra-abdominal or intrathoracic process on trauma imaging. CT C-spine shows a possible nondisplaced fracture of T3 right inferior articular/transverse process which was poorly visualized on CT chest. No red flag sx.  Seen by Orthopedics-appreciate input and further recommendations:  "Dr. Terese Door to review images tomorrow morning and make final recommendations".   OOB with assistance for bathroom privileges.  Pain control Tylenol ATC, Lidocaine, Morphine.  PT  evaluation.    Anemia  Microcytic, hypochromic, elevated RDW previous iron deficient likely related to menorrhagia. No overt bleeding. Type and screen complete consent obtained 2 units PRBCs ordered by ED. Update iron panel. IV Venofer in-house.     Hypertension-noncompliant with home antihypertensives. Restart amlodipine, carvedilol.    Asthma-respiratory stable without wheeze. Budesonide. As needed albuterol.    Medically preferred DVT prophylaxis: TEDs    Bing Quarry ten Ned Card, ACNP-C    ED [employer]

## 2022-10-06 ENCOUNTER — Other Ambulatory Visit: Payer: Self-pay | Admitting: Gastroenterology

## 2022-10-06 DIAGNOSIS — S22029A Unspecified fracture of second thoracic vertebra, initial encounter for closed fracture: Secondary | ICD-10-CM | POA: Insufficient documentation

## 2022-10-06 LAB — UNMAPPED LAB RESULTS
Basophil # (HT): 0.1 10 3/uL (ref 0.0–0.2)
Basophil % (HT): 1 % (ref 0–3)
Eosinophil # (HT): 0.2 10 3/uL (ref 0.0–0.6)
Eosinophil % (HT): 1 % (ref 0–5)
Hematocrit (HT): 28 % — ABNORMAL LOW (ref 35–47)
Hemoglobin (HGB) (HT): 7.7 g/dL — ABNORMAL LOW (ref 12.0–16.0)
Lymphocyte # (HT): 2.5 10 3/uL (ref 1.0–4.8)
Lymphocyte % (HT): 20 % (ref 15–45)
MCHC (HT): 27.5 g/dL — ABNORMAL LOW (ref 31.0–37.5)
MCV (HT): 78 fL — ABNORMAL LOW (ref 80–100)
Mean Corpuscular Hemoglobin (MCH) (HT): 21.6 pg — ABNORMAL LOW (ref 26.0–34.0)
Monocyte # (HT): 0.9 10 3/uL (ref 0.1–1.0)
Monocyte % (HT): 7 % (ref 0–15)
Neutrophil # (HT): 9 10 3/uL — ABNORMAL HIGH (ref 1.8–8.0)
Platelets (HT): 350 10 3/uL (ref 150–450)
RBC (HT): 3.57 10 6/uL — ABNORMAL LOW (ref 3.80–5.20)
RDW (HT): 19.2 % — ABNORMAL HIGH (ref 0.0–15.2)
Seg Neut % (HT): 71 % (ref 45–75)
WBC (HT): 12.7 10 3/uL — ABNORMAL HIGH (ref 4.0–11.0)

## 2022-10-06 LAB — IRON
Iron: 15 ug/dL — ABNORMAL LOW (ref 50–170)
TIBC: 436 ug/dL (ref 250–450)
Transferrin Saturation: 3 % — ABNORMAL LOW (ref 15–55)

## 2022-10-06 LAB — FERRITIN: Ferritin: 2 ng/mL — ABNORMAL LOW (ref 30–291)

## 2022-10-06 NOTE — Progress Notes (Signed)
HOSPITAL PROGRESS NOTE- [facility]    Jenna [name] is a 37 y.o. female, seen by Silvio Clayman, MD on 10/06/2022 at 2:45 PM    Chief Complaint:   Chief Complaint   Patient presents with    Fall       Subjective: Saw the patient today.  Continues to have pain in shoulder and upper part of her back.  Denies weakness, numbness, tingling. Some spasms    Nursing Pain Score:   Pain Scores/Locations    10/05/22 1459 10/05/22 1531 10/05/22 1621 10/05/22 1924   PainSc:   7 10-Worst pain ever   8   7    10/05/22 1957 10/05/22 2100 10/05/22 2248 10/06/22 0031   PainSc:   8   5 0-No pain   7    10/06/22 0531 10/06/22 0847 10/06/22 0854 10/06/22 1314   PainSc:   8   8   8   8         Vitals: Temp:  [36.2 C (97.2 F)-37 C (98.6 F)] 36.6 C (97.9 F)  Pulse:  [62-104] 85  Resp:  [16-36] 20  BP: (137-198)/(75-96) 158/95    Physical Examination:   Physical Exam  Vitals and nursing note reviewed.   Cardiovascular:      Rate and Rhythm: Normal rate and regular rhythm.      Pulses: Normal pulses.      Heart sounds: Normal heart sounds.   Pulmonary:      Effort: Pulmonary effort is normal. No respiratory distress.      Breath sounds: Normal breath sounds.   Abdominal:      General: Bowel sounds are normal. There is no distension.      Palpations: Abdomen is soft.      Tenderness: There is no abdominal tenderness.   Musculoskeletal:         General: Tenderness (over lower cervical and upper thoracic spine) present.      Right lower leg: No edema.      Left lower leg: No edema.   Skin:     General: Skin is warm and dry.         Lab Results:   Results for orders placed or performed during the hospital encounter of 10/05/22   CBC and differential   Result Value Ref Range    WBC 12.7 (H) 4.0 - 11.0 10 3/uL    RBC 3.25 (L) 3.80 - 5.20 10 6/uL    HGB 6.2 (L) 12.0 - 16.0 g/dL    HCT 24 (L) 35 - 47 %    MCV 73 (L) 80 - 100 fL    MCH 19.1 (L) 26.0 - 34.0 pg    MCHC 26.2 (L) 31.0 - 37.5 g/dL    RDW 45.4 (H) 0.0 - 09.8 %    PLATELET COUNT 367 150 - 450  10 3/uL    NEUTROPHILS 74 45 - 75 %    LYMPHOCYTES 17 15 - 45 %    MONOCYTES 7 0 - 15 %    EOSINOPHILS 1 0 - 5 %    BASOPHILS 0 0 - 3 %    NEUTROPHIL # 9.3 (H) 1.8 - 8.0 10 3/uL    LYMPHOCYTE # 2.2 1.0 - 4.8 10 3/uL    MONOCYTE # 0.9 0.1 - 1.0 10 3/uL    EOSINOPHIL # 0.1 0.0 - 0.6 10 3/uL    BASOPHIL # 0.1 0.0 - 0.2 10 3/uL   HCG, Quantitative   Result Value Ref  Range    HCG BETA <4 mIU/mL   Hold Blue   Result Value Ref Range    HOLD BLUE Tube stored    Differential   Result Value Ref Range    DIFFERENTIAL AUTOMATED    Urinalysis W Reflex To Micro And Cul   Result Value Ref Range    COLOR, UR YELLOW [YELLOW]    APPEARANCE, UR CLEAR [CLEAR]    GLUCOSE, UR NEG [NEGATIVE] mg/dL    KETONES, UR NEG [NEGATIVE] mg/dL    SPEC GRAVITY 1.191 4.782 - 1.030    BLOOD, UR NEG [NEGATIVE]    PH, UR 6.0 5.0 - 8.0    PROTEIN, UR NEG [NEGATIVE] mg/dL    NITRITES, UR NEG [NEGATIVE]    LEUK ESTERASE, UR NEG [NEGATIVE]   RBC Morphology   Result Value Ref Range    MORPHOLOGY See Below (A)     ANISOCYTOSIS 1+     HYPOCHROMIA 3+ (A)     MICROCYTIC 1+     OVALOCYTES 1+     TEARDROP CELLS 1+ (A)    Basic Metabolic Panel   Result Value Ref Range    SODIUM 140 135 - 145 mmol/L    POTASSIUM 4.4 3.5 - 5.1 mmol/L    CHLORIDE 107 98 - 108 mmol/L    CO2 30 22 - 30 mEq/L    ANION GAP 3 (L) 4 - 16 mmol/L    BUN 18 8 - 20 mg/dL    CREATININE 1.0 (H) 0.5 - 0.9 mg/dL    GLUCOSE 956 (H) 65 - 100 mg/dL    CALCIUM 9.4 8.5 - 21.3 mg/dL   eGFR CKD-EPI REFIT   Result Value Ref Range    eGFR CKD-EPI REFIT >60 mL/min   TIBC & Iron   Result Value Ref Range    IRON 15 (L) 50 - 170 ug/dL    TIBC 086 578 - 469 ug/dL    IRON SAT 3 (L) 15 - 55 %   Ferritin   Result Value Ref Range    FERRITIN 2 (L) 30 - 291 ng/mL   BMP   Result Value Ref Range    SODIUM 140 135 - 145 mmol/L    POTASSIUM 4.6 3.5 - 5.1 mmol/L    CHLORIDE 106 98 - 108 mmol/L    CO2 28 22 - 30 mEq/L    ANION GAP 6 4 - 16 mmol/L    BUN 20 8 - 20 mg/dL    CREATININE 1.0 (H) 0.5 - 0.9 mg/dL    GLUCOSE 629  (H) 65 - 100 mg/dL    CALCIUM 9.7 8.5 - 52.8 mg/dL   CBC with Diff   Result Value Ref Range    WBC 12.7 (H) 4.0 - 11.0 10 3/uL    RBC 3.57 (L) 3.80 - 5.20 10 6/uL    HGB 7.7 (L) 12.0 - 16.0 g/dL    HCT 28 (L) 35 - 47 %    MCV 78 (L) 80 - 100 fL    MCH 21.6 (L) 26.0 - 34.0 pg    MCHC 27.5 (L) 31.0 - 37.5 g/dL    RDW 41.3 (H) 0.0 - 24.4 %    PLATELET COUNT 350 150 - 450 10 3/uL    NEUTROPHILS 71 45 - 75 %    LYMPHOCYTES 20 15 - 45 %    MONOCYTES 7 0 - 15 %    EOSINOPHILS 1 0 - 5 %    BASOPHILS 1 0 -  3 %    NEUTROPHIL # 9.0 (H) 1.8 - 8.0 10 3/uL    LYMPHOCYTE # 2.5 1.0 - 4.8 10 3/uL    MONOCYTE # 0.9 0.1 - 1.0 10 3/uL    EOSINOPHIL # 0.2 0.0 - 0.6 10 3/uL    BASOPHIL # 0.1 0.0 - 0.2 10 3/uL   Differential   Result Value Ref Range    DIFFERENTIAL AUTOMATED    eGFR CKD-EPI REFIT   Result Value Ref Range    eGFR CKD-EPI REFIT >60 mL/min   Type and Screen    Result Value Ref Range    ABO RH A Rh Positive     ANTIBODY SCREEN Negative    Blood Bank Order to Prepare PRBC's, 1 Units   Result Value Ref Range    PACKED RBC UNITS 1     DATE REQUIRED, PRBC 10/05/22    Red Cells   Result Value Ref Range    UNIT NUMBER [id number]     PRODUCT CODE E0336V00     DISPENSE STATUS Issued     BLOOD TYPE 6200     PRODUCT RBC LR     CODING SYSTEM [id number]    Blood Bank Order to Prepare PRBC's, 1 Units   Result Value Ref Range    PACKED RBC UNITS 1     DATE REQUIRED, PRBC 10/05/22    Red Cells   Result Value Ref Range    UNIT NUMBER [id number]     PRODUCT CODE [id number]     DISPENSE STATUS Issued     BLOOD TYPE 6200     PRODUCT RBC LR     CODING SYSTEM [id number]        Imaging findings: XR SCOLIOSIS AP AND LATERAL STANDING  Indication:  Pain or limitation of motion.     Comparison:  CT cervical spine 10/05/2022    Technique: 8 images of the spine including AP and lateral radiographs.    Findings/impression:  There is normal alignment of the visualized thoracic spine.  Vertebral body heights and disc spaces are maintained.  No evidence of a  fracture or destructive osseous lesion.    Signed by Attending: Moise Boring, MD on 10/06/2022 2:42 PM  CT chest with IV contrast  Indication: Chest trauma, blunt. Chest pain, unspecified.     Comparison: CT cervical spine 10/05/2022; chest radiographs 12/11/2016.    Technique: Chest CT was performed using 1.25 mm collimation from the lung apices through the lung bases after the uneventful administration of 100 mL of Omnipaque 350. Coronal and sagittal reformats were obtained.    --Findings--    Examination sensitivity and specificity is limited by patient habitus and soft tissue attenuation, as well as respiratory motion.    Lungs/Pleura: Mild bibasilar atelectasis. No pleural effusion. No pneumothorax.    Airway: Central airways appear patent.    Heart: Normal size. No pericardial effusion.    Vasculature: Dilated main pulmonary artery of 3.7 cm. Nonspecific, however can be congenital, idiopathic, or seen in the context of pulmonary hypertension. Thoracic aorta is normal in course and caliber.    Lymph Nodes: No enlarged lymph nodes by CT size criteria in the mediastinum, hilar, or axillary regions.    Thyroid: Partially visualized, unremarkable.    Esophagus: Grossly unremarkable.    Abdomen: Please see separately dictated report for findings below the diaphragm.    Bones: Previously identified T3 and possible T2 fractures are poorly visualized. No other acute fracture is seen.    --  Impression--  1.  No evidence of trauma or acute disease is seen in the chest, markedly limited by patient habitus and soft tissue attenuation, as well as respiratory motion.  2.  Previously identified T3 and possible T2 fractures are poorly visualized.    Approved by: Meryle Ready, MD on 10/05/2022 10:14 PM    The undersigned attending radiologist has personally reviewed the examination and the resident's interpretation thereof, and agrees with the findings.  [employer]    Signed by Attending: Zandra Abts, MD, PhD, FACR on  10/06/2022 7:54 AM  CT Abdomen Pelvis Trauma  Indication: 37 years old Female. Abdominal Pain.     Comparison: 11/22/2018.    Technique: Axial CT images of the abdomen and pelvis after the uneventful IV administration of 100 mL of Omnipaque 350. Unenhanced images were not obtained. Oral contrast was not administered. Coronal and sagittal reformats were also obtained.    --Findings--    Examination sensitivity and specificity is limited by patient habitus and soft tissue attenuation.     -Abdomen-    Lung Bases: Please see separate chest report.  Heart: Please see separate chest report.  Stomach: Normal.  Bowel/Mesentery: Bowel and mesentery are incompletely assessed due to lack of oral contrast, and if there is concern for bowel/mesenteric pathology, imaging with oral contrast is recommended. No obstruction or perforation..  Liver: Normal.  Portal System: Patent.  Gallbladder: Surgically absent. Cholecystectomy clips.  Bile Ducts: Normal.  Spleen: Normal.  Pancreas: Normal.  Adrenal Glands: Normal.  Kidneys: Normal.     -Pelvis-    Appendix: Normal.  Bladder: Normal.  Reproductive Organs: Normal uterus and adnexa status post bilateral tubal ligation.  Free Fluid: None.  Groin: Normal.    -Misc-    Lymph Nodes: No lymphadenopathy.  Vasculature: Patent. Nonaneurysmal.  Soft Tissues: Tiny fat containing umbilical hernia.  Bones: No acute osseous abnormality.    --Impression--  1.  No evidence of trauma or acute disease is seen in the abdomen and pelvis, markedly limited by patient habitus and soft tissue attenuation.    Approved by: Meryle Ready, MD on 10/05/2022 10:19 PM    The undersigned attending radiologist has personally reviewed the examination and the resident's interpretation thereof, and agrees with the findings.  Lovena Le    Signed by Attending: Lovena Le, MD on 10/06/2022 7:28 AM        Assessment/Plan:       37 year old female with past medical history of hypertension, morbid obesity, tobacco use,  iron deficiency anemia in setting of menorrhagia, anxiety, depression, noncompliance presented to ED for evaluation of mechanical fall and admitted to medical Short stay unit for management of T3 function.    T3 fracture  Mechanical fall  Orthopedics on board, plan for nonsurgical management, pain control and physical therapy.  Activity as tolerated.  No brace required.  Thoracic x-ray ordered by Ortho team.  Continue around-the-clock Tylenol, lidocaine.  Adding round-the-clock ibuprofen.  Morphine only for severe pain.  Tizanidine for muscle spasms as needed.  PT evaluation    Microcytic hypochromic anemia  Menorrhagia  Patient received PRBC, hemoglobin 7.7 now  Continue IV Venofer     Hypertension  Continue amlodipine, carvedilol    Asthma  Continue budesonide and as needed albuterol          Medically preferred DVT prophylaxis: Heparin    Author: Silvio Clayman, MD  Note created: 10/06/2022  at: 2:45 PM     Attestation

## 2022-10-07 LAB — UNMAPPED LAB RESULTS
Basophil # (HT): 0.1 10 3/uL (ref 0.0–0.2)
Basophil % (HT): 0 % (ref 0–3)
Eosinophil # (HT): 0.2 10 3/uL (ref 0.0–0.6)
Eosinophil % (HT): 2 % (ref 0–5)
Hematocrit (HT): 29 % — ABNORMAL LOW (ref 35–47)
Hemoglobin (HGB) (HT): 7.8 g/dL — ABNORMAL LOW (ref 12.0–16.0)
Lymphocyte # (HT): 2.4 10 3/uL (ref 1.0–4.8)
Lymphocyte % (HT): 20 % (ref 15–45)
MCHC (HT): 27.1 g/dL — ABNORMAL LOW (ref 31.0–37.5)
MCV (HT): 76 fL — ABNORMAL LOW (ref 80–100)
Mean Corpuscular Hemoglobin (MCH) (HT): 20.7 pg — ABNORMAL LOW (ref 26.0–34.0)
Monocyte # (HT): 0.9 10 3/uL (ref 0.1–1.0)
Monocyte % (HT): 8 % (ref 0–15)
Neutrophil # (HT): 8.3 10 3/uL — ABNORMAL HIGH (ref 1.8–8.0)
Platelets (HT): 335 10 3/uL (ref 150–450)
RBC (HT): 3.77 10 6/uL — ABNORMAL LOW (ref 3.80–5.20)
RDW (HT): 19.9 % — ABNORMAL HIGH (ref 0.0–15.2)
Seg Neut % (HT): 69 % (ref 45–75)
WBC (HT): 12.1 10 3/uL — ABNORMAL HIGH (ref 4.0–11.0)

## 2022-10-07 NOTE — Progress Notes (Signed)
Istop checked in preparation for discharge:  Confidential Drug Utilization Report  Report Date Range: 2021-10-07 - 2022-10-07   (Reference # [id number])    There are no results for this patient search    Ozella Rocks, NP  10/07/22  8:57 AM

## 2022-10-07 NOTE — Progress Notes (Signed)
[facility]  [address]  [place] [address]  Phone: [phone]  Fax: [phone]         October 07, 2022    [name]  [address] [place] [address]       Patient: Jenna [name]   Date of Birth: [birth year]   Date of Visit: 10/05/2022     To Whom It May Concern:    This is to certify that Brittain Anstey was evaluated at [facility] for a medical examination during 7/23-7/25/24. It is my opinion that she may return to work as of 10/08/22, no lifting till cleared by her primary provider.   Sincerely,                 Ozella Rocks, NP  10/07/22  8:56 AM     This note was electronically signed

## 2022-10-07 NOTE — Discharge Summary (Signed)
HOSPITAL DISCHARGE SUMMARY - [facility]    Jenna [name] is a 37 y.o. female, seen by Ozella Rocks, NP on 10/07/2022 at 08:30 am      Hospital Stay Includes: 37 y.o.female with PMH of tobacco use, hypertension, morbid obesity, menorrhagia, iron deficiency anemia, asthma, anxiety/depression, hidradenitis, GERD, medication noncompliance who presented to the ED with injury from fall and noted to have nondisplaced fracture of T3 right inferior articular/transverse process. Pt was seen by ortho spine team, per Dr Terese Door note: "A more dedicated CT was performed in the thoracic region and there is no clear evidence of fracture throughout the thoracic spine.  The images were appropriate and reasonable to interpret and I reviewed multiple series.  There is no malalignment.  I also reviewed the cervical CT as well as the lumbar CT from the abdomen pelvis CT.  There is no sign of traumatic malalignment or definitive evidence of fracture. Upright scoliosis x-rays were also performed and there is no demonstration of any traumatic malalignment.   Assessment status post trauma with upper thoracic pain and no clear sign of fracture.  No sign of neurologic or ligamentous injury.  The patient's symptoms are likely soft tissue in nature and related to contusion.  Her upright x-rays demonstrate stability. She may mobilize as tolerated.  No further follow-up required."   Pt was given pain regimen, worked with PT and cleared to go home with RW. 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   Chief Complaint   Patient presents with    Fall       Pain Scores/Locations    10/05/22 1459 10/05/22 1531 10/05/22 1621 10/05/22 1924   PainSc:   7 10-Worst pain ever   8   7    10/05/22 1957 10/05/22 2100 10/05/22 2248 10/06/22 0031   PainSc:   8   5 0-No pain   7    10/06/22 0531 10/06/22 0847 10/06/22 0854 10/06/22  1314   PainSc:   8   8   8   8     10/06/22 1624 10/06/22 2037 10/07/22 0408 10/07/22 1123   PainSc:   7   8   8   6         Vitals: Temp:  [36.3 C (97.3 F)-36.8 C (98.2 F)] 36.8 C (98.2 F)  Pulse:  [66-73] 72  Resp:  [14-24] 19  BP: (126-142)/(65-87) 141/87     Physical Examination:   Physical Exam  Vitals reviewed.   Constitutional:       General: She is not in acute distress.     Appearance: She is obese. She is not ill-appearing, toxic-appearing or diaphoretic.   HENT:      Head: Normocephalic and atraumatic.      Right Ear: External ear normal.      Left Ear: External ear normal.      Mouth/Throat:      Mouth: Mucous membranes are moist.      Pharynx: Oropharynx is clear.   Eyes:      General: No scleral icterus.        Right eye: No discharge.         Left eye: No discharge.      Conjunctiva/sclera: Conjunctivae normal.   Cardiovascular:      Rate and Rhythm: Normal rate.  Heart sounds: Normal heart sounds.   Pulmonary:      Effort: Pulmonary effort is normal.      Breath sounds: Normal breath sounds.   Abdominal:      General: There is no distension.      Tenderness: There is no abdominal tenderness.      Comments: obese   Musculoskeletal:      Right lower leg: No edema.      Left lower leg: No edema.      Comments: Diffusely tender at mid back over muscles    Skin:     General: Skin is warm.   Neurological:      General: No focal deficit present.      Mental Status: She is alert and oriented to person, place, and time.   Psychiatric:         Mood and Affect: Mood normal.         Behavior: Behavior normal.         Lab Results:   Results for orders placed or performed during the hospital encounter of 10/05/22   CBC and differential   Result Value Ref Range    WBC 12.7 (H) 4.0 - 11.0 10 3/uL    RBC 3.25 (L) 3.80 - 5.20 10 6/uL    HGB 6.2 (L) 12.0 - 16.0 g/dL    HCT 24 (L) 35 - 47 %    MCV 73 (L) 80 - 100 fL    MCH 19.1 (L) 26.0 - 34.0 pg    MCHC 26.2 (L) 31.0 - 37.5 g/dL    RDW 09.8 (H) 0.0 - 11.9 %     PLATELET COUNT 367 150 - 450 10 3/uL    NEUTROPHILS 74 45 - 75 %    LYMPHOCYTES 17 15 - 45 %    MONOCYTES 7 0 - 15 %    EOSINOPHILS 1 0 - 5 %    BASOPHILS 0 0 - 3 %    NEUTROPHIL # 9.3 (H) 1.8 - 8.0 10 3/uL    LYMPHOCYTE # 2.2 1.0 - 4.8 10 3/uL    MONOCYTE # 0.9 0.1 - 1.0 10 3/uL    EOSINOPHIL # 0.1 0.0 - 0.6 10 3/uL    BASOPHIL # 0.1 0.0 - 0.2 10 3/uL   HCG, Quantitative   Result Value Ref Range    HCG BETA <4 mIU/mL   Hold Blue   Result Value Ref Range    HOLD BLUE Tube stored    Differential   Result Value Ref Range    DIFFERENTIAL AUTOMATED    Urinalysis W Reflex To Micro And Cul   Result Value Ref Range    COLOR, UR YELLOW [YELLOW]    APPEARANCE, UR CLEAR [CLEAR]    GLUCOSE, UR NEG [NEGATIVE] mg/dL    KETONES, UR NEG [NEGATIVE] mg/dL    SPEC GRAVITY 1.478 2.956 - 1.030    BLOOD, UR NEG [NEGATIVE]    PH, UR 6.0 5.0 - 8.0    PROTEIN, UR NEG [NEGATIVE] mg/dL    NITRITES, UR NEG [NEGATIVE]    LEUK ESTERASE, UR NEG [NEGATIVE]   RBC Morphology   Result Value Ref Range    MORPHOLOGY See Below (A)     ANISOCYTOSIS 1+     HYPOCHROMIA 3+ (A)     MICROCYTIC 1+     OVALOCYTES 1+     TEARDROP CELLS 1+ (A)    Basic Metabolic Panel   Result Value Ref Range  SODIUM 140 135 - 145 mmol/L    POTASSIUM 4.4 3.5 - 5.1 mmol/L    CHLORIDE 107 98 - 108 mmol/L    CO2 30 22 - 30 mEq/L    ANION GAP 3 (L) 4 - 16 mmol/L    BUN 18 8 - 20 mg/dL    CREATININE 1.0 (H) 0.5 - 0.9 mg/dL    GLUCOSE 096 (H) 65 - 100 mg/dL    CALCIUM 9.4 8.5 - 04.5 mg/dL   eGFR CKD-EPI REFIT   Result Value Ref Range    eGFR CKD-EPI REFIT >60 mL/min   TIBC & Iron   Result Value Ref Range    IRON 15 (L) 50 - 170 ug/dL    TIBC 409 811 - 914 ug/dL    IRON SAT 3 (L) 15 - 55 %   Ferritin   Result Value Ref Range    FERRITIN 2 (L) 30 - 291 ng/mL   BMP   Result Value Ref Range    SODIUM 140 135 - 145 mmol/L    POTASSIUM 4.6 3.5 - 5.1 mmol/L    CHLORIDE 106 98 - 108 mmol/L    CO2 28 22 - 30 mEq/L    ANION GAP 6 4 - 16 mmol/L    BUN 20 8 - 20 mg/dL    CREATININE 1.0 (H) 0.5  - 0.9 mg/dL    GLUCOSE 782 (H) 65 - 100 mg/dL    CALCIUM 9.7 8.5 - 95.6 mg/dL   CBC with Diff   Result Value Ref Range    WBC 12.7 (H) 4.0 - 11.0 10 3/uL    RBC 3.57 (L) 3.80 - 5.20 10 6/uL    HGB 7.7 (L) 12.0 - 16.0 g/dL    HCT 28 (L) 35 - 47 %    MCV 78 (L) 80 - 100 fL    MCH 21.6 (L) 26.0 - 34.0 pg    MCHC 27.5 (L) 31.0 - 37.5 g/dL    RDW 21.3 (H) 0.0 - 08.6 %    PLATELET COUNT 350 150 - 450 10 3/uL    NEUTROPHILS 71 45 - 75 %    LYMPHOCYTES 20 15 - 45 %    MONOCYTES 7 0 - 15 %    EOSINOPHILS 1 0 - 5 %    BASOPHILS 1 0 - 3 %    NEUTROPHIL # 9.0 (H) 1.8 - 8.0 10 3/uL    LYMPHOCYTE # 2.5 1.0 - 4.8 10 3/uL    MONOCYTE # 0.9 0.1 - 1.0 10 3/uL    EOSINOPHIL # 0.2 0.0 - 0.6 10 3/uL    BASOPHIL # 0.1 0.0 - 0.2 10 3/uL   Differential   Result Value Ref Range    DIFFERENTIAL AUTOMATED    eGFR CKD-EPI REFIT   Result Value Ref Range    eGFR CKD-EPI REFIT >60 mL/min   BMP   Result Value Ref Range    SODIUM 140 135 - 145 mmol/L    POTASSIUM 4.4 3.5 - 5.1 mmol/L    CHLORIDE 106 98 - 108 mmol/L    CO2 30 22 - 30 mEq/L    ANION GAP 4 4 - 16 mmol/L    BUN 22 (H) 8 - 20 mg/dL    CREATININE 0.8 0.5 - 0.9 mg/dL    GLUCOSE 97 65 - 578 mg/dL    CALCIUM 9.4 8.5 - 46.9 mg/dL   CBC with Diff   Result Value Ref Range  WBC 12.1 (H) 4.0 - 11.0 10 3/uL    RBC 3.77 (L) 3.80 - 5.20 10 6/uL    HGB 7.8 (L) 12.0 - 16.0 g/dL    HCT 29 (L) 35 - 47 %    MCV 76 (L) 80 - 100 fL    MCH 20.7 (L) 26.0 - 34.0 pg    MCHC 27.1 (L) 31.0 - 37.5 g/dL    RDW 16.1 (H) 0.0 - 09.6 %    PLATELET COUNT 335 150 - 450 10 3/uL    NEUTROPHILS 69 45 - 75 %    LYMPHOCYTES 20 15 - 45 %    MONOCYTES 8 0 - 15 %    EOSINOPHILS 2 0 - 5 %    BASOPHILS 0 0 - 3 %    NEUTROPHIL # 8.3 (H) 1.8 - 8.0 10 3/uL    LYMPHOCYTE # 2.4 1.0 - 4.8 10 3/uL    MONOCYTE # 0.9 0.1 - 1.0 10 3/uL    EOSINOPHIL # 0.2 0.0 - 0.6 10 3/uL    BASOPHIL # 0.1 0.0 - 0.2 10 3/uL   Differential   Result Value Ref Range    DIFFERENTIAL AUTOMATED    eGFR CKD-EPI REFIT   Result Value Ref Range    eGFR CKD-EPI  REFIT >60 mL/min   Type and Screen    Result Value Ref Range    ABO RH A Rh Positive     ANTIBODY SCREEN Negative    Blood Bank Order to Prepare PRBC's, 1 Units   Result Value Ref Range    PACKED RBC UNITS 1     DATE REQUIRED, PRBC 10/05/22    Red Cells   Result Value Ref Range    UNIT NUMBER [id number]     PRODUCT CODE E0336V00     DISPENSE STATUS Transfused     BLOOD TYPE 6200     PRODUCT RBC LR     CODING SYSTEM [id number]    Blood Bank Order to Prepare PRBC's, 1 Units   Result Value Ref Range    PACKED RBC UNITS 1     DATE REQUIRED, PRBC 10/05/22    Red Cells   Result Value Ref Range    UNIT NUMBER [id number]     PRODUCT CODE Q4696E95     DISPENSE STATUS Transfused     BLOOD TYPE 6200     PRODUCT RBC LR     CODING SYSTEM [id number]        Imaging findings: XR SCOLIOSIS AP AND LATERAL STANDING  Indication:  Pain or limitation of motion.     Comparison:  CT cervical spine 10/05/2022    Technique: 8 images of the spine including AP and lateral radiographs.    Findings/impression:  There is normal alignment of the visualized thoracic spine.  Vertebral body heights and disc spaces are maintained.  No evidence of a fracture or destructive osseous lesion.    Signed by Attending: Moise Boring, MD on 10/06/2022 2:42 PM  CT chest with IV contrast  Indication: Chest trauma, blunt. Chest pain, unspecified.     Comparison: CT cervical spine 10/05/2022; chest radiographs 12/11/2016.    Technique: Chest CT was performed using 1.25 mm collimation from the lung apices through the lung bases after the uneventful administration of 100 mL of Omnipaque 350. Coronal and sagittal reformats were obtained.    --Findings--    Examination sensitivity and specificity is limited by patient habitus and soft tissue attenuation, as well as respiratory  motion.    Lungs/Pleura: Mild bibasilar atelectasis. No pleural effusion. No pneumothorax.    Airway: Central airways appear patent.    Heart: Normal size. No pericardial effusion.    Vasculature:  Dilated main pulmonary artery of 3.7 cm. Nonspecific, however can be congenital, idiopathic, or seen in the context of pulmonary hypertension. Thoracic aorta is normal in course and caliber.    Lymph Nodes: No enlarged lymph nodes by CT size criteria in the mediastinum, hilar, or axillary regions.    Thyroid: Partially visualized, unremarkable.    Esophagus: Grossly unremarkable.    Abdomen: Please see separately dictated report for findings below the diaphragm.    Bones: Previously identified T3 and possible T2 fractures are poorly visualized. No other acute fracture is seen.    --Impression--  1.  No evidence of trauma or acute disease is seen in the chest, markedly limited by patient habitus and soft tissue attenuation, as well as respiratory motion.  2.  Previously identified T3 and possible T2 fractures are poorly visualized.    Approved by: Meryle Ready, MD on 10/05/2022 10:14 PM    The undersigned attending radiologist has personally reviewed the examination and the resident's interpretation thereof, and agrees with the findings.  [employer]    Signed by Attending: Zandra Abts, MD, PhD, FACR on 10/06/2022 7:54 AM  CT Abdomen Pelvis Trauma  Indication: 37 years old Female. Abdominal Pain.     Comparison: 11/22/2018.    Technique: Axial CT images of the abdomen and pelvis after the uneventful IV administration of 100 mL of Omnipaque 350. Unenhanced images were not obtained. Oral contrast was not administered. Coronal and sagittal reformats were also obtained.    --Findings--    Examination sensitivity and specificity is limited by patient habitus and soft tissue attenuation.     -Abdomen-    Lung Bases: Please see separate chest report.  Heart: Please see separate chest report.  Stomach: Normal.  Bowel/Mesentery: Bowel and mesentery are incompletely assessed due to lack of oral contrast, and if there is concern for bowel/mesenteric pathology, imaging with oral contrast is recommended. No obstruction or  perforation..  Liver: Normal.  Portal System: Patent.  Gallbladder: Surgically absent. Cholecystectomy clips.  Bile Ducts: Normal.  Spleen: Normal.  Pancreas: Normal.  Adrenal Glands: Normal.  Kidneys: Normal.     -Pelvis-    Appendix: Normal.  Bladder: Normal.  Reproductive Organs: Normal uterus and adnexa status post bilateral tubal ligation.  Free Fluid: None.  Groin: Normal.    -Misc-    Lymph Nodes: No lymphadenopathy.  Vasculature: Patent. Nonaneurysmal.  Soft Tissues: Tiny fat containing umbilical hernia.  Bones: No acute osseous abnormality.    --Impression--  1.  No evidence of trauma or acute disease is seen in the abdomen and pelvis, markedly limited by patient habitus and soft tissue attenuation.    Approved by: Meryle Ready, MD on 10/05/2022 10:19 PM    The undersigned attending radiologist has personally reviewed the examination and the resident's interpretation thereof, and agrees with the findings.  Lovena Le    Signed by Attending: Lovena Le, MD on 10/06/2022 7:28 AM      Cardiac Testing: Telemetry: no ectopy     Consults: Orthopedics, Physical Therapy and Social Work     Assessment/Plan: 36 y.o.female with PMH of tobacco use, hypertension, morbid obesity, menorrhagia, iron deficiency anemia, asthma, anxiety/depression, hidradenitis, GERD, medication noncompliance who presented to the ED with injury from fall and noted to have nondisplaced fracture of T3  right inferior articular/transverse process. Pt was seen by ortho spine team, per Dr Terese Door note: "A more dedicated CT was performed in the thoracic region and there is no clear evidence of fracture throughout the thoracic spine.  The images were appropriate and reasonable to interpret and I reviewed multiple series.  There is no malalignment.  I also reviewed the cervical CT as well as the lumbar CT from the abdomen pelvis CT.  There is no sign of traumatic malalignment or definitive evidence of fracture. Upright scoliosis x-rays were also  performed and there is no demonstration of any traumatic malalignment.   Assessment status post trauma with upper thoracic pain and no clear sign of fracture.  No sign of neurologic or ligamentous injury.  The patient's symptoms are likely soft tissue in nature and related to contusion.  Her upright x-rays demonstrate stability. She may mobilize as tolerated.  No further follow-up required."   Pt was given pain regimen, worked with PT and cleared to go home with RW. 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     Medication changes:  Celebrex 200 mg twice a day till back pain is well controlled then can use on as need it basis-do not use any other over the counter pain medications as they can interact and cause kidney injury or stomach bleeding   Tylenol 1000 mg 3 times a day for a week then if back pain is stable use on as need it basis  Morphine 7.5 mg every 6 hours as need it for severe pain only-it is narcotic so can make constipated and do not drive while taking it  Zanaflex 4 mg every 6 hours as need it for muscle spasms  Lidocaine patch topically 12 hours on/12 hours off to back area    You noted to have low Iron and given IV 2 doses-resume oral Iron once go home-take with glass of orange juice for better absorption       Disposition: home  Follow-up:  with in 1 week. with PCP and ortho spine prn  Smoking Cessation: I advised patient to quit, and offered support.    Diagnoses that have been ruled out:   None   Diagnoses that are still under consideration:   None   Final diagnoses:   Anemia, unspecified type   Closed fracture of second thoracic vertebra, unspecified fracture morphology, initial encounter   Closed fracture of third thoracic vertebra, unspecified fracture morphology, initial encounter       Author: Ozella Rocks, NP  Note created: 10/07/2022  at: 11:53 AM      Attestation

## 2022-10-08 ENCOUNTER — Telehealth: Payer: MEDICAID | Admitting: Emergency Medicine

## 2022-10-08 DIAGNOSIS — F419 Anxiety disorder, unspecified: Secondary | ICD-10-CM | POA: Diagnosis not present

## 2022-10-08 NOTE — Progress Notes (Signed)
Virtual Visit Consent   Samantha Nguyen, you are scheduled for a virtual visit with a [facility] provider today. Just as with appointments in the office, your consent must be obtained to participate. Your consent will be active for this visit and any virtual visit you may have with one of our providers in the next 365 days. If you have a MyChart account, a copy of this consent can be sent to you electronically.  As this is a virtual visit, video technology does not allow for your provider to perform a traditional examination. This may limit your provider's ability to fully assess your condition. If your provider identifies any concerns that need to be evaluated in person or the need to arrange testing (such as labs, EKG, etc.), we will make arrangements to do so. Although advances in technology are sophisticated, we cannot ensure that it will always work on either your end or our end. If the connection with a video visit is poor, the visit may have to be switched to a telephone visit. With either a video or telephone visit, we are not always able to ensure that we have a secure connection.  By engaging in this virtual visit, you consent to the provision of healthcare and authorize for your insurance to be billed (if applicable) for the services provided during this visit. Depending on your insurance coverage, you may receive a charge related to this service.  I need to obtain your verbal consent now. Are you willing to proceed with your visit today? Mattye Reczek has provided verbal consent on 10/08/2022 for a virtual visit (video or telephone). Roxy Horseman, PA-C  Date: 10/08/2022 11:22 AM  Virtual Visit via Video Note   I, Roxy Horseman, connected with  Zachariah Damo  ([id number], [birth year]) on 10/08/22 at 11:15 AM EDT by a video-enabled telemedicine application and verified that I am speaking with the correct person using two identifiers.  Location: Patient: Virtual Visit Location Patient:  Home Provider: Virtual Visit Location Provider: Home Office   I discussed the limitations of evaluation and management by telemedicine and the availability of in person appointments. The patient expressed understanding and agreed to proceed.    History of Present Illness: Samantha Nguyen is a 37 y.o. who identifies as a female who was assigned female at birth, and is being seen today for anxiousness.  She states that she is concerned about a recent elevated d-dimer from an ER visit.  She had subsequent CT angio, which was reassuring.  She states that she has bad anxiety and is scheduled to see a therapist.  She has been taking Paxil and Xanax.  She denies any symptoms other than anxiety and fear surrounding the elevated d-dimer.  HPI: HPI  Problems:  Patient Active Problem List   Diagnosis Date Noted   Other chest pain 09/02/2022   Gastroesophageal reflux disease without esophagitis 09/02/2022   Mixed hyperlipidemia 09/02/2022   Costochondritis 09/02/2022   Elevated blood pressure reading 07/27/2022   Tension-type headache, not intractable 07/27/2022   MDD (major depressive disorder), recurrent episode, moderate (HCC) 05/02/2022   Generalized anxiety disorder 05/02/2022   Illness anxiety disorder 04/28/2022   MDD (major depressive disorder), single episode, severe (HCC) 04/28/2022   Sepsis due to urinary tract infection (HCC) 08/24/2016    Allergies:  Allergies  Allergen Reactions   Sulfa Antibiotics Other (See Comments)    Unknown, happened when she was a child   Medications:  Current Outpatient Medications:    rosuvastatin (CRESTOR) 10 MG  tablet, Take 1 tablet (10 mg total) by mouth daily., Disp: 90 tablet, Rfl: 3   ALPRAZolam (XANAX) 0.5 MG tablet, Take 1 tablet (0.5 mg total) by mouth 2 (two) times daily as needed for sleep or anxiety., Disp: 10 tablet, Rfl: 0   doxycycline (VIBRA-TABS) 100 MG tablet, Take 1 tablet (100 mg total) by mouth 2 (two) times daily., Disp: 20 tablet, Rfl:  0   pantoprazole (PROTONIX) 40 MG tablet, Take 1 tablet (40 mg total) by mouth daily., Disp: 30 tablet, Rfl: 1   PARoxetine (PAXIL) 20 MG tablet, Take 1 tablet (20 mg total) by mouth daily., Disp: 30 tablet, Rfl: 0  Observations/Objective: Patient is well-developed, well-nourished in no acute distress.  Resting comfortably at home.  Head is normocephalic, atraumatic.  No labored breathing.  Speech is clear and coherent with logical content.  Patient is alert and oriented at baseline.  Anxious and tearful  Assessment and Plan: 1. Anxiousness   We reviewed the patient's recent ER workup, test results, and CT scan.  Patient given reassurance.  She will follow-up with her therapist about her anxiety.  Follow Up Instructions: I discussed the assessment and treatment plan with the patient. The patient was provided an opportunity to ask questions and all were answered. The patient agreed with the plan and demonstrated an understanding of the instructions.  A copy of instructions were sent to the patient via MyChart unless otherwise noted below.     The patient was advised to call back or seek an in-person evaluation if the symptoms worsen or if the condition fails to improve as anticipated.  Time:  I spent 11 minutes with the patient via telehealth technology discussing the above problems/concerns.    Roxy Horseman, PA-C

## 2022-10-08 NOTE — Patient Instructions (Signed)
  [name], thank you for joining Roxy Horseman, PA-C for today's virtual visit.  While this provider is not your primary care provider (PCP), if your PCP is located in our provider database this encounter information will be shared with them immediately following your visit.   A [facility] MyChart account gives you access to today's visit and all your visits, tests, and labs performed at [facility] " click here if you don't have a [facility] MyChart account or go to [email or web address]  Consent: (Patient) Samantha Nguyen provided verbal consent for this virtual visit at the beginning of the encounter.  Current Medications:  Current Outpatient Medications:    rosuvastatin (CRESTOR) 10 MG tablet, Take 1 tablet (10 mg total) by mouth daily., Disp: 90 tablet, Rfl: 3   ALPRAZolam (XANAX) 0.5 MG tablet, Take 1 tablet (0.5 mg total) by mouth 2 (two) times daily as needed for sleep or anxiety., Disp: 10 tablet, Rfl: 0   doxycycline (VIBRA-TABS) 100 MG tablet, Take 1 tablet (100 mg total) by mouth 2 (two) times daily., Disp: 20 tablet, Rfl: 0   pantoprazole (PROTONIX) 40 MG tablet, Take 1 tablet (40 mg total) by mouth daily., Disp: 30 tablet, Rfl: 1   PARoxetine (PAXIL) 20 MG tablet, Take 1 tablet (20 mg total) by mouth daily., Disp: 30 tablet, Rfl: 0   Medications ordered in this encounter:  No orders of the defined types were placed in this encounter.    *If you need refills on other medications prior to your next appointment, please contact your pharmacy*  Follow-Up: Call back or seek an in-person evaluation if the symptoms worsen or if the condition fails to improve as anticipated.  [facility] [phone]  Other Instructions    If you have been instructed to have an in-person evaluation today at a local Urgent Care facility, please use the link below. It will take you to a list of all of our available [facility], including address,  phone number and hours of operation. Please do not delay care.  [facility]  If you or a family member do not have a primary care provider, use the link below to schedule a visit and establish care. When you choose a [facility] primary care physician or advanced practice provider, you gain a long-term partner in health. Find a Primary Care Provider  Learn more about [facility]'s in-office and virtual care options: [facility] - Get Care Now

## 2022-10-12 ENCOUNTER — Emergency Department (HOSPITAL_COMMUNITY)
Admission: EM | Admit: 2022-10-12 | Discharge: 2022-10-12 | Disposition: A | Discharge status: Home / Self Care | Payer: MEDICAID | Attending: Emergency Medicine | Admitting: Emergency Medicine

## 2022-10-12 ENCOUNTER — Other Ambulatory Visit: Payer: Self-pay

## 2022-10-12 ENCOUNTER — Encounter (HOSPITAL_COMMUNITY): Payer: Self-pay

## 2022-10-12 DIAGNOSIS — F410 Panic disorder [episodic paroxysmal anxiety] without agoraphobia: Secondary | ICD-10-CM | POA: Insufficient documentation

## 2022-10-12 LAB — I-STAT CHEM 8, ED
BUN: 6 mg/dL (ref 6–20)
Calcium, Ion: 1.22 mmol/L (ref 1.15–1.40)
Chloride: 106 mmol/L (ref 98–111)
Creatinine, Ser: 0.7 mg/dL (ref 0.44–1.00)
Glucose, Bld: 104 mg/dL — ABNORMAL HIGH (ref 70–99)
HCT: 41 % (ref 36.0–46.0)
Hemoglobin: 13.9 g/dL (ref 12.0–15.0)
Potassium: 3.7 mmol/L (ref 3.5–5.1)
Sodium: 139 mmol/L (ref 135–145)
TCO2: 23 mmol/L (ref 22–32)

## 2022-10-12 MED ORDER — LORAZEPAM 1 MG PO TABS
1.0000 mg | ORAL_TABLET | Freq: Once | ORAL | Status: AC
  Administered 2022-10-12: 1 mg via ORAL
  Filled 2022-10-12: qty 1

## 2022-10-12 NOTE — ED Notes (Signed)
Pt more calm at this time 

## 2022-10-12 NOTE — ED Triage Notes (Addendum)
Pt arrived via [facility] for panic attack since this AM. Pt is prescribed xanax for this, but she doesn't take it because she doesn't want to get addicted. Pt expressing having PTSD and reports that her mom is in the hospital with a kidney infection and that when she was here as a pt, that is what she had and she was septic. Pt w/ impending sense of doom. Using therapeutic communication and guided meditation to help pt at this time.

## 2022-10-12 NOTE — Discharge Instructions (Signed)
You were evaluated here with labs.  These appear to be within normal limits.  Please take your Xanax as needed at home and follow-up with your primary care provider.

## 2022-10-12 NOTE — ED Provider Notes (Signed)
[place] EMERGENCY DEPARTMENT AT [facility] Provider Note   CSN: [id number] Arrival date & time: 10/12/22  1040     History  Chief Complaint  Patient presents with   Anxiety   Requesting anxiety medication change    Samantha Nguyen is a 37 y.o. female.  HPI 37 year old female history of anxiety depression presents today with anxiety.  She states that she has been worried since she had a CAT scan of her chest the other day.  She states that she took an Atarax last night.  She has Xanax which does not wish to take it.  She has 2 behavioral health providers.  She has not been able to get into see them.  She denies suicidal or homicidal or psychosis.    Home Medications Prior to Admission medications   Medication Sig Start Date End Date Taking? Authorizing Provider  rosuvastatin (CRESTOR) 10 MG tablet Take 1 tablet (10 mg total) by mouth daily. 09/03/22   Margaretann Loveless, MD  ALPRAZolam Prudy Feeler) 0.5 MG tablet Take 1 tablet (0.5 mg total) by mouth 2 (two) times daily as needed for sleep or anxiety. 07/27/22 07/27/23  Orson Eva, NP  doxycycline (VIBRA-TABS) 100 MG tablet Take 1 tablet (100 mg total) by mouth 2 (two) times daily. 09/09/22   Margaretann Loveless, PA-C  pantoprazole (PROTONIX) 40 MG tablet Take 1 tablet (40 mg total) by mouth daily. 09/02/22 09/02/23  Margaretann Loveless, MD  PARoxetine (PAXIL) 20 MG tablet Take 1 tablet (20 mg total) by mouth daily. 05/18/22   Bobbye Morton, MD      Allergies    Sulfa antibiotics    Review of Systems   Review of Systems  Physical Exam Updated Vital Signs BP (!) 140/89   Pulse (!) 104   Temp 98.6 F (37 C)   Resp 18   Ht 1.702 m (5\' 7" )   Wt 76.2 kg   LMP 09/12/2022 (Approximate)   SpO2 100% Comment: Simultaneous filing. User may not have seen previous data.  BMI 26.31 kg/m  Physical Exam Vitals and nursing note reviewed.  Constitutional:      General: She is not in acute distress.    Appearance: She is  well-developed.  HENT:     Head: Normocephalic and atraumatic.     Right Ear: External ear normal.     Left Ear: External ear normal.     Nose: Nose normal.  Eyes:     Conjunctiva/sclera: Conjunctivae normal.     Pupils: Pupils are equal, round, and reactive to light.  Cardiovascular:     Pulses: Normal pulses.  Pulmonary:     Effort: Pulmonary effort is normal.  Abdominal:     General: Abdomen is flat. Bowel sounds are normal.  Musculoskeletal:        General: Normal range of motion.     Cervical back: Normal range of motion and neck supple.  Skin:    General: Skin is warm and dry.  Neurological:     Mental Status: She is alert and oriented to person, place, and time.     Motor: No abnormal muscle tone.     Coordination: Coordination normal.  Psychiatric:        Attention and Perception: Attention normal.        Mood and Affect: Mood is anxious. Affect is tearful.        Speech: Speech normal.        Behavior: Behavior normal.  Thought Content: Thought content normal.        Cognition and Memory: Cognition normal.        Judgment: Judgment normal.     ED Results / Procedures / Treatments   Labs (all labs ordered are listed, but only abnormal results are displayed) Labs Reviewed  I-STAT CHEM 8, ED - Abnormal; Notable for the following components:      Result Value   Glucose, Bld 104 (*)    All other components within normal limits    EKG None  Radiology No results found.  Procedures Procedures    Medications Ordered in ED Medications  LORazepam (ATIVAN) tablet 1 mg (1 mg Oral Given 10/12/22 1109)    ED Course/ Medical Decision Making/ A&P Clinical Course as of 10/12/22 1214  Tue Oct 12, 2022  1210 I-STAT reviewed interpreted and within normal limits Reviewed prior pregnancy test from several days ago which is negative [DR]    Clinical Course User Index [DR] Margarita Grizzle, MD                                 Medical Decision  Making Risk Prescription drug management.   Patient presents today with symptoms consistent with anxiety.  She does not have other complaints of chest pain or dyspnea.  Patient was evaluated here in the ED 1 week ago for chest pain.  At that time she had a normal workup with a normal CT of her chest.  Troponins were trended and were normal. Patient evaluation today with normal i-STAT slightly tachycardic initially.  She is given Ativan. She does not appear to have any acute decompensation that would require hospitalization.  I discussed need for her to have close follow-up with her behavioral health provider she voices understanding.        Final Clinical Impression(s) / ED Diagnoses Final diagnoses:  Anxiety attack    Rx / DC Orders ED Discharge Orders     None         Margarita Grizzle, MD 10/12/22 1214

## 2022-10-12 NOTE — ED Notes (Signed)
Extra tubes drawn and sent to lab. Lt green, gold, lav

## 2022-10-12 NOTE — ED Notes (Signed)
Pt continuing w/ using pop it fidget toy and listening to guided meditations on her phone. Providing reassurance to pt that she is not dying and that it is just her nervous system overstimulated and that she is going to be ok.

## 2022-10-12 NOTE — ED Notes (Signed)
Reviewed discharge instructions with patient. Follow-up care and resources reviewed. Patient verbalized understanding. Patient A&Ox4, VSS, and ambulatory with steady gait upon discharge.

## 2022-10-18 ENCOUNTER — Telehealth: Payer: Self-pay | Admitting: Internal Medicine

## 2022-10-18 NOTE — Telephone Encounter (Signed)
Copied from CRM #[id number]. Topic: Appointments - Schedule Appointment  >> Oct 18, 2022 12:45 PM  B wrote:  Jenna [name], Jenna [name]  calling to report she is experiencing a few symptoms:falling to sleep at any given time, spouse told her she's foaming at the month when she's sleep, body pain w/kidney, heel of right foot pain hard to walk, fractured spin was seen at [facility] last week (fell at home).     These symptoms have been going on for longer a month(s)    Patient requesting same day appointment? Patient is asking to be seen tomorrow (10/19/22) but was scheduled for a hospital discharge Wednesday (10/20/22) , need transportation, plus 1 rider, walker, use [employer]).  Address verified and insurance is [employer].     Patient requesting call back? yes    Phone number confirmed at [phone]

## 2022-10-19 NOTE — Telephone Encounter (Signed)
Jenna [name]  [id number]  [address]  [place] [address]     REFERRAL FROM:   [facility]: Social Work Follow Up    PRESENTING PROBLEM:                 Transportation    ADDITIONAL INFORMATION:  Patient requesting assistance in setting up transportation for upcoming appointment. Patient was due to recertify for Medicaid in July and now has a lapse in coverage. Writer called Therapist, art and confirmed that they could accommodate this ride. Writer faxed [employer] a [employer] Social Work Oncologist.   Writer called patient and was able to speak with her. Patient reports that she needs a ride with more than one additional rider. Writer informed patient that she does not have medicaid at this time and that [facility] can only contract rides with certain vendors and they allow only one additional rider. Patient reports that she does have medicaid at this time. Writer checked [employer] as well as the [employer] portal and both databases report that patient does not have [employer] coverage at this time and that she was due to recertify in July. Writer instructed patient to call [employer] to find out what is happening with her medicaid. Writer relayed transportation details to patient. Patient verbalized understanding.     ACTION TAKEN:  TRANSPORTATION   ARRANGEMENTS:  Appointment: 8/7  @ 3:40 PM  Pick-up time @ 2:40 PM  Pick-up location  [address]  [place] [address]        [employer] is   Stage manager # [phone]  *Transportation is round trip  [employer]

## 2022-10-20 ENCOUNTER — Ambulatory Visit: Payer: Self-pay
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
## 2022-10-21 ENCOUNTER — Encounter: Payer: Self-pay | Admitting: Internal Medicine

## 2022-10-25 ENCOUNTER — Inpatient Hospital Stay: Admission: RE | Admit: 2022-10-25 | Payer: Self-pay | Source: Ambulatory Visit

## 2022-10-25 ENCOUNTER — Other Ambulatory Visit: Payer: Self-pay

## 2022-10-25 ENCOUNTER — Ambulatory Visit: Payer: Medicaid Other | Attending: Primary Care

## 2022-10-25 ENCOUNTER — Telehealth: Payer: Self-pay | Admitting: Internal Medicine

## 2022-10-25 VITALS — BP 164/100 | HR 83 | Temp 97.7°F | Ht 65.0 in | Wt 359.0 lb

## 2022-10-25 DIAGNOSIS — J4550 Severe persistent asthma, uncomplicated: Secondary | ICD-10-CM

## 2022-10-25 DIAGNOSIS — Z131 Encounter for screening for diabetes mellitus: Secondary | ICD-10-CM

## 2022-10-25 DIAGNOSIS — R002 Palpitations: Secondary | ICD-10-CM

## 2022-10-25 DIAGNOSIS — D500 Iron deficiency anemia secondary to blood loss (chronic): Secondary | ICD-10-CM

## 2022-10-25 DIAGNOSIS — M79671 Pain in right foot: Secondary | ICD-10-CM

## 2022-10-25 DIAGNOSIS — R0600 Dyspnea, unspecified: Secondary | ICD-10-CM

## 2022-10-25 DIAGNOSIS — Z9189 Other specified personal risk factors, not elsewhere classified: Secondary | ICD-10-CM

## 2022-10-25 DIAGNOSIS — Z13220 Encounter for screening for lipoid disorders: Secondary | ICD-10-CM

## 2022-10-25 MED ORDER — ALBUTEROL SULFATE HFA 108 (90 BASE) MCG/ACT IN AERS *I*
1.0000 | INHALATION_SPRAY | Freq: Four times a day (QID) | RESPIRATORY_TRACT | 3 refills | Status: DC | PRN
Start: 2022-10-25 — End: 2023-12-09
  Filled 2022-10-25: qty 18, 25d supply, fill #0
  Filled 2022-11-04: qty 18, 17d supply, fill #0

## 2022-10-25 MED ORDER — BUDESONIDE-FORMOTEROL FUMARATE 160-4.5 MCG/ACT IN AERO *I*
2.0000 | INHALATION_SPRAY | Freq: Two times a day (BID) | RESPIRATORY_TRACT | 5 refills | Status: DC
Start: 2022-10-25 — End: 2023-08-20
  Filled 2022-10-25 – 2022-11-04 (×2): qty 10.2, 30d supply, fill #0

## 2022-10-25 MED ORDER — CARVEDILOL 6.25 MG PO TABS *I*
12.5000 mg | ORAL_TABLET | Freq: Two times a day (BID) | ORAL | 1 refills | Status: DC
Start: 2022-10-25 — End: 2023-07-01
  Filled 2022-10-25 – 2022-11-04 (×2): qty 360, 90d supply, fill #0

## 2022-10-25 NOTE — Telephone Encounter (Signed)
Copied from CRM [id number]. Topic: Access to Care - Speak to Provider/Office Staff  >> Oct 25, 2022  1:47 PM Sharlett Iles S wrote:  Patient is calling to inform Dr.Joshi she is running late to her appointment, Patient stated she is walking down the street     Patient can be reached at [phone]
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
## 2022-10-26 ENCOUNTER — Other Ambulatory Visit: Payer: Self-pay

## 2022-10-27 ENCOUNTER — Other Ambulatory Visit: Payer: Self-pay

## 2022-10-27 ENCOUNTER — Telehealth: Payer: Self-pay

## 2022-10-27 ENCOUNTER — Telehealth: Payer: Self-pay | Admitting: Internal Medicine

## 2022-10-27 NOTE — Telephone Encounter (Signed)
Spoke to patient, scheduled into next available.  Added to wait list.  Letter mailed to patient about appointment scheduled for 08/02/2023.

## 2022-10-27 NOTE — Telephone Encounter (Signed)
Jenna [name]  [id number]  [address]  [place] [address]     REFERRAL FROM:   [facility]: Social Work Follow Up    PRESENTING PROBLEM:                 Transportation & Insurance    ADDITIONAL INFORMATION:  Patient is requesting transportation for upcoming appointment. Per [employer] patient does not have active Medicaid at this time.    ACTION TAKEN:            Writer called patient and was able to speak with her. Writer informed patient that she still does not have active medicaid at this time, Clinical research associate asked patient if she had called the [employer] to re-enroll. Patient reports that she did call and she was put on a Blue Choice Option. Writer called [facility] to confirm that patient does not have active medicaid at this time, however patient's medications were covered by express scripts. Writer asked [facility] Partner Lajuan Lines PharmD, to look at patients insurance. Dyke Maes reports that it appears patient has a commercial plan at this time.   Writer called patient back and was able to speak with her again. Writer asked patient if she was placed on a commercial plan, patient reports that she may have been. Writer informed patient that this is not a free plan. Writer asked patient if she has income at this time, patient denies income at this time. Writer asked patient if she or any of her children receive SSI, patient reports that her children do however they are listed under their father. Writer asked patient if she would like Clinical research associate to refer patient to Financial Assistance Case Management, patient agrees. Writer informed patient that since she is on a commercial plan at this time we are not able to provide her transportation. Patient verbalized understanding and reports that she will find her own transportation to her appointment tomorrow.

## 2022-10-27 NOTE — Telephone Encounter (Signed)
Copied from CRM [id number]. Topic: Information Request - Transportation Information  >> Oct 27, 2022 12:21 PM Jenna [name] wrote:  Patient is requesting transportation to their upcoming appointment on: 10/28/22 at 3:40 located at the office [facility] with an address of: [address]    Is the patient experiencing any symptoms?   If yes, the symptoms are:     Pick up patient at (verified) home address? Yes   Confirmation of pick up address is: [address]    Special accommodations needed (walker, wheelchair, stretcher and/or type of vehicle)? Walker and will be riding with additional riders    Will they need any assistance from the driver from the door of the home to the vehicle (please specify if there are steps)?       Preferred transportation company?  [employer]      Patient contact number [phone]

## 2022-10-28 ENCOUNTER — Ambulatory Visit: Payer: Medicaid Other

## 2022-10-28 ENCOUNTER — Telehealth: Payer: Self-pay

## 2022-10-28 DIAGNOSIS — Z9189 Other specified personal risk factors, not elsewhere classified: Secondary | ICD-10-CM

## 2022-10-28 DIAGNOSIS — D500 Iron deficiency anemia secondary to blood loss (chronic): Secondary | ICD-10-CM

## 2022-10-28 DIAGNOSIS — Z5941 Food insecurity: Secondary | ICD-10-CM

## 2022-10-28 NOTE — Telephone Encounter (Signed)
Reporting cold sx, having shortness of breath sx and sore throat today. Has not yet picked up her inhalers, states she feels better in upright position. Advised patient to go to the ED if her sx are worse, she is declining at this time, saying she thinks it will get better but knows to call ED if sx worsen. Counseled patient to call our clinic to schedule an appointment for tomorrow for further follow up, she is agreeable to this.     Patient is close to being evicted from her apartment with her husband and 3 children, and requesting to have a Futures trader for help. Also requires renewal of her Medicaid. I ordered emergency food vouchers for her today to be picked up at [facility] at [facility], patient plans to pick them up tomorrow. States her children are all going hungry at present.     Discussed with patient the need for OB/Gyn referral and hematology referral for her menorrhagia and possible need for iron infusions. She is amenable to following up with them, will place referrals today. Counseled her to get blood work done tomorrow to assess her sx further.     Patient requesting new referral to [facility] in [facility] who she states will be able to see her in 10/24 - will place referral, she will need Medicaid transportation to get there.     Daylene Katayama, MD  PGY - 2

## 2022-10-29 ENCOUNTER — Encounter: Payer: Self-pay | Admitting: Internal Medicine

## 2022-11-02 ENCOUNTER — Encounter: Payer: Self-pay | Admitting: Internal Medicine

## 2022-11-02 ENCOUNTER — Telehealth: Payer: Self-pay | Admitting: Internal Medicine

## 2022-11-02 NOTE — Telephone Encounter (Signed)
Copied from CRM #[id number]. Topic: Information Request - Transportation Information  >> Nov 02, 2022 12:16 PM Luis Abed wrote:  Patient is requesting transportation to their upcoming appointment on: 11/04/22 at 3:40 located at the office [facility] with an address of: [address]    Is the patient experiencing any symptoms?   If yes, the symptoms are:     Pick up patient at (verified) home address? Yes   Confirmation of pick up address is: [address]    Special accommodations needed (walker, wheelchair, stretcher and/or type of vehicle)? No. Patient will have additional riders though      Will they need any assistance from the driver from the door of the home to the vehicle (please specify if there are steps)? No.      Preferred transportation company?  [employer]     Patient contact number [phone]

## 2022-11-03 ENCOUNTER — Other Ambulatory Visit
Admission: RE | Admit: 2022-11-03 | Discharge: 2022-11-03 | Disposition: A | Discharge status: Home / Self Care | Payer: Medicaid Other | Source: Ambulatory Visit | Attending: Internal Medicine | Admitting: Internal Medicine

## 2022-11-03 ENCOUNTER — Other Ambulatory Visit: Payer: Self-pay

## 2022-11-03 ENCOUNTER — Telehealth: Payer: Self-pay

## 2022-11-03 DIAGNOSIS — Z13220 Encounter for screening for lipoid disorders: Secondary | ICD-10-CM | POA: Insufficient documentation

## 2022-11-03 DIAGNOSIS — R109 Unspecified abdominal pain: Secondary | ICD-10-CM | POA: Insufficient documentation

## 2022-11-03 DIAGNOSIS — Z131 Encounter for screening for diabetes mellitus: Secondary | ICD-10-CM | POA: Insufficient documentation

## 2022-11-03 DIAGNOSIS — I10 Essential (primary) hypertension: Secondary | ICD-10-CM | POA: Insufficient documentation

## 2022-11-03 DIAGNOSIS — D500 Iron deficiency anemia secondary to blood loss (chronic): Secondary | ICD-10-CM | POA: Insufficient documentation

## 2022-11-03 DIAGNOSIS — N920 Excessive and frequent menstruation with regular cycle: Secondary | ICD-10-CM | POA: Insufficient documentation

## 2022-11-03 LAB — COMPREHENSIVE METABOLIC PANEL
ALT: 17 U/L (ref 0–35)
AST: 17 U/L (ref 0–35)
Albumin: 4.3 g/dL (ref 3.5–5.2)
Alk Phos: 113 U/L — ABNORMAL HIGH (ref 35–105)
Anion Gap: 12 (ref 7–16)
Bilirubin,Total: <0.2 mg/dL (ref 0.0–1.2)
CO2: 24 mmol/L (ref 20–28)
Calcium: 9.8 mg/dL (ref 8.8–10.2)
Chloride: 103 mmol/L (ref 96–108)
Creatinine: 0.8 mg/dL (ref 0.51–0.95)
Glucose: 124 mg/dL — ABNORMAL HIGH (ref 60–99)
Lab: 20 mg/dL (ref 6–20)
Potassium: 4.7 mmol/L (ref 3.3–5.1)
Sodium: 139 mmol/L (ref 133–145)
Total Protein: 7.5 g/dL (ref 6.3–7.7)
eGFR BY CREAT: 97 *

## 2022-11-03 LAB — CBC AND DIFFERENTIAL
Baso # K/uL: 0.1 10*3/uL (ref 0.0–0.2)
Eos # K/uL: 0.2 10*3/uL (ref 0.0–0.5)
Hematocrit: 34 % (ref 34–49)
Hemoglobin: 9.5 g/dL — ABNORMAL LOW (ref 11.2–16.0)
IMM Granulocytes #: 0.1 10*3/uL — ABNORMAL HIGH (ref 0.0–0.0)
IMM Granulocytes: 0.6 %
Lymph # K/uL: 2.1 10*3/uL (ref 1.0–5.0)
MCV: 81 fL (ref 75–100)
Mono # K/uL: 0.9 10*3/uL (ref 0.1–1.0)
Neut # K/uL: 9.3 10*3/uL — ABNORMAL HIGH (ref 1.5–6.5)
Nucl RBC # K/uL: 0 10*3/uL (ref 0.0–0.0)
Nucl RBC %: 0 /100 WBC (ref 0.0–0.2)
Platelets: 394 10*3/uL (ref 150–450)
RBC: 4.2 MIL/uL (ref 4.0–5.5)
RDW: 22.3 % — ABNORMAL HIGH (ref 0.0–15.0)
Seg Neut %: 74.2 %
WBC: 12.5 10*3/uL — ABNORMAL HIGH (ref 3.5–11.0)

## 2022-11-03 LAB — RBC MORPHOLOGY

## 2022-11-03 LAB — TIBC
Iron: 31 ug/dL — ABNORMAL LOW (ref 34–165)
TIBC: 483 ug/dL — ABNORMAL HIGH (ref 250–450)
Transferrin Saturation: 6 % — ABNORMAL LOW (ref 15–50)

## 2022-11-03 LAB — LIPID PANEL
Chol/HDL Ratio: 3.3
Cholesterol: 153 mg/dL
HDL: 47 mg/dL (ref 40–60)
LDL Calculated: 60 mg/dL
Non HDL Cholesterol: 106 mg/dL
Triglycerides: 229 mg/dL — AB

## 2022-11-03 LAB — FERRITIN: Ferritin: 65 ng/mL (ref 10–120)

## 2022-11-03 LAB — LIPASE: Lipase: 26 U/L (ref 13–60)

## 2022-11-03 LAB — TRANSFERRIN: Transferrin: 358 mg/dL (ref 200–360)

## 2022-11-03 LAB — MULTIPLE ORDERING DOCS

## 2022-11-03 LAB — AMYLASE: Amylase: 57 U/L (ref 28–100)

## 2022-11-03 NOTE — Telephone Encounter (Signed)
Jenna [name]  [id number]  [address]  [place] [address]     REFERRAL FROM:   [facility]: Social Work Follow Up    PRESENTING PROBLEM:                 Transportation    ADDITIONAL INFORMATION:  Patient requesting transportation for upcoming appointment. Social Work is not longer able to provide vouchered rides for patient as she has had multiple rides set up that she has not gotten into and Social Work is still charged.    ACTION TAKEN:            Writer called patient and was not able to speak with her. Writer spoke with patient's significant other, Jenna [name], who reports that patient is at the hospital with one of her children for an appointment. Writer asked Jenna [name] how patient got to [facility], he reports medicab. Writer informed Jenna [name] that Social Work will no longer be able to provide/voucher transportation for appointment. Writer informed Jenna [name] that patient has an appointment with [facility] tomorrow that she requested assistance with transportation for. Jenna [name] wrote down the day and time of appointment and thanked Clinical research associate for letting him know.

## 2022-11-04 ENCOUNTER — Other Ambulatory Visit: Payer: Self-pay

## 2022-11-04 ENCOUNTER — Ambulatory Visit: Payer: Medicaid Other

## 2022-11-04 ENCOUNTER — Telehealth: Payer: Self-pay

## 2022-11-04 LAB — HEMOGLOBIN A1C: Hemoglobin A1C: 5.3 %

## 2022-11-04 NOTE — Telephone Encounter (Signed)
Patient unable to afford medication co-pays.  Patient requesting voucher so that medications can be released  Verbal called into the [facility] authorizing release of needed medication  Patient aware of and in agreement with plan   -------------------    [facility] SOCIAL WORK  PHARMACY FORM     Today's date:  November 04, 2022    Patient Name: Jenna [name]      Medical Record #:  [id number]       DOB:  05-14-1985      Patient's Address:   [address]                Social Worker: Genevieve Norlander, [place]     Office  [phone]     Date of Service: November 04, 2022       Funding Source: [facility]ication Assistance Fund  ___________________________________________________________________    Pharmacy Information:  Date/time sent: November 04, 2022     Time needed: 11/04/22    Patient Location: [facility]   Medication Pick-up Preference: Patient will pick up at the pharmacy    Pharmacy Contact: [name]  Social work signature: Genevieve Norlander, LMSW  X [phone]  Supervisor/Manager Approval (if indicated):  Date:  (supervisor signature not required for [facility] pending)    Pharmacy Fax # [phone]  Tube [phone]  Print form and fax or tube to pharmacy.  If it requires Supervisor approval copy and email to supervisor

## 2022-11-08 NOTE — Telephone Encounter (Signed)
Not picked up/delivered

## 2022-11-12 ENCOUNTER — Telehealth: Payer: Self-pay

## 2022-11-12 NOTE — Telephone Encounter (Signed)
Attempted to call pt to schedule follow up. When phone was picked up I asked if it was Jenna [name] and the person on the phone yelled NO and then hung up a few seconds later.

## 2022-11-15 ENCOUNTER — Other Ambulatory Visit: Payer: Self-pay

## 2022-11-16 NOTE — Progress Notes (Signed)
SOCIAL WORK PROGRESS     Notified that patient needs transportation to her appointment on 9/25. Called patient to discuss. She reported that she needs transportation to her appointment. Attempted to schedule transportation on MAS but her Medicaid is not active. Checked FCM summary and FCM has attempted to make contact with patient. Will reach out to them to see next steps. Scheduled cab ride with patient on the phone. She is aware of trip details. Will continue to follow.     Contacts:  [place]: [phone]    Type: Cab, voucher      Home Address: [employer] - [address] [place], [place] [address]    Date: Pick up Time: Drop off Location: Cab Company:   12/08/22 12:00 pm  [address] [place], [place]  [facility] Social Worker  Office Phone: [phone]     [facility] SOCIAL WORK:     Patient referred based on distress screen?: No      Patient referred due to G8?: No    Social Work contact made?: Yes      Method of contact:  Telephone    Social Work Assessment:  SDOH and Financial    Barriers to care:  Insurance claims handler Needs    Social Work post assessment plan:  Referral and As needed follow-up  Referral:     Transportation:  [employer] Voucher                     [place]    Time spent on this encounter (in minutes):  20

## 2022-12-02 ENCOUNTER — Telehealth: Payer: Self-pay

## 2022-12-02 NOTE — Telephone Encounter (Signed)
Jenna [name] has complaints of irregular vaginal bleeding which they describe as heavy. This bleeding  started  3 weeks ago.     Patient reports soaking a full sized menstrual pad 1 times every 2 hour.    LMP unknown d/t irreg period and irreg BV.    Patient is sexually active.    Current method of contraception:  .       Patient reports suprapubic pain which they describe as constant  and rates their pain at a level of Pain Score: 6/10. This pain started  21  days ago.    Patient denies dizziness or lightheadedness.  Pt reports HA and excessive fatigue.    Patient directed to urgent care /ED for excessive bleeding.    Pt is not a pt of our office. Pt missed her NPV in April and did not rescedule. Explained to pt technically she is not under our care and the most appropriate place for her to go is the ED. Explained to pt ED is most appropriate plan of action right now and she can call to schedule a NPV is she wishes to obtain OBGYN care here in the future. Pt was agreeable with plan and will report to ED. Pt has transportation to get to the ED today.    Patient Demonstrated good understanding of concepts and was agreeable with plan.

## 2022-12-02 NOTE — Telephone Encounter (Signed)
Copied from CRM [id number]. Topic: Access to Care - Speak to Provider/Office Staff  >> Dec 02, 2022  1:12 PM Sue Lush wrote:  Morrie Sheldon Patient calling to report that the patient is experiencing vaginal bleeding, every 2 hours on and off; difficulty staying awake and migraine headache (feels like my brain in going to explode) has been one week    Patient was dismissed from the practice 8/22 Unable to schedule primary care visit with the office.      Patient states she will  see if she can report to urgent care center     Declined to speak with [employer] health states she already talked to them     Patient requesting to appeal her dismissal from the practice.  Writer advised might take a few days for return call    Phone number confirmed at [phone]

## 2022-12-03 NOTE — Telephone Encounter (Signed)
Reviewed - appeal not approved.
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
## 2022-12-08 ENCOUNTER — Telehealth: Payer: Self-pay

## 2022-12-08 ENCOUNTER — Ambulatory Visit: Payer: Medicaid Other

## 2022-12-08 ENCOUNTER — Telehealth: Payer: MEDICAID | Admitting: Physician Assistant

## 2022-12-08 DIAGNOSIS — B9689 Other specified bacterial agents as the cause of diseases classified elsewhere: Secondary | ICD-10-CM | POA: Diagnosis not present

## 2022-12-08 DIAGNOSIS — J208 Acute bronchitis due to other specified organisms: Secondary | ICD-10-CM

## 2022-12-08 MED ORDER — BENZONATATE 100 MG PO CAPS
100.0000 mg | ORAL_CAPSULE | Freq: Three times a day (TID) | ORAL | 0 refills | Status: AC | PRN
Start: 2022-12-08 — End: ?

## 2022-12-08 MED ORDER — ALBUTEROL SULFATE HFA 108 (90 BASE) MCG/ACT IN AERS
2.0000 | INHALATION_SPRAY | Freq: Four times a day (QID) | RESPIRATORY_TRACT | 0 refills | Status: AC | PRN
Start: 2022-12-08 — End: ?

## 2022-12-08 MED ORDER — AZITHROMYCIN 250 MG PO TABS
ORAL_TABLET | ORAL | 0 refills | Status: AC
Start: 2022-12-08 — End: 2022-12-13

## 2022-12-08 NOTE — Patient Instructions (Signed)
[name], thank you for joining Piedad Climes, PA-C for today's virtual visit.  While this provider is not your primary care provider (PCP), if your PCP is located in our provider database this encounter information will be shared with them immediately following your visit.   A [facility] MyChart account gives you access to today's visit and all your visits, tests, and labs performed at [facility] " click here if you don't have a [facility] MyChart account or go to [email or web address]  Consent: (Patient) Samantha Nguyen provided verbal consent for this virtual visit at the beginning of the encounter.  Current Medications:  Current Outpatient Medications:    rosuvastatin (CRESTOR) 10 MG tablet, Take 1 tablet (10 mg total) by mouth daily., Disp: 90 tablet, Rfl: 3   ALPRAZolam (XANAX) 0.5 MG tablet, Take 1 tablet (0.5 mg total) by mouth 2 (two) times daily as needed for sleep or anxiety., Disp: 10 tablet, Rfl: 0   doxycycline (VIBRA-TABS) 100 MG tablet, Take 1 tablet (100 mg total) by mouth 2 (two) times daily., Disp: 20 tablet, Rfl: 0   pantoprazole (PROTONIX) 40 MG tablet, Take 1 tablet (40 mg total) by mouth daily., Disp: 30 tablet, Rfl: 1   PARoxetine (PAXIL) 20 MG tablet, Take 1 tablet (20 mg total) by mouth daily., Disp: 30 tablet, Rfl: 0   Medications ordered in this encounter:  No orders of the defined types were placed in this encounter.    *If you need refills on other medications prior to your next appointment, please contact your pharmacy*  Follow-Up: Call back or seek an in-person evaluation if the symptoms worsen or if the condition fails to improve as anticipated.  [facility] [phone]  Other Instructions Take antibiotic (Azithromycin) as directed.  Increase fluids.  Get plenty of rest. Use Mucinex for congestion. Take Tessalon and Albuterol . Take a daily probiotic (I recommend Align or Culturelle, but even Activia Yogurt may  be beneficial).  A humidifier placed in the bedroom may offer some relief for a dry, scratchy throat of nasal irritation.  Read information below on acute bronchitis. Please call or return to clinic if symptoms are not improving.  Acute Bronchitis Bronchitis is when the airways that extend from the windpipe into the lungs get red, puffy, and painful (inflamed). Bronchitis often causes thick spit (mucus) to develop. This leads to a cough. A cough is the most common symptom of bronchitis. In acute bronchitis, the condition usually begins suddenly and goes away over time (usually in 2 weeks). Smoking, allergies, and asthma can make bronchitis worse. Repeated episodes of bronchitis may cause more lung problems.  HOME CARE Rest. Drink enough fluids to keep your pee (urine) clear or pale yellow (unless you need to limit fluids as told by your doctor). Only take over-the-counter or prescription medicines as told by your doctor. Avoid smoking and secondhand smoke. These can make bronchitis worse. If you are a smoker, think about using nicotine gum or skin patches. Quitting smoking will help your lungs heal faster. Reduce the chance of getting bronchitis again by: Washing your hands often. Avoiding people with cold symptoms. Trying not to touch your hands to your mouth, nose, or eyes. Follow up with your doctor as told.  GET HELP IF: Your symptoms do not improve after 1 week of treatment. Symptoms include: Cough. Fever. Coughing up thick spit. Body aches. Chest congestion. Chills. Shortness of breath. Sore throat.  GET HELP RIGHT AWAY IF:  You have an  increased fever. You have chills. You have severe shortness of breath. You have bloody thick spit (sputum). You throw up (vomit) often. You lose too much body fluid (dehydration). You have a severe headache. You faint.  MAKE SURE YOU:  Understand these instructions. Will watch your condition. Will get help right away if you are not doing  well or get worse. Document Released: 08/18/2007 Document Revised: 11/01/2012 Document Reviewed: 08/22/2012 [facility] Patient Information 2015 [place], [place]. This information is not intended to replace advice given to you by your health care provider. Make sure you discuss any questions you have with your health care provider.    If you have been instructed to have an in-person evaluation today at a local Urgent Care facility, please use the link below. It will take you to a list of all of our available [facility], including address, phone number and hours of operation. Please do not delay care.  [facility]  If you or a family member do not have a primary care provider, use the link below to schedule a visit and establish care. When you choose a [facility] primary care physician or advanced practice provider, you gain a long-term partner in health. Find a Primary Care Provider  Learn more about 's in-office and virtual care options:  - Get Care Now

## 2022-12-08 NOTE — Progress Notes (Signed)
Virtual Visit Consent   Samantha Nguyen, you are scheduled for a virtual visit with a [facility] provider today. Just as with appointments in the office, your consent must be obtained to participate. Your consent will be active for this visit and any virtual visit you may have with one of our providers in the next 365 days. If you have a MyChart account, a copy of this consent can be sent to you electronically.  As this is a virtual visit, video technology does not allow for your provider to perform a traditional examination. This may limit your provider's ability to fully assess your condition. If your provider identifies any concerns that need to be evaluated in person or the need to arrange testing (such as labs, EKG, etc.), we will make arrangements to do so. Although advances in technology are sophisticated, we cannot ensure that it will always work on either your end or our end. If the connection with a video visit is poor, the visit may have to be switched to a telephone visit. With either a video or telephone visit, we are not always able to ensure that we have a secure connection.  By engaging in this virtual visit, you consent to the provision of healthcare and authorize for your insurance to be billed (if applicable) for the services provided during this visit. Depending on your insurance coverage, you may receive a charge related to this service.  I need to obtain your verbal consent now. Are you willing to proceed with your visit today? Samantha Nguyen has provided verbal consent on 12/08/2022 for a virtual visit (video or telephone). Piedad Climes, [place]  Date: 12/08/2022 8:43 AM  Virtual Visit via Video Note   I, Piedad Climes, connected with  Samantha Nguyen  ([id number], 06-21-1985) on 12/08/22 at  8:45 AM EDT by a video-enabled telemedicine application and verified that I am speaking with the correct person using two identifiers.  Location: Patient: Virtual Visit Location Patient:  Home Provider: Virtual Visit Location Provider: Home Office   I discussed the limitations of evaluation and management by telemedicine and the availability of in person appointments. The patient expressed understanding and agreed to proceed.    History of Present Illness: Samantha Nguyen is a 37 y.o. who identifies as a female who was assigned female at birth, and is being seen today for URI symptoms starting last week. Was a milder, infrequent cough but as of Sunday has been much more substantial and persistent. Is now productive of thick phlegm. Now with a low-grade fever. Some mild nasal congestion. Denies chest pain or SOB. Denies recent travel. Works as a Runner, broadcasting/film/video.  Has tested for COVID x 4 which were all negative.  OTC -- Ibuprofen, Nyquil.    HPI: HPI  Problems:  Patient Active Problem List   Diagnosis Date Noted   Other chest pain 09/02/2022   Gastroesophageal reflux disease without esophagitis 09/02/2022   Mixed hyperlipidemia 09/02/2022   Costochondritis 09/02/2022   Elevated blood pressure reading 07/27/2022   Tension-type headache, not intractable 07/27/2022   MDD (major depressive disorder), recurrent episode, moderate (HCC) 05/02/2022   Generalized anxiety disorder 05/02/2022   Illness anxiety disorder 04/28/2022   MDD (major depressive disorder), single episode, severe (HCC) 04/28/2022   Sepsis due to urinary tract infection (HCC) 08/24/2016    Allergies:  Allergies  Allergen Reactions   Sulfa Antibiotics Other (See Comments)    Unknown, happened when she was a child   Medications:  Current Outpatient Medications:  albuterol (VENTOLIN HFA) 108 (90 Base) MCG/ACT inhaler, Inhale 2 puffs into the lungs every 6 (six) hours as needed for wheezing or shortness of breath., Disp: 8 g, Rfl: 0   azithromycin (ZITHROMAX) 250 MG tablet, Take 2 tablets on day 1, then 1 tablet daily on days 2 through 5, Disp: 6 tablet, Rfl: 0   benzonatate (TESSALON) 100 MG capsule, Take 1 capsule  (100 mg total) by mouth 3 (three) times daily as needed for cough., Disp: 30 capsule, Rfl: 0   ALPRAZolam (XANAX) 0.5 MG tablet, Take 1 tablet (0.5 mg total) by mouth 2 (two) times daily as needed for sleep or anxiety., Disp: 10 tablet, Rfl: 0   PARoxetine (PAXIL) 20 MG tablet, Take 1 tablet (20 mg total) by mouth daily., Disp: 30 tablet, Rfl: 0  Observations/Objective: Patient is well-developed, well-nourished in no acute distress.  Resting comfortably at home.  Head is normocephalic, atraumatic.  No labored breathing. Speech is clear and coherent with logical content.  Patient is alert and oriented at baseline.   Assessment and Plan: 1. Acute bacterial bronchitis - benzonatate (TESSALON) 100 MG capsule; Take 1 capsule (100 mg total) by mouth 3 (three) times daily as needed for cough.  Dispense: 30 capsule; Refill: 0 - azithromycin (ZITHROMAX) 250 MG tablet; Take 2 tablets on day 1, then 1 tablet daily on days 2 through 5  Dispense: 6 tablet; Refill: 0 - albuterol (VENTOLIN HFA) 108 (90 Base) MCG/ACT inhaler; Inhale 2 puffs into the lungs every 6 (six) hours as needed for wheezing or shortness of breath.  Dispense: 8 g; Refill: 0  Rx Azithromycin.  Increase fluids.  Rest.  Saline nasal spray.  Probiotic.  Mucinex as directed.  Humidifier in bedroom. Tessalon and albuterol per orders.  Call or return to clinic if symptoms are not improving.   Follow Up Instructions: I discussed the assessment and treatment plan with the patient. The patient was provided an opportunity to ask questions and all were answered. The patient agreed with the plan and demonstrated an understanding of the instructions.  A copy of instructions were sent to the patient via MyChart unless otherwise noted below.    The patient was advised to call back or seek an in-person evaluation if the symptoms worsen or if the condition fails to improve as anticipated.  Time:  I spent 10 minutes with the patient via telehealth  technology discussing the above problems/concerns.    Piedad Climes, PA-C

## 2022-12-08 NOTE — Telephone Encounter (Signed)
Referral generated and routed to [facility] for outreach.

## 2022-12-08 NOTE — Telephone Encounter (Signed)
Copied from CRM [id number]. Topic: Access to Care - Refer to Specialty or Service  >> Dec 08, 2022 10:10 AM Archie Endo wrote:  [facility] - Telephone Request for Services    Referral Source:      Who is the caller? Patient  Contact information if different from the patient: n/a    Patient Information:    Patient's Name:  Jenna [name]   Patient's Date of Birth: Nov 28, 1985   Pronouns:    she/her/hers   Preferred Phone:  [phone]   Address: [facility]   [address]  [address]  [place] [address]     Demographics Verified/Updated?Yes    Can messages regarding your mental health services be left at this number?Yes    Preferred Language:   English    Was an interpreter used for this call?   No    Insurance Information:  Insurance to be billed for services:   [employer]  Policy #: [id number]    Will care be covered by elective self-pay, WC, MVA, VA Benefits, or any other 3rd party coverage?  NO    Presenting Concern:    What is the reason you are seeking care?     (Provide a brief description of the reasons the patient/referring provider is seeking treatment. Use patients own words where possible.)    Per pt" Multiple hygiene arrest," Bipolar, manic, PTSD    What service(s) are you hoping to be connected to?    Individual Psychotherapy    Do you currently feel safe from harming yourself or others?     Yes

## 2022-12-09 ENCOUNTER — Telehealth: Payer: MEDICAID | Admitting: Physician Assistant

## 2022-12-09 DIAGNOSIS — F411 Generalized anxiety disorder: Secondary | ICD-10-CM | POA: Diagnosis not present

## 2022-12-09 DIAGNOSIS — R195 Other fecal abnormalities: Secondary | ICD-10-CM

## 2022-12-09 LAB — EKG 12-LEAD
P: 53 deg
PR: 155 ms
QRS: 17 deg
QRSD: 95 ms
QT: 381 ms
QTc: 441 ms
Rate: 80 {beats}/min
T: -6 deg

## 2022-12-09 NOTE — Progress Notes (Signed)
Virtual Visit Consent   [name], you are scheduled for a virtual visit with a [facility] provider today. Just as with appointments in the office, your consent must be obtained to participate. Your consent will be active for this visit and any virtual visit you may have with one of our providers in the next 365 days. If you have a MyChart account, a copy of this consent can be sent to you electronically.  As this is a virtual visit, video technology does not allow for your provider to perform a traditional examination. This may limit your provider's ability to fully assess your condition. If your provider identifies any concerns that need to be evaluated in person or the need to arrange testing (such as labs, EKG, etc.), we will make arrangements to do so. Although advances in technology are sophisticated, we cannot ensure that it will always work on either your end or our end. If the connection with a video visit is poor, the visit may have to be switched to a telephone visit. With either a video or telephone visit, we are not always able to ensure that we have a secure connection.  By engaging in this virtual visit, you consent to the provision of healthcare and authorize for your insurance to be billed (if applicable) for the services provided during this visit. Depending on your insurance coverage, you may receive a charge related to this service.  I need to obtain your verbal consent now. Are you willing to proceed with your visit today? [name] has provided verbal consent on 12/09/2022 for a virtual visit (video or telephone). Samantha Nguyen, [place]  Date: 12/09/2022 11:34 AM  Virtual Visit via Video Note   I, Samantha Nguyen, connected with  Samantha Nguyen  ([id number], 1986-01-15) on 12/09/22 at 11:30 AM EDT by a video-enabled telemedicine application and verified that I am speaking with the correct person using two identifiers.  Location: Patient: Virtual Visit Location Patient:  Home Provider: Virtual Visit Location Provider: Home Office   I discussed the limitations of evaluation and management by telemedicine and the availability of in person appointments. The patient expressed understanding and agreed to proceed.    History of Present Illness: Samantha Nguyen is a 37 y.o. who identifies as a female who was assigned female at birth, and is being seen today for concerns over green discolored stool this morning after starting Azithromycin for bronchitis yesterday. Notes resolution of fever. Denies any other bowel changes. Did not eat much yesterday but drank blue gatorade most of the day.  HPI: HPI  Problems:  Patient Active Problem List   Diagnosis Date Noted   Other chest pain 09/02/2022   Gastroesophageal reflux disease without esophagitis 09/02/2022   Mixed hyperlipidemia 09/02/2022   Costochondritis 09/02/2022   Elevated blood pressure reading 07/27/2022   Tension-type headache, not intractable 07/27/2022   MDD (major depressive disorder), recurrent episode, moderate (HCC) 05/02/2022   Generalized anxiety disorder 05/02/2022   Illness anxiety disorder 04/28/2022   MDD (major depressive disorder), single episode, severe (HCC) 04/28/2022   Sepsis due to urinary tract infection (HCC) 08/24/2016    Allergies:  Allergies  Allergen Reactions   Sulfa Antibiotics Other (See Comments)    Unknown, happened when she was a child   Medications:  Current Outpatient Medications:    albuterol (VENTOLIN HFA) 108 (90 Base) MCG/ACT inhaler, Inhale 2 puffs into the lungs every 6 (six) hours as needed for wheezing or shortness of breath., Disp: 8 g, Rfl:  0   ALPRAZolam (XANAX) 0.5 MG tablet, Take 1 tablet (0.5 mg total) by mouth 2 (two) times daily as needed for sleep or anxiety., Disp: 10 tablet, Rfl: 0   azithromycin (ZITHROMAX) 250 MG tablet, Take 2 tablets on day 1, then 1 tablet daily on days 2 through 5, Disp: 6 tablet, Rfl: 0   benzonatate (TESSALON) 100 MG capsule,  Take 1 capsule (100 mg total) by mouth 3 (three) times daily as needed for cough., Disp: 30 capsule, Rfl: 0   PARoxetine (PAXIL) 20 MG tablet, Take 1 tablet (20 mg total) by mouth daily., Disp: 30 tablet, Rfl: 0  Observations/Objective: Patient is well-developed, visibly anxious and tearful Resting comfortably at home.  Head is normocephalic, atraumatic.  No labored breathing. Speech is clear and coherent with logical content.  Patient is alert and oriented at baseline.   Assessment and Plan: 1. Anxiety state  2. Change in stool  Reassurance given to patient. This is not inidicator of worsening illness, medication side effect, etc., especially in absence of other GI symptoms. Discussed blue dye from all the gatorade she had yesterday is the likely culprit. Encourage her to continue to hydrate but avoid gatorade. Continue treatment recommended yesterday and monitor her stools for return to normal color. Giving her severe levels of anxiety at today ad yesterday's visit and numerous ER visits for anxiety state in past few weeks, recommend she reach back out to her behavioral specialists for further discussion and treatment adjustments.   Follow Up Instructions: I discussed the assessment and treatment plan with the patient. The patient was provided an opportunity to ask questions and all were answered. The patient agreed with the plan and demonstrated an understanding of the instructions.  A copy of instructions were sent to the patient via MyChart unless otherwise noted below.   The patient was advised to call back or seek an in-person evaluation if the symptoms worsen or if the condition fails to improve as anticipated.  Time:  I spent 10 minutes with the patient via telehealth technology discussing the above problems/concerns.    Samantha Climes, PA-C

## 2022-12-09 NOTE — Patient Instructions (Signed)
  [name], thank you for joining Piedad Climes, PA-C for today's virtual visit.  While this provider is not your primary care provider (PCP), if your PCP is located in our provider database this encounter information will be shared with them immediately following your visit.   A [facility] MyChart account gives you access to today's visit and all your visits, tests, and labs performed at [facility] " click here if you don't have a [facility] MyChart account or go to [email or web address]  Consent: (Patient) Samantha Nguyen provided verbal consent for this virtual visit at the beginning of the encounter.  Current Medications:  Current Outpatient Medications:    albuterol (VENTOLIN HFA) 108 (90 Base) MCG/ACT inhaler, Inhale 2 puffs into the lungs every 6 (six) hours as needed for wheezing or shortness of breath., Disp: 8 g, Rfl: 0   ALPRAZolam (XANAX) 0.5 MG tablet, Take 1 tablet (0.5 mg total) by mouth 2 (two) times daily as needed for sleep or anxiety., Disp: 10 tablet, Rfl: 0   azithromycin (ZITHROMAX) 250 MG tablet, Take 2 tablets on day 1, then 1 tablet daily on days 2 through 5, Disp: 6 tablet, Rfl: 0   benzonatate (TESSALON) 100 MG capsule, Take 1 capsule (100 mg total) by mouth 3 (three) times daily as needed for cough., Disp: 30 capsule, Rfl: 0   PARoxetine (PAXIL) 20 MG tablet, Take 1 tablet (20 mg total) by mouth daily., Disp: 30 tablet, Rfl: 0   Medications ordered in this encounter:  No orders of the defined types were placed in this encounter.    *If you need refills on other medications prior to your next appointment, please contact your pharmacy*  Follow-Up: Call back or seek an in-person evaluation if the symptoms worsen or if the condition fails to improve as anticipated.  [facility] [phone]  Other Instructions No more blue gatorade!!  I would stick with water, ginger ale or sprite for now. Monitor stools as they should  return to a more normal color for you. Continue care discussed at yesterday's visit. Feel better soon!   If you have been instructed to have an in-person evaluation today at a local Urgent Care facility, please use the link below. It will take you to a list of all of our available [facility], including address, phone number and hours of operation. Please do not delay care.  [facility]  If you or a family member do not have a primary care provider, use the link below to schedule a visit and establish care. When you choose a [facility] primary care physician or advanced practice provider, you gain a long-term partner in health. Find a Primary Care Provider  Learn more about [facility] in-office and virtual care options:  - Get Care Now

## 2022-12-10 ENCOUNTER — Telehealth: Payer: Self-pay

## 2022-12-10 NOTE — Telephone Encounter (Signed)
Patient: Jenna [name], Jenna [name] [[id number]] Referral #: [id number]   Status: New Request Type: Psychiatric   Class: Incoming Reason(s):     Diagnosis: F31.9 (ICD-10-CM) - Bipolar disorder  F43.10 (ICD-10-CM) - PTSD (post-traumatic stress disorder) Procedure: [id number] - AMB REFERRAL TO PSYCHIATRY   Start:   Expiration:     Requested: 1 Authorized: 1   Scheduled:   Completed:     Referring Location:   Referred to Location:     Referring Department: [facility] Referred To Department: [facility] ADULT       Referred to Dept Phone Number: [phone]   1st T/C to patient at [phone] in regard to obtaining additional information regarding patients needs.    Outcome:  Pt declined services. Patient has connected with outside provider. Referral closed.

## 2022-12-13 ENCOUNTER — Encounter: Payer: Self-pay | Admitting: Family

## 2022-12-13 ENCOUNTER — Emergency Department (HOSPITAL_COMMUNITY)
Admission: EM | Admit: 2022-12-13 | Discharge: 2022-12-13 | Disposition: A | Discharge status: Home / Self Care | Payer: MEDICAID | Attending: Emergency Medicine | Admitting: Emergency Medicine

## 2022-12-13 ENCOUNTER — Other Ambulatory Visit: Payer: Self-pay

## 2022-12-13 ENCOUNTER — Emergency Department (HOSPITAL_COMMUNITY): Payer: MEDICAID

## 2022-12-13 DIAGNOSIS — Z20822 Contact with and (suspected) exposure to covid-19: Secondary | ICD-10-CM | POA: Insufficient documentation

## 2022-12-13 DIAGNOSIS — R000 Tachycardia, unspecified: Secondary | ICD-10-CM | POA: Insufficient documentation

## 2022-12-13 DIAGNOSIS — R042 Hemoptysis: Secondary | ICD-10-CM | POA: Insufficient documentation

## 2022-12-13 DIAGNOSIS — F419 Anxiety disorder, unspecified: Secondary | ICD-10-CM | POA: Diagnosis present

## 2022-12-13 LAB — CBC WITH DIFFERENTIAL/PLATELET
Abs Immature Granulocytes: 0.02 10*3/uL (ref 0.00–0.07)
Basophils Absolute: 0 10*3/uL (ref 0.0–0.1)
Basophils Relative: 0 %
Eosinophils Absolute: 0.1 10*3/uL (ref 0.0–0.5)
Eosinophils Relative: 1 %
HCT: 42.2 % (ref 36.0–46.0)
Hemoglobin: 14.3 g/dL (ref 12.0–15.0)
Immature Granulocytes: 0 %
Lymphocytes Relative: 31 %
Lymphs Abs: 2.3 10*3/uL (ref 0.7–4.0)
MCH: 29.2 pg (ref 26.0–34.0)
MCHC: 33.9 g/dL (ref 30.0–36.0)
MCV: 86.3 fL (ref 80.0–100.0)
Monocytes Absolute: 0.4 10*3/uL (ref 0.1–1.0)
Monocytes Relative: 5 %
Neutro Abs: 4.8 10*3/uL (ref 1.7–7.7)
Neutrophils Relative %: 63 %
Platelets: 474 10*3/uL — ABNORMAL HIGH (ref 150–400)
RBC: 4.89 MIL/uL (ref 3.87–5.11)
RDW: 11.8 % (ref 11.5–15.5)
WBC: 7.6 10*3/uL (ref 4.0–10.5)
nRBC: 0 % (ref 0.0–0.2)

## 2022-12-13 LAB — BASIC METABOLIC PANEL
Anion gap: 16 — ABNORMAL HIGH (ref 5–15)
BUN: 10 mg/dL (ref 6–20)
CO2: 19 mmol/L — ABNORMAL LOW (ref 22–32)
Calcium: 9.7 mg/dL (ref 8.9–10.3)
Chloride: 102 mmol/L (ref 98–111)
Creatinine, Ser: 1.03 mg/dL — ABNORMAL HIGH (ref 0.44–1.00)
GFR, Estimated: >60 mL/min (ref >60)
Glucose, Bld: 96 mg/dL (ref 70–99)
Potassium: 3.5 mmol/L (ref 3.5–5.1)
Sodium: 137 mmol/L (ref 135–145)

## 2022-12-13 LAB — SARS CORONAVIRUS 2 BY RT PCR: SARS Coronavirus 2 by RT PCR: NEGATIVE

## 2022-12-13 MED ORDER — LORAZEPAM 1 MG PO TABS
1.0000 mg | ORAL_TABLET | Freq: Once | ORAL | Status: AC
  Administered 2022-12-13: 1 mg via ORAL
  Filled 2022-12-13: qty 1

## 2022-12-13 NOTE — ED Provider Triage Note (Signed)
Emergency Medicine Provider Triage Evaluation Note  Samantha Nguyen , a 37 y.o. female  was evaluated in triage.  Pt complains of hemoptysis.  Has been having a cough for several days.  Was diagnosed with bronchitis on a televisit and prescribed azithromycin which she just finished.  This morning was getting ready for work and then started coughing up blood 3 times.  She does not feel short of breath or have chest pain but she feels very anxious and she is worried she is going to die.  She took a 0.5 mg Xanax at about 530 but it has not helped.  Review of Systems  Positive: Anxiety, hemoptysis, cough Negative: Chest pain, shortness of breath  Physical Exam  BP (!) 139/93 (BP Location: Right Arm)   Pulse (!) 118   Temp 98.7 F (37.1 C) (Oral)   Resp (!) 23   Ht 5\' 7"  (1.702 m)   SpO2 100%   BMI 26.31 kg/m  Gen:   Awake, no distress, very anxious. Resp:  Normal effort, no significant wheezing Cardiac:  Tachycardia  Medical Decision Making  Medically screening exam initiated at 10:02 AM.  Appropriate orders placed.  Ryleah Miramontes was informed that the remainder of the evaluation will be completed by another provider, this initial triage assessment does not replace that evaluation, and the importance of remaining in the ED until their evaluation is complete.  Will give a dose of p.o. Ativan.  Will get EKG and get chest x-ray.  Besides the anxiety she is well-appearing.   Pricilla Loveless, MD 12/13/22 1003

## 2022-12-13 NOTE — ED Provider Notes (Signed)
[place] EMERGENCY DEPARTMENT AT [facility] Provider Note   CSN: [id number] Arrival date & time: 12/13/22  7829     History  Chief Complaint  Patient presents with   Anxiety   blood in sputum    Samantha Nguyen is a 37 y.o. female with medical history of anxiety, depression, migraines, renal disorder, anxiety disorder.  Patient presents to ED for evaluation of blood in sputum.  States that she has been having a cough and low-grade fever at home for the last week and was diagnosed with bronchitis by PCP.  Was placed on azithromycin which she has taken to completion.  States that she has had fevers over the last week but after beginning antibiotics has had no fevers.  States that this morning she woke up and had a coughing spell of which she noticed some pink blood in her sputum.  She became very concerned about this, very anxious and was brought in for evaluation.  On examination, patient is very tearful and crying.  She states that she has "death anxiety" and is concerned that she is dying.  She is requesting a lab test for cancer.  I advised her that blood in sputum is very common symptom of bronchitis.  She denies any shortness of breath, chest pain, nausea, vomiting, fevers, lightheadedness, dizziness, weakness.  Denies any abdominal pain.  States that she took 1 Xanax prior to arrival which did not alleviate her anxiety.   Anxiety Pertinent negatives include no chest pain and no shortness of breath.       Home Medications Prior to Admission medications   Medication Sig Start Date End Date Taking? Authorizing Provider  albuterol (VENTOLIN HFA) 108 (90 Base) MCG/ACT inhaler Inhale 2 puffs into the lungs every 6 (six) hours as needed for wheezing or shortness of breath. 12/08/22   Waldon Merl, PA-C  ALPRAZolam Prudy Feeler) 0.5 MG tablet Take 1 tablet (0.5 mg total) by mouth 2 (two) times daily as needed for sleep or anxiety. 07/27/22 07/27/23  Orson Eva, NP  azithromycin  (ZITHROMAX) 250 MG tablet Take 2 tablets on day 1, then 1 tablet daily on days 2 through 5 12/08/22 12/13/22  Waldon Merl, PA-C  benzonatate (TESSALON) 100 MG capsule Take 1 capsule (100 mg total) by mouth 3 (three) times daily as needed for cough. 12/08/22   Waldon Merl, PA-C  PARoxetine (PAXIL) 20 MG tablet Take 1 tablet (20 mg total) by mouth daily. 05/18/22   Bobbye Morton, MD      Allergies    Sulfa antibiotics    Review of Systems   Review of Systems  Respiratory:  Negative for shortness of breath.   Cardiovascular:  Negative for chest pain.  Neurological:  Negative for dizziness, weakness and light-headedness.  All other systems reviewed and are negative.   Physical Exam Updated Vital Signs BP (!) 139/93 (BP Location: Right Arm)   Pulse (!) 118   Temp 98.7 F (37.1 C) (Oral)   Resp (!) 23   Ht 5\' 7"  (1.702 m)   SpO2 100%   BMI 26.31 kg/m  Physical Exam Vitals and nursing note reviewed.  Constitutional:      General: She is not in acute distress.    Appearance: Normal appearance. She is not ill-appearing, toxic-appearing or diaphoretic.  HENT:     Head: Normocephalic and atraumatic.     Nose: Nose normal.     Mouth/Throat:     Mouth: Mucous membranes are moist.  Pharynx: Oropharynx is clear. No oropharyngeal exudate or posterior oropharyngeal erythema.  Eyes:     Extraocular Movements: Extraocular movements intact.     Conjunctiva/sclera: Conjunctivae normal.     Pupils: Pupils are equal, round, and reactive to light.  Cardiovascular:     Rate and Rhythm: Regular rhythm. Tachycardia present.  Pulmonary:     Effort: Pulmonary effort is normal.     Breath sounds: Normal breath sounds. No wheezing.  Abdominal:     General: Abdomen is flat. Bowel sounds are normal.     Palpations: Abdomen is soft.     Tenderness: There is no abdominal tenderness.  Musculoskeletal:     Cervical back: Normal range of motion and neck supple. No tenderness.  Skin:     General: Skin is warm and dry.     Capillary Refill: Capillary refill takes less than 2 seconds.  Neurological:     Mental Status: She is alert and oriented to person, place, and time.     ED Results / Procedures / Treatments   Labs (all labs ordered are listed, but only abnormal results are displayed) Labs Reviewed  BASIC METABOLIC PANEL - Abnormal; Notable for the following components:      Result Value   CO2 19 (*)    Creatinine, Ser 1.03 (*)    Anion gap 16 (*)    All other components within normal limits  CBC WITH DIFFERENTIAL/PLATELET - Abnormal; Notable for the following components:   Platelets 474 (*)    All other components within normal limits  SARS CORONAVIRUS 2 BY RT PCR  HCG, SERUM, QUALITATIVE    EKG None  Radiology DG Chest 2 View  Result Date: 12/13/2022 CLINICAL DATA:  37 year old female with a history of cough, hemoptysis EXAM: CHEST - 2 VIEW COMPARISON:  05/06/2022 FINDINGS: Cardiomediastinal silhouette unchanged in size and contour. No evidence of central vascular congestion. No interlobular septal thickening. No pneumothorax or pleural effusion. No confluent airspace disease. No acute displaced fracture IMPRESSION: No active cardiopulmonary disease. Electronically Signed   By: Gilmer Mor D.O.   On: 12/13/2022 12:30    Procedures Procedures   Medications Ordered in ED Medications  LORazepam (ATIVAN) tablet 1 mg (1 mg Oral Given 12/13/22 1011)    ED Course/ Medical Decision Making/ A&P  Medical Decision Making  36 year old female presents to ED for evaluation.  Please see HPI for further details.  On examination the patient is afebrile and tachycardic most likely secondary to her anxiety.  Lung sounds are clear bilaterally, she is not hypoxic.  Her abdomen is soft and compressible throughout.  Neurological examination is at baseline.  No deficits noted.  Patient overall very anxious in appearance, crying.  Patient CBC without leukocytosis or  anemia.  Metabolic panel shows increased creatinine 1.03 which is near patient baseline of 0.88.  She also has an elevated anion gap of 16 but glucose 96.  Patient electrolytes showed no derangement.  COVID swab negative.  Chest x-ray unremarkable.  Patient given 1 mg Ativan.  States that her anxiety has decreased however still anxious.  At this time, patient advised that most likely cause of hemoptysis is bronchitis.  Advised to follow-up with PCP.  Reassurance provided.  Discharge.   Final Clinical Impression(s) / ED Diagnoses Final diagnoses:  Anxiety  Cough with hemoptysis    Rx / DC Orders ED Discharge Orders     None         Al Decant, PA-C 12/13/22 1303  Melene Plan, DO 12/13/22 [phone]

## 2022-12-13 NOTE — Discharge Instructions (Addendum)
It was a pleasure taking part in your care today.  As we discussed, most likely cause of blood in sputum is due to proctitis.  Please read the attached guide concerning hemoptysis which is blood in sputum.  Please follow-up with your PCP for further management.  Please return to the ED with any new symptoms.

## 2022-12-13 NOTE — ED Triage Notes (Signed)
EMS stated, pt stated, she has had bronchitis and I've had some blood in her cough. ;My blood was pink. She was on Azithromycin. And finished yesterday. Anxiety  Pt took a Xanax .5mg  this morning at 530

## 2022-12-14 NOTE — Progress Notes (Unsigned)
New Patient Office Visit  Subjective:  Patient ID: Samantha Nguyen, female    DOB: [birth year]  Age: 37 y.o. MRN: [id number]  CC: No chief complaint on file.   HPI Samantha Nguyen presents for establishing care today.  Assessment & Plan:  Immunization due    Subjective:    Outpatient Medications Prior to Visit  Medication Sig Dispense Refill   albuterol (VENTOLIN HFA) 108 (90 Base) MCG/ACT inhaler Inhale 2 puffs into the lungs every 6 (six) hours as needed for wheezing or shortness of breath. 8 g 0   ALPRAZolam (XANAX) 0.5 MG tablet Take 1 tablet (0.5 mg total) by mouth 2 (two) times daily as needed for sleep or anxiety. 10 tablet 0   benzonatate (TESSALON) 100 MG capsule Take 1 capsule (100 mg total) by mouth 3 (three) times daily as needed for cough. 30 capsule 0   PARoxetine (PAXIL) 20 MG tablet Take 1 tablet (20 mg total) by mouth daily. 30 tablet 0   No facility-administered medications prior to visit.   Past Medical History:  Diagnosis Date   Anxiety    Depressed    History of kidney stones    Migraine    Renal disorder    Past Surgical History:  Procedure Laterality Date   CESAREAN SECTION     CYSTOSCOPY W/ URETERAL STENT PLACEMENT Right 08/24/2016   Procedure: CYSTOSCOPY WITH RETROGRADE PYELOGRAM/URETERAL STENT PLACEMENT;  Surgeon: Malen Gauze, MD;  Location: [facility];  Service: Urology;  Laterality: Right;   CYSTOSCOPY WITH RETROGRADE PYELOGRAM, URETEROSCOPY AND STENT PLACEMENT Right 09/09/2016   Procedure: CYSTOSCOPY WITH RETROGRADE PYELOGRAM, URETEROSCOPY AND STENT REPLACEMENT;  Surgeon: Malen Gauze, MD;  Location: [facility];  Service: Urology;  Laterality: Right;   MANDIBLE SURGERY      Objective:   Today's Vitals: There were no vitals taken for this visit.  Physical Exam Vitals and nursing note reviewed.  Constitutional:      Appearance: Normal appearance.  Cardiovascular:     Rate and Rhythm: Normal rate and regular rhythm.   Pulmonary:     Effort: Pulmonary effort is normal.     Breath sounds: Normal breath sounds.  Musculoskeletal:        General: Normal range of motion.  Skin:    General: Skin is warm and dry.  Neurological:     Mental Status: She is alert.  Psychiatric:        Mood and Affect: Mood normal.        Behavior: Behavior normal.     No orders of the defined types were placed in this encounter.   Dulce Sellar, NP

## 2022-12-14 NOTE — Patient Instructions (Signed)
Welcome to [facility] at [place], It was a pleasure meeting you today!    As discussed, I have sent your refills to your pharmacy.  I have sent a referral to ***  Please schedule a *** month follow up visit today.    PLEASE NOTE: If you had any LAB tests please let us know if you have not heard back within a few days. You may see your results on MyChart before we have a chance to review them but we will give you a call once they are reviewed by us. If we ordered any REFERRALS today, please let us know if you have not heard from their office within the next week.  Let us know through MyChart if you are needing REFILLS, or have your pharmacy send us the request. You can also use MyChart to communicate with me or any office staff.  Please try these tips to maintain a healthy lifestyle: It is important that you exercise regularly at least 30 minutes 5 times a week. Think about what you will eat, plan ahead. Choose whole foods, & think  "clean, green, fresh or frozen" over canned, processed or packaged foods which are more sugary, salty, and fatty. 70 to 75% of food eaten should be fresh vegetables and protein. 2-3  meals daily with healthy snacks between meals, but must be whole fruit, protein or vegetables. Aim to eat over a 10 hour period when you are active, for example, 7am to 5pm, and then STOP after your last meal of the day, drinking only water.  Shorter eating windows, 6-8 hours, are showing benefits in heart disease and blood sugar regulation. Drink water every day! Shoot for 64 ounces daily = 8 cups, no other drink is as healthy! Fruit juice is best enjoyed in a healthy way, by EATING the fruit.   

## 2022-12-15 ENCOUNTER — Encounter: Payer: Self-pay | Admitting: Family

## 2022-12-15 ENCOUNTER — Ambulatory Visit (INDEPENDENT_AMBULATORY_CARE_PROVIDER_SITE_OTHER): Payer: MEDICAID | Admitting: Family

## 2022-12-15 VITALS — BP 128/87 | HR 113 | Temp 97.8°F | Ht 67.0 in | Wt 164.2 lb

## 2022-12-15 DIAGNOSIS — E559 Vitamin D deficiency, unspecified: Secondary | ICD-10-CM

## 2022-12-15 DIAGNOSIS — F4521 Hypochondriasis: Secondary | ICD-10-CM

## 2022-12-15 DIAGNOSIS — F5105 Insomnia due to other mental disorder: Secondary | ICD-10-CM

## 2022-12-15 DIAGNOSIS — Z23 Encounter for immunization: Secondary | ICD-10-CM

## 2022-12-15 LAB — VITAMIN D 25 HYDROXY (VIT D DEFICIENCY, FRACTURES): VITD: 53.87 ng/mL (ref 30.00–100.00)

## 2022-12-15 MED ORDER — HYDROXYZINE HCL 25 MG PO TABS
25.0000 mg | ORAL_TABLET | Freq: Every evening | ORAL | 2 refills | Status: DC | PRN
Start: 2022-12-15 — End: 2023-01-04

## 2022-12-16 NOTE — Telephone Encounter (Signed)
Please let her know.  Also explain I know she doesn't want to miss more work, but she is likely to miss another day due to her anxiety, so if she can get an earlier appointment with [facility] and have to just miss 2 or 3 hours of work, then I think that is a better choice.

## 2022-12-19 ENCOUNTER — Other Ambulatory Visit: Payer: Self-pay

## 2022-12-19 ENCOUNTER — Encounter: Payer: Self-pay | Admitting: Family

## 2022-12-20 NOTE — Telephone Encounter (Signed)
I have never had a patient have high blood pressure or heart rate issues on Hydroxyzine. She needs to discuss with her new Psychiatrist as they will be prescribing all anxiety medications from now on.  OK to provide work note for today (Monday).

## 2022-12-21 NOTE — Telephone Encounter (Signed)
No further action needed.

## 2022-12-22 ENCOUNTER — Ambulatory Visit (HOSPITAL_COMMUNITY): Payer: MEDICAID | Admitting: Student

## 2022-12-22 ENCOUNTER — Other Ambulatory Visit (HOSPITAL_BASED_OUTPATIENT_CLINIC_OR_DEPARTMENT_OTHER): Payer: Self-pay

## 2022-12-22 ENCOUNTER — Emergency Department (HOSPITAL_BASED_OUTPATIENT_CLINIC_OR_DEPARTMENT_OTHER)
Admission: EM | Admit: 2022-12-22 | Discharge: 2022-12-22 | Disposition: A | Discharge status: Home / Self Care | Payer: MEDICAID | Attending: Emergency Medicine | Admitting: Emergency Medicine

## 2022-12-22 ENCOUNTER — Encounter (HOSPITAL_BASED_OUTPATIENT_CLINIC_OR_DEPARTMENT_OTHER): Payer: Self-pay

## 2022-12-22 ENCOUNTER — Other Ambulatory Visit: Payer: Self-pay

## 2022-12-22 DIAGNOSIS — N189 Chronic kidney disease, unspecified: Secondary | ICD-10-CM | POA: Insufficient documentation

## 2022-12-22 DIAGNOSIS — F419 Anxiety disorder, unspecified: Secondary | ICD-10-CM | POA: Insufficient documentation

## 2022-12-22 DIAGNOSIS — Z87891 Personal history of nicotine dependence: Secondary | ICD-10-CM | POA: Insufficient documentation

## 2022-12-22 MED ORDER — LORAZEPAM 1 MG PO TABS
0.5000 mg | ORAL_TABLET | Freq: Once | ORAL | Status: AC
  Administered 2022-12-22: 0.5 mg via ORAL
  Filled 2022-12-22: qty 1

## 2022-12-22 MED ORDER — DIAZEPAM 5 MG PO TABS
5.0000 mg | ORAL_TABLET | Freq: Once | ORAL | Status: AC
  Administered 2022-12-22: 5 mg via ORAL
  Filled 2022-12-22: qty 1

## 2022-12-22 NOTE — ED Provider Notes (Signed)
after intervention  [SG]    Clinical Course User Index [SG] Sloan Leiter, DO     Patient here with worsening anxiety after medication adjustment by outpatient psychiatry provider.  She has no other complaints at this time.  Will give anxiolytic, reassess.  Recommend outpatient psychiatry evaluation, [facility] information given  The patient improved significantly and was discharged in stable condition. Detailed discussions were had with the patient regarding current findings, and need for close f/u with PCP or on call doctor. The patient has been instructed to return immediately if the symptoms worsen in any way for re-evaluation. Patient verbalized understanding and is in agreement with current care plan. All questions answered prior to discharge.                 Additional history obtained: -Additional history obtained from na -External records from outside source obtained and reviewed including: Chart review including previous notes, labs, imaging, consultation notes including  Home meds Prior ed visits Prior labs/imaging    Lab Tests: na  EKG   EKG Interpretation Date/Time:    Ventricular Rate:    PR Interval:    QRS Duration:    QT Interval:    QTC Calculation:   R Axis:      Text Interpretation:           Imaging Studies ordered: na   Medicines ordered and prescription drug management: Meds ordered this encounter  Medications   diazepam (VALIUM) tablet 5 mg   LORazepam (ATIVAN) tablet 0.5 mg    -I have reviewed the patients home medicines and have made adjustments as needed   Consultations Obtained: na   Cardiac Monitoring: Continuous pulse oximetry interpreted by myself,  99% on RA.    Social Determinants of Health:  Diagnosis or treatment significantly limited by social determinants of health: former smoker   Reevaluation: After the interventions noted above, I reevaluated the patient and found that they have improved  Co morbidities that complicate the patient evaluation  Past Medical History:  Diagnosis Date   Allergy Feb 14, 1986   Have had them my whole life   Anxiety    Chronic kidney disease    Costochondritis 09/02/2022   Depressed    History of kidney stones    MDD (major depressive disorder), single episode, severe (HCC) 04/28/2022   Migraine    Other chest pain 09/02/2022   Renal disorder       Dispostion: Disposition decision including need for hospitalization was considered, and patient discharged from emergency department.    Final Clinical Impression(s) / ED Diagnoses Final diagnoses:  Anxiety        Sloan Leiter, DO 12/22/22 1115  [place] EMERGENCY DEPARTMENT AT [facility] Provider Note  CSN: [id number] Arrival date & time: 12/22/22 0807  Chief Complaint(s) Anxiety  HPI Samantha Nguyen is a 37 y.o. female with past medical history as below, significant for CKD, anxiety, MDD, migraines, HLD, illness anxiety disorder who presents to the ED with complaint of anxiety  Patient reports she is being cycle off of Paxil and has been started on Effexor, she has been on Effexor for approximately 5 days.  Reports worse anxiety during that time.  She has had to use her.  Xanax more frequently over the past few days.  Anxiety attack this morning around 2 AM, took Xanax and was able to back to sleep.  Woke up again and felt worse anxiety.  Reports she is very concerned about her health, she has severe anxiety regarding her health and PTSD associated with prior hospitalization.  She has no chest pain or palpitations, no nausea or vomiting, no SI or HI.  Hallucinations.  Follows with outpatient psychology  Past Medical History Past Medical History:  Diagnosis Date   Allergy 02/16/1986   Have had them my whole life   Anxiety    Chronic kidney disease    Costochondritis 09/02/2022   Depressed    History of kidney stones    MDD (major depressive disorder), single episode, severe (HCC) 04/28/2022   Migraine    Other chest pain 09/02/2022   Renal disorder    Patient Active Problem List   Diagnosis Date Noted   Gastroesophageal reflux disease without esophagitis 09/02/2022   Mixed hyperlipidemia 09/02/2022   Elevated blood pressure reading 07/27/2022   Tension-type headache, not intractable 07/27/2022   MDD (major depressive disorder), recurrent episode, moderate (HCC) 05/02/2022   Generalized anxiety disorder 05/02/2022   Illness anxiety disorder 04/28/2022   Sepsis due to urinary tract infection (HCC) 08/24/2016   Home Medication(s) Prior to Admission medications   Medication Sig Start Date End Date Taking?  Authorizing Provider  venlafaxine (EFFEXOR) 37.5 MG tablet Take 37.5 mg by mouth daily.   Yes [provider]  ALPRAZolam (XANAX) 0.5 MG tablet Take 1 tablet (0.5 mg total) by mouth 2 (two) times daily as needed for sleep or anxiety. 07/27/22 07/27/23  Orson Eva, NP  hydrOXYzine (ATARAX) 25 MG tablet Take 1-2 tablets (25-50 mg total) by mouth at bedtime as needed for anxiety (insomnia). 12/15/22   Dulce Sellar, NP  PARoxetine (PAXIL) 20 MG tablet Take 1 tablet (20 mg total) by mouth daily. Patient taking differently: Take 10 mg by mouth daily. 05/18/22   Bobbye Morton, MD                                                                                                                                    Past Surgical History Past Surgical History:  Procedure Laterality Date   CESAREAN SECTION     CYSTOSCOPY W/ URETERAL STENT PLACEMENT Right 08/24/2016  after intervention  [SG]    Clinical Course User Index [SG] Sloan Leiter, DO     Patient here with worsening anxiety after medication adjustment by outpatient psychiatry provider.  She has no other complaints at this time.  Will give anxiolytic, reassess.  Recommend outpatient psychiatry evaluation, [facility] information given  The patient improved significantly and was discharged in stable condition. Detailed discussions were had with the patient regarding current findings, and need for close f/u with PCP or on call doctor. The patient has been instructed to return immediately if the symptoms worsen in any way for re-evaluation. Patient verbalized understanding and is in agreement with current care plan. All questions answered prior to discharge.                 Additional history obtained: -Additional history obtained from na -External records from outside source obtained and reviewed including: Chart review including previous notes, labs, imaging, consultation notes including  Home meds Prior ed visits Prior labs/imaging    Lab Tests: na  EKG   EKG Interpretation Date/Time:    Ventricular Rate:    PR Interval:    QRS Duration:    QT Interval:    QTC Calculation:   R Axis:      Text Interpretation:           Imaging Studies ordered: na   Medicines ordered and prescription drug management: Meds ordered this encounter  Medications   diazepam (VALIUM) tablet 5 mg   LORazepam (ATIVAN) tablet 0.5 mg    -I have reviewed the patients home medicines and have made adjustments as needed   Consultations Obtained: na   Cardiac Monitoring: Continuous pulse oximetry interpreted by myself,  99% on RA.    Social Determinants of Health:  Diagnosis or treatment significantly limited by social determinants of health: former smoker   Reevaluation: After the interventions noted above, I reevaluated the patient and found that they have improved  Co morbidities that complicate the patient evaluation  Past Medical History:  Diagnosis Date   Allergy Feb 14, 1986   Have had them my whole life   Anxiety    Chronic kidney disease    Costochondritis 09/02/2022   Depressed    History of kidney stones    MDD (major depressive disorder), single episode, severe (HCC) 04/28/2022   Migraine    Other chest pain 09/02/2022   Renal disorder       Dispostion: Disposition decision including need for hospitalization was considered, and patient discharged from emergency department.    Final Clinical Impression(s) / ED Diagnoses Final diagnoses:  Anxiety        Sloan Leiter, DO 12/22/22 1115  [place] EMERGENCY DEPARTMENT AT [facility] Provider Note  CSN: [id number] Arrival date & time: 12/22/22 0807  Chief Complaint(s) Anxiety  HPI Samantha Nguyen is a 37 y.o. female with past medical history as below, significant for CKD, anxiety, MDD, migraines, HLD, illness anxiety disorder who presents to the ED with complaint of anxiety  Patient reports she is being cycle off of Paxil and has been started on Effexor, she has been on Effexor for approximately 5 days.  Reports worse anxiety during that time.  She has had to use her.  Xanax more frequently over the past few days.  Anxiety attack this morning around 2 AM, took Xanax and was able to back to sleep.  Woke up again and felt worse anxiety.  Reports she is very concerned about her health, she has severe anxiety regarding her health and PTSD associated with prior hospitalization.  She has no chest pain or palpitations, no nausea or vomiting, no SI or HI.  Hallucinations.  Follows with outpatient psychology  Past Medical History Past Medical History:  Diagnosis Date   Allergy 02/16/1986   Have had them my whole life   Anxiety    Chronic kidney disease    Costochondritis 09/02/2022   Depressed    History of kidney stones    MDD (major depressive disorder), single episode, severe (HCC) 04/28/2022   Migraine    Other chest pain 09/02/2022   Renal disorder    Patient Active Problem List   Diagnosis Date Noted   Gastroesophageal reflux disease without esophagitis 09/02/2022   Mixed hyperlipidemia 09/02/2022   Elevated blood pressure reading 07/27/2022   Tension-type headache, not intractable 07/27/2022   MDD (major depressive disorder), recurrent episode, moderate (HCC) 05/02/2022   Generalized anxiety disorder 05/02/2022   Illness anxiety disorder 04/28/2022   Sepsis due to urinary tract infection (HCC) 08/24/2016   Home Medication(s) Prior to Admission medications   Medication Sig Start Date End Date Taking?  Authorizing Provider  venlafaxine (EFFEXOR) 37.5 MG tablet Take 37.5 mg by mouth daily.   Yes [provider]  ALPRAZolam (XANAX) 0.5 MG tablet Take 1 tablet (0.5 mg total) by mouth 2 (two) times daily as needed for sleep or anxiety. 07/27/22 07/27/23  Orson Eva, NP  hydrOXYzine (ATARAX) 25 MG tablet Take 1-2 tablets (25-50 mg total) by mouth at bedtime as needed for anxiety (insomnia). 12/15/22   Dulce Sellar, NP  PARoxetine (PAXIL) 20 MG tablet Take 1 tablet (20 mg total) by mouth daily. Patient taking differently: Take 10 mg by mouth daily. 05/18/22   Bobbye Morton, MD                                                                                                                                    Past Surgical History Past Surgical History:  Procedure Laterality Date   CESAREAN SECTION     CYSTOSCOPY W/ URETERAL STENT PLACEMENT Right 08/24/2016

## 2022-12-22 NOTE — Discharge Instructions (Signed)
Please go to [facility] for further evaluation if you would like to be seen by someone from psychiatry team today, otherwise follow up with your primary care psychiatry provider  It was a pleasure caring for you today in the emergency department.  Please return to the emergency department for any worsening or worrisome symptoms.

## 2022-12-22 NOTE — ED Triage Notes (Signed)
In for eval of multiple anxiety attacks over yesterday and today. Psych recently changed her medications. Coming off of Paxil and going onto Effexor. Sees counselor, last visit on Monday.

## 2022-12-24 ENCOUNTER — Emergency Department (HOSPITAL_BASED_OUTPATIENT_CLINIC_OR_DEPARTMENT_OTHER)
Admission: EM | Admit: 2022-12-24 | Discharge: 2022-12-24 | Disposition: A | Discharge status: Home / Self Care | Payer: MEDICAID | Attending: Emergency Medicine | Admitting: Emergency Medicine

## 2022-12-24 ENCOUNTER — Other Ambulatory Visit: Payer: Self-pay

## 2022-12-24 ENCOUNTER — Encounter (HOSPITAL_BASED_OUTPATIENT_CLINIC_OR_DEPARTMENT_OTHER): Payer: Self-pay | Admitting: Emergency Medicine

## 2022-12-24 ENCOUNTER — Emergency Department (HOSPITAL_BASED_OUTPATIENT_CLINIC_OR_DEPARTMENT_OTHER): Payer: MEDICAID

## 2022-12-24 DIAGNOSIS — R079 Chest pain, unspecified: Secondary | ICD-10-CM | POA: Insufficient documentation

## 2022-12-24 DIAGNOSIS — N189 Chronic kidney disease, unspecified: Secondary | ICD-10-CM | POA: Diagnosis not present

## 2022-12-24 DIAGNOSIS — F419 Anxiety disorder, unspecified: Secondary | ICD-10-CM | POA: Diagnosis present

## 2022-12-24 DIAGNOSIS — R4589 Other symptoms and signs involving emotional state: Secondary | ICD-10-CM

## 2022-12-24 LAB — BASIC METABOLIC PANEL
Anion gap: 8 (ref 5–15)
BUN: 10 mg/dL (ref 6–20)
CO2: 26 mmol/L (ref 22–32)
Calcium: 9.7 mg/dL (ref 8.9–10.3)
Chloride: 105 mmol/L (ref 98–111)
Creatinine, Ser: 0.81 mg/dL (ref 0.44–1.00)
GFR, Estimated: >60 mL/min (ref >60)
Glucose, Bld: 89 mg/dL (ref 70–99)
Potassium: 3.9 mmol/L (ref 3.5–5.1)
Sodium: 139 mmol/L (ref 135–145)

## 2022-12-24 LAB — CBC
HCT: 39.8 % (ref 36.0–46.0)
Hemoglobin: 13.4 g/dL (ref 12.0–15.0)
MCH: 28.8 pg (ref 26.0–34.0)
MCHC: 33.7 g/dL (ref 30.0–36.0)
MCV: 85.4 fL (ref 80.0–100.0)
Platelets: 357 10*3/uL (ref 150–400)
RBC: 4.66 MIL/uL (ref 3.87–5.11)
RDW: 11.9 % (ref 11.5–15.5)
WBC: 7 10*3/uL (ref 4.0–10.5)
nRBC: 0 % (ref 0.0–0.2)

## 2022-12-24 LAB — TROPONIN I (HIGH SENSITIVITY): Troponin I (High Sensitivity): <2 ng/L (ref <18)

## 2022-12-24 NOTE — Discharge Instructions (Signed)
Thank you for allowing [name] to be a part of your care today.  Your workup today is overall very reassuring.  Your x-ray was negative.  Your troponin, which is a heart specific lab, was also negative.  Continue taking your medication as prescribed.  I recommend reaching out to your provider who prescribed this medication if you continue to have adverse effects.   Return to the ED if you develop worsening of your symptoms, have chest pain, shortness of breath, palpitations, or any new concerns.

## 2022-12-24 NOTE — ED Triage Notes (Signed)
"  I just got put on a new anxiety medication and I just feel weird" Took xanax 0.5 at home around 3pm, not feeling better "I just dont wanna die"  Started with chest pain this AM  Tearful in triage Seen on 12/22/2022 for similar

## 2022-12-24 NOTE — ED Notes (Signed)
Patient resting quietly in stretcher, respirations even, unlabored, no acute distress noted. Patient ambulatory to restroom with steady gait.

## 2022-12-24 NOTE — ED Provider Notes (Signed)
[place] EMERGENCY DEPARTMENT AT [facility] Provider Note   CSN: [id number] Arrival date & time: 12/24/22  1616     History  Chief Complaint  Patient presents with   Anxiety    Samantha Nguyen is a 37 y.o. female with past medical history significant for illness anxiety disorder, MDD, GAD, migraines, CKD presents to the ED with concerns about a new medication she has started taking.  Patient states the medication is making her "feel weird".  Patient took 0.5 mg Xanax at home around 3 PM without improvement in her symptoms.  She reports it began with chest pain and palpitations this morning.  Patient states "I just do not want to die".  Patient is very concerned about her health and has severe anxiety regarding her health.  Denies shortness of breath, syncope, hallucinations, SI or HI.  Patient has follow-up with psychiatry in November.  New medication is Effexor.  Patient reports that she looked up side effects of this medication and is very concerned about potential effects on the heart.         Home Medications Prior to Admission medications   Medication Sig Start Date End Date Taking? Authorizing Provider  ALPRAZolam Prudy Feeler) 0.5 MG tablet Take 1 tablet (0.5 mg total) by mouth 2 (two) times daily as needed for sleep or anxiety. 07/27/22 07/27/23  Orson Eva, NP  hydrOXYzine (ATARAX) 25 MG tablet Take 1-2 tablets (25-50 mg total) by mouth at bedtime as needed for anxiety (insomnia). 12/15/22   Dulce Sellar, NP  PARoxetine (PAXIL) 20 MG tablet Take 1 tablet (20 mg total) by mouth daily. Patient taking differently: Take 10 mg by mouth daily. 05/18/22   Bobbye Morton, MD  venlafaxine (EFFEXOR) 37.5 MG tablet Take 37.5 mg by mouth daily.    [provider]      Allergies    Sulfa antibiotics    Review of Systems   Review of Systems  Respiratory:  Negative for shortness of breath.   Cardiovascular:  Positive for chest pain and palpitations.   Psychiatric/Behavioral:  Negative for hallucinations and suicidal ideas. The patient is nervous/anxious.     Physical Exam Updated Vital Signs BP (!) 135/93   Pulse 89   Temp 98 F (36.7 C) (Oral)   Resp 11   LMP 12/22/2022   SpO2 100%  Physical Exam Vitals and nursing note reviewed.  Constitutional:      General: She is not in acute distress.    Appearance: Normal appearance. She is not ill-appearing or diaphoretic.  Cardiovascular:     Rate and Rhythm: Normal rate and regular rhythm.  Pulmonary:     Effort: Pulmonary effort is normal.  Skin:    General: Skin is warm and dry.     Capillary Refill: Capillary refill takes less than 2 seconds.  Neurological:     Mental Status: She is alert. Mental status is at baseline.  Psychiatric:        Attention and Perception: Attention normal.        Mood and Affect: Mood is anxious. Affect is tearful.        Speech: Speech normal.        Behavior: Behavior normal. Behavior is cooperative.        Thought Content: Thought content normal. Thought content does not include homicidal or suicidal ideation.     ED Results / Procedures / Treatments   Labs (all labs ordered are listed, but only abnormal results are displayed) Labs Reviewed  BASIC METABOLIC PANEL  CBC  TROPONIN I (HIGH SENSITIVITY)    EKG EKG Interpretation Date/Time:  Friday December 24 2022 16:37:51 EDT Ventricular Rate:  120 PR Interval:  124 QRS Duration:  84 QT Interval:  314 QTC Calculation: 443 R Axis:   98  Text Interpretation: Sinus tachycardia Rightward axis Borderline ECG When compared with ECG of 13-Dec-2022 09:57, No significant change was found Confirmed by Meridee Score [phone]) on 12/24/2022 5:13:50 PM  Radiology DG Chest Port 1 View  Result Date: 12/24/2022 CLINICAL DATA:  Chest pain EXAM: PORTABLE CHEST 1 VIEW COMPARISON:  12/13/2022 FINDINGS: The heart size and mediastinal contours are within normal limits. Both lungs are clear. The  visualized skeletal structures are unremarkable. IMPRESSION: No active disease. Electronically Signed   By: Jasmine Pang M.D.   On: 12/24/2022 20:20    Procedures Procedures    Medications Ordered in ED Medications - No data to display  ED Course/ Medical Decision Making/ A&P                                 Medical Decision Making Amount and/or Complexity of Data Reviewed Labs: ordered. Radiology: ordered.   This patient presents to the ED with chief complaint(s) of chest pain, palpitations with pertinent past medical history of GAD, MDD, illness anxiety disorder, PTSD.  The complaint involves an extensive differential diagnosis and also carries with it a high risk of complications and morbidity.    The differential diagnosis includes ACS, anxiety, medication adverse effect   The initial plan is to obtain chest pain workup  Additional history obtained: Records reviewed Primary Care Documents  Initial Assessment:   Exam significant for anxious and tearful patient who is not in acute distress.  Heart rate is normal in the 80s to 90s with regular rhythm.  Normal sinus on the monitor.  Patient's speech is clear, and is not rapid or pressured.  Patient is overall otherwise well-appearing.  Independent ECG/labs interpretation:  The following labs were independently interpreted:  CBC and metabolic panel without abnormality.  Troponin is negative.  Independent visualization and interpretation of imaging: I independently visualized the following imaging with scope of interpretation limited to determining acute life threatening conditions related to emergency care: chest x-ray, which revealed no acute findings.  Disposition:   Patient's workup today is reassuring.  Discussed this with patient and recommended that she follow-up with psychiatry as scheduled and reach out to her prescribing provider if she has adverse effects to new medication.  The patient has been appropriately medically  screened and/or stabilized in the ED. I have low suspicion for any other emergent medical condition which would require further screening, evaluation or treatment in the ED or require inpatient management. At time of discharge the patient is hemodynamically stable and in no acute distress. I have discussed work-up results and diagnosis with patient and answered all questions. Patient is agreeable with discharge plan. We discussed strict return precautions for returning to the emergency department and they verbalized understanding.            Final Clinical Impression(s) / ED Diagnoses Final diagnoses:  Chest pain, unspecified type  Anxiety about health    Rx / DC Orders ED Discharge Orders     None         Lenard Simmer, PA-C 12/24/22 2046    Terrilee Files, MD 12/25/22 1051

## 2022-12-26 ENCOUNTER — Ambulatory Visit (HOSPITAL_COMMUNITY)
Admission: EM | Admit: 2022-12-26 | Discharge: 2022-12-26 | Disposition: A | Discharge status: Home / Self Care | Payer: MEDICAID | Attending: Psychiatry | Admitting: Psychiatry

## 2022-12-26 DIAGNOSIS — F411 Generalized anxiety disorder: Secondary | ICD-10-CM | POA: Insufficient documentation

## 2022-12-26 MED ORDER — ALPRAZOLAM 0.5 MG PO TABS
0.5000 mg | ORAL_TABLET | Freq: Once | ORAL | Status: AC
  Administered 2022-12-26: 0.5 mg via ORAL
  Filled 2022-12-26: qty 1

## 2022-12-26 NOTE — ED Notes (Signed)
Pt discharged with  AVS.  AVS reviewed prior to discharge.  Pt alert, oriented, and ambulatory.  Safety maintained.Patient was given xnanx.States that she takes the medication daily. Patient husband is with her . Provider states that she can leave after medication was given.

## 2022-12-26 NOTE — ED Provider Notes (Signed)
Behavioral Health Urgent Care Medical Screening Exam  Patient Name: Samantha Nguyen MRN: [id number] Date of Evaluation: 12/26/22 Chief Complaint:  "My anxiety is so bad, I feel hopeless" Diagnosis:  Final diagnoses:  Generalized anxiety disorder    History of Present illness: Samantha Nguyen is a 37 y.o. female. patient presented to [facility] as a walk in accompanied by her partner with complaints of "my anxiety is so bad, I feel hopeless"  Samantha Nguyen, 37 y.o., female patient seen face to face by this provider, chart reviewed, and consulted with Dr. Clovis Riley  on 12/26/22.  Patient reports she has services in place with [facility] [employer].  She also has therapy in place.  She lives in the home with her partner.  She works full-time as a Wellsite geologist.  On assessment patient reports she has been on many different medications in the past.  Recently she was prescribed Paxil and her provider begin to taper her off of medication.  This past week was patient was being tapered off the Paxil and Effexor 37.5 mg was added daily.  This past Friday (2 days ago) was the last day of her Paxil taper and was also the day that her provider told her to increase Effexor to 75 mg.  Patient did not increase her Effexor.  Since Friday she has noticed a drastic increase in her anxiety and believes that she is becoming so anxious that she is going into full-blown panic attacks.  She has struggled with depression for years and has completed the [facility] partial hospitalization program this past February.  She admits the program was helpful.  She felt that she was anxious but was able to work and go about her daily activities.  However since Friday she has been unable to do anything.  States all she has done is sleep and cry.  Her anxiety is causing her to be more depressed as she feels hopeless.  She also endorses sadness, tearfulness, and irritability.  She presents requesting to be admitted to the inpatient psychiatric  hospital.  During evaluation Samantha Nguyen is dressed in her pajamas sitting in the assessment room looking down at the floor.  She is tearful throughout the assessment.  She is alert/oriented x 4, cooperative, and makes fair eye contact.  Her speech is clear, coherent, and a decreased volume.  Reports she has been unable to function in her daily life due to her increased anxiety.  She works full-time as an Wellsite geologist and it has been difficult for her to present to work and she has to leave abruptly at times due to her anxiety.  She identifies no stressor/trigger for her anxiety.  She has a depressed affect.  She denies suicidal/homicidal ideation she denies auditory/visual hallucinations.  She does not appear to be responding to internal/external stimuli.  She does not appear manic, psychotic, paranoid, or delusional.  Patient is not a harm to herself or others.  She is not psychotic.  Explained to her that she does not meet criteria for inpatient psychiatric admission.  She states, "if I cannot be in the hospital can I go back to the [facility] program, it was really helpful, and they can help me with my FMLA".  A referral was made to [facility] PHP.  Instructed patient to contact her therapist. She has completed 3-4 visits for [facility] therapy.  At this time Samantha Nguyen is educated and verbalizes understanding of mental health resources and other crisis services in the community. She is instructed to call  911 and present to the nearest emergency room should she experience any suicidal/homicidal ideation, auditory/visual/hallucinations, or detrimental worsening of /her mental health condition.    Flowsheet Row ED from 12/24/2022 in [facility] Emergency Department at [facility] from 12/22/2022 in [place] Emergency Department at [facility] ED from 12/13/2022 in [facility] Emergency Department at [facility]  C-SSRS RISK CATEGORY No Risk No Risk No Risk       Psychiatric Specialty  Exam  Presentation  General Appearance:Appropriate for Environment; Casual  Eye Contact:Fair  Speech:Clear and Coherent  Speech Volume:Normal  Handedness:Right   Mood and Affect  Mood: Anxious; Depressed  Affect: Congruent; Tearful   Thought Process  Thought Processes: Coherent  Descriptions of Associations:Intact  Orientation:Full (Time, Place and Person)  Thought Content:WDL  Diagnosis of Schizophrenia or Schizoaffective disorder in past: No   Hallucinations:None  Ideas of Reference:None  Suicidal Thoughts:No  Homicidal Thoughts:No   Sensorium  Memory: Immediate Good; Recent Good; Remote Good  Judgment: Fair  Insight: Fair   Art therapist  Concentration: Good  Attention Span: Good  Recall: Good  Fund of Knowledge: Good  Language: Good   Psychomotor Activity  Psychomotor Activity: Normal   Assets  Assets: Communication Skills; Desire for Improvement; Financial Resources/Insurance; Intimacy; Physical Health; Resilience   Sleep  Sleep: Good  Number of hours:  0 (7 to 12 hours/night)   Physical Exam: Physical Exam Vitals and nursing note reviewed.  Constitutional:      General: She is not in acute distress.    Appearance: Normal appearance. She is not ill-appearing.  Eyes:     General:        Right eye: No discharge.        Left eye: No discharge.  Cardiovascular:     Rate and Rhythm: Normal rate.  Pulmonary:     Effort: Pulmonary effort is normal. No respiratory distress.  Musculoskeletal:        General: Normal range of motion.     Cervical back: Normal range of motion.  Neurological:     Mental Status: She is alert and oriented to person, place, and time.  Psychiatric:        Attention and Perception: Attention and perception normal.        Mood and Affect: Mood is anxious. Affect is tearful.        Speech: Speech normal.        Behavior: Behavior normal. Behavior is cooperative.        Thought  Content: Thought content normal.        Cognition and Memory: Cognition normal.        Judgment: Judgment normal.    Review of Systems  Constitutional: Negative.   HENT: Negative.    Eyes: Negative.   Respiratory: Negative.    Cardiovascular: Negative.   Musculoskeletal: Negative.   Skin: Negative.   Neurological: Negative.   Psychiatric/Behavioral:  The patient is nervous/anxious.    Blood pressure 135/89, pulse 90, temperature 98.6 F (37 C), temperature source Oral, resp. rate 19, last menstrual period 12/22/2022, SpO2 100%. There is no height or weight on file to calculate BMI.  Musculoskeletal: Strength & Muscle Tone: within normal limits Gait & Station: normal Patient leans: N/A   [facility] MSE Discharge Disposition for Follow up and Recommendations: Based on my evaluation the patient does not appear to have an emergency medical condition and can be discharged with resources and follow up care in outpatient services for Medication Management,  Partial Hospitalization Program, and Individual Therapy  Discharge patient  Referral made to [facility] PHP program  Encourage patient to contact her current therapist to increase therapy appointments.   Encouraged patient to reach out to her psychiatric medication management provider-[facility].  Ardis Hughs, NP 12/26/2022, 11:15AM

## 2022-12-26 NOTE — Progress Notes (Signed)
   12/26/22 1001  [facility] Triage Screening (Walk-ins at [facility] only)  How Did You Hear About [name]? Family/Friend  What Is the Reason for Your Visit/Call Today? Manami Tutor is a 37 year old female presenting to [facility] accompanied by her partner. Pt reports she is diagnosed with Panic Disorder and Severe Anxiety. Pt states, "my therapist recently diagnosed me with PTSD due to my intrusive thoughts". Pt states, "I have been having a really bad time with my anixety". Pt does report she stopped taking her Paxil on Friday and notices that she "cannot function" from stopping the medication. Her psychartist took her off this medication because she has been experiencing worsening "anxiety attacks". Pt has currently been taking venlafaxine for a week, but mentions her anxiety has increased. Pt reports she feels "hopeless". Pt reports that her ongoing anxiety has been going on for over a year and is "unsure" on how to help herself. Pt is requesting to be admitted for inpatient treatment because of her condtion at this time. Pt begins to express how she wants to feel "normal" and "like herself". Pt does report to have passive thoughts of wanting to harm herself, but does not currently have these thoughts at this time. Pt reports not past suicide attempt. Pt also mentions she had self harmed roughly 7 years ago. Pt does mention that she tends to feel this "anxious" on most days. Pt appears to be tearful and anxious during triage. Pt denies subsatnce use, SI, HI and AVH currently.  How Long Has This Been Causing You Problems? > than 6 months  Have You Recently Had Any Thoughts About Hurting Yourself? No  Are You Planning to Commit Suicide/Harm Yourself At This time? No  Have you Recently Had Thoughts About Hurting Someone Karolee Ohs? No  Are You Planning To Harm Someone At This Time? No  Are you currently experiencing any auditory, visual or other hallucinations? No  Have You Used Any Alcohol or Drugs in the Past 24 Hours? No  Do  you have any current medical co-morbidities that require immediate attention? No  Clinician description of patient physical appearance/behavior: tearful, anxious  What Do You Feel Would Help You the Most Today? Medication(s);Treatment for Depression or other mood problem;Stress Management  If access to [facility] was not available, would you have sought care in the Emergency Department? No  Determination of Need Urgent (48 hours)  Options For Referral Intensive Outpatient Therapy;Medication Management;Inpatient Hospitalization

## 2022-12-26 NOTE — Discharge Instructions (Signed)

## 2022-12-27 ENCOUNTER — Telehealth (HOSPITAL_COMMUNITY): Payer: Self-pay | Admitting: Professional

## 2022-12-27 ENCOUNTER — Telehealth (HOSPITAL_COMMUNITY): Payer: Self-pay | Admitting: Licensed Clinical Social Worker

## 2022-12-28 ENCOUNTER — Ambulatory Visit (HOSPITAL_COMMUNITY): Payer: MEDICAID

## 2022-12-28 ENCOUNTER — Telehealth: Payer: Self-pay | Admitting: Family

## 2022-12-28 NOTE — Telephone Encounter (Signed)
Patient called stating she just got back EKG results and although the ones she had done in the UC were normal, these results appear abnormal. Patient is really concerned and would like to know what's going on in terms of the results. Patient requests call back ASAP.

## 2022-12-29 ENCOUNTER — Ambulatory Visit (HOSPITAL_COMMUNITY): Payer: MEDICAID | Admitting: Student

## 2022-12-29 ENCOUNTER — Telehealth (HOSPITAL_COMMUNITY): Payer: Self-pay | Admitting: Licensed Clinical Social Worker

## 2022-12-29 NOTE — Telephone Encounter (Signed)
ECG is fine, just shows sinus tachycardia = fast heart rate. She needs to contact her Psychiatrist about her meds - tapered off Paxil & switched to Effexor, if not working she needs to let him/her know.

## 2022-12-30 ENCOUNTER — Ambulatory Visit (HOSPITAL_COMMUNITY): Payer: MEDICAID | Admitting: Professional

## 2022-12-30 DIAGNOSIS — F4521 Hypochondriasis: Secondary | ICD-10-CM

## 2022-12-30 DIAGNOSIS — F331 Major depressive disorder, recurrent, moderate: Secondary | ICD-10-CM

## 2022-12-30 DIAGNOSIS — F411 Generalized anxiety disorder: Secondary | ICD-10-CM | POA: Diagnosis not present

## 2022-12-30 NOTE — Telephone Encounter (Signed)
I called pt in regards and pt verbalized understanding.

## 2022-12-31 ENCOUNTER — Encounter (HOSPITAL_COMMUNITY): Payer: Self-pay | Admitting: *Deleted

## 2022-12-31 ENCOUNTER — Other Ambulatory Visit: Payer: Self-pay

## 2022-12-31 ENCOUNTER — Emergency Department (HOSPITAL_COMMUNITY)
Admission: EM | Admit: 2022-12-31 | Discharge: 2022-12-31 | Disposition: A | Discharge status: Home / Self Care | Payer: MEDICAID | Attending: Emergency Medicine | Admitting: Emergency Medicine

## 2022-12-31 DIAGNOSIS — N189 Chronic kidney disease, unspecified: Secondary | ICD-10-CM | POA: Diagnosis not present

## 2022-12-31 DIAGNOSIS — F419 Anxiety disorder, unspecified: Secondary | ICD-10-CM | POA: Diagnosis present

## 2022-12-31 LAB — HCG, QUANTITATIVE, PREGNANCY: hCG, Beta Chain, Quant, S: <1 m[IU]/mL (ref <5)

## 2022-12-31 LAB — RAPID URINE DRUG SCREEN, HOSP PERFORMED
Amphetamines: NOT DETECTED
Barbiturates: NOT DETECTED
Benzodiazepines: POSITIVE — AB
Cocaine: NOT DETECTED
Opiates: NOT DETECTED
Tetrahydrocannabinol: NOT DETECTED

## 2022-12-31 LAB — URINALYSIS, ROUTINE W REFLEX MICROSCOPIC
Bilirubin Urine: NEGATIVE
Glucose, UA: NEGATIVE mg/dL
Hgb urine dipstick: NEGATIVE
Ketones, ur: 80 mg/dL — AB
Leukocytes,Ua: NEGATIVE
Nitrite: NEGATIVE
Protein, ur: 30 mg/dL — AB
Specific Gravity, Urine: 1.017 (ref 1.005–1.030)
pH: 5 (ref 5.0–8.0)

## 2022-12-31 LAB — CBC WITH DIFFERENTIAL/PLATELET
Abs Immature Granulocytes: 0.02 10*3/uL (ref 0.00–0.07)
Basophils Absolute: 0 10*3/uL (ref 0.0–0.1)
Basophils Relative: 0 %
Eosinophils Absolute: 0.2 10*3/uL (ref 0.0–0.5)
Eosinophils Relative: 2 %
HCT: 40.4 % (ref 36.0–46.0)
Hemoglobin: 13.6 g/dL (ref 12.0–15.0)
Immature Granulocytes: 0 %
Lymphocytes Relative: 33 %
Lymphs Abs: 2.5 10*3/uL (ref 0.7–4.0)
MCH: 28.2 pg (ref 26.0–34.0)
MCHC: 33.7 g/dL (ref 30.0–36.0)
MCV: 83.6 fL (ref 80.0–100.0)
Monocytes Absolute: 0.6 10*3/uL (ref 0.1–1.0)
Monocytes Relative: 8 %
Neutro Abs: 4.2 10*3/uL (ref 1.7–7.7)
Neutrophils Relative %: 57 %
Platelets: 327 10*3/uL (ref 150–400)
RBC: 4.83 MIL/uL (ref 3.87–5.11)
RDW: 11.8 % (ref 11.5–15.5)
WBC: 7.4 10*3/uL (ref 4.0–10.5)
nRBC: 0 % (ref 0.0–0.2)

## 2022-12-31 LAB — COMPREHENSIVE METABOLIC PANEL
ALT: 17 U/L (ref 0–44)
AST: 19 U/L (ref 15–41)
Albumin: 4.4 g/dL (ref 3.5–5.0)
Alkaline Phosphatase: 53 U/L (ref 38–126)
Anion gap: 14 (ref 5–15)
BUN: 7 mg/dL (ref 6–20)
CO2: 21 mmol/L — ABNORMAL LOW (ref 22–32)
Calcium: 9.9 mg/dL (ref 8.9–10.3)
Chloride: 103 mmol/L (ref 98–111)
Creatinine, Ser: 0.77 mg/dL (ref 0.44–1.00)
GFR, Estimated: >60 mL/min (ref >60)
Glucose, Bld: 92 mg/dL (ref 70–99)
Potassium: 4.1 mmol/L (ref 3.5–5.1)
Sodium: 138 mmol/L (ref 135–145)
Total Bilirubin: 0.7 mg/dL (ref 0.3–1.2)
Total Protein: 7.3 g/dL (ref 6.5–8.1)

## 2022-12-31 LAB — TSH: TSH: 1.155 u[IU]/mL (ref 0.350–4.500)

## 2022-12-31 MED ORDER — LORAZEPAM 1 MG PO TABS
1.0000 mg | ORAL_TABLET | Freq: Once | ORAL | Status: AC
  Administered 2022-12-31: 1 mg via ORAL
  Filled 2022-12-31: qty 1

## 2022-12-31 NOTE — ED Triage Notes (Signed)
Patient states her anxiety level is getting worse everyday, states she sees a psychologist on 10/5 and had a medication change from Paxil to Effexor states she isn't able to sleep and feels like her anxiety is getting worse has spoke to her doctor repeatedly however states she isn't getting anywhere. Takes xanax .5mg   up to 3 times a   but it isn't helping very tearful

## 2022-12-31 NOTE — Discharge Instructions (Signed)
You have been seen today for your complaint of anxiety. Your lab work was reassuring. Your discharge medications include Unisom.  This is an over-the-counter medicine use to help with sleep.  Take this before sleep.  You may also increase your dose of Xanax to 1 mg 3 times a day as needed.  Do not take these 2 medications together. Follow up with: Your psychiatrist as soon as possible Please seek immediate medical care if you develop any of the following symptoms: Your heart feels like it is racing. You have shortness of breath. You have thoughts of hurting yourself or others. At this time there does not appear to be the presence of an emergent medical condition, however there is always the potential for conditions to change. Please read and follow the below instructions.  Do not take your medicine if  develop an itchy rash, swelling in your mouth or lips, or difficulty breathing; call 911 and seek immediate emergency medical attention if this occurs.  You may review your lab tests and imaging results in their entirety on your MyChart account.  Please discuss all results of fully with your primary care provider and other specialist at your follow-up visit.  Note: Portions of this text may have been transcribed using voice recognition software. Every effort was made to ensure accuracy; however, inadvertent computerized transcription errors may still be present.

## 2022-12-31 NOTE — ED Provider Notes (Signed)
[place] [facility]PARTMENT AT [facility] Provider Note   CSN: [id number] Arrival date & time: 12/31/22  1234     History  Chief Complaint  Patient presents with   Anxiety    Samantha Nguyen is a 37 y.o. female.  With history of MDD, PTSD, GAD, CKD presenting to the ED for evaluation of anxiety.  She states that she was cross tapered off of Paxil and onto Effexor on 12/18/2022.  This was a 6-day taper.  Since that time she has had increased stress.  She has contacted her psychiatrist numerous times regarding this and cannot get an appointment until November 6.  She also reports insomnia.  She states she typically sleeps fine when she is not anxious but has only been able to get approximately 5 hours of sleep each night.  She has been taking hydroxyzine and Xanax without improvement in her symptoms.  She denies any SI, HI, AVH.  Most recently presented to behavioral health urgent care and was told to upper Effexor to 75 mg.  She has been on this for 6 days now and reports no change in her symptoms.  She reports being told by her friend to come to the emergency department for further evaluation.  She denies any physical complaints.  She reports good sleep hygiene.  She denies any specific trigger for her anxiousness at this time.   Anxiety       Home Medications Prior to Admission medications   Medication Sig Start Date End Date Taking? Authorizing Provider  ALPRAZolam Prudy Feeler) 0.5 MG tablet Take 1 tablet (0.5 mg total) by mouth 2 (two) times daily as needed for sleep or anxiety. 07/27/22 07/27/23  Orson Eva, NP  hydrOXYzine (ATARAX) 25 MG tablet Take 1-2 tablets (25-50 mg total) by mouth at bedtime as needed for anxiety (insomnia). 12/15/22   Dulce Sellar, NP  PARoxetine (PAXIL) 20 MG tablet Take 1 tablet (20 mg total) by mouth daily. Patient taking differently: Take 10 mg by mouth daily. 05/18/22   Bobbye Morton, MD  venlafaxine (EFFEXOR) 37.5 MG tablet Take 37.5 mg  by mouth daily.    [provider]      Allergies    Sulfa antibiotics    Review of Systems   Review of Systems  Psychiatric/Behavioral:  The patient is nervous/anxious.   All other systems reviewed and are negative.   Physical Exam Updated Vital Signs BP (!) 153/108 (BP Location: Right Arm)   Pulse (!) 128   Temp 98.5 F (36.9 C)   Resp 16   Ht 5\' 7"  (1.702 m)   Wt 72.6 kg   LMP 12/22/2022   SpO2 100%   BMI 25.06 kg/m  Physical Exam Vitals and nursing note reviewed.  Constitutional:      General: She is not in acute distress.    Appearance: Normal appearance. She is normal weight. She is not ill-appearing.  HENT:     Head: Normocephalic and atraumatic.  Cardiovascular:     Rate and Rhythm: Normal rate and regular rhythm.     Heart sounds: No murmur heard. Pulmonary:     Effort: Pulmonary effort is normal. No respiratory distress.     Breath sounds: No wheezing, rhonchi or rales.  Abdominal:     General: Abdomen is flat.  Musculoskeletal:        General: Normal range of motion.     Cervical back: Neck supple.  Skin:    General: Skin is warm and dry.  Neurological:     Mental Status: She is alert and oriented to person, place, and time.  Psychiatric:        Mood and Affect: Mood is anxious and depressed.     ED Results / Procedures / Treatments   Labs (all labs ordered are listed, but only abnormal results are displayed) Labs Reviewed  COMPREHENSIVE METABOLIC PANEL - Abnormal; Notable for the following components:      Result Value   CO2 21 (*)    All other components within normal limits  URINALYSIS, ROUTINE W REFLEX MICROSCOPIC - Abnormal; Notable for the following components:   Ketones, ur 80 (*)    Protein, ur 30 (*)    Bacteria, UA RARE (*)    All other components within normal limits  RAPID URINE DRUG SCREEN, HOSP PERFORMED - Abnormal; Notable for the following components:   Benzodiazepines POSITIVE (*)    All other components within  normal limits  CBC WITH DIFFERENTIAL/PLATELET  TSH  HCG, QUANTITATIVE, PREGNANCY    EKG None  Radiology No results found.  Procedures Procedures    Medications Ordered in ED Medications  LORazepam (ATIVAN) tablet 1 mg (1 mg Oral Given 12/31/22 1453)    ED Course/ Medical Decision Making/ A&P                                 Medical Decision Making Amount and/or Complexity of Data Reviewed Labs: ordered.  Risk Prescription drug management.   This patient presents to the ED for concern of anxiety, this involves an extensive number of treatment options, and is a complaint that carries with it a high risk of complications and morbidity.  The differential diagnosis includes medication side effect, major depressive episode, anxiety  My initial workup includes labs, EKG, symptom control  Additional history obtained from: Nursing notes from this visit. Previous records within EMR system ED visit for same on 09/30/2022, 10/04/2022, 10/12/2022, 12/13/2022, 12/22/2022, 12/24/2022, 12/26/2022  I ordered, reviewed and interpreted labs which include: CBC, CMP, hCG, TSH, urine, UDS  Afebrile, hemodynamically stable.  37 year old female presenting for evaluation of anxiety.  She is well-known to department and has presented numerous times for this in the past.  She was recently discharged on her medications from Paxil to Effexor.  She has been in contact with her psychiatrist but is unable to get an appointment until next month.  She denies any SI, HI, AVH.  She has been taking hydroxyzine and Xanax for her symptoms with minimal improvement.  She has been taking half milligram of Xanax at a time.  Overall suspect her anxiety is worsened due to Effexor not yet being at therapeutic levels as this may take up to 6 weeks.  Lab workup was reassuring here today.  Ketonuria likely secondary to malnutrition as she has not had an appetite recently.  Patient was encouraged to take Unisom for sleep and to  up her dose of Xanax to 1 mg as needed but to not mix these medications.  There does not appear to be any acute psychiatric emergencies at this time.  She was encouraged to get on a waiting list for her psychiatrist and to be seen sooner if possible.  She was given return precautions.  Stable at discharge.  At this time there does not appear to be any evidence of an acute emergency medical condition and the patient appears stable for discharge with appropriate outpatient follow up.  Diagnosis was discussed with patient who verbalizes understanding of care plan and is agreeable to discharge. I have discussed return precautions with patient who verbalizes understanding. Patient encouraged to follow-up with their PCP within 1 week. All questions answered.  Note: Portions of this report may have been transcribed using voice recognition software. Every effort was made to ensure accuracy; however, inadvertent computerized transcription errors may still be present.        Final Clinical Impression(s) / ED Diagnoses Final diagnoses:  Anxiety    Rx / DC Orders ED Discharge Orders     None         Michelle Piper, Cordelia Poche 12/31/22 1543    Derwood Kaplan, MD 01/01/23 1941

## 2023-01-01 ENCOUNTER — Emergency Department (HOSPITAL_COMMUNITY)
Admission: EM | Admit: 2023-01-01 | Discharge: 2023-01-01 | Disposition: A | Discharge status: Home / Self Care | Payer: MEDICAID | Attending: Emergency Medicine | Admitting: Emergency Medicine

## 2023-01-01 ENCOUNTER — Other Ambulatory Visit: Payer: Self-pay

## 2023-01-01 ENCOUNTER — Encounter (HOSPITAL_COMMUNITY): Payer: Self-pay

## 2023-01-01 ENCOUNTER — Emergency Department (HOSPITAL_COMMUNITY): Payer: MEDICAID

## 2023-01-01 DIAGNOSIS — F419 Anxiety disorder, unspecified: Secondary | ICD-10-CM | POA: Insufficient documentation

## 2023-01-01 DIAGNOSIS — R109 Unspecified abdominal pain: Secondary | ICD-10-CM | POA: Diagnosis not present

## 2023-01-01 DIAGNOSIS — E876 Hypokalemia: Secondary | ICD-10-CM | POA: Diagnosis not present

## 2023-01-01 DIAGNOSIS — R112 Nausea with vomiting, unspecified: Secondary | ICD-10-CM | POA: Insufficient documentation

## 2023-01-01 DIAGNOSIS — M546 Pain in thoracic spine: Secondary | ICD-10-CM | POA: Diagnosis present

## 2023-01-01 LAB — URINALYSIS, ROUTINE W REFLEX MICROSCOPIC
Bilirubin Urine: NEGATIVE
Glucose, UA: NEGATIVE mg/dL
Hgb urine dipstick: NEGATIVE
Ketones, ur: NEGATIVE mg/dL
Leukocytes,Ua: NEGATIVE
Nitrite: NEGATIVE
Protein, ur: NEGATIVE mg/dL
Specific Gravity, Urine: 1.001 — ABNORMAL LOW (ref 1.005–1.030)
pH: 6 (ref 5.0–8.0)

## 2023-01-01 LAB — COMPREHENSIVE METABOLIC PANEL
ALT: 19 U/L (ref 0–44)
AST: 27 U/L (ref 15–41)
Albumin: 4.1 g/dL (ref 3.5–5.0)
Alkaline Phosphatase: 54 U/L (ref 38–126)
Anion gap: 16 — ABNORMAL HIGH (ref 5–15)
BUN: <5 mg/dL — ABNORMAL LOW (ref 6–20)
CO2: 18 mmol/L — ABNORMAL LOW (ref 22–32)
Calcium: 9.7 mg/dL (ref 8.9–10.3)
Chloride: 104 mmol/L (ref 98–111)
Creatinine, Ser: 0.98 mg/dL (ref 0.44–1.00)
GFR, Estimated: >60 mL/min (ref >60)
Glucose, Bld: 138 mg/dL — ABNORMAL HIGH (ref 70–99)
Potassium: 3.4 mmol/L — ABNORMAL LOW (ref 3.5–5.1)
Sodium: 138 mmol/L (ref 135–145)
Total Bilirubin: 0.6 mg/dL (ref 0.3–1.2)
Total Protein: 7.1 g/dL (ref 6.5–8.1)

## 2023-01-01 LAB — CBC
HCT: 40.9 % (ref 36.0–46.0)
Hemoglobin: 13.6 g/dL (ref 12.0–15.0)
MCH: 28.2 pg (ref 26.0–34.0)
MCHC: 33.3 g/dL (ref 30.0–36.0)
MCV: 84.9 fL (ref 80.0–100.0)
Platelets: 320 10*3/uL (ref 150–400)
RBC: 4.82 MIL/uL (ref 3.87–5.11)
RDW: 11.9 % (ref 11.5–15.5)
WBC: 6 10*3/uL (ref 4.0–10.5)
nRBC: 0 % (ref 0.0–0.2)

## 2023-01-01 LAB — LIPASE, BLOOD: Lipase: 26 U/L (ref 11–51)

## 2023-01-01 LAB — HCG, SERUM, QUALITATIVE: Preg, Serum: NEGATIVE

## 2023-01-01 MED ORDER — ONDANSETRON 4 MG PO TBDP
4.0000 mg | ORAL_TABLET | Freq: Three times a day (TID) | ORAL | 0 refills | Status: DC | PRN
Start: 2023-01-01 — End: 2023-01-04

## 2023-01-01 MED ORDER — TAMSULOSIN HCL 0.4 MG PO CAPS: 0.4000 mg | ORAL_CAPSULE | Freq: Every day | ORAL | 0 refills | Status: DC

## 2023-01-01 MED ORDER — OXYCODONE-ACETAMINOPHEN 5-325 MG PO TABS
1.0000 | ORAL_TABLET | Freq: Four times a day (QID) | ORAL | 0 refills | Status: DC | PRN
Start: 2023-01-01 — End: 2023-01-04

## 2023-01-01 MED ORDER — LORAZEPAM 1 MG PO TABS
1.0000 mg | ORAL_TABLET | Freq: Once | ORAL | Status: AC
  Administered 2023-01-01: 1 mg via ORAL
  Filled 2023-01-01: qty 1

## 2023-01-01 NOTE — Discharge Instructions (Signed)
You were seen in the ER today for concerns of flank pain and anxiety. Your labs and imaging are reassuring. Since an ultrasound was performed, I can not fully visualize the entire urinary system but your symptoms are consistent with a kidney stone traveling through. You have a stone in the left kidney at the moment but this should not be causing you any discomfort. I would advise taking the medications I have prescribed for you to manage this as if a stone is present. Regarding your anxiety, please take your medications as prescribed. You should follow up with a psychiatrist for further evaluation.

## 2023-01-01 NOTE — ED Provider Notes (Signed)
[place] EMERGENCY DEPARTMENT AT [facility] Provider Note   CSN: [id number] Arrival date & time: 01/01/23  1159     History Chief Complaint  Patient presents with   Back Pain    Samantha Nguyen is a 37 y.o. female.  Patient with past history significant for urosepsis, major depressive disorder, illness anxiety disorder, generalized anxiety presents to the emergency department concerns of left mid back pain.  Also endorse that she had some nausea and 1 episode of vomiting earlier today.  She does endorse that she is also currently experiencing severe anxiety that has worsened since she recently changed from Paxil to Effexor and she is concerned that the taper was too brief.  She has Xanax available at home but is somewhat hesitant to take this medication due to concerns about possible addiction.  Unsure if the nausea vomiting was due to the anxiety or possibly from a kidney stone.  She previously had a kidney stone that became septic and has significant anxiety over this at this time.  Denies any recent fevers or chills.  No obvious hematuria, dysuria, or increased urinary frequency or urgency.  In triage, evaluated by other PA and expressed concerns regarding radiation with CT imaging so requested ultrasound for possible kidney stone rule out.  Patient remarks that she was able to sleep through most the night last night after being given Ativan in the emergency department.   Back Pain      Home Medications Prior to Admission medications   Medication Sig Start Date End Date Taking? Authorizing Provider  ondansetron (ZOFRAN-ODT) 4 MG disintegrating tablet Take 1 tablet (4 mg total) by mouth every 8 (eight) hours as needed. 01/01/23  Yes Smitty Knudsen, PA-C  oxyCODONE-acetaminophen (PERCOCET/ROXICET) 5-325 MG tablet Take 1 tablet by mouth every 6 (six) hours as needed for severe pain (pain score 7-10). 01/01/23  Yes Smitty Knudsen, PA-C  tamsulosin (FLOMAX) 0.4 MG CAPS capsule  Take 1 capsule (0.4 mg total) by mouth at bedtime. 01/01/23  Yes Smitty Knudsen, PA-C  ALPRAZolam (XANAX) 0.5 MG tablet Take 1 tablet (0.5 mg total) by mouth 2 (two) times daily as needed for sleep or anxiety. 07/27/22 07/27/23  Orson Eva, NP  hydrOXYzine (ATARAX) 25 MG tablet Take 1-2 tablets (25-50 mg total) by mouth at bedtime as needed for anxiety (insomnia). 12/15/22   Dulce Sellar, NP  PARoxetine (PAXIL) 20 MG tablet Take 1 tablet (20 mg total) by mouth daily. Patient taking differently: Take 10 mg by mouth daily. 05/18/22   Bobbye Morton, MD  venlafaxine (EFFEXOR) 37.5 MG tablet Take 37.5 mg by mouth daily.    [provider]      Allergies    Sulfa antibiotics    Review of Systems   Review of Systems  Musculoskeletal:  Positive for back pain.  All other systems reviewed and are negative.   Physical Exam Updated Vital Signs BP 124/86   Pulse 96   Temp 98.6 F (37 C)   Resp 18   Ht 5\' 7"  (1.702 m)   Wt 72.6 kg   LMP 12/22/2022   SpO2 97%   BMI 25.07 kg/m  Physical Exam Vitals and nursing note reviewed.  Constitutional:      General: She is not in acute distress.    Appearance: She is well-developed.  HENT:     Head: Normocephalic and atraumatic.  Eyes:     Conjunctiva/sclera: Conjunctivae normal.  Cardiovascular:     Rate and  Rhythm: Normal rate and regular rhythm.     Heart sounds: No murmur heard. Pulmonary:     Effort: Pulmonary effort is normal. No respiratory distress.     Breath sounds: Normal breath sounds.  Abdominal:     Palpations: Abdomen is soft.     Tenderness: There is no abdominal tenderness. There is left CVA tenderness. There is no right CVA tenderness.  Musculoskeletal:        General: No swelling.     Cervical back: Neck supple.  Skin:    General: Skin is warm and dry.     Capillary Refill: Capillary refill takes less than 2 seconds.  Neurological:     Mental Status: She is alert.  Psychiatric:        Mood and  Affect: Mood normal.     ED Results / Procedures / Treatments   Labs (all labs ordered are listed, but only abnormal results are displayed) Labs Reviewed  COMPREHENSIVE METABOLIC PANEL - Abnormal; Notable for the following components:      Result Value   Potassium 3.4 (*)    CO2 18 (*)    Glucose, Bld 138 (*)    BUN <5 (*)    Anion gap 16 (*)    All other components within normal limits  URINALYSIS, ROUTINE W REFLEX MICROSCOPIC - Abnormal; Notable for the following components:   Color, Urine STRAW (*)    Specific Gravity, Urine 1.001 (*)    All other components within normal limits  LIPASE, BLOOD  CBC  HCG, SERUM, QUALITATIVE    EKG None  Radiology US RENAL  Result Date: 01/01/2023 CLINICAL DATA:  Nephrolithiasis Left leg pain for 1 day EXAM: RENAL / URINARY TRACT ULTRASOUND COMPLETE COMPARISON:  11/04/2016 FINDINGS: Right Kidney: Renal measurements: 10.4 cm = volume: 38 mL. Echogenicity within normal limits. No mass or hydronephrosis visualized. Left Kidney: Renal measurements: 12.1 cm = volume: 191 mL. Echogenicity within normal limits. No mass or hydronephrosis visualized. 5 mm echogenic structure in the mid left knee is suspicious for a nonobstructing calculus. Bladder: Appears normal for degree of bladder distention. Other: None. IMPRESSION: 1. No hydronephrosis. 2. 5 mm nonobstructing left renal calculus. Electronically Signed   By: Acquanetta Belling M.D.   On: 01/01/2023 14:36    Procedures Procedures   Medications Ordered in ED Medications  LORazepam (ATIVAN) tablet 1 mg (1 mg Oral Given 01/01/23 1619)    ED Course/ Medical Decision Making/ A&P Clinical Course as of 01/03/23 1043  Sat Jan 01, 2023  1551 hCG, serum, qualitative [OZ]    Clinical Course User Index [OZ] Smitty Knudsen, PA-C                               Medical Decision Making Amount and/or Complexity of Data Reviewed Labs: ordered. Decision-making details documented in ED  Course.  Risk Prescription drug management.   This patient presents to the ED for concern of back pain. Differential diagnosis includes lumbar strain, urolithiasis, nephrolithiasis, bowel obstruction, appendicitis   Lab Tests:  I Ordered, and personally interpreted labs.  The pertinent results include: CBC is unremarkable, CMP with mild hypokalemia 3.4 and slightly elevated anion gap likely due to GI loss, urinalysis without signs of infection and some signs of overhydration as urine is straw-colored with decrease specific gravity, hCG negative, lipase normal   Imaging Studies ordered:  I ordered imaging studies including renal US I independently visualized and  interpreted imaging which showed no urolithiasis, 5 mm nonobstructing left renal calculi I agree with the radiologist interpretation   Medicines ordered and prescription drug management:  I ordered medication including Ativan for anxiety Reevaluation of the patient after these medicines showed that the patient improved I have reviewed the patients home medicines and have made adjustments as needed   Problem List / ED Course:  Patient presents to the ED with concerns of back pain. Previously has had kidney stone that resulted in sepsis and patient is highly anxious about possible recurrence of a stone or sepsis. Was seen yesterday for concerns of anxiety in the ED and had improvement after dose of Ativan. She has Ativan 0.5mg  at home but is concerned to take this due to fears of addiction. Has reached out to her psychiatrist to try to move up her appointment but has not been able to be seen any sooner.  Physical exam reveals non-tender abdomen. Some left sided CVA tenderness. US shows stone in the left kidney but no signs of hydronephrosis. Informed patient of findings and provided reassurance, but still anxious. No acute indication for CT at this time as abdomen is non-tender and is afebrile with no white count. Will instead try  to address anxiety with dose of PO Ativan to try to have patient more settled to ensure she understands current treatment plan. Patient was significant improvement in tachycardia and generalized anxiety after dose of Ativan.  Reiterated plan with patient for management of flank pain as if it were a stone as she is still refusing to have a CT scan done at this time.  However did advise that the stone that is noted on the ultrasound is likely not to be causing her symptoms so there may be a stone in the ureter that is not visualized on the ultrasound.  Medications for expulsion therapy including tamsulosin, Percocet, and Zofran sent to patient's pharmacy.  Advised patient to try to avoid taking the Percocet and her anger benzodiazepine together.  Advised patient return the emergency department if she develops any severe urinary symptoms such as dysuria, hematuria, urgency or frequency, or begins to experience fevers or chills given prior history of urosepsis in the recent past.  Patient is in agreement with treatment plan verbalized understanding strict return precautions.  Patient discharged home in stable condition.  Final Clinical Impression(s) / ED Diagnoses Final diagnoses:  Flank pain  Anxiety    Rx / DC Orders ED Discharge Orders          Ordered    tamsulosin (FLOMAX) 0.4 MG CAPS capsule  Daily at bedtime        01/01/23 1731    oxyCODONE-acetaminophen (PERCOCET/ROXICET) 5-325 MG tablet  Every 6 hours PRN        01/01/23 1731    ondansetron (ZOFRAN-ODT) 4 MG disintegrating tablet  Every 8 hours PRN        01/01/23 1731              Smitty Knudsen, PA-C 01/03/23 1043    [place], [name], DO 01/10/23 0700

## 2023-01-01 NOTE — ED Provider Triage Note (Addendum)
Emergency Medicine Provider Triage Evaluation Note  Charesse Vanover , a 37 y.o. female  was evaluated in triage.  Pt complains of L flank painxseveral hours. Concerned for possible kidney stone. Reports chills, but no fevers. Reports nausea and vomiting.  Review of Systems  Positive: Flank pain Negative: fevesr  Physical Exam  BP (!) 132/98 (BP Location: Right Arm)   Pulse (!) 147   Temp 98.2 F (36.8 C)   Resp 18   Ht 5\' 7"  (1.702 m)   Wt 72.6 kg   LMP 12/22/2022   SpO2 100%   BMI 25.07 kg/m  Gen:   Awake, no distress   Resp:  Normal effort  MSK:   Moves extremities without difficulty  Other:  Ttp of L flank  Medical Decision Making  Medically screening exam initiated at 12:32 PM.  Appropriate orders placed.  Adeja Beringer was informed that the remainder of the evaluation will be completed by another provider, this initial triage assessment does not replace that evaluation, and the importance of remaining in the ED until their evaluation is complete.   Pt requests u/s to r/o kidney stone instead of CT.  Pete Pelt, PA 01/01/23 1233    Pete Pelt, [name] 01/01/23 1234

## 2023-01-01 NOTE — ED Triage Notes (Signed)
Pt c/o left mid back pain where kidney is and nausea started today. Pt states she believes she has a kidney stone

## 2023-01-03 ENCOUNTER — Ambulatory Visit (HOSPITAL_COMMUNITY): Payer: MEDICAID

## 2023-01-03 ENCOUNTER — Ambulatory Visit: Payer: MEDICAID | Admitting: Family

## 2023-01-03 DIAGNOSIS — F4521 Hypochondriasis: Secondary | ICD-10-CM

## 2023-01-03 DIAGNOSIS — F4310 Post-traumatic stress disorder, unspecified: Secondary | ICD-10-CM

## 2023-01-03 DIAGNOSIS — F331 Major depressive disorder, recurrent, moderate: Secondary | ICD-10-CM

## 2023-01-03 LAB — UNMAPPED LAB RESULTS
Basophil # (HT): 0.1 10 3/uL (ref 0.0–0.2)
Basophil % (HT): 1 % (ref 0–3)
Eosinophil # (HT): 0.1 10 3/uL (ref 0.0–0.6)
Eosinophil % (HT): 1 % (ref 0–5)
Hematocrit (HT): 29 % — ABNORMAL LOW (ref 35–47)
Hemoglobin (HGB) (HT): 8.4 g/dL — ABNORMAL LOW (ref 12.0–16.0)
Lymphocyte # (HT): 2.4 10 3/uL (ref 1.0–4.8)
Lymphocyte % (HT): 20 % (ref 15–45)
MCHC (HT): 28.6 g/dL — ABNORMAL LOW (ref 31.0–37.5)
MCV (HT): 79 fL — ABNORMAL LOW (ref 80–100)
Mean Corpuscular Hemoglobin (MCH) (HT): 22.6 pg — ABNORMAL LOW (ref 26.0–34.0)
Monocyte # (HT): 0.8 10 3/uL (ref 0.1–1.0)
Monocyte % (HT): 7 % (ref 0–15)
Neutrophil # (HT): 8.6 10 3/uL — ABNORMAL HIGH (ref 1.8–8.0)
Platelets (HT): 331 10 3/uL (ref 150–450)
RBC (HT): 3.72 10 6/uL — ABNORMAL LOW (ref 3.80–5.20)
RDW (HT): 17 % — ABNORMAL HIGH (ref 0.0–15.2)
Seg Neut % (HT): 71 % (ref 45–75)
WBC (HT): 12.1 10 3/uL — ABNORMAL HIGH (ref 4.0–11.0)
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
## 2023-01-04 ENCOUNTER — Ambulatory Visit (HOSPITAL_COMMUNITY): Payer: MEDICAID

## 2023-01-04 ENCOUNTER — Encounter (HOSPITAL_COMMUNITY): Payer: Self-pay

## 2023-01-04 ENCOUNTER — Emergency Department (HOSPITAL_COMMUNITY)
Admission: EM | Admit: 2023-01-04 | Discharge: 2023-01-04 | Disposition: A | Discharge status: Home / Self Care | Payer: MEDICAID | Attending: Emergency Medicine | Admitting: Emergency Medicine

## 2023-01-04 ENCOUNTER — Other Ambulatory Visit: Payer: Self-pay

## 2023-01-04 DIAGNOSIS — F32A Depression, unspecified: Secondary | ICD-10-CM | POA: Insufficient documentation

## 2023-01-04 DIAGNOSIS — F410 Panic disorder [episodic paroxysmal anxiety] without agoraphobia: Secondary | ICD-10-CM | POA: Diagnosis present

## 2023-01-04 DIAGNOSIS — N189 Chronic kidney disease, unspecified: Secondary | ICD-10-CM | POA: Insufficient documentation

## 2023-01-04 DIAGNOSIS — Z87891 Personal history of nicotine dependence: Secondary | ICD-10-CM | POA: Insufficient documentation

## 2023-01-04 DIAGNOSIS — F419 Anxiety disorder, unspecified: Secondary | ICD-10-CM | POA: Diagnosis not present

## 2023-01-04 DIAGNOSIS — F411 Generalized anxiety disorder: Secondary | ICD-10-CM | POA: Insufficient documentation

## 2023-01-04 DIAGNOSIS — F4521 Hypochondriasis: Secondary | ICD-10-CM

## 2023-01-04 LAB — COMPREHENSIVE METABOLIC PANEL
ALT: 19 U/L (ref 0–44)
AST: 21 U/L (ref 15–41)
Albumin: 4.2 g/dL (ref 3.5–5.0)
Alkaline Phosphatase: 51 U/L (ref 38–126)
Anion gap: 12 (ref 5–15)
BUN: <5 mg/dL — ABNORMAL LOW (ref 6–20)
CO2: 21 mmol/L — ABNORMAL LOW (ref 22–32)
Calcium: 9.8 mg/dL (ref 8.9–10.3)
Chloride: 106 mmol/L (ref 98–111)
Creatinine, Ser: 0.73 mg/dL (ref 0.44–1.00)
GFR, Estimated: >60 mL/min (ref >60)
Glucose, Bld: 102 mg/dL — ABNORMAL HIGH (ref 70–99)
Potassium: 3.5 mmol/L (ref 3.5–5.1)
Sodium: 139 mmol/L (ref 135–145)
Total Bilirubin: 0.7 mg/dL (ref 0.3–1.2)
Total Protein: 7.4 g/dL (ref 6.5–8.1)

## 2023-01-04 LAB — CBC
HCT: 41.3 % (ref 36.0–46.0)
Hemoglobin: 13.7 g/dL (ref 12.0–15.0)
MCH: 28 pg (ref 26.0–34.0)
MCHC: 33.2 g/dL (ref 30.0–36.0)
MCV: 84.5 fL (ref 80.0–100.0)
Platelets: 317 10*3/uL (ref 150–400)
RBC: 4.89 MIL/uL (ref 3.87–5.11)
RDW: 12.1 % (ref 11.5–15.5)
WBC: 6.3 10*3/uL (ref 4.0–10.5)
nRBC: 0 % (ref 0.0–0.2)

## 2023-01-04 LAB — RAPID URINE DRUG SCREEN, HOSP PERFORMED
Amphetamines: NOT DETECTED
Barbiturates: NOT DETECTED
Benzodiazepines: POSITIVE — AB
Cocaine: NOT DETECTED
Opiates: NOT DETECTED
Tetrahydrocannabinol: NOT DETECTED

## 2023-01-04 LAB — HCG, SERUM, QUALITATIVE: Preg, Serum: NEGATIVE

## 2023-01-04 LAB — ACETAMINOPHEN LEVEL: Acetaminophen (Tylenol), Serum: <10 ug/mL — ABNORMAL LOW (ref 10–30)

## 2023-01-04 LAB — ETHANOL: Alcohol, Ethyl (B): <10 mg/dL (ref <10)

## 2023-01-04 LAB — SALICYLATE LEVEL: Salicylate Lvl: <7 mg/dL — ABNORMAL LOW (ref 7.0–30.0)

## 2023-01-04 MED ORDER — LORAZEPAM 1 MG PO TABS
1.0000 mg | ORAL_TABLET | ORAL | Status: AC
  Administered 2023-01-04: 1 mg via ORAL
  Filled 2023-01-04: qty 1

## 2023-01-04 MED ORDER — PAROXETINE HCL 20 MG PO TABS: 20.0000 mg | ORAL_TABLET | Freq: Every day | ORAL | 0 refills | Status: DC

## 2023-01-04 NOTE — ED Notes (Signed)
Pt given all belonging and changing back into personal clothes

## 2023-01-04 NOTE — ED Notes (Signed)
Pt wanded °

## 2023-01-04 NOTE — ED Notes (Signed)
Pt changed into scrubs and calling security to wand pt now.

## 2023-01-04 NOTE — ED Triage Notes (Signed)
Patient arrived following a panic attack and complains of her medicaton change not working for her. Paxil stopped and Effexor dose changed. Patient seen past day or two for same. No distress. NO SI or HI

## 2023-01-04 NOTE — ED Notes (Signed)
Pt provided water to drink. Cool calm with lights down in room. Advised her the plan and she acknowledged.

## 2023-01-04 NOTE — ED Notes (Addendum)
Belonging placed in locker number 1

## 2023-01-04 NOTE — ED Provider Notes (Cosign Needed Addendum)
[facility]GENCY DEPARTMENT AT [facility] Provider Note   CSN: [id number] Arrival date & time: 01/04/23  8416     History  No chief complaint on file.   Samantha Nguyen is a 37 y.o. female.  37 year old female presents today for concern of feeling anxious and just overall not feeling well.  She has significant history of depression, anxiety and states that she was recently changed from Paxil to a different medication and since then has not been feeling well.  She was dismissed by her primary psychiatrist yesterday.  She denies any complaints currently.  She took 2 doses of 0.5 mg Ativan without significant relief.  She states she would like some guidance to her medications.  The history is provided by the patient. No language interpreter was used.       Home Medications Prior to Admission medications   Medication Sig Start Date End Date Taking? Authorizing Provider  acetaminophen (TYLENOL) 325 MG tablet Take 650 mg by mouth as needed for mild pain (pain score 1-3) or moderate pain (pain score 4-6).   Yes [provider]  ALPRAZolam (XANAX) 0.5 MG tablet Take 1 tablet (0.5 mg total) by mouth 2 (two) times daily as needed for sleep or anxiety. Patient taking differently: Take 0.25-0.5 mg by mouth daily as needed for sleep or anxiety. 07/27/22 07/27/23 Yes Orson Eva, NP  venlafaxine (EFFEXOR) 37.5 MG tablet Take 75 mg by mouth daily.   Yes [provider]  hydrOXYzine (ATARAX) 25 MG tablet Take 1-2 tablets (25-50 mg total) by mouth at bedtime as needed for anxiety (insomnia). Patient not taking: Reported on 01/04/2023 12/15/22   Dulce Sellar, NP  ondansetron (ZOFRAN-ODT) 4 MG disintegrating tablet Take 1 tablet (4 mg total) by mouth every 8 (eight) hours as needed. Patient not taking: Reported on 01/04/2023 01/01/23   Smitty Knudsen, PA-C  oxyCODONE-acetaminophen (PERCOCET/ROXICET) 5-325 MG tablet Take 1 tablet by mouth every 6 (six) hours as  needed for severe pain (pain score 7-10). Patient not taking: Reported on 01/04/2023 01/01/23   Smitty Knudsen, PA-C  PARoxetine (PAXIL) 20 MG tablet Take 1 tablet (20 mg total) by mouth daily. Patient not taking: Reported on 01/04/2023 05/18/22   Bobbye Morton, MD  tamsulosin (FLOMAX) 0.4 MG CAPS capsule Take 1 capsule (0.4 mg total) by mouth at bedtime. Patient not taking: Reported on 01/04/2023 01/01/23   Smitty Knudsen, PA-C      Allergies    Sulfa antibiotics    Review of Systems   Review of Systems  Constitutional:  Negative for chills and fever.  Psychiatric/Behavioral:  Negative for agitation, behavioral problems, self-injury and sleep disturbance. The patient is nervous/anxious.   All other systems reviewed and are negative.   Physical Exam Updated Vital Signs BP (!) 144/110 (BP Location: Right Arm)   Pulse (!) 126   Temp 98.4 F (36.9 C)   Resp 18   LMP 12/22/2022   SpO2 100%  Physical Exam Vitals and nursing note reviewed.  Constitutional:      General: She is not in acute distress.    Appearance: Normal appearance. She is not ill-appearing.  HENT:     Head: Normocephalic and atraumatic.     Nose: Nose normal.  Eyes:     Conjunctiva/sclera: Conjunctivae normal.  Cardiovascular:     Rate and Rhythm: Normal rate and regular rhythm.  Pulmonary:     Effort: Pulmonary effort is normal. No respiratory distress.  Musculoskeletal:  General: No deformity.  Skin:    Findings: No rash.  Neurological:     General: No focal deficit present.     Mental Status: She is alert and oriented to person, place, and time.  Psychiatric:        Attention and Perception: Attention normal.        Mood and Affect: Mood is anxious. Affect is tearful.        Speech: Speech normal.        Behavior: Behavior is hyperactive.        Thought Content: Thought content does not include homicidal or suicidal ideation. Thought content does not include homicidal or suicidal plan.      ED Results / Procedures / Treatments   Labs (all labs ordered are listed, but only abnormal results are displayed) Labs Reviewed  COMPREHENSIVE METABOLIC PANEL - Abnormal; Notable for the following components:      Result Value   CO2 21 (*)    Glucose, Bld 102 (*)    BUN <5 (*)    All other components within normal limits  SALICYLATE LEVEL - Abnormal; Notable for the following components:   Salicylate Lvl <7.0 (*)    All other components within normal limits  ACETAMINOPHEN LEVEL - Abnormal; Notable for the following components:   Acetaminophen (Tylenol), Serum <10 (*)    All other components within normal limits  RAPID URINE DRUG SCREEN, HOSP PERFORMED - Abnormal; Notable for the following components:   Benzodiazepines POSITIVE (*)    All other components within normal limits  ETHANOL  CBC  HCG, SERUM, QUALITATIVE    EKG None  Radiology No results found.  Procedures Procedures    Medications Ordered in ED Medications - No data to display  ED Course/ Medical Decision Making/ A&P                                 Medical Decision Making Amount and/or Complexity of Data Reviewed Labs: ordered.   37 year old female presents today for concern of worsening depression and anxiety.  She has recently been changed to Effexor from Paxil.  She states she has been on Paxil for 20+ years and has done relatively following.  She has taken Ativan 0.5 mg twice this morning without significant improvement in her anxiety.  She is tearful on exam.  Recently discharged from her primary psychiatrist from what she states is due to over communication over secure chat.  CBC is unremarkable.  CMP without acute concern.  Salicylate, acetaminophen, ethanol level within normal.  Pregnancy test negative.  Benzos on UDS otherwise unremarkable.  TTS consult placed.  Communicated with behavioral health provider who will evaluate patient around 330.  Patient medically cleared for psych  eval.  Patient seen by psychiatry.  Cleared for discharge from their standpoint.  Patient medically cleared.  She is stable for discharge.  Discharged in stable condition.  Final Clinical Impression(s) / ED Diagnoses Final diagnoses:  Depression, unspecified depression type    Rx / DC Orders ED Discharge Orders     None         Marita Kansas, PA-C 01/04/23 1341    Marita [place], PA-C 01/04/23 1428    Gerhard Munch, MD 01/05/23 [phone]

## 2023-01-04 NOTE — ED Provider Triage Note (Signed)
Emergency Medicine Provider Triage Evaluation Note  Samantha Nguyen , a 37 y.o. female  was evaluated in triage.  Pt complains of anxiety.  Review of Systems  Positive: Passive suicidal ideation Negative: Plan of suicide   Physical Exam  BP (!) 144/110 (BP Location: Right Arm)   Pulse (!) 126   Temp 98.4 F (36.9 C)   Resp 18   LMP 12/22/2022   SpO2 100%  Gen:   Awake, anxious appearing Resp:  Normal effort  MSK:   Moves extremities without difficulty  Other:  Tachycardic   Medical Decision Making  Medically screening exam initiated at 8:42 AM.  Appropriate orders placed.  Samantha Nguyen was informed that the remainder of the evaluation will be completed by another provider, this initial triage assessment does not replace that evaluation, and the importance of remaining in the ED until their evaluation is complete.  Patient endorses vague SI in setting of medication changes without plan. Reports psychiatrist no longer willing to see her. Discussed patient reporting suicidal thoughts with charge RN Samantha Nguyen    Lonell Grandchild, MD 01/04/23 [phone]

## 2023-01-04 NOTE — Consult Note (Signed)
[facility] ED ASSESSMENT   Reason for Consult:  GAD Referring Physician:  Karie Mainland Patient Identification: Samantha Nguyen MRN:  [id number] ED Chief Complaint: Generalized anxiety disorder  Diagnosis:  Principal Problem:   Generalized anxiety disorder   ED Assessment Time Calculation: Start Time: 1400 Stop Time: 1445 Total Time in Minutes (Assessment Completion): 45   HPI:   Samantha Nguyen is a 37 y.o. female patient who presents to [facility] today for concerns of worsening anxiety. She recently discontinued Paxil and was Started on Effexor around 12/18/22. Pt was tapered off Paxil and is now only on Effexor. Pt feels like anxiety has worsened and requesting additional advice. She says she was dismissed by her OP provider and not helped on the matter.   Subjective:   Pt seen at [facility] for psychiatric evaluation. Pt is tearful, anxious, and reiterates information above. She has an outpatient therapist, attends PHP, and has an OP psychiatrist for medication management. She has been off and on Paxil for nearly 20 years and wanted to try a new medication. She was started on Effexor on 12/18/22 and tapered off of Paxil. Since being completely on Effexor pt states her anxiety has worsened causing her to lose her appetite, insomnia, and excessive stress. Pt states she messaged her OP provider around 20 times asking her help, but the OP provider discharged her from services due to harrassment. Pt stated "no one is helping me. No one cares about my medical trauma I almost died from sepsis one time from a doctors error so I have a lot of PTSD and trauma with doctors, and I don't know what to do." Pt denies SI/HI/AVH.   I spoke with PHP provider, Hillery Jacks, NP who recommended the Effexor be discontinued and pt can go back on Paxil if she desires.   I talked to the patient about the plan above, and she is agreeable with stopping effexor and getting back on Paxil. Pt states Paxil is one of the only medications she has tried that  she feels like helps her. Pt does still have some Paxil at home but requesting refill, will send a 14 day supply to preferred pharmacy. Instructed patient to take 10 mg for the next 7 days and if tolerating well continue at 20 mg daily.   Pt appeared to be more calm and less anxious after discussion and getting a medication plan in place. She does feel safe to discharge home from the hospital, her husband will be picking her up. She will continue to follow up with PHP this week, and has plans to find a new OP provider. PT is psych cleared for discharged.   Past Psychiatric History:  GAD, MDD  Risk to Self or Others: Is the patient at risk to self? No Has the patient been a risk to self in the past 6 months? No Has the patient been a risk to self within the distant past? Yes Is the patient a risk to others? No Has the patient been a risk to others in the past 6 months? No Has the patient been a risk to others within the distant past? No  [place] Scale:  Flowsheet Row ED from 01/04/2023 in [facility] Emergency Department at [facility] ED from 01/01/2023 in [facility] Emergency Department at [facility] ED from 12/31/2022 in [facility] Emergency Department at [facility]  C-SSRS RISK CATEGORY High Risk No Risk No Risk       AIMS:  , , ,  ,  ASAM:    Substance Abuse:     Past Medical History:  Past Medical History:  Diagnosis Date   Allergy 1985/12/18   Have had them my whole life   Anxiety    Chronic kidney disease    Costochondritis 09/02/2022   Depressed    History of kidney stones    MDD (major depressive disorder), single episode, severe (HCC) 04/28/2022   Migraine    Other chest pain 09/02/2022   Renal disorder     Past Surgical History:  Procedure Laterality Date   CESAREAN SECTION     CYSTOSCOPY W/ URETERAL STENT PLACEMENT Right 08/24/2016   Procedure: CYSTOSCOPY WITH RETROGRADE PYELOGRAM/URETERAL STENT PLACEMENT;  Surgeon: Malen Gauze, MD;  Location: [facility];  Service: Urology;  Laterality: Right;   CYSTOSCOPY WITH RETROGRADE PYELOGRAM, URETEROSCOPY AND STENT PLACEMENT Right 09/09/2016   Procedure: CYSTOSCOPY WITH RETROGRADE PYELOGRAM, URETEROSCOPY AND STENT REPLACEMENT;  Surgeon: Malen Gauze, MD;  Location: [facility];  Service: Urology;  Laterality: Right;   MANDIBLE SURGERY     Family History:  Family History  Problem Relation Age of Onset   Hypertension Mother    Ulcerative colitis Mother    Kidney disease Mother    Alcohol abuse Father    Drug abuse Father    Anxiety disorder Father    Drug abuse Brother    Kidney disease Brother    Anxiety disorder Maternal Grandfather    Cancer Maternal Grandfather    Anxiety disorder Maternal Grandmother    Kidney disease Sister    Social History:  Social History   Substance and Sexual Activity  Alcohol Use Yes   Comment: Hardly drink at all     Social History   Substance and Sexual Activity  Drug Use No    Social History   Socioeconomic History   Marital status: Single    Spouse name: Not on file   Number of children: 1   Years of education: Not on file   Highest education level: Some college, no degree  Occupational History   Not on file  Tobacco Use   Smoking status: Former    Current packs/day: 0.25    Average packs/day: 0.3 packs/day for 0.5 years (0.1 ttl pk-yrs)    Types: Cigarettes   Smokeless tobacco: Never   Tobacco comments:    I smoked off and on for a few months at a time but havent smoked in like 8 years  Vaping Use   Vaping status: Some Days   Substances: CBD  Substance and Sexual Activity   Alcohol use: Yes    Comment: Hardly drink at all   Drug use: No   Sexual activity: Not Currently    Birth control/protection: Condom  Other Topics Concern   Not on file  Social History Narrative   Not on file   Social Determinants of Health   Financial Resource Strain: Not on file  Food Insecurity: Not  on file  Transportation Needs: Not on file  Physical Activity: Not on file  Stress: Not on file  Social Connections: Not on file   Additional Social History:    Allergies:   Allergies  Allergen Reactions   Sulfa Antibiotics Other (See Comments)    Unknown, happened when she was a child    Labs:  Results for orders placed or performed during the hospital encounter of 01/04/23 (from the past 48 hour(s))  Comprehensive metabolic panel     Status: Abnormal   Collection  Time: 01/04/23  8:39 AM  Result Value Ref Range   Sodium 139 135 - 145 mmol/L   Potassium 3.5 3.5 - 5.1 mmol/L   Chloride 106 98 - 111 mmol/L   CO2 21 (L) 22 - 32 mmol/L   Glucose, Bld 102 (H) 70 - 99 mg/dL    Comment: Glucose reference range applies only to samples taken after fasting for at least 8 hours.   BUN <5 (L) 6 - 20 mg/dL   Creatinine, Ser 8.29 0.44 - 1.00 mg/dL   Calcium 9.8 8.9 - 56.2 mg/dL   Total Protein 7.4 6.5 - 8.1 g/dL   Albumin 4.2 3.5 - 5.0 g/dL   AST 21 15 - 41 U/L   ALT 19 0 - 44 U/L   Alkaline Phosphatase 51 38 - 126 U/L   Total Bilirubin 0.7 0.3 - 1.2 mg/dL   GFR, Estimated >13 >08 mL/min    Comment: (NOTE) Calculated using the CKD-EPI Creatinine Equation (2021)    Anion gap 12 5 - 15    Comment: Performed at [facility], [address] [place], [name] [address]  Ethanol     Status: None   Collection Time: 01/04/23  8:39 AM  Result Value Ref Range   Alcohol, Ethyl (B) <10 <10 mg/dL    Comment: (NOTE) Lowest detectable limit for serum alcohol is 10 mg/dL.  For medical purposes only. Performed at [facility], [address] [place], [name] [address]   Salicylate level     Status: Abnormal   Collection Time: 01/04/23  8:39 AM  Result Value Ref Range   Salicylate Lvl <7.0 (L) 7.0 - 30.0 mg/dL    Comment: Performed at [facility], [address] [place], [name] [address]  Acetaminophen level     Status: Abnormal   Collection Time: 01/04/23  8:39 AM   Result Value Ref Range   Acetaminophen (Tylenol), Serum <10 (L) 10 - 30 ug/mL    Comment: (NOTE) Therapeutic concentrations vary significantly. A range of 10-30 ug/mL  may be an effective concentration for many patients. However, some  are best treated at concentrations outside of this range. Acetaminophen concentrations >150 ug/mL at 4 hours after ingestion  and >50 ug/mL at 12 hours after ingestion are often associated with  toxic reactions.  Performed at [facility], [address] [place], [name] [address]   cbc     Status: None   Collection Time: 01/04/23  8:39 AM  Result Value Ref Range   WBC 6.3 4.0 - 10.5 K/uL   RBC 4.89 3.87 - 5.11 MIL/uL   Hemoglobin 13.7 12.0 - 15.0 g/dL   HCT 10.2 72.5 - 36.6 %   MCV 84.5 80.0 - 100.0 fL   MCH 28.0 26.0 - 34.0 pg   MCHC 33.2 30.0 - 36.0 g/dL   RDW 44.0 34.7 - 42.5 %   Platelets 317 150 - 400 K/uL   nRBC 0.0 0.0 - 0.2 %    Comment: Performed at [facility], [address], [place], [name] [address]  hCG, serum, qualitative     Status: None   Collection Time: 01/04/23  8:39 AM  Result Value Ref Range   Preg, Serum NEGATIVE NEGATIVE    Comment:        THE SENSITIVITY OF THIS METHODOLOGY IS >10 mIU/mL. Performed at [facility], [address], [place], [name] [address]   Rapid urine drug screen (hospital  performed)     Status: Abnormal   Collection Time: 01/04/23  8:58 AM  Result Value Ref Range   Opiates NONE DETECTED NONE DETECTED   Cocaine NONE DETECTED NONE DETECTED   Benzodiazepines POSITIVE (A) NONE DETECTED   Amphetamines NONE DETECTED NONE DETECTED   Tetrahydrocannabinol NONE DETECTED NONE DETECTED   Barbiturates NONE DETECTED NONE DETECTED    Comment: (NOTE) DRUG SCREEN FOR MEDICAL PURPOSES ONLY.  IF CONFIRMATION IS NEEDED FOR ANY PURPOSE, NOTIFY LAB WITHIN 5 DAYS.  LOWEST DETECTABLE LIMITS FOR URINE DRUG SCREEN Drug Class                     Cutoff (ng/mL) Amphetamine and metabolites     1000 Barbiturate and metabolites    200 Benzodiazepine                 200 Opiates and metabolites        300 Cocaine and metabolites        300 THC                            50 Performed at [facility], [address], [place], [name] [address]     Current Facility-Administered Medications  Medication Dose Route Frequency Provider Last Rate Last Admin   LORazepam (ATIVAN) tablet 1 mg  1 mg Oral NOW Eligha Bridegroom, NP       Current Outpatient Medications  Medication Sig Dispense Refill   ALPRAZolam (XANAX) 0.5 MG tablet Take 1 tablet (0.5 mg total) by mouth 2 (two) times daily as needed for sleep or anxiety. (Patient taking differently: Take 0.25-0.5 mg by mouth daily as needed for sleep or anxiety.) 10 tablet 0   PARoxetine (PAXIL) 20 MG tablet Take 1 tablet (20 mg total) by mouth daily. 14 tablet 0    Musculoskeletal: Strength & Muscle Tone: within normal limits Gait & Station: normal Patient leans: N/A   Psychiatric Specialty Exam: Presentation  General Appearance:  Appropriate for Environment  Eye Contact: Good  Speech: Clear and Coherent  Speech Volume: Normal  Handedness: Right   Mood and Affect  Mood: Anxious  Affect: Tearful; Congruent   Thought Process  Thought Processes: Coherent  Descriptions of Associations:Intact  Orientation:Full (Time, Place and Person)  Thought Content:WDL  History of Schizophrenia/Schizoaffective disorder:No  Duration of Psychotic Symptoms:No data recorded Hallucinations:Hallucinations: None  Ideas of Reference:None  Suicidal Thoughts:Suicidal Thoughts: No  Homicidal Thoughts:Homicidal Thoughts: No   Sensorium  Memory: Immediate Good; Recent Good  Judgment: Good  Insight: Good   Executive Functions  Concentration: Good  Attention Span: Good  Recall: Good  Fund of Knowledge: Good  Language: Good   Psychomotor Activity  Psychomotor Activity: Psychomotor Activity:  Normal   Assets  Assets: Desire for Improvement; Physical Health; Resilience; Social Support    Sleep  Sleep: Sleep: Good   Physical Exam: Physical Exam Neurological:     Mental Status: She is alert and oriented to person, place, and time.  Psychiatric:        Attention and Perception: Attention normal.        Mood and Affect: Mood is anxious. Affect is tearful.        Speech: Speech normal.        Behavior: Behavior is cooperative.        Thought Content: Thought content normal.    Review of Systems  Psychiatric/Behavioral:  The patient is nervous/anxious.  All other systems reviewed and are negative.  Blood pressure (!) 144/110, pulse (!) 126, temperature 98.4 F (36.9 C), resp. rate 18, last menstrual period 12/22/2022, SpO2 100%. There is no height or weight on file to calculate BMI.  Medical Decision Making: Pt case reviewed and discussed with Dr. Lucianne Muss. Pt does not meet criteria for IVC or inpatient psychiatric treatment. Medication adjustments were made advised by her outpatient team.   Problem 1: GAD - Discontinue Effexor 37.5 mg po daily - Restart Paxil at 10 mg for 7 days, and if tolerated well increase to regular dosage of  20 mg daily  - resources provided in AVS for mental health crisis and return precautions discussed with patient   Disposition: No evidence of imminent risk to self or others at present.   Patient does not meet criteria for psychiatric inpatient admission. Supportive therapy provided about ongoing stressors. Discussed crisis plan, support from social network, calling 911, coming to the Emergency Department, and calling Suicide Hotline.  Eligha Bridegroom, NP 01/04/2023 2:26 PM

## 2023-01-04 NOTE — ED Notes (Signed)
Called [facility], per verbal order of PA Karie Mainland, to locate a nurse at [facility] for whom he can give report on patient.

## 2023-01-04 NOTE — ED Notes (Signed)
Pt visibly upset and crying at bedside at this time.

## 2023-01-04 NOTE — Discharge Instructions (Signed)
Please discontinue Effexor, and take Paxil 10 mg for 7 days. Can increase to normal dose of 20 mg daily if tolerating well.   Please continue to follow up with Partial Hospitalization Program and your OP.

## 2023-01-04 NOTE — ED Notes (Signed)
Spouse Ginette Otto to pickup patient from ED lobby

## 2023-01-04 NOTE — ED Notes (Signed)
TTS with NP Effie Shy

## 2023-01-05 ENCOUNTER — Ambulatory Visit (HOSPITAL_COMMUNITY): Payer: Medicaid Other | Admitting: Licensed Clinical Social Worker

## 2023-01-05 ENCOUNTER — Ambulatory Visit (INDEPENDENT_AMBULATORY_CARE_PROVIDER_SITE_OTHER): Payer: MEDICAID | Admitting: Family

## 2023-01-05 ENCOUNTER — Other Ambulatory Visit (HOSPITAL_COMMUNITY)
Admission: RE | Admit: 2023-01-05 | Discharge: 2023-01-05 | Disposition: A | Discharge status: Home / Self Care | Payer: MEDICAID | Source: Ambulatory Visit | Attending: Family | Admitting: Family

## 2023-01-05 ENCOUNTER — Encounter: Payer: Self-pay | Admitting: Family

## 2023-01-05 VITALS — BP 128/96 | HR 103 | Temp 98.0°F | Ht 67.0 in | Wt 160.0 lb

## 2023-01-05 DIAGNOSIS — Z114 Encounter for screening for human immunodeficiency virus [HIV]: Secondary | ICD-10-CM | POA: Diagnosis not present

## 2023-01-05 DIAGNOSIS — Z0000 Encounter for general adult medical examination without abnormal findings: Secondary | ICD-10-CM | POA: Diagnosis present

## 2023-01-05 DIAGNOSIS — F4521 Hypochondriasis: Secondary | ICD-10-CM

## 2023-01-05 DIAGNOSIS — Z1159 Encounter for screening for other viral diseases: Secondary | ICD-10-CM

## 2023-01-05 DIAGNOSIS — F4310 Post-traumatic stress disorder, unspecified: Secondary | ICD-10-CM

## 2023-01-05 DIAGNOSIS — F410 Panic disorder [episodic paroxysmal anxiety] without agoraphobia: Secondary | ICD-10-CM | POA: Diagnosis not present

## 2023-01-05 DIAGNOSIS — F331 Major depressive disorder, recurrent, moderate: Secondary | ICD-10-CM | POA: Diagnosis not present

## 2023-01-05 NOTE — Patient Instructions (Signed)
It was very nice to see you today!   I will review your lab results via MyChart in a few days.       PLEASE NOTE:  If you had any lab tests please let us know if you have not heard back within a few days. You may see your results on MyChart before we have a chance to review them but we will give you a call once they are reviewed by us. If we ordered any referrals today, please let us know if you have not heard from their office within the next week.    

## 2023-01-05 NOTE — Assessment & Plan Note (Signed)
Chronic, Unstable Recent exacerbation of symptoms leading to multiple ER visits. Currently in a partial hospitalization program. Recent medication changes have been challenging and caused distress. Complete work accommodation paperwork as requested by the patient. Plan for a medication review and potential adjustment next week with the program mental health provider. will continue to follow

## 2023-01-05 NOTE — Progress Notes (Signed)
Phone [phone]  Subjective:   Patient is a 37 y.o. female presenting for annual physical.    Chief Complaint  Patient presents with   Annual Exam    Non fasting w/ labs    *Discussed the use of AI scribe software for clinical note transcription with the patient, who gave verbal consent to proceed.  History of Present Illness   The patient, with a history of anxiety and panic attacks, presents for a routine check-up. She reports a history of an abnormal Pap smear following the birth of her son 12 years ago, but believes the most recent one was normal. She is unsure of her HPV status.  The patient has been experiencing issues with her psychiatric medications, leading to multiple panic attacks and two recent ER visits. She reports feeling ignored by her previous psychiatrist, whom she says also discharged her from her care. She is currently in a partial hospitalization program thru behavioral health and is undergoing a medication washout period after a recent change from Paxil to Effexor led to increased anxiety. The patient is seeking assistance with paperwork for work accommodation due to her ongoing medical issues and medication changes.   In addition to her psychiatric concerns, the patient reports a recent diagnosis of a kidney stone. She also notes that her menstrual periods have become heavier and longer over the past year and a half, although they are not excessively heavy.     See problem oriented charting- ROS- full  review of systems was completed and negative except for Anxiety noted in HPI above.  The following were reviewed and entered/updated in epic: Past Medical History:  Diagnosis Date   Allergy [birth year]1985   Have had them my whole life   Anxiety    Chronic kidney disease    Costochondritis 09/02/2022   Depressed    History of kidney stones    MDD (major depressive disorder), single episode, severe (HCC) 04/28/2022   Migraine    Other chest pain 09/02/2022    Renal disorder    Patient Active Problem List   Diagnosis Date Noted   Gastroesophageal reflux disease without esophagitis 09/02/2022   Mixed hyperlipidemia 09/02/2022   Elevated blood pressure reading 07/27/2022   Tension-type headache, not intractable 07/27/2022   MDD (major depressive disorder), recurrent episode, moderate (HCC) 05/02/2022   Generalized anxiety disorder 05/02/2022   Illness anxiety disorder 04/28/2022   Sepsis due to urinary tract infection (HCC) 08/24/2016   Past Surgical History:  Procedure Laterality Date   CESAREAN SECTION     CYSTOSCOPY W/ URETERAL STENT PLACEMENT Right 08/24/2016   Procedure: CYSTOSCOPY WITH RETROGRADE PYELOGRAM/URETERAL STENT PLACEMENT;  Surgeon: Malen Gauze, MD;  Location: [facility];  Service: Urology;  Laterality: Right;   CYSTOSCOPY WITH RETROGRADE PYELOGRAM, URETEROSCOPY AND STENT PLACEMENT Right 09/09/2016   Procedure: CYSTOSCOPY WITH RETROGRADE PYELOGRAM, URETEROSCOPY AND STENT REPLACEMENT;  Surgeon: Malen Gauze, MD;  Location: [facility];  Service: Urology;  Laterality: Right;   MANDIBLE SURGERY      Family History  Problem Relation Age of Onset   Hypertension Mother    Ulcerative colitis Mother    Kidney disease Mother    Alcohol abuse Father    Drug abuse Father    Anxiety disorder Father    Drug abuse Brother    Kidney disease Brother    Anxiety disorder Maternal Grandfather    Cancer Maternal Grandfather    Anxiety disorder Maternal Grandmother    Kidney disease Sister  Medications- reviewed and updated Current Outpatient Medications  Medication Sig Dispense Refill   ALPRAZolam (XANAX) 0.5 MG tablet Take 1 tablet (0.5 mg total) by mouth 2 (two) times daily as needed for sleep or anxiety. (Patient taking differently: Take 0.25-0.5 mg by mouth daily as needed for sleep or anxiety.) 10 tablet 0   PARoxetine (PAXIL) 20 MG tablet Take 1 tablet (20 mg total) by mouth daily. (Patient not taking:  Reported on 01/05/2023) 14 tablet 0   No current facility-administered medications for this visit.    Allergies-reviewed and updated Allergies  Allergen Reactions   Sulfa Antibiotics Other (See Comments)    Unknown, happened when she was a child    Social History   Social History Narrative   Not on file    Objective:  BP (!) 128/96 (BP Location: Left Arm, Patient Position: Sitting, Cuff Size: Normal)   Pulse (!) 103   Temp 98 F (36.7 C) (Temporal)   Ht 5\' 7"  (1.702 m)   Wt 160 lb (72.6 kg)   LMP 12/20/2022 (Exact Date)   SpO2 100%   BMI 25.06 kg/m  Physical Exam Vitals and nursing note reviewed. Exam conducted with a chaperone present.  Constitutional:      Appearance: Normal appearance.  HENT:     Head: Normocephalic.     Right Ear: Tympanic membrane normal.     Left Ear: Tympanic membrane normal.     Nose: Nose normal.     Mouth/Throat:     Mouth: Mucous membranes are moist.  Eyes:     Pupils: Pupils are equal, round, and reactive to light.  Cardiovascular:     Rate and Rhythm: Normal rate and regular rhythm.  Pulmonary:     Effort: Pulmonary effort is normal.     Breath sounds: Normal breath sounds.  Chest:  Breasts:    Tanner Score is 5.     Breasts are symmetrical.     Right: Normal.     Left: Normal.  Genitourinary:    Exam position: Lithotomy position.     Pubic Area: No rash or pubic lice.      Labia:        Right: No rash.        Left: No rash.      Vagina: Normal.     Cervix: Normal.     Comments: PAP smear specimen obtained. Musculoskeletal:        General: Normal range of motion.     Cervical back: Normal range of motion.  Lymphadenopathy:     Cervical: No cervical adenopathy.     Upper Body:     Right upper body: No supraclavicular, axillary or pectoral adenopathy.     Left upper body: No supraclavicular, axillary or pectoral adenopathy.  Skin:    General: Skin is warm and dry.  Neurological:     Mental Status: She is alert.   Psychiatric:        Mood and Affect: Mood normal.        Behavior: Behavior normal.      Assessment and Plan   Health Maintenance counseling: 1. Anticipatory guidance: Patient counseled regarding regular dental exams q6 months, eye exams,  avoiding smoking and second hand smoke, limiting alcohol to 1 beverage per day, no illicit drugs.   2. Risk factor reduction:  Advised patient of need for regular exercise and diet rich with fruits and vegetables to reduce risk of heart attack and stroke. Wt Readings from Last 3 Encounters:  01/05/23 160 lb (72.6 kg)  01/01/23 160 lb 0.9 oz (72.6 kg)  12/31/22 160 lb (72.6 kg)   3. Immunizations/screenings/ancillary studies Immunization History  Administered Date(s) Administered   Influenza,inj,Quad PF,6+ Mos 11/06/2018   Tdap 12/01/2010   Health Maintenance Due  Topic Date Due   HIV Screening  Never done    4. Cervical cancer screening: performing today 5. Skin cancer screening- advised regular sunscreen use. Denies worrisome, changing, or new skin lesions.  6. Birth control/STD check: no BC, checking for STDs today. 7. Smoking associated screening: non- smoker 8. Alcohol screening: rare   Anxiety and Panic Attacks - Recent exacerbation of symptoms leading to multiple ER visits. Currently in a partial hospitalization program. Recent medication changes have been challenging and caused distress. -Continue with the partial hospitalization program. -Complete work accommodation paperwork as requested by the patient. -Plan for a medication review and potential adjustment next week with the program psychiatrist.  Menstrual Changes - Reports of heavier and longer periods for the past 1.5-2 years. No other associated symptoms. She reports going through 2 menstrual panties per day. Reassured pt this is normal bleeding.  -Continue monitoring menstrual changes. -Consider birth control pills or progesterone for cycle control if symptoms  worsen.  Annual physical -  pt seen in ER yesterday and had some blood work done, will perform other labs not done today. -Complete CBC with differential. -Check cholesterol, HIV, and Hepatitis C. -Perform Pap smear and HPV testing today. -Check for STDs. -Recommend dermatology for a full body skin check due to fair skin and presence of a moles, pt to let me know if referral is needed.      Recommended follow up: No follow-ups on file. Future Appointments  Date Time Provider Department Center  01/06/2023  9:00 AM [facility] THERAPIST [facility] None  01/07/2023  9:00 AM [facility] THERAPIST [facility] None  01/10/2023  9:00 AM [facility] THERAPIST [facility] None  01/11/2023  9:00 AM [facility] THERAPIST [facility] None  01/12/2023  9:00 AM [facility] THERAPIST [facility] None  01/13/2023  9:00 AM [facility] THERAPIST [facility] None  01/14/2023  9:00 AM [facility] THERAPIST [facility] None  01/17/2023  9:00 AM [facility] THERAPIST [facility] None  01/18/2023  9:00 AM [facility] THERAPIST [facility] None  01/19/2023  9:00 AM [facility] THERAPIST [facility] None  01/20/2023  9:00 AM [facility] THERAPIST [facility] None  01/21/2023  9:00 AM [facility] THERAPIST [facility] None    Lab/Order associations:  non-fasting    Dulce Sellar, NP
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
## 2023-01-06 ENCOUNTER — Ambulatory Visit (HOSPITAL_COMMUNITY): Payer: MEDICAID

## 2023-01-06 ENCOUNTER — Inpatient Hospital Stay
Admission: AD | Admit: 2023-01-06 | Discharge: 2023-01-08 | DRG: 885 | Disposition: A | Discharge status: Home / Self Care | Payer: MEDICAID | Source: Intra-hospital | Attending: Psychiatry | Admitting: Psychiatry

## 2023-01-06 ENCOUNTER — Encounter: Payer: Self-pay | Admitting: Family

## 2023-01-06 ENCOUNTER — Other Ambulatory Visit: Payer: Self-pay

## 2023-01-06 ENCOUNTER — Ambulatory Visit (HOSPITAL_COMMUNITY)
Admission: EM | Admit: 2023-01-06 | Discharge: 2023-01-06 | Disposition: A | Discharge status: Other Acute Inpatient Hospital | Payer: MEDICAID | Attending: Psychiatry | Admitting: Psychiatry

## 2023-01-06 DIAGNOSIS — Z811 Family history of alcohol abuse and dependence: Secondary | ICD-10-CM

## 2023-01-06 DIAGNOSIS — Z841 Family history of disorders of kidney and ureter: Secondary | ICD-10-CM

## 2023-01-06 DIAGNOSIS — F411 Generalized anxiety disorder: Secondary | ICD-10-CM | POA: Insufficient documentation

## 2023-01-06 DIAGNOSIS — F332 Major depressive disorder, recurrent severe without psychotic features: Secondary | ICD-10-CM | POA: Diagnosis present

## 2023-01-06 DIAGNOSIS — Z818 Family history of other mental and behavioral disorders: Secondary | ICD-10-CM | POA: Diagnosis not present

## 2023-01-06 DIAGNOSIS — F17290 Nicotine dependence, other tobacco product, uncomplicated: Secondary | ICD-10-CM | POA: Diagnosis present

## 2023-01-06 DIAGNOSIS — F4310 Post-traumatic stress disorder, unspecified: Secondary | ICD-10-CM | POA: Diagnosis present

## 2023-01-06 DIAGNOSIS — F331 Major depressive disorder, recurrent, moderate: Secondary | ICD-10-CM

## 2023-01-06 DIAGNOSIS — Z8249 Family history of ischemic heart disease and other diseases of the circulatory system: Secondary | ICD-10-CM

## 2023-01-06 DIAGNOSIS — F410 Panic disorder [episodic paroxysmal anxiety] without agoraphobia: Secondary | ICD-10-CM | POA: Insufficient documentation

## 2023-01-06 DIAGNOSIS — F4521 Hypochondriasis: Secondary | ICD-10-CM

## 2023-01-06 DIAGNOSIS — Z813 Family history of other psychoactive substance abuse and dependence: Secondary | ICD-10-CM

## 2023-01-06 DIAGNOSIS — F17210 Nicotine dependence, cigarettes, uncomplicated: Secondary | ICD-10-CM | POA: Diagnosis present

## 2023-01-06 HISTORY — DX: Major depressive disorder, recurrent severe without psychotic features: F33.2

## 2023-01-06 LAB — CBC WITH DIFFERENTIAL/PLATELET
Basophils Absolute: 0 10*3/uL (ref 0.0–0.1)
Basophils Relative: 0.5 % (ref 0.0–3.0)
Eosinophils Absolute: 0.2 10*3/uL (ref 0.0–0.7)
Eosinophils Relative: 2.2 % (ref 0.0–5.0)
HCT: 42.3 % (ref 36.0–46.0)
Hemoglobin: 13.7 g/dL (ref 12.0–15.0)
Lymphocytes Relative: 31.8 % (ref 12.0–46.0)
Lymphs Abs: 2.5 10*3/uL (ref 0.7–4.0)
MCHC: 32.5 g/dL (ref 30.0–36.0)
MCV: 88.2 fL (ref 78.0–100.0)
Monocytes Absolute: 0.6 10*3/uL (ref 0.1–1.0)
Monocytes Relative: 7.4 % (ref 3.0–12.0)
Neutro Abs: 4.6 10*3/uL (ref 1.4–7.7)
Neutrophils Relative %: 58.1 % (ref 43.0–77.0)
Platelets: 323 10*3/uL (ref 150.0–400.0)
RBC: 4.79 Mil/uL (ref 3.87–5.11)
RDW: 12.4 % (ref 11.5–15.5)
WBC: 7.9 10*3/uL (ref 4.0–10.5)

## 2023-01-06 LAB — LIPID PANEL
Cholesterol: 196 mg/dL (ref 0–200)
HDL: 52.7 mg/dL (ref >39.00)
LDL Cholesterol: 124 mg/dL — ABNORMAL HIGH (ref 0–99)
NonHDL: 143.51
Total CHOL/HDL Ratio: 4
Triglycerides: 96 mg/dL (ref 0.0–149.0)
VLDL: 19.2 mg/dL (ref 0.0–40.0)

## 2023-01-06 LAB — HIV ANTIBODY (ROUTINE TESTING W REFLEX): HIV 1&2 Ab, 4th Generation: NONREACTIVE

## 2023-01-06 LAB — HEPATITIS C ANTIBODY: Hepatitis C Ab: NONREACTIVE

## 2023-01-06 MED ORDER — MAGNESIUM HYDROXIDE 400 MG/5ML PO SUSP
30.0000 mL | Freq: Every day | ORAL | Status: DC | PRN
Start: 2023-01-06 — End: 2023-01-08

## 2023-01-06 MED ORDER — HYDROXYZINE HCL 50 MG PO TABS
50.0000 mg | ORAL_TABLET | Freq: Three times a day (TID) | ORAL | Status: DC | PRN
  Administered 2023-01-06: 50 mg via ORAL
  Filled 2023-01-06: qty 1

## 2023-01-06 MED ORDER — ACETAMINOPHEN 325 MG PO TABS
650.0000 mg | ORAL_TABLET | Freq: Four times a day (QID) | ORAL | Status: DC | PRN
  Administered 2023-01-06: 650 mg via ORAL
  Filled 2023-01-06: qty 2

## 2023-01-06 MED ORDER — ACETAMINOPHEN 325 MG PO TABS: 650.0000 mg | ORAL_TABLET | Freq: Four times a day (QID) | ORAL | Status: DC | PRN

## 2023-01-06 MED ORDER — MAGNESIUM HYDROXIDE 400 MG/5ML PO SUSP: 30.0000 mL | Freq: Every day | ORAL | Status: DC | PRN

## 2023-01-06 MED ORDER — DIPHENHYDRAMINE HCL 25 MG PO CAPS: 50.0000 mg | ORAL_CAPSULE | Freq: Three times a day (TID) | ORAL | Status: DC | PRN

## 2023-01-06 MED ORDER — LORAZEPAM 2 MG PO TABS
2.0000 mg | ORAL_TABLET | Freq: Three times a day (TID) | ORAL | Status: DC | PRN
  Administered 2023-01-06: 2 mg via ORAL
  Filled 2023-01-06: qty 1

## 2023-01-06 MED ORDER — ALUM & MAG HYDROXIDE-SIMETH 200-200-20 MG/5ML PO SUSP: 30.0000 mL | ORAL | Status: DC | PRN

## 2023-01-06 MED ORDER — DIPHENHYDRAMINE HCL 50 MG/ML IJ SOLN: 50.0000 mg | Freq: Three times a day (TID) | INTRAMUSCULAR | Status: DC | PRN

## 2023-01-06 MED ORDER — LORAZEPAM 2 MG/ML IJ SOLN: 2.0000 mg | Freq: Three times a day (TID) | INTRAMUSCULAR | Status: DC | PRN

## 2023-01-06 MED ORDER — HALOPERIDOL LACTATE 5 MG/ML IJ SOLN: 5.0000 mg | Freq: Three times a day (TID) | INTRAMUSCULAR | Status: DC | PRN

## 2023-01-06 MED ORDER — DOXYLAMINE SUCCINATE (SLEEP) 25 MG PO TABS
25.0000 mg | ORAL_TABLET | Freq: Every evening | ORAL | Status: DC | PRN
  Administered 2023-01-06: 25 mg via ORAL
  Filled 2023-01-06 (×2): qty 1

## 2023-01-06 MED ORDER — HALOPERIDOL 5 MG PO TABS: 5.0000 mg | ORAL_TABLET | Freq: Three times a day (TID) | ORAL | Status: DC | PRN
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
## 2023-01-06 NOTE — Group Note (Signed)
Date:  01/06/2023 Time:  8:59 PM  Group Topic/Focus:  Goals Group:   The focus of this group is to help patients establish daily goals to achieve during treatment and discuss how the patient can incorporate goal setting into their daily lives to aide in recovery.    Participation Level:  Active  Participation Quality:  Appropriate, Attentive, Sharing, and Supportive  Affect:  Appropriate  Cognitive:  Appropriate  Insight: Appropriate and Good  Engagement in Group:  Supportive  Modes of Intervention:  Support  Additional Comments:     Belva Crome 01/06/2023, 8:59 PM

## 2023-01-06 NOTE — Progress Notes (Signed)
   01/06/23 1128  BHUC Triage Screening (Walk-ins at [facility] only)  How Did You Hear About [name]? Self  What Is the Reason for Your Visit/Call Today? Anxiety, panic attacks  How Long Has This Been Causing You Problems? 1 wk - 1 month  Have You Recently Had Any Thoughts About Hurting Yourself? No  Are You Planning to Commit Suicide/Harm Yourself At This time? No  Have you Recently Had Thoughts About Hurting Someone Karolee Ohs? No  Are You Planning To Harm Someone At This Time? No  Are you currently experiencing any auditory, visual or other hallucinations? No  Have You Used Any Alcohol or Drugs in the Past 24 Hours? No  Do you have any current medical co-morbidities that require immediate attention? No  Clinician description of patient physical appearance/behavior: tearful, anxious  What Do You Feel Would Help You the Most Today? Treatment for Depression or other mood problem;Medication(s)  If access to [facility] was not available, would you have sought care in the Emergency Department? No  Determination of Need Urgent (48 hours)  Options For Referral Medication Management;Outpatient Therapy;Inpatient Hospitalization

## 2023-01-06 NOTE — Progress Notes (Signed)
37 year old female patient admitted with major depressive disorder. Patient appears anxious and tearful but cooperative with admission assessment. Patient states " I have health anxiety.I was wrongly diagnosed for UTI few years ago. I have PTSD with the doctors." Patient states her goal is " feel better. My anxiety under control and able to function as I am." Patient works as an Wellsite geologist. Lives with her husband. Patient verbalized financial stress because she is giving child support to her ex husband. Patient denies SI,HI and AVH. Oriented to unit and made comfortable in room. Support and encouragement given.

## 2023-01-06 NOTE — ED Notes (Signed)
Patient discharged in no acute distress. Patient escorted to the back [facility] by staff for transport to [facility] [employer] by [employer]. Patient's belongings returned. Report was given to Gerrit Halls, RN.
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
## 2023-01-06 NOTE — Progress Notes (Signed)
Pt was accepted to [facility]  01/06/2023 ; Bed Assignment 319  Address: [address] [place], [place], [name] [address]  BMU FAX Number [phone]  Pt meets inpatient criteria per Alona Bene, PMHNP   Attending Physician will be Dr. Shellee Milo  Report can be called to: [phone]  Pt can arrive after 12:30 pm   Care Team notified: Felipa Eth MD, Malva Limes AC/RN, Horton Marshall RN, Latty LSCW, Gerrit Halls RN, Benard Halsted NP.   [place] Joseantonio Dittmar LCSW-A  01/06/2023 11:09 AM

## 2023-01-06 NOTE — Progress Notes (Signed)
Pt agitated and anxious. Pt stated she doesn't want benadryl since she already took her sleep medication tonight. PRN anxiety medication given

## 2023-01-07 ENCOUNTER — Ambulatory Visit (HOSPITAL_COMMUNITY): Payer: MEDICAID | Admitting: Licensed Clinical Social Worker

## 2023-01-07 ENCOUNTER — Encounter (HOSPITAL_COMMUNITY): Payer: Self-pay | Admitting: Licensed Clinical Social Worker

## 2023-01-07 DIAGNOSIS — F331 Major depressive disorder, recurrent, moderate: Secondary | ICD-10-CM

## 2023-01-07 DIAGNOSIS — F332 Major depressive disorder, recurrent severe without psychotic features: Secondary | ICD-10-CM | POA: Diagnosis not present

## 2023-01-07 LAB — TSH: TSH: 1.396 u[IU]/mL (ref 0.350–4.500)

## 2023-01-07 LAB — LIPID PANEL
Cholesterol: 191 mg/dL (ref 0–200)
HDL: 45 mg/dL (ref >40)
LDL Cholesterol: 133 mg/dL — ABNORMAL HIGH (ref 0–99)
Total CHOL/HDL Ratio: 4.2 {ratio}
Triglycerides: 65 mg/dL (ref <150)
VLDL: 13 mg/dL (ref 0–40)

## 2023-01-07 MED ORDER — PAROXETINE HCL 20 MG PO TABS
20.0000 mg | ORAL_TABLET | Freq: Every day | ORAL | Status: DC
  Administered 2023-01-07 – 2023-01-08 (×2): 20 mg via ORAL
  Filled 2023-01-07 (×2): qty 1

## 2023-01-07 MED ORDER — ALPRAZOLAM 0.5 MG PO TABS
1.0000 mg | ORAL_TABLET | Freq: Three times a day (TID) | ORAL | Status: DC | PRN
  Administered 2023-01-07 (×2): 1 mg via ORAL
  Filled 2023-01-07 (×2): qty 2

## 2023-01-07 MED ORDER — QUETIAPINE FUMARATE 25 MG PO TABS: 50.0000 mg | ORAL_TABLET | Freq: Every evening | ORAL | Status: DC | PRN

## 2023-01-07 MED ORDER — RISPERIDONE 1 MG PO TABS
0.5000 mg | ORAL_TABLET | ORAL | Status: DC
  Filled 2023-01-07: qty 1

## 2023-01-07 NOTE — Plan of Care (Signed)
Pt new to the unit today, hasn't had time to progress  Problem: Education: Goal: Knowledge of [facility] Education information/materials will improve Outcome: Not Progressing Goal: Emotional status will improve Outcome: Not Progressing Goal: Mental status will improve Outcome: Not Progressing Goal: Verbalization of understanding the information provided will improve Outcome: Not Progressing   Problem: Health Behavior/Discharge Planning: Goal: Identification of resources available to assist in meeting health care needs will improve Outcome: Not Progressing Goal: Compliance with treatment plan for underlying cause of condition will improve Outcome: Not Progressing   Problem: Education: Goal: Ability to state activities that reduce stress will improve Outcome: Not Progressing   Problem: Coping: Goal: Ability to identify and develop effective coping behavior will improve Outcome: Not Progressing   Problem: Self-Concept: Goal: Ability to identify factors that promote anxiety will improve Outcome: Not Progressing Goal: Level of anxiety will decrease Outcome: Not Progressing Goal: Ability to modify response to factors that promote anxiety will improve Outcome: Not Progressing

## 2023-01-07 NOTE — Group Note (Signed)
Date:  01/07/2023 Time:  5:41 PM  Group Topic/Focus:  Activity Group:  The focus of the group is to promote activity and encourage the patients to go outside to the courtyard and get some fresh air and some exercise.    Participation Level:  Active  Participation Quality:  Appropriate  Affect:  Appropriate  Cognitive:  Appropriate  Insight: Appropriate  Engagement in Group:  Engaged  Modes of Intervention:  Activity  Additional Comments:    Samantha Nguyen 01/07/2023, 5:41 PM

## 2023-01-07 NOTE — BH IP Treatment Plan (Signed)
Interdisciplinary Treatment and Diagnostic Plan Update  01/07/2023 Time of Session: 9:24 AM  Su Mellas MRN: [id number]  Principal Diagnosis: MDD (major depressive disorder), recurrent episode, severe (HCC)  Secondary Diagnoses: Principal Problem:   MDD (major depressive disorder), recurrent episode, severe (HCC)   Current Medications:  Current Facility-Administered Medications  Medication Dose Route Frequency Provider Last Rate Last Admin   acetaminophen (TYLENOL) tablet 650 mg  650 mg Oral Q6H PRN Maryagnes Amos, FNP   650 mg at 01/06/23 1854   alum & mag hydroxide-simeth (MAALOX/MYLANTA) 200-200-20 MG/5ML suspension 30 mL  30 mL Oral Q4H PRN Starkes-Perry, Juel Burrow, FNP       diphenhydrAMINE (BENADRYL) capsule 50 mg  50 mg Oral TID PRN Maryagnes Amos, FNP       Or   diphenhydrAMINE (BENADRYL) injection 50 mg  50 mg Intramuscular TID PRN Maryagnes Amos, FNP       doxylamine (Sleep) (UNISOM) tablet 25 mg  25 mg Oral QHS PRN Sarina Ill, DO   25 mg at 01/06/23 2210   haloperidol (HALDOL) tablet 5 mg  5 mg Oral TID PRN Maryagnes Amos, FNP       Or   haloperidol lactate (HALDOL) injection 5 mg  5 mg Intramuscular TID PRN Maryagnes Amos, FNP       hydrOXYzine (ATARAX) tablet 50 mg  50 mg Oral Q8H PRN Charm Rings, NP   50 mg at 01/06/23 1719   LORazepam (ATIVAN) tablet 2 mg  2 mg Oral TID PRN Maryagnes Amos, FNP   2 mg at 01/06/23 2324   Or   LORazepam (ATIVAN) injection 2 mg  2 mg Intramuscular TID PRN Starkes-Perry, Juel Burrow, FNP       magnesium hydroxide (MILK OF MAGNESIA) suspension 30 mL  30 mL Oral Daily PRN Starkes-Perry, Juel Burrow, FNP       PTA Medications: Medications Prior to Admission  Medication Sig Dispense Refill Last Dose   ALPRAZolam (XANAX) 0.5 MG tablet Take 1 tablet (0.5 mg total) by mouth 2 (two) times daily as needed for sleep or anxiety. (Patient taking differently: Take 0.25-0.5 mg by mouth daily as  needed for sleep or anxiety.) 10 tablet 0    PARoxetine (PAXIL) 20 MG tablet Take 1 tablet (20 mg total) by mouth daily. 14 tablet 0     Patient Stressors:    Patient Strengths:    Treatment Modalities: Medication Management, Group therapy, Case management,  1 to 1 session with clinician, Psychoeducation, Recreational therapy.   Physician Treatment Plan for Primary Diagnosis: MDD (major depressive disorder), recurrent episode, severe (HCC) Long Term Goal(s):     Short Term Goals:    Medication Management: Evaluate patient's response, side effects, and tolerance of medication regimen.  Therapeutic Interventions: 1 to 1 sessions, Unit Group sessions and Medication administration.  Evaluation of Outcomes: Not Progressing  Physician Treatment Plan for Secondary Diagnosis: Principal Problem:   MDD (major depressive disorder), recurrent episode, severe (HCC)  Long Term Goal(s):     Short Term Goals:       Medication Management: Evaluate patient's response, side effects, and tolerance of medication regimen.  Therapeutic Interventions: 1 to 1 sessions, Unit Group sessions and Medication administration.  Evaluation of Outcomes: Not Progressing   RN Treatment Plan for Primary Diagnosis: MDD (major depressive disorder), recurrent episode, severe (HCC) Long Term Goal(s): Knowledge of disease and therapeutic regimen to maintain health will improve  Short Term Goals: Ability to remain free  from injury will improve, Ability to verbalize frustration and anger appropriately will improve, Ability to demonstrate self-control, Ability to participate in decision making will improve, Ability to verbalize feelings will improve, Ability to disclose and discuss suicidal ideas, Ability to identify and develop effective coping behaviors will improve, and Compliance with prescribed medications will improve  Medication Management: RN will administer medications as ordered by provider, will assess and  evaluate patient's response and provide education to patient for prescribed medication. RN will report any adverse and/or side effects to prescribing provider.  Therapeutic Interventions: 1 on 1 counseling sessions, Psychoeducation, Medication administration, Evaluate responses to treatment, Monitor vital signs and CBGs as ordered, Perform/monitor CIWA, COWS, AIMS and Fall Risk screenings as ordered, Perform wound care treatments as ordered.  Evaluation of Outcomes: Not Progressing   LCSW Treatment Plan for Primary Diagnosis: MDD (major depressive disorder), recurrent episode, severe (HCC) Long Term Goal(s): Safe transition to appropriate next level of care at discharge, Engage patient in therapeutic group addressing interpersonal concerns.  Short Term Goals: Engage patient in aftercare planning with referrals and resources, Increase social support, Increase ability to appropriately verbalize feelings, Increase emotional regulation, Facilitate acceptance of mental health diagnosis and concerns, Facilitate patient progression through stages of change regarding substance use diagnoses and concerns, Identify triggers associated with mental health/substance abuse issues, and Increase skills for wellness and recovery  Therapeutic Interventions: Assess for all discharge needs, 1 to 1 time with Social worker, Explore available resources and support systems, Assess for adequacy in community support network, Educate family and significant other(s) on suicide prevention, Complete Psychosocial Assessment, Interpersonal group therapy.  Evaluation of Outcomes: Not Progressing   Progress in Treatment: Attending groups: Yes. Participating in groups: Yes. Taking medication as prescribed: Yes. Toleration medication: Yes. Family/Significant other contact made: No, will contact:  CSW will contact if given permission  Patient understands diagnosis: Yes. Discussing patient identified problems/goals with staff:  Yes. Medical problems stabilized or resolved: Yes. Denies suicidal/homicidal ideation: No. Issues/concerns per patient self-inventory: No. Other: None   New problem(s) identified: No, Describe:  None identified   New Short Term/Long Term Goal(s): elimination of symptoms of psychosis, medication management for mood stabilization; elimination of SI thoughts; development of comprehensive mental wellness plan.   Patient Goals:  " I just want to go home. I want to get back on a medicine that doesn't make me feel crazy"  Discharge Plan or Barriers: CSW will assist with appropriate discharge planning   Reason for Continuation of Hospitalization: Anxiety Medication stabilization  Estimated Length of Stay: 1 to 7 days  Last 3 [place] Suicide Severity Risk Score: Flowsheet Row Admission (Current) from 01/06/2023 in [facility] Most recent reading at 01/06/2023  4:42 PM ED from 01/06/2023 in [facility] Most recent reading at 01/06/2023 11:29 AM ED from 01/04/2023 in [facility] Department at [facility] Most recent reading at 01/04/2023  9:23 AM  C-SSRS RISK CATEGORY No Risk No Risk High Risk       Last PHQ 2/9 Scores:    01/05/2023    3:16 PM 12/30/2022    1:39 PM 12/15/2022   10:57 AM  Depression screen PHQ 2/9  Decreased Interest 3 3 3   Down, Depressed, Hopeless 3 3 3   PHQ - 2 Score 6 6 6   Altered sleeping 3 3 3   Tired, decreased energy 3 3 3   Change in appetite 3 3 3   Feeling bad or failure about yourself  3 3 3  Trouble concentrating 1 2 1   Moving slowly or fidgety/restless 0 0 1  Suicidal thoughts 2 1 0  PHQ-9 Score 21 21 20   Difficult doing work/chores Extremely dIfficult Extremely dIfficult Extremely dIfficult    Scribe for Treatment Team: Elza Rafter, Theresia Majors 01/07/2023 10:42 AM

## 2023-01-07 NOTE — H&P (Signed)
Psychiatric Admission Assessment Adult  Patient Identification: Samantha Nguyen MRN:  [id number] Date of Evaluation:  01/07/2023 Chief Complaint:  MDD (major depressive disorder), recurrent episode, severe (HCC) [F33.2] Principal Diagnosis: MDD (major depressive disorder), recurrent episode, severe (HCC) Diagnosis:  Principal Problem:   MDD (major depressive disorder), recurrent episode, severe (HCC)  History of Present Illness: Samantha Nguyen is a 37 year old white female who is voluntarily admitted to inpatient psychiatry for worsening depression, extreme anxiety and panic attacks.  She denies suicidal ideation but her level of anxiety is pretty high.  She is not sleeping very well.  She ended up in the hospital a few years ago with sepsis due to kidney stones and had to have surgery which was very traumatizing to her and she recently got sick and this reminded her of that time.  Also, her medications were changed from Paxil to Effexor after her recent illness which has contributed to her extreme anxiety and insomnia.  I also believe that she is depressed but she does not complain of that and denies any suicidal ideation.  She is currently in the partial hospitalization program at [facility] through [employer].  She says that she did best on Paxil and I told her I will switch back.  She also takes Xanax 0.5 up to 3 times a day but states it is not working.  She currently lives in the [place] with her husband.  She is tearful throughout the interview but asked to go home, so her insight is questionable.  Associated Signs/Symptoms: Depression Symptoms:  depressed mood, anhedonia, insomnia, anxiety, panic attacks, (Hypo) Manic Symptoms:   No Anxiety Symptoms:  Excessive Worry, Panic Symptoms, Social Anxiety, Psychotic Symptoms:   No PTSD Symptoms: Had a traumatic exposure:  See above Total Time spent with patient: 1 hour  Past Psychiatric History: She has never been psychiatrically hospitalized but is  currently in the partial hospitalization program.  Is the patient at risk to self? Yes.    Has the patient been a risk to self in the past 6 months? Yes.    Has the patient been a risk to self within the distant past? Yes.    Is the patient a risk to others? No.  Has the patient been a risk to others in the past 6 months? No.  Has the patient been a risk to others within the distant past? No.   [place] Scale:  Flowsheet Row Admission (Current) from 01/06/2023 in [facility] INPATIENT BEHAVIORAL MEDICINE Most recent reading at 01/06/2023  4:42 PM ED from 01/06/2023 in [facility] Most recent reading at 01/06/2023 11:29 AM ED from 01/04/2023 in [facility] Emergency Department at [facility] Most recent reading at 01/04/2023  9:23 AM  C-SSRS RISK CATEGORY No Risk No Risk High Risk        Prior Inpatient Therapy: No. If yes, describe  Prior Outpatient Therapy: Yes.   If yes, describe see above  Alcohol Screening: 1. How often do you have a drink containing alcohol?: Monthly or less 2. How many drinks containing alcohol do you have on a typical day when you are drinking?: 1 or 2 3. How often do you have six or more drinks on one occasion?: Never AUDIT-C Score: 1 4. How often during the last year have you found that you were not able to stop drinking once you had started?: Never 5. How often during the last year have you failed to do what was normally expected from you because of drinking?:  Never 6. How often during the last year have you needed a first drink in the morning to get yourself going after a heavy drinking session?: Never 7. How often during the last year have you had a feeling of guilt of remorse after drinking?: Never 8. How often during the last year have you been unable to remember what happened the night before because you had been drinking?: Never 9. Have you or someone else been injured as a result of your drinking?: No 10. Has a relative  or friend or a doctor or another health worker been concerned about your drinking or suggested you cut down?: No Alcohol Use Disorder Identification Test Final Score (AUDIT): 1 Alcohol Brief Interventions/Follow-up: Patient Refused Substance Abuse History in the last 12 months:  No. Consequences of Substance Abuse: NA Previous Psychotropic Medications: Yes  Psychological Evaluations: No  Past Medical History:  Past Medical History:  Diagnosis Date   Allergy [birth year]   Have had them my whole life   Anxiety    Chronic kidney disease    Costochondritis 09/02/2022   Depressed    History of kidney stones    MDD (major depressive disorder), single episode, severe (HCC) 04/28/2022   Migraine    Other chest pain 09/02/2022   Renal disorder     Past Surgical History:  Procedure Laterality Date   CESAREAN SECTION     CYSTOSCOPY W/ URETERAL STENT PLACEMENT Right 08/24/2016   Procedure: CYSTOSCOPY WITH RETROGRADE PYELOGRAM/URETERAL STENT PLACEMENT;  Surgeon: Malen Gauze, MD;  Location: [facility];  Service: Urology;  Laterality: Right;   CYSTOSCOPY WITH RETROGRADE PYELOGRAM, URETEROSCOPY AND STENT PLACEMENT Right 09/09/2016   Procedure: CYSTOSCOPY WITH RETROGRADE PYELOGRAM, URETEROSCOPY AND STENT REPLACEMENT;  Surgeon: Malen Gauze, MD;  Location: [facility];  Service: Urology;  Laterality: Right;   MANDIBLE SURGERY     Family History:  Family History  Problem Relation Age of Onset   Hypertension Mother    Ulcerative colitis Mother    Kidney disease Mother    Alcohol abuse Father    Drug abuse Father    Anxiety disorder Father    Drug abuse Brother    Kidney disease Brother    Anxiety disorder Maternal Grandfather    Cancer Maternal Grandfather    Anxiety disorder Maternal Grandmother    Kidney disease Sister    Family Psychiatric  History: Unremarkable Tobacco Screening:  Social History   Tobacco Use  Smoking Status Former   Current packs/day:  0.25   Average packs/day: 0.3 packs/day for 0.5 years (0.1 ttl pk-yrs)   Types: Cigarettes  Smokeless Tobacco Never  Tobacco Comments   I smoked off and on for a few months at a time but havent smoked in like 8 years    BH Tobacco Counseling     Are you interested in Tobacco Cessation Medications?  N/A, patient does not use tobacco products Counseled patient on smoking cessation:  N/A, patient does not use tobacco products Reason Tobacco Screening Not Completed: No value filed.       Social History:  Social History   Substance and Sexual Activity  Alcohol Use Yes   Comment: Hardly drink at all     Social History   Substance and Sexual Activity  Drug Use No    Additional Social History:                           Allergies:   Allergies  Allergen Reactions   Sulfa Antibiotics Other (See Comments)    Unknown, happened when she was a child   Lab Results:  Results for orders placed or performed during the hospital encounter of 01/06/23 (from the past 48 hour(s))  TSH     Status: None   Collection Time: 01/07/23  7:21 AM  Result Value Ref Range   TSH 1.396 0.350 - 4.500 uIU/mL    Comment: Performed by a 3rd Generation assay with a functional sensitivity of <=0.01 uIU/mL. Performed at [facility], [address] [place], [name] [address]   Lipid panel     Status: Abnormal   Collection Time: 01/07/23  7:21 AM  Result Value Ref Range   Cholesterol 191 0 - 200 mg/dL   Triglycerides 65 <604 mg/dL   HDL 45 >54 mg/dL   Total CHOL/HDL Ratio 4.2 RATIO   VLDL 13 0 - 40 mg/dL   LDL Cholesterol 098 (H) 0 - 99 mg/dL    Comment:        Total Cholesterol/HDL:CHD Risk Coronary Heart Disease Risk Table                     Men   Women  1/2 Average Risk   3.4   3.3  Average Risk       5.0   4.4  2 X Average Risk   9.6   7.1  3 X Average Risk  23.4   11.0        Use the calculated Patient Ratio above and the CHD Risk Table to determine the patient's CHD  Risk.        ATP III CLASSIFICATION (LDL):  <100     mg/dL   Optimal  119-147  mg/dL   Near or Above                    Optimal  130-159  mg/dL   Borderline  829-562  mg/dL   High  >130     mg/dL   Very High Performed at [facility], [address] [place], [name] [address]     Blood Alcohol level:  Lab Results  Component Value Date   [facility] <10 01/04/2023   ETH <10 05/02/2022    Metabolic Disorder Labs:  No results found for: "HGBA1C", "MPG" No results found for: "PROLACTIN" Lab Results  Component Value Date   CHOL 191 01/07/2023   TRIG 65 01/07/2023   HDL 45 01/07/2023   CHOLHDL 4.2 01/07/2023   VLDL 13 01/07/2023   LDLCALC 133 (H) 01/07/2023   LDLCALC 124 (H) 01/05/2023    Current Medications: Current Facility-Administered Medications  Medication Dose Route Frequency Provider Last Rate Last Admin   acetaminophen (TYLENOL) tablet 650 mg  650 mg Oral Q6H PRN Maryagnes Amos, FNP   650 mg at 01/06/23 1854   ALPRAZolam (XANAX) tablet 1 mg  1 mg Oral TID PRN Sarina Ill, DO       alum & mag hydroxide-simeth (MAALOX/MYLANTA) 200-200-20 MG/5ML suspension 30 mL  30 mL Oral Q4H PRN Starkes-Perry, Juel Burrow, FNP       diphenhydrAMINE (BENADRYL) capsule 50 mg  50 mg Oral TID PRN Maryagnes Amos, FNP       Or   diphenhydrAMINE (BENADRYL) injection 50 mg  50 mg Intramuscular TID PRN Maryagnes Amos, FNP       doxylamine (Sleep) (UNISOM) tablet 25 mg  25 mg Oral QHS PRN Marlou Porch,  Lanny Cramp, DO   25 mg at 01/06/23 2210   haloperidol (HALDOL) tablet 5 mg  5 mg Oral TID PRN Maryagnes Amos, FNP       Or   haloperidol lactate (HALDOL) injection 5 mg  5 mg Intramuscular TID PRN Rosario Adie, Juel Burrow, FNP       LORazepam (ATIVAN) tablet 2 mg  2 mg Oral TID PRN Maryagnes Amos, FNP   2 mg at 01/06/23 2324   Or   LORazepam (ATIVAN) injection 2 mg  2 mg Intramuscular TID PRN Maryagnes Amos, FNP       magnesium hydroxide  (MILK OF MAGNESIA) suspension 30 mL  30 mL Oral Daily PRN Maryagnes Amos, FNP       PARoxetine (PAXIL) tablet 20 mg  20 mg Oral Daily Sarina Ill, DO       risperiDONE (RISPERDAL) tablet 0.5 mg  0.5 mg Oral [id number] Sarina Ill, DO       PTA Medications: Medications Prior to Admission  Medication Sig Dispense Refill Last Dose   ALPRAZolam (XANAX) 0.5 MG tablet Take 1 tablet (0.5 mg total) by mouth 2 (two) times daily as needed for sleep or anxiety. (Patient taking differently: Take 0.25-0.5 mg by mouth daily as needed for sleep or anxiety.) 10 tablet 0    PARoxetine (PAXIL) 20 MG tablet Take 1 tablet (20 mg total) by mouth daily. 14 tablet 0     Musculoskeletal: Strength & Muscle Tone: within normal limits Gait & Station: normal Patient leans: N/A            Psychiatric Specialty Exam:  Presentation  General Appearance:  Disheveled  Eye Contact: Good  Speech: Clear and Coherent  Speech Volume: Decreased  Handedness: Right   Mood and Affect  Mood: Depressed; Anxious; Dysphoric; Hopeless; Labile; Worthless  Affect: Congruent; Tearful   Thought Process  Thought Processes: Coherent; Linear  Duration of Psychotic Symptoms:N/A Past Diagnosis of Schizophrenia or Psychoactive disorder: No  Descriptions of Associations:Intact  Orientation:Full (Time, Place and Person)  Thought Content:Rumination; Illogical; Abstract Reasoning  Hallucinations:Hallucinations: None  Ideas of Reference:None  Suicidal Thoughts:Suicidal Thoughts: Yes, Passive  Homicidal Thoughts:Homicidal Thoughts: No   Sensorium  Memory: Immediate Fair; Recent Fair; Remote Fair  Judgment: Poor  Insight: Poor   Executive Functions  Concentration: Fair  Attention Span: Fair  Recall: Fair  Fund of Knowledge: Fair  Language: Fair   Psychomotor Activity  Psychomotor Activity: Psychomotor Activity: Normal   Assets  Assets: Desire  for Improvement; Communication Skills; Social Support; Housing; Intimacy; Leisure Time; Transportation   Sleep  Sleep: Sleep: Poor Number of Hours of Sleep: 3    Physical Exam: Physical Exam Constitutional:      Appearance: Normal appearance.  HENT:     Head: Normocephalic and atraumatic.     Mouth/Throat:     Pharynx: Oropharynx is clear.  Eyes:     Pupils: Pupils are equal, round, and reactive to light.  Cardiovascular:     Rate and Rhythm: Normal rate and regular rhythm.  Pulmonary:     Effort: Pulmonary effort is normal.     Breath sounds: Normal breath sounds.  Abdominal:     General: Abdomen is flat.     Palpations: Abdomen is soft.  Musculoskeletal:        General: Normal range of motion.  Skin:    General: Skin is warm and dry.  Neurological:     General: No focal deficit present.  Mental Status: She is alert. Mental status is at baseline.  Psychiatric:        Attention and Perception: Attention and perception normal.        Mood and Affect: Mood is anxious. Affect is labile, flat and tearful.        Speech: Speech normal.        Behavior: Behavior is cooperative.        Thought Content: Thought content normal.        Cognition and Memory: Cognition and memory normal.        Judgment: Judgment normal.    Review of Systems  Constitutional: Negative.   HENT: Negative.    Eyes: Negative.   Respiratory: Negative.    Cardiovascular: Negative.   Gastrointestinal: Negative.   Genitourinary: Negative.   Musculoskeletal: Negative.   Skin: Negative.   Neurological: Negative.   Endo/Heme/Allergies: Negative.   Psychiatric/Behavioral:  Positive for depression. The patient is nervous/anxious and has insomnia.    Blood pressure 125/84, pulse 93, temperature 98.1 F (36.7 C), resp. rate 17, height 5\' 7"  (1.702 m), weight 71.2 kg, last menstrual period 12/20/2022, SpO2 100%. Body mass index is 24.59 kg/m.  Treatment Plan Summary: Daily contact with patient  to assess and evaluate symptoms and progress in treatment, Medication management, and Plan restart Paxil at 20 mg/day.  Seroquel as needed for sleep.  Xanax as needed for anxiety.  Risperdal 0.5 mg twice a day for anxiety and depression.  Observation Level/Precautions:  15 minute checks  Laboratory:  CBC Chemistry Profile  Psychotherapy:    Medications:    Consultations:    Discharge Concerns:    Estimated LOS:  Other:     Physician Treatment Plan for Primary Diagnosis: MDD (major depressive disorder), recurrent episode, severe (HCC) Long Term Goal(s): Improvement in symptoms so as ready for discharge  Short Term Goals: Ability to identify changes in lifestyle to reduce recurrence of condition will improve, Ability to verbalize feelings will improve, Ability to disclose and discuss suicidal ideas, Ability to demonstrate self-control will improve, Ability to identify and develop effective coping behaviors will improve, Ability to maintain clinical measurements within normal limits will improve, Compliance with prescribed medications will improve, and Ability to identify triggers associated with substance abuse/mental health issues will improve  Physician Treatment Plan for Secondary Diagnosis: Principal Problem:   MDD (major depressive disorder), recurrent episode, severe (HCC)   I certify that inpatient services furnished can reasonably be expected to improve the patient's condition.    Sarina Ill, DO 10/25/202412:09 PM
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
## 2023-01-07 NOTE — Group Note (Signed)
Date:  01/07/2023 Time:  9:40 PM  Group Topic/Focus:  Wrap-Up Group:   The focus of this group is to help patients review their daily goal of treatment and discuss progress on daily workbooks.    Participation Level:  Active  Participation Quality:  Appropriate, Attentive, Sharing, and Supportive  Affect:  Appropriate  Cognitive:  Appropriate  Insight: Appropriate and Good  Engagement in Group:  Supportive  Modes of Intervention:  Support  Additional Comments:     Belva Crome 01/07/2023, 9:40 PM
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
## 2023-01-07 NOTE — Plan of Care (Signed)
Patient very emotional and tearful this morning. Patient asking for discharge. Patient refused to take Risperdal. Patient states " It won't do anything." Patient denies SI,HI and AVH. Patient appears little brighter this afternoon. Visible in the milieu. Interacting with peers. Appetite and energy level good. Support and encouragement given.

## 2023-01-07 NOTE — Progress Notes (Addendum)
Virtual Visit via Telephone Note  I connected with Samantha Nguyen on 01/07/23 at  9:00 AM EDT by telephone and verified that I am speaking with the correct person using two identifiers.  Location: Patient: Home Provider: Office [facility] urgent care   I discussed the limitations, risks, security and privacy concerns of performing an evaluation and management service by telephone and the availability of in person appointments. I also discussed with the patient that there may be a patient responsible charge related to this service. The patient expressed understanding and agreed to proceed.   I discussed the assessment and treatment plan with the patient. The patient was provided an opportunity to ask questions and all were answered. The patient agreed with the plan and demonstrated an understanding of the instructions.   The patient was advised to call back or seek an in-person evaluation if the symptoms worsen or if the condition fails to improve as anticipated.  I provided 10 minutes of non-face-to-face time during this encounter.   Samantha Rack, NP   [facility]- [facility]  Discharge Summary  Samantha Nguyen [id number]  Admission date: 01/05/2023 Discharge date: 01/06/2023  Reason for admission: per MD Cosby admission note: Samantha Nguyen is a 37 y.o. female with a history of illness anxiety disorder surrounding health 2/2 bronchitis diagnosis in September, PTSD, and MDD who is admitted to [facility] for severe anxiety. Her anxiety has become so severe that it began affecting her work. She had a psychiatrist, who wanted to add to her Paxil, but after patient declined, switched from Paxil to Effexor in cross titration. She was unsure about titrating too quickly. 10/13, she had bad panic attacks that would not subside and ultimately had passive SI, just wanted it to stop. She went to [facility] and was increased to 75 mg Effexor.   Progress in Program  Toward Treatment Goals: Not Progressing  Progress (rationale): This provider received a crisis call related to medication management.  Patient encouraged to follow-up with [facility] urgent care for inpatient admission recommendation.  She denied suicidal or homicidal ideations.  Denies auditory or visual hallucinations.  Patient was seen and evaluated by psychiatrist Lucianne Muss.  Patient accepted to inpatient unit BMU.  Consideration for CBT therapy at discharge.   Collaboration of Care: Medication Management AEB patient reports restarting Paxil 10 mg due to withdrawals reported symptoms from discontinuing Paxil 2 days prior.   Patient/Guardian was advised Release of Information must be obtained prior to any record release in order to collaborate their care with an outside provider. Patient/Guardian was advised if they have not already done so to contact the registration department to sign all necessary forms in order for [name] to release information regarding their care.   Consent: Patient/Guardian gives verbal consent for treatment and assignment of benefits for services provided during this visit. Patient/Guardian expressed understanding and agreed to proceed.    Hillery Jacks NP 01/07/2023

## 2023-01-07 NOTE — Group Note (Signed)
Recreation Therapy Group Note   Group Topic:Leisure Education  Group Date: 01/07/2023 Start Time: 1015 End Time: 1120 Facilitators: Rosina Lowenstein, LRT, CTRS Location:  Craft Room  Group Description: Leisure. Patients were given the option to choose from singing karaoke, coloring mandalas, using oil pastels, journaling, or playing with play-doh. LRT and pts discussed the meaning of leisure, the importance of participating in leisure during their free time/when they're outside of the hospital, as well as how our leisure interests can also serve as coping skills.   Goal Area(s) Addressed:  Patient will identify a current leisure interest.  Patient will learn the definition of "leisure". Patient will practice making a positive decision. Patient will have the opportunity to try a new leisure activity. Patient will communicate with peers and LRT.    Affect/Mood: Anxious   Participation Level: Active and Engaged   Participation Quality: Independent   Behavior: Appropriate and On-looking   Speech/Thought Process: Coherent   Insight: Fair   Judgement: Fair    Modes of Intervention: Activity   Patient Response to Interventions:  Attentive, Engaged, and Receptive   Education Outcome:  Acknowledges education   Clinical Observations/Individualized Feedback: Samantha Nguyen was active in their participation of session activities and group discussion. Pt identified "sit outside with my dogs or hangout with my husband" as things she does in her free time. Pt chose to color with oil pastels and sing karaoke while in group. At the beginning of group, pt was tearful and fidgety. Pt was able to calm down and ended up signing karaoke independently. Pt interacted well with LRT and peers duration of session.   Plan: Continue to engage patient in RT group sessions 2-3x/week.   Rosina Lowenstein, LRT, CTRS 01/07/2023 12:36 PM

## 2023-01-07 NOTE — [facility] Suicide Risk Assessment (Signed)
[facility] INPATIENT:  Family/Significant Other Suicide Prevention Education  Suicide Prevention Education:  Education Completed; [name] Jones/husband [phone]), has been identified by the patient as the family member/significant other with whom the patient will be residing, and identified as the person(s) who will aid the patient in the event of a mental health crisis (suicidal ideations/suicide attempt).  With written consent from the patient, the family member/significant other has been provided the following suicide prevention education, prior to the and/or following the discharge of the patient.  The suicide prevention education provided includes the following: Suicide risk factors Suicide prevention and interventions National Suicide Hotline telephone number [facility] assessment telephone number [facility] Emergency Assistance 911 [facility] and/or Residential Mobile Crisis Unit telephone number  Request made of family/significant other to: Remove weapons (e.g., guns, rifles, knives), all items previously/currently identified as safety concern.   Remove drugs/medications (over-the-counter, prescriptions, illicit drugs), all items previously/currently identified as a safety concern.  The family member/significant other verbalizes understanding of the suicide prevention education information provided.  The family member/significant other agrees to remove the items of safety concern listed above.  Jones shared that pt has been having issues with her anti-depressant medications (medication adjustments that were being made) and sleeping at night. He stated that this has been going on for approximately a month. Jones denied feeling that pt is a danger to herself or anyone else. Pt also does not have access to weapons in the home.   Glenis Smoker 01/07/2023, 3:23 PM

## 2023-01-08 DIAGNOSIS — F332 Major depressive disorder, recurrent severe without psychotic features: Secondary | ICD-10-CM | POA: Diagnosis not present

## 2023-01-08 MED ORDER — PAROXETINE HCL 20 MG PO TABS: 20.0000 mg | ORAL_TABLET | Freq: Every day | ORAL | 3 refills | Status: DC

## 2023-01-08 NOTE — [facility] Suicide Risk Assessment (Signed)
[facility] Discharge Suicide Risk Assessment   Principal Problem: MDD (major depressive disorder), recurrent episode, severe (HCC) Discharge Diagnoses: Principal Problem:   MDD (major depressive disorder), recurrent episode, severe (HCC)   Total Time spent with patient: 1 hour  Musculoskeletal: Strength & Muscle Tone: within normal limits Gait & Station: normal Patient leans: N/A  Psychiatric Specialty Exam  Presentation  General Appearance:  Disheveled  Eye Contact: Good  Speech: Clear and Coherent  Speech Volume: Decreased  Handedness: Right   Mood and Affect  Mood: Depressed; Anxious; Dysphoric; Hopeless; Labile; Worthless  Duration of Depression Symptoms: Greater than two weeks  Affect: Congruent; Tearful   Thought Process  Thought Processes: Coherent; Linear  Descriptions of Associations:Intact  Orientation:Full (Time, Place and Person)  Thought Content:Rumination; Illogical; Abstract Reasoning  History of Schizophrenia/Schizoaffective disorder:No  Duration of Psychotic Symptoms:No data recorded Hallucinations:No data recorded Ideas of Reference:None  Suicidal Thoughts:No data recorded Homicidal Thoughts:No data recorded  Sensorium  Memory: Immediate Fair; Recent Fair; Remote Fair  Judgment: Poor  Insight: Poor   Executive Functions  Concentration: Fair  Attention Span: Fair  Recall: Fair  Fund of Knowledge: Fair  Language: Fair   Psychomotor Activity  Psychomotor Activity:No data recorded  Assets  Assets: Desire for Improvement; Communication Skills; Social Support; Housing; Intimacy; Leisure Time; Transportation   Sleep  Sleep:No data recorded   Blood pressure (!) 143/96, pulse (!) 128, temperature 98.2 F (36.8 C), resp. rate 17, height 5\' 7"  (1.702 m), weight 71.2 kg, last menstrual period 12/20/2022, SpO2 100%. Body mass index is 24.59 kg/m.  Mental Status Per Nursing Assessment::   On Admission:   NA  Demographic Factors:  Caucasian  Loss Factors: NA  Historical Factors: NA  Risk Reduction Factors:   Sense of responsibility to family, Living with another person, especially a relative, Positive social support, Positive therapeutic relationship, and Positive coping skills or problem solving skills  Continued Clinical Symptoms:  Severe Anxiety and/or Agitation  Cognitive Features That Contribute To Risk:  None    Suicide Risk:  Minimal: No identifiable suicidal ideation.  Patients presenting with no risk factors but with morbid ruminations; may be classified as minimal risk based on the severity of the depressive symptoms   Follow-up Information     [facility] Follow up.   Why: Office was closed on Friday, office open Monday at 10, please call to schedule follow up. Contact information: [address] [place], [name]  [address]  Tel: [phone]        [facility] Follow up.   Why: Unable to contact to schedule follow up. Please follow up with your therapist.                Plan Of Care/Follow-up recommendations: [facility]  Sarina Ill, DO 01/08/2023, 10:12 AM

## 2023-01-08 NOTE — Plan of Care (Signed)
  Problem: Education: Goal: Emotional status will improve Outcome: Progressing   

## 2023-01-08 NOTE — Progress Notes (Signed)
  [facility] Adult Case Management Discharge Plan :  Will you be returning to the same living situation after discharge:  Yes,  [address]  [place] [address]  At discharge, do you have transportation home?: Yes,  Husband will pick patient up Do you have the ability to pay for your medications: Yes,  [employer] Plan  Release of information consent forms completed and in the chart;  Patient's signature needed at discharge.  Patient to Follow up at:  Follow-up Information     [facility] Follow up.   Why: Office was closed on Friday, office open Monday at 10, please call to schedule follow up. Contact information: [address] [place], [name]  [address]  Tel: [phone]        [facility] Follow up.   Why: Unable to contact to schedule follow up. Please follow up with your therapist.                Next level of care provider has access to [facility] Link:yes  Safety Planning and Suicide Prevention discussed: Yes,   [name] Jones/husband [phone])     Has patient been referred to the Quitline?: Patient does not use tobacco/nicotine products  Patient has been referred for addiction treatment: Yes, referral information given but appointment not made [facility] (list facility).  Marshell Levan, LCSW 01/08/2023, 9:43 AM

## 2023-01-08 NOTE — Discharge Summary (Signed)
Physician Discharge Summary Note  Patient:  Samantha Nguyen is an 37 y.o., female MRN:  [id number] DOB:  Jan 01, 1986 Patient phone:  [phone] (home)  Patient address:   [address] [place] [address],  Total Time spent with patient: 1 hour  Date of Admission:  01/06/2023 Date of Discharge: 01/08/2023  Reason for Admission:   Samantha Nguyen is a 37 year old white female who is voluntarily admitted to inpatient psychiatry for worsening depression, extreme anxiety and panic attacks.  She denies suicidal ideation but her level of anxiety is pretty high.  She is not sleeping very well.  She ended up in the hospital a few years ago with sepsis due to kidney stones and had to have surgery which was very traumatizing to her and she recently got sick and this reminded her of that time.  Also, her medications were changed from Paxil to Effexor after her recent illness which has contributed to her extreme anxiety and insomnia.  I also believe that she is depressed but she does not complain of that and denies any suicidal ideation.  She is currently in the partial hospitalization program at [facility] through [facility].  She says that she did best on Paxil and I told her I will switch back.  She also takes Xanax 0.5 up to 3 times a day but states it is not working.  She currently lives in the [place] with her husband.  She is tearful throughout the interview but asked to go home, so her insight is questionable.   Principal Problem: MDD (major depressive disorder), recurrent episode, severe (HCC) Discharge Diagnoses: Principal Problem:   MDD (major depressive disorder), recurrent episode, severe (HCC)   Past Psychiatric History: Severe anxiety and depression, PTSD  Past Medical History:  Past Medical History:  Diagnosis Date   Allergy 11/02/1985   Have had them my whole life   Anxiety    Chronic kidney disease    Costochondritis 09/02/2022   Depressed    History of kidney stones    MDD (major  depressive disorder), single episode, severe (HCC) 04/28/2022   Migraine    Other chest pain 09/02/2022   Renal disorder     Past Surgical History:  Procedure Laterality Date   CESAREAN SECTION     CYSTOSCOPY W/ URETERAL STENT PLACEMENT Right 08/24/2016   Procedure: CYSTOSCOPY WITH RETROGRADE PYELOGRAM/URETERAL STENT PLACEMENT;  Surgeon: Malen Gauze, MD;  Location: [facility];  Service: Urology;  Laterality: Right;   CYSTOSCOPY WITH RETROGRADE PYELOGRAM, URETEROSCOPY AND STENT PLACEMENT Right 09/09/2016   Procedure: CYSTOSCOPY WITH RETROGRADE PYELOGRAM, URETEROSCOPY AND STENT REPLACEMENT;  Surgeon: Malen Gauze, MD;  Location: [facility];  Service: Urology;  Laterality: Right;   MANDIBLE SURGERY     Family History:  Family History  Problem Relation Age of Onset   Hypertension Mother    Ulcerative colitis Mother    Kidney disease Mother    Alcohol abuse Father    Drug abuse Father    Anxiety disorder Father    Drug abuse Brother    Kidney disease Brother    Anxiety disorder Maternal Grandfather    Cancer Maternal Grandfather    Anxiety disorder Maternal Grandmother    Kidney disease Sister    Family Psychiatric  History: Unremarkable Social History:  Social History   Substance and Sexual Activity  Alcohol Use Yes   Comment: Hardly drink at all     Social History   Substance and Sexual Activity  Drug Use No  Social History   Socioeconomic History   Marital status: Single    Spouse name: Not on file   Number of children: 1   Years of education: Not on file   Highest education level: Some college, no degree  Occupational History   Not on file  Tobacco Use   Smoking status: Former    Current packs/day: 0.25    Average packs/day: 0.3 packs/day for 0.5 years (0.1 ttl pk-yrs)    Types: Cigarettes   Smokeless tobacco: Never   Tobacco comments:    I smoked off and on for a few months at a time but havent smoked in like 8 years  Vaping  Use   Vaping status: Some Days   Substances: CBD  Substance and Sexual Activity   Alcohol use: Yes    Comment: Hardly drink at all   Drug use: No   Sexual activity: Not Currently    Birth control/protection: Condom  Other Topics Concern   Not on file  Social History Narrative   Not on file   Social Determinants of Health   Financial Resource Strain: Not on file  Food Insecurity: No Food Insecurity (01/06/2023)   Hunger Vital Sign    Worried About Running Out of Food in the Last Year: Never true    Ran Out of Food in the Last Year: Never true  Transportation Needs: No Transportation Needs (01/06/2023)   PRAPARE - Administrator, Civil Service (Medical): No    Lack of Transportation (Non-Medical): No  Physical Activity: Not on file  Stress: Not on file  Social Connections: Not on file    Hospital Course: Samantha Nguyen is a 37 year old white female who was voluntarily admitted to inpatient psychiatry for depression, worsening anxiety, and panic attacks.  She goes to partial hospitalization program at [facility].  Her medications were recently changed from Paxil to Effexor after an illness and her anxiety worsened.  She came very distraught and came to the emergency room where she was admitted.  I put her back on Paxil 20 mg/day and started her on a low-dose of Risperdal.  She refused Risperdal.  She insisted on leaving the hospital.  She denied being suicidal.  I told her that it would have to be AGAINST MEDICAL ADVICE.  She agreed.  On the day of discharge her judgment and insight were poor.  Physical Findings: AIMS:  , ,  ,  ,    CIWA:    COWS:     Musculoskeletal: Strength & Muscle Tone: within normal limits Gait & Station: normal Patient leans: N/A   Psychiatric Specialty Exam:  Presentation  General Appearance:  Disheveled  Eye Contact: Good  Speech: Clear and Coherent  Speech Volume: Decreased  Handedness: Right   Mood and Affect   Mood: Depressed; Anxious; Dysphoric; Hopeless; Labile; Worthless  Affect: Congruent; Tearful   Thought Process  Thought Processes: Coherent; Linear  Descriptions of Associations:Intact  Orientation:Full (Time, Place and Person)  Thought Content:Rumination; Illogical; Abstract Reasoning  History of Schizophrenia/Schizoaffective disorder:No  Duration of Psychotic Symptoms:No data recorded Hallucinations:No data recorded Ideas of Reference:None  Suicidal Thoughts:No data recorded Homicidal Thoughts:No data recorded  Sensorium  Memory: Immediate Fair; Recent Fair; Remote Fair  Judgment: Poor  Insight: Poor   Executive Functions  Concentration: Fair  Attention Span: Fair  Recall: Fair  Fund of Knowledge: Fair  Language: Fair   Psychomotor Activity  Psychomotor Activity:No data recorded  Assets  Assets: Desire for Improvement; Communication Skills;  Social Support; Housing; Intimacy; Leisure Time; Transportation   Sleep  Sleep:No data recorded   Physical Exam: Physical Exam ROS Blood pressure (!) 143/96, pulse (!) 128, temperature 98.2 F (36.8 C), resp. rate 17, height 5\' 7"  (1.702 m), weight 71.2 kg, last menstrual period 12/20/2022, SpO2 100%. Body mass index is 24.59 kg/m.   Social History   Tobacco Use  Smoking Status Former   Current packs/day: 0.25   Average packs/day: 0.3 packs/day for 0.5 years (0.1 ttl pk-yrs)   Types: Cigarettes  Smokeless Tobacco Never  Tobacco Comments   I smoked off and on for a few months at a time but havent smoked in like 8 years   Tobacco Cessation:  A prescription for an FDA-approved tobacco cessation medication was offered at discharge and the patient refused   Blood Alcohol level:  Lab Results  Component Value Date   ETH <10 01/04/2023   ETH <10 05/02/2022    Metabolic Disorder Labs:  No results found for: "HGBA1C", "MPG" No results found for: "PROLACTIN" Lab Results  Component Value  Date   CHOL 191 01/07/2023   TRIG 65 01/07/2023   HDL 45 01/07/2023   CHOLHDL 4.2 01/07/2023   VLDL 13 01/07/2023   LDLCALC 133 (H) 01/07/2023   LDLCALC 124 (H) 01/05/2023    See Psychiatric Specialty Exam and Suicide Risk Assessment completed by Attending Physician prior to discharge.  Discharge destination:  Home  Is patient on multiple antipsychotic therapies at discharge:  No   Has Patient had three or more failed trials of antipsychotic monotherapy by history:  No  Recommended Plan for Multiple Antipsychotic Therapies: NA   Allergies as of 01/08/2023       Reactions   Sulfa Antibiotics Other (See Comments)   Unknown, happened when she was a child        Medication List     TAKE these medications      Indication  ALPRAZolam 0.5 MG tablet Commonly known as: Xanax Take 1 tablet (0.5 mg total) by mouth 2 (two) times daily as needed for sleep or anxiety. What changed:  how much to take when to take this  Indication: Feeling Anxious   PARoxetine 20 MG tablet Commonly known as: PAXIL Take 1 tablet (20 mg total) by mouth daily.  Indication: Generalized Anxiety Disorder, Major Depressive Disorder, Panic Disorder        Follow-up Information     [facility] Follow up.   Why: Office was closed on Friday, office open Monday at 10, please call to schedule follow up. Contact information: [address] [place], [name]  [address]  Tel: [phone]        [facility] Follow up.   Why: Unable to contact to schedule follow up. Please follow up with your therapist.                Follow-up recommendations:  [facility]    Signed: Sarina Ill, DO 01/08/2023, 10:18 AM

## 2023-01-08 NOTE — Progress Notes (Signed)
Patient is discharging at this time. Patient is A&Ox4. Stable. Patient denies SI,HI, and A/V/H with no plan/intent. Printed AVS reviewed with and given to patient along with medications and follow up appointments. Suicide safety plan complete with copy provided to patient. Original form in assigned binder. Patient verbalized all understanding. All valuables/belongings returned to patient. Patient is being transported by her husband . Patient denies any pain/discomfort. No s/s of current distress.

## 2023-01-08 NOTE — Progress Notes (Signed)
   01/07/23 1100  Psych Admission Type (Psych Patients Only)  Admission Status Voluntary  Psychosocial Assessment  Patient Complaints Anxiety  Eye Contact Fair  Facial Expression Anxious  Affect Anxious  Speech Logical/coherent  Interaction Assertive  Motor Activity Slow  Appearance/Hygiene Unremarkable  Behavior Characteristics Cooperative  Mood Anxious  Thought Process  Coherency WDL  Content WDL  Delusions None reported or observed  Perception WDL  Hallucination None reported or observed  Judgment Impaired  Confusion WDL  Danger to Self  Current suicidal ideation? Denies  Danger to Others  Danger to Others None reported or observed

## 2023-01-10 ENCOUNTER — Ambulatory Visit (HOSPITAL_COMMUNITY): Payer: MEDICAID

## 2023-01-10 ENCOUNTER — Encounter: Payer: Self-pay | Admitting: Family

## 2023-01-10 ENCOUNTER — Telehealth (HOSPITAL_COMMUNITY): Payer: Self-pay | Admitting: Professional

## 2023-01-10 ENCOUNTER — Telehealth: Payer: Self-pay | Admitting: *Deleted

## 2023-01-10 DIAGNOSIS — F410 Panic disorder [episodic paroxysmal anxiety] without agoraphobia: Secondary | ICD-10-CM

## 2023-01-10 DIAGNOSIS — F331 Major depressive disorder, recurrent, moderate: Secondary | ICD-10-CM

## 2023-01-10 DIAGNOSIS — F4310 Post-traumatic stress disorder, unspecified: Secondary | ICD-10-CM

## 2023-01-10 LAB — CYTOLOGY - PAP
Adequacy: ABSENT
Chlamydia: NEGATIVE
Comment: NEGATIVE
Comment: NEGATIVE
Comment: NEGATIVE
Comment: NORMAL
Diagnosis: NEGATIVE
High risk HPV: NEGATIVE
Neisseria Gonorrhea: NEGATIVE
Trichomonas: NEGATIVE

## 2023-01-10 NOTE — Telephone Encounter (Signed)
Pt called asking why she was "kicked out" of PHP. Cln explained pt went inpt and should have been provided referrals from there due to PHP not working for pt. Cln encouraged pt to call ins company for referrals that accept her medicaid b/c pt reports "I feel like I am getting the run around." Pt reports she was switched from [facility] to [facility] and has had problems finding providers that accept [employer]. Pt reports psychiatrist she had discharged her because "I told her the [employer]il[employer] wasn't working." Pt reports she is tired of being so anxious, but does not want to die. She denies SI/HI. Cln again encourages pt to call insurance to find providers that can help her due to recent providers and PHP not being the right care plan for her at this time. Pt reports understanding.

## 2023-01-10 NOTE — Transitions of Care (Post Inpatient/ED Visit) (Signed)
01/10/2023  Name: Samantha Nguyen MRN: [id number] DOB: 1986/01/05  Today's TOC FU Call Status: Today's TOC FU Call Status:: Successful TOC FU Call Completed TOC FU Call Complete Date: 01/10/23 Patient's Name and Date of Birth confirmed.  Transition Care Management Follow-up Telephone Call Date of Discharge: 01/08/23 Discharge Facility: [facility]) Type of Discharge: Inpatient Admission Primary Inpatient Discharge Diagnosis:: major depressive disorder), recurrent episode, severe How have you been since you were released from the hospital?: Better (but still having some anxiety) Any questions or concerns?: Yes Patient Questions/Concerns:: she is still having some anxiety and feels she needs something added to the medications that she has. She does not want to start any antipsychotic medications. Patient has talked with her therapist but they do not prescribe medications. Patient Questions/Concerns Addressed: Other: (RN discussed talking with her PCP. Patient stated she has the [employer] case manage number and has talked with her. She is also looking into other practices)  Items Reviewed: Did you receive and understand the discharge instructions provided?: Yes Medications obtained,verified, and reconciled?: Yes (Medications Reviewed) Any new allergies since your discharge?: No Dietary orders reviewed?: No Do you have support at home?: Yes People in Home: spouse Name of Support/Comfort Primary Source: Samantha Nguyen  Medications Reviewed Today: Medications Reviewed Today     Reviewed by Luella Cook, RN (Case Manager) on 01/10/23 at 1419  Med List Status: <None>   Medication Order Taking? Sig Documenting Provider Last Dose Status Informant  ALPRAZolam (XANAX) 0.5 MG tablet [id number] Yes Take 1 tablet (0.5 mg total) by mouth 2 (two) times daily as needed for sleep or anxiety.  Patient taking differently: Take 0.25-0.5 mg by mouth daily as needed  for sleep or anxiety.   Orson Eva, NP Taking Active Self  PARoxetine (PAXIL) 20 MG tablet [id number] Yes Take 1 tablet (20 mg total) by mouth daily. Sarina Ill, DO Taking Active             Home Care and Equipment/Supplies: Were Home Health Services Ordered?: NA Any new equipment or medical supplies ordered?: NA  Functional Questionnaire: Do you need assistance with bathing/showering or dressing?: No Do you need assistance with meal preparation?: No Do you need assistance with eating?: No Do you have difficulty maintaining continence: No Do you need assistance with getting out of bed/getting out of a chair/moving?: No  Follow up appointments reviewed: PCP Follow-up appointment confirmed?: NA Specialist Hospital Follow-up appointment confirmed?: No Reason Specialist Follow-Up Not Confirmed: Patient has Specialist Provider Number and will Call for Appointment (Patient has followed up with [facility]) Do you need transportation to your follow-up appointment?: No Do you understand care options if your condition(s) worsen?: Yes-patient verbalized understanding   Interventions Today    Flowsheet Row Most Recent Value  General Interventions   General Interventions Discussed/Reviewed General Interventions Discussed, General Interventions Reviewed, Doctor Visits, Community Resources  Mental Health Interventions   Mental Health Discussed/Reviewed Mental Health Discussed, Mental Health Reviewed, Anxiety, Other  [Referred to her [employer] Case Manager. She stated she had spoken with them.]  Pharmacy Interventions   Pharmacy Dicussed/Reviewed Pharmacy Topics Discussed, Pharmacy Topics Reviewed, Medications and their functions      TOC Interventions Today    Flowsheet Row Most Recent Value  TOC Interventions   TOC Interventions Discussed/Reviewed TOC Interventions Discussed, TOC Interventions Reviewed     Gean Maidens BSN RN [facility]  Management [phone]

## 2023-01-11 ENCOUNTER — Encounter (HOSPITAL_COMMUNITY): Payer: Self-pay

## 2023-01-11 ENCOUNTER — Ambulatory Visit (HOSPITAL_COMMUNITY): Payer: MEDICAID

## 2023-01-11 ENCOUNTER — Emergency Department (HOSPITAL_COMMUNITY)
Admission: EM | Admit: 2023-01-11 | Discharge: 2023-01-11 | Disposition: A | Discharge status: Home / Self Care | Payer: MEDICAID | Attending: Emergency Medicine | Admitting: Emergency Medicine

## 2023-01-11 ENCOUNTER — Other Ambulatory Visit: Payer: Self-pay

## 2023-01-11 DIAGNOSIS — R000 Tachycardia, unspecified: Secondary | ICD-10-CM | POA: Diagnosis not present

## 2023-01-11 DIAGNOSIS — F419 Anxiety disorder, unspecified: Secondary | ICD-10-CM | POA: Diagnosis present

## 2023-01-11 LAB — COMPREHENSIVE METABOLIC PANEL
ALT: 20 U/L (ref 0–44)
AST: 20 U/L (ref 15–41)
Albumin: 4.3 g/dL (ref 3.5–5.0)
Alkaline Phosphatase: 48 U/L (ref 38–126)
Anion gap: 17 — ABNORMAL HIGH (ref 5–15)
BUN: 11 mg/dL (ref 6–20)
CO2: 20 mmol/L — ABNORMAL LOW (ref 22–32)
Calcium: 10.4 mg/dL — ABNORMAL HIGH (ref 8.9–10.3)
Chloride: 101 mmol/L (ref 98–111)
Creatinine, Ser: 0.79 mg/dL (ref 0.44–1.00)
GFR, Estimated: >60 mL/min (ref >60)
Glucose, Bld: 89 mg/dL (ref 70–99)
Potassium: 4.1 mmol/L (ref 3.5–5.1)
Sodium: 138 mmol/L (ref 135–145)
Total Bilirubin: 0.8 mg/dL (ref 0.3–1.2)
Total Protein: 7.4 g/dL (ref 6.5–8.1)

## 2023-01-11 LAB — CBC WITH DIFFERENTIAL/PLATELET
Abs Immature Granulocytes: 0.03 10*3/uL (ref 0.00–0.07)
Basophils Absolute: 0 10*3/uL (ref 0.0–0.1)
Basophils Relative: 0 %
Eosinophils Absolute: 0 10*3/uL (ref 0.0–0.5)
Eosinophils Relative: 0 %
HCT: 40.5 % (ref 36.0–46.0)
Hemoglobin: 13.3 g/dL (ref 12.0–15.0)
Immature Granulocytes: 0 %
Lymphocytes Relative: 25 %
Lymphs Abs: 2.1 10*3/uL (ref 0.7–4.0)
MCH: 28 pg (ref 26.0–34.0)
MCHC: 32.8 g/dL (ref 30.0–36.0)
MCV: 85.3 fL (ref 80.0–100.0)
Monocytes Absolute: 0.6 10*3/uL (ref 0.1–1.0)
Monocytes Relative: 7 %
Neutro Abs: 5.8 10*3/uL (ref 1.7–7.7)
Neutrophils Relative %: 68 %
Platelets: 342 10*3/uL (ref 150–400)
RBC: 4.75 MIL/uL (ref 3.87–5.11)
RDW: 11.9 % (ref 11.5–15.5)
WBC: 8.5 10*3/uL (ref 4.0–10.5)
nRBC: 0 % (ref 0.0–0.2)

## 2023-01-11 LAB — URINALYSIS, ROUTINE W REFLEX MICROSCOPIC
Bilirubin Urine: NEGATIVE
Glucose, UA: NEGATIVE mg/dL
Hgb urine dipstick: NEGATIVE
Ketones, ur: 20 mg/dL — AB
Leukocytes,Ua: NEGATIVE
Nitrite: NEGATIVE
Protein, ur: NEGATIVE mg/dL
Specific Gravity, Urine: 1.014 (ref 1.005–1.030)
pH: 5 (ref 5.0–8.0)

## 2023-01-11 LAB — MAGNESIUM: Magnesium: 2 mg/dL (ref 1.7–2.4)

## 2023-01-11 LAB — HCG, QUANTITATIVE, PREGNANCY: hCG, Beta Chain, Quant, S: <1 m[IU]/mL (ref <5)

## 2023-01-11 MED ORDER — LORAZEPAM 1 MG PO TABS
0.5000 mg | ORAL_TABLET | Freq: Once | ORAL | Status: AC
  Administered 2023-01-11: 0.5 mg via ORAL
  Filled 2023-01-11: qty 1

## 2023-01-11 NOTE — Telephone Encounter (Signed)
Possibly - let Allyne Gee know we can check her blood levels for both Folic acid & homocysteine if she'd like, can order labs & schedule a non-fasting lab only appt.

## 2023-01-11 NOTE — Discharge Instructions (Addendum)
You were seen in the emergency room today for anxiety.  Your lab work, urinalysis came back unremarkable.  Your CBC did not show any increased white blood cell count or anemia.  Your magnesium and potassium were normal.  No sign that you are having urinary tract infection. I would like for you to follow-up with primary care as well as a behavioral health specialist for today's visit.  I would recommend going to behavioral health urgent care attached to discharge paperwork for further evaluation of anxiety if needed.  Please return to the emergency room if you have any new or worsening symptoms.

## 2023-01-11 NOTE — ED Provider Notes (Signed)
[place] EMERGENCY DEPARTMENT AT [facility] Provider Note   CSN: [id number] Arrival date & time: 01/11/23  1305     History  Chief Complaint  Patient presents with   Anxiety    Samantha Nguyen is a 37 y.o. female patient with past medical history of major depressive disorder, severe anxiety with panic, GERD, major depressive disorder, kidney stone presenting to the emergency room with increased anxiety and feeling shaky that started this morning.  Patient really was recently discharged from inpatient psych on Saturday.  She had a recent medication change, she has been taking Paxil for 5 days.  Patient took Ativan this morning around 9:00 which did not help with her anxiety.  She called EMS and was taken here because she was concerned that maybe her blood sugar was low.  At the time of my evaluation, patient reports mild anxiety but states she starting to feel better.  Patient reports that she was recently diagnosed with kidney stone and has had intermittent symptoms from this, denies current symptoms. Reports occasional diarrhea at baseline. Patient denies chest pain, chest tightness, shortness of breath, abdominal pain, fever or chills.  Denies feeling down depressed or hopeless.  Denies SI or HI.  Denies hallucinations.   Anxiety       Home Medications Prior to Admission medications   Medication Sig Start Date End Date Taking? Authorizing Provider  ALPRAZolam Prudy Feeler) 0.5 MG tablet Take 1 tablet (0.5 mg total) by mouth 2 (two) times daily as needed for sleep or anxiety. Patient taking differently: Take 0.25-0.5 mg by mouth daily as needed for sleep or anxiety. 07/27/22 07/27/23  Orson Eva, NP  PARoxetine (PAXIL) 20 MG tablet Take 1 tablet (20 mg total) by mouth daily. 01/08/23   Sarina Ill, DO      Allergies    Sulfa antibiotics    Review of Systems   Review of Systems  Physical Exam Updated Vital Signs BP (!) 142/125 (BP Location: Left Arm)    Pulse (!) 123   Temp 98.8 F (37.1 C) (Oral)   Resp 18   LMP 12/20/2022 (Exact Date)   SpO2 99%  Physical Exam Vitals and nursing note reviewed.  Constitutional:      General: She is not in acute distress.    Appearance: She is not toxic-appearing.     Comments: Appears anxious   HENT:     Head: Normocephalic and atraumatic.  Eyes:     General: No scleral icterus.    Conjunctiva/sclera: Conjunctivae normal.  Cardiovascular:     Rate and Rhythm: Regular rhythm. Tachycardia present.     Pulses: Normal pulses.     Heart sounds: Normal heart sounds. No murmur heard. Pulmonary:     Effort: Pulmonary effort is normal. No respiratory distress.     Breath sounds: Normal breath sounds. No stridor. No wheezing or rales.  Abdominal:     General: Abdomen is flat. Bowel sounds are normal.     Palpations: Abdomen is soft.     Tenderness: There is no abdominal tenderness.  Musculoskeletal:     Right lower leg: No edema.     Left lower leg: No edema.  Skin:    General: Skin is warm and dry.     Capillary Refill: Capillary refill takes less than 2 seconds.     Findings: No lesion.  Neurological:     General: No focal deficit present.     Mental Status: She is alert and oriented to person,  place, and time. Mental status is at baseline.     ED Results / Procedures / Treatments   Labs (all labs ordered are listed, but only abnormal results are displayed) Labs Reviewed  COMPREHENSIVE METABOLIC PANEL - Abnormal; Notable for the following components:      Result Value   CO2 20 (*)    Calcium 10.4 (*)    Anion gap 17 (*)    All other components within normal limits  URINALYSIS, ROUTINE W REFLEX MICROSCOPIC - Abnormal; Notable for the following components:   Ketones, ur 20 (*)    All other components within normal limits  CBC WITH DIFFERENTIAL/PLATELET  HCG, QUANTITATIVE, PREGNANCY  MAGNESIUM    EKG None  Radiology No results found.  Procedures Procedures    Medications  Ordered in ED Medications - No data to display  ED Course/ Medical Decision Making/ A&P                                 Medical Decision Making Amount and/or Complexity of Data Reviewed Labs: ordered.  Risk Prescription drug management.   This patient presents to the ED for concern of anxiety, this involves an extensive number of treatment options, and is a complaint that carries with it a high risk of complications and morbidity.  The differential diagnosis includes infection, electrolyte abnormality, dehydration, anxiety, depression   Co morbidities that complicate the patient evaluation  Major depressive disorder, severe anxiety with panic, GERD, major depressive disorder, kidney stone    Additional history obtained:  Additional history obtained from 01/04/2023   Lab Tests:  I personally interpreted labs.  The pertinent results include:   Cbc no leukocytosis, hemoglobin 13.3 Urinalysis negative nitrates negative leukocytes Negative pregnancy test Magnesium 2 CMP BUN/creatinine within normal limits, no electrolyte abnormality.  Slight elevation in anion gap neutrophils likely due to intermittent diarrhea.    Imaging Studies ordered:  None    Consultations Obtained:  None    Problem List / ED Course / Critical interventions / Medication management  Patient reporting to emergency room with anxiety.  Patient has history of anxiety and was recently admitted for anxiety.  Patient took Ativan at home which did not help resolve her symptoms.  By the time I saw patient her anxiety had slighlty improved.  Gave Ativan for anxiety.  Labs came back showing no sign of infection no electrolyte abnormality, EKG shows sinus tachycardia which improved after Ativan.  Patient is not currently having any pain or discomfort.  Overall exam is reassuring, given that patient hemodynamically stable and has good follow-up, had improved symptoms during stay in ED, I will have her follow-up  outpatient with primary care.  Patient also has appointment to see behavioral health specialist in November.  Discussed [facility] UC with patient as well patient preferred not to go at this time but understands it is a resource for her.  I ordered medication including Ativan  for anxiety  Reevaluation of the patient after these medicines showed that the patient improved I have reviewed the patients home medicines and have made adjustments as needed   Plan  Go to behavioral health urgent care for further evaluation of anxiety.  F/u w/ PCP in 2-3d to ensure resolution of sx. Patient has appointment with behavioral health scheduled next month. Patient was given return precautions. Patient stable for discharge at this time.  Patient educated on sx/dx and verbalized understanding of plan. Return  to ER w/ new or worsening sx.          Final Clinical Impression(s) / ED Diagnoses Final diagnoses:  None    Rx / DC Orders ED Discharge Orders     None         Reinaldo Raddle 01/11/23 2133    Melene Plan, DO 01/11/23 2201

## 2023-01-11 NOTE — ED Provider Triage Note (Signed)
Emergency Medicine Provider Triage Evaluation Note  Samantha Nguyen , a 37 y.o. female  was evaluated in triage.  Pt complains of increased anxiety, tremors, and jitteriness since being tapered off paxil and started on effexor. Restarted paxil recently and is not feeling better. Fired by PCP.   Review of Systems  Positive: tremors Negative: Nausea, chest pain  Physical Exam  BP (!) 142/125 (BP Location: Left Arm)   Pulse (!) 123   Temp 98.8 F (37.1 C) (Oral)   Resp 18   LMP 12/20/2022 (Exact Date)   SpO2 99%  Gen:   Awake, no distress   Resp:  Normal effort  MSK:   Moves extremities without difficulty  Other:  +tremor, anxious appearing  Medical Decision Making  Medically screening exam initiated at 1:24 PM.  Appropriate orders placed.  Hayslee Gensch was informed that the remainder of the evaluation will be completed by another provider, this initial triage assessment does not replace that evaluation, and the importance of remaining in the ED until their evaluation is complete.     Pete Pelt, [name] 01/11/23 1326

## 2023-01-11 NOTE — ED Triage Notes (Signed)
Patient BIB [facility] from home for tremors and anxiety. Patient recently switched from Effexor to paxil on Thursday taking 10mg  Thursday to Sunday and 20 mg Monday and Tuesday (today). Patient is currently experiences tremors and anxiety, home dose of ativan did not help.

## 2023-01-12 ENCOUNTER — Ambulatory Visit (HOSPITAL_COMMUNITY): Payer: MEDICAID

## 2023-01-12 NOTE — Telephone Encounter (Signed)
Yes ok to add but should be done in the morning before 9am if possible.

## 2023-01-13 ENCOUNTER — Ambulatory Visit (HOSPITAL_COMMUNITY): Payer: MEDICAID

## 2023-01-13 NOTE — Psych (Signed)
Virtual Visit via Video Note  I connected with [name] on 01/03/23 at  9:00 AM EDT by a video enabled telemedicine application and verified that I am speaking with the correct person using two identifiers.  Location: Patient: patient home Provider: clinical home office   I discussed the limitations of evaluation and management by telemedicine and the availability of in person appointments. The patient expressed understanding and agreed to proceed.  I discussed the assessment and treatment plan with the patient. The patient was provided an opportunity to ask questions and all were answered. The patient agreed with the plan and demonstrated an understanding of the instructions.   The patient was advised to call back or seek an in-person evaluation if the symptoms worsen or if the condition fails to improve as anticipated.  Pt was provided 240 minutes of non-face-to-face time during this encounter.   Donia Guiles, LCSW   [facility] PHP THERAPIST PROGRESS NOTE  Samantha Nguyen [id number]  Session Time: 9:00 -10:00  Participation Level: Active  Behavioral Response: CasualAlertAnxious  Type of Therapy: Group Therapy  Treatment Goals addressed: Coping  Progress Towards Goals: Progressing  Interventions: CBT, DBT, Supportive, and Reframing  Summary: Samantha Nguyen is a 37 y.o. female who presents with anxiety.  Clinician led check-in regarding current stressors and situation, and review of patient completed daily inventory. Clinician utilized active listening and empathetic response and validated patient emotions. Clinician facilitated processing group on pertinent issues.?    Therapist Response: Patient arrived within time allowed. Patient rates her mood at a 2.5 on a scale of 1-10 with 10 being best. Pt states she feels "really anxious." Pt states she slept 7.5 hours and ate 2x. Pt reports high anxiety re: a med change at the beginning of the month. Pt states she does not sleep the  same, feels weird, and has mood swings. Pt is unable to say whether these feelings are different than when on her previous medication that she felt was not working. Pt is tearful throughout session and states "I'm just really scared" throughout session. Pt responds well when cln uses CBT thought challenges with her and is unable to manage challenges on her own. Pt states she went to the [employer] yesterday with her husband and had a panic attack and used her PRN medication. Pt states she was at the event for multiple hours.  Patient able to process. Patient engaged in discussion.             Session Time: 10:00 am - 11:00 am   Participation Level: Active   Behavioral Response: CasualAlertDepressed   Type of Therapy: Group Therapy   Treatment Goals addressed: Coping   Progress Towards Goals: Progressing   Interventions: CBT, DBT, Solution Focused, Strength-based, Supportive, and Reframing   Therapist Response:  Cln led discussion on extending grace and kindness to ourselves. Group discussed the messages they give themselves and how it impacts them. Cln discussed the best friend test as a way to calibrate whether we are being fair to ourselves or not and encouraged pt's to consider, would I believe this/say this about someone I cared about?     Therapist Response: Pt engaged in discussion and shares difficulties with being kind to themself.          Session Time: 11:00 -12:00   Participation Level: Active   Behavioral Response: CasualAlertDepressed   Type of Therapy: Group Therapy   Treatment Goals addressed: Coping   Progress Towards Goals: Progressing   Interventions: CBT, DBT,  Solution Focused, Strength-based, Supportive, and Reframing   Summary: Cln introduced DBT concept of radical acceptance. Cln discussed radical acceptance as a strategy to decrease distress and to manage situations outside of their control. Group volunteered struggles they are experiencing with control  and cln helped group apply radical acceptance.    Therapist Response: Pt engaged in discussion and identified ways they can apply radical acceptance in their life.            Session Time: 12:00 -1:00   Participation Level: Active   Behavioral Response: CasualAlertDepressed   Type of Therapy: Group therapy, Occupational Therapy   Treatment Goals addressed: Coping   Progress Towards Goals: Progressing   Interventions: Supportive; Psychoeducation   Summary: 12:00 - 12:50: Occupational Therapy group led by cln E. Hollan. 12:50 - 1:00 Clinician assessed for immediate needs, medication compliance and efficacy, and safety concerns.   Therapist Response: 12:00 - 12:50: Pt participated 12:50 - 1:00 pm: At check-out, patient reports no immediate concerns. Patient demonstrates progress as evidenced participation in first group session. Patient denies SI/HI/self-harm thoughts at the end of group.    Suicidal/Homicidal: Nowithout intent/plan   Plan: Pt will continue in PHP while working to decrease anxiety symptoms, increase emotion regulation, and increase ability to manage symptoms in a healthy manner.   Collaboration of Care: Medication Management AEB T Lewis  Patient/Guardian was advised Release of Information must be obtained prior to any record release in order to collaborate their care with an outside provider. Patient/Guardian was advised if they have not already done so to contact the registration department to sign all necessary forms in order for [name] to release information regarding their care.   Consent: Patient/Guardian gives verbal consent for treatment and assignment of benefits for services provided during this visit. Patient/Guardian expressed understanding and agreed to proceed.   Diagnosis: Illness anxiety disorder [F45.21]    1. Illness anxiety disorder   2. MDD (major depressive disorder), recurrent episode, moderate (HCC)   3. PTSD (post-traumatic stress  disorder)       Donia Guiles, LCSW
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
## 2023-01-13 NOTE — Psych (Signed)
Virtual Visit via Video Note  I connected with Samantha Nguyen on 01/06/23 at  9:00 AM EDT by a video enabled telemedicine application and verified that I am speaking with the correct person using two identifiers.  Location: Patient: patient home Provider: clinical home office   I discussed the limitations of evaluation and management by telemedicine and the availability of in person appointments. The patient expressed understanding and agreed to proceed.  I discussed the assessment and treatment plan with the patient. The patient was provided an opportunity to ask questions and all were answered. The patient agreed with the plan and demonstrated an understanding of the instructions.   The patient was advised to call back or seek an in-person evaluation if the symptoms worsen or if the condition fails to improve as anticipated.  Pt was provided 20 minutes of non-face-to-face time during this encounter.   Samantha Guiles, LCSW   [facility] PHP THERAPIST PROGRESS NOTE  Samantha Nguyen [id number]  Session Time: 9:00 -10:00  Participation Level: Active  Behavioral Response: CasualAlertAnxious  Type of Therapy: Group Therapy  Treatment Goals addressed: Coping  Progress Towards Goals: Progressing  Interventions: CBT, DBT, Supportive, and Reframing  Summary: Samantha Nguyen is a 37 y.o. female who presents with anxiety.  Clinician led check-in regarding current stressors and situation, and review of patient completed daily inventory. Clinician utilized active listening and empathetic response and validated patient emotions. Clinician facilitated processing group on pertinent issues.?    Therapist Response: Patient is openly sobbing when she joins group session. Cln spoke to pt in a private virtual room prior to group beginning. Pt states she is "so scared" and feels unsafe. Pt reports she stopped the Effexor as was recommended and now is "falling apart." Pt reports she took her old Paxil  prescription and is "freaking out" that she "messed up." Pt is escalated and difficult to talk to through the sobbing.  Cln consulted provider T. Lewis who states pt has also called the crisis line this morning and should come into [facility] to be assessed for inpatient due to feeling unsafe and escalated presentation. [name] states she is at [facility] currently and will educate staff there on the case.  Cln spoke with pt and relayed recommendation. Pt states she is willing but doesn't have anyone to take her as her husband is unable. Cln sat with pt while she called mother-in-law who also states she is unable to take pt. Pt reports willingness to call a ride share. Cln confirmed pt typed in the [facility] address correctly and waited with pt while she waited for the ride.  Cln coached pt with CBT and mantras. Pt repeated "I am not in immediate danger. I am safe. I just need help straightening out my head." Pt repeated mantra back to cln in between deep breaths.  Cln educated pt what a possible inpatient admission would entail and educated her that in this case it is to keep pt safe and for her to be in a controlled setting while she found medication that worked for her. Pt states that is what she wants and is open to possibility of inpatient. Pt hung up the phone when her ride arrived.  Cln monitored Epic to confirm pt arrived at [facility].          Suicidal/Homicidal: Nowithout intent/plan   Plan: Pt will discharge from PHP due to admission into inpatient unit.   Collaboration of Care: Medication Management AEB T Lewis  Patient/Guardian was advised Release of Information  must be obtained prior to any record release in order to collaborate their care with an outside provider. Patient/Guardian was advised if they have not already done so to contact the registration department to sign all necessary forms in order for [name] to release information regarding their care.   Consent: Patient/Guardian gives verbal consent for  treatment and assignment of benefits for services provided during this visit. Patient/Guardian expressed understanding and agreed to proceed.   Diagnosis: Illness anxiety disorder [F45.21]    1. Illness anxiety disorder   2. PTSD (post-traumatic stress disorder)   3. MDD (major depressive disorder), recurrent episode, moderate (HCC)       Samantha Guiles, LCSW

## 2023-01-13 NOTE — Addendum Note (Signed)
Addended by: Candie Chroman on: 01/13/2023 09:40 AM   Modules accepted: Orders

## 2023-01-14 ENCOUNTER — Ambulatory Visit (HOSPITAL_COMMUNITY): Payer: MEDICAID

## 2023-01-14 ENCOUNTER — Encounter: Payer: Self-pay | Admitting: Family

## 2023-01-17 ENCOUNTER — Ambulatory Visit (HOSPITAL_COMMUNITY): Payer: MEDICAID

## 2023-01-18 ENCOUNTER — Ambulatory Visit (HOSPITAL_COMMUNITY): Payer: MEDICAID

## 2023-01-19 ENCOUNTER — Emergency Department (HOSPITAL_BASED_OUTPATIENT_CLINIC_OR_DEPARTMENT_OTHER)
Admission: EM | Admit: 2023-01-19 | Discharge: 2023-01-19 | Disposition: A | Discharge status: Home / Self Care | Payer: MEDICAID | Attending: Emergency Medicine | Admitting: Emergency Medicine

## 2023-01-19 ENCOUNTER — Other Ambulatory Visit: Payer: Self-pay

## 2023-01-19 ENCOUNTER — Encounter (HOSPITAL_BASED_OUTPATIENT_CLINIC_OR_DEPARTMENT_OTHER): Payer: Self-pay | Admitting: Emergency Medicine

## 2023-01-19 ENCOUNTER — Ambulatory Visit (HOSPITAL_COMMUNITY): Payer: MEDICAID

## 2023-01-19 DIAGNOSIS — F410 Panic disorder [episodic paroxysmal anxiety] without agoraphobia: Secondary | ICD-10-CM | POA: Insufficient documentation

## 2023-01-19 DIAGNOSIS — F419 Anxiety disorder, unspecified: Secondary | ICD-10-CM | POA: Insufficient documentation

## 2023-01-19 MED ORDER — LORAZEPAM 1 MG PO TABS
2.0000 mg | ORAL_TABLET | Freq: Once | ORAL | Status: AC
  Administered 2023-01-19: 2 mg via ORAL
  Filled 2023-01-19: qty 2

## 2023-01-19 NOTE — ED Triage Notes (Signed)
C/o continuous panic attack. X a few weeks, but worse last night Tearful in triage " I dont want to die" Headache, "stomach hurts"

## 2023-01-19 NOTE — ED Provider Notes (Signed)
[place] EMERGENCY DEPARTMENT AT [facility] Provider Note   CSN: [id number] Arrival date & time: 01/19/23  1127     History  Chief Complaint  Patient presents with   Panic Attack    Samantha Nguyen is a 37 y.o. female.  The patient is a 2 female with past medical history of anxiety presenting for panic attack.  Patient states she started a new job today and was at her new job when she started to feel shaky and panicky.  She states " I do not want to die".  She sees a therapist and takes Paxil daily.  She is prescribed as needed Xanax but has not taken it in the last 2 days because she is tired of taking it and feeling tired.  She does have a scheduled appointment with a psychologist for this Friday.  She denies any suicidal or homicidal ideation.  She denies any auditory or visual hallucinations.  She denies struggling with substance abuse disorder.  The history is provided by the patient. No language interpreter was used.       Home Medications Prior to Admission medications   Medication Sig Start Date End Date Taking? Authorizing Provider  ALPRAZolam Prudy Feeler) 0.5 MG tablet Take 1 tablet (0.5 mg total) by mouth 2 (two) times daily as needed for sleep or anxiety. Patient taking differently: Take 0.25-0.5 mg by mouth daily as needed for sleep or anxiety. 07/27/22 07/27/23  Orson Eva, NP  PARoxetine (PAXIL) 20 MG tablet Take 1 tablet (20 mg total) by mouth daily. 01/08/23   Sarina Ill, DO      Allergies    Sulfa antibiotics    Review of Systems   Review of Systems  Constitutional:  Negative for chills and fever.  HENT:  Negative for ear pain and sore throat.   Eyes:  Negative for pain and visual disturbance.  Respiratory:  Negative for cough and shortness of breath.   Cardiovascular:  Negative for chest pain and palpitations.  Gastrointestinal:  Negative for abdominal pain and vomiting.  Genitourinary:  Negative for dysuria and hematuria.   Musculoskeletal:  Negative for arthralgias and back pain.  Skin:  Negative for color change and rash.  Neurological:  Negative for seizures and syncope.  Psychiatric/Behavioral:  The patient is nervous/anxious.   All other systems reviewed and are negative.   Physical Exam Updated Vital Signs BP (!) 143/95   Pulse 99   Temp 98.2 F (36.8 C)   Resp 20   LMP 01/16/2023 (Exact Date)   SpO2 99%  Physical Exam Vitals and nursing note reviewed.  Constitutional:      General: She is not in acute distress.    Appearance: She is well-developed.  HENT:     Head: Normocephalic and atraumatic.  Eyes:     Conjunctiva/sclera: Conjunctivae normal.  Cardiovascular:     Rate and Rhythm: Normal rate and regular rhythm.     Heart sounds: No murmur heard. Pulmonary:     Effort: Pulmonary effort is normal. No respiratory distress.  Musculoskeletal:     Cervical back: Neck supple.  Skin:    General: Skin is warm and dry.     Capillary Refill: Capillary refill takes less than 2 seconds.  Neurological:     Mental Status: She is alert.  Psychiatric:        Attention and Perception: Attention and perception normal.        Mood and Affect: Mood is anxious.  Behavior: Behavior normal. Behavior is cooperative.        Thought Content: Thought content normal.     Comments: Tremulous     ED Results / Procedures / Treatments   Labs (all labs ordered are listed, but only abnormal results are displayed) Labs Reviewed - No data to display  EKG None  Radiology No results found.  Procedures Procedures    Medications Ordered in ED Medications  LORazepam (ATIVAN) tablet 2 mg (2 mg Oral Given 01/19/23 1309)    ED Course/ Medical Decision Making/ A&P                                 Medical Decision Making Risk Prescription drug management.   3 female with past medical history of anxiety presenting for panic attack.  Is alert and oriented x 3, no acute distress, febrile, with  tachycardia and tachypnea.  Patient is tremulous on exam.  Ativan given.  Patient symptoms have improved significantly.  Right now she is sleeping but is easily arousable.  Requesting for discharge home to sleep in her own bed at this time.  Has outpatient follow-up with psychologist first thing Friday morning for generalized anxiety disorder and panic attacks.  She continues to deny any suicidal or homicidal ideations.  Symptoms were provoked from starting a new job today.  Patient in no distress and overall condition improved here in the ED. Detailed discussions were had with the patient regarding current findings, and need for close f/u with PCP or on call doctor. The patient has been instructed to return immediately if the symptoms worsen in any way for re-evaluation. Patient verbalized understanding and is in agreement with current care plan. All questions answered prior to discharge.         Final Clinical Impression(s) / ED Diagnoses Final diagnoses:  Panic attack    Rx / DC Orders ED Discharge Orders     None         Franne Forts, DO 01/19/23 1506

## 2023-01-19 NOTE — ED Notes (Signed)
Pt alert and oriented X 4 at the time of discharge. RR even and unlabored. No acute distress noted. Pt verbalized understanding of discharge instructions as discussed. Pt ambulatory to lobby at time of discharge.

## 2023-01-19 NOTE — Discharge Instructions (Signed)
Please follow-up with your psychologist appointment on Friday.  Please take your as needed Xanax as needed for panic attacks.  Return to emergency department for any worsening concerning signs or symptoms that are unmanageable at the house.

## 2023-01-20 ENCOUNTER — Ambulatory Visit (HOSPITAL_COMMUNITY): Payer: MEDICAID

## 2023-01-21 ENCOUNTER — Other Ambulatory Visit: Payer: Self-pay | Admitting: Medical Genetics

## 2023-01-21 ENCOUNTER — Encounter (HOSPITAL_COMMUNITY): Payer: Self-pay

## 2023-01-21 ENCOUNTER — Ambulatory Visit (HOSPITAL_COMMUNITY): Payer: MEDICAID

## 2023-01-21 DIAGNOSIS — Z006 Encounter for examination for normal comparison and control in clinical research program: Secondary | ICD-10-CM

## 2023-01-31 ENCOUNTER — Encounter: Payer: Self-pay | Admitting: Family

## 2023-02-01 NOTE — Telephone Encounter (Signed)
Let Samantha Nguyen know I am sorry to hear. She can take up to 3 Ibuprofen 3x/day w/food, or 2 generic Aleve twice a day for the next 5 days. Apply heat or ice (whichever gives better pain relief) for up to 20 minutes 3x/day. Can schedule office visit if pt has not improved in 2 weeks.

## 2023-02-02 ENCOUNTER — Ambulatory Visit (INDEPENDENT_AMBULATORY_CARE_PROVIDER_SITE_OTHER): Payer: MEDICAID | Admitting: Family

## 2023-02-02 ENCOUNTER — Encounter: Payer: Self-pay | Admitting: Family

## 2023-02-02 DIAGNOSIS — E538 Deficiency of other specified B group vitamins: Secondary | ICD-10-CM

## 2023-02-02 DIAGNOSIS — M542 Cervicalgia: Secondary | ICD-10-CM | POA: Diagnosis not present

## 2023-02-02 DIAGNOSIS — M5489 Other dorsalgia: Secondary | ICD-10-CM | POA: Diagnosis not present

## 2023-02-02 DIAGNOSIS — F410 Panic disorder [episodic paroxysmal anxiety] without agoraphobia: Secondary | ICD-10-CM | POA: Diagnosis not present

## 2023-02-02 LAB — UNMAPPED LAB RESULTS
ABO RH Blood Type (HT): A POS
Antibody Screen (HT): NEGATIVE
Basophil # (HT): 0.1 10 3/uL (ref 0.0–0.2)
Basophil % (HT): 0 % (ref 0–3)
Eosinophil # (HT): 0.1 10 3/uL (ref 0.0–0.6)
Eosinophil % (HT): 1 % (ref 0–5)
Hematocrit (HT): 30 % — ABNORMAL LOW (ref 35–47)
Hemoglobin (HGB) (HT): 7.9 g/dL — ABNORMAL LOW (ref 12.0–16.0)
Lymphocyte # (HT): 2.4 10 3/uL (ref 1.0–4.8)
Lymphocyte % (HT): 20 % (ref 15–45)
MCHC (HT): 26.8 g/dL — ABNORMAL LOW (ref 31.0–37.5)
MCV (HT): 78 fL — ABNORMAL LOW (ref 80–100)
Mean Corpuscular Hemoglobin (MCH) (HT): 20.8 pg — ABNORMAL LOW (ref 26.0–34.0)
Monocyte # (HT): 0.9 10 3/uL (ref 0.1–1.0)
Monocyte % (HT): 7 % (ref 0–15)
Neutrophil # (HT): 8.7 10 3/uL — ABNORMAL HIGH (ref 1.8–8.0)
Platelets (HT): 375 10 3/uL (ref 150–450)
RBC (HT): 3.79 10 6/uL — ABNORMAL LOW (ref 3.80–5.20)
RDW (HT): 17.1 % — ABNORMAL HIGH (ref 0.0–15.2)
Seg Neut % (HT): 71 % (ref 45–75)
WBC (HT): 12.2 10 3/uL — ABNORMAL HIGH (ref 4.0–11.0)

## 2023-02-02 NOTE — Assessment & Plan Note (Signed)
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 -Order labs for folate, homocysteine, and vitamin B12 levels. -Discuss potential need for specialized vitamin supplementation or injections depending on lab results. -Consider referral for genetic counseling regarding MTHFR gene mutation. -Advised patient to return for cortisol level testing in the morning.  -Continue meds and current POC thru [facility] -F/U prn
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
## 2023-02-03 LAB — FOLATE: Folate: 14.1 ng/mL (ref >5.9)

## 2023-02-03 LAB — VITAMIN B12: Vitamin B-12: 194 pg/mL — ABNORMAL LOW (ref 211–911)

## 2023-02-04 LAB — HOMOCYSTEINE: Homocysteine: 8.3 umol/L (ref <10.4)

## 2023-02-07 ENCOUNTER — Encounter: Payer: Self-pay | Admitting: Family

## 2023-02-07 DIAGNOSIS — E538 Deficiency of other specified B group vitamins: Secondary | ICD-10-CM

## 2023-02-07 MED ORDER — CYANOCOBALAMIN 1000 MCG/ML IJ SOLN: 1000.0000 ug | Freq: Once | INTRAMUSCULAR | Status: DC

## 2023-02-07 NOTE — Addendum Note (Signed)
Addended byDulce Sellar on: 02/07/2023 08:37 AM   Modules accepted: Orders

## 2023-02-11 MED ORDER — HYDROXOCOBALAMIN ACETATE 1000 MCG/ML IM SOLN
1.0000 mL | INTRAMUSCULAR | 0 refills | Status: DC
Start: 2023-02-11 — End: 2023-04-05

## 2023-02-16 ENCOUNTER — Ambulatory Visit (INDEPENDENT_AMBULATORY_CARE_PROVIDER_SITE_OTHER): Payer: MEDICAID | Admitting: Family

## 2023-02-16 ENCOUNTER — Encounter: Payer: Self-pay | Admitting: Family

## 2023-02-16 ENCOUNTER — Other Ambulatory Visit (HOSPITAL_COMMUNITY): Payer: MEDICAID

## 2023-02-16 VITALS — BP 126/79 | HR 80 | Temp 98.0°F | Ht 67.0 in | Wt 161.4 lb

## 2023-02-16 DIAGNOSIS — Z862 Personal history of diseases of the blood and blood-forming organs and certain disorders involving the immune mechanism: Secondary | ICD-10-CM

## 2023-02-16 DIAGNOSIS — Z789 Other specified health status: Secondary | ICD-10-CM | POA: Diagnosis not present

## 2023-02-16 DIAGNOSIS — E538 Deficiency of other specified B group vitamins: Secondary | ICD-10-CM

## 2023-02-16 DIAGNOSIS — M5489 Other dorsalgia: Secondary | ICD-10-CM

## 2023-02-16 LAB — IBC + FERRITIN
Ferritin: 9.9 ng/mL — ABNORMAL LOW (ref 10.0–291.0)
Iron: 238 ug/dL — ABNORMAL HIGH (ref 42–145)
Saturation Ratios: 63 % — ABNORMAL HIGH (ref 20.0–50.0)
TIBC: 378 ug/dL (ref 250.0–450.0)
Transferrin: 270 mg/dL (ref 212.0–360.0)

## 2023-02-16 LAB — FOLATE: Folate: 15.5 ng/mL (ref >5.9)

## 2023-02-16 MED ORDER — HYDROXOCOBALAMIN 5 G IV SOLR: 1.0000 mg | Freq: Once | INTRAVENOUS | Status: DC

## 2023-02-16 MED ORDER — HYDROXOCOBALAMIN ACETATE 1000 MCG/ML IM SOLN
1.0000 mL | INTRAMUSCULAR | Status: AC
  Administered 2023-02-16: 1 mL via INTRAMUSCULAR

## 2023-02-16 NOTE — Progress Notes (Signed)
Patient ID: Samantha Nguyen, female    DOB: [birth year], 37 y.o.   MRN: [id number]  Chief Complaint  Patient presents with   Back Pain    Pt c/o Continued Lower back pain. Has tried heating pads and tylenol which does help slightly.    Anemia    Pt would like her iron levels checked.    Discussed the use of AI scribe software for clinical note transcription with the patient, who gave verbal consent to proceed.  History of Present Illness   The patient, with a history of vitamin B12 deficiency, presents with concerns about her overall vitamin B levels. She has been researching the effects of vitamin B deficiencies and believes her eczema, anxiety, and thin hair may be related. She requests a comprehensive vitamin B panel to assess her levels. She also mentions a change in her diet, specifically a reduction in red meat consumption, which she believes may have worsened her B12 levels.  In addition to her concerns about vitamin B levels, the patient also reports experiencing back pain. The pain, which she describes as a dull, achy, shooting sensation, radiates down her left leg. The pain is worse in the morning and improves as she moves around during the day. She compares the sensation to sciatica, which she experienced during pregnancy. She has been managing the pain with over-the-counter anti-inflammatories.          Assessment & Plan:   Vitamin B12 Deficiency/Vegetarian diet - Pt has brought in her own Vitamin B12 medication for injection today. Pt desires to have her husband do the injections for next 3 weeks. Patient is currently receiving weekly injections for four weeks, with a plan to transition to oral medication. Pt concerned if her other B vitamin levels are low d/t her anxiety and vegetarian diet. Discussed the role of B vitamins and the lack of evidence for supplementation if levels are normal. Patient has reduced red meat intake which could potentially impact B12 levels. -Administer B12  injection, teaching provided for home injections. Pt also advised husband can return w/pt for nurse visit for next injection if needed. -Order B vitamin panel including  B3, B5, B6, & B7.  -Plan to recheck patient's status in two months, including a recheck of B12 level.  Lower Back Pain - Patient reports a dull, achy, shooting pain in the lower back, radiating down the left leg, similar to previous experience of sciatica during pregnancy. Pain is worse in the morning and improves with movement. Over-the-counter anti-inflammatories provide some relief. -Advised patient to continue with anti-inflammatories, consider trying over-the-counter lidocaine patches, and maintain regular movement and stretching. -Advised if pain becomes unbearable or impacts daily function, consider a short course of steroids or a muscle relaxer.  Hx of Iron Deficiency - -Check iron levels today.     Subjective:    Outpatient Medications Prior to Visit  Medication Sig Dispense Refill   ALPRAZolam (XANAX) 0.5 MG tablet Take 1 tablet (0.5 mg total) by mouth 2 (two) times daily as needed for sleep or anxiety. 10 tablet 0   fexofenadine (ALLEGRA) 180 MG tablet Take 180 mg by mouth daily.     Hydroxocobalamin Acetate 1000 MCG/ML SOLN Inject 1 mL into the muscle once a week. 4 mL 0   LORazepam (ATIVAN) 1 MG tablet Take 0.5-1 mg by mouth daily as needed.     PARoxetine (PAXIL) 20 MG tablet Take 1 tablet (20 mg total) by mouth daily. 30 tablet 3   Probiotic Product (  PROBIOTIC-10 PO) Take by mouth.     No facility-administered medications prior to visit.   Past Medical History:  Diagnosis Date   Allergy [birth year]   Have had them my whole life   Anxiety    Chronic kidney disease    Costochondritis 09/02/2022   Depressed    History of kidney stones    MDD (major depressive disorder), recurrent episode, severe (HCC) 01/06/2023   MDD (major depressive disorder), single episode, severe (HCC) 04/28/2022   Migraine     Other chest pain 09/02/2022   Renal disorder    Past Surgical History:  Procedure Laterality Date   CESAREAN SECTION     CYSTOSCOPY W/ URETERAL STENT PLACEMENT Right 08/24/2016   Procedure: CYSTOSCOPY WITH RETROGRADE PYELOGRAM/URETERAL STENT PLACEMENT;  Surgeon: Malen Gauze, MD;  Location: [facility];  Service: Urology;  Laterality: Right;   CYSTOSCOPY WITH RETROGRADE PYELOGRAM, URETEROSCOPY AND STENT PLACEMENT Right 09/09/2016   Procedure: CYSTOSCOPY WITH RETROGRADE PYELOGRAM, URETEROSCOPY AND STENT REPLACEMENT;  Surgeon: Malen Gauze, MD;  Location: [facility];  Service: Urology;  Laterality: Right;   MANDIBLE SURGERY     Allergies  Allergen Reactions   Sulfa Antibiotics Other (See Comments)    Unknown, happened when she was a child      Objective:    Physical Exam Vitals and nursing note reviewed.  Constitutional:      Appearance: Normal appearance.  Cardiovascular:     Rate and Rhythm: Normal rate and regular rhythm.  Pulmonary:     Effort: Pulmonary effort is normal.     Breath sounds: Normal breath sounds.  Musculoskeletal:        General: Normal range of motion.  Skin:    General: Skin is warm and dry.  Neurological:     Mental Status: She is alert.  Psychiatric:        Mood and Affect: Mood normal.        Behavior: Behavior normal.    BP 126/79   Pulse 80   Temp 98 F (36.7 C) (Temporal)   Ht 5\' 7"  (1.702 m)   Wt 161 lb 6.4 oz (73.2 kg)   LMP 01/16/2023 (Exact Date)   SpO2 100%   BMI 25.28 kg/m  Wt Readings from Last 3 Encounters:  02/16/23 161 lb 6.4 oz (73.2 kg)  02/02/23 157 lb 3.2 oz (71.3 kg)  01/11/23 157 lb (71.2 kg)       Dulce Sellar, NP

## 2023-02-18 ENCOUNTER — Other Ambulatory Visit: Payer: Self-pay | Admitting: Family

## 2023-02-18 DIAGNOSIS — E538 Deficiency of other specified B group vitamins: Secondary | ICD-10-CM

## 2023-02-21 ENCOUNTER — Encounter: Payer: Self-pay | Admitting: Family

## 2023-02-22 ENCOUNTER — Telehealth: Payer: Self-pay | Admitting: Family

## 2023-02-22 NOTE — Telephone Encounter (Signed)
Noted  

## 2023-02-23 ENCOUNTER — Ambulatory Visit: Payer: MEDICAID

## 2023-02-23 DIAGNOSIS — E538 Deficiency of other specified B group vitamins: Secondary | ICD-10-CM

## 2023-02-23 LAB — BIOTIN (VITAMIN B7): Biotin (Vitamin B7): 1383.1 pg/mL (ref 221.0–3004.0)

## 2023-02-23 LAB — VITAMIN B6: Vitamin B6: 10 ng/mL (ref 2.1–21.7)

## 2023-02-23 LAB — VITAMIN B3
Nicotinamide: <20 ng/mL
Nicotinic Acid: <20 ng/mL

## 2023-02-23 MED ORDER — HYDROXOCOBALAMIN ACETATE 1000 MCG/ML IM SOLN
1.0000 mL | INTRAMUSCULAR | Status: AC
  Administered 2023-02-23: 1 mL via INTRAMUSCULAR

## 2023-02-23 NOTE — Telephone Encounter (Signed)
error 

## 2023-02-23 NOTE — Progress Notes (Signed)
Patient is in office today for a nurse visit for B12 Injection, per PCP's order. Patient Injection was given in the  Right deltoid. Patient tolerated injection well.

## 2023-03-02 ENCOUNTER — Encounter: Payer: Self-pay | Admitting: Family

## 2023-03-02 ENCOUNTER — Ambulatory Visit: Payer: MEDICAID

## 2023-03-03 ENCOUNTER — Telehealth: Payer: MEDICAID

## 2023-03-03 ENCOUNTER — Telehealth: Payer: MEDICAID | Admitting: Physician Assistant

## 2023-03-03 DIAGNOSIS — J029 Acute pharyngitis, unspecified: Secondary | ICD-10-CM

## 2023-03-03 LAB — VITAMIN B5: Vitamin B5: 34.9 ng/mL (ref 12.9–253.1)

## 2023-03-03 NOTE — Progress Notes (Signed)
E-Visit for Sore Throat  We are sorry that you are not feeling well.  Here is how we plan to help!  Your symptoms indicate a likely viral infection (Pharyngitis).   Pharyngitis is inflammation in the back of the throat which can cause a sore throat, scratchiness and sometimes difficulty swallowing.   Pharyngitis is typically caused by a respiratory virus and will just run its course.  Please keep in mind that your symptoms could last up to 10 days.  For throat pain, we recommend over the counter oral pain relief medications such as acetaminophen or aspirin, or anti-inflammatory medications such as ibuprofen or naproxen sodium.  Topical treatments such as oral throat lozenges or sprays may be used as needed.  Avoid close contact with loved ones, especially the very young and elderly.  Remember to wash your hands thoroughly throughout the day as this is the number one way to prevent the spread of infection and wipe down door knobs and counters with disinfectant.  After careful review of your answers, I would not recommend an antibiotic for your condition.  Antibiotics should not be used to treat conditions that we suspect are caused by viruses like the virus that causes the common cold or flu. However, some people can have Strep with atypical symptoms. You may need formal testing in clinic or office to confirm if your symptoms continue or worsen.  Providers prescribe antibiotics to treat infections caused by bacteria. Antibiotics are very powerful in treating bacterial infections when they are used properly.  To maintain their effectiveness, they should be used only when necessary.  Overuse of antibiotics has resulted in the development of super bugs that are resistant to treatment!    You will need to call your primary care office to discuss any blood work and results that they performed, as well as to have further discussion regarding your anxiety and possible treatments.   I have sent a work note to  Pharmacologist. You can find by going to the Menu on your homepage, scrolling down to the Communications section, and selecting Letters. Let us know if you have any issue locating. Take care and feel better soon!   Home Care: Only take medications as instructed by your medical team. Do not drink alcohol while taking these medications. A steam or ultrasonic humidifier can help congestion.  You can place a towel over your head and breathe in the steam from hot water coming from a faucet. Avoid close contacts especially the very young and the elderly. Cover your mouth when you cough or sneeze. Always remember to wash your hands.  Get Help Right Away If: You develop worsening fever or throat pain. You develop a severe head ache or visual changes. Your symptoms persist after you have completed your treatment plan.  Make sure you Understand these instructions. Will watch your condition. Will get help right away if you are not doing well or get worse.   Thank you for choosing an e-visit.  Your e-visit answers were reviewed by a board certified advanced clinical practitioner to complete your personal care plan. Depending upon the condition, your plan could have included both over the counter or prescription medications.  Please review your pharmacy choice. Make sure the pharmacy is open so you can pick up prescription now. If there is a problem, you may contact your provider through [employer] and have the prescription routed to another pharmacy.  Your safety is important to [name]. If you have drug allergies check your  prescription carefully.   For the next 24 hours you can use MyChart to ask questions about today's visit, request a non-urgent call back, or ask for a work or school excuse. You will get an email in the next two days asking about your experience. I hope that your e-visit has been valuable and will speed your recovery.

## 2023-03-03 NOTE — Progress Notes (Signed)
I have spent 5 minutes in review of e-visit questionnaire, review and updating patient chart, medical decision making and response to patient.   Mia Milan Cody Jacklynn Dehaas, PA-C    

## 2023-03-04 ENCOUNTER — Emergency Department (HOSPITAL_BASED_OUTPATIENT_CLINIC_OR_DEPARTMENT_OTHER)
Admission: EM | Admit: 2023-03-04 | Discharge: 2023-03-04 | Disposition: A | Discharge status: Home / Self Care | Payer: MEDICAID | Attending: Emergency Medicine | Admitting: Emergency Medicine

## 2023-03-04 ENCOUNTER — Emergency Department (HOSPITAL_BASED_OUTPATIENT_CLINIC_OR_DEPARTMENT_OTHER): Payer: MEDICAID | Admitting: Radiology

## 2023-03-04 ENCOUNTER — Other Ambulatory Visit: Payer: Self-pay

## 2023-03-04 ENCOUNTER — Other Ambulatory Visit (HOSPITAL_BASED_OUTPATIENT_CLINIC_OR_DEPARTMENT_OTHER): Payer: Self-pay

## 2023-03-04 ENCOUNTER — Ambulatory Visit: Payer: MEDICAID

## 2023-03-04 ENCOUNTER — Encounter (HOSPITAL_BASED_OUTPATIENT_CLINIC_OR_DEPARTMENT_OTHER): Payer: Self-pay | Admitting: Emergency Medicine

## 2023-03-04 ENCOUNTER — Emergency Department (HOSPITAL_BASED_OUTPATIENT_CLINIC_OR_DEPARTMENT_OTHER): Payer: MEDICAID

## 2023-03-04 DIAGNOSIS — J029 Acute pharyngitis, unspecified: Secondary | ICD-10-CM

## 2023-03-04 DIAGNOSIS — Z1152 Encounter for screening for COVID-19: Secondary | ICD-10-CM | POA: Insufficient documentation

## 2023-03-04 DIAGNOSIS — J028 Acute pharyngitis due to other specified organisms: Secondary | ICD-10-CM | POA: Diagnosis not present

## 2023-03-04 DIAGNOSIS — E538 Deficiency of other specified B group vitamins: Secondary | ICD-10-CM

## 2023-03-04 DIAGNOSIS — F419 Anxiety disorder, unspecified: Secondary | ICD-10-CM | POA: Insufficient documentation

## 2023-03-04 DIAGNOSIS — B9789 Other viral agents as the cause of diseases classified elsewhere: Secondary | ICD-10-CM | POA: Diagnosis not present

## 2023-03-04 DIAGNOSIS — R002 Palpitations: Secondary | ICD-10-CM | POA: Diagnosis present

## 2023-03-04 LAB — URINALYSIS, ROUTINE W REFLEX MICROSCOPIC
Bilirubin Urine: NEGATIVE
Glucose, UA: NEGATIVE mg/dL
Hgb urine dipstick: NEGATIVE
Ketones, ur: NEGATIVE mg/dL
Leukocytes,Ua: NEGATIVE
Nitrite: NEGATIVE
Protein, ur: NEGATIVE mg/dL
Specific Gravity, Urine: 1.007 (ref 1.005–1.030)
pH: 5.5 (ref 5.0–8.0)

## 2023-03-04 LAB — CBC
HCT: 39.5 % (ref 36.0–46.0)
Hemoglobin: 13.3 g/dL (ref 12.0–15.0)
MCH: 29 pg (ref 26.0–34.0)
MCHC: 33.7 g/dL (ref 30.0–36.0)
MCV: 86.2 fL (ref 80.0–100.0)
Platelets: 346 10*3/uL (ref 150–400)
RBC: 4.58 MIL/uL (ref 3.87–5.11)
RDW: 12.9 % (ref 11.5–15.5)
WBC: 8.5 10*3/uL (ref 4.0–10.5)
nRBC: 0 % (ref 0.0–0.2)

## 2023-03-04 LAB — RESP PANEL BY RT-PCR (RSV, FLU A&B, COVID)  RVPGX2
Influenza A by PCR: NEGATIVE
Influenza B by PCR: NEGATIVE
Resp Syncytial Virus by PCR: NEGATIVE
SARS Coronavirus 2 by RT PCR: NEGATIVE

## 2023-03-04 LAB — GROUP A STREP BY PCR: Group A Strep by PCR: NOT DETECTED

## 2023-03-04 LAB — BASIC METABOLIC PANEL
Anion gap: 10 (ref 5–15)
BUN: 9 mg/dL (ref 6–20)
CO2: 23 mmol/L (ref 22–32)
Calcium: 9.9 mg/dL (ref 8.9–10.3)
Chloride: 104 mmol/L (ref 98–111)
Creatinine, Ser: 0.77 mg/dL (ref 0.44–1.00)
GFR, Estimated: >60 mL/min (ref >60)
Glucose, Bld: 91 mg/dL (ref 70–99)
Potassium: 4 mmol/L (ref 3.5–5.1)
Sodium: 137 mmol/L (ref 135–145)

## 2023-03-04 LAB — TROPONIN I (HIGH SENSITIVITY)
Troponin I (High Sensitivity): <2 ng/L (ref <18)
Troponin I (High Sensitivity): <2 ng/L (ref <18)

## 2023-03-04 LAB — TSH: TSH: 1.327 u[IU]/mL (ref 0.350–4.500)

## 2023-03-04 LAB — D-DIMER, QUANTITATIVE: D-Dimer, Quant: 0.52 ug{FEU}/mL — ABNORMAL HIGH (ref 0.00–0.50)

## 2023-03-04 LAB — PREGNANCY, URINE: Preg Test, Ur: NEGATIVE

## 2023-03-04 MED ORDER — SODIUM CHLORIDE 0.9 % IV BOLUS: 1000.0000 mL | Freq: Once | INTRAVENOUS | Status: DC

## 2023-03-04 MED ORDER — LORAZEPAM 2 MG/ML IJ SOLN
1.0000 mg | Freq: Once | INTRAMUSCULAR | Status: AC
  Administered 2023-03-04: 1 mg via INTRAVENOUS
  Filled 2023-03-04: qty 1

## 2023-03-04 MED ORDER — LORAZEPAM 1 MG PO TABS
1.0000 mg | ORAL_TABLET | Freq: Once | ORAL | Status: AC
Start: 2023-03-04 — End: 2023-03-04
  Administered 2023-03-04: 1 mg via ORAL
  Filled 2023-03-04: qty 1

## 2023-03-04 MED ORDER — HYDROXOCOBALAMIN ACETATE 1000 MCG/ML IM SOLN
1.0000 mL | INTRAMUSCULAR | Status: AC
  Administered 2023-03-04: 1 mL via INTRAMUSCULAR

## 2023-03-04 MED ORDER — LORAZEPAM 2 MG/ML IJ SOLN: 0.5000 mg | Freq: Once | INTRAMUSCULAR | Status: DC

## 2023-03-04 MED ORDER — IOHEXOL 350 MG/ML SOLN
75.0000 mL | Freq: Once | INTRAVENOUS | Status: AC | PRN
  Administered 2023-03-04: 75 mL via INTRAVENOUS

## 2023-03-04 NOTE — Progress Notes (Signed)
Patient is in office today for a nurse visit for B12 Injection, per PCP's order. Patient Injection was given in the  Right deltoid. Patient tolerated injection well.

## 2023-03-04 NOTE — ED Notes (Signed)
Patient transported to X-ray 

## 2023-03-04 NOTE — ED Provider Notes (Signed)
Patient care was taken over from Dr. Particia Nearing pending CTA of her chest.  The CT scan does not show any evidence of blood clot or other acute abnormality.  Her other labs are nonconcerning.  She seems to have symptoms relating to her anxiety.  Has been on the phone with her counselor today.  Will also follow-up with her primary care doctor.  Return precautions were given.   Rolan Bucco, MD 03/04/23 [phone]

## 2023-03-04 NOTE — ED Provider Notes (Signed)
[place] EMERGENCY DEPARTMENT AT [facility] Provider Note   CSN: [id number] Arrival date & time: 03/04/23  1110     History  Chief Complaint  Patient presents with   Palpitations   Sore Throat    Samantha Nguyen is a 37 y.o. female.  Pt is a 37 yo female with pmhx significant for anxiety, depression, and kidney stones.  Pt has had a sore throat all week.  She has also been worried about her health.  She had some blood work done last week which showed some very slightly abnormal numbers.  She was told not to worry about it, but she can't stop worrying and is concerned that she will die.  She developed some palpitations today at work and sx worsened.  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  She is very tearful on exam. Pt has not been taking her anxiety (xanax) medication because she does not want to get hooked on them. She has been taking Paxil, but does not think it has been helping.       Home Medications Prior to Admission medications   Medication Sig Start Date End Date Taking? Authorizing Provider  ALPRAZolam Prudy Feeler) 0.5 MG tablet Take 1 tablet (0.5 mg total) by mouth 2 (two) times daily as needed for sleep or anxiety. 07/27/22 07/27/23  Orson Eva, NP  fexofenadine (ALLEGRA) 180 MG tablet Take 180 mg by mouth daily.    [provider]  Hydroxocobalamin Acetate 1000 MCG/ML SOLN Inject 1 mL into the muscle once a week. 02/11/23   Dulce Sellar, NP  LORazepam (ATIVAN) 1 MG tablet Take 0.5-1 mg by mouth daily as needed. 01/21/23   [provider]  PARoxetine (PAXIL) 20 MG tablet Take 1 tablet (20 mg total) by mouth daily. 01/08/23   Sarina Ill, DO  Probiotic Product (PROBIOTIC-10 PO) Take by mouth.    [provider]      Allergies    Sulfa antibiotics    Review of Systems   Review of Systems  HENT:  Positive for sore throat.   Cardiovascular:  Positive for palpitations.  All other systems reviewed and are  negative.   Physical Exam Updated Vital Signs BP 136/88 (BP Location: Right Arm)   Pulse (!) 116   Temp 98.3 F (36.8 C) (Oral)   Resp 20   Ht 5\' 7"  (1.702 m)   Wt 73.2 kg   LMP 02/18/2023 (Approximate)   SpO2 99%   BMI 25.28 kg/m  Physical Exam Vitals and nursing note reviewed.  Constitutional:      Appearance: She is well-developed.  HENT:     Head: Normocephalic and atraumatic.     Mouth/Throat:     Mouth: Mucous membranes are moist.     Pharynx: Oropharynx is clear.     Tonsils: No tonsillar exudate or tonsillar abscesses.  Eyes:     Conjunctiva/sclera: Conjunctivae normal.     Pupils: Pupils are equal, round, and reactive to light.  Cardiovascular:     Rate and Rhythm: Regular rhythm. Tachycardia present.     Heart sounds: Normal heart sounds.  Pulmonary:     Effort: Pulmonary effort is normal.     Breath sounds: Normal breath sounds.  Abdominal:     General: Bowel sounds are normal.     Palpations: Abdomen is soft.  Musculoskeletal:     Cervical back: Normal range of motion and neck supple.  Skin:    General: Skin is warm.  Capillary Refill: Capillary refill takes less than 2 seconds.  Neurological:     General: No focal deficit present.     Mental Status: She is alert and oriented to person, place, and time.  Psychiatric:        Mood and Affect: Mood is anxious.     ED Results / Procedures / Treatments   Labs (all labs ordered are listed, but only abnormal results are displayed) Labs Reviewed  URINALYSIS, ROUTINE W REFLEX MICROSCOPIC - Abnormal; Notable for the following components:      Result Value   Color, Urine COLORLESS (*)    All other components within normal limits  D-DIMER, QUANTITATIVE - Abnormal; Notable for the following components:   D-Dimer, Quant 0.52 (*)    All other components within normal limits  GROUP A STREP BY PCR  RESP PANEL BY RT-PCR (RSV, FLU A&B, COVID)  [id number]  BASIC METABOLIC PANEL  CBC  PREGNANCY, URINE  TSH   TROPONIN I (HIGH SENSITIVITY)  TROPONIN I (HIGH SENSITIVITY)    EKG EKG Interpretation Date/Time:  Friday March 04 2023 11:29:54 EST Ventricular Rate:  114 PR Interval:  121 QRS Duration:  100 QT Interval:  323 QTC Calculation: 445 R Axis:   72  Text Interpretation: Sinus tachycardia Ventricular premature complex Low voltage, precordial leads No significant change since last tracing Confirmed by Jacalyn Lefevre [phone]) on 03/04/2023 12:16:46 PM  Radiology DG Chest 2 View Result Date: 03/04/2023 CLINICAL DATA:  Tachycardia.  Sore throat. EXAM: CHEST - 2 VIEW COMPARISON:  12/24/2022 FINDINGS: The heart size and mediastinal contours are within normal limits. Both lungs are clear. The visualized skeletal structures are unremarkable. IMPRESSION: No active cardiopulmonary disease. Electronically Signed   By: Jeronimo Greaves M.D.   On: 03/04/2023 13:05    Procedures Procedures    Medications Ordered in ED Medications  LORazepam (ATIVAN) tablet 1 mg (1 mg Oral Given 03/04/23 1233)  LORazepam (ATIVAN) injection 1 mg (1 mg Intravenous Given 03/04/23 1402)  iohexol (OMNIPAQUE) 350 MG/ML injection 75 mL (75 mLs Intravenous Contrast Given 03/04/23 1413)    ED Course/ Medical Decision Making/ A&P                                 Medical Decision Making Amount and/or Complexity of Data Reviewed Labs: ordered. Radiology: ordered.  Risk Prescription drug management.   This patient presents to the ED for concern of palpitations, this involves an extensive number of treatment options, and is a complaint that carries with it a high risk of complications and morbidity.  The differential diagnosis includes cardiac abn, electrolyte abn, anxiety, thyroid prob, infection   Co morbidities that complicate the patient evaluation  anxiety, depression, and kidney stones   Additional history obtained:  Additional history obtained from epic chart review  Lab Tests:  I Ordered, and  personally interpreted labs.  The pertinent results include:  strep neg, covid/flu/rsv neg, urine neg, cbc nl, bmp nl, trop nl, ddimer sl elevated at 0.52, tsh nl   Imaging Studies ordered:  I ordered imaging studies including cxr and ct chest I independently visualized and interpreted imaging which showed  CXR: No active cardiopulmonary disease.  I agree with the radiologist interpretation   Cardiac Monitoring:  The patient was maintained on a cardiac monitor.  I personally viewed and interpreted the cardiac monitored which showed an underlying rhythm of: st   Medicines ordered and prescription drug  management:  I ordered medication including ativan  for anxiety  Reevaluation of the patient after these medicines showed that the patient improved I have reviewed the patients home medicines and have made adjustments as needed   Test Considered:  ct   Critical Interventions:  ativan  Problem List / ED Course:  Palpitations:  I strongly suspect this is from anxiety.  Pt is extremely anxious and tearful.  She had a counselor appt today, but was here.  She is encouraged to f/u with her counselor to try to help with dealing with her anxiety.  Eval here neg.  Pt is stable for d/c.   Reevaluation:  After the interventions noted above, I reevaluated the patient and found that they have :improved   Social Determinants of Health:  Lives at home   Dispostion:  After consideration of the diagnostic results and the patients response to treatment, I feel that the patent would benefit from discharge with outpatient f/u.          Final Clinical Impression(s) / ED Diagnoses Final diagnoses:  Palpitations  Anxiety  Viral pharyngitis    Rx / DC Orders ED Discharge Orders     None         Jacalyn Lefevre, MD 03/04/23 1557

## 2023-03-04 NOTE — ED Triage Notes (Signed)
Pt via pov from home with sore throat since Monday. Pt reports that she has been feeling anxious about her health. She had lab work done at [facility] last week and was told she had low ferritin and high iron but was told not to worry. She is afraid she is going to die. Pt alert & tearful during triage. A&O x 4.

## 2023-03-07 ENCOUNTER — Encounter: Payer: Self-pay | Admitting: Family

## 2023-03-07 ENCOUNTER — Other Ambulatory Visit (HOSPITAL_COMMUNITY): Payer: MEDICAID

## 2023-03-07 NOTE — Telephone Encounter (Signed)
Sorry she is sick over the holiday, and yes being ill, having even just a low grade fever can cause elevated heart rate. All her labs done look fine to me. Hope she is feeling better soon.

## 2023-03-08 ENCOUNTER — Ambulatory Visit: Payer: MEDICAID

## 2023-03-15 ENCOUNTER — Ambulatory Visit: Payer: MEDICAID

## 2023-03-22 ENCOUNTER — Ambulatory Visit: Payer: MEDICAID

## 2023-03-23 ENCOUNTER — Ambulatory Visit: Payer: Self-pay | Admitting: Family

## 2023-03-23 ENCOUNTER — Emergency Department (HOSPITAL_COMMUNITY)
Admission: EM | Admit: 2023-03-23 | Discharge: 2023-03-23 | Disposition: A | Discharge status: Home / Self Care | Payer: MEDICAID | Attending: Emergency Medicine | Admitting: Emergency Medicine

## 2023-03-23 DIAGNOSIS — F419 Anxiety disorder, unspecified: Secondary | ICD-10-CM | POA: Diagnosis present

## 2023-03-23 DIAGNOSIS — R000 Tachycardia, unspecified: Secondary | ICD-10-CM | POA: Diagnosis not present

## 2023-03-23 LAB — CBC WITH DIFFERENTIAL/PLATELET
Abs Immature Granulocytes: 0.03 10*3/uL (ref 0.00–0.07)
Basophils Absolute: 0 10*3/uL (ref 0.0–0.1)
Basophils Relative: 0 %
Eosinophils Absolute: 0 10*3/uL (ref 0.0–0.5)
Eosinophils Relative: 0 %
HCT: 40.2 % (ref 36.0–46.0)
Hemoglobin: 13.4 g/dL (ref 12.0–15.0)
Immature Granulocytes: 0 %
Lymphocytes Relative: 18 %
Lymphs Abs: 1.6 10*3/uL (ref 0.7–4.0)
MCH: 29.1 pg (ref 26.0–34.0)
MCHC: 33.3 g/dL (ref 30.0–36.0)
MCV: 87.4 fL (ref 80.0–100.0)
Monocytes Absolute: 0.5 10*3/uL (ref 0.1–1.0)
Monocytes Relative: 6 %
Neutro Abs: 6.5 10*3/uL (ref 1.7–7.7)
Neutrophils Relative %: 76 %
Platelets: 348 10*3/uL (ref 150–400)
RBC: 4.6 MIL/uL (ref 3.87–5.11)
RDW: 12.5 % (ref 11.5–15.5)
WBC: 8.7 10*3/uL (ref 4.0–10.5)
nRBC: 0 % (ref 0.0–0.2)

## 2023-03-23 LAB — BASIC METABOLIC PANEL
Anion gap: 12 (ref 5–15)
BUN: 9 mg/dL (ref 6–20)
CO2: 21 mmol/L — ABNORMAL LOW (ref 22–32)
Calcium: 9.5 mg/dL (ref 8.9–10.3)
Chloride: 105 mmol/L (ref 98–111)
Creatinine, Ser: 0.85 mg/dL (ref 0.44–1.00)
GFR, Estimated: >60 mL/min (ref >60)
Glucose, Bld: 96 mg/dL (ref 70–99)
Potassium: 3.6 mmol/L (ref 3.5–5.1)
Sodium: 138 mmol/L (ref 135–145)

## 2023-03-23 LAB — HCG, SERUM, QUALITATIVE: Preg, Serum: NEGATIVE

## 2023-03-23 MED ORDER — LORAZEPAM 2 MG/ML IJ SOLN: 2.0000 mg | Freq: Once | INTRAMUSCULAR | Status: DC

## 2023-03-23 MED ORDER — LORAZEPAM 1 MG PO TABS
2.0000 mg | ORAL_TABLET | Freq: Once | ORAL | Status: AC
  Administered 2023-03-23: 2 mg via ORAL
  Filled 2023-03-23: qty 2

## 2023-03-23 NOTE — ED Provider Triage Note (Signed)
 Emergency Medicine Provider Triage Evaluation Note  Samantha Nguyen , a 38 y.o. female  was evaluated in triage.  Pt complains of lightheadedness that started yesterday.  Ended her menstrual cycle 2 days ago.  She stated last longer than usual.  She states she has low iron  and ferritin levels per her PCP has not done anything about these levels.  Review of Systems  Positive: As above Negative: As above  Physical Exam  BP (!) 145/105 (BP Location: Right Arm)   Pulse (!) 103   Temp 98.3 F (36.8 C) (Oral)   Resp 20   LMP 02/18/2023 (Approximate)   SpO2 100%  Gen:   Awake, no distress   Resp:  Normal effort  MSK:   Moves extremities without difficulty  Other:   Medical Decision Making  Medically screening exam initiated at 2:16 PM.  Appropriate orders placed.  Samantha Nguyen was informed that the remainder of the evaluation will be completed by another provider, this initial triage assessment does not replace that evaluation, and the importance of remaining in the ED until their evaluation is complete.     Samantha Loge, PA-C 03/23/23 [phone]

## 2023-03-23 NOTE — ED Triage Notes (Signed)
 Pt states that since yesterday she's been having intermittent dizziness. Denies syncopal episodes. States she is concerned bc she was told she has low iron  but has not been prescribed tx. Pt states she ended her menstrual cycle yesterday. Pt also c/o L flank pain. Denies urinary symptoms, but reports hx of kidney stones

## 2023-03-23 NOTE — ED Provider Notes (Signed)
 [place] EMERGENCY DEPARTMENT AT [facility] Provider Note   CSN: [id number] Arrival date & time: 03/23/23  1357     History  Chief Complaint  Patient presents with   Dizziness    Kristyn Obyrne is a 38 y.o. female.  38 year old female with a history of anxiety and depression who presents emergency department for anxiety.  Patient reports that she recently found out that her iron  levels were abnormal and her B12 levels were abnormal and she started becoming very concerned.  She is worried that she may die.  Says that she has been changing from Paxil  to Lexapro.  She initially was on 20 mg of the Paxil  and now has been decreasing the Paxil  by 5 mg and increasing the Lexapro by 5 mg.  On Monday she took 10 mg of each and has been doing that every day.  Takes these for anxiety and depression.  Said that today she started becoming very anxious and decided to come into the emergency department for evaluation.  No SI or HI or AVH.  No drug use.       Home Medications Prior to Admission medications   Medication Sig Start Date End Date Taking? Authorizing Provider  escitalopram (LEXAPRO) 10 MG tablet  03/21/23   [provider]  fexofenadine (ALLEGRA) 180 MG tablet Take 180 mg by mouth daily.    [provider]  Hydroxocobalamin  Acetate 1000 MCG/ML SOLN Inject 1 mL into the muscle once a week. 02/11/23   Lucius Krabbe, NP  LORazepam  (ATIVAN ) 1 MG tablet Take 0.5-1 mg by mouth daily as needed. 01/21/23   [provider]  PARoxetine  (PAXIL ) 10 MG tablet Take 10 mg by mouth daily.    [provider]  Probiotic Product (PROBIOTIC-10 PO) Take by mouth.    [provider]      Allergies    Sulfa antibiotics    Review of Systems   Review of Systems  Physical Exam Updated Vital Signs BP (!) 128/91   Pulse (!) 118   Temp 98 F (36.7 C)   Resp 15   LMP 02/18/2023 (Approximate)   SpO2 100%  Physical Exam Vitals and nursing note  reviewed.  Constitutional:      General: She is not in acute distress.    Appearance: She is well-developed.     Comments: Very anxious, tearful, tremulous  HENT:     Head: Normocephalic and atraumatic.     Right Ear: External ear normal.     Left Ear: External ear normal.     Nose: Nose normal.  Eyes:     Extraocular Movements: Extraocular movements intact.     Conjunctiva/sclera: Conjunctivae normal.     Pupils: Pupils are equal, round, and reactive to light.  Cardiovascular:     Rate and Rhythm: Regular rhythm. Tachycardia present.     Heart sounds: No murmur heard. Pulmonary:     Effort: Pulmonary effort is normal. No respiratory distress.     Breath sounds: Normal breath sounds.  Musculoskeletal:     Cervical back: Normal range of motion and neck supple.     Right lower leg: No edema.     Left lower leg: No edema.  Skin:    General: Skin is warm and dry.  Neurological:     Mental Status: She is alert and oriented to person, place, and time. Mental status is at baseline.     Comments: No ankle clonus.  Patellar reflexes 2+ bilaterally.  Psychiatric:     Comments: Anxious     ED Results / Procedures / Treatments   Labs (all labs ordered are listed, but only abnormal results are displayed) Labs Reviewed  BASIC METABOLIC PANEL - Abnormal; Notable for the following components:      Result Value   CO2 21 (*)    All other components within normal limits  CBC WITH DIFFERENTIAL/PLATELET  HCG, SERUM, QUALITATIVE    EKG EKG Interpretation Date/Time:  Wednesday March 23 2023 22:51:37 EST Ventricular Rate:  104 PR Interval:  100 QRS Duration:  91 QT Interval:  347 QTC Calculation: 457 R Axis:   96  Text Interpretation: Sinus tachycardia Right atrial enlargement Borderline right axis deviation Artifact in lead(s) I II III aVR aVL Confirmed by Jerral Meth [phone]) on 03/24/2023 11:23:19 AM  Radiology No results found.  Procedures Procedures    Medications  Ordered in ED Medications  LORazepam  (ATIVAN ) tablet 2 mg (2 mg Oral Given 03/23/23 2223)    ED Course/ Medical Decision Making/ A&P                                 Medical Decision Making Risk Prescription drug management.   Niaja Stickley is a 38 y.o. female with comorbidities that complicate the patient evaluation including anxiety and depression who presents emergency department for anxiety.    Initial Ddx:  Panic attack, anxiety, substance use, serotonin syndrome, hyperthyroidism  MDM/Course:  Patient presents to the emergency department with anxiety about her outpatient B12 and iron  tests.  On exam she is very anxious appearing and it took substantial amount of time to console the patient.  She does not appear to be in any acute distress.  When I am out of the room her heart rate is nearly 100 but when I walk in the room her heart rate goes up to 120 to 130 bpm.  Her blood pressure also tends to increase when myself or nurse walk in the room.  She denied any substances.  She did have a TSH on 12/20 which was WNL making thyroid  disease less likely.  Considered serotonin syndrome but at the doses she is taking her medication feel that this is highly unlikely.  Feel that she is likely suffering from a panic attack at this time.  She was given some oral Ativan  and her significant other came to the bedside and upon re-evaluation appeared to have calmed down.  She does have a prescription for Ativan  at home.  Did try to reassure the patient based on her lab work today and I did review some of her outpatient labs with her which did not show any life-threatening or highly concerning findings.  Will have the patient follow-up with her primary doctor in several days.  This patient presents to the ED for concern of complaints listed in HPI, this involves an extensive number of treatment options, and is a complaint that carries with it a high risk of complications and morbidity. Disposition including  potential need for admission considered.   Dispo: DC Home. Return precautions discussed including, but not limited to, those listed in the AVS. Allowed pt time to ask questions which were answered fully prior to dc.  Additional history obtained from significant other Records reviewed Outpatient Clinic Notes The following labs were independently interpreted: Chemistry and show no acute abnormality I personally reviewed and interpreted cardiac monitoring: normal sinus rhythm  I personally reviewed  and interpreted the pt's EKG: see above for interpretation  I have reviewed the patients home medications and made adjustments as needed  Portions of this note were generated with [employer] dictation software. Dictation errors may occur despite best attempts at proofreading.     Final Clinical Impression(s) / ED Diagnoses Final diagnoses:  Anxiety    Rx / DC Orders ED Discharge Orders     None         Yolande Lamar BROCKS, MD 03/25/23 1437

## 2023-03-23 NOTE — Discharge Instructions (Addendum)
 Follow-up with your psychiatrist and primary doctor  Return to the emergency department if you develop any concerning symptoms

## 2023-03-23 NOTE — Telephone Encounter (Signed)
 Chief Complaint: Anxiety Symptoms: Anxiety Frequency: past few weeks Pertinent Negatives: Patient denies trouble sleeping Disposition: [x] ED /[] Urgent Care (no appt availability in office) / [] Appointment(In office/virtual)/ []  [facility]/ [] Home Care/ [] Refused Recommended Disposition /[] [facility]/ []  Follow-up with PCP Additional Notes: Patient called and advised that she has been having a lot of anxiety for the past couple of weeks and her psychologist changed her medications. She was advised by her psychologist that the way she is feeling could be effects of her medication change and that they were okay.  Patient states that she does not want to feel this anxious.  Patient has gotten so upset that she felt lightheaded and dizzy today. She states Monday night she started taking 10mg  of Paxil  and 10mg  of Lexapro.  Patient also states that she usually gets B12 shots and her Ferritin levels and that was low. Patient is very upset and has been feeling very bad for the past week and her psychologist.  Patient saw something on Facebook about B12 and Ferritin making someone feel bad and it scared her so bad and she called 911.  Patient states that she is breathing so hard that she is light headed and is scared that her Vitamin B12 levels are so low that it is dangerous.  Patient was given time to express her emotions, tell in her words what has been going on.  Patient states she is home alone and has tried to call family members but nobody is able to come home to help her.  Patient is very tearful during our conversation.  Patient denies any suicidal thoughts and states she does not want to die.  Patient states that she wants to feel better.  Given that the patient was inconsolable, her expressing wanting help to feel better and to make sure her levels of B12 and Ferritin were okay, patient was advised that the Emergency Room may be the best and safest place for her right now.  Patient  states that she doesn't think she can drive because she feels so light headed now.  She is advised that she shouldn't drive and since nobody else is available at her home,  911 is called.  This RN explained to 911 the situation and they advised that they would send an officer out to her.  This RN asked if this was patent examiner and that she just wants to go to the hospital and there are no dangers involved at this time.  911 advised that it would be someone trained to handle mental health/behavioral health situations.  This RN stayed on the phone with the patient until 911's officer arrived on scene.  Patient told this RN that this person who came is the same person who has helped her in the past.  This RN heard them speaking with the patient and they advised the patient was going to go to [employer].  Reason for Disposition  [1] Lightheadedness or dizziness AND [2] persists > 10 minutes AND [3] not relieved by reassurance provided by triager  Answer Assessment - Initial Assessment Questions 1. CONCERN: Did anything happen that prompted you to call today?      No just a build up 2. ANXIETY SYMPTOMS: Can you describe how you (your loved one; patient) have been feeling? (e.g., tense, restless, panicky, anxious, keyed up, overwhelmed, sense of impending doom).      Down lately 3. ONSET: How long have you been feeling this way? (e.g., hours, days,  weeks)     Past few weeks 4. SEVERITY: How would you rate the level of anxiety? (e.g., 0 - 10; or mild, moderate, severe).     7 or sometimes it can get to a 10 5. FUNCTIONAL IMPAIRMENT: How have these feelings affected your ability to do daily activities? Have you had more difficulty than usual doing your normal daily activities? (e.g., getting better, same, worse; self-care, school, work, interactions)     Having trouble 6. HISTORY: Have you felt this way before? Have you ever been diagnosed with an anxiety problem in the past? (e.g.,  generalized anxiety disorder, panic attacks, PTSD). If Yes, ask: How was this problem treated? (e.g., medicines, counseling, etc.)     Psychologist and has medications 7. RISK OF HARM - SUICIDAL IDEATION: Do you ever have thoughts of hurting or killing yourself? If Yes, ask:  Do you have these feelings now? Do you have a plan on how you would do this?     No 8. TREATMENT:  What has been done so far to treat this anxiety? (e.g., medicines, relaxation strategies). What has helped?     Medications and trying to relax 9. TREATMENT - THERAPIST: Do you have a counselor or therapist? Name?     Yes 10. POTENTIAL TRIGGERS: Do you drink caffeinated beverages (e.g., coffee, colas, teas), and how much daily? Do you drink alcohol or use any drugs? Have you started any new medicines recently?       Patient went from 20mg  of Paxil  down to 10mg  and started tapering on December 27th 11. PATIENT SUPPORT: Who is with you now? Who do you live with? Do you have family or friends who you can talk to?        Home alone 12. OTHER SYMPTOMS: Do you have any other symptoms? (e.g., feeling depressed, trouble concentrating, trouble sleeping, trouble breathing, palpitations or fast heartbeat, chest pain, sweating, nausea, or diarrhea)       Feeling horrible, 13. PREGNANCY: Is there any chance you are pregnant? When was your last menstrual period?       No  Protocols used: Anxiety and Panic Attack-A-AH

## 2023-03-23 NOTE — Progress Notes (Signed)
 Chaplain paged by ED RN to provide support to pt who expressed anxiety and need for support.  Chaplain found pt, Samantha Nguyen, tearful in the ED waiting area and escorted her to a private consultation room. Chaplain introduced spiritual care and offered support as she awaited lab results. Chaplain asked open ended questions to facilitate emotional expression and story telling. Samantha Nguyen shared that she has had considerable anxiety around medical things and has a very specific fear of dying. She believes these symptoms started in 2018 when she was suffering with an infection resulting from Kidney stones that ultimately turned septic. She reports that since then she's had trouble trusting that there isn't something that her medical team is overlooking. She is currently under the care of a psychiatrist and two therapists including one provider of EMDR.  Chaplain utilized reflective listening to identify challenges as well as sources of hope and coping strategies. Chaplain proivded psychoeducation on trauma and fight or flight responses and engaged Samantha Nguyen in therapeutic conversation as well as techniques like mindfulness and affirmations to stabilize her current anxious episode. [name] acknowledges that these strategies are helpful, but difficult for her to access independently when she is in the midst of an anxiety attack. Chaplain facilitated brainstorming to assist in developing strategies to independently call to mind these therapeutic skills, including phone reminders, and also encouraged Samantha Nguyen to discuss spending some time reviewing her Sepsis experience in one of their EMDR sessions with her therapist.   Samantha Nguyen's breathing was less rapid and her movements less fidgety at the end of our time together.   Alan HERO. Davee Lomax, M.Div. [facility] Chaplain Pager [phone] Office [phone]

## 2023-03-23 NOTE — ED Notes (Signed)
 The patient reports anxiety and is tearful and anxious. This RN attempted to speak with her but she is requesting a "patient advoicate." This RN has called and spoke with the Chaplain on call who states she will come down and speak with her.

## 2023-03-23 NOTE — Telephone Encounter (Signed)
 Copied from CRM [id number]. Topic: Clinical - Medical Advice >> Mar 23, 2023  1:08 PM Samuel Jester B wrote: Reason for CRM: sick, f/u on vitamin levels, recently chnaged anti-depression  Pt at ER right now.

## 2023-03-24 ENCOUNTER — Ambulatory Visit: Payer: MEDICAID | Admitting: Family

## 2023-03-24 VITALS — BP 124/82 | HR 86 | Temp 98.4°F | Ht 67.0 in | Wt 148.8 lb

## 2023-03-24 DIAGNOSIS — E538 Deficiency of other specified B group vitamins: Secondary | ICD-10-CM | POA: Diagnosis not present

## 2023-03-24 DIAGNOSIS — F4521 Hypochondriasis: Secondary | ICD-10-CM

## 2023-03-24 DIAGNOSIS — E559 Vitamin D deficiency, unspecified: Secondary | ICD-10-CM

## 2023-03-24 LAB — VITAMIN D 25 HYDROXY (VIT D DEFICIENCY, FRACTURES): VITD: 23.59 ng/mL — ABNORMAL LOW (ref 30.00–100.00)

## 2023-03-24 LAB — VITAMIN B12: Vitamin B-12: 1169 pg/mL — ABNORMAL HIGH (ref 211–911)

## 2023-03-24 NOTE — Progress Notes (Signed)
 Patient ID: Samantha Nguyen, female    DOB: [birth year], 38 y.o.   MRN: [id number]  Chief Complaint  Patient presents with   Fatigue    Pt c/o fatigue, loss of hair and weight loss, Pt would like to discuss.       Discussed the use of AI scribe software for clinical note transcription with the patient, who gave verbal consent to proceed.  History of Present Illness   The patient, with a history of vitamin B12 deficiency, presents with concerns of hair loss and weight loss. She reports that she has been noticing hair loss for a couple of months, with increased shedding when washing her hair. She denies clumps of hair falling out, but notes that she can run her fingers through her hair and pull out three or four strands. She also reports weight loss, which she attributes to stress and reduced appetite, eating only one meal a day. She denies taking any over-the-counter supplements. She has been on a regimen of B12 injections and is due to transition to oral supplements, which she has not yet started. She also expresses concern about her iron  levels, noting that her saturation and iron  levels were high but her ferritin was low. She denies any changes in her thyroid  function, which was recently checked and found to be normal.     Assessment & Plan:     Vitamin B12 Deficiency - Chronic. Low level previously noted. Patient has not yet started oral supplementation. -Administer final B12 injection at home, supplies provided. -Start over-the-counter oral B12 supplementation day after injection, per bottle instructions. -Check B12 levels today and recheck in a couple of months.  Iron  Status - Previous results showed high iron  and saturation levels with slightly low ferritin. -Advised pt that is usually takes 2-3 months to see changes in iron  levels. -Recheck iron  levels in a couple of months.  Vitamin D  Status-  Hx of deficiency, last check normal. Patient has not been taking any over-the-counter  supplements. -Recheck Vitamin D  levels today.  Hair thinning - Patient reports increased hair shedding over the past couple of months. Previous biotin levels were normal. Last TSH wnl 2 mos ago.  -Consider over-the-counter biotin supplementation. -Consider Dermatology referral if continues.  Weight Loss - Patient reports recent weight loss, possibly due to stress or decreased appetite. -Continue to follow with Psych, currently switching meds, will continue to monitor weight. -Monitor weight. If weight loss continues, further evaluation may be needed.  Anxiety - Chronic, Unstable;  Seen in ER yesterday again d/t sx of high anxiety. Pt reports she is the process of switching from Paxil  to Lexapro (10mg ). -Continue current medication regimen per Psych provider & meeting with therapist. -May have to increase to 20mg  of Lexapro if needed after assessing response with Psych.  -Will continue to monitor.    Subjective:    Outpatient Medications Prior to Visit  Medication Sig Dispense Refill   escitalopram (LEXAPRO) 10 MG tablet      fexofenadine (ALLEGRA) 180 MG tablet Take 180 mg by mouth daily.     Hydroxocobalamin  Acetate 1000 MCG/ML SOLN Inject 1 mL into the muscle once a week. 4 mL 0   LORazepam  (ATIVAN ) 1 MG tablet Take 0.5-1 mg by mouth daily as needed.     PARoxetine  (PAXIL ) 10 MG tablet Take 10 mg by mouth daily.     Probiotic Product (PROBIOTIC-10 PO) Take by mouth.     PARoxetine  (PAXIL ) 20 MG tablet Take 1 tablet (20 mg  total) by mouth daily. 30 tablet 3   ALPRAZolam  (XANAX ) 0.5 MG tablet Take 1 tablet (0.5 mg total) by mouth 2 (two) times daily as needed for sleep or anxiety. 10 tablet 0   Facility-Administered Medications Prior to Visit  Medication Dose Route Frequency Provider Last Rate Last Admin   Hydroxocobalamin  Acetate SOLN 1 mL  1 mL Intramuscular Weekly Marletta Bousquet, NP   1 mL at 03/04/23 1714   Past Medical History:  Diagnosis Date   Allergy 1985-09-12   Have  had them my whole life   Anxiety    Chronic kidney disease    Costochondritis 09/02/2022   Depressed    History of kidney stones    MDD (major depressive disorder), recurrent episode, severe (HCC) 01/06/2023   MDD (major depressive disorder), single episode, severe (HCC) 04/28/2022   Migraine    Other chest pain 09/02/2022   Renal disorder    Past Surgical History:  Procedure Laterality Date   CESAREAN SECTION     CYSTOSCOPY W/ URETERAL STENT PLACEMENT Right 08/24/2016   Procedure: CYSTOSCOPY WITH RETROGRADE PYELOGRAM/URETERAL STENT PLACEMENT;  Surgeon: Sherrilee Belvie CROME, MD;  Location: [facility];  Service: Urology;  Laterality: Right;   CYSTOSCOPY WITH RETROGRADE PYELOGRAM, URETEROSCOPY AND STENT PLACEMENT Right 09/09/2016   Procedure: CYSTOSCOPY WITH RETROGRADE PYELOGRAM, URETEROSCOPY AND STENT REPLACEMENT;  Surgeon: Sherrilee Belvie CROME, MD;  Location: [facility];  Service: Urology;  Laterality: Right;   MANDIBLE SURGERY     Allergies  Allergen Reactions   Sulfa Antibiotics Other (See Comments)    Unknown, happened when she was a child      Objective:    Physical Exam Vitals and nursing note reviewed.  Constitutional:      Appearance: Normal appearance. She is not ill-appearing.  Cardiovascular:     Rate and Rhythm: Normal rate and regular rhythm.  Pulmonary:     Effort: Pulmonary effort is normal.     Breath sounds: Normal breath sounds.  Musculoskeletal:        General: Normal range of motion.  Skin:    General: Skin is warm and dry.     Coloration: Skin is pale.  Neurological:     Mental Status: She is alert.  Psychiatric:        Mood and Affect: Mood normal.        Behavior: Behavior normal.    BP 124/82 (BP Location: Left Arm, Patient Position: Sitting, Cuff Size: Large)   Pulse 86   Temp 98.4 F (36.9 C) (Temporal)   Ht 5' 7 (1.702 m)   Wt 148 lb 12.8 oz (67.5 kg)   LMP 03/07/2023 (Approximate)   SpO2 100%   BMI 23.31 kg/m  Wt  Readings from Last 3 Encounters:  03/24/23 148 lb 12.8 oz (67.5 kg)  03/04/23 161 lb 6 oz (73.2 kg)  02/16/23 161 lb 6.4 oz (73.2 kg)      Lucius Krabbe, NP

## 2023-03-24 NOTE — Assessment & Plan Note (Signed)
 Seen in ER yesterday again d/t sx of high anxiety. Pt reports she is the process of switching from Paxil  to Lexapro (10mg ). -Continue current medication regimen per Psych provider, and meeting with therapist. -May have to increase to 20mg  of Lexapro if needed after assessing response with Psych.  -Will continue to monitor.

## 2023-03-24 NOTE — Assessment & Plan Note (Signed)
 Borderline levels previously noted. Patient has not yet started oral supplementation. -Administer final B12 injection. -Start over-the-counter oral B12 supplementation. -Check B12 levels now and recheck in a couple of months.

## 2023-03-25 MED ORDER — VITAMIN D3 1.25 MG (50000 UT) PO CAPS: 1.0000 | ORAL_CAPSULE | ORAL | 0 refills | Status: DC

## 2023-03-25 NOTE — Addendum Note (Signed)
 Addended byDulce Sellar on: 03/25/2023 05:21 PM   Modules accepted: Orders

## 2023-03-26 LAB — UNMAPPED LAB RESULTS
Basophil # (HT): 0.1 10 3/uL (ref 0.0–0.2)
Basophil % (HT): 1 % (ref 0–3)
Eosinophil # (HT): 0.1 10 3/uL (ref 0.0–0.6)
Eosinophil % (HT): 1 % (ref 0–5)
Hematocrit (HT): 28 % — ABNORMAL LOW (ref 35–47)
Hemoglobin (HGB) (HT): 7.4 g/dL — ABNORMAL LOW (ref 12.0–16.0)
Lymphocyte # (HT): 2.5 10 3/uL (ref 1.0–4.8)
Lymphocyte % (HT): 17 % (ref 15–45)
MCHC (HT): 26.1 g/dL — ABNORMAL LOW (ref 31.0–37.5)
MCV (HT): 76 fL — ABNORMAL LOW (ref 80–100)
Mean Corpuscular Hemoglobin (MCH) (HT): 19.7 pg — ABNORMAL LOW (ref 26.0–34.0)
Monocyte # (HT): 0.9 10 3/uL (ref 0.1–1.0)
Monocyte % (HT): 6 % (ref 0–15)
Neutrophil # (HT): 11.1 10 3/uL — ABNORMAL HIGH (ref 1.8–8.0)
Platelets (HT): 388 10 3/uL (ref 150–450)
RBC (HT): 3.75 10 6/uL — ABNORMAL LOW (ref 3.80–5.20)
RDW (HT): 18.5 % — ABNORMAL HIGH (ref 0.0–15.2)
Seg Neut % (HT): 75 % (ref 45–75)
WBC (HT): 14.8 10 3/uL — ABNORMAL HIGH (ref 4.0–11.0)

## 2023-03-28 ENCOUNTER — Encounter: Payer: Self-pay | Admitting: Family

## 2023-03-31 ENCOUNTER — Telehealth: Payer: MEDICAID | Admitting: Family Medicine

## 2023-03-31 ENCOUNTER — Telehealth: Payer: MEDICAID

## 2023-03-31 DIAGNOSIS — R109 Unspecified abdominal pain: Secondary | ICD-10-CM

## 2023-03-31 NOTE — Progress Notes (Signed)
Samantha Nguyen   Patient has possible kidney stones. Needs in person assessment for verification and treatment. Needs to be seen in person given the higher risk of stone and kidney infections.  Patient acknowledged agreement and understanding of the plan.

## 2023-04-05 ENCOUNTER — Emergency Department (HOSPITAL_COMMUNITY): Payer: MEDICAID

## 2023-04-05 ENCOUNTER — Other Ambulatory Visit: Payer: Self-pay

## 2023-04-05 ENCOUNTER — Encounter (HOSPITAL_COMMUNITY): Payer: Self-pay

## 2023-04-05 ENCOUNTER — Emergency Department (HOSPITAL_COMMUNITY)
Admission: EM | Admit: 2023-04-05 | Discharge: 2023-04-05 | Disposition: A | Discharge status: Home / Self Care | Payer: MEDICAID | Attending: Emergency Medicine | Admitting: Emergency Medicine

## 2023-04-05 DIAGNOSIS — F419 Anxiety disorder, unspecified: Secondary | ICD-10-CM | POA: Insufficient documentation

## 2023-04-05 DIAGNOSIS — R197 Diarrhea, unspecified: Secondary | ICD-10-CM | POA: Insufficient documentation

## 2023-04-05 DIAGNOSIS — Z20822 Contact with and (suspected) exposure to covid-19: Secondary | ICD-10-CM | POA: Diagnosis not present

## 2023-04-05 DIAGNOSIS — R002 Palpitations: Secondary | ICD-10-CM | POA: Diagnosis not present

## 2023-04-05 DIAGNOSIS — N200 Calculus of kidney: Secondary | ICD-10-CM

## 2023-04-05 DIAGNOSIS — R112 Nausea with vomiting, unspecified: Secondary | ICD-10-CM | POA: Insufficient documentation

## 2023-04-05 LAB — CBC
HCT: 43.1 % (ref 36.0–46.0)
Hemoglobin: 14.3 g/dL (ref 12.0–15.0)
MCH: 28.9 pg (ref 26.0–34.0)
MCHC: 33.2 g/dL (ref 30.0–36.0)
MCV: 87.2 fL (ref 80.0–100.0)
Platelets: 352 10*3/uL (ref 150–400)
RBC: 4.94 MIL/uL (ref 3.87–5.11)
RDW: 12.7 % (ref 11.5–15.5)
WBC: 12.9 10*3/uL — ABNORMAL HIGH (ref 4.0–10.5)
nRBC: 0 % (ref 0.0–0.2)

## 2023-04-05 LAB — RESP PANEL BY RT-PCR (RSV, FLU A&B, COVID)  RVPGX2
Influenza A by PCR: NEGATIVE
Influenza B by PCR: NEGATIVE
Resp Syncytial Virus by PCR: NEGATIVE
SARS Coronavirus 2 by RT PCR: NEGATIVE

## 2023-04-05 LAB — URINALYSIS, ROUTINE W REFLEX MICROSCOPIC
Bacteria, UA: NONE SEEN
Bilirubin Urine: NEGATIVE
Glucose, UA: NEGATIVE mg/dL
Ketones, ur: NEGATIVE mg/dL
Leukocytes,Ua: NEGATIVE
Nitrite: NEGATIVE
Protein, ur: NEGATIVE mg/dL
Specific Gravity, Urine: 1.004 — ABNORMAL LOW (ref 1.005–1.030)
pH: 6 (ref 5.0–8.0)

## 2023-04-05 LAB — LIPASE, BLOOD: Lipase: 32 U/L (ref 11–51)

## 2023-04-05 LAB — COMPREHENSIVE METABOLIC PANEL
ALT: 17 U/L (ref 0–44)
AST: 19 U/L (ref 15–41)
Albumin: 4.5 g/dL (ref 3.5–5.0)
Alkaline Phosphatase: 53 U/L (ref 38–126)
Anion gap: 12 (ref 5–15)
BUN: 13 mg/dL (ref 6–20)
CO2: 20 mmol/L — ABNORMAL LOW (ref 22–32)
Calcium: 9.6 mg/dL (ref 8.9–10.3)
Chloride: 105 mmol/L (ref 98–111)
Creatinine, Ser: 0.88 mg/dL (ref 0.44–1.00)
GFR, Estimated: >60 mL/min (ref >60)
Glucose, Bld: 110 mg/dL — ABNORMAL HIGH (ref 70–99)
Potassium: 3.9 mmol/L (ref 3.5–5.1)
Sodium: 137 mmol/L (ref 135–145)
Total Bilirubin: 0.8 mg/dL (ref 0.0–1.2)
Total Protein: 7.8 g/dL (ref 6.5–8.1)

## 2023-04-05 LAB — HCG, SERUM, QUALITATIVE: Preg, Serum: NEGATIVE

## 2023-04-05 MED ORDER — ACETAMINOPHEN 325 MG PO TABS
325.0000 mg | ORAL_TABLET | Freq: Once | ORAL | Status: AC
  Administered 2023-04-05: 325 mg via ORAL
  Filled 2023-04-05: qty 1

## 2023-04-05 MED ORDER — MORPHINE SULFATE (PF) 4 MG/ML IV SOLN
4.0000 mg | Freq: Once | INTRAVENOUS | Status: AC
  Administered 2023-04-05: 4 mg via INTRAVENOUS
  Filled 2023-04-05: qty 1

## 2023-04-05 MED ORDER — LACTATED RINGERS IV BOLUS
1000.0000 mL | Freq: Once | INTRAVENOUS | Status: AC
  Administered 2023-04-05: 1000 mL via INTRAVENOUS

## 2023-04-05 MED ORDER — ACETAMINOPHEN 500 MG PO TABS
1000.0000 mg | ORAL_TABLET | Freq: Once | ORAL | Status: AC
  Administered 2023-04-05: 1000 mg via ORAL
  Filled 2023-04-05: qty 2

## 2023-04-05 MED ORDER — LORAZEPAM 2 MG/ML IJ SOLN
1.0000 mg | Freq: Once | INTRAMUSCULAR | Status: AC
  Administered 2023-04-05: 1 mg via INTRAVENOUS
  Filled 2023-04-05: qty 1

## 2023-04-05 MED ORDER — ONDANSETRON 4 MG PO TBDP
4.0000 mg | ORAL_TABLET | Freq: Once | ORAL | Status: DC | PRN
  Filled 2023-04-05: qty 1

## 2023-04-05 MED ORDER — LACTATED RINGERS IV SOLN: INTRAVENOUS | Status: AC

## 2023-04-05 NOTE — ED Notes (Signed)
PT is currently in distress after looking at her mychart results.PT is crying uncontrollably at this time

## 2023-04-05 NOTE — ED Provider Notes (Signed)
[place] EMERGENCY DEPARTMENT AT [facility] Provider Note   CSN: [id number] Arrival date & time: 04/05/23  4098     History  Chief Complaint  Patient presents with   Emesis    Samantha Nguyen is a 38 y.o. female.  Samantha Nguyen is a 38 y.o. female with a history of chronic kidney disease, anxiety, depression and migraines initially coming in because she thinks she was having a reaction to her doxepin.  She started it this morning around 4 AM when she was having inability to sleep and reports that she woke up not long after and was having feelings of panic like she might die with her heart beating rapidly and having nausea and vomiting.  She reports she does not feel well at all.  She continues to have nausea and dry heaving.  She is also complaining of feeling palpitations.  She denies thinking that she is pregnant and is unsure if she has had fever.  She denies any cough or congestion.  No feeling of her throat swelling shut.  The history is provided by the patient.  Emesis      Home Medications Prior to Admission medications   Medication Sig Start Date End Date Taking? Authorizing Provider  Cholecalciferol (VITAMIN D3) 1.25 MG (50000 UT) CAPS Take 1 capsule (1.25 mg total) by mouth once a week. AFTER the last weekly pill, start a daily OTC Vitamin D3 up to 4,000units. 03/25/23   Dulce Sellar, NP  escitalopram (LEXAPRO) 10 MG tablet  03/21/23   [provider]  fexofenadine (ALLEGRA) 180 MG tablet Take 180 mg by mouth daily.    [provider]  Hydroxocobalamin Acetate 1000 MCG/ML SOLN Inject 1 mL into the muscle once a week. 02/11/23   Dulce Sellar, NP  LORazepam (ATIVAN) 1 MG tablet Take 0.5-1 mg by mouth daily as needed. 01/21/23   [provider]  LOREEV XR 1.5 MG CS24 Take 1 capsule by mouth daily. 03/28/23   [provider]  PARoxetine (PAXIL) 10 MG tablet Take 5 mg by mouth daily.    [provider]  Probiotic  Product (PROBIOTIC-10 PO) Take by mouth.    [provider]      Allergies    Sulfa antibiotics    Review of Systems   Review of Systems  Gastrointestinal:  Positive for vomiting.    Physical Exam Updated Vital Signs BP 117/84   Pulse (!) 127   Temp 99.5 F (37.5 C)   Resp 16   Ht 5\' 7"  (1.702 m)   Wt 66.7 kg   LMP 03/07/2023 (Approximate)   SpO2 100%   BMI 23.02 kg/m  Physical Exam Vitals and nursing note reviewed.  Constitutional:      General: She is not in acute distress.    Appearance: She is well-developed.     Comments: Anxious, hyperventilating, tearful  HENT:     Head: Normocephalic and atraumatic.     Mouth/Throat:     Mouth: Mucous membranes are dry.  Eyes:     Conjunctiva/sclera: Conjunctivae normal.     Pupils: Pupils are equal, round, and reactive to light.  Cardiovascular:     Rate and Rhythm: Regular rhythm. Tachycardia present.     Heart sounds: No murmur heard. Pulmonary:     Effort: Pulmonary effort is normal. No respiratory distress.     Breath sounds: Normal breath sounds. No wheezing or rales.  Abdominal:     General: There is no distension.  Palpations: Abdomen is soft.     Tenderness: There is no abdominal tenderness. There is no guarding or rebound.  Musculoskeletal:        General: No tenderness. Normal range of motion.     Cervical back: Normal range of motion and neck supple.     Right lower leg: No edema.     Left lower leg: No edema.  Skin:    General: Skin is warm and dry.     Findings: No erythema or rash.  Neurological:     Mental Status: She is alert and oriented to person, place, and time. Mental status is at baseline.  Psychiatric:        Behavior: Behavior normal.     ED Results / Procedures / Treatments   Labs (all labs ordered are listed, but only abnormal results are displayed) Labs Reviewed  COMPREHENSIVE METABOLIC PANEL - Abnormal; Notable for the following components:      Result Value   CO2 20  (*)    Glucose, Bld 110 (*)    All other components within normal limits  CBC - Abnormal; Notable for the following components:   WBC 12.9 (*)    All other components within normal limits  RESP PANEL BY RT-PCR (RSV, FLU A&B, COVID)  RVPGX2  LIPASE, BLOOD  HCG, SERUM, QUALITATIVE  URINALYSIS, ROUTINE W REFLEX MICROSCOPIC    EKG EKG Interpretation Date/Time:  Tuesday April 05 2023 09:15:01 EST Ventricular Rate:  136 PR Interval:  120 QRS Duration:  74 QT Interval:  306 QTC Calculation: 460 R Axis:   88  Text Interpretation: Sinus tachycardia Otherwise normal ECG When compared with ECG of 23-Mar-2023 22:51, PREVIOUS ECG IS PRESENT No significant change since last tracing Confirmed by Gwyneth Sprout ([id number]) on 04/05/2023 9:29:07 AM  Radiology No results found.  Procedures Procedures    Medications Ordered in ED Medications  ondansetron (ZOFRAN-ODT) disintegrating tablet 4 mg (4 mg Oral Patient Refused/Not Given 04/05/23 0925)  lactated ringers infusion ( Intravenous New Bag/Given 04/05/23 1351)  LORazepam (ATIVAN) injection 1 mg (1 mg Intravenous Given 04/05/23 0926)  lactated ringers bolus 1,000 mL (0 mLs Intravenous Stopped 04/05/23 1205)  LORazepam (ATIVAN) injection 1 mg (1 mg Intravenous Given 04/05/23 1114)  acetaminophen (TYLENOL) tablet 1,000 mg (1,000 mg Oral Given 04/05/23 1225)    ED Course/ Medical Decision Making/ A&P                                 Medical Decision Making Amount and/or Complexity of Data Reviewed Labs: ordered. Radiology: ordered.  Risk OTC drugs. Prescription drug management.   Pt with multiple medical problems and comorbidities and presenting today with a complaint that caries a high risk for morbidity and mortality.  Here today with the above complaints.  Patient has initial vital signs with normal blood pressure but elevated heart rate in the 130s.  Also with nausea vomiting and diarrhea.  Concern for viral etiology versus serotonin  syndrome related to recent addition of doxepin to her psych regime.  Lower suspicion for PE, MI, pneumothorax, or hyperthyroidism.  Patient was seen 2 weeks ago and at that time heart rate was 118 with similar symptoms of being fearful of dying.  TSH in December was normal. On numerous visits even prior to 8 January patient has always been tachycardic between 100-120.  I independently interpreted patient's EKG which shows a sinus tachycardia today without evidence of SVT  or dysrhythmia.  I independently interpreted patient's labs and CBC with minimal leukocytosis of 12 with a stable hemoglobin, respiratory viral panel is negative, pregnancy test is negative, CMP without acute findings today and lipase is normal. 3:28 PM On reevaluation now patient is tearful and reports she is having significant pain in her left side area.  She now states that since Thursday of last week she was having some intermittent kidney pain and she has a history of kidney stones.  She does have a prior history of sepsis related to a retained kidney stone.  With patient's temperature of 99.5, now location of pain discussed needing to evaluate for stone and UTI as well as pyelonephritis.  Patient is requesting an ultrasound of the kidneys initially as she has had several CAT scans in the past.  She was also given pain medication.        Final Clinical Impression(s) / ED Diagnoses Final diagnoses:  None    Rx / DC Orders ED Discharge Orders     None         Gwyneth Sprout, MD 04/05/23 [phone]

## 2023-04-05 NOTE — Discharge Instructions (Addendum)
You were seen in the emergency department for nausea and shaking Your urine did show some blood in the urine and we saw some kidney stones but they are nonobstructing and there is no swelling of your kidneys or severe infection at this time It is important that you follow-up with your primary care doctor and mental health provider to discuss medication management in the future Return to the emergency department for any recurrent or worsening symptoms

## 2023-04-05 NOTE — ED Triage Notes (Addendum)
Pt states she took a new medication this am, doxepin 6mg , and since then she has been nauseated and shaky. Pt also c/o abdominal pain and diarrhea.

## 2023-04-05 NOTE — ED Provider Notes (Signed)
  Physical Exam  BP 110/88 (BP Location: Right Arm)   Pulse (!) 110   Temp 100 F (37.8 C) (Oral)   Resp 20   Ht 5\' 7"  (1.702 m)   Wt 66.7 kg   LMP 03/07/2023 (Approximate)   SpO2 100%   BMI 23.02 kg/m   Physical Exam Vitals and nursing note reviewed.  HENT:     Head: Normocephalic and atraumatic.  Eyes:     Pupils: Pupils are equal, round, and reactive to light.  Cardiovascular:     Rate and Rhythm: Normal rate and regular rhythm.  Pulmonary:     Effort: Pulmonary effort is normal.     Breath sounds: Normal breath sounds.  Abdominal:     Palpations: Abdomen is soft.     Tenderness: There is no abdominal tenderness.  Skin:    General: Skin is warm and dry.  Neurological:     Mental Status: She is alert.  Psychiatric:        Mood and Affect: Mood normal.     Procedures  Procedures  ED Course / MDM   Clinical Course as of 04/05/23 2144  Tue Apr 05, 2023  1940 UA shows no evidence of infection but does show some hematuria [MP]  1941 Ultrasound shows no evidence of hydronephrosis with persistent left-sided stones.  Considering the leukocytosis of 12.9, patient is worried about another potentially severe episode of pyelonephrosis leading to sepsis as she has had in the past.  We talked about the rationale for a CT scan to better evaluate and she does want a CT scan here tonight.  Will order CT abdomen pelvis without contrast renal study [MP]  2142 CT shows bilateral nonobstructing nephrolithiasis without hydronephrosis.  Patient resting comfortably at this time.  Stable for discharge.  She will follow-up with her previous urologist if her symptoms persist and her primary care providers and mental health providers to discuss medication management [MP]    Clinical Course User Index [MP] Royanne Foots, DO   Medical Decision Making I, Estelle June DO, have assumed care of this patient from the previous provider pending UA, ultrasound, reevaluation and  disposition  Amount and/or Complexity of Data Reviewed Labs: ordered. Radiology: ordered.  Risk OTC drugs. Prescription drug management.   Final diagnosis Nephrolithiasis       Royanne Foots, DO 04/05/23 2144

## 2023-04-05 NOTE — ED Provider Triage Note (Signed)
Emergency Medicine Provider Triage Evaluation Note  Samantha Nguyen , a 38 y.o. female  was evaluated in triage.  Pt complains of initially coming in because she thinks she was having a reaction to her doxepin.  She started it this morning around 4 AM when she was having inability to sleep and reports that she woke up not long after and was having feelings of panic like she might die with her heart beating rapidly and having nausea and vomiting.  She reports she does not feel well at all.  Review of Systems  Positive: Palpitations, nausea vomiting, chills, diarrhea and headache, shortness of breath Negative: No lower extremity swelling or rash.  Physical Exam  BP 121/82   Pulse (!) 140   Temp 99.5 F (37.5 C)   Resp 17   Ht 5\' 7"  (1.702 m)   Wt 66.7 kg   LMP 03/07/2023 (Approximate)   SpO2 100%   BMI 23.02 kg/m  Gen:   Awake, anxious Resp:  Normal effort  MSK:   Moves extremities without difficulty  Other:  Cardiac exam with regular tachycardia.  Patient seems distraught and anxious  Medical Decision Making  Medically screening exam initiated at 2:06 PM.  Appropriate orders placed.  Samantha Nguyen was informed that the remainder of the evaluation will be completed by another provider, this initial triage assessment does not replace that evaluation, and the importance of remaining in the ED until their evaluation is complete.  Concern for possible reaction to the doxepin, possible serotonin syndrome, unrelated cause such as a GI illness.  Patient is in a sinus tachycardia without evidence of SVT   Gwyneth Sprout, MD 04/05/23 1408

## 2023-04-06 ENCOUNTER — Telehealth: Payer: MEDICAID | Admitting: Nurse Practitioner

## 2023-04-06 DIAGNOSIS — R509 Fever, unspecified: Secondary | ICD-10-CM

## 2023-04-06 NOTE — Progress Notes (Signed)
Samantha Nguyen,  It is not clear from your ER reports what the plan was at time of discharge/ I do not see a discharge note.   Given all the things that are happening I would recommend returning to the ER for a complete plan of care at this time  Based on what you shared with me, I feel your condition warrants further evaluation as soon as possible at an Emergency department.    NOTE: There will be NO CHARGE for this eVisit   If you are having a true medical emergency please call 911.      Emergency Department-[facility]  Get Driving Directions  [phone]  [address]  [place], [name] [address]  Open 24/7/365      [facility] Emergency Department at [facility]  Get Driving Directions  [address]  [place], [name] [address]  Open 24/7/365    Emergency Department- [facility]  Get Driving Directions  [phone]  [address]  [place], [name] [address]  Open 24/7/365      [facility] at [facility]  Get Driving Directions  [phone]  [address]  [place], [name] [address]  Open 24/7/365    [facility]  Emergency Department- [facility]  Get Driving Directions  [phone]  [address]  [place], [name] [address]  Open 24/7/365    [place]  Emergency Department- [facility]  Get Driving Directions  [address]  [place], [name] [address]  Open 24/7/365    [facility]  Emergency Department- [facility]  Get Driving Directions  [phone]  [address]  [place], [name] [address]  Open 24/7/365

## 2023-04-07 ENCOUNTER — Emergency Department (HOSPITAL_COMMUNITY)
Admission: EM | Admit: 2023-04-07 | Discharge: 2023-04-07 | Disposition: A | Discharge status: Home / Self Care | Payer: MEDICAID | Attending: Emergency Medicine | Admitting: Emergency Medicine

## 2023-04-07 ENCOUNTER — Emergency Department (HOSPITAL_COMMUNITY): Payer: MEDICAID

## 2023-04-07 ENCOUNTER — Encounter: Payer: Self-pay | Admitting: Family

## 2023-04-07 ENCOUNTER — Encounter (HOSPITAL_COMMUNITY): Payer: Self-pay

## 2023-04-07 ENCOUNTER — Other Ambulatory Visit: Payer: Self-pay

## 2023-04-07 DIAGNOSIS — F419 Anxiety disorder, unspecified: Secondary | ICD-10-CM | POA: Insufficient documentation

## 2023-04-07 DIAGNOSIS — R109 Unspecified abdominal pain: Secondary | ICD-10-CM | POA: Diagnosis present

## 2023-04-07 LAB — BASIC METABOLIC PANEL
Anion gap: 10 (ref 5–15)
BUN: 7 mg/dL (ref 6–20)
CO2: 22 mmol/L (ref 22–32)
Calcium: 9.4 mg/dL (ref 8.9–10.3)
Chloride: 109 mmol/L (ref 98–111)
Creatinine, Ser: 0.82 mg/dL (ref 0.44–1.00)
GFR, Estimated: >60 mL/min (ref >60)
Glucose, Bld: 108 mg/dL — ABNORMAL HIGH (ref 70–99)
Potassium: 3.3 mmol/L — ABNORMAL LOW (ref 3.5–5.1)
Sodium: 141 mmol/L (ref 135–145)

## 2023-04-07 LAB — URINALYSIS, ROUTINE W REFLEX MICROSCOPIC
Bacteria, UA: NONE SEEN
Bilirubin Urine: NEGATIVE
Glucose, UA: NEGATIVE mg/dL
Ketones, ur: NEGATIVE mg/dL
Leukocytes,Ua: NEGATIVE
Nitrite: NEGATIVE
Protein, ur: NEGATIVE mg/dL
Specific Gravity, Urine: 1.016 (ref 1.005–1.030)
pH: 5 (ref 5.0–8.0)

## 2023-04-07 LAB — CBC WITH DIFFERENTIAL/PLATELET
Abs Immature Granulocytes: 0.03 10*3/uL (ref 0.00–0.07)
Basophils Absolute: 0 10*3/uL (ref 0.0–0.1)
Basophils Relative: 0 %
Eosinophils Absolute: 0.1 10*3/uL (ref 0.0–0.5)
Eosinophils Relative: 1 %
HCT: 39.7 % (ref 36.0–46.0)
Hemoglobin: 13.2 g/dL (ref 12.0–15.0)
Immature Granulocytes: 1 %
Lymphocytes Relative: 23 %
Lymphs Abs: 1.4 10*3/uL (ref 0.7–4.0)
MCH: 28.8 pg (ref 26.0–34.0)
MCHC: 33.2 g/dL (ref 30.0–36.0)
MCV: 86.7 fL (ref 80.0–100.0)
Monocytes Absolute: 0.6 10*3/uL (ref 0.1–1.0)
Monocytes Relative: 9 %
Neutro Abs: 4.1 10*3/uL (ref 1.7–7.7)
Neutrophils Relative %: 66 %
Platelets: 303 10*3/uL (ref 150–400)
RBC: 4.58 MIL/uL (ref 3.87–5.11)
RDW: 12.7 % (ref 11.5–15.5)
WBC: 6.1 10*3/uL (ref 4.0–10.5)
nRBC: 0 % (ref 0.0–0.2)

## 2023-04-07 LAB — HCG, QUANTITATIVE, PREGNANCY: hCG, Beta Chain, Quant, S: <1 m[IU]/mL (ref <5)

## 2023-04-07 MED ORDER — HYDROMORPHONE HCL 1 MG/ML IJ SOLN
1.0000 mg | Freq: Once | INTRAMUSCULAR | Status: DC
Start: 2023-04-07 — End: 2023-04-07
  Filled 2023-04-07: qty 1

## 2023-04-07 MED ORDER — IOHEXOL 350 MG/ML SOLN
60.0000 mL | Freq: Once | INTRAVENOUS | Status: AC | PRN
  Administered 2023-04-07: 60 mL via INTRAVENOUS

## 2023-04-07 MED ORDER — LORAZEPAM 2 MG/ML IJ SOLN
1.0000 mg | Freq: Once | INTRAMUSCULAR | Status: AC
  Administered 2023-04-07: 1 mg via INTRAVENOUS
  Filled 2023-04-07: qty 1

## 2023-04-07 MED ORDER — ONDANSETRON HCL 4 MG/2ML IJ SOLN
4.0000 mg | Freq: Once | INTRAMUSCULAR | Status: DC
  Filled 2023-04-07: qty 2

## 2023-04-07 MED ORDER — KETOROLAC TROMETHAMINE 30 MG/ML IJ SOLN
30.0000 mg | Freq: Once | INTRAMUSCULAR | Status: DC
Start: 2023-04-07 — End: 2023-04-07
  Filled 2023-04-07: qty 1

## 2023-04-07 NOTE — ED Notes (Signed)
Pt continues to cry and is now asking for something for "chapped lips"

## 2023-04-07 NOTE — ED Notes (Signed)
Pt ambulating to the bathroom. Pt is still crying and very anxious

## 2023-04-07 NOTE — ED Provider Notes (Signed)
[place] EMERGENCY DEPARTMENT AT [facility] Provider Note   CSN: [id number] Arrival date & time: 04/07/23  1140     History  Chief Complaint  Patient presents with   Abdominal Pain   Flank Pain    Albany Kosar is a 38 y.o. female.  This is a 38 year old female who is here today for multiple complaints.  Patient is concerned that she has sepsis, has serotonin syndrome, is endorsing nausea, vomiting, is concerned about incidental findings on previous imaging including nonobstructive stone, dual collecting duct system on renal ultrasound, spondylolysis on CT imaging, is concerned that her blood pressure was normal when she was feeling anxious and usually it is higher when she is anxious.   Abdominal Pain Flank Pain Associated symptoms include abdominal pain.       Home Medications Prior to Admission medications   Medication Sig Start Date End Date Taking? Authorizing Provider  Cholecalciferol (VITAMIN D3) 1.25 MG (50000 UT) CAPS Take 1 capsule (1.25 mg total) by mouth once a week. AFTER the last weekly pill, start a daily OTC Vitamin D3 up to 4,000units. 03/25/23   Dulce Sellar, NP  Cyanocobalamin (B-12) 1000 MCG SUBL Place 1,000 mcg under the tongue daily.    [provider]  diphenhydrAMINE (BENADRYL) 25 mg capsule Take 25 mg by mouth at bedtime as needed for sleep.    [provider]  Doxepin HCl 6 MG TABS Take 6 mg by mouth at bedtime.    [provider]  escitalopram (LEXAPRO) 10 MG tablet Take 15 mg by mouth daily. Take one 5mg  tablet with one 10mg  tablet by mouth once a day for total dose of 15mg . 03/21/23   [provider]  escitalopram (LEXAPRO) 5 MG tablet Take 15 mg by mouth daily. Take one 5mg  tablet with one 10mg  tablet by mouth once a day for total dose of 15mg .    [provider]  LORazepam (ATIVAN) 1 MG tablet Take 0.5-1 mg by mouth daily as needed. 01/21/23   [provider]  LOREEV XR 1.5 MG CS24  Take 1.5 mg by mouth at bedtime. 03/28/23   [provider]  PARoxetine (PAXIL) 10 MG tablet Take 2.5 mg by mouth daily.    [provider]  Probiotic Product (PROBIOTIC-10 PO) Take 1 capsule by mouth daily.    [provider]      Allergies    Sulfa antibiotics    Review of Systems   Review of Systems  Gastrointestinal:  Positive for abdominal pain.  Genitourinary:  Positive for flank pain.    Physical Exam Updated Vital Signs BP (!) 127/93   Pulse (!) 102   Temp 98.2 F (36.8 C)   Resp 16   LMP 03/07/2023 (Approximate)   SpO2 100%  Physical Exam Vitals reviewed.  Cardiovascular:     Heart sounds: Normal heart sounds. No murmur heard. Abdominal:     General: Abdomen is flat. Bowel sounds are normal.     Palpations: Abdomen is soft.  Genitourinary:    Adnexa: Right adnexa normal and left adnexa normal.  Skin:    General: Skin is warm.  Neurological:     General: No focal deficit present.     Mental Status: She is alert.  Psychiatric:        Mood and Affect: Mood is anxious.     ED Results / Procedures / Treatments   Labs (all labs ordered are listed, but only abnormal results are displayed) Labs Reviewed  BASIC METABOLIC PANEL - Abnormal; Notable for the following components:      Result Value   Potassium 3.3 (*)    Glucose, Bld 108 (*)    All other components within normal limits  URINALYSIS, ROUTINE W REFLEX MICROSCOPIC - Abnormal; Notable for the following components:   Hgb urine dipstick SMALL (*)    All other components within normal limits  CBC WITH DIFFERENTIAL/PLATELET  HCG, QUANTITATIVE, PREGNANCY    EKG None  Radiology CT ABDOMEN PELVIS W CONTRAST Result Date: 04/07/2023 CLINICAL DATA:  Abdominal and left flank pain. Diagnosed with kidney stones two days ago. EXAM: CT ABDOMEN AND PELVIS WITH CONTRAST TECHNIQUE: Multidetector CT imaging of the abdomen and pelvis was performed using the standard protocol following bolus  administration of intravenous contrast. RADIATION DOSE REDUCTION: This exam was performed according to the departmental dose-optimization program which includes automated exposure control, adjustment of the mA and/or kV according to patient size and/or use of iterative reconstruction technique. CONTRAST:  60mL OMNIPAQUE IOHEXOL 350 MG/ML SOLN COMPARISON:  CT abdomen pelvis dated April 05, 2023. FINDINGS: Lower chest: No acute abnormality. Hepatobiliary: No focal liver abnormality is seen. No gallstones, gallbladder wall thickening, or biliary dilatation. Pancreas: Unremarkable. No pancreatic ductal dilatation or surrounding inflammatory changes. Spleen: Normal in size without focal abnormality. Adrenals/Urinary Tract: Adrenal glands are unremarkable. Multiple bilateral renal calculi again noted. No ureteral calculi or hydronephrosis. Bladder is decompressed. Stomach/Bowel: Stomach is within normal limits. Appendix appears normal. No evidence of bowel wall thickening, distention, or inflammatory changes. Vascular/Lymphatic: No significant vascular findings are present. No enlarged abdominal or pelvic lymph nodes. Reproductive: Retroverted uterus.  No adnexal mass. Other: No abdominal wall hernia or abnormality. No abdominopelvic ascites. No pneumoperitoneum. Musculoskeletal: No acute or significant osseous findings. Chronic bilateral L5 pars defects again noted. IMPRESSION: 1. No acute intra-abdominal process. 2. Bilateral nonobstructive nephrolithiasis. Electronically Signed   By: Obie Dredge M.D.   On: 04/07/2023 15:01   CT Renal Stone Study Result Date: 04/05/2023 CLINICAL DATA:  Nauseated and shaky since starting a new medication this morning. Abdominal/flank pain and diarrhea. EXAM: CT ABDOMEN AND PELVIS WITHOUT CONTRAST TECHNIQUE: Multidetector CT imaging of the abdomen and pelvis was performed following the standard protocol without IV contrast. RADIATION DOSE REDUCTION: This exam was performed  according to the departmental dose-optimization program which includes automated exposure control, adjustment of the mA and/or kV according to patient size and/or use of iterative reconstruction technique. COMPARISON:  07/31/2021 FINDINGS: Lower chest: No acute abnormality. Hepatobiliary: Unremarkable liver. Normal gallbladder. No biliary dilation. Pancreas: Unremarkable. Spleen: Unremarkable. Adrenals/Urinary Tract: Normal adrenal glands. Bilateral nonobstructing nephrolithiasis. No hydronephrosis. Bladder is unremarkable. Stomach/Bowel: Normal caliber large and small bowel. No bowel wall thickening. The appendix is normal.Stomach is within normal limits. Vascular/Lymphatic: No significant vascular findings are present. No enlarged abdominal or pelvic lymph nodes. Reproductive: Unremarkable. Other: No free intraperitoneal fluid or air. Musculoskeletal: No acute fracture. Chronic L5 pars defects with grade 1 anterolisthesis of L5. IMPRESSION: 1. No acute abnormality in the abdomen or pelvis. 2. Bilateral nonobstructing nephrolithiasis. No hydronephrosis. 3. Chronic unchanged L5 spondylolysis with spondylolisthesis. Electronically Signed   By: Minerva Fester M.D.   On: 04/05/2023 21:28   US RENAL Result Date: 04/05/2023 CLINICAL DATA:  Flank pain EXAM: RENAL / URINARY TRACT ULTRASOUND COMPLETE COMPARISON:  01/01/2023 renal ultrasound.  CT 07/31/2021. FINDINGS: Right Kidney: Renal measurements: 10.1 x 3.8 x 4.5 cm = volume: 88.7 mL. Echogenicity within normal limits. No mass or hydronephrosis visualized. Left Kidney: Renal  measurements: 11.8 x 5.2 x 4.6 cm = volume: 149.8 mL. Possible duplicated collecting system. No collecting system dilatation. There are some echogenic shadowing areas in the left kidney consistent with a multiple stones measuring up to 9 mm. Tiny left-sided central cyst as well measuring 9 mm. Bladder: Appears normal for degree of bladder distention. Other: None. IMPRESSION: No collecting system  dilatation. 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 Electronically Signed   By: Karen Kays M.D.   On: 04/05/2023 17:45    Procedures Procedures    Medications Ordered in ED Medications  ondansetron (ZOFRAN) injection 4 mg (4 mg Intravenous Patient Refused/Not Given 04/07/23 1227)  ketorolac (TORADOL) 30 MG/ML injection 30 mg (30 mg Intravenous Patient Refused/Not Given 04/07/23 1227)  LORazepam (ATIVAN) injection 1 mg (1 mg Intravenous Given 04/07/23 1216)  iohexol (OMNIPAQUE) 350 MG/ML injection 60 mL (60 mLs Intravenous Contrast Given 04/07/23 1442)    ED Course/ Medical Decision Making/ A&P                                 Medical Decision Making 38 year old female here today with multiple complaints.  Plan-with the variety the patient complaints, I believe that this is anxiety.  I spent 15 minutes in the room with the patient discussing all of her concerns with her, and why each of those concerns did not appear to be relevant today for this visit.  Following each concern, the patient would immediately present an additional concern.  She is quite tearful.  I explained the patient why she did not have sepsis, or serotonin syndrome.  In detail discussed all of her incidental findings and why they were not clinically relevant.  Instructed the patient on some breathing exercises, with trial her with some Ativan.  I am ordering laboratory testing and imaging on this patient in an attempt to better gain trust with this patient and hopefully ruling out underlying etiology.  Unclear why the patient is feeling so anxious, during the course of her ED stay, will circle back to see if I can uncover an underlying cause for her symptoms today.  Reassessment 3:25 PM-patient CT imaging negative.  Labs unremarkable.  Discussed with the patient, she still has multiple concerns over a variety of different ailments.  Will discharge patient have her follow-up with her PCP.  Amount  and/or Complexity of Data Reviewed Labs: ordered. Radiology: ordered.  Risk Prescription drug management.           Final Clinical Impression(s) / ED Diagnoses Final diagnoses:  Anxiety    Rx / DC Orders ED Discharge Orders     None         Arletha Pili, DO 04/07/23 1527

## 2023-04-07 NOTE — ED Notes (Signed)
Walked patient to the bathroom patient did well patient is now back in bed with call bell in reach 

## 2023-04-07 NOTE — ED Notes (Signed)
Pt still very tearful in the exam room. She is severely anxious and worried about everything. She has declined all pain medication and zofran. She states they have changed her psych medicines and "there's too much going on with me and it does not feel right."

## 2023-04-07 NOTE — ED Triage Notes (Signed)
Pt from home with ems c.o generalized abd pain and left sided flank pain. Pt diagnosed with kidney stone 2 days ago. Emesis x2. Pt crying hysterically in triage

## 2023-04-07 NOTE — ED Notes (Signed)
Pt extremely tearful while speaking to EDP. She declined all meds at this time

## 2023-04-07 NOTE — ED Notes (Signed)
Patient transported to CT 

## 2023-04-07 NOTE — Discharge Instructions (Signed)
While you are in the emergency room, you have blood work.  Your blood work was normal.  The small amount of red blood cells in your urine is normal following a menstrual cycle.  Your kidney stones are not causing problems.  You do not have sepsis.  You do not have serotonin syndrome.  Please follow-up with your regular doctor.

## 2023-04-08 NOTE — Telephone Encounter (Signed)
needs appointment, thx

## 2023-04-12 ENCOUNTER — Emergency Department (HOSPITAL_COMMUNITY): Payer: MEDICAID

## 2023-04-12 ENCOUNTER — Encounter (HOSPITAL_COMMUNITY): Payer: Self-pay | Admitting: Emergency Medicine

## 2023-04-12 ENCOUNTER — Other Ambulatory Visit: Payer: Self-pay

## 2023-04-12 ENCOUNTER — Emergency Department (HOSPITAL_COMMUNITY)
Admission: EM | Admit: 2023-04-12 | Discharge: 2023-04-12 | Disposition: A | Discharge status: Home / Self Care | Payer: MEDICAID

## 2023-04-12 DIAGNOSIS — F419 Anxiety disorder, unspecified: Secondary | ICD-10-CM

## 2023-04-12 DIAGNOSIS — F410 Panic disorder [episodic paroxysmal anxiety] without agoraphobia: Secondary | ICD-10-CM | POA: Diagnosis not present

## 2023-04-12 LAB — BASIC METABOLIC PANEL
Anion gap: 11 (ref 5–15)
BUN: 5 mg/dL — ABNORMAL LOW (ref 6–20)
CO2: 21 mmol/L — ABNORMAL LOW (ref 22–32)
Calcium: 9.8 mg/dL (ref 8.9–10.3)
Chloride: 106 mmol/L (ref 98–111)
Creatinine, Ser: 0.66 mg/dL (ref 0.44–1.00)
GFR, Estimated: >60 mL/min (ref >60)
Glucose, Bld: 106 mg/dL — ABNORMAL HIGH (ref 70–99)
Potassium: 3.9 mmol/L (ref 3.5–5.1)
Sodium: 138 mmol/L (ref 135–145)

## 2023-04-12 LAB — CBC WITH DIFFERENTIAL/PLATELET
Abs Immature Granulocytes: 0.03 10*3/uL (ref 0.00–0.07)
Basophils Absolute: 0 10*3/uL (ref 0.0–0.1)
Basophils Relative: 0 %
Eosinophils Absolute: 0 10*3/uL (ref 0.0–0.5)
Eosinophils Relative: 0 %
HCT: 40.4 % (ref 36.0–46.0)
Hemoglobin: 13.8 g/dL (ref 12.0–15.0)
Immature Granulocytes: 0 %
Lymphocytes Relative: 28 %
Lymphs Abs: 2.2 10*3/uL (ref 0.7–4.0)
MCH: 29.2 pg (ref 26.0–34.0)
MCHC: 34.2 g/dL (ref 30.0–36.0)
MCV: 85.4 fL (ref 80.0–100.0)
Monocytes Absolute: 0.5 10*3/uL (ref 0.1–1.0)
Monocytes Relative: 7 %
Neutro Abs: 4.9 10*3/uL (ref 1.7–7.7)
Neutrophils Relative %: 65 %
Platelets: 449 10*3/uL — ABNORMAL HIGH (ref 150–400)
RBC: 4.73 MIL/uL (ref 3.87–5.11)
RDW: 12.2 % (ref 11.5–15.5)
WBC: 7.6 10*3/uL (ref 4.0–10.5)
nRBC: 0 % (ref 0.0–0.2)

## 2023-04-12 LAB — TSH: TSH: 1.152 u[IU]/mL (ref 0.350–4.500)

## 2023-04-12 LAB — HCG, SERUM, QUALITATIVE: Preg, Serum: NEGATIVE

## 2023-04-12 LAB — T4, FREE: Free T4: 1.07 ng/dL (ref 0.61–1.12)

## 2023-04-12 LAB — D-DIMER, QUANTITATIVE: D-Dimer, Quant: 0.28 ug{FEU}/mL (ref 0.00–0.50)

## 2023-04-12 MED ORDER — LORAZEPAM 1 MG PO TABS
2.0000 mg | ORAL_TABLET | Freq: Once | ORAL | Status: AC
  Administered 2023-04-12: 2 mg via ORAL
  Filled 2023-04-12: qty 2

## 2023-04-12 MED ORDER — DIAZEPAM 5 MG PO TABS
5.0000 mg | ORAL_TABLET | Freq: Once | ORAL | Status: AC
  Administered 2023-04-12: 5 mg via ORAL
  Filled 2023-04-12: qty 1

## 2023-04-12 NOTE — ED Triage Notes (Signed)
Presents from home for anxiety.   Started a new medication for anxiety yesterday and now experiencing tremors, insomnia, increased activity, SOB, chest tightness.  MD directed medication change but did not help.   No meds en route.  EMS VS: 122/80, 120bpm, 18RR, 98% RA

## 2023-04-12 NOTE — ED Provider Triage Note (Signed)
Emergency Medicine Provider Triage Evaluation Note  Samantha Nguyen , a 38 y.o. female  was evaluated in triage.  Pt complains of anxiety, palpitations, chest tightness.  The patient states that she has been on a number of different medications for anxiety.  She has been taking 2 mg of Ativan daily at home in addition to Lexapro and Paxil.  She was switched over to Rexulti for recently.  She denies any fevers today.  She endorses chest tightness, palpitations, sensation of panic attacks.  She has been unable to sleep for the last 2 days.  She called her psychiatrist who told her to go to the emergency department for evaluation for possible thyroid dysfunction.  She denies SI or HI, denies AVH.  Review of Systems  Positive: Anxious, palpitations, chest tightness, SOB, difficulty sleeping Negative: SI, HI, AVH, Abdominal pain, vomiting  Physical Exam  BP (!) 120/92   Pulse (!) 125   Temp 98.4 F (36.9 C)   Resp 18   Wt 66.7 kg   LMP 04/05/2023 (Approximate)   SpO2 99%   BMI 23.02 kg/m  Gen:   Awake, anxious and tearful Resp:  Normal effort  Card:  Tachycardic, perfusing well MSK:   Moves extremities without difficulty  Psych:  Denies SI, HI, AVH. Endorses severe anxiety   Medical Decision Making  Medically screening exam initiated at 12:22 PM.  Appropriate orders placed.  [name] was informed that the remainder of the evaluation will be completed by another provider, this initial triage assessment does not replace that evaluation, and the importance of remaining in the ED until their evaluation is complete.  Workup initiated to screen for thyroid dysfunction, PE, electrolyte abnormality. May need TTS consult for medication management once medical screening is complete. Pt does not meet criteria for IVC.    Ernie Avena, MD 04/12/23 1226

## 2023-04-12 NOTE — Discharge Instructions (Signed)
Please follow-up with your primary doctor.  Return if you develop any new or worsening symptoms that are concerning to you.

## 2023-04-12 NOTE — ED Provider Notes (Signed)
[name] EMERGENCY DEPARTMENT AT [facility] Provider Note   CSN: [id number] Arrival date & time: 04/12/23  1133     History  No chief complaint on file.   Samantha Nguyen is a 38 y.o. female.  This is a 38 year old female present emergency department for anxiety.  She has been tapering SSRIs to different medications over the past several weeks has had increasing anxiety attacks.  Was given Ativan by her outpatient psychiatrist to bridge the gap.  She reports decreasing efficacy.  Was prescribed Valium, but had not picked up prescription.  Had a typical panic attack for her.  Called her psychiatrist to recommended coming to the emergency department for blood work.  She had some diarrhea earlier, but not having abdominal pain.  Reports still feeling somewhat anxious after Ativan given in triage.          Home Medications Prior to Admission medications   Medication Sig Start Date End Date Taking? Authorizing Provider  Cholecalciferol (VITAMIN D3) 1.25 MG (50000 UT) CAPS Take 1 capsule (1.25 mg total) by mouth once a week. AFTER the last weekly pill, start a daily OTC Vitamin D3 up to 4,000units. 03/25/23   Dulce Sellar, NP  Cyanocobalamin (B-12) 1000 MCG SUBL Place 1,000 mcg under the tongue daily.    [provider]  diphenhydrAMINE (BENADRYL) 25 mg capsule Take 25 mg by mouth at bedtime as needed for sleep.    [provider]  Doxepin HCl 6 MG TABS Take 6 mg by mouth at bedtime.    [provider]  escitalopram (LEXAPRO) 10 MG tablet Take 15 mg by mouth daily. Take one 5mg  tablet with one 10mg  tablet by mouth once a day for total dose of 15mg . 03/21/23   [provider]  escitalopram (LEXAPRO) 5 MG tablet Take 15 mg by mouth daily. Take one 5mg  tablet with one 10mg  tablet by mouth once a day for total dose of 15mg .    [provider]  LORazepam (ATIVAN) 1 MG tablet Take 0.5-1 mg by mouth daily as needed. 01/21/23   [provider]  LOREEV XR 1.5 MG CS24 Take 1.5 mg by mouth at bedtime. 03/28/23   [provider]  PARoxetine (PAXIL) 10 MG tablet Take 2.5 mg by mouth daily.    [provider]  Probiotic Product (PROBIOTIC-10 PO) Take 1 capsule by mouth daily.    [provider]      Allergies    Sulfa antibiotics    Review of Systems   Review of Systems  Physical Exam Updated Vital Signs BP (!) 136/96   Pulse 100   Temp 98.7 F (37.1 C) (Oral)   Resp 18   Wt 66.7 kg   LMP 04/05/2023 (Approximate)   SpO2 100%   BMI 23.02 kg/m  Physical Exam Vitals and nursing note reviewed.  Constitutional:      General: She is not in acute distress.    Appearance: She is not toxic-appearing.  HENT:     Head: Normocephalic.     Nose: Nose normal.     Mouth/Throat:     Mouth: Mucous membranes are moist.  Cardiovascular:     Rate and Rhythm: Normal rate and regular rhythm.  Pulmonary:     Effort: Pulmonary effort is normal.  Abdominal:     General: Abdomen is flat. There is no distension.     Palpations: Abdomen is soft.     Tenderness: There is no abdominal tenderness. There is  no guarding or rebound.  Musculoskeletal:        General: Normal range of motion.  Skin:    General: Skin is warm.     Capillary Refill: Capillary refill takes less than 2 seconds.  Neurological:     General: No focal deficit present.     Mental Status: She is alert.  Psychiatric:     Comments: No SI, HI or hallucinations.  She is quite anxious.  Linear goal-directed thought content.  Behavior normal limit.     ED Results / Procedures / Treatments   Labs (all labs ordered are listed, but only abnormal results are displayed) Labs Reviewed  CBC WITH DIFFERENTIAL/PLATELET - Abnormal; Notable for the following components:      Result Value   Platelets 449 (*)    All other components within normal limits  BASIC METABOLIC PANEL - Abnormal; Notable for the following components:   CO2 21 (*)     Glucose, Bld 106 (*)    BUN 5 (*)    All other components within normal limits  TSH  T4, FREE  D-DIMER, QUANTITATIVE  HCG, SERUM, QUALITATIVE  T3, FREE    EKG EKG Interpretation Date/Time:  Tuesday April 12 2023 11:57:01 EST Ventricular Rate:  117 PR Interval:  114 QRS Duration:  78 QT Interval:  314 QTC Calculation: 438 R Axis:   93  Text Interpretation: Sinus tachycardia Rightward axis Borderline ECG When compared with ECG of 05-Apr-2023 09:15, PREVIOUS ECG IS PRESENT Confirmed by Ernie Avena ([id number]) on 04/12/2023 12:21:53 PM  Radiology DG Chest 1 View Result Date: 04/12/2023 CLINICAL DATA:  Chest tightness. EXAM: CHEST  1 VIEW COMPARISON:  March 04, 2023. FINDINGS: The heart size and mediastinal contours are within normal limits. Both lungs are clear. The visualized skeletal structures are unremarkable. IMPRESSION: No active disease. Electronically Signed   By: Lupita Raider M.D.   On: 04/12/2023 13:25    Procedures Procedures    Medications Ordered in ED Medications  diazepam (VALIUM) tablet 5 mg (has no administration in time range)  LORazepam (ATIVAN) tablet 2 mg (2 mg Oral Given 04/12/23 1306)    ED Course/ Medical Decision Making/ A&P                                 Medical Decision Making This is a well-appearing 38 year old female present emergency department for anxiety attack.  She was slightly tachycardic which likely secondary to her panic attack.  Hemodynamically stable.  Has been seen several times in the emergency department for similar complaint.  Workup today reassuring.  No significant metabolic derangements.  Normal kidney function.  Thyroid studies do not indicate thyroid storm.  Pregnancy test is negative.  D-dimer negative.  DVT/PE unlikely.  No fever or leukocytosis to suggest systemic infection.  She has picked up her Valium and has it at home.  Will give her a dose here, but as she is feeling somewhat improved and not having any other  acute psych disturbances and does not clinically appear to be a danger to herself or others feel that she is safe for discharge at this time.  Risk Prescription drug management.         Final Clinical Impression(s) / ED Diagnoses Final diagnoses:  None    Rx / DC Orders ED Discharge Orders     None         Coral Spikes, DO 04/12/23  2109  

## 2023-04-12 NOTE — Telephone Encounter (Signed)
[name] needs to follow her psychiatrist's advice. I agree she should be in an in-patient setting with so many medication adjustments and the hospital psychiatric floor is about the only option, but her psych should be able to advise if other options.

## 2023-04-13 LAB — T3, FREE: T3, Free: 3.5 pg/mL (ref 2.0–4.4)

## 2023-04-14 ENCOUNTER — Other Ambulatory Visit: Payer: Self-pay

## 2023-04-14 ENCOUNTER — Encounter (HOSPITAL_COMMUNITY): Payer: Self-pay | Admitting: *Deleted

## 2023-04-14 ENCOUNTER — Emergency Department (HOSPITAL_COMMUNITY)
Admission: EM | Admit: 2023-04-14 | Discharge: 2023-04-14 | Disposition: A | Discharge status: Home / Self Care | Payer: MEDICAID

## 2023-04-14 DIAGNOSIS — F419 Anxiety disorder, unspecified: Secondary | ICD-10-CM | POA: Diagnosis present

## 2023-04-14 DIAGNOSIS — R000 Tachycardia, unspecified: Secondary | ICD-10-CM | POA: Insufficient documentation

## 2023-04-14 LAB — BASIC METABOLIC PANEL
Anion gap: 15 (ref 5–15)
BUN: 5 mg/dL — ABNORMAL LOW (ref 6–20)
CO2: 20 mmol/L — ABNORMAL LOW (ref 22–32)
Calcium: 9.9 mg/dL (ref 8.9–10.3)
Chloride: 101 mmol/L (ref 98–111)
Creatinine, Ser: 0.77 mg/dL (ref 0.44–1.00)
GFR, Estimated: >60 mL/min (ref >60)
Glucose, Bld: 109 mg/dL — ABNORMAL HIGH (ref 70–99)
Potassium: 3.6 mmol/L (ref 3.5–5.1)
Sodium: 136 mmol/L (ref 135–145)

## 2023-04-14 LAB — CBC WITH DIFFERENTIAL/PLATELET
Abs Immature Granulocytes: 0.03 10*3/uL (ref 0.00–0.07)
Basophils Absolute: 0 10*3/uL (ref 0.0–0.1)
Basophils Relative: 0 %
Eosinophils Absolute: 0.1 10*3/uL (ref 0.0–0.5)
Eosinophils Relative: 1 %
HCT: 41.8 % (ref 36.0–46.0)
Hemoglobin: 14.2 g/dL (ref 12.0–15.0)
Immature Granulocytes: 0 %
Lymphocytes Relative: 24 %
Lymphs Abs: 2.5 10*3/uL (ref 0.7–4.0)
MCH: 29 pg (ref 26.0–34.0)
MCHC: 34 g/dL (ref 30.0–36.0)
MCV: 85.3 fL (ref 80.0–100.0)
Monocytes Absolute: 0.8 10*3/uL (ref 0.1–1.0)
Monocytes Relative: 8 %
Neutro Abs: 7 10*3/uL (ref 1.7–7.7)
Neutrophils Relative %: 67 %
Platelets: 510 10*3/uL — ABNORMAL HIGH (ref 150–400)
RBC: 4.9 MIL/uL (ref 3.87–5.11)
RDW: 12.3 % (ref 11.5–15.5)
WBC: 10.4 10*3/uL (ref 4.0–10.5)
nRBC: 0 % (ref 0.0–0.2)

## 2023-04-14 MED ORDER — HYDROXYZINE HCL 25 MG PO TABS
25.0000 mg | ORAL_TABLET | Freq: Three times a day (TID) | ORAL | Status: DC | PRN
  Administered 2023-04-14: 25 mg via ORAL
  Filled 2023-04-14: qty 1

## 2023-04-14 MED ORDER — LORAZEPAM 1 MG PO TABS: 1.0000 mg | ORAL_TABLET | Freq: Every day | ORAL | 0 refills | Status: AC | PRN

## 2023-04-14 MED ORDER — LORAZEPAM 1 MG PO TABS
1.0000 mg | ORAL_TABLET | Freq: Once | ORAL | Status: AC
  Administered 2023-04-14: 1 mg via ORAL
  Filled 2023-04-14: qty 1

## 2023-04-14 MED ORDER — LORAZEPAM 2 MG/ML IJ SOLN
1.0000 mg | Freq: Once | INTRAMUSCULAR | Status: AC
  Administered 2023-04-14: 1 mg via INTRAMUSCULAR
  Filled 2023-04-14: qty 1

## 2023-04-14 NOTE — Discharge Instructions (Addendum)
Evaluation today was overall reassuring.  I do recommend that you follow-up with your behavioral health provider and continue bridging to the Lexapro as planned.  I sent 4 tablets of Ativan to your pharmacy for acute anxiety at home.  Would like to also remind you that Ativan or diazepam would not be an appropriate long-term solution to your anxiety.  Also recommend lifestyle changes like healthy diet, daily exercise and pursuing gainful employment.  I do want to reiterate that these goals are difficult to achieve but attainable.  If you have an anxiety or panic attack, develop chest pain or shortness of breath or homicidal or suicidal ideation please return emerged department further evaluation.

## 2023-04-14 NOTE — ED Triage Notes (Signed)
Pt says that she keeps having panic attacks since switching from ativan to valium a couple of days ago. She denies SI or HI.

## 2023-04-14 NOTE — ED Provider Triage Note (Signed)
Emergency Medicine Provider Triage Evaluation Note  Samantha Nguyen , a 38 y.o. female  was evaluated in triage.  Pt complains of patient here complaining of constant panic attack for the last 24 hours after being changed from Ativan to Valium which she states is "not working.".  This is the patient's third visit for anxiety in the last week and fourth visit in the last month.  Review of Systems  Positive: Anxiety Negative: Chest pain  Physical Exam  BP (!) 135/90   Pulse (!) 111   Temp 97.9 F (36.6 C) (Oral)   Resp 16   Ht 5\' 7"  (1.702 m)   Wt 66.7 kg   LMP 04/05/2023 (Approximate)   SpO2 100%   BMI 23.02 kg/m  Gen:   Awake, no distress   Resp:  Normal effort  MSK:   Moves extremities without difficulty  Other:  Patient visibly shaking and hunched over in her chair with a furrowed brow complaining of anxiety.  Medical Decision Making  Medically screening exam initiated at 7:01 AM.  Appropriate orders placed.  Samantha Nguyen was informed that the remainder of the evaluation will be completed by another provider, this initial triage assessment does not replace that evaluation, and the importance of remaining in the ED until their evaluation is complete.     Samantha Captain, PA-C 04/14/23 [phone]

## 2023-04-14 NOTE — ED Provider Notes (Signed)
[place] EMERGENCY DEPARTMENT AT [facility] Provider Note   CSN: [id number] Arrival date & time: 04/14/23  7253     History  Chief Complaint  Patient presents with   Anxiety   HPI Samantha Nguyen is a 38 y.o. female with history of severe anxiety with panic, MDD presenting for panic attacks.  States she has had a persistent sense of doom since 2 days ago.  States she has been taking her Valium as prescribed but has not been working.  States she was recently switched from Ativan to Valium by her PCP.  She denies chest pain and shortness of breath.  Denies SI, HI and hallucinations.   Anxiety       Home Medications Prior to Admission medications   Medication Sig Start Date End Date Taking? Authorizing Provider  acetaminophen (TYLENOL) 500 MG tablet Take 1,000 mg by mouth daily as needed for moderate pain (pain score 4-6) or headache.   Yes [provider]  Cholecalciferol (VITAMIN D3) 1.25 MG (50000 UT) CAPS Take 1 capsule (1.25 mg total) by mouth once a week. AFTER the last weekly pill, start a daily OTC Vitamin D3 up to 4,000units. Patient taking differently: Take 1 capsule by mouth every Saturday. 03/25/23  Yes Hudnell, Judeth Cornfield, NP  Cyanocobalamin (VITAMIN B-12 SL) Place 1 tablet under the tongue daily.   Yes [provider]  diphenhydrAMINE (BENADRYL) 50 MG tablet Take 50 mg by mouth at bedtime as needed for sleep.   Yes [provider]  escitalopram (LEXAPRO) 10 MG tablet Take 10 mg by mouth daily. 03/21/23  Yes [provider]  LORazepam (ATIVAN) 1 MG tablet Take 0.5-1 mg by mouth daily as needed for anxiety (panic attacks). 01/21/23  Yes [provider]      Allergies    Sulfa antibiotics    Review of Systems   See HPI for pertinent positives  Physical Exam Updated Vital Signs BP (!) 131/93   Pulse (!) 112   Temp 98.4 F (36.9 C) (Oral)   Resp 17   Ht 5\' 7"  (1.702 m)   Wt 66.7 kg   LMP 04/05/2023  (Approximate)   SpO2 100%   BMI 23.02 kg/m  Physical Exam Vitals and nursing note reviewed.  HENT:     Head: Normocephalic and atraumatic.     Mouth/Throat:     Mouth: Mucous membranes are moist.  Eyes:     General:        Right eye: No discharge.        Left eye: No discharge.     Conjunctiva/sclera: Conjunctivae normal.  Cardiovascular:     Rate and Rhythm: Regular rhythm. Tachycardia present.     Pulses: Normal pulses.     Heart sounds: Normal heart sounds.  Pulmonary:     Effort: Pulmonary effort is normal.     Breath sounds: Normal breath sounds.  Abdominal:     General: Abdomen is flat.     Palpations: Abdomen is soft.  Skin:    General: Skin is warm and dry.  Neurological:     General: No focal deficit present.     Mental Status: She is alert and oriented to person, place, and time.  Psychiatric:        Mood and Affect: Mood is anxious.     ED Results / Procedures / Treatments   Labs (all labs ordered are listed, but only abnormal results are displayed) Labs Reviewed  BASIC METABOLIC PANEL - Abnormal; Notable  for the following components:      Result Value   CO2 20 (*)    Glucose, Bld 109 (*)    BUN 5 (*)    All other components within normal limits  CBC WITH DIFFERENTIAL/PLATELET - Abnormal; Notable for the following components:   Platelets 510 (*)    All other components within normal limits    EKG None  Radiology No results found.  Procedures Procedures    Medications Ordered in ED Medications  hydrOXYzine (ATARAX) tablet 25 mg (25 mg Oral Given 04/14/23 1136)  LORazepam (ATIVAN) tablet 1 mg (1 mg Oral Given 04/14/23 1136)  LORazepam (ATIVAN) injection 1 mg (1 mg Intramuscular Given 04/14/23 1335)    ED Course/ Medical Decision Making/ A&P                                 Medical Decision Making Risk Prescription drug management.   38 year old well-appearing female presenting for anxiety.  Exam notable for tachycardia but an anxious  demeanor but otherwise reassuring.  Considered PE but unlikely given no chest pain or shortness of breath and heart rate did improve with treatment of Ativan.  Suspect her symptoms are psychogenic.  On reassessment, she stated symptoms had improved considerably.  Noted that her blood pressure and tachycardia also improved as well and she remained well-appearing in no acute distress.  Advised her to follow-up with her behavioral health provider who she sees regularly but did advise her that Valium or Ativan would be propria long-term solution to her anxiety.  Patient did states she is working with her provider to bridge her to Lexapro.  I sent her home with 4 tablets of Ativan which I feel is appropriate given that she is bridging to Lexapro.  Discussed return precautions.  Vital stable.  Discharged good condition.        Final Clinical Impression(s) / ED Diagnoses Final diagnoses:  Anxiety    Rx / DC Orders ED Discharge Orders     None         Gareth Eagle, PA-C 04/14/23 1435    Durwin Glaze, MD 04/15/23 [phone]

## 2023-04-14 NOTE — ED Triage Notes (Signed)
Pt arrives via [facility], per report, the pt was switched from ativan to valium 2 days ago. Last dose 1930 pm last night (10mg ). She feels like it has not been working as long as the ativan usually does, so her anxiety is increased. Vitals 159/101, hr 126, 99%. EKG ST

## 2023-04-20 ENCOUNTER — Other Ambulatory Visit (HOSPITAL_COMMUNITY): Payer: MEDICAID | Attending: Oncology

## 2023-04-26 ENCOUNTER — Telehealth (HOSPITAL_COMMUNITY): Payer: Self-pay | Admitting: Professional

## 2023-04-27 ENCOUNTER — Ambulatory Visit: Payer: MEDICAID | Admitting: Family

## 2023-04-27 ENCOUNTER — Encounter: Payer: Self-pay | Admitting: Family

## 2023-04-27 VITALS — BP 124/82 | HR 105 | Temp 98.1°F | Ht 67.0 in | Wt 146.6 lb

## 2023-04-27 DIAGNOSIS — M4316 Spondylolisthesis, lumbar region: Secondary | ICD-10-CM | POA: Diagnosis not present

## 2023-04-27 DIAGNOSIS — M4306 Spondylolysis, lumbar region: Secondary | ICD-10-CM

## 2023-04-27 DIAGNOSIS — M47816 Spondylosis without myelopathy or radiculopathy, lumbar region: Secondary | ICD-10-CM

## 2023-04-27 DIAGNOSIS — N200 Calculus of kidney: Secondary | ICD-10-CM | POA: Diagnosis not present

## 2023-04-27 NOTE — Progress Notes (Signed)
Patient ID: Samantha Nguyen, female    DOB: 1985-10-21, 38 y.o.   MRN: [id number]  Chief Complaint  Patient presents with   Back Pain   Discussed the use of AI scribe software for clinical note transcription with the patient, who gave verbal consent to proceed.  History of Present Illness   Samantha Nguyen "[name]" is a 38 year old female with a history of kidney stones and spine issues who presents with back pain and sciatica-like symptoms. She experiences back pain and sciatica-like symptoms, which start in her back on the left side and radiate down her leg, similar to sciatica experienced during pregnancy in 2012. The pain is described as shooting and burning, sometimes reaching down to her knees, but not as severe as during her pregnancy. No numbness or pins and needles sensation. She has been using heating pads for relief. 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 She has a history of kidney stones and kidney cysts, which are stable and she has a follow up with Urology.  She reports sleep disturbances, having difficulty falling asleep and waking up early, often due to her husband's movements or the dogs. She has tried to improve her sleep hygiene by reducing caffeine intake and using earplugs.     Assessment & Plan:     Lumbar Spondylosis w/spondylolisthesis - Pain radiating from lower back to legs, similar to previous sciatica during pregnancy. No numbness or pins and needles sensation. Degenerative changes noted on CT scan.   -Consider short-term use of over-the-counter generic Aleve for pain relief bid as needed. -Refer to sports medicine provider for further evaluation and treatment options, including physical therapy.    Kidney Stones  - Asymptomatic, no current pain. Previous CT scan confirmed presence of stones.   -Continue current management plan and follow up with Urology.  Insomnia   - Difficulty falling asleep and early morning awakenings. No current pharmacological treatment.   -Recommend sleep hygiene measures including avoiding caffeine, maintaining a dark and cool room, and considering white noise.   -Advised patient to discuss further treatment options with her psychiatrist.      Subjective:    Outpatient Medications Prior to Visit  Medication Sig Dispense Refill   Cholecalciferol (VITAMIN D3) 1.25 MG (50000 UT) CAPS Take 1 capsule (1.25 mg total) by mouth once a week. AFTER the last weekly pill, start a daily OTC Vitamin D3 up to 4,000units. (Patient taking differently: Take 1 capsule by mouth every Saturday.) 12 capsule 0   Cyanocobalamin (VITAMIN B-12 SL) Place 1 tablet under the tongue daily.     escitalopram (LEXAPRO) 10 MG tablet Take 12.5 mg by mouth daily.     hydrOXYzine (VISTARIL) 25 MG capsule Take 25 mg by mouth 3 (three) times daily as needed.     LORazepam (ATIVAN) 1 MG tablet Take 1 tablet (1 mg total) by mouth daily as needed for anxiety (panic attacks). (Patient taking differently: Take 1 mg by mouth 3 (three) times daily as needed for anxiety (panic attacks).) 4 tablet 0   acetaminophen (TYLENOL) 500 MG tablet Take 1,000 mg by mouth daily as needed for moderate pain (pain score 4-6) or headache.     diphenhydrAMINE (BENADRYL) 50 MG tablet Take 50 mg by mouth at bedtime as needed for sleep.     No facility-administered medications prior to visit.   Past Medical History:  Diagnosis Date   Allergy  [birth year]   Have had them my whole life   Anxiety    Chronic kidney disease    Costochondritis 09/02/2022   Depressed    History of kidney stones    MDD (major depressive disorder), recurrent episode, severe (HCC) 01/06/2023   MDD (major depressive disorder), single episode, severe (HCC) 04/28/2022   Migraine    Other chest pain 09/02/2022   Renal disorder    Tension-type headache, not intractable 07/27/2022   Past Surgical History:  Procedure  Laterality Date   CESAREAN SECTION     CYSTOSCOPY W/ URETERAL STENT PLACEMENT Right 08/24/2016   Procedure: CYSTOSCOPY WITH RETROGRADE PYELOGRAM/URETERAL STENT PLACEMENT;  Surgeon: Malen Gauze, MD;  Location: [facility];  Service: Urology;  Laterality: Right;   CYSTOSCOPY WITH RETROGRADE PYELOGRAM, URETEROSCOPY AND STENT PLACEMENT Right 09/09/2016   Procedure: CYSTOSCOPY WITH RETROGRADE PYELOGRAM, URETEROSCOPY AND STENT REPLACEMENT;  Surgeon: Malen Gauze, MD;  Location: [facility];  Service: Urology;  Laterality: Right;   MANDIBLE SURGERY     Allergies  Allergen Reactions   Sulfa Antibiotics Other (See Comments)    Unknown childhood reaction      Objective:    Physical Exam Vitals and nursing note reviewed.  Constitutional:      Appearance: Normal appearance.  Cardiovascular:     Rate and Rhythm: Normal rate and regular rhythm.  Pulmonary:     Effort: Pulmonary effort is normal.     Breath sounds: Normal breath sounds.  Musculoskeletal:        General: Normal range of motion.  Skin:    General: Skin is warm and dry.  Neurological:     Mental Status: She is alert.  Psychiatric:        Mood and Affect: Mood normal.        Behavior: Behavior normal.    Temp 98.1 F (36.7 C) (Temporal)   Ht 5\' 7"  (1.702 m)   Wt 146 lb 9.6 oz (66.5 kg)   LMP 04/07/2023 (Approximate)   BMI 22.96 kg/m  Wt Readings from Last 3 Encounters:  04/27/23 146 lb 9.6 oz (66.5 kg)  04/14/23 147 lb (66.7 kg)  04/12/23 147 lb (66.7 kg)       Dulce Sellar, NP

## 2023-04-27 NOTE — Assessment & Plan Note (Signed)
Pain radiating from lower back to legs, similar to previous sciatica during pregnancy. No numbness or pins and needles sensation. Degenerative changes noted on CT scan.   -Consider short-term use of over-the-counter generic Aleve for pain relief bid as needed. -Refer to sports medicine provider for further evaluation and treatment options, including physical therapy.

## 2023-04-27 NOTE — Assessment & Plan Note (Signed)
Asymptomatic, no current pain. Previous CT scan confirmed presence of stones.   -Continue current management plan and follow up with Urology.

## 2023-04-28 ENCOUNTER — Ambulatory Visit: Payer: MEDICAID | Admitting: Sports Medicine

## 2023-04-28 VITALS — BP 110/68 | HR 102 | Ht 67.0 in | Wt 146.0 lb

## 2023-04-28 DIAGNOSIS — G8929 Other chronic pain: Secondary | ICD-10-CM | POA: Diagnosis not present

## 2023-04-28 DIAGNOSIS — M5442 Lumbago with sciatica, left side: Secondary | ICD-10-CM | POA: Diagnosis not present

## 2023-04-28 DIAGNOSIS — M4310 Spondylolisthesis, site unspecified: Secondary | ICD-10-CM

## 2023-04-28 NOTE — Patient Instructions (Signed)
Tylenol [phone] mg 2-3 times a day for pain relief  Low back HEP  PT referral  6-8 week follow up
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
## 2023-04-29 ENCOUNTER — Encounter: Payer: Self-pay | Admitting: Family

## 2023-05-01 NOTE — Telephone Encounter (Signed)
they are just slightly elevated & can be a reactive response to taking hight doses of Vitamin B12 (injections) also r/t any recent infection or inflammation. We can continue to monitor and recheck in a month.

## 2023-05-04 ENCOUNTER — Ambulatory Visit: Payer: MEDICAID | Admitting: Urology

## 2023-05-06 ENCOUNTER — Ambulatory Visit (INDEPENDENT_AMBULATORY_CARE_PROVIDER_SITE_OTHER): Payer: MEDICAID | Admitting: Urology

## 2023-05-06 ENCOUNTER — Encounter: Payer: Self-pay | Admitting: Urology

## 2023-05-06 VITALS — BP 129/89 | HR 99

## 2023-05-06 DIAGNOSIS — N200 Calculus of kidney: Secondary | ICD-10-CM

## 2023-05-06 LAB — POCT URINALYSIS DIPSTICK
Bilirubin, UA: NEGATIVE
Glucose, UA: NEGATIVE
Ketones, UA: NEGATIVE
Leukocytes, UA: NEGATIVE
Nitrite, UA: NEGATIVE
Protein, UA: POSITIVE — AB
Spec Grav, UA: >=1.03 — AB (ref 1.010–1.025)
Urobilinogen, UA: 0.2 U/dL
pH, UA: 7 (ref 5.0–8.0)

## 2023-05-06 NOTE — Patient Instructions (Signed)
Ureteroscopy  Ureteroscopy is a procedure to check for and treat problems inside part of the urinary tract. In this procedure, a long rigid or flexible tube with a lens and light at the end (ureteroscope) is used to look at the inside of the kidneys and the ureters. The ureters are the tubes that carry urine from the kidneys to the bladder. The ureteroscope is inserted into one or both of the ureters. You may need this procedure if you have frequent urinary tract infections (UTIs), blood in your urine, or a stone in one or both of your ureters. A ureteroscopy can be done: To find the cause of urine blockage in a ureter and to evaluate other abnormalities inside the ureters or kidneys. To remove stones. To remove or treat growths of tissue (polyps), abnormal tissue, and some types of tumors. To remove a tissue sample and check it for disease under a microscope (biopsy). Tell a health care provider about: Any allergies you have. All medicines you are taking, including vitamins, herbs, eye drops, creams, and over-the-counter medicines. Any problems you or family members have had with anesthetic medicines. Any bleeding problems you have. Any surgeries you have had. Any medical conditions you have. Whether you are pregnant or may be pregnant. What are the risks? Your health care provider will talk with you about risks. These may include: Abdominal pain or a burning feeling or pain while urinating. Abnormal bleeding. A UTI. Allergic reactions to medicines. Scarring that narrows the ureter (stricture) or swelling. Creating a hole (perforation) in the ureter. Damage to other structures or organs, such as the part of your body that drains urine from your bladder (urethra), your bladder, or your uterus. What happens before the procedure? When to stop eating and drinking  8 hours before your procedure Stop eating most foods. Do not eat meat, fried foods, or fatty foods. Eat only light foods, such  as toast or crackers. All liquids are okay except energy drinks and alcohol. 6 hours before your procedure Stop eating. Drink only clear liquids, such as water, clear fruit juice, black coffee, plain tea, and sports drinks. Do not drink energy drinks or alcohol. 2 hours before your procedure Stop drinking all liquids. You may be allowed to take medicines with small sips of water. Medicines Ask your health care provider about: Changing or stopping your regular medicines. These include any diabetes medicines or blood thinners you take. Taking medicines such as aspirin and ibuprofen. These medicines can thin your blood. Do not take these medicines unless your health care provider tells you to. Taking over-the-counter medicines, vitamins, herbs, and supplements. General instructions Do not use any products that contain nicotine or tobacco for at least 4 weeks before the procedure. These products include cigarettes, chewing tobacco, and vaping devices, such as e-cigarettes. If you need help quitting, ask your health care provider. If you will be going home right after the procedure, plan to have a responsible adult: Take you home from the hospital or clinic. You will not be allowed to drive. Care for you for the time you are told. Ask your health care provider what steps will be taken to help prevent infection. These may include: Washing skin with a soap that kills germs. Receiving antibiotic medicine. Tests You may have an exam or testing. You may have a urine sample taken to check for infection. What happens during the procedure? An IV will be inserted into one of your veins. You may be given: A sedative. This helps   you relax. Anesthesia. This will: Numb certain areas of your body. Make you fall asleep for surgery. Your urethra will be cleaned with a germ-killing solution. The ureteroscope will be passed through your urethra into your bladder. A salt-water solution will be sent through  the ureteroscope to fill your bladder. This will help the health care provider see the openings of your ureters more clearly. The ureteroscope will be passed into your ureter. If a growth is found, a biopsy may be done. If a stone is found, it may be removed through the ureteroscope, or the stone may be broken up using a laser, shock waves, or electrical energy. In some cases, if the ureter is too small, a tube may be inserted that keeps the ureter open (ureteral stent). The stent may be left in place for 1 or 2 weeks, and then the ureteroscopy procedure will be done again. The scope will be removed, and your bladder will be emptied. The procedure may vary among health care providers and hospitals. What happens after the procedure? Your blood pressure, heart rate, breathing rate, and blood oxygen level will be monitored until you leave the hospital or clinic. It is up to you to get the results of your procedure. Ask your health care provider, or the department that is doing the procedure, when your results will be ready. Summary Ureteroscopy is a procedure used to look at the inside of the kidneys and the ureters. You may need this procedure if you have frequent urinary tract infections (UTIs), blood in your urine, or a stone in one or both of your ureters. Follow instructions from your health care provider about eating and drinking. In some cases, if the ureter is too small, a tube may be inserted that keeps the ureter open (ureteral stent). The stent may be left in place for 1 or 2 weeks to keep the ureter open, and then the ureteroscopy procedure will be done again. This information is not intended to replace advice given to you by your health care provider. Make sure you discuss any questions you have with your health care provider. Document Revised: 02/01/2022 Document Reviewed: 02/01/2022 Elsevier Patient Education  2024 [employer]vier Inc[employer]  

## 2023-05-06 NOTE — H&P (View-Only) (Signed)
 05/06/2023 3:43 PM   Samantha Nguyen 1985/10/23 [id number]  Referring provider: Dulce Sellar, NP [address] [place],  [name] [address]  No chief complaint on file.   HPI: Samantha Nguyen is a 37yo here for evaluation of nephrolithiasis. She was last seen in 2018 for nephrolithiasis and underwent ureteroscopy for nephrolithiasis. She has passed numerous stone since she was last seen. CT shows a 9mm, 5mm, 4mm left renal calculus and 1-35mm right renal calculi. She is having intermittent left flank pain.    PMH: Past Medical History:  Diagnosis Date   Allergy 1985-05-31   Have had them my whole life   Anxiety    Chronic kidney disease    Costochondritis 09/02/2022   Depressed    History of kidney stones    MDD (major depressive disorder), recurrent episode, severe (HCC) 01/06/2023   MDD (major depressive disorder), single episode, severe (HCC) 04/28/2022   Migraine    Other chest pain 09/02/2022   Renal disorder    Tension-type headache, not intractable 07/27/2022    Surgical History: Past Surgical History:  Procedure Laterality Date   CESAREAN SECTION     CYSTOSCOPY W/ URETERAL STENT PLACEMENT Right 08/24/2016   Procedure: CYSTOSCOPY WITH RETROGRADE PYELOGRAM/URETERAL STENT PLACEMENT;  Surgeon: Malen Gauze, MD;  Location: [facility];  Service: Urology;  Laterality: Right;   CYSTOSCOPY WITH RETROGRADE PYELOGRAM, URETEROSCOPY AND STENT PLACEMENT Right 09/09/2016   Procedure: CYSTOSCOPY WITH RETROGRADE PYELOGRAM, URETEROSCOPY AND STENT REPLACEMENT;  Surgeon: Malen Gauze, MD;  Location[facility]  Service: Urology;  Laterality: Right;   MANDIBLE SURGERY      Home Medications:  Allergies as of 05/06/2023       Reactions   Sulfa Antibiotics Other (See Comments)   Unknown childhood reaction        Medication List        Accurate as of May 06, 2023  3:43 PM. If you have any questions, ask your nurse or doctor.           escitalopram 10 MG tablet Commonly known as: LEXAPRO Take 12.5 mg by mouth daily.   hydrOXYzine 25 MG capsule Commonly known as: VISTARIL Take 25 mg by mouth 3 (three) times daily as needed.   LORazepam 1 MG tablet Commonly known as: ATIVAN Take 1 tablet (1 mg total) by mouth daily as needed for anxiety (panic attacks). What changed: when to take this   VITAMIN B-12 SL Place 1 tablet under the tongue daily.   Vitamin D3 1.25 MG (50000 UT) Caps Take 1 capsule (1.25 mg total) by mouth once a week. AFTER the last weekly pill, start a daily OTC Vitamin D3 up to 4,000units. What changed:  when to take this additional instructions        Allergies:  Allergies  Allergen Reactions   Sulfa Antibiotics Other (See Comments)    Unknown childhood reaction    Family History: Family History  Problem Relation Age of Onset   Hypertension Mother    Ulcerative colitis Mother    Kidney disease Mother    Alcohol abuse Father    Drug abuse Father    Anxiety disorder Father    Drug abuse Brother    Kidney disease Brother    Anxiety disorder Maternal Grandfather    Cancer Maternal Grandfather    Anxiety disorder Maternal Grandmother    Kidney disease Sister     Social History:  reports that she has quit smoking. Her smoking use included  cigarettes. She has a 0.1 pack-year smoking history. She has never used smokeless tobacco. She reports current alcohol use. She reports that she does not use drugs.  ROS: All other review of systems were reviewed and are negative except what is noted above in HPI  Physical Exam: BP 129/89   Pulse 99   LMP 04/07/2023 (Approximate)   Constitutional:  Alert and oriented, No acute distress. HEENT: [place] AT, moist mucus membranes.  Trachea midline, no masses. Cardiovascular: No clubbing, cyanosis, or edema. Respiratory: Normal respiratory effort, no increased work of breathing. GI: Abdomen is soft, nontender, nondistended, no abdominal masses GU:  No CVA tenderness.  Lymph: No cervical or inguinal lymphadenopathy. Skin: No rashes, bruises or suspicious lesions. Neurologic: Grossly intact, no focal deficits, moving all 4 extremities. Psychiatric: Normal mood and affect.  Laboratory Data: Lab Results  Component Value Date   WBC 10.4 04/14/2023   HGB 14.2 04/14/2023   HCT 41.8 04/14/2023   MCV 85.3 04/14/2023   PLT 510 (H) 04/14/2023    Lab Results  Component Value Date   CREATININE 0.77 04/14/2023    No results found for: "PSA"  No results found for: "TESTOSTERONE"  No results found for: "HGBA1C"  Urinalysis    Component Value Date/Time   COLORURINE YELLOW 04/07/2023 1327   APPEARANCEUR CLEAR 04/07/2023 1327   LABSPEC 1.016 04/07/2023 1327   PHURINE 5.0 04/07/2023 1327   GLUCOSEU NEGATIVE 04/07/2023 1327   HGBUR SMALL (A) 04/07/2023 1327   BILIRUBINUR negaitve 05/06/2023 1535   KETONESUR NEGATIVE 04/07/2023 1327   PROTEINUR Positive (A) 05/06/2023 1535   PROTEINUR NEGATIVE 04/07/2023 1327   UROBILINOGEN 0.2 05/06/2023 1535   UROBILINOGEN 0.2 08/20/2016 1448   NITRITE negative 05/06/2023 1535   NITRITE NEGATIVE 04/07/2023 1327   LEUKOCYTESUR Negative 05/06/2023 1535   LEUKOCYTESUR NEGATIVE 04/07/2023 1327    Lab Results  Component Value Date   BACTERIA NONE SEEN 04/07/2023    Pertinent Imaging: CT 04/07/2023: Images reviewed and discussed with the patient  No results found for this or any previous visit.  No results found for this or any previous visit.  No results found for this or any previous visit.  No results found for this or any previous visit.  Results for orders placed during the hospital encounter of 04/05/23  US RENAL  Narrative CLINICAL DATA:  Flank pain  EXAM: RENAL / URINARY TRACT ULTRASOUND COMPLETE  COMPARISON:  01/01/2023 renal ultrasound.  CT 07/31/2021.  FINDINGS: Right Kidney:  Renal measurements: 10.1 x 3.8 x 4.5 cm = volume: 88.7 mL. Echogenicity within normal  limits. No mass or hydronephrosis visualized.  Left Kidney:  Renal measurements: 11.8 x 5.2 x 4.6 cm = volume: 149.8 mL. Possible duplicated collecting system. No collecting system dilatation. There are some echogenic shadowing areas in the left kidney consistent with a multiple stones measuring up to 9 mm. Tiny left-sided central cyst as well measuring 9 mm.  Bladder:  Appears normal for degree of bladder distention.  Other:  None.  IMPRESSION: No collecting system dilatation. 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   Electronically Signed By: Karen Kays M.D. On: 04/05/2023 17:45  No results found for this or any previous visit.  No results found for this or any previous visit.  Results for orders placed during the hospital encounter of 04/05/23  CT Renal Stone Study  Narrative CLINICAL DATA:  Nauseated and shaky since starting a new medication this morning. Abdominal/flank pain and diarrhea.  EXAM: CT ABDOMEN  AND PELVIS WITHOUT CONTRAST  TECHNIQUE: Multidetector CT imaging of the abdomen and pelvis was performed following the standard protocol without IV contrast.  RADIATION DOSE REDUCTION: This exam was performed according to the departmental dose-optimization program which includes automated exposure control, adjustment of the mA and/or kV according to patient size and/or use of iterative reconstruction technique.  COMPARISON:  07/31/2021  FINDINGS: Lower chest: No acute abnormality.  Hepatobiliary: Unremarkable liver. Normal gallbladder. No biliary dilation.  Pancreas: Unremarkable.  Spleen: Unremarkable.  Adrenals/Urinary Tract: Normal adrenal glands. Bilateral nonobstructing nephrolithiasis. No hydronephrosis. Bladder is unremarkable.  Stomach/Bowel: Normal caliber large and small bowel. No bowel wall thickening. The appendix is normal.Stomach is within normal limits.  Vascular/Lymphatic: No significant vascular  findings are present. No enlarged abdominal or pelvic lymph nodes.  Reproductive: Unremarkable.  Other: No free intraperitoneal fluid or air.  Musculoskeletal: No acute fracture. Chronic L5 pars defects with grade 1 anterolisthesis of L5.  IMPRESSION: 1. No acute abnormality in the abdomen or pelvis. 2. Bilateral nonobstructing nephrolithiasis. No hydronephrosis. 3. Chronic unchanged L5 spondylolysis with spondylolisthesis.   Electronically Signed By: Minerva Fester M.D. On: 04/05/2023 21:28   Assessment & Plan:    1. Kidney stones (Primary) -We discussed the management of kidney stones. These options include observation, ureteroscopy, shockwave lithotripsy (ESWL) and percutaneous nephrolithotomy (PCNL). We discussed which options are relevant to the patient's stone(s). We discussed the natural history of kidney stones as well as the complications of untreated stones and the impact on quality of life without treatment as well as with each of the above listed treatments. We also discussed the efficacy of each treatment in its ability to clear the stone burden. With any of these management options I discussed the signs and symptoms of infection and the need for emergent treatment should these be experienced. For each option we discussed the ability of each procedure to clear the patient of their stone burden.   For observation I described the risks which include but are not limited to silent renal damage, life-threatening infection, need for emergent surgery, failure to pass stone and pain.   For ureteroscopy I described the risks which include bleeding, infection, damage to contiguous structures, positioning injury, ureteral stricture, ureteral avulsion, ureteral injury, need for prolonged ureteral stent, inability to perform ureteroscopy, need for an interval procedure, inability to clear stone burden, stent discomfort/pain, heart attack, stroke, pulmonary embolus and the inherent risks  with general anesthesia.   For shockwave lithotripsy I described the risks which include arrhythmia, kidney contusion, kidney hemorrhage, need for transfusion, pain, inability to adequately break up stone, inability to pass stone fragments, Steinstrasse, infection associated with obstructing stones, need for alternate surgical procedure, need for repeat shockwave lithotripsy, MI, CVA, PE and the inherent risks with anesthesia/conscious sedation.   For PCNL I described the risks including positioning injury, pneumothorax, hydrothorax, need for chest tube, inability to clear stone burden, renal laceration, arterial venous fistula or malformation, need for embolization of kidney, loss of kidney or renal function, need for repeat procedure, need for prolonged nephrostomy tube, ureteral avulsion, MI, CVA, PE and the inherent risks of general anesthesia.   - The patient would like to proceed with bilateral ureteroscopic stone extraction. - POCT urinalysis dipstick   No follow-ups on file.  Wilkie Aye, MD  [facility] [place]

## 2023-05-06 NOTE — Progress Notes (Signed)
05/06/2023 3:43 PM   Samantha Nguyen 1985/10/23 [id number]  Referring provider: Dulce Sellar, NP [address] [place],  [name] [address]  No chief complaint on file.   HPI: Samantha Nguyen is a 37yo here for evaluation of nephrolithiasis. She was last seen in 2018 for nephrolithiasis and underwent ureteroscopy for nephrolithiasis. She has passed numerous stone since she was last seen. CT shows a 9mm, 5mm, 4mm left renal calculus and 1-35mm right renal calculi. She is having intermittent left flank pain.    PMH: Past Medical History:  Diagnosis Date   Allergy 1985-05-31   Have had them my whole life   Anxiety    Chronic kidney disease    Costochondritis 09/02/2022   Depressed    History of kidney stones    MDD (major depressive disorder), recurrent episode, severe (HCC) 01/06/2023   MDD (major depressive disorder), single episode, severe (HCC) 04/28/2022   Migraine    Other chest pain 09/02/2022   Renal disorder    Tension-type headache, not intractable 07/27/2022    Surgical History: Past Surgical History:  Procedure Laterality Date   CESAREAN SECTION     CYSTOSCOPY W/ URETERAL STENT PLACEMENT Right 08/24/2016   Procedure: CYSTOSCOPY WITH RETROGRADE PYELOGRAM/URETERAL STENT PLACEMENT;  Surgeon: Malen Gauze, MD;  Location: [facility];  Service: Urology;  Laterality: Right;   CYSTOSCOPY WITH RETROGRADE PYELOGRAM, URETEROSCOPY AND STENT PLACEMENT Right 09/09/2016   Procedure: CYSTOSCOPY WITH RETROGRADE PYELOGRAM, URETEROSCOPY AND STENT REPLACEMENT;  Surgeon: Malen Gauze, MD;  Location: [facility]  Service: Urology;  Laterality: Right;   MANDIBLE SURGERY      Home Medications:  Allergies as of 05/06/2023       Reactions   Sulfa Antibiotics Other (See Comments)   Unknown childhood reaction        Medication List        Accurate as of May 06, 2023  3:43 PM. If you have any questions, ask your nurse or doctor.           escitalopram 10 MG tablet Commonly known as: LEXAPRO Take 12.5 mg by mouth daily.   hydrOXYzine 25 MG capsule Commonly known as: VISTARIL Take 25 mg by mouth 3 (three) times daily as needed.   LORazepam 1 MG tablet Commonly known as: ATIVAN Take 1 tablet (1 mg total) by mouth daily as needed for anxiety (panic attacks). What changed: when to take this   VITAMIN B-12 SL Place 1 tablet under the tongue daily.   Vitamin D3 1.25 MG (50000 UT) Caps Take 1 capsule (1.25 mg total) by mouth once a week. AFTER the last weekly pill, start a daily OTC Vitamin D3 up to 4,000units. What changed:  when to take this additional instructions        Allergies:  Allergies  Allergen Reactions   Sulfa Antibiotics Other (See Comments)    Unknown childhood reaction    Family History: Family History  Problem Relation Age of Onset   Hypertension Mother    Ulcerative colitis Mother    Kidney disease Mother    Alcohol abuse Father    Drug abuse Father    Anxiety disorder Father    Drug abuse Brother    Kidney disease Brother    Anxiety disorder Maternal Grandfather    Cancer Maternal Grandfather    Anxiety disorder Maternal Grandmother    Kidney disease Sister     Social History:  reports that she has quit smoking. Her smoking use included  cigarettes. She has a 0.1 pack-year smoking history. She has never used smokeless tobacco. She reports current alcohol use. She reports that she does not use drugs.  ROS: All other review of systems were reviewed and are negative except what is noted above in HPI  Physical Exam: BP 129/89   Pulse 99   LMP 04/07/2023 (Approximate)   Constitutional:  Alert and oriented, No acute distress. HEENT: [place] AT, moist mucus membranes.  Trachea midline, no masses. Cardiovascular: No clubbing, cyanosis, or edema. Respiratory: Normal respiratory effort, no increased work of breathing. GI: Abdomen is soft, nontender, nondistended, no abdominal masses GU:  No CVA tenderness.  Lymph: No cervical or inguinal lymphadenopathy. Skin: No rashes, bruises or suspicious lesions. Neurologic: Grossly intact, no focal deficits, moving all 4 extremities. Psychiatric: Normal mood and affect.  Laboratory Data: Lab Results  Component Value Date   WBC 10.4 04/14/2023   HGB 14.2 04/14/2023   HCT 41.8 04/14/2023   MCV 85.3 04/14/2023   PLT 510 (H) 04/14/2023    Lab Results  Component Value Date   CREATININE 0.77 04/14/2023    No results found for: "PSA"  No results found for: "TESTOSTERONE"  No results found for: "HGBA1C"  Urinalysis    Component Value Date/Time   COLORURINE YELLOW 04/07/2023 1327   APPEARANCEUR CLEAR 04/07/2023 1327   LABSPEC 1.016 04/07/2023 1327   PHURINE 5.0 04/07/2023 1327   GLUCOSEU NEGATIVE 04/07/2023 1327   HGBUR SMALL (A) 04/07/2023 1327   BILIRUBINUR negaitve 05/06/2023 1535   KETONESUR NEGATIVE 04/07/2023 1327   PROTEINUR Positive (A) 05/06/2023 1535   PROTEINUR NEGATIVE 04/07/2023 1327   UROBILINOGEN 0.2 05/06/2023 1535   UROBILINOGEN 0.2 08/20/2016 1448   NITRITE negative 05/06/2023 1535   NITRITE NEGATIVE 04/07/2023 1327   LEUKOCYTESUR Negative 05/06/2023 1535   LEUKOCYTESUR NEGATIVE 04/07/2023 1327    Lab Results  Component Value Date   BACTERIA NONE SEEN 04/07/2023    Pertinent Imaging: CT 04/07/2023: Images reviewed and discussed with the patient  No results found for this or any previous visit.  No results found for this or any previous visit.  No results found for this or any previous visit.  No results found for this or any previous visit.  Results for orders placed during the hospital encounter of 04/05/23  US RENAL  Narrative CLINICAL DATA:  Flank pain  EXAM: RENAL / URINARY TRACT ULTRASOUND COMPLETE  COMPARISON:  01/01/2023 renal ultrasound.  CT 07/31/2021.  FINDINGS: Right Kidney:  Renal measurements: 10.1 x 3.8 x 4.5 cm = volume: 88.7 mL. Echogenicity within normal  limits. No mass or hydronephrosis visualized.  Left Kidney:  Renal measurements: 11.8 x 5.2 x 4.6 cm = volume: 149.8 mL. Possible duplicated collecting system. No collecting system dilatation. There are some echogenic shadowing areas in the left kidney consistent with a multiple stones measuring up to 9 mm. Tiny left-sided central cyst as well measuring 9 mm.  Bladder:  Appears normal for degree of bladder distention.  Other:  None.  IMPRESSION: No collecting system dilatation. 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   Electronically Signed By: Karen Kays M.D. On: 04/05/2023 17:45  No results found for this or any previous visit.  No results found for this or any previous visit.  Results for orders placed during the hospital encounter of 04/05/23  CT Renal Stone Study  Narrative CLINICAL DATA:  Nauseated and shaky since starting a new medication this morning. Abdominal/flank pain and diarrhea.  EXAM: CT ABDOMEN  AND PELVIS WITHOUT CONTRAST  TECHNIQUE: Multidetector CT imaging of the abdomen and pelvis was performed following the standard protocol without IV contrast.  RADIATION DOSE REDUCTION: This exam was performed according to the departmental dose-optimization program which includes automated exposure control, adjustment of the mA and/or kV according to patient size and/or use of iterative reconstruction technique.  COMPARISON:  07/31/2021  FINDINGS: Lower chest: No acute abnormality.  Hepatobiliary: Unremarkable liver. Normal gallbladder. No biliary dilation.  Pancreas: Unremarkable.  Spleen: Unremarkable.  Adrenals/Urinary Tract: Normal adrenal glands. Bilateral nonobstructing nephrolithiasis. No hydronephrosis. Bladder is unremarkable.  Stomach/Bowel: Normal caliber large and small bowel. No bowel wall thickening. The appendix is normal.Stomach is within normal limits.  Vascular/Lymphatic: No significant vascular  findings are present. No enlarged abdominal or pelvic lymph nodes.  Reproductive: Unremarkable.  Other: No free intraperitoneal fluid or air.  Musculoskeletal: No acute fracture. Chronic L5 pars defects with grade 1 anterolisthesis of L5.  IMPRESSION: 1. No acute abnormality in the abdomen or pelvis. 2. Bilateral nonobstructing nephrolithiasis. No hydronephrosis. 3. Chronic unchanged L5 spondylolysis with spondylolisthesis.   Electronically Signed By: Minerva Fester M.D. On: 04/05/2023 21:28   Assessment & Plan:    1. Kidney stones (Primary) -We discussed the management of kidney stones. These options include observation, ureteroscopy, shockwave lithotripsy (ESWL) and percutaneous nephrolithotomy (PCNL). We discussed which options are relevant to the patient's stone(s). We discussed the natural history of kidney stones as well as the complications of untreated stones and the impact on quality of life without treatment as well as with each of the above listed treatments. We also discussed the efficacy of each treatment in its ability to clear the stone burden. With any of these management options I discussed the signs and symptoms of infection and the need for emergent treatment should these be experienced. For each option we discussed the ability of each procedure to clear the patient of their stone burden.   For observation I described the risks which include but are not limited to silent renal damage, life-threatening infection, need for emergent surgery, failure to pass stone and pain.   For ureteroscopy I described the risks which include bleeding, infection, damage to contiguous structures, positioning injury, ureteral stricture, ureteral avulsion, ureteral injury, need for prolonged ureteral stent, inability to perform ureteroscopy, need for an interval procedure, inability to clear stone burden, stent discomfort/pain, heart attack, stroke, pulmonary embolus and the inherent risks  with general anesthesia.   For shockwave lithotripsy I described the risks which include arrhythmia, kidney contusion, kidney hemorrhage, need for transfusion, pain, inability to adequately break up stone, inability to pass stone fragments, Steinstrasse, infection associated with obstructing stones, need for alternate surgical procedure, need for repeat shockwave lithotripsy, MI, CVA, PE and the inherent risks with anesthesia/conscious sedation.   For PCNL I described the risks including positioning injury, pneumothorax, hydrothorax, need for chest tube, inability to clear stone burden, renal laceration, arterial venous fistula or malformation, need for embolization of kidney, loss of kidney or renal function, need for repeat procedure, need for prolonged nephrostomy tube, ureteral avulsion, MI, CVA, PE and the inherent risks of general anesthesia.   - The patient would like to proceed with bilateral ureteroscopic stone extraction. - POCT urinalysis dipstick   No follow-ups on file.  Wilkie Aye, MD  [facility] [place]

## 2023-05-08 ENCOUNTER — Emergency Department (HOSPITAL_COMMUNITY): Payer: MEDICAID

## 2023-05-08 ENCOUNTER — Encounter (HOSPITAL_COMMUNITY): Payer: Self-pay

## 2023-05-08 ENCOUNTER — Other Ambulatory Visit: Payer: Self-pay

## 2023-05-08 ENCOUNTER — Emergency Department (HOSPITAL_COMMUNITY)
Admission: EM | Admit: 2023-05-08 | Discharge: 2023-05-08 | Disposition: A | Discharge status: Home / Self Care | Payer: MEDICAID | Attending: Emergency Medicine | Admitting: Emergency Medicine

## 2023-05-08 DIAGNOSIS — N200 Calculus of kidney: Secondary | ICD-10-CM | POA: Diagnosis not present

## 2023-05-08 DIAGNOSIS — R109 Unspecified abdominal pain: Secondary | ICD-10-CM | POA: Diagnosis present

## 2023-05-08 LAB — BASIC METABOLIC PANEL
Anion gap: 13 (ref 5–15)
BUN: 6 mg/dL (ref 6–20)
CO2: 22 mmol/L (ref 22–32)
Calcium: 9.6 mg/dL (ref 8.9–10.3)
Chloride: 104 mmol/L (ref 98–111)
Creatinine, Ser: 0.78 mg/dL (ref 0.44–1.00)
GFR, Estimated: >60 mL/min (ref >60)
Glucose, Bld: 96 mg/dL (ref 70–99)
Potassium: 4.6 mmol/L (ref 3.5–5.1)
Sodium: 139 mmol/L (ref 135–145)

## 2023-05-08 LAB — CBC
HCT: 39.3 % (ref 36.0–46.0)
Hemoglobin: 13.1 g/dL (ref 12.0–15.0)
MCH: 29.2 pg (ref 26.0–34.0)
MCHC: 33.3 g/dL (ref 30.0–36.0)
MCV: 87.5 fL (ref 80.0–100.0)
Platelets: 384 10*3/uL (ref 150–400)
RBC: 4.49 MIL/uL (ref 3.87–5.11)
RDW: 12.5 % (ref 11.5–15.5)
WBC: 8.1 10*3/uL (ref 4.0–10.5)
nRBC: 0 % (ref 0.0–0.2)

## 2023-05-08 LAB — URINALYSIS, ROUTINE W REFLEX MICROSCOPIC
Bilirubin Urine: NEGATIVE
Glucose, UA: NEGATIVE mg/dL
Ketones, ur: 20 mg/dL — AB
Leukocytes,Ua: NEGATIVE
Nitrite: NEGATIVE
Protein, ur: 100 mg/dL — AB
RBC / HPF: >50 RBC/hpf (ref 0–5)
Specific Gravity, Urine: 1.01 (ref 1.005–1.030)
pH: 6 (ref 5.0–8.0)

## 2023-05-08 LAB — HCG, SERUM, QUALITATIVE: Preg, Serum: NEGATIVE

## 2023-05-08 MED ORDER — OXYCODONE-ACETAMINOPHEN 5-325 MG PO TABS: 1.0000 | ORAL_TABLET | Freq: Four times a day (QID) | ORAL | 0 refills | Status: DC | PRN

## 2023-05-08 MED ORDER — OXYCODONE-ACETAMINOPHEN 5-325 MG PO TABS
1.0000 | ORAL_TABLET | Freq: Once | ORAL | Status: AC
  Administered 2023-05-08: 1 via ORAL
  Filled 2023-05-08: qty 1

## 2023-05-08 NOTE — Discharge Instructions (Addendum)
 Start using the Flomax that you were prescribed yesterday.  Also you can use the pain medication as needed.  Just do not take it at the same time as the Ativan.

## 2023-05-08 NOTE — ED Provider Notes (Addendum)
  EMERGENCY DEPARTMENT AT [facility] Provider Note   CSN: [id number] Arrival date & time: 05/08/23  1049     History  Chief Complaint  Patient presents with   Flank Pain   Nausea   Hematuria    Samantha Nguyen is a 38 y.o. female.  Patient is a 38 year old female with a history of anxiety, recurrent kidney stones who is presenting today due to ongoing pain in her left pelvis.  She was seen at [facility] yesterday had imaging done that showed a 6 mm UPJ stone on the left with normal labs.  Patient has been taking Tylenol at home but reports it is not helping and she is very concerned that she is going to get sepsis and wants the stone taken out today.  She has not had any nausea vomiting or fever.  She does report hematuria.  Pain is in the left pelvis and radiates to the flank.  The history is provided by the patient and medical records.  Flank Pain  Hematuria       Home Medications Prior to Admission medications   Medication Sig Start Date End Date Taking? Authorizing Provider  oxyCODONE-acetaminophen (PERCOCET/ROXICET) 5-325 MG tablet Take 1 tablet by mouth every 6 (six) hours as needed for severe pain (pain score 7-10). 05/08/23  Yes Gwyneth Sprout, MD  Cholecalciferol (VITAMIN D3) 1.25 MG (50000 UT) CAPS Take 1 capsule (1.25 mg total) by mouth once a week. AFTER the last weekly pill, start a daily OTC Vitamin D3 up to 4,000units. Patient taking differently: Take 1 capsule by mouth every Saturday. 03/25/23   Dulce Sellar, NP  Cyanocobalamin (VITAMIN B-12 SL) Place 1 tablet under the tongue daily.    [provider]  escitalopram (LEXAPRO) 10 MG tablet Take 12.5 mg by mouth daily. 03/21/23   [provider]  hydrOXYzine (VISTARIL) 25 MG capsule Take 25 mg by mouth 3 (three) times daily as needed. 04/14/23   [provider]  LORazepam (ATIVAN) 1 MG tablet Take 1 tablet (1 mg total) by mouth daily as needed for anxiety (panic  attacks). Patient taking differently: Take 1 mg by mouth 3 (three) times daily as needed for anxiety (panic attacks). 04/14/23   Gareth Eagle, PA-C      Allergies    Sulfa antibiotics    Review of Systems   Review of Systems  Genitourinary:  Positive for flank pain and hematuria.    Physical Exam Updated Vital Signs BP 126/83 (BP Location: Right Arm)   Pulse (!) 103   Temp 98.9 F (37.2 C) (Oral)   Resp 14   Ht 5\' 7"  (1.702 m)   Wt 66.2 kg   LMP 04/07/2023 (Approximate)   SpO2 99%   BMI 22.87 kg/m  Physical Exam Vitals and nursing note reviewed.  Constitutional:      General: She is not in acute distress.    Appearance: She is well-developed.  HENT:     Head: Normocephalic and atraumatic.  Eyes:     Pupils: Pupils are equal, round, and reactive to light.  Cardiovascular:     Rate and Rhythm: Normal rate and regular rhythm.     Heart sounds: Normal heart sounds. No murmur heard.    No friction rub.  Pulmonary:     Effort: Pulmonary effort is normal.     Breath sounds: Normal breath sounds. No wheezing or rales.  Abdominal:     General: Bowel sounds are normal. There is no distension.  Palpations: Abdomen is soft.     Tenderness: There is abdominal tenderness in the left lower quadrant. There is left CVA tenderness. There is no guarding or rebound.  Musculoskeletal:        General: No tenderness. Normal range of motion.     Comments: No edema  Skin:    General: Skin is warm and dry.     Findings: No rash.  Neurological:     Mental Status: She is alert and oriented to person, place, and time.     Cranial Nerves: No cranial nerve deficit.  Psychiatric:        Mood and Affect: Mood is anxious.        Behavior: Behavior normal.     ED Results / Procedures / Treatments   Labs (all labs ordered are listed, but only abnormal results are displayed) Labs Reviewed  URINALYSIS, ROUTINE W REFLEX MICROSCOPIC - Abnormal; Notable for the following components:       Result Value   APPearance HAZY (*)    Hgb urine dipstick LARGE (*)    Ketones, ur 20 (*)    Protein, ur 100 (*)    Bacteria, UA RARE (*)    All other components within normal limits  HCG, SERUM, QUALITATIVE  BASIC METABOLIC PANEL  CBC    EKG None  Radiology No results found.  Procedures Procedures    Medications Ordered in ED Medications  oxyCODONE-acetaminophen (PERCOCET/ROXICET) 5-325 MG per tablet 1 tablet (1 tablet Oral Given 05/08/23 1256)    ED Course/ Medical Decision Making/ A&P                                 Medical Decision Making Amount and/or Complexity of Data Reviewed Labs: ordered. Decision-making details documented in ED Course.  Risk Prescription drug management.   Pt with symptoms consistent with kidney stone and had imaging and lab work done yesterday at [facility] that are consistent with this.  She has a 6 mm stone at her UPJ.  However patient in the past has had sepsis from a retained stone and is very anxious that that could happen again.  She has not had any fevers nausea or vomiting but is having a lot of left-sided pelvic pain.  She initially reports only taking Tylenol because she is afraid any other medication may interfere with her chronic medications.  Denies infectious sx.   I independently interpreted patient's labs and BMP, CBC are both within normal limits.  UA with greater than 50 red cells and only 11-21 white cells with rare bacteria and suspect this is all from stone passing.  Low suspicion for infected stone at this time.  Encouraged her to follow-up with urology as planned and start taking the Flomax she was prescribed yesterday.  Pain controlled after a dose of Percocet.        Final Clinical Impression(s) / ED Diagnoses Final diagnoses:  Kidney stone    Rx / DC Orders ED Discharge Orders          Ordered    oxyCODONE-acetaminophen (PERCOCET/ROXICET) 5-325 MG tablet  Every 6 hours PRN        05/08/23 1431               Gwyneth Sprout, MD 05/08/23 1515    Gwyneth Sprout, MD 05/08/23 1515

## 2023-05-08 NOTE — ED Triage Notes (Signed)
 Pt c/o L flank pain radiating into LLQ, nausea, and hematuria x"a couple weeks."  Pain score 5/10.  Pt was seen at a hospital in [facility] yesterday for the same and was diagnosed w/ kidney stones.  Pt did not get her prescriptions filled.    Pt is incredibly anxious.

## 2023-05-09 ENCOUNTER — Telehealth: Payer: Self-pay

## 2023-05-09 ENCOUNTER — Telehealth: Payer: Self-pay | Admitting: Urology

## 2023-05-09 ENCOUNTER — Ambulatory Visit: Payer: MEDICAID | Admitting: Urology

## 2023-05-09 DIAGNOSIS — N200 Calculus of kidney: Secondary | ICD-10-CM

## 2023-05-09 NOTE — Telephone Encounter (Signed)
FYI and please advise.

## 2023-05-09 NOTE — Telephone Encounter (Signed)
 Was seen on Friday then went to ER, was told to call us first thing by on call. Kidney stone is moving and in major pain.

## 2023-05-09 NOTE — Telephone Encounter (Signed)
 Pt called because she is "extremely anxious, wondering if there's anything she can do to help with pain, Pt stated she is worried she is going to die. Pt also wanted to know when/how soon she could get surgery. Pt advised surgery scheduler will give her a call tomorrow to f/u with her.

## 2023-05-09 NOTE — Telephone Encounter (Signed)
 Return call to patient. Patient states that she can not take Zofran or any opoid medication because it it interact with her psych medication Ativan. Patient is aware a message will be sent to provider on alternative. Patient voiced understanding.

## 2023-05-09 NOTE — Telephone Encounter (Signed)
 Patient called this morning she wants to speak with Dr Ronne Binning about the CT results from Saturday, they wouldn't tell her anything when she was there and she has questions.    Told patient that Guss Bunde would call her 05/10/23 about her surgery,  she was wanting to know if she can come in today to see [facility]

## 2023-05-09 NOTE — Telephone Encounter (Addendum)
 Return called to patient to see if she if she can have Phenergan 12.5 mg for nauseas. Patient state's it interact with psych  medication. Patient can not take any nauseas medication. Patient is aware and message will be sent to the MD. Patient voiced understanding.

## 2023-05-10 ENCOUNTER — Inpatient Hospital Stay (HOSPITAL_COMMUNITY)
Admission: RE | Admit: 2023-05-10 | Discharge: 2023-05-10 | Disposition: A | Discharge status: Home / Self Care | Payer: MEDICAID | Source: Ambulatory Visit

## 2023-05-10 ENCOUNTER — Encounter: Payer: Self-pay | Admitting: Urology

## 2023-05-10 DIAGNOSIS — Z01818 Encounter for other preprocedural examination: Secondary | ICD-10-CM

## 2023-05-10 NOTE — Telephone Encounter (Signed)
 Verbal confirmation from Dr. Ronne Binning, ok to create work note for pt spouse for transportation need day of surgery.  Work note created and send via Clinical cytogeneticist.

## 2023-05-10 NOTE — Telephone Encounter (Signed)
 Please see pt concerns below and address with Dr. Ronne Binning when he is back in office. I answered what questions I could answer within my scope of practice.

## 2023-05-10 NOTE — Progress Notes (Addendum)
 Samantha Nguyen called this morning regarding Surgery 05/16/2023 by Dr Ronne Binning.  She was very anxious about Anesthesia "I'm afraid I'm going to die".  I explained to her the anesthesiologist would talk to her prior to her procedure.  A Certified Registered Nurse anesthetist would be at herside during the surgery, monitoring her vital signs the whole time. I reviewed NPO after midnight and she could have clear liquids until 10 am.  She is to arrive at 12 noon.   She called back 2 more times with the same concerns about anesthesia.  I reassured her again and I told her I would Get Dr. Leta Jungling to call her this afternoon if possible and I let her know he would not the the anesthesiologist on Monday but he would talk to her.  She has an appointment with her therapist this afternoon.

## 2023-05-10 NOTE — Progress Notes (Signed)
 Pt requested note be sent via mychart.

## 2023-05-11 ENCOUNTER — Telehealth: Payer: Self-pay | Admitting: Urology

## 2023-05-11 ENCOUNTER — Telehealth: Payer: Self-pay

## 2023-05-11 ENCOUNTER — Other Ambulatory Visit: Payer: MEDICAID

## 2023-05-11 ENCOUNTER — Emergency Department (HOSPITAL_BASED_OUTPATIENT_CLINIC_OR_DEPARTMENT_OTHER): Payer: MEDICAID

## 2023-05-11 ENCOUNTER — Encounter (HOSPITAL_BASED_OUTPATIENT_CLINIC_OR_DEPARTMENT_OTHER): Payer: Self-pay

## 2023-05-11 ENCOUNTER — Ambulatory Visit: Payer: MEDICAID

## 2023-05-11 ENCOUNTER — Emergency Department (HOSPITAL_BASED_OUTPATIENT_CLINIC_OR_DEPARTMENT_OTHER)
Admission: EM | Admit: 2023-05-11 | Discharge: 2023-05-11 | Disposition: A | Discharge status: Home / Self Care | Payer: MEDICAID | Attending: Emergency Medicine | Admitting: Emergency Medicine

## 2023-05-11 ENCOUNTER — Other Ambulatory Visit: Payer: Self-pay

## 2023-05-11 DIAGNOSIS — N390 Urinary tract infection, site not specified: Secondary | ICD-10-CM

## 2023-05-11 DIAGNOSIS — R102 Pelvic and perineal pain: Secondary | ICD-10-CM | POA: Insufficient documentation

## 2023-05-11 DIAGNOSIS — N201 Calculus of ureter: Secondary | ICD-10-CM

## 2023-05-11 LAB — CBC WITH DIFFERENTIAL/PLATELET
Abs Immature Granulocytes: 0.02 10*3/uL (ref 0.00–0.07)
Basophils Absolute: 0 10*3/uL (ref 0.0–0.1)
Basophils Relative: 0 %
Eosinophils Absolute: 0 10*3/uL (ref 0.0–0.5)
Eosinophils Relative: 1 %
HCT: 39.2 % (ref 36.0–46.0)
Hemoglobin: 12.9 g/dL (ref 12.0–15.0)
Immature Granulocytes: 0 %
Lymphocytes Relative: 22 %
Lymphs Abs: 1.6 10*3/uL (ref 0.7–4.0)
MCH: 28.9 pg (ref 26.0–34.0)
MCHC: 32.9 g/dL (ref 30.0–36.0)
MCV: 87.9 fL (ref 80.0–100.0)
Monocytes Absolute: 0.6 10*3/uL (ref 0.1–1.0)
Monocytes Relative: 8 %
Neutro Abs: 5.1 10*3/uL (ref 1.7–7.7)
Neutrophils Relative %: 69 %
Platelets: 360 10*3/uL (ref 150–400)
RBC: 4.46 MIL/uL (ref 3.87–5.11)
RDW: 12.6 % (ref 11.5–15.5)
WBC: 7.4 10*3/uL (ref 4.0–10.5)
nRBC: 0 % (ref 0.0–0.2)

## 2023-05-11 LAB — URINALYSIS, ROUTINE W REFLEX MICROSCOPIC
Bilirubin Urine: NEGATIVE
Bilirubin, UA: NEGATIVE
Glucose, UA: NEGATIVE mg/dL
Glucose, UA: NEGATIVE
Ketones, UA: NEGATIVE
Ketones, ur: NEGATIVE mg/dL
Nitrite, UA: NEGATIVE
Nitrite: NEGATIVE
Protein, ur: NEGATIVE mg/dL
Specific Gravity, UA: 1.02 (ref 1.005–1.030)
Specific Gravity, Urine: <1.005 — ABNORMAL LOW (ref 1.005–1.030)
Urobilinogen, Ur: 0.2 mg/dL (ref 0.2–1.0)
pH, UA: 6 (ref 5.0–7.5)
pH: 6 (ref 5.0–8.0)

## 2023-05-11 LAB — BASIC METABOLIC PANEL
Anion gap: 10 (ref 5–15)
BUN: <5 mg/dL — ABNORMAL LOW (ref 6–20)
CO2: 24 mmol/L (ref 22–32)
Calcium: 9.6 mg/dL (ref 8.9–10.3)
Chloride: 104 mmol/L (ref 98–111)
Creatinine, Ser: 0.79 mg/dL (ref 0.44–1.00)
GFR, Estimated: >60 mL/min (ref >60)
Glucose, Bld: 101 mg/dL — ABNORMAL HIGH (ref 70–99)
Potassium: 3.4 mmol/L — ABNORMAL LOW (ref 3.5–5.1)
Sodium: 138 mmol/L (ref 135–145)

## 2023-05-11 LAB — MICROSCOPIC EXAMINATION: RBC, Urine: >30 /[HPF] — AB (ref 0–2)

## 2023-05-11 LAB — PREGNANCY, URINE: Preg Test, Ur: NEGATIVE

## 2023-05-11 MED ORDER — LORAZEPAM 1 MG PO TABS
1.0000 mg | ORAL_TABLET | Freq: Once | ORAL | Status: AC
  Administered 2023-05-11: 1 mg via ORAL
  Filled 2023-05-11: qty 1

## 2023-05-11 MED ORDER — LORAZEPAM 2 MG/ML IJ SOLN
1.0000 mg | Freq: Once | INTRAMUSCULAR | Status: AC
  Administered 2023-05-11: 1 mg via INTRAVENOUS
  Filled 2023-05-11: qty 1

## 2023-05-11 MED ORDER — KETOROLAC TROMETHAMINE 15 MG/ML IJ SOLN
15.0000 mg | Freq: Once | INTRAMUSCULAR | Status: AC
  Administered 2023-05-11: 15 mg via INTRAVENOUS
  Filled 2023-05-11: qty 1

## 2023-05-11 MED ORDER — LACTATED RINGERS IV BOLUS
1000.0000 mL | Freq: Once | INTRAVENOUS | Status: AC
  Administered 2023-05-11: 1000 mL via INTRAVENOUS

## 2023-05-11 NOTE — Discharge Instructions (Signed)
 Please keep your appointment with urology for surgery.

## 2023-05-11 NOTE — ED Triage Notes (Signed)
 Present with bilateral flank pain.  Has kidney stones bilaterally.  Having lower pelvic pain.  Urine cloudy.

## 2023-05-11 NOTE — ED Notes (Signed)
 Called lab to run upreg, spoke with [place].

## 2023-05-11 NOTE — ED Provider Notes (Signed)
 [place] EMERGENCY DEPARTMENT AT [facility] [address] Provider Note   CSN: [id number] Arrival date & time: 05/11/23  1113     History  Chief Complaint  Patient presents with   Flank Pain    Samantha Nguyen is a 38 y.o. female.  HPI 38 year old female presents with flank pain.  She has been having on and off left-sided flank pain for about 2 months and went to an outside ER 5 days ago for hematuria.  Was found to have a proximal left ureteral stone.  Is due to have a procedure by Dr. Ronne Binning in about a week. Urine became cloudy yesterday. No specific dysuria. No fevers, vomiting. Over the past 24 hours she's also having right flank pain. Is feeling anxious and scared about sepsis, which she's had before and is afraid she is going to die. She has had increased urination, but also was put on flomax.  Pain is moderate at this time.  Home Medications Prior to Admission medications   Medication Sig Start Date End Date Taking? Authorizing Provider  Cholecalciferol (VITAMIN D3) 1.25 MG (50000 UT) CAPS Take 1 capsule (1.25 mg total) by mouth once a week. AFTER the last weekly pill, start a daily OTC Vitamin D3 up to 4,000units. Patient taking differently: Take 1 capsule by mouth every Saturday. 03/25/23   Dulce Sellar, NP  cyanocobalamin (VITAMIN B12) 1000 MCG tablet Take 1,000 mcg by mouth daily.    [provider]  escitalopram (LEXAPRO) 10 MG tablet Take 15 mg by mouth daily. 03/21/23   [provider]  LORazepam (ATIVAN) 1 MG tablet Take 1 tablet (1 mg total) by mouth daily as needed for anxiety (panic attacks). Patient taking differently: Take 1 mg by mouth 3 (three) times daily as needed for anxiety (panic attacks). 04/14/23   Gareth Eagle, PA-C  mirtazapine (REMERON) 7.5 MG tablet Take 3.75 mg by mouth at bedtime as needed (sleep). 05/03/23   [provider]  oxyCODONE-acetaminophen (PERCOCET/ROXICET) 5-325 MG tablet Take 1 tablet by mouth every 6 (six)  hours as needed for severe pain (pain score 7-10). 05/08/23   Gwyneth Sprout, MD  tamsulosin (FLOMAX) 0.4 MG CAPS capsule Take 1 capsule by mouth daily. 05/07/23   [provider]      Allergies    Sulfa antibiotics    Review of Systems   Review of Systems  Constitutional:  Negative for fever.  Gastrointestinal:  Negative for vomiting.  Genitourinary:  Positive for flank pain. Negative for dysuria.  Psychiatric/Behavioral:  The patient is nervous/anxious.     Physical Exam Updated Vital Signs BP 131/88   Pulse 100   Temp 98.4 F (36.9 C) (Oral)   Resp 17   Ht 5\' 7"  (1.702 m)   Wt 66.2 kg   LMP 04/07/2023 (Approximate)   SpO2 100%   BMI 22.86 kg/m  Physical Exam Vitals and nursing note reviewed.  Constitutional:      Appearance: She is well-developed. She is not diaphoretic.  HENT:     Head: Normocephalic and atraumatic.  Cardiovascular:     Rate and Rhythm: Regular rhythm. Tachycardia present.     Heart sounds: Normal heart sounds.  Pulmonary:     Effort: Pulmonary effort is normal.     Breath sounds: Normal breath sounds.  Abdominal:     Palpations: Abdomen is soft.     Tenderness: There is no abdominal tenderness. There is right CVA tenderness and left CVA tenderness.  Skin:    General:  Skin is warm and dry.  Neurological:     Mental Status: She is alert.  Psychiatric:        Mood and Affect: Mood is anxious.    ED Results / Procedures / Treatments   Labs (all labs ordered are listed, but only abnormal results are displayed) Labs Reviewed  URINALYSIS, ROUTINE W REFLEX MICROSCOPIC - Abnormal; Notable for the following components:      Result Value   Color, Urine COLORLESS (*)    Specific Gravity, Urine <1.005 (*)    Hgb urine dipstick MODERATE (*)    Leukocytes,Ua TRACE (*)    Bacteria, UA RARE (*)    All other components within normal limits  BASIC METABOLIC PANEL - Abnormal; Notable for the following components:   Potassium 3.4 (*)     Glucose, Bld 101 (*)    BUN <5 (*)    All other components within normal limits  CBC WITH DIFFERENTIAL/PLATELET  PREGNANCY, URINE    EKG None  Radiology US Renal Result Date: 05/11/2023 CLINICAL DATA:  Bilateral flank pain.  History renal stones. EXAM: RENAL / URINARY TRACT ULTRASOUND COMPLETE COMPARISON:  CT abdomen/pelvis dated 04/07/2023. FINDINGS: Right Kidney: Renal measurements: 10.2 x 3.9 x 5.9 cm = volume: 122.4 mL. Echogenicity within normal limits. Multiple echogenic foci with shadowing, compatible with stones, measuring up to 8 mm. No hydronephrosis visualized. Left Kidney: Renal measurements: 12 x 4.6 x 4.7 cm = volume: 137 mL. Echogenicity within normal limits. Multiple echogenic foci with shadowing, compatible with stones, measuring up to 7 mm. No hydronephrosis visualized. Bladder: Appears normal for degree of bladder distention. Other: None. IMPRESSION: Bilateral nonobstructive nephrolithiasis. No hydronephrosis visualized. Electronically Signed   By: Hart Robinsons M.D.   On: 05/11/2023 13:35    Procedures Procedures    Medications Ordered in ED Medications  ketorolac (TORADOL) 15 MG/ML injection 15 mg (15 mg Intravenous Given 05/11/23 1152)  LORazepam (ATIVAN) injection 1 mg (1 mg Intravenous Given 05/11/23 1153)  lactated ringers bolus 1,000 mL (0 mLs Intravenous Stopped 05/11/23 1341)  LORazepam (ATIVAN) tablet 1 mg (1 mg Oral Given 05/11/23 1407)    ED Course/ Medical Decision Making/ A&P Clinical Course as of 05/11/23 1443  Wed May 11, 2023  1151 Patient is anxious but otherwise does not appear critically ill at this time.  I discussed options with patient and she does not want a CT scan as she is concerned about too much radiation exposure as she has had a few CT scans since January.  She wants to start with an ultrasound.  We discussed the possible limitations as it is important to rule out that she has bilateral obstructing stones.  Will start with ultrasound and  will treat anxiety and pain for now.  Pain is moderate. [SG]    Clinical Course User Index [SG] Pricilla Loveless, MD                                 Medical Decision Making Amount and/or Complexity of Data Reviewed External Data Reviewed: radiology and notes. Labs: ordered.    Details: Normal WBC.  Normal renal function.  UA not consistent with UTI though does have some hematuria. Radiology: ordered and independent interpretation performed.    Details: No hydronephrosis.  Risk Prescription drug management.   Patient is quite worried about radiation as above and is overall quite anxious.  I have tried to reassure her given the  normal WBC, urine not consistent with UTI, and reassuring ultrasound that she requested.  This includes that there is no hydronephrosis on the left side which was the affected side on the CT a few days ago.  She is now concerned because there is a possible diagnosis of urinary tract infection though on my view of the chart it seems like this is the diagnosis used to order her urinalysis and urine culture from the urology office which has not yet started to be in process.  On my chart review there is no indication of UTI on the most recent urinalysis obtained.  She otherwise does not appear clinically ill.  She requested an abdominal x-ray to see if we could see any stones.  Care transferred to Dr. Wallace Cullens with Xray and disposition pending.        Final Clinical Impression(s) / ED Diagnoses Final diagnoses:  None    Rx / DC Orders ED Discharge Orders     None         Pricilla Loveless, MD 05/11/23 [phone]

## 2023-05-11 NOTE — Telephone Encounter (Signed)
 Did urine sample for surgery today and she states my chart says she has a UTI needs to know what to do.

## 2023-05-11 NOTE — ED Provider Notes (Signed)
 3:06 PM Patient signed out to me by previous ED physician. Pt is a 38 yo female presenting for cloudy urine after dx of kidney stone. Has o/p follow up with urology for lithotripsy.   US demonstrates no hydro on the left or right. UA demonstrates no UTI. Pt requested an Xray. Xray pending to Dc.      Physical Exam  BP 131/88   Pulse 100   Temp 98.4 F (36.9 C) (Oral)   Resp 17   Ht 5\' 7"  (1.702 m)   Wt 66.2 kg   LMP 04/07/2023 (Approximate)   SpO2 100%   BMI 22.86 kg/m   Physical Exam  Procedures  Procedures  ED Course / MDM   Clinical Course as of 05/11/23 1506  Wed May 11, 2023  1151 Patient is anxious but otherwise does not appear critically ill at this time.  I discussed options with patient and she does not want a CT scan as she is concerned about too much radiation exposure as she has had a few CT scans since January.  She wants to start with an ultrasound.  We discussed the possible limitations as it is important to rule out that she has bilateral obstructing stones.  Will start with ultrasound and will treat anxiety and pain for now.  Pain is moderate. [SG]    Clinical Course User Index [SG] Pricilla Loveless, MD   Medical Decision Making Amount and/or Complexity of Data Reviewed Labs: ordered. Radiology: ordered.  Risk Prescription drug management.   X-ray stable.  Patient has left-sided stone that is moved down to the UVJ.  UA demonstrates no UTI.  Patient stable and nontoxic-appearing.  Safe for follow-up with urology for surgery.       Edwin Dada P, DO 05/11/23 1818

## 2023-05-11 NOTE — Telephone Encounter (Addendum)
  I noticed tonight my urine is cloudy. I also have had pain on my right side Pain in front of abdomen in front of bladder. Patient is advised to do a urine drop off. Dysuria  Patient called with c/o dysuria x 1 days.  Pain: constant  Severity:4/10  Associated Signs and Symptoms:  Fever: noTemp.0 Chills: no Hematuria: no Urgency: no Frequency: no Hesitancy:no Incontinence: no Nausea: yes Vomiting: no  Urologic History:  Any Recent Urologic Surgeries or Procedures:no Recurrent UTI's:no Cystitis: no  Prostatitis:no Kidney or Bladder Stones: yes Plan: Walk-in Clinic: Lab urine drop off Appointment w/Physician: [no Lab visit scheduled for urine drop off: Yes Advice given:  Do you take on daily medications for UTI suppression No

## 2023-05-11 NOTE — ED Notes (Signed)
 HCG serum qual was not drawn. No tube in lab per [place]. Notified Dr. Criss Alvine via secure chat. VO to D/C serum qual and add upreg. Urine in lab. Will call lab to run.

## 2023-05-12 ENCOUNTER — Encounter: Payer: Self-pay | Admitting: Urology

## 2023-05-12 ENCOUNTER — Emergency Department (HOSPITAL_BASED_OUTPATIENT_CLINIC_OR_DEPARTMENT_OTHER)
Admission: EM | Admit: 2023-05-12 | Discharge: 2023-05-12 | Disposition: A | Discharge status: Home / Self Care | Payer: MEDICAID | Attending: Emergency Medicine | Admitting: Emergency Medicine

## 2023-05-12 ENCOUNTER — Encounter (HOSPITAL_BASED_OUTPATIENT_CLINIC_OR_DEPARTMENT_OTHER): Payer: Self-pay | Admitting: Emergency Medicine

## 2023-05-12 DIAGNOSIS — N200 Calculus of kidney: Secondary | ICD-10-CM | POA: Insufficient documentation

## 2023-05-12 DIAGNOSIS — E876 Hypokalemia: Secondary | ICD-10-CM | POA: Insufficient documentation

## 2023-05-12 DIAGNOSIS — F419 Anxiety disorder, unspecified: Secondary | ICD-10-CM | POA: Insufficient documentation

## 2023-05-12 DIAGNOSIS — R319 Hematuria, unspecified: Secondary | ICD-10-CM | POA: Diagnosis present

## 2023-05-12 DIAGNOSIS — R3129 Other microscopic hematuria: Secondary | ICD-10-CM

## 2023-05-12 LAB — COMPREHENSIVE METABOLIC PANEL
ALT: 13 U/L (ref 0–44)
AST: 15 U/L (ref 15–41)
Albumin: 4.8 g/dL (ref 3.5–5.0)
Alkaline Phosphatase: 51 U/L (ref 38–126)
Anion gap: 11 (ref 5–15)
BUN: 7 mg/dL (ref 6–20)
CO2: 24 mmol/L (ref 22–32)
Calcium: 9.7 mg/dL (ref 8.9–10.3)
Chloride: 105 mmol/L (ref 98–111)
Creatinine, Ser: 0.79 mg/dL (ref 0.44–1.00)
GFR, Estimated: >60 mL/min (ref >60)
Glucose, Bld: 89 mg/dL (ref 70–99)
Potassium: 3.4 mmol/L — ABNORMAL LOW (ref 3.5–5.1)
Sodium: 140 mmol/L (ref 135–145)
Total Bilirubin: 0.4 mg/dL (ref 0.0–1.2)
Total Protein: 7.6 g/dL (ref 6.5–8.1)

## 2023-05-12 LAB — CBC WITH DIFFERENTIAL/PLATELET
Abs Immature Granulocytes: 0.02 10*3/uL (ref 0.00–0.07)
Basophils Absolute: 0 10*3/uL (ref 0.0–0.1)
Basophils Relative: 0 %
Eosinophils Absolute: 0.1 10*3/uL (ref 0.0–0.5)
Eosinophils Relative: 1 %
HCT: 39 % (ref 36.0–46.0)
Hemoglobin: 13.1 g/dL (ref 12.0–15.0)
Immature Granulocytes: 0 %
Lymphocytes Relative: 27 %
Lymphs Abs: 1.9 10*3/uL (ref 0.7–4.0)
MCH: 29.6 pg (ref 26.0–34.0)
MCHC: 33.6 g/dL (ref 30.0–36.0)
MCV: 88 fL (ref 80.0–100.0)
Monocytes Absolute: 0.6 10*3/uL (ref 0.1–1.0)
Monocytes Relative: 8 %
Neutro Abs: 4.4 10*3/uL (ref 1.7–7.7)
Neutrophils Relative %: 64 %
Platelets: 362 10*3/uL (ref 150–400)
RBC: 4.43 MIL/uL (ref 3.87–5.11)
RDW: 12.7 % (ref 11.5–15.5)
WBC: 6.9 10*3/uL (ref 4.0–10.5)
nRBC: 0 % (ref 0.0–0.2)

## 2023-05-12 LAB — URINALYSIS, ROUTINE W REFLEX MICROSCOPIC
Bilirubin Urine: NEGATIVE
Glucose, UA: NEGATIVE mg/dL
Leukocytes,Ua: NEGATIVE
Nitrite: NEGATIVE
Protein, ur: NEGATIVE mg/dL
RBC / HPF: >50 RBC/hpf (ref 0–5)
Specific Gravity, Urine: 1.005 (ref 1.005–1.030)
pH: 7.5 (ref 5.0–8.0)

## 2023-05-12 LAB — PREGNANCY, URINE: Preg Test, Ur: NEGATIVE

## 2023-05-12 MED ORDER — ACETAMINOPHEN 500 MG PO TABS
1000.0000 mg | ORAL_TABLET | Freq: Four times a day (QID) | ORAL | Status: DC | PRN
  Filled 2023-05-12: qty 2

## 2023-05-12 MED ORDER — KETOROLAC TROMETHAMINE 15 MG/ML IJ SOLN
15.0000 mg | Freq: Once | INTRAMUSCULAR | Status: AC
  Administered 2023-05-12: 15 mg via INTRAVENOUS
  Filled 2023-05-12: qty 1

## 2023-05-12 MED ORDER — SODIUM CHLORIDE 0.9 % IV BOLUS
1000.0000 mL | Freq: Once | INTRAVENOUS | Status: AC
  Administered 2023-05-12: 1000 mL via INTRAVENOUS

## 2023-05-12 MED ORDER — CEPHALEXIN 500 MG PO CAPS: 500.0000 mg | ORAL_CAPSULE | Freq: Four times a day (QID) | ORAL | 0 refills | Status: DC

## 2023-05-12 MED ORDER — POTASSIUM CHLORIDE CRYS ER 20 MEQ PO TBCR
40.0000 meq | EXTENDED_RELEASE_TABLET | Freq: Once | ORAL | Status: AC
  Administered 2023-05-12: 40 meq via ORAL
  Filled 2023-05-12: qty 2

## 2023-05-12 MED ORDER — LORAZEPAM 2 MG/ML IJ SOLN
1.0000 mg | Freq: Once | INTRAMUSCULAR | Status: AC
  Administered 2023-05-12: 1 mg via INTRAVENOUS
  Filled 2023-05-12: qty 1

## 2023-05-12 NOTE — Telephone Encounter (Signed)
 Please see patient concerns below and advise.

## 2023-05-12 NOTE — Telephone Encounter (Addendum)
 Patient noted to have went to ER yesterday after calling office.

## 2023-05-12 NOTE — ED Provider Notes (Addendum)
 [place] EMERGENCY DEPARTMENT AT [facility] Provider Note   CSN: [id number] Arrival date & time: 05/12/23  4098     History  Chief Complaint  Patient presents with   Hematuria    Samantha Nguyen is a 38 y.o. female patient with past medical history of major depressive disorder, PTSD, severe anxiety with panic, history of kidney stones is reporting to the emergency room with complaint of hematuria.  Patient reports she has been seen here and diagnosed with kidney stones.  She has procedure with urology scheduled on Monday for her kidney stones.  Patient reports she returns today because she was concerned she had urinary tract infection.  She reports she has urinary frequency and urgency.  Patient reports her pain is well-controlled with at home pain medications.  She does not need anything for pain or nausea at this time.  Denies any significant change in abdominal pain, flank pain.  She does not have any chest pain shortness of breath fevers or chills.   Hematuria       Home Medications Prior to Admission medications   Medication Sig Start Date End Date Taking? Authorizing Provider  Cholecalciferol (VITAMIN D3) 1.25 MG (50000 UT) CAPS Take 1 capsule (1.25 mg total) by mouth once a week. AFTER the last weekly pill, start a daily OTC Vitamin D3 up to 4,000units. Patient taking differently: Take 1 capsule by mouth every Saturday. 03/25/23   Dulce Sellar, NP  cyanocobalamin (VITAMIN B12) 1000 MCG tablet Take 1,000 mcg by mouth daily.    [provider]  escitalopram (LEXAPRO) 10 MG tablet Take 15 mg by mouth daily. 03/21/23   [provider]  LORazepam (ATIVAN) 1 MG tablet Take 1 tablet (1 mg total) by mouth daily as needed for anxiety (panic attacks). Patient taking differently: Take 1 mg by mouth 3 (three) times daily as needed for anxiety (panic attacks). 04/14/23   Gareth Eagle, PA-C  mirtazapine (REMERON) 7.5 MG tablet Take 3.75 mg by mouth at  bedtime as needed (sleep). 05/03/23   [provider]  oxyCODONE-acetaminophen (PERCOCET/ROXICET) 5-325 MG tablet Take 1 tablet by mouth every 6 (six) hours as needed for severe pain (pain score 7-10). 05/08/23   Gwyneth Sprout, MD  tamsulosin (FLOMAX) 0.4 MG CAPS capsule Take 1 capsule by mouth daily. 05/07/23   [provider]      Allergies    Sulfa antibiotics    Review of Systems   Review of Systems  Genitourinary:  Positive for hematuria.    Physical Exam Updated Vital Signs BP 136/88 (BP Location: Left Arm)   Pulse 100   Temp 98.2 F (36.8 C) (Oral)   Resp 20   Ht 5\' 7"  (1.702 m)   Wt 64.4 kg   LMP 05/04/2023 (Approximate)   SpO2 100%   BMI 22.24 kg/m  Physical Exam Vitals and nursing note reviewed.  Constitutional:      General: She is not in acute distress.    Appearance: She is not toxic-appearing.     Comments: Patient is anxious is room, but well appearing.   HENT:     Head: Normocephalic and atraumatic.  Eyes:     General: No scleral icterus.    Conjunctiva/sclera: Conjunctivae normal.  Cardiovascular:     Rate and Rhythm: Normal rate and regular rhythm.     Pulses: Normal pulses.     Heart sounds: Normal heart sounds.  Pulmonary:     Effort: Pulmonary effort is normal. No respiratory distress.  Breath sounds: Normal breath sounds.  Abdominal:     General: Abdomen is flat. Bowel sounds are normal.     Palpations: Abdomen is soft.     Tenderness: There is no abdominal tenderness.  Musculoskeletal:     Right lower leg: No edema.     Left lower leg: No edema.  Skin:    General: Skin is warm and dry.     Findings: No lesion.  Neurological:     General: No focal deficit present.     Mental Status: She is alert and oriented to person, place, and time. Mental status is at baseline.     ED Results / Procedures / Treatments   Labs (all labs ordered are listed, but only abnormal results are displayed) Labs Reviewed  CBC WITH  DIFFERENTIAL/PLATELET  COMPREHENSIVE METABOLIC PANEL  URINALYSIS, ROUTINE W REFLEX MICROSCOPIC  PREGNANCY, URINE    EKG None  Radiology DG Abdomen 1 View Result Date: 05/11/2023 CLINICAL DATA:  Bilateral flank pain. History of nephrolithiasis. Lower pelvic pain. EXAM: ABDOMEN - 1 VIEW COMPARISON:  08/23/2016.  Abdomen and pelvis CT dated 04/07/2023. FINDINGS: Normal bowel-gas pattern. Stable bilateral pelvic phleboliths. Faintly visualized left mid and lower left renal calculi. The previously demonstrated larger upper pole left renal calculus is either obscured by overlying colon or has moved into the region of the left ureteropelvic junction. This measures 7 mm. The previously demonstrated small right renal calculi are not visible with overlying colon. Mild lumbar spine degenerative changes and mild scoliosis. IMPRESSION: 1. Faintly visualized left mid and lower left renal calculi. 2. The previously demonstrated larger, 7 mm upper pole left renal calculus is either obscured by overlying colon or has moved into the region of the left ureteropelvic junction. 3. The previously demonstrated small right renal calculi are not visible, most likely due to overlying colon. Electronically Signed   By: Beckie Salts M.D.   On: 05/11/2023 15:34   US Renal Result Date: 05/11/2023 CLINICAL DATA:  Bilateral flank pain.  History renal stones. EXAM: RENAL / URINARY TRACT ULTRASOUND COMPLETE COMPARISON:  CT abdomen/pelvis dated 04/07/2023. FINDINGS: Right Kidney: Renal measurements: 10.2 x 3.9 x 5.9 cm = volume: 122.4 mL. Echogenicity within normal limits. Multiple echogenic foci with shadowing, compatible with stones, measuring up to 8 mm. No hydronephrosis visualized. Left Kidney: Renal measurements: 12 x 4.6 x 4.7 cm = volume: 137 mL. Echogenicity within normal limits. Multiple echogenic foci with shadowing, compatible with stones, measuring up to 7 mm. No hydronephrosis visualized. Bladder: Appears normal for degree  of bladder distention. Other: None. IMPRESSION: Bilateral nonobstructive nephrolithiasis. No hydronephrosis visualized. Electronically Signed   By: Hart Robinsons M.D.   On: 05/11/2023 13:35    Procedures Procedures    Medications Ordered in ED Medications  acetaminophen (TYLENOL) tablet 1,000 mg (has no administration in time range)  ketorolac (TORADOL) 15 MG/ML injection 15 mg (has no administration in time range)  sodium chloride 0.9 % bolus 1,000 mL (0 mLs Intravenous Stopped 05/12/23 1155)  potassium chloride SA (KLOR-CON M) CR tablet 40 mEq (40 mEq Oral Given 05/12/23 1157)    ED Course/ Medical Decision Making/ A&P                                 Medical Decision Making Amount and/or Complexity of Data Reviewed Labs: ordered.  Risk OTC drugs. Prescription drug management.   Siddhi Dornbush 38 y.o. presented today for abd  pain. Working DDx includes, but not limited to, gastroenteritis, colitis, SBO, appendicitis, cholecystitis, hepatobiliary pathology, gastritis, PUD, ACS, dissection, pancreatitis, nephrolithiasis, AAA, UTI, pyelonephritis, torsion.   R/o DDx: These are considered less likely than current impression due to history of present illness, physical exam, labs/imaging findings.  Review of prior external notes: Reviewed note yesterday 05/11/2023 for similar complaint in emergency room  Pmhx: Kidney stones, depressive disorder, panic disorder  Unique Tests and My Interpretation:  CBC without leukocytosis and no anemia CMP: Potassium 3.4, oral supplement here. No bump in creatinine.  UA: Urine is colorless with large amount of hemoglobin.  She is negative for nitrates and leukocytes.  She does have rare bacteria.  0-5 squamous cells present, since kidney stone will send for culture. Preg: Negative   Imaging: Did not repeat imaging as patient just had x-ray and renal ultrasound done last night.  This was less than 24 hours ago when she has not had any change in  symptoms, but noted gross blood in urine which has now resolved.    Problem List / ED Course / Critical interventions / Medication management  Patient has kidney stones.  She was presenting today with complaint of hematuria.  Patient reports her pain has been controlled with medications at home.  She is tolerating oral intake without any nausea or vomiting.  She reports she returned because she was concerned she might need an antibiotic given urinary frequency.  On exam she is anxious but well-appearing.  She does not have fever.  She is tachycardic however I feel this is likely secondary to feeling quite anxious about ED visit.  Patient expresses she is nervous about having procedure for her kidney stones.  Her lab work is reassuring with no elevated white blood cell count.  No obvious source of infection, but does have rare bacteria in urine - negative for nitrites and leukocytes. Given, symptoms and stone will send for culture and prescribe empiric antibiotics.  She does not appear systemically ill and given reassuring workup do not feel repeat imaging is needed at this time as she had x-ray to evaluate kidney stone less than 24 hours ago.  She has not had any significant change in symptoms.  She is stable for discharge. I ordered medication including NS. Offered pain and nausea medications - patient declined.  On re-eval patient requesting non-narcotic pain management. She did well with Toradol yesterday and had not taken anything today. Will give Toradol.  Patient also reporting she would like something for anxiety.  She does take Ativan as needed at home for anxiety.  I will give home dose of Ativan. Reevaluation of the patient after these medicines showed that the patient improved Patients vitals assessed. Upon arrival patient is hemodynamically stable.  I have reviewed the patients home medicines and have made adjustments as needed  Plan:  F/u w/ PCP in 2-3d to ensure resolution of sx.   Patient was given return precautions. Patient stable for discharge at this time.  Patient educated on current sx/dx and verbalized understanding of plan. Return to ER w/ new or worsening sx.          Final Clinical Impression(s) / ED Diagnoses Final diagnoses:  Other microscopic hematuria  Nephrolithiasis    Rx / DC Orders ED Discharge Orders          Ordered    cephALEXin (KEFLEX) 500 MG capsule  4 times daily        05/12/23 1303  Smitty Knudsen, PA-C 05/12/23 1308    Smitty Knudsen, PA-C 05/12/23 1338    Arby Barrette, MD 05/16/23 1504

## 2023-05-12 NOTE — ED Triage Notes (Signed)
 Pt caox4 c/o bilateral flank pain, scheduled for surgery Monday for kidney stones, pt was eval/tx in ED yest for same but reports she noticed hematuria last night with clots and has had chills today.

## 2023-05-12 NOTE — Discharge Instructions (Addendum)
 I have sent your urine for culture as discussed.  I have sent Keflex to your pharmacy as discussed.  Please continue with medications to control pain and nausea.  If you have difficulty controlling your pain with your prescribed medications please return.  If you start developing fever please return.  Please follow-up for scheduled procedure.

## 2023-05-13 ENCOUNTER — Telehealth: Payer: Self-pay

## 2023-05-13 LAB — URINE CULTURE: Culture: NO GROWTH

## 2023-05-13 NOTE — Telephone Encounter (Signed)
-----   Message from Samantha Nguyen sent at 05/13/2023  8:50 AM EST ----- Please notify patient: Negative urine culture, no antibiotic needed at this time. Thanks.

## 2023-05-13 NOTE — Telephone Encounter (Signed)
Patient is made aware and verbalized understanding.

## 2023-05-16 ENCOUNTER — Encounter (HOSPITAL_COMMUNITY): Payer: Self-pay | Admitting: Urology

## 2023-05-16 ENCOUNTER — Encounter (HOSPITAL_COMMUNITY)
Admission: RE | Disposition: A | Discharge status: Home / Self Care | Payer: Self-pay | Source: Ambulatory Visit | Attending: Urology

## 2023-05-16 ENCOUNTER — Ambulatory Visit (HOSPITAL_COMMUNITY): Payer: MEDICAID | Admitting: Anesthesiology

## 2023-05-16 ENCOUNTER — Ambulatory Visit (HOSPITAL_COMMUNITY)
Admission: RE | Admit: 2023-05-16 | Discharge: 2023-05-16 | Disposition: A | Discharge status: Home / Self Care | Payer: MEDICAID | Source: Ambulatory Visit | Attending: Urology | Admitting: Urology

## 2023-05-16 ENCOUNTER — Ambulatory Visit (HOSPITAL_BASED_OUTPATIENT_CLINIC_OR_DEPARTMENT_OTHER): Payer: MEDICAID | Admitting: Anesthesiology

## 2023-05-16 ENCOUNTER — Ambulatory Visit (HOSPITAL_COMMUNITY): Payer: MEDICAID

## 2023-05-16 ENCOUNTER — Encounter: Payer: Self-pay | Admitting: Urology

## 2023-05-16 DIAGNOSIS — I10 Essential (primary) hypertension: Secondary | ICD-10-CM | POA: Insufficient documentation

## 2023-05-16 DIAGNOSIS — N200 Calculus of kidney: Secondary | ICD-10-CM | POA: Diagnosis not present

## 2023-05-16 DIAGNOSIS — Z87891 Personal history of nicotine dependence: Secondary | ICD-10-CM | POA: Insufficient documentation

## 2023-05-16 DIAGNOSIS — F32A Depression, unspecified: Secondary | ICD-10-CM | POA: Insufficient documentation

## 2023-05-16 DIAGNOSIS — N201 Calculus of ureter: Secondary | ICD-10-CM | POA: Insufficient documentation

## 2023-05-16 DIAGNOSIS — R519 Headache, unspecified: Secondary | ICD-10-CM | POA: Diagnosis not present

## 2023-05-16 DIAGNOSIS — F418 Other specified anxiety disorders: Secondary | ICD-10-CM | POA: Diagnosis not present

## 2023-05-16 DIAGNOSIS — F419 Anxiety disorder, unspecified: Secondary | ICD-10-CM | POA: Diagnosis not present

## 2023-05-16 DIAGNOSIS — Z01818 Encounter for other preprocedural examination: Secondary | ICD-10-CM

## 2023-05-16 HISTORY — PX: CYSTOSCOPY WITH RETROGRADE PYELOGRAM, URETEROSCOPY AND STENT PLACEMENT: SHX5789

## 2023-05-16 HISTORY — PX: HOLMIUM LASER APPLICATION: SHX5852

## 2023-05-16 LAB — UNMAPPED LAB RESULTS
ABO RH Blood Type (HT): A POS
Antibody Screen (HT): NEGATIVE
Basophil # (HT): 0.1 10 3/uL (ref 0.0–0.2)
Basophil % (HT): 0 % (ref 0–3)
Eosinophil # (HT): 0.1 10 3/uL (ref 0.0–0.6)
Eosinophil % (HT): 1 % (ref 0–5)
Hematocrit (HT): 25 % — ABNORMAL LOW (ref 35–47)
Hemoglobin (HGB) (HT): 6.6 g/dL — ABNORMAL LOW (ref 12.0–16.0)
Lymphocyte # (HT): 2.4 10 3/uL (ref 1.0–4.8)
Lymphocyte % (HT): 16 % (ref 15–45)
MCHC (HT): 26.1 g/dL — ABNORMAL LOW (ref 31.0–37.5)
MCV (HT): 75 fL — ABNORMAL LOW (ref 80–100)
Mean Corpuscular Hemoglobin (MCH) (HT): 19.6 pg — ABNORMAL LOW (ref 26.0–34.0)
Monocyte # (HT): 1.2 10 3/uL — ABNORMAL HIGH (ref 0.1–1.0)
Monocyte % (HT): 8 % (ref 0–15)
Neutrophil # (HT): 11 10 3/uL — ABNORMAL HIGH (ref 1.8–8.0)
Platelets (HT): 368 10 3/uL (ref 150–450)
RBC (HT): 3.37 10 6/uL — ABNORMAL LOW (ref 3.80–5.20)
RDW (HT): 19.4 % — ABNORMAL HIGH (ref 0.0–15.2)
Seg Neut % (HT): 74 % (ref 45–75)
WBC (HT): 14.9 10 3/uL — ABNORMAL HIGH (ref 4.0–11.0)

## 2023-05-16 SURGERY — CYSTOURETEROSCOPY, WITH RETROGRADE PYELOGRAM AND STENT INSERTION
Anesthesia: General | Site: Ureter | Laterality: Left

## 2023-05-16 MED ORDER — DIATRIZOATE MEGLUMINE 30 % UR SOLN
URETHRAL | Status: DC | PRN
  Administered 2023-05-16: 4 mL via URETHRAL

## 2023-05-16 MED ORDER — GLYCOPYRROLATE PF 0.2 MG/ML IJ SOSY
PREFILLED_SYRINGE | INTRAMUSCULAR | Status: DC | PRN
  Administered 2023-05-16 (×2): .1 mg via INTRAVENOUS

## 2023-05-16 MED ORDER — GLYCOPYRROLATE PF 0.2 MG/ML IJ SOSY
PREFILLED_SYRINGE | INTRAMUSCULAR | Status: AC
  Filled 2023-05-16: qty 1

## 2023-05-16 MED ORDER — PROPOFOL 10 MG/ML IV BOLUS
INTRAVENOUS | Status: AC
  Filled 2023-05-16: qty 20

## 2023-05-16 MED ORDER — KETOROLAC TROMETHAMINE 30 MG/ML IJ SOLN
INTRAMUSCULAR | Status: AC
Start: 2023-05-16 — End: ?
  Filled 2023-05-16: qty 1

## 2023-05-16 MED ORDER — FENTANYL CITRATE (PF) 100 MCG/2ML IJ SOLN
INTRAMUSCULAR | Status: DC | PRN
  Administered 2023-05-16: 50 ug via INTRAVENOUS

## 2023-05-16 MED ORDER — SODIUM CHLORIDE 0.9 % IR SOLN
Status: DC | PRN
  Administered 2023-05-16: 3000 mL

## 2023-05-16 MED ORDER — CHLORHEXIDINE GLUCONATE 0.12 % MT SOLN
15.0000 mL | Freq: Once | OROMUCOSAL | Status: AC
  Administered 2023-05-16: 15 mL via OROMUCOSAL
  Filled 2023-05-16: qty 15

## 2023-05-16 MED ORDER — LACTATED RINGERS IV SOLN: INTRAVENOUS | Status: DC

## 2023-05-16 MED ORDER — ONDANSETRON HCL 4 MG/2ML IJ SOLN: 4.0000 mg | Freq: Once | INTRAMUSCULAR | Status: DC | PRN

## 2023-05-16 MED ORDER — ONDANSETRON HCL 4 MG/2ML IJ SOLN
INTRAMUSCULAR | Status: AC
  Filled 2023-05-16: qty 2

## 2023-05-16 MED ORDER — FENTANYL CITRATE PF 50 MCG/ML IJ SOSY
25.0000 ug | PREFILLED_SYRINGE | INTRAMUSCULAR | Status: DC | PRN
Start: 2023-05-16 — End: 2023-05-16

## 2023-05-16 MED ORDER — SEVOFLURANE IN SOLN
RESPIRATORY_TRACT | Status: AC
  Filled 2023-05-16: qty 250

## 2023-05-16 MED ORDER — OXYCODONE HCL 5 MG PO TABS: 5.0000 mg | ORAL_TABLET | Freq: Once | ORAL | Status: DC | PRN

## 2023-05-16 MED ORDER — LIDOCAINE HCL (PF) 2 % IJ SOLN
INTRAMUSCULAR | Status: DC | PRN
  Administered 2023-05-16: 100 mg via INTRADERMAL

## 2023-05-16 MED ORDER — WATER FOR IRRIGATION, STERILE IR SOLN
Status: DC | PRN
  Administered 2023-05-16: 500 mL

## 2023-05-16 MED ORDER — LIDOCAINE HCL (PF) 2 % IJ SOLN
INTRAMUSCULAR | Status: AC
  Filled 2023-05-16: qty 5

## 2023-05-16 MED ORDER — ONDANSETRON HCL 4 MG/2ML IJ SOLN
INTRAMUSCULAR | Status: DC | PRN
  Administered 2023-05-16: 4 mg via INTRAVENOUS

## 2023-05-16 MED ORDER — DIATRIZOATE MEGLUMINE 30 % UR SOLN
URETHRAL | Status: AC
  Filled 2023-05-16: qty 100

## 2023-05-16 MED ORDER — MIDAZOLAM HCL 2 MG/2ML IJ SOLN
1.0000 mg | INTRAMUSCULAR | Status: DC | PRN
  Administered 2023-05-16: 1 mg via INTRAVENOUS

## 2023-05-16 MED ORDER — OXYCODONE-ACETAMINOPHEN 5-325 MG PO TABS: 1.0000 | ORAL_TABLET | Freq: Four times a day (QID) | ORAL | 0 refills | Status: DC | PRN

## 2023-05-16 MED ORDER — CEFAZOLIN SODIUM-DEXTROSE 2-4 GM/100ML-% IV SOLN
2.0000 g | INTRAVENOUS | Status: AC
  Administered 2023-05-16: 2 g via INTRAVENOUS
  Filled 2023-05-16: qty 100

## 2023-05-16 MED ORDER — MIDAZOLAM HCL 2 MG/2ML IJ SOLN
INTRAMUSCULAR | Status: AC
  Filled 2023-05-16: qty 2

## 2023-05-16 MED ORDER — OXYCODONE HCL 5 MG/5ML PO SOLN: 5.0000 mg | Freq: Once | ORAL | Status: DC | PRN

## 2023-05-16 MED ORDER — PROPOFOL 10 MG/ML IV BOLUS
INTRAVENOUS | Status: DC | PRN
  Administered 2023-05-16: 200 mg via INTRAVENOUS
  Administered 2023-05-16: 50 mg via INTRAVENOUS

## 2023-05-16 MED ORDER — MIDAZOLAM HCL 2 MG/2ML IJ SOLN
INTRAMUSCULAR | Status: DC | PRN
  Administered 2023-05-16: 2 mg via INTRAVENOUS

## 2023-05-16 MED ORDER — MIDAZOLAM HCL 2 MG/2ML IJ SOLN
INTRAMUSCULAR | Status: AC
  Administered 2023-05-16: 1 mg via INTRAVENOUS
  Filled 2023-05-16: qty 2

## 2023-05-16 MED ORDER — DEXMEDETOMIDINE HCL IN NACL 80 MCG/20ML IV SOLN
INTRAVENOUS | Status: DC | PRN
  Administered 2023-05-16: 8 ug via INTRAVENOUS

## 2023-05-16 MED ORDER — PHENYLEPHRINE 80 MCG/ML (10ML) SYRINGE FOR IV PUSH (FOR BLOOD PRESSURE SUPPORT)
PREFILLED_SYRINGE | INTRAVENOUS | Status: AC
  Filled 2023-05-16: qty 30

## 2023-05-16 MED ORDER — FENTANYL CITRATE (PF) 100 MCG/2ML IJ SOLN
INTRAMUSCULAR | Status: AC
  Filled 2023-05-16: qty 2

## 2023-05-16 MED ORDER — ORAL CARE MOUTH RINSE: 15.0000 mL | Freq: Once | OROMUCOSAL | Status: AC

## 2023-05-16 MED ORDER — DEXAMETHASONE SODIUM PHOSPHATE 10 MG/ML IJ SOLN
INTRAMUSCULAR | Status: DC | PRN
Start: 2023-05-16 — End: 2023-05-16
  Administered 2023-05-16: 5 mg via INTRAVENOUS

## 2023-05-16 MED ORDER — KETOROLAC TROMETHAMINE 30 MG/ML IJ SOLN
INTRAMUSCULAR | Status: DC | PRN
  Administered 2023-05-16: 30 mg via INTRAVENOUS

## 2023-05-16 SURGICAL SUPPLY — 26 items
BAG DRAIN URO TABLE W/ADPT NS (BAG) ×1 IMPLANT
BAG HAMPER (MISCELLANEOUS) ×1 IMPLANT
CATH INTERMIT  6FR 70CM (CATHETERS) ×1 IMPLANT
CLOTH BEACON ORANGE TIMEOUT ST (SAFETY) ×1 IMPLANT
DECANTER SPIKE VIAL GLASS SM (MISCELLANEOUS) ×1 IMPLANT
EXTRACTOR STONE NITINOL NGAGE (UROLOGICAL SUPPLIES) IMPLANT
GLOVE BIO SURGEON STRL SZ8 (GLOVE) ×1 IMPLANT
GLOVE BIOGEL PI IND STRL 7.0 (GLOVE) ×2 IMPLANT
GOWN STRL REUS W/TWL LRG LVL3 (GOWN DISPOSABLE) ×1 IMPLANT
GOWN STRL REUS W/TWL XL LVL3 (GOWN DISPOSABLE) ×1 IMPLANT
GUIDEWIRE STR DUAL SENSOR (WIRE) ×1 IMPLANT
GUIDEWIRE STR ZIPWIRE 035X150 (MISCELLANEOUS) ×1 IMPLANT
KIT TURNOVER CYSTO (KITS) ×1 IMPLANT
MANIFOLD NEPTUNE II (INSTRUMENTS) ×1 IMPLANT
PACK CYSTO (CUSTOM PROCEDURE TRAY) ×1 IMPLANT
PAD ARMBOARD 7.5X6 YLW CONV (MISCELLANEOUS) ×1 IMPLANT
POSITIONER HEAD 8X9X4 ADT (SOFTGOODS) ×1 IMPLANT
SHEATH NAVIGATOR HD 11/13X36 (SHEATH) IMPLANT
SHEATH URETERAL 12FRX35CM (MISCELLANEOUS) IMPLANT
SOL .9 NS 3000ML IRR UROMATIC (IV SOLUTION) ×2 IMPLANT
STENT URET 6FRX26 CONTOUR (STENTS) IMPLANT
SYR 10ML LL (SYRINGE) ×1 IMPLANT
SYR CONTROL 10ML LL (SYRINGE) ×1 IMPLANT
TOWEL OR 17X26 4PK STRL BLUE (TOWEL DISPOSABLE) ×1 IMPLANT
TRACTIP FLEXIVA PULS ID 200XHI (Laser) IMPLANT
WATER STERILE IRR 500ML POUR (IV SOLUTION) ×1 IMPLANT

## 2023-05-16 NOTE — Transfer of Care (Signed)
 Immediate Anesthesia Transfer of Care Note  Patient: Samantha Nguyen  Procedure(s) Performed: CYSTOSCOPY WITH RETROGRADE PYELOGRAM, URETEROSCOPY AND STENT PLACEMENT (Left: Ureter) HOLMIUM LASER APPLICATION (Left: Ureter)  Patient Location: PACU  Anesthesia Type:General  Level of Consciousness: drowsy and patient cooperative  Airway & Oxygen Therapy: Patient Spontanous Breathing and Patient connected to face mask oxygen  Post-op Assessment: Report given to RN, Post -op Vital signs reviewed and stable, and Patient moving all extremities X 4  Post vital signs: Reviewed and stable  Last Vitals:  Vitals Value Taken Time  BP 92/62 05/16/23 1514  Temp 36.6 C 05/16/23 1512  Pulse 69 05/16/23 1513  Resp 21 05/16/23 1513  SpO2 95 % 05/16/23 1513  Vitals shown include unfiled device data.  Last Pain:  Vitals:   05/16/23 1328  PainSc: 0-No pain         Complications: No notable events documented.

## 2023-05-16 NOTE — Anesthesia Procedure Notes (Signed)
 Procedure Name: LMA Insertion Date/Time: 05/16/2023 2:27 PM  Performed by: [name] Reil, CRNAPre-anesthesia Checklist: Patient identified, Emergency Drugs available, Suction available and Patient being monitored Patient Re-evaluated:Patient Re-evaluated prior to induction Oxygen Delivery Method: Circle system utilized Preoxygenation: Pre-oxygenation with 100% oxygen Induction Type: IV induction LMA: LMA inserted LMA Size: 4.0 Tube type: Oral Number of attempts: 1 Placement Confirmation: positive ETCO2 Tube secured with: Tape Dental Injury: Teeth and Oropharynx as per pre-operative assessment

## 2023-05-16 NOTE — Op Note (Signed)
.  Preoperative diagnosis: Left ureteral stone  Postoperative diagnosis: Same  Procedure: 1 cystoscopy 2. Left retrograde pyelography 3.  Intraoperative fluoroscopy, under one hour, with interpretation 4.  Left ureteroscopic stone manipulation with laser lithotripsy 5.  Left 6 x 26 JJ stent placement  Attending: Cleda Mccreedy  Anesthesia: General  Estimated blood loss: None  Drains: Left 6 x 26 JJ ureteral stent without tether  Specimens: stone for analysis  Antibiotics: ***  Findings: left lower pole stone. No hydronephrosis. No masses/lesions in the bladder. Ureteral orifices in normal anatomic location.  Indications: Patient is a ***-year-old female/female with a history of left renal stone and who has persistent left flank pain.  After discussing treatment options, she decided proceed with left ureteroscopic stone manipulation.  Procedure her in detail: The patient was brought to the operating room and a brief timeout was done to ensure correct patient, correct procedure, correct site.  General anesthesia was administered patient was placed in dorsal lithotomy position.  Her genitalia was then prepped and draped in usual sterile fashion.  A rigid 22 French cystoscope was passed in the urethra and the bladder.  Bladder was inspected free masses or lesions.  the ureteral orifices were in the normal orthotopic locations.  a 6 french ureteral catheter was then instilled into the left ureteral orifice.  a gentle retrograde was obtained and findings noted above.  we then placed a zip wire through the ureteral catheter and advanced up to the renal pelvis.  we then removed the cystoscope and cannulated the left ureteral orifice with a semirigid ureteroscope.  No stone was found in the ureter. Once we reached the UPJ a sensor wire was advanced in to the renal pelvis. We then removed the ureteroscope and advanced am 12/14 x 38cm access sheath up to the renal pelvis. We then used the flexible  ureteroscope to perform nephroscopy. We encountered the stone in the lower pole.    the pieces were then removed with a Ngage basket.    once all stone fragments were removed we then removed the access sheath under direct vision and noted no injury to the ureter. We then placed a 6 x 26 double-j ureteral stent over the original zip wire.  We then removed the wire and good coil was noted in the the renal pelvis under fluoroscopy and the bladder under direct vision. the bladder was then drained and this concluded the procedure which was well tolerated by patient.  Complications: None  Condition: Stable, extubated, transferred to PACU  Plan: Patient is to be discharged home as to follow-up in one week for stent removal.

## 2023-05-16 NOTE — Interval H&P Note (Signed)
 History and Physical Interval Note:  05/16/2023 2:04 PM  Samantha Nguyen  has presented today for surgery, with the diagnosis of bilateral nephrolithiasis.  The various methods of treatment have been discussed with the patient and family. After consideration of risks, benefits and other options for treatment, the patient has consented to  Procedure(s): CYSTOSCOPY WITH RETROGRADE PYELOGRAM, URETEROSCOPY AND STENT PLACEMENT (Bilateral) HOLMIUM LASER APPLICATION (Bilateral) as a surgical intervention.  The patient's history has been reviewed, patient examined, no change in status, stable for surgery.  I have reviewed the patient's chart and labs.  Questions were answered to the patient's satisfaction.     Wilkie Aye

## 2023-05-16 NOTE — Discharge Instructions (Signed)
 Patient is to be discharged home as to follow-up in one week for stent removal.

## 2023-05-16 NOTE — Anesthesia Preprocedure Evaluation (Signed)
 Anesthesia Evaluation  Patient identified by MRN, date of birth, ID band Patient awake    Reviewed: Allergy & Precautions, H&P , NPO status , Patient's Chart, lab work & pertinent test results, reviewed documented beta blocker date and time   Airway Mallampati: II  TM Distance: >3 FB Neck ROM: full    Dental no notable dental hx.    Pulmonary neg pulmonary ROS, former smoker   Pulmonary exam normal breath sounds clear to auscultation       Cardiovascular Exercise Tolerance: Good hypertension, negative cardio ROS  Rhythm:regular Rate:Normal     Neuro/Psych  Headaches PSYCHIATRIC DISORDERS Anxiety Depression    negative neurological ROS  negative psych ROS   GI/Hepatic negative GI ROS, Neg liver ROS,,,  Endo/Other  negative endocrine ROS    Renal/GU Renal diseasenegative Renal ROS  negative genitourinary   Musculoskeletal   Abdominal   Peds  Hematology negative hematology ROS (+)   Anesthesia Other Findings Severe Anxiety, PTSD  Reproductive/Obstetrics negative OB ROS                             Anesthesia Physical Anesthesia Plan  ASA: 3  Anesthesia Plan: General and General LMA   Post-op Pain Management:    Induction:   PONV Risk Score and Plan: Ondansetron  Airway Management Planned:   Additional Equipment:   Intra-op Plan:   Post-operative Plan:   Informed Consent: I have reviewed the patients History and Physical, chart, labs and discussed the procedure including the risks, benefits and alternatives for the proposed anesthesia with the patient or authorized representative who has indicated his/her understanding and acceptance.     Dental Advisory Given  Plan Discussed with: CRNA  Anesthesia Plan Comments:        Anesthesia Quick Evaluation

## 2023-05-17 ENCOUNTER — Telehealth: Payer: Self-pay | Admitting: Urology

## 2023-05-17 ENCOUNTER — Emergency Department (HOSPITAL_COMMUNITY)
Admission: EM | Admit: 2023-05-17 | Discharge: 2023-05-17 | Disposition: A | Discharge status: Home / Self Care | Payer: MEDICAID | Attending: Emergency Medicine | Admitting: Emergency Medicine

## 2023-05-17 ENCOUNTER — Encounter (HOSPITAL_COMMUNITY): Payer: Self-pay | Admitting: Urology

## 2023-05-17 ENCOUNTER — Other Ambulatory Visit: Payer: Self-pay

## 2023-05-17 DIAGNOSIS — R10A0 Flank pain, unspecified side: Secondary | ICD-10-CM

## 2023-05-17 DIAGNOSIS — F419 Anxiety disorder, unspecified: Secondary | ICD-10-CM | POA: Diagnosis not present

## 2023-05-17 DIAGNOSIS — N390 Urinary tract infection, site not specified: Secondary | ICD-10-CM

## 2023-05-17 DIAGNOSIS — R109 Unspecified abdominal pain: Secondary | ICD-10-CM | POA: Insufficient documentation

## 2023-05-17 LAB — UNMAPPED LAB RESULTS
Hematocrit (HT): 28 % — ABNORMAL LOW (ref 35–47)
Hemoglobin (HGB) (HT): 7.5 g/dL — ABNORMAL LOW (ref 12.0–16.0)
MCHC (HT): 26.4 g/dL — ABNORMAL LOW (ref 31.0–37.5)
MCV (HT): 77 fL — ABNORMAL LOW (ref 80–100)
Mean Corpuscular Hemoglobin (MCH) (HT): 20.2 pg — ABNORMAL LOW (ref 26.0–34.0)
Platelets (HT): 381 10 3/uL (ref 150–450)
RBC (HT): 3.71 10 6/uL — ABNORMAL LOW (ref 3.80–5.20)
RDW (HT): 18.8 % — ABNORMAL HIGH (ref 0.0–15.2)
WBC (HT): 20.6 10 3/uL — ABNORMAL HIGH (ref 4.0–11.0)

## 2023-05-17 LAB — URINALYSIS, ROUTINE W REFLEX MICROSCOPIC
Bilirubin Urine: NEGATIVE
Glucose, UA: NEGATIVE mg/dL
Ketones, ur: NEGATIVE mg/dL
Nitrite: NEGATIVE
Protein, ur: 100 mg/dL — AB
RBC / HPF: >50 RBC/hpf (ref 0–5)
Specific Gravity, Urine: 1.012 (ref 1.005–1.030)
WBC, UA: >50 WBC/hpf (ref 0–5)
pH: 5 (ref 5.0–8.0)

## 2023-05-17 LAB — CBC WITH DIFFERENTIAL/PLATELET
Abs Immature Granulocytes: 0.03 10*3/uL (ref 0.00–0.07)
Basophils Absolute: 0 10*3/uL (ref 0.0–0.1)
Basophils Relative: 0 %
Eosinophils Absolute: 0 10*3/uL (ref 0.0–0.5)
Eosinophils Relative: 0 %
HCT: 38.4 % (ref 36.0–46.0)
Hemoglobin: 12.6 g/dL (ref 12.0–15.0)
Immature Granulocytes: 0 %
Lymphocytes Relative: 24 %
Lymphs Abs: 2.5 10*3/uL (ref 0.7–4.0)
MCH: 29 pg (ref 26.0–34.0)
MCHC: 32.8 g/dL (ref 30.0–36.0)
MCV: 88.5 fL (ref 80.0–100.0)
Monocytes Absolute: 0.9 10*3/uL (ref 0.1–1.0)
Monocytes Relative: 8 %
Neutro Abs: 7.1 10*3/uL (ref 1.7–7.7)
Neutrophils Relative %: 68 %
Platelets: 375 10*3/uL (ref 150–400)
RBC: 4.34 MIL/uL (ref 3.87–5.11)
RDW: 12.4 % (ref 11.5–15.5)
WBC: 10.5 10*3/uL (ref 4.0–10.5)
nRBC: 0 % (ref 0.0–0.2)

## 2023-05-17 LAB — COMPREHENSIVE METABOLIC PANEL
ALT: 12 U/L (ref 0–44)
AST: 17 U/L (ref 15–41)
Albumin: 4.3 g/dL (ref 3.5–5.0)
Alkaline Phosphatase: 47 U/L (ref 38–126)
Anion gap: 11 (ref 5–15)
BUN: 8 mg/dL (ref 6–20)
CO2: 18 mmol/L — ABNORMAL LOW (ref 22–32)
Calcium: 9.3 mg/dL (ref 8.9–10.3)
Chloride: 108 mmol/L (ref 98–111)
Creatinine, Ser: 0.81 mg/dL (ref 0.44–1.00)
GFR, Estimated: >60 mL/min (ref >60)
Glucose, Bld: 106 mg/dL — ABNORMAL HIGH (ref 70–99)
Potassium: 3.4 mmol/L — ABNORMAL LOW (ref 3.5–5.1)
Sodium: 137 mmol/L (ref 135–145)
Total Bilirubin: 0.8 mg/dL (ref 0.0–1.2)
Total Protein: 7.5 g/dL (ref 6.5–8.1)

## 2023-05-17 LAB — PREGNANCY, URINE: Preg Test, Ur: NEGATIVE

## 2023-05-17 LAB — LACTIC ACID, PLASMA
Lactic Acid, Venous: 1.6 mmol/L (ref 0.5–1.9)
Lactic Acid, Venous: 1.4 mmol/L (ref 0.5–1.9)

## 2023-05-17 MED ORDER — CIPROFLOXACIN HCL 500 MG PO TABS: 500.0000 mg | ORAL_TABLET | Freq: Two times a day (BID) | ORAL | 0 refills | Status: AC

## 2023-05-17 MED ORDER — LORAZEPAM 2 MG/ML IJ SOLN
1.0000 mg | Freq: Once | INTRAMUSCULAR | Status: AC
  Administered 2023-05-17: 1 mg via INTRAVENOUS
  Filled 2023-05-17: qty 1

## 2023-05-17 MED ORDER — CIPROFLOXACIN HCL 500 MG PO TABS
500.0000 mg | ORAL_TABLET | Freq: Once | ORAL | Status: DC
  Filled 2023-05-17: qty 1

## 2023-05-17 MED ORDER — SODIUM CHLORIDE 0.9 % IV BOLUS
1000.0000 mL | Freq: Once | INTRAVENOUS | Status: AC
  Administered 2023-05-17: 1000 mL via INTRAVENOUS

## 2023-05-17 NOTE — ED Triage Notes (Signed)
 Pt BIBA from home. C/o L flank, and bladder pain post cystoureteroscopy with stent placement yesterday.   Pt has a lot of health anxiety- states they had previous experience with going septic. Did take an ativan and oxy this morning for their pain and anxiety.  Pt encouraged to take take slow deep breaths.

## 2023-05-17 NOTE — Telephone Encounter (Signed)
 Please see pt questions below regarding abx and radiation and advise.

## 2023-05-17 NOTE — Telephone Encounter (Signed)
 Patient had surgery yesterday and left message with answering service that she has medical questions

## 2023-05-17 NOTE — ED Provider Notes (Signed)
 [place] AT [facility] Provider Note   CSN: [id number] Arrival date & time: 05/17/23  4742     History  Chief Complaint  Patient presents with   Flank Pain   Anxiety    Samantha Nguyen is a 38 y.o. female.  With a history of illness anxiety disorder and nephrolithiasis status post left ureteral stent who presents to the ED for anxiety.  Patient underwent cystoscopy for left ureteral stone yesterday with Dr. Ronne Binning (urology).  Laser lithotripsy was performed on left ureteral stone and left JJ stent was placed.  She returns today with bilateral flank pain and anxiety.  No fevers chills abdominal pain chest pain or shortness of breath.  Patient reports taking Ativan at home but is worried that some of the medications she received perioperatively may have had an adverse effect and made her more anxious   Flank Pain  Anxiety       Home Medications Prior to Admission medications   Medication Sig Start Date End Date Taking? Authorizing Provider  ciprofloxacin (CIPRO) 500 MG tablet Take 1 tablet (500 mg total) by mouth every 12 (twelve) hours for 9 days. 05/17/23 05/26/23 Yes Royanne Foots, DO  cephALEXin (KEFLEX) 500 MG capsule Take 1 capsule (500 mg total) by mouth 4 (four) times daily. 05/12/23   Barrett, Horald Chestnut, PA-C  Cholecalciferol (VITAMIN D3) 1.25 MG (50000 UT) CAPS Take 1 capsule (1.25 mg total) by mouth once a week. AFTER the last weekly pill, start a daily OTC Vitamin D3 up to 4,000units. Patient taking differently: Take 1 capsule by mouth every Saturday. 03/25/23   Dulce Sellar, NP  cyanocobalamin (VITAMIN B12) 1000 MCG tablet Take 1,000 mcg by mouth daily.    [provider]  escitalopram (LEXAPRO) 10 MG tablet Take 15 mg by mouth daily. 03/21/23   [provider]  LORazepam (ATIVAN) 1 MG tablet Take 1 tablet (1 mg total) by mouth daily as needed for anxiety (panic attacks). Patient taking differently: Take 1 mg by mouth  3 (three) times daily as needed for anxiety (panic attacks). 04/14/23   Gareth Eagle, PA-C  mirtazapine (REMERON) 7.5 MG tablet Take 3.75 mg by mouth at bedtime as needed (sleep). 05/03/23   [provider]  oxyCODONE-acetaminophen (PERCOCET/ROXICET) 5-325 MG tablet Take 1 tablet by mouth every 6 (six) hours as needed for severe pain (pain score 7-10). 05/16/23   McKenzie, Mardene Celeste, MD  tamsulosin (FLOMAX) 0.4 MG CAPS capsule Take 1 capsule by mouth daily. 05/07/23   [provider]      Allergies    Sulfa antibiotics    Review of Systems   Review of Systems  Genitourinary:  Positive for flank pain.    Physical Exam Updated Vital Signs BP (!) 140/106   Pulse (!) 114   Temp 98.5 F (36.9 C)   Resp 18   LMP 05/04/2023 (Approximate)   SpO2 100%  Physical Exam Vitals and nursing note reviewed.  Constitutional:      General: She is in acute distress.  HENT:     Head: Normocephalic and atraumatic.  Eyes:     Pupils: Pupils are equal, round, and reactive to light.  Cardiovascular:     Rate and Rhythm: Normal rate and regular rhythm.  Pulmonary:     Effort: Pulmonary effort is normal.     Breath sounds: Normal breath sounds.  Abdominal:     Palpations: Abdomen is soft.     Tenderness: There is  no abdominal tenderness.  Skin:    General: Skin is warm and dry.  Neurological:     Mental Status: She is alert.  Psychiatric:     Comments: Anxious     ED Results / Procedures / Treatments   Labs (all labs ordered are listed, but only abnormal results are displayed) Labs Reviewed  URINALYSIS, ROUTINE W REFLEX MICROSCOPIC - Abnormal; Notable for the following components:      Result Value   APPearance HAZY (*)    Hgb urine dipstick LARGE (*)    Protein, ur 100 (*)    Leukocytes,Ua MODERATE (*)    Bacteria, UA RARE (*)    All other components within normal limits  COMPREHENSIVE METABOLIC PANEL - Abnormal; Notable for the following components:   Potassium  3.4 (*)    CO2 18 (*)    Glucose, Bld 106 (*)    All other components within normal limits  CULTURE, BLOOD (ROUTINE X 2)  CULTURE, BLOOD (ROUTINE X 2)  URINE CULTURE  CBC WITH DIFFERENTIAL/PLATELET  PREGNANCY, URINE  LACTIC ACID, PLASMA  LACTIC ACID, PLASMA    EKG None  Radiology DG C-Arm 1-60 Min-No Report Result Date: 05/16/2023 Fluoroscopy was utilized by the requesting physician.  No radiographic interpretation.    Procedures Procedures    Medications Ordered in ED Medications  ciprofloxacin (CIPRO) tablet 500 mg (has no administration in time range)  sodium chloride 0.9 % bolus 1,000 mL (0 mLs Intravenous Stopped 05/17/23 1246)  LORazepam (ATIVAN) injection 1 mg (1 mg Intravenous Given 05/17/23 1041)    ED Course/ Medical Decision Making/ A&P Clinical Course as of 05/17/23 1406  Tue May 17, 2023  1402 Pregnancy negative.  No significant leukocytosis or anemia.  No significant electrolyte abnormalities.  UA suggestive of UTI but this may be result of instrumentation yesterday.  Discussed this with the patient and she agrees with plan to start empiric p.o. antibiotics but now she needs to follow-up on the results of the urine culture to see if she needs to continue taking the antibiotics.  She is still quite anxious about potentially having "chronic kidney disease" but I tried to assure her that her renal function was normal here today.  She is appropriate for discharge with plan for PCP and urology follow-up in the office [MP]    Clinical Course User Index [MP] Royanne Foots, DO                                 Medical Decision Making 38 year old female with history as above including cystoscopy with left ureteral stent placement yesterday.  Presenting with anxiety bilateral flank pain.  Tachycardic anxious and tearful on exam.  Will obtain laboratory workup to evaluate for acute infectious etiology.  She may require repeat imaging if labs are significantly abnormal.  Will  provide IV fluids for rehydration tachycardia as well as anxiolytic given stated complaint of severe anxiety and history of illness anxiety disorder  Amount and/or Complexity of Data Reviewed Labs: ordered.  Risk Prescription drug management.           Final Clinical Impression(s) / ED Diagnoses Final diagnoses:  Flank pain  Anxiety  Urinary tract infection with hematuria, site unspecified    Rx / DC Orders ED Discharge Orders          Ordered    ciprofloxacin (CIPRO) 500 MG tablet  Every 12 hours  05/17/23 1406              Estelle June A, DO 05/17/23 1406

## 2023-05-17 NOTE — Discharge Instructions (Signed)
 You were seen in the emerged department for flank pain and anxiety Your blood work looked okay and there is no evidence of severe infection Your kidney function was normal As discussed, your urine test may show evidence of infection but this may also be a result of yesterday's procedure We need to be on the look out for the urine culture results to see if there is truly an infection In the event there is, we started you on antibiotics and gave you the first dose here Please pick up the rest of the antibiotics from your pharmacy and begin taking as directed If the culture does not show any bacterial growth you do not need to continue taking the antibiotics Return to the emergency department for severe pain or any other concerns Continue taking all previously prescribed medications including your Ativan at home

## 2023-05-17 NOTE — Telephone Encounter (Signed)
 Verbal from Maralyn Sago, ok to start abx rx'd by the hospital.  Patient advised of NP's response and will keep follow up to discuss her questions.

## 2023-05-17 NOTE — Telephone Encounter (Signed)
 Can you advise in Dr. Dimas Millin absence, please.

## 2023-05-17 NOTE — Telephone Encounter (Signed)
 Patient's questions on mychart were forwarded to MD for review.

## 2023-05-17 NOTE — Telephone Encounter (Signed)
 Patient is asking how much radiation was used for the floroscopy.  She states she went to the ER becuause she woke up hot after surgery.  Patient denies fever, she states the ER told her they suspect a UTI and sent her home with abx.  She is asking if she needs to take the rx'd abx?  Patient has called several times, are you able to advise on the abx until we can address other questions with Dr. Ronne Binning in office tomorrow.

## 2023-05-18 ENCOUNTER — Ambulatory Visit (INDEPENDENT_AMBULATORY_CARE_PROVIDER_SITE_OTHER): Payer: MEDICAID | Admitting: Urology

## 2023-05-18 ENCOUNTER — Encounter: Payer: Self-pay | Admitting: Urology

## 2023-05-18 VITALS — BP 129/90 | HR 114 | Temp 98.4°F

## 2023-05-18 DIAGNOSIS — R4589 Other symptoms and signs involving emotional state: Secondary | ICD-10-CM

## 2023-05-18 DIAGNOSIS — Z09 Encounter for follow-up examination after completed treatment for conditions other than malignant neoplasm: Secondary | ICD-10-CM | POA: Diagnosis not present

## 2023-05-18 DIAGNOSIS — Z87442 Personal history of urinary calculi: Secondary | ICD-10-CM

## 2023-05-18 DIAGNOSIS — N200 Calculus of kidney: Secondary | ICD-10-CM

## 2023-05-18 HISTORY — DX: Other symptoms and signs involving emotional state: R45.89

## 2023-05-18 LAB — URINALYSIS, ROUTINE W REFLEX MICROSCOPIC
Bilirubin, UA: NEGATIVE
Glucose, UA: NEGATIVE
Nitrite, UA: NEGATIVE
Specific Gravity, UA: 1.02 (ref 1.005–1.030)
Urobilinogen, Ur: 0.2 mg/dL (ref 0.2–1.0)
pH, UA: 6 (ref 5.0–7.5)

## 2023-05-18 LAB — URINE CULTURE: Culture: NO GROWTH

## 2023-05-18 LAB — MICROSCOPIC EXAMINATION: RBC, Urine: >30 /HPF — AB (ref 0–2)

## 2023-05-18 NOTE — Telephone Encounter (Signed)
 Yes, you can stop after removing the stent.

## 2023-05-18 NOTE — Telephone Encounter (Signed)
 Patient called in office and advised on MD recommendation to pull stent

## 2023-05-18 NOTE — Progress Notes (Signed)
 Name: Samantha Nguyen DOB: 25-Jan-1986 MRN: [id number]  Diagnoses: 1) Post-operative state  HPI: Samantha Nguyen presents post-operatively.  - GU history includes:  1. Kidney stones.  Procedure: On 05/16/2023 Dr. Ronne Binning performed cystoscopy, bilateral ureteroscopy, left ureteroscopic stone manipulation, and placement of a tethered left ureteral stent. He was consulted today and reported:  - no stones found in right kidney - multiple stones removed from left kidney  - plan is for patient to remove tethered stent at home tomorrow (05/19/2023)  Postop course: Seen in ER yesterday (05/17/2023) for bilateral flank pain and anxiety. - Per provider note: "Pregnancy negative.  No significant leukocytosis or anemia.  No significant electrolyte abnormalities.  UA suggestive of UTI but this may be result of instrumentation yesterday.  Discussed this with the patient and she agrees with plan to start empiric p.o. antibiotics but now she needs to follow-up on the results of the urine culture to see if she needs to continue taking the antibiotics.  She is still quite anxious about potentially having "chronic kidney disease" but I tried to assure her that her renal function was normal here today.  She is appropriate for discharge with plan for PCP and urology follow-up in the office". - CBC with no leukocytosis or anemia. - CMP with normal renal function. - Urine microscopy: >50 WBC/hpf, >50 RBC/hpf, rare bacteria. - Urine culture result is pending; in process. - Cipro 500 mg 2x/day x9 days prescribed.  Today: She reports severe anxiety about her health due to PTSD from when she had sepsis related to a kidney stone in 2018. Also afraid of getting cancer due to radiation exposure re: imaging over time for stones. Has many questions today.   She did not start Cipro due to interaction with her Lexapro; currently taking Keflex. She reports dysuria, some hematuria, left flank pain. Denies fevers, nausea, or  vomiting.  Medications: Current Outpatient Medications  Medication Sig Dispense Refill   Cholecalciferol (VITAMIN D3) 1.25 MG (50000 UT) CAPS Take 1 capsule (1.25 mg total) by mouth once a week. AFTER the last weekly pill, start a daily OTC Vitamin D3 up to 4,000units. (Patient taking differently: Take 1 capsule by mouth every Saturday.) 12 capsule 0   cyanocobalamin (VITAMIN B12) 1000 MCG tablet Take 1,000 mcg by mouth daily.     escitalopram (LEXAPRO) 10 MG tablet Take 15 mg by mouth daily.     LORazepam (ATIVAN) 1 MG tablet Take 1 tablet (1 mg total) by mouth daily as needed for anxiety (panic attacks). (Patient taking differently: Take 1 mg by mouth 3 (three) times daily as needed for anxiety (panic attacks).) 4 tablet 0   mirtazapine (REMERON) 7.5 MG tablet Take 3.75 mg by mouth at bedtime as needed (sleep).     tamsulosin (FLOMAX) 0.4 MG CAPS capsule Take 1 capsule by mouth daily.     ciprofloxacin (CIPRO) 500 MG tablet Take 1 tablet (500 mg total) by mouth every 12 (twelve) hours for 9 days. 18 tablet 0   oxyCODONE-acetaminophen (PERCOCET/ROXICET) 5-325 MG tablet Take 1 tablet by mouth every 6 (six) hours as needed for severe pain (pain score 7-10). (Patient not taking: Reported on 05/18/2023) 30 tablet 0   No current facility-administered medications for this visit.    Allergies: Allergies  Allergen Reactions   Sulfa Antibiotics Other (See Comments)    Unknown childhood reaction    Past Medical History:  Diagnosis Date   Allergy 1985-06-18   Have had them my whole life   Anxiety  Chronic kidney disease    Costochondritis 09/02/2022   Depressed    History of kidney stones    MDD (major depressive disorder), recurrent episode, severe (HCC) 01/06/2023   MDD (major depressive disorder), single episode, severe (HCC) 04/28/2022   Migraine    Other chest pain 09/02/2022   Renal disorder    Tension-type headache, not intractable 07/27/2022   Past Surgical History:  Procedure  Laterality Date   CESAREAN SECTION     CYSTOSCOPY W/ URETERAL STENT PLACEMENT Right 08/24/2016   Procedure: CYSTOSCOPY WITH RETROGRADE PYELOGRAM/URETERAL STENT PLACEMENT;  Surgeon: Malen Gauze, MD;  Location: [facility];  Service: Urology;  Laterality: Right;   CYSTOSCOPY WITH RETROGRADE PYELOGRAM, URETEROSCOPY AND STENT PLACEMENT Right 09/09/2016   Procedure: CYSTOSCOPY WITH RETROGRADE PYELOGRAM, URETEROSCOPY AND STENT REPLACEMENT;  Surgeon: Malen Gauze, MD;  Location: [facility];  Service: Urology;  Laterality: Right;   CYSTOSCOPY WITH RETROGRADE PYELOGRAM, URETEROSCOPY AND STENT PLACEMENT Left 05/16/2023   Procedure: CYSTOURETEROSCOPY, WITH RETROGRADE PYELOGRAM AND STENT INSERTION;  Surgeon: Malen Gauze, MD;  Location: [facility];  Service: Urology;  Laterality: Left;   HOLMIUM LASER APPLICATION Left 05/16/2023   Procedure: HOLMIUM LASER APPLICATION;  Surgeon: Malen Gauze, MD;  Location: [facility];  Service: Urology;  Laterality: Left;   MANDIBLE SURGERY     Family History  Problem Relation Age of Onset   Hypertension Mother    Ulcerative colitis Mother    Kidney disease Mother    Alcohol abuse Father    Drug abuse Father    Anxiety disorder Father    Drug abuse Brother    Kidney disease Brother    Anxiety disorder Maternal Grandfather    Cancer Maternal Grandfather    Anxiety disorder Maternal Grandmother    Kidney disease Sister    Social History   Socioeconomic History   Marital status: Married    Spouse name: Not on file   Number of children: 1   Years of education: Not on file   Highest education level: Bachelor's degree (e.g., BA, AB, BS)  Occupational History   Not on file  Tobacco Use   Smoking status: Former    Current packs/day: 0.25    Average packs/day: 0.3 packs/day for 0.5 years (0.1 ttl pk-yrs)    Types: Cigarettes   Smokeless tobacco: Never   Tobacco comments:    I smoked off and on for a few months at a time but havent  smoked in like 8 years  Vaping Use   Vaping status: Former   Substances: CBD  Substance and Sexual Activity   Alcohol use: Yes    Comment: Hardly drink at all   Drug use: No   Sexual activity: Not Currently    Birth control/protection: Condom  Other Topics Concern   Not on file  Social History Narrative   Not on file   Social Drivers of Health   Financial Resource Strain: Medium Risk (03/24/2023)   Overall Financial Resource Strain (CARDIA)    Difficulty of Paying Living Expenses: Somewhat hard  Food Insecurity: No Food Insecurity (04/25/2023)   Received from [facility]   Hunger Vital Sign    Worried About Running Out of Food in the Last Year: Never true    Ran Out of Food in the Last Year: Never true  Recent Concern: Food Insecurity - Food Insecurity Present (03/24/2023)   Hunger Vital Sign    Worried About Running Out of Food in the Last Year: Sometimes true  Ran Out of Food in the Last Year: Sometimes true  Transportation Needs: No Transportation Needs (04/25/2023)   Received from [facility] - Transportation    Lack of Transportation (Medical): No    Lack of Transportation (Non-Medical): No  Recent Concern: Transportation Needs - Unmet Transportation Needs (03/24/2023)   PRAPARE - Transportation    Lack of Transportation (Medical): Yes    Lack of Transportation (Non-Medical): Yes  Physical Activity: Insufficiently Active (03/24/2023)   Exercise Vital Sign    Days of Exercise per Week: 1 day    Minutes of Exercise per Session: 20 min  Stress: Stress Concern Present (04/25/2023)   Received from [facility] - Occupational Stress Questionnaire    Feeling of Stress : Very much  Social Connections: Moderately Integrated (03/24/2023)   Social Connection and Isolation Panel [NHANES]    Frequency of Communication with Friends and Family: Twice a week    Frequency of Social Gatherings with Friends and Family: Once a week     Attends Religious Services: 1 to 4 times per year    Active Member of [employer] or Organizations: No    Attends Engineer, structural: Not on file    Marital Status: Married  Catering manager Violence: Not At Risk (04/25/2023)   Received from [facility]   HITS    Over the last 12 months how often did your partner physically hurt you?: Never    Over the last 12 months how often did your partner insult you or talk down to you?: Rarely    Over the last 12 months how often did your partner threaten you with physical harm?: Never    Over the last 12 months how often did your partner scream or curse at you?: Rarely    SUBJECTIVE  Review of Systems Constitutional: Patient denies change in strength lntegumentary: Patient denies acute skin changes Cardiovascular: Patient denies chest pain or syncope Respiratory: Patient denies shortness of breath Musculoskeletal: Patient denies muscle cramps or weakness Allergic/Immunologic: Patient denies recent allergic reaction(s) Endocrine: Patient denies heat/cold intolerance  GU: As per HPI.  OBJECTIVE Vitals:   05/18/23 1044  BP: (!) 129/90  Pulse: (!) 114  Temp: 98.4 F (36.9 C)   There is no height or weight on file to calculate BMI.  Physical Examination Constitutional: Tearful, trembling; non-toxic appearing  Cardiovascular: No visible lower extremity edema. Respiratory: The patient does not have audible wheezing/stridor; respirations do not appear labored  Gastrointestinal: Abdomen non-distended Musculoskeletal: Normal ROM of UEs  Skin: No obvious rashes/open sores  Neurologic: CN 2-12 grossly intact Psychiatric: Answered questions appropriately with anxious affect   ASSESSMENT Kidney stones - Plan: DG Abd 1 View, Urinalysis, Routine w reflex microscopic  Postop check - Plan: Urinalysis, Routine w reflex microscopic  Anxiety about health - Plan: Urinalysis, Routine w reflex microscopic  We reviewed the operative  procedures and findings. Discussed her many questions and concerns in detail. She was thoroughly reassured that there is no evidence of any acute findings at this time - nothing to suggest severe infection or cancer. No concern for AKI or CKD; her renal function was completely normal yesterday (creatinine 0.81, GFR >60).  Urine culture result pending from yesterday. Low suspicion for UTI at this time based on all available data. Discussed potential risks / benefits of continuing Keflex today. Discussed antibiotic stewardship in general to minimize lifetime risk for developing antibiotic resistance.  She was instructed about how  to remove the tethered left ureteral stent at home tomorrow (05/19/2023); states she successfully did that with prior stent in 2018.   She was advised that stone fragments were sent for compositional analysis, which may provide helpful information to aid in recommendations for preventing future stone formation. May take a few weeks for that result to come back from send-out lab.  Advised adequate hydration for stone prevention. Handout provided about stone prevention diet.  For her significant anxiety she was encouraged to follow up with her PCP and/or mental health provider(s).   Will plan to follow up in 6 months with [facility] for stone surveillance or sooner if needed.  Patient verbalized understanding of and agreement with current plan. All questions were answered.  PLAN Advised the following: Patient to remove tethered left ureteral stent at home tomorrow.  Maintain adequate fluid intake. Return in about 6 months (around 11/18/2023) for [facility], UA, & f/u with Evette Georges NP.   Orders Placed This Encounter  Procedures   DG Abd 1 View    Standing Status:   Future    Expected Date:   11/18/2023    Expiration Date:   05/17/2024    Reason for Exam (SYMPTOM  OR DIAGNOSIS REQUIRED):   kidney stone    Is patient pregnant?:   No    Preferred imaging location?:   [facility]   Urinalysis, Routine w reflex microscopic   Total time spent caring for the patient today was over 30 minutes. This includes time spent on the date of the visit reviewing the patient's chart before the visit, time spent during the visit, and time spent after the visit on documentation. Over 50% of that time was spent in face-to-face time with this patient for direct counseling. E&M based on time and complexity of medical decision making.  It has been explained that the patient is to follow regularly with their PCP in addition to all other providers involved in their care and to follow instructions provided by these respective offices. Patient advised to contact urology clinic if any urologic-pertaining questions, concerns, new symptoms or problems arise in the interim period.  Patient Instructions  >80% of stones are calcium oxalate. This type of stones forms when body either isn't clearing oxalate well enough, is making too much oxalate, or too little citrate. This results in oxalate binding to form crystals, which continue to aggregate and form stones.  Limiting calcium does not help, but limiting oxalate in the diet can help. Increasing citric acid intake may also help.  The following measures may help to prevent the recurrence of stones: Increase water intake to 2-2.5 liters per day May add citrus juice (lemon, lime or orange juice) to water Moderation in dairy foods Decrease in salt content 5. Low Oxalate diet: Oxylates are found in foods like Tomato, Spinach, red wine and chocolate (see additional resources below).  Internet resources for information regarding low oxalate diet: https://kidneystones.yangchunwu.com https://my.VerticalStretch.be  Foods Low in Sodium or Oxalate Foods You Can Eat  Drinks Coffee, fruit and veggie juice (using the recommended veggies), fruit punch  Fruits  Apples, apricots (fresh or canned), avocado, bananas, cherries (sweet), cranberries, grapefruit, red or green grapes, lemon and lime juice, melons, nectarines, papayas, peaches, pears, pineapples, oranges, strawberries (fresh), tangerines  Veggies Artichokes, asparagus, bamboo shoots, broccoli, brussels sprouts, cabbage, cauliflower, chayote squash, chicory, corn, cucumbers, endive, lettuce, lima beans, mushrooms, onions, peas, peppers, potatoes, radishes, rutabagas, zucchini  Breads, Cereals, Grains Egg noodles, rye bread, cooked and dry  cereals without nuts or bran, crackers with unsalted tops, white or wild rice  Meat, Meat Replacements, Fish, [employer], fish, poultry, eggs, egg whites, egg replacements  Soup Homemade soup (using the recommended veggies and meat), low-sodium bouillon, low-sodium canned  Desserts Cookies, cakes, ice cream, pudding without chocolate or nuts, candy without chocolate or nuts  Fats and Oils Butter, margarine, cream, oil, salad dressing, mayo  Other Foods Unsalted potato chips or pretzels, herbs (like garlic, garlic powder, onion powder), lemon juice, salt-free seasoning blends, vinegar  Other Foods Low in Oxalate Foods You Can Eat  Drinks Beer, cola, wine, buttermilk, lemonade or limeade (without added vitamin C), milk  Meat, Meat Replacements, Fish, [employer] meat, ham, bacon, hot dogs, bratwurst, sausage, chicken nuggets, cheddar cheese, canned fish and shellfish  Soup Tomato soup, cheese soup  Other Foods Coconuts, lemon or lime juices, sugar or sweeteners, jellies or jams (from the recommended list)   Moderate-Oxalate Foods Foods to Limit   Drinks Fruit and veggie juices (from the list below), chocolate milk, rice milk, hot cocoa, tea   Fruits Blackberries, blueberries, black currants, cherries (sour), fruit cocktail, mangoes, orange peel, prunes, purple plums   Veggies Baked beans, carrots, celery, green beans, parsnips, summer squash, tomatoes,  turnips   Breads, Cereals, Grains White bread, cornbread or cornmeal, white English muffins, saltine or soda crackers, brown rice, vanilla wafers, spaghetti and other noodles, firm tofu, bagels, oatmeal   Meat/meat replacements, fish, poultry Sardines   Desserts Chocolate cake   Fats and Oils Macadamia nuts, pistachio nuts, English walnuts   Other Foods Jams or jellies (made with the fruits above), pepper    High-Oxalate Foods Foods to Avoid Drinks Chocolate drink mixes, soy milk, Ovaltine, instant iced tea, fruit juices of fruits listed below Fruits Apricots (dried), red currants, figs, kiwi, plums, rhubarb Veggies Beans (wax, dried), beets and beet greens, chives, collard greens, eggplant, escarole, dark greens of all kinds, leeks, okra, parsley, rutabagas, spinach, Swiss chard, tomato paste, watercress Breads, Cereals, Grains Amaranth, barley, white corn flour, fried potatoes, fruitcake, grits, soybean products, sweet potatoes, wheat germ and bran, buckwheat flour, All Bran cereal, graham crackers, pretzels, whole wheat bread Meat/meat replacements, fish, poultry  Dried beans, peanut butter, soy burgers, miso  Desserts Carob, chocolate, marmalades Fats and Oils Nuts (peanuts, almonds, pecans, cashews, hazelnuts), nut butters, sesame seeds, tahini paste Other Foods Poppy seeds   Electronically signed by:  Donnita Falls, MSN, FNP-C, CUNP 05/18/2023 11:50 AM

## 2023-05-18 NOTE — Patient Instructions (Signed)
 >80% of stones are calcium oxalate. This type of stones forms when body either isn't clearing oxalate well enough, is making too much oxalate, or too little citrate. This results in oxalate binding to form crystals, which continue to aggregate and form stones.  Limiting calcium does not help, but limiting oxalate in the diet can help. Increasing citric acid intake may also help.  The following measures may help to prevent the recurrence of stones: Increase water intake to 2-2.5 liters per day May add citrus juice (lemon, lime or orange juice) to water Moderation in dairy foods Decrease in salt content 5. Low Oxalate diet: Oxylates are found in foods like Tomato, Spinach, red wine and chocolate (see additional resources below).  Internet resources for information regarding low oxalate diet: https://kidneystones.yangchunwu.com https://my.VerticalStretch.be  Foods Low in Sodium or Oxalate Foods You Can Eat  Drinks Coffee, fruit and veggie juice (using the recommended veggies), fruit punch  Fruits Apples, apricots (fresh or canned), avocado, bananas, cherries (sweet), cranberries, grapefruit, red or green grapes, lemon and lime juice, melons, nectarines, papayas, peaches, pears, pineapples, oranges, strawberries (fresh), tangerines  Veggies Artichokes, asparagus, bamboo shoots, broccoli, brussels sprouts, cabbage, cauliflower, chayote squash, chicory, corn, cucumbers, endive, lettuce, lima beans, mushrooms, onions, peas, peppers, potatoes, radishes, rutabagas, zucchini  Breads, Cereals, Grains Egg noodles, rye bread, cooked and dry cereals without nuts or bran, crackers with unsalted tops, white or wild rice  Meat, Meat Replacements, Fish, Recruitment consultant, fish, poultry, eggs, egg whites, egg replacements  Soup Homemade soup (using the recommended veggies and meat), low-sodium bouillon, low-sodium canned   Desserts Cookies, cakes, ice cream, pudding without chocolate or nuts, candy without chocolate or nuts  Fats and Oils Butter, margarine, cream, oil, salad dressing, mayo  Other Foods Unsalted potato chips or pretzels, herbs (like garlic, garlic powder, onion powder), lemon juice, salt-free seasoning blends, vinegar  Other Foods Low in Oxalate Foods You Can Eat  Drinks Beer, cola, wine, buttermilk, lemonade or limeade (without added vitamin C), milk  Meat, Meat Replacements, Fish, [employer] meat, ham, bacon, hot dogs, bratwurst, sausage, chicken nuggets, cheddar cheese, canned fish and shellfish  Soup Tomato soup, cheese soup  Other Foods Coconuts, lemon or lime juices, sugar or sweeteners, jellies or jams (from the recommended list)   Moderate-Oxalate Foods Foods to Limit   Drinks Fruit and veggie juices (from the list below), chocolate milk, rice milk, hot cocoa, tea   Fruits Blackberries, blueberries, black currants, cherries (sour), fruit cocktail, mangoes, orange peel, prunes, purple plums   Veggies Baked beans, carrots, celery, green beans, parsnips, summer squash, tomatoes, turnips   Breads, Cereals, Grains White bread, cornbread or cornmeal, white English muffins, saltine or soda crackers, brown rice, vanilla wafers, spaghetti and other noodles, firm tofu, bagels, oatmeal   Meat/meat replacements, fish, poultry Sardines   Desserts Chocolate cake   Fats and Oils Macadamia nuts, pistachio nuts, English walnuts   Other Foods Jams or jellies (made with the fruits above), pepper    High-Oxalate Foods Foods to Avoid Drinks Chocolate drink mixes, soy milk, Ovaltine, instant iced tea, fruit juices of fruits listed below Fruits Apricots (dried), red currants, figs, kiwi, plums, rhubarb Veggies Beans (wax, dried), beets and beet greens, chives, collard greens, eggplant, escarole, dark greens of all kinds, leeks, okra, parsley, rutabagas, spinach, Swiss chard, tomato paste,  watercress Breads, Cereals, Grains Amaranth, barley, white corn flour, fried potatoes, fruitcake, grits, soybean products, sweet potatoes, wheat germ and bran, buckwheat flour, All Bran cereal, graham crackers, pretzels, whole  wheat bread Meat/meat replacements, fish, poultry  Dried beans, peanut butter, soy burgers, miso  Desserts Carob, chocolate, marmalades Fats and Oils Nuts (peanuts, almonds, pecans, cashews, hazelnuts), nut butters, sesame seeds, tahini paste Other Foods Poppy seeds

## 2023-05-19 ENCOUNTER — Encounter (HOSPITAL_COMMUNITY): Payer: Self-pay | Admitting: Emergency Medicine

## 2023-05-19 ENCOUNTER — Other Ambulatory Visit: Payer: Self-pay

## 2023-05-19 ENCOUNTER — Emergency Department (HOSPITAL_COMMUNITY)
Admission: EM | Admit: 2023-05-19 | Discharge: 2023-05-19 | Discharge status: Against Medical Advice | Payer: MEDICAID | Attending: Emergency Medicine | Admitting: Emergency Medicine

## 2023-05-19 DIAGNOSIS — R109 Unspecified abdominal pain: Secondary | ICD-10-CM | POA: Insufficient documentation

## 2023-05-19 DIAGNOSIS — Z5321 Procedure and treatment not carried out due to patient leaving prior to being seen by health care provider: Secondary | ICD-10-CM | POA: Diagnosis not present

## 2023-05-19 DIAGNOSIS — R110 Nausea: Secondary | ICD-10-CM | POA: Diagnosis not present

## 2023-05-19 NOTE — Telephone Encounter (Signed)
 FYI and advise

## 2023-05-19 NOTE — ED Triage Notes (Signed)
 Patient BIB EMS c/o left flank pain x 3 days. Patient worsening flank pain this afternoon. Patient report she pulled out a strings from her stent placement this morning. Patient report nausea ,denies vomitting. Patient denies Fever. pati

## 2023-05-19 NOTE — Telephone Encounter (Signed)
 Patient noted to be currently at the ER.Marland KitchenMarland Kitchen

## 2023-05-20 ENCOUNTER — Emergency Department (HOSPITAL_BASED_OUTPATIENT_CLINIC_OR_DEPARTMENT_OTHER)
Admission: EM | Admit: 2023-05-20 | Discharge: 2023-05-20 | Disposition: A | Discharge status: Home / Self Care | Payer: MEDICAID | Attending: Emergency Medicine | Admitting: Emergency Medicine

## 2023-05-20 ENCOUNTER — Encounter: Payer: Self-pay | Admitting: Urology

## 2023-05-20 ENCOUNTER — Encounter (HOSPITAL_BASED_OUTPATIENT_CLINIC_OR_DEPARTMENT_OTHER): Payer: Self-pay | Admitting: Emergency Medicine

## 2023-05-20 ENCOUNTER — Emergency Department (HOSPITAL_BASED_OUTPATIENT_CLINIC_OR_DEPARTMENT_OTHER): Payer: MEDICAID

## 2023-05-20 DIAGNOSIS — N189 Chronic kidney disease, unspecified: Secondary | ICD-10-CM | POA: Insufficient documentation

## 2023-05-20 DIAGNOSIS — D75839 Thrombocytosis, unspecified: Secondary | ICD-10-CM | POA: Diagnosis not present

## 2023-05-20 DIAGNOSIS — Z79899 Other long term (current) drug therapy: Secondary | ICD-10-CM | POA: Insufficient documentation

## 2023-05-20 DIAGNOSIS — F419 Anxiety disorder, unspecified: Secondary | ICD-10-CM | POA: Diagnosis not present

## 2023-05-20 DIAGNOSIS — R1013 Epigastric pain: Secondary | ICD-10-CM | POA: Insufficient documentation

## 2023-05-20 LAB — COMPREHENSIVE METABOLIC PANEL
ALT: 11 U/L (ref 0–44)
AST: 12 U/L — ABNORMAL LOW (ref 15–41)
Albumin: 5.2 g/dL — ABNORMAL HIGH (ref 3.5–5.0)
Alkaline Phosphatase: 52 U/L (ref 38–126)
Anion gap: 13 (ref 5–15)
BUN: 5 mg/dL — ABNORMAL LOW (ref 6–20)
CO2: 22 mmol/L (ref 22–32)
Calcium: 10.1 mg/dL (ref 8.9–10.3)
Chloride: 102 mmol/L (ref 98–111)
Creatinine, Ser: 0.72 mg/dL (ref 0.44–1.00)
GFR, Estimated: >60 mL/min (ref >60)
Glucose, Bld: 104 mg/dL — ABNORMAL HIGH (ref 70–99)
Potassium: 4 mmol/L (ref 3.5–5.1)
Sodium: 137 mmol/L (ref 135–145)
Total Bilirubin: 0.5 mg/dL (ref 0.0–1.2)
Total Protein: 8.2 g/dL — ABNORMAL HIGH (ref 6.5–8.1)

## 2023-05-20 LAB — RESP PANEL BY RT-PCR (RSV, FLU A&B, COVID)  RVPGX2
Influenza A by PCR: NEGATIVE
Influenza B by PCR: NEGATIVE
Resp Syncytial Virus by PCR: NEGATIVE
SARS Coronavirus 2 by RT PCR: NEGATIVE

## 2023-05-20 LAB — CBC
HCT: 40.7 % (ref 36.0–46.0)
Hemoglobin: 13.8 g/dL (ref 12.0–15.0)
MCH: 28.9 pg (ref 26.0–34.0)
MCHC: 33.9 g/dL (ref 30.0–36.0)
MCV: 85.1 fL (ref 80.0–100.0)
Platelets: 433 10*3/uL — ABNORMAL HIGH (ref 150–400)
RBC: 4.78 MIL/uL (ref 3.87–5.11)
RDW: 12.3 % (ref 11.5–15.5)
WBC: 8.3 10*3/uL (ref 4.0–10.5)
nRBC: 0 % (ref 0.0–0.2)

## 2023-05-20 LAB — URINALYSIS, ROUTINE W REFLEX MICROSCOPIC
Bilirubin Urine: NEGATIVE
Glucose, UA: NEGATIVE mg/dL
Hgb urine dipstick: NEGATIVE
Ketones, ur: 15 mg/dL — AB
Leukocytes,Ua: NEGATIVE
Nitrite: NEGATIVE
Protein, ur: NEGATIVE mg/dL
Specific Gravity, Urine: 1.011 (ref 1.005–1.030)
pH: 7.5 (ref 5.0–8.0)

## 2023-05-20 LAB — PREGNANCY, URINE: Preg Test, Ur: NEGATIVE

## 2023-05-20 LAB — LIPASE, BLOOD: Lipase: 14 U/L (ref 11–51)

## 2023-05-20 MED ORDER — LORAZEPAM 2 MG/ML IJ SOLN
1.0000 mg | Freq: Once | INTRAMUSCULAR | Status: AC
  Administered 2023-05-20: 1 mg via INTRAVENOUS
  Filled 2023-05-20: qty 1

## 2023-05-20 NOTE — ED Notes (Signed)
 BIB EMS from home. C/o increased anxiety x 2 days. Had kidney stone removed on Monday. Endorses mild abd pain.   Melba Coon, RN

## 2023-05-20 NOTE — Telephone Encounter (Signed)
 Advise please.

## 2023-05-20 NOTE — ED Triage Notes (Signed)
 Pt bib [facility], endorses "nocturnal panic attacks", abd pain and n/v today. Recently "removed stent" from urethra. Pt anxious, crying in triage

## 2023-05-20 NOTE — ED Provider Notes (Signed)
 [place] [facility]GENC[facility] DEPARTMENT AT [facility] Provider Note   CSN: [id number] Arrival date & time: 05/20/23  1422     History  Chief Complaint  Patient presents with   Abdominal Pain    Samantha Nguyen is a 38 y.o. female.   Abdominal Pain   38 year old female presents emergency department with complaints of increased anxiety, abdominal pain, 1 episode of emesis today.  States that she has been having upper abdominal pain for the past couple of days.  Recently began taking Keflex for a UTI on Tuesday and developed abdominal pain the day after beginning this medication.  Reports decreased p.o. intake secondary  due to decreased appetite ever since beginning antibiotic.  Patient does state that she had recent cystoscopy ureteroscopy with stent insertion on 05/16/2023 secondary to ureteral stone.  States she remove the stent on Thursday while at home.  Had some temporary bleeding which has since ceased.  States she has had increased feelings of anxiety and feeling as if something is wrong.  Reports compliance with her at home Ativan which helps temporarily.  States she wants to make sure that her kidneys are okay and is requesting a renal ultrasound.  Denies any flank pain.  Denies any fever, chills, urinary symptoms, change in bowel habits.  Past medical history significant for MDD, costochondritis, CKD, kidney stone, hyperlipidemia and lumbar spondylosis  Home Medications Prior to Admission medications   Medication Sig Start Date End Date Taking? Authorizing Provider  Cholecalciferol (VITAMIN D3) 1.25 MG (50000 UT) CAPS Take 1 capsule (1.25 mg total) by mouth once a week. AFTER the last weekly pill, start a daily OTC Vitamin D3 up to 4,000units. Patient taking differently: Take 1 capsule by mouth every Saturday. 03/25/23   Dulce Sellar, NP  ciprofloxacin (CIPRO) 500 MG tablet Take 1 tablet (500 mg total) by mouth every 12 (twelve) hours for 9 days. 05/17/23 05/26/23  Royanne Foots, DO  cyanocobalamin (VITAMIN B12) 1000 MCG tablet Take 1,000 mcg by mouth daily.    [provider]  escitalopram (LEXAPRO) 10 MG tablet Take 15 mg by mouth daily. 03/21/23   [provider]  LORazepam (ATIVAN) 1 MG tablet Take 1 tablet (1 mg total) by mouth daily as needed for anxiety (panic attacks). Patient taking differently: Take 1 mg by mouth 3 (three) times daily as needed for anxiety (panic attacks). 04/14/23   Gareth Eagle, PA-C  mirtazapine (REMERON) 7.5 MG tablet Take 3.75 mg by mouth at bedtime as needed (sleep). 05/03/23   [provider]  oxyCODONE-acetaminophen (PERCOCET/ROXICET) 5-325 MG tablet Take 1 tablet by mouth every 6 (six) hours as needed for severe pain (pain score 7-10). Patient not taking: Reported on 05/18/2023 05/16/23   Malen Gauze, MD  tamsulosin (FLOMAX) 0.4 MG CAPS capsule Take 1 capsule by mouth daily. 05/07/23   [provider]      Allergies    Sulfa antibiotics    Review of Systems   Review of Systems  Gastrointestinal:  Positive for abdominal pain.  All other systems reviewed and are negative.   Physical Exam Updated Vital Signs BP (!) 120/90 (BP Location: Right Arm)   Pulse 89   Temp 98.3 F (36.8 C) (Oral)   Resp 16   Wt 63.5 kg   LMP 05/04/2023 (Approximate)   SpO2 100%   BMI 21.93 kg/m  Physical Exam Vitals and nursing note reviewed.  Constitutional:      General: She is not in acute distress.  Appearance: She is well-developed.  HENT:     Head: Normocephalic and atraumatic.  Eyes:     Conjunctiva/sclera: Conjunctivae normal.  Cardiovascular:     Rate and Rhythm: Normal rate and regular rhythm.     Heart sounds: No murmur heard. Pulmonary:     Effort: Pulmonary effort is normal. No respiratory distress.     Breath sounds: Normal breath sounds.  Abdominal:     Palpations: Abdomen is soft.     Tenderness: There is abdominal tenderness in the epigastric area. There is no right CVA  tenderness, left CVA tenderness or guarding. Negative signs include Murphy's sign.  Musculoskeletal:        General: No swelling.     Cervical back: Neck supple.  Skin:    General: Skin is warm and dry.     Capillary Refill: Capillary refill takes less than 2 seconds.  Neurological:     Mental Status: She is alert.  Psychiatric:        Mood and Affect: Mood is anxious.        Speech: Speech is rapid and pressured.        Behavior: Behavior is hyperactive.     ED Results / Procedures / Treatments   Labs (all labs ordered are listed, but only abnormal results are displayed) Labs Reviewed  COMPREHENSIVE METABOLIC PANEL - Abnormal; Notable for the following components:      Result Value   Glucose, Bld 104 (*)    BUN 5 (*)    Total Protein 8.2 (*)    Albumin 5.2 (*)    AST 12 (*)    All other components within normal limits  CBC - Abnormal; Notable for the following components:   Platelets 433 (*)    All other components within normal limits  URINALYSIS, ROUTINE W REFLEX MICROSCOPIC - Abnormal; Notable for the following components:   Ketones, ur 15 (*)    All other components within normal limits  RESP PANEL BY RT-PCR (RSV, FLU A&B, COVID)  RVPGX2  PREGNANCY, URINE  LIPASE, BLOOD    EKG None  Radiology US Renal Result Date: 05/20/2023 CLINICAL DATA:  Flank pain x1 day. EXAM: RENAL / URINARY TRACT ULTRASOUND COMPLETE COMPARISON:  May 11, 2023 FINDINGS: Right Kidney: Renal measurements: 10.6 cm x 5.4 cm x 5.8 cm = volume: 171.4 mL. Echogenicity within normal limits. A 5.9 mm shadowing echogenic nonobstructing renal calculus is seen within the mid right kidney. No mass or hydronephrosis visualized. Left Kidney: Renal measurements: 11.2 cm x 5.7 cm x 5.2 cm = volume: 174.4 mL. Echogenicity within normal limits. A 9.3 mm shadowing, echogenic nonobstructing renal calculus is seen within the mid left kidney. No mass or hydronephrosis visualized. Bladder: Appears normal for degree  of bladder distention. Other: None. IMPRESSION: Bilateral subcentimeter nonobstructing renal calculi. Electronically Signed   By: Aram Candela M.D.   On: 05/20/2023 19:52    Procedures Procedures    Medications Ordered in ED Medications  LORazepam (ATIVAN) injection 1 mg (1 mg Intravenous Given 05/20/23 1518)  LORazepam (ATIVAN) injection 1 mg (1 mg Intravenous Given 05/20/23 1806)    ED Course/ Medical Decision Making/ A&P                                 Medical Decision Making Amount and/or Complexity of Data Reviewed Labs: ordered. Radiology: ordered.  Risk Prescription drug management.   This patient presents to the  ED for concern of diarrhea, abdominal pain, this involves an extensive number of treatment options, and is a complaint that carries with it a high risk of complications and morbidity.  The differential diagnosis includes gastritis, PUD, cholecystitis, CBD pathology, SBO/LBO, volvulus, diverticulitis, appendicitis, nephrolithiasis, pyelonephritis, other   Co morbidities that complicate the patient evaluation  See HPI   Additional history obtained:  Additional history obtained from EMR External records from outside source obtained and reviewed including hospital records   Lab Tests:  I Ordered, and personally interpreted labs.  The pertinent results include: No leukocytosis.  No evidence of anemia.  Thrombocytosis of 433.  No electrolyte abnormalities.  No transaminitis.  No renal dysfunction.  UA with 15 ketones but otherwise unremarkable.  Lipase within normal limits.  Viral testing negative.   Imaging Studies ordered:  I ordered imaging studies including renal ultrasound I independently visualized and interpreted imaging which showed nonobstructing bilateral stones. I agree with the radiologist interpretation   Cardiac Monitoring: / EKG:  The patient was maintained on a cardiac monitor.  I personally viewed and interpreted the cardiac monitored  which showed an underlying rhythm of: Sinus rhythm   Consultations Obtained:  N/a   Problem List / ED Course / Critical interventions / Medication management  Anxiety, nausea I ordered medication including Ativan   Reevaluation of the patient after these medicines showed that the patient improved I have reviewed the patients home medicines and have made adjustments as needed   Social Determinants of Health:  Denies tobacco, illicit drug use.   Test / Admission - Considered:  Anxiety, nausea, epigastric abdominal pain Vitals signs significant for initial tachycardia which returned to within normal limits with time elapsed and medicines administered while in the emergency department.. Otherwise within normal range and stable throughout visit. Laboratory/imaging studies significant for: See above 38 year old female presents emergency department with complaints of increased anxiety, abdominal pain, 1 episode of emesis today. States that she has been having upper abdominal pain for the past couple of days. Recently began taking Keflex for a UTI on Tuesday and developed abdominal pain the day after beginning this medication. Reports decreased p.o. intake secondary due to decreased appetite ever since beginning antibiotic. Patient does state that she had recent cystoscopy ureteroscopy with stent insertion on 05/16/2023 secondary to ureteral stone. States she remove the stent on Thursday while at home. Had some temporary bleeding which has since ceased. States she has had increased feelings of anxiety and feeling as if something is wrong. Reports compliance with her at home Ativan which helps temporarily. States she wants to make sure that her kidneys are okay and is requesting a renal ultrasound. Denies any flank pain. Denies any fever, chills, urinary symptoms, change in bowel habits.  On exam, patient severely anxious.  Will not sit still in bed.  Very rapid anxious speech appreciated.  Patient  initially stated she was concerned about dying from seizure, stroke, pulmonary embolism, ruptured abdominal organ, etc.  Initially described feeling excessively anxious.  On exam, slight tenderness in epigastric region.  Laboratory studies obtained without evidence of acute emergent process.  Renal ultrasound showed bilateral nonobstructing stones.  Patient seem to be most worried about having recurrence of kidney stone.  Reassured by ultrasound imaging findings as well as laboratory studies.  Offered patient CT imaging of abdomen but she declined.  Treated with Ativan and did note significant improvement/resolution of symptoms.  Repeat abdominal exam benign.  Suspect that patient's symptoms mostly prompted by increasingly anxious  demeanor.  Will recommend continues of Ativan at home for treatment of increased anxious feelings and follow-up with her primary care for reassessment.  Treatment plan discussed length with patient and she acknowledged understanding was agreeable to said plan.  Patient overall well-appearing, afebrile in no acute distress. Worrisome signs and symptoms were discussed with the patient, and the patient acknowledged understanding to return to the ED if noticed. Patient was stable upon discharge.          Final Clinical Impression(s) / ED Diagnoses Final diagnoses:  Anxiety  Epigastric abdominal pain    Rx / DC Orders ED Discharge Orders     None         Peter Garter, [name] 05/20/23 2033    Alvira Monday, MD 05/20/23 2342

## 2023-05-20 NOTE — ED Notes (Signed)
 Patient called out reporting a sudden onset of feeling "cold" followed by nausea and dry heaving.

## 2023-05-20 NOTE — ED Notes (Signed)
 Pt transported to [name] via wheelchair. No acute distress at this time.

## 2023-05-20 NOTE — Discharge Instructions (Signed)
 As discussed, your lab test as well as renal ultrasound were normal.  Recommend follow-up with your primary care for reassessment.  Please do not hesitate to return to the emergency department if the worrisome signs and symptoms we discussed become apparent.

## 2023-05-21 NOTE — ED Notes (Signed)
 This RN with charge nurse Adora Fridge witnessing, wasted 0.5mg /1mL of IV ativan. Patient was no longer in the pyxis after delay in waste and unable to document waste in pyxis.

## 2023-05-22 LAB — CULTURE, BLOOD (ROUTINE X 2)
Culture: NO GROWTH
Culture: NO GROWTH
Special Requests: ADEQUATE

## 2023-05-23 ENCOUNTER — Encounter: Payer: MEDICAID | Admitting: Urology

## 2023-05-23 NOTE — Telephone Encounter (Signed)
 Please see pt concern below and advise

## 2023-05-23 NOTE — Anesthesia Postprocedure Evaluation (Signed)
 Anesthesia Post Note  Patient: Rozell Kettlewell  Procedure(s) Performed: Tammi Klippel, WITH RETROGRADE PYELOGRAM AND STENT INSERTION (Left: Ureter) HOLMIUM LASER APPLICATION (Left: Ureter)  Patient location during evaluation: Phase II Anesthesia Type: General Level of consciousness: awake Pain management: pain level controlled Vital Signs Assessment: post-procedure vital signs reviewed and stable Respiratory status: spontaneous breathing and respiratory function stable Cardiovascular status: blood pressure returned to baseline and stable Postop Assessment: no headache and no apparent nausea or vomiting Anesthetic complications: no Comments: Late entry   No notable events documented.   Last Vitals:  Vitals:   05/16/23 1545 05/16/23 1552  BP:  110/85  Pulse: 88 92  Resp: 14 15  Temp:  36.6 C  SpO2: 100% 100%    Last Pain:  Vitals:   05/17/23 1503  TempSrc:   PainSc: 0-No pain                 Windell Norfolk

## 2023-05-23 NOTE — Telephone Encounter (Signed)
 FYI and advise

## 2023-05-25 ENCOUNTER — Encounter: Payer: Self-pay | Admitting: Family

## 2023-05-25 LAB — CALCULI, WITH PHOTOGRAPH (CLINICAL LAB)
Calcium Oxalate Dihydrate: 20 %
Calcium Oxalate Monohydrate: 80 %
Weight Calculi: 99 mg

## 2023-05-25 NOTE — Telephone Encounter (Signed)
 Patient scheduled with NP to discuss imaging per front desk.

## 2023-05-25 NOTE — Progress Notes (Unsigned)
 Name: Lossie Kalp DOB: [birth year] MRN: [id number]  History of Present Illness: Ms. Kirn is a 38 y.o. female who presents today for follow up visit at [facility]. - GU History: 1. Kidney stones. - June 2018: Episode of urosepsis secondary to obstructing right ureteral stone.  Recent history:  > 05/16/2023: Underwent cystoscopy, bilateral ureteroscopy, left ureteroscopic stone manipulation, and placement of a tethered left ureteral stent by Dr. Ronne Binning.  She has presented to the ER several times since surgery with severe anxiety / fear that something is wrong with her kidneys despite repeated reassurance from medical providers that her labs and imaging have persistently demonstrated no acute / concerning findings.  She has also seen her mental health provider for her severe anxiety.   Today: She presents today to discuss postop questions. She denies acute flank pain or abdominal pain. She denies fevers, nausea, or vomiting. She denies acute urinary symptoms.   Medications: Current Outpatient Medications  Medication Sig Dispense Refill   Cholecalciferol (VITAMIN D3) 1.25 MG (50000 UT) CAPS Take 1 capsule (1.25 mg total) by mouth once a week. AFTER the last weekly pill, start a daily OTC Vitamin D3 up to 4,000units. (Patient taking differently: Take 1 capsule by mouth every Saturday.) 12 capsule 0   cyanocobalamin (VITAMIN B12) 1000 MCG tablet Take 1,000 mcg by mouth daily.     escitalopram (LEXAPRO) 10 MG tablet Take 15 mg by mouth daily.     LORazepam (ATIVAN) 1 MG tablet Take 1 tablet (1 mg total) by mouth daily as needed for anxiety (panic attacks). (Patient taking differently: Take 1 mg by mouth 3 (three) times daily as needed for anxiety (panic attacks).) 4 tablet 0   mirtazapine (REMERON) 7.5 MG tablet Take 3.75 mg by mouth at bedtime as needed (sleep).     ciprofloxacin (CIPRO) 500 MG tablet Take 1 tablet (500 mg total) by mouth every 12 (twelve) hours for 9  days. 18 tablet 0   oxyCODONE-acetaminophen (PERCOCET/ROXICET) 5-325 MG tablet Take 1 tablet by mouth every 6 (six) hours as needed for severe pain (pain score 7-10). (Patient not taking: Reported on 05/26/2023) 30 tablet 0   tamsulosin (FLOMAX) 0.4 MG CAPS capsule Take 1 capsule by mouth daily. (Patient not taking: Reported on 05/26/2023)     No current facility-administered medications for this visit.    Allergies: Allergies  Allergen Reactions   Sulfa Antibiotics Other (See Comments)    Unknown childhood reaction    Past Medical History:  Diagnosis Date   Allergy 12/19/1985   Have had them my whole life   Anxiety    Chronic kidney disease    Costochondritis 09/02/2022   Depressed    History of kidney stones    MDD (major depressive disorder), recurrent episode, severe (HCC) 01/06/2023   MDD (major depressive disorder), single episode, severe (HCC) 04/28/2022   Migraine    Other chest pain 09/02/2022   Renal disorder    Tension-type headache, not intractable 07/27/2022   Past Surgical History:  Procedure Laterality Date   CESAREAN SECTION     CYSTOSCOPY W/ URETERAL STENT PLACEMENT Right 08/24/2016   Procedure: CYSTOSCOPY WITH RETROGRADE PYELOGRAM/URETERAL STENT PLACEMENT;  Surgeon: Malen Gauze, MD;  Location: [facility];  Service: Urology;  Laterality: Right;   CYSTOSCOPY WITH RETROGRADE PYELOGRAM, URETEROSCOPY AND STENT PLACEMENT Right 09/09/2016   Procedure: CYSTOSCOPY WITH RETROGRADE PYELOGRAM, URETEROSCOPY AND STENT REPLACEMENT;  Surgeon: Malen Gauze, MD;  Location[facility];  Service: Urology;  Laterality: Right;   CYSTOSCOPY WITH RETROGRADE PYELOGRAM, URETEROSCOPY AND STENT PLACEMENT Left 05/16/2023   Procedure: CYSTOURETEROSCOPY, WITH RETROGRADE PYELOGRAM AND STENT INSERTION;  Surgeon: Malen Gauze, MD;  Location: [facility];  Service: Urology;  Laterality: Left;   HOLMIUM LASER APPLICATION Left 05/16/2023   Procedure: HOLMIUM LASER  APPLICATION;  Surgeon: Malen Gauze, MD;  Location: [facility];  Service: Urology;  Laterality: Left;   MANDIBLE SURGERY     Family History  Problem Relation Age of Onset   Hypertension Mother    Ulcerative colitis Mother    Kidney disease Mother    Alcohol abuse Father    Drug abuse Father    Anxiety disorder Father    Drug abuse Brother    Kidney disease Brother    Anxiety disorder Maternal Grandfather    Cancer Maternal Grandfather    Anxiety disorder Maternal Grandmother    Kidney disease Sister    Social History   Socioeconomic History   Marital status: Married    Spouse name: Not on file   Number of children: 1   Years of education: Not on file   Highest education level: Bachelor's degree (e.g., BA, AB, BS)  Occupational History   Not on file  Tobacco Use   Smoking status: Former    Current packs/day: 0.25    Average packs/day: 0.3 packs/day for 0.5 years (0.1 ttl pk-yrs)    Types: Cigarettes   Smokeless tobacco: Never   Tobacco comments:    I smoked off and on for a few months at a time but havent smoked in like 8 years  Vaping Use   Vaping status: Former   Substances: CBD  Substance and Sexual Activity   Alcohol use: Not Currently    Comment: Hardly drink at all   Drug use: No   Sexual activity: Not Currently    Birth control/protection: Condom  Other Topics Concern   Not on file  Social History Narrative   Not on file   Social Drivers of Health   Financial Resource Strain: Medium Risk (05/22/2023)   Received from [facility]   Overall Financial Resource Strain (CARDIA)    Difficulty of Paying Living Expenses: Somewhat hard  Food Insecurity: Food Insecurity Present (05/22/2023)   [facility]   Hunger Vital Sign    Worried About Running Out of Food in the Last Year: Sometimes true    Ran Out of Food in the Last Year: Never true  Transportation Needs: No Transportation Needs (05/22/2023)   Received from [facility] -  Transportation    Lack of Transportation (Medical): No    Lack of Transportation (Non-Medical): No  Recent Concern: Transportation Needs - Unmet Transportation Needs (03/24/2023)   PRAPARE - Transportation    Lack of Transportation (Medical): Yes    Lack of Transportation (Non-Medical): Yes  Physical Activity: Unknown (05/22/2023)   [facility] from [facility]   Exercise Vital Sign    Days of Exercise per Week: 0 days    Minutes of Exercise per Session: Not on file  Recent Concern: Physical Activity - Insufficiently Active (03/24/2023)   Exercise Vital Sign    Days of Exercise per Week: 1 day    Minutes of Exercise per Session: 20 min  Stress: Stress Concern Present (05/22/2023)   Received from [facility] - Occupational Stress Questionnaire    Feeling of Stress : Very much  Social Connections: Somewhat Isolated (05/22/2023)  [facility] from [facility]   Social Network    How would you rate your social network (family, work, friends)?: Restricted participation with some degree of social isolation  Intimate Partner Violence: Not At Risk (05/22/2023)   Received from [facility]   HITS    Over the last 12 months how often did your partner physically hurt you?: Never    Over the last 12 months how often did your partner insult you or talk down to you?: Never    Over the last 12 months how often did your partner threaten you with physical harm?: Never    Over the last 12 months how often did your partner scream or curse at you?: Never    SUBJECTIVE  Review of Systems Constitutional: Patient denies any unintentional weight loss or change in strength lntegumentary: Patient denies any rashes or pruritus Cardiovascular: Patient denies chest pain or syncope Respiratory: Patient denies shortness of breath Gastrointestinal: As per HPI Musculoskeletal: Patient denies muscle cramps or weakness Neurologic: Patient denies convulsions or  seizures Allergic/Immunologic: Patient denies recent allergic reaction(s) Hematologic/Lymphatic: Patient denies bleeding tendencies Endocrine: Patient denies heat/cold intolerance  GU: As per HPI.  OBJECTIVE Vitals:   05/26/23 1120  BP: (!) 148/88  Pulse: (!) 103  Temp: 98.3 F (36.8 C)   There is no height or weight on file to calculate BMI.  Physical Examination Constitutional: No obvious distress; patient is non-toxic appearing  Cardiovascular: No visible lower extremity edema.  Respiratory: The patient does not have audible wheezing/stridor; respirations do not appear labored  Gastrointestinal: Abdomen non-distended Musculoskeletal: Normal ROM of UEs  Skin: No obvious rashes/open sores  Neurologic: CN 2-12 grossly intact Psychiatric: Answered questions appropriately with mildly anxious affect  Hematologic/Lymphatic/Immunologic: No obvious bruises or sites of spontaneous bleeding  UA: negative   ASSESSMENT Kidney stones - Plan: Urinalysis, Routine w reflex microscopic  Anxiety about health - Plan: Urinalysis, Routine w reflex microscopic  We had a detailed discussion of her urologic history. She was reassured that all recent lab results have shown no evidence of any acute GU abnormalities.   Stone analysis showed 100% calcium oxalate composition. For stone prevention advised adequate hydration and we discussed option to consider low oxalate diet given that calcium oxalate is the most common type of stone. Handout provided about stone prevention diet.   There has been some confusion about her current stone burden postoperatively since Dr. Ronne Binning previously stated that both kidneys appeared to be cleared of all stone fragments during her recent procedure on 05/16/2023 however postop RUS on 05/20/2023 demonstrated bilateral nephrolithiasis (5.9 mm mid right kidney stone and 9.3 mm mid left kidney stone). We discussed suspicion that those stones may be intraparenchymal and  therefore may not have been visible via ureteroscopy.   Patient seemed reassured following today's discussed but would prefer to discuss her surgical & postop findings with Dr. Ronne Binning directly as her surgeon, which is reasonable. Will plan for 1st available follow up with Dr. Ronne Binning so they can review her imaging results together and clarify any remaining concerns she may have.   Patient verbalized understanding and agreement. All questions were answered.   PLAN Advised the following: Maintain adequate fluid intake daily. Drink citrus juice (lemon, lime or orange juice) routinely. Low oxalate diet. Return for f/u with Dr. Ronne Binning 1st available for surgical followup to review images together.  Orders Placed This Encounter  Procedures   Urinalysis, Routine w reflex microscopic   Total time spent caring for the patient  today was over 45 minutes. This includes time spent on the date of the visit reviewing the patient's chart before the visit, time spent during the visit, and time spent after the visit on documentation. Over 50% of that time was spent in face-to-face time with this patient for direct counseling. E&M based on time and complexity of medical decision making.  It has been explained that the patient is to follow regularly with their PCP in addition to all other providers involved in their care and to follow instructions provided by these respective offices. Patient advised to contact urology clinic if any urologic-pertaining questions, concerns, new symptoms or problems arise in the interim period.  Patient Instructions  >80% of stones are calcium oxalate. This type of stones forms when body either isn't clearing oxalate well enough, is making too much oxalate, or too little citrate. This results in oxalate binding to form crystals, which continue to aggregate and form stones.  Limiting calcium does not help, but limiting oxalate in the diet can help. Increasing citric acid  intake may also help.  The following measures may help to prevent the recurrence of stones: Increase water intake to 2-2.5 liters per day May add citrus juice (lemon, lime or orange juice) to water Moderation in dairy foods Decrease in salt content 5. Low Oxalate diet: Oxylates are found in foods like Tomato, Spinach, red wine and chocolate (see additional resources below).  Internet resources for information regarding low oxalate diet: https://kidneystones.yangchunwu.com https://my.VerticalStretch.be  Foods Low in Sodium or Oxalate Foods You Can Eat  Drinks Coffee, fruit and veggie juice (using the recommended veggies), fruit punch  Fruits Apples, apricots (fresh or canned), avocado, bananas, cherries (sweet), cranberries, grapefruit, red or green grapes, lemon and lime juice, melons, nectarines, papayas, peaches, pears, pineapples, oranges, strawberries (fresh), tangerines  Veggies Artichokes, asparagus, bamboo shoots, broccoli, brussels sprouts, cabbage, cauliflower, chayote squash, chicory, corn, cucumbers, endive, lettuce, lima beans, mushrooms, onions, peas, peppers, potatoes, radishes, rutabagas, zucchini  Breads, Cereals, Grains Egg noodles, rye bread, cooked and dry cereals without nuts or bran, crackers with unsalted tops, white or wild rice  Meat, Meat Replacements, Fish, Recruitment consultant, fish, poultry, eggs, egg whites, egg replacements  Soup Homemade soup (using the recommended veggies and meat), low-sodium bouillon, low-sodium canned  Desserts Cookies, cakes, ice cream, pudding without chocolate or nuts, candy without chocolate or nuts  Fats and Oils Butter, margarine, cream, oil, salad dressing, mayo  Other Foods Unsalted potato chips or pretzels, herbs (like garlic, garlic powder, onion powder), lemon juice, salt-free seasoning blends, vinegar  Other Foods Low in Oxalate Foods You Can  Eat  Drinks Beer, cola, wine, buttermilk, lemonade or limeade (without added vitamin C), milk  Meat, Meat Replacements, Fish, [employer] meat, ham, bacon, hot dogs, bratwurst, sausage, chicken nuggets, cheddar cheese, canned fish and shellfish  Soup Tomato soup, cheese soup  Other Foods Coconuts, lemon or lime juices, sugar or sweeteners, jellies or jams (from the recommended list)   Moderate-Oxalate Foods Foods to Limit   Drinks Fruit and veggie juices (from the list below), chocolate milk, rice milk, hot cocoa, tea   Fruits Blackberries, blueberries, black currants, cherries (sour), fruit cocktail, mangoes, orange peel, prunes, purple plums   Veggies Baked beans, carrots, celery, green beans, parsnips, summer squash, tomatoes, turnips   Breads, Cereals, Grains White bread, cornbread or cornmeal, white English muffins, saltine or soda crackers, brown rice, vanilla wafers, spaghetti and other noodles, firm tofu, bagels, oatmeal   Meat/meat replacements, fish, poultry Sardines  Desserts Chocolate cake   Fats and Oils Macadamia nuts, pistachio nuts, English walnuts   Other Foods Jams or jellies (made with the fruits above), pepper    High-Oxalate Foods Foods to Avoid Drinks Chocolate drink mixes, soy milk, Ovaltine, instant iced tea, fruit juices of fruits listed below Fruits Apricots (dried), red currants, figs, kiwi, plums, rhubarb Veggies Beans (wax, dried), beets and beet greens, chives, collard greens, eggplant, escarole, dark greens of all kinds, leeks, okra, parsley, rutabagas, spinach, Swiss chard, tomato paste, watercress Breads, Cereals, Grains Amaranth, barley, white corn flour, fried potatoes, fruitcake, grits, soybean products, sweet potatoes, wheat germ and bran, buckwheat flour, All Bran cereal, graham crackers, pretzels, whole wheat bread Meat/meat replacements, fish, poultry  Dried beans, peanut butter, soy burgers, miso  Desserts Carob, chocolate, marmalades Fats and  Oils Nuts (peanuts, almonds, pecans, cashews, hazelnuts), nut butters, sesame seeds, tahini paste Other Foods Poppy seeds   Electronically signed by:  Donnita Falls, MSN, FNP-C, CUNP 05/26/2023 12:51 PM

## 2023-05-26 ENCOUNTER — Ambulatory Visit (INDEPENDENT_AMBULATORY_CARE_PROVIDER_SITE_OTHER): Payer: MEDICAID | Admitting: Urology

## 2023-05-26 ENCOUNTER — Encounter: Payer: Self-pay | Admitting: Urology

## 2023-05-26 VITALS — BP 148/88 | HR 103 | Temp 98.3°F

## 2023-05-26 DIAGNOSIS — N200 Calculus of kidney: Secondary | ICD-10-CM

## 2023-05-26 DIAGNOSIS — R4589 Other symptoms and signs involving emotional state: Secondary | ICD-10-CM

## 2023-05-26 LAB — URINALYSIS, ROUTINE W REFLEX MICROSCOPIC
Bilirubin, UA: NEGATIVE
Glucose, UA: NEGATIVE
Ketones, UA: NEGATIVE
Leukocytes,UA: NEGATIVE
Nitrite, UA: NEGATIVE
Protein,UA: NEGATIVE
RBC, UA: NEGATIVE
Specific Gravity, UA: 1.02 (ref 1.005–1.030)
Urobilinogen, Ur: 0.2 mg/dL (ref 0.2–1.0)
pH, UA: 7 (ref 5.0–7.5)

## 2023-05-26 NOTE — Patient Instructions (Signed)
 >80% of stones are calcium oxalate. This type of stones forms when body either isn't clearing oxalate well enough, is making too much oxalate, or too little citrate. This results in oxalate binding to form crystals, which continue to aggregate and form stones.  Limiting calcium does not help, but limiting oxalate in the diet can help. Increasing citric acid intake may also help.  The following measures may help to prevent the recurrence of stones: Increase water intake to 2-2.5 liters per day May add citrus juice (lemon, lime or orange juice) to water Moderation in dairy foods Decrease in salt content 5. Low Oxalate diet: Oxylates are found in foods like Tomato, Spinach, red wine and chocolate (see additional resources below).  Internet resources for information regarding low oxalate diet: https://kidneystones.yangchunwu.com https://my.VerticalStretch.be  Foods Low in Sodium or Oxalate Foods You Can Eat  Drinks Coffee, fruit and veggie juice (using the recommended veggies), fruit punch  Fruits Apples, apricots (fresh or canned), avocado, bananas, cherries (sweet), cranberries, grapefruit, red or green grapes, lemon and lime juice, melons, nectarines, papayas, peaches, pears, pineapples, oranges, strawberries (fresh), tangerines  Veggies Artichokes, asparagus, bamboo shoots, broccoli, brussels sprouts, cabbage, cauliflower, chayote squash, chicory, corn, cucumbers, endive, lettuce, lima beans, mushrooms, onions, peas, peppers, potatoes, radishes, rutabagas, zucchini  Breads, Cereals, Grains Egg noodles, rye bread, cooked and dry cereals without nuts or bran, crackers with unsalted tops, white or wild rice  Meat, Meat Replacements, Fish, Recruitment consultant, fish, poultry, eggs, egg whites, egg replacements  Soup Homemade soup (using the recommended veggies and meat), low-sodium bouillon, low-sodium canned   Desserts Cookies, cakes, ice cream, pudding without chocolate or nuts, candy without chocolate or nuts  Fats and Oils Butter, margarine, cream, oil, salad dressing, mayo  Other Foods Unsalted potato chips or pretzels, herbs (like garlic, garlic powder, onion powder), lemon juice, salt-free seasoning blends, vinegar  Other Foods Low in Oxalate Foods You Can Eat  Drinks Beer, cola, wine, buttermilk, lemonade or limeade (without added vitamin C), milk  Meat, Meat Replacements, Fish, [employer] meat, ham, bacon, hot dogs, bratwurst, sausage, chicken nuggets, cheddar cheese, canned fish and shellfish  Soup Tomato soup, cheese soup  Other Foods Coconuts, lemon or lime juices, sugar or sweeteners, jellies or jams (from the recommended list)   Moderate-Oxalate Foods Foods to Limit   Drinks Fruit and veggie juices (from the list below), chocolate milk, rice milk, hot cocoa, tea   Fruits Blackberries, blueberries, black currants, cherries (sour), fruit cocktail, mangoes, orange peel, prunes, purple plums   Veggies Baked beans, carrots, celery, green beans, parsnips, summer squash, tomatoes, turnips   Breads, Cereals, Grains White bread, cornbread or cornmeal, white English muffins, saltine or soda crackers, brown rice, vanilla wafers, spaghetti and other noodles, firm tofu, bagels, oatmeal   Meat/meat replacements, fish, poultry Sardines   Desserts Chocolate cake   Fats and Oils Macadamia nuts, pistachio nuts, English walnuts   Other Foods Jams or jellies (made with the fruits above), pepper    High-Oxalate Foods Foods to Avoid Drinks Chocolate drink mixes, soy milk, Ovaltine, instant iced tea, fruit juices of fruits listed below Fruits Apricots (dried), red currants, figs, kiwi, plums, rhubarb Veggies Beans (wax, dried), beets and beet greens, chives, collard greens, eggplant, escarole, dark greens of all kinds, leeks, okra, parsley, rutabagas, spinach, Swiss chard, tomato paste,  watercress Breads, Cereals, Grains Amaranth, barley, white corn flour, fried potatoes, fruitcake, grits, soybean products, sweet potatoes, wheat germ and bran, buckwheat flour, All Bran cereal, graham crackers, pretzels, whole  wheat bread Meat/meat replacements, fish, poultry  Dried beans, peanut butter, soy burgers, miso  Desserts Carob, chocolate, marmalades Fats and Oils Nuts (peanuts, almonds, pecans, cashews, hazelnuts), nut butters, sesame seeds, tahini paste Other Foods Poppy seeds

## 2023-06-07 ENCOUNTER — Ambulatory Visit
Admission: EM | Admit: 2023-06-07 | Discharge: 2023-06-07 | Disposition: A | Discharge status: Home / Self Care | Payer: MEDICAID

## 2023-06-07 DIAGNOSIS — H6502 Acute serous otitis media, left ear: Secondary | ICD-10-CM

## 2023-06-07 MED ORDER — AMOXICILLIN-POT CLAVULANATE 875-125 MG PO TABS: 1.0000 | ORAL_TABLET | Freq: Two times a day (BID) | ORAL | 0 refills | Status: DC

## 2023-06-07 NOTE — ED Provider Notes (Signed)
 Samantha Nguyen    CSN: [id number] Arrival date & time: 06/07/23  1001      History   Chief Complaint Chief Complaint  Patient presents with   Otalgia    HPI Samantha Nguyen is a 38 y.o. female.   Patient presents for evaluation of left-sided ear pain beginning 1 day ago.  No known sick contacts.  Tolerating food and liquids.  Has attempted use of Tylenol.  Denies fever, nasal congestion, sore throat or cough.  Past Medical History:  Diagnosis Date   Allergy 04/08/1985   Have had them my whole life   Anxiety    Chronic kidney disease    Costochondritis 09/02/2022   Depressed    History of kidney stones    MDD (major depressive disorder), recurrent episode, severe (HCC) 01/06/2023   MDD (major depressive disorder), single episode, severe (HCC) 04/28/2022   Migraine    Other chest pain 09/02/2022   Renal disorder    Tension-type headache, not intractable 07/27/2022    Patient Active Problem List   Diagnosis Date Noted   Anxiety about health 05/18/2023   Kidney stones 04/27/2023   Spondylolisthesis of lumbar region 04/27/2023   Lumbar spondylosis 04/27/2023   Vitamin B12 deficiency 02/16/2023   PTSD (post-traumatic stress disorder) 01/10/2023   Mixed hyperlipidemia 09/02/2022   MDD (major depressive disorder), recurrent episode, moderate (HCC) 05/02/2022   Severe anxiety with panic 05/02/2022   Illness anxiety disorder 04/28/2022    Past Surgical History:  Procedure Laterality Date   CESAREAN SECTION     CYSTOSCOPY W/ URETERAL STENT PLACEMENT Right 08/24/2016   Procedure: CYSTOSCOPY WITH RETROGRADE PYELOGRAM/URETERAL STENT PLACEMENT;  Surgeon: Malen Gauze, MD;  Location: [facility];  Service: Urology;  Laterality: Right;   CYSTOSCOPY WITH RETROGRADE PYELOGRAM, URETEROSCOPY AND STENT PLACEMENT Right 09/09/2016   Procedure: CYSTOSCOPY WITH RETROGRADE PYELOGRAM, URETEROSCOPY AND STENT REPLACEMENT;  Surgeon: Malen Gauze, MD;  Location: [facility];  Service: Urology;  Laterality: Right;   CYSTOSCOPY WITH RETROGRADE PYELOGRAM, URETEROSCOPY AND STENT PLACEMENT Left 05/16/2023   Procedure: CYSTOURETEROSCOPY, WITH RETROGRADE PYELOGRAM AND STENT INSERTION;  Surgeon: Malen Gauze, MD;  Location: [facility];  Service: Urology;  Laterality: Left;   HOLMIUM LASER APPLICATION Left 05/16/2023   Procedure: HOLMIUM LASER APPLICATION;  Surgeon: Malen Gauze, MD;  Location: [facility];  Service: Urology;  Laterality: Left;   MANDIBLE SURGERY      OB History   No obstetric history on file.      Home Medications    Prior to Admission medications   Medication Sig Start Date End Date Taking? Authorizing Provider  amoxicillin-clavulanate (AUGMENTIN) 875-125 MG tablet Take 1 tablet by mouth every 12 (twelve) hours. 06/07/23  Yes Navarro Nine, Elita Boone, NP  PARoxetine (PAXIL) 10 MG tablet Take by mouth. 06/05/23  Yes [provider]  Cholecalciferol (VITAMIN D3) 1.25 MG (50000 UT) CAPS Take 1 capsule (1.25 mg total) by mouth once a week. AFTER the last weekly pill, start a daily OTC Vitamin D3 up to 4,000units. Patient taking differently: Take 1 capsule by mouth every Saturday. 03/25/23   Dulce Sellar, NP  cyanocobalamin (VITAMIN B12) 1000 MCG tablet Take 1,000 mcg by mouth daily.    [provider]  escitalopram (LEXAPRO) 10 MG tablet Take 15 mg by mouth daily. 03/21/23   [provider]  LORazepam (ATIVAN) 1 MG tablet Take 1 tablet (1 mg total) by mouth daily as needed for anxiety (panic attacks). Patient taking differently: Take  1 mg by mouth 3 (three) times daily as needed for anxiety (panic attacks). 04/14/23   Gareth Eagle, PA-C  mirtazapine (REMERON) 7.5 MG tablet Take 3.75 mg by mouth at bedtime as needed (sleep). 05/03/23   [provider]  oxyCODONE-acetaminophen (PERCOCET/ROXICET) 5-325 MG tablet Take 1 tablet by mouth every 6 (six) hours as needed for severe pain (pain score 7-10). Patient  not taking: Reported on 05/26/2023 05/16/23   Malen Gauze, MD  tamsulosin (FLOMAX) 0.4 MG CAPS capsule Take 1 capsule by mouth daily. Patient not taking: Reported on 05/26/2023 05/07/23   [provider]    Family History Family History  Problem Relation Age of Onset   Hypertension Mother    Ulcerative colitis Mother    Kidney disease Mother    Alcohol abuse Father    Drug abuse Father    Anxiety disorder Father    Drug abuse Brother    Kidney disease Brother    Anxiety disorder Maternal Grandfather    Cancer Maternal Grandfather    Anxiety disorder Maternal Grandmother    Kidney disease Sister     Social History Social History   Tobacco Use   Smoking status: Former    Current packs/day: 0.25    Average packs/day: 0.3 packs/day for 0.5 years (0.1 ttl pk-yrs)    Types: Cigarettes   Smokeless tobacco: Never   Tobacco comments:    I smoked off and on for a few months at a time but havent smoked in like 8 years  Vaping Use   Vaping status: Former   Substances: CBD  Substance Use Topics   Alcohol use: Not Currently    Comment: Hardly drink at all   Drug use: No     Allergies   Sulfa antibiotics   Review of Systems Review of Systems   Physical Exam Triage Vital Signs ED Triage Vitals [06/07/23 1018]  Encounter Vitals Group     BP 118/75     Systolic BP Percentile      Diastolic BP Percentile      Pulse Rate 93     Resp 16     Temp 98.1 F (36.7 C)     Temp Source Temporal     SpO2 97 %     Weight      Height      Head Circumference      Peak Flow      Pain Score 5     Pain Loc      Pain Education      Exclude from Growth Chart    No data found.  Updated Vital Signs BP 118/75 (BP Location: Left Arm)   Pulse 93   Temp 98.1 F (36.7 C) (Temporal)   Resp 16   LMP 05/04/2023 (Approximate)   SpO2 97%   Visual Acuity Right Eye Distance:   Left Eye Distance:   Bilateral Distance:    Right Eye Near:   Left Eye Near:    Bilateral  Near:     Physical Exam Constitutional:      Appearance: Normal appearance.  HENT:     Right Ear: Hearing, tympanic membrane, ear canal and external ear normal.     Left Ear: Tympanic membrane is erythematous.  Eyes:     Extraocular Movements: Extraocular movements intact.  Pulmonary:     Effort: Pulmonary effort is normal.  Neurological:     Mental Status: She is alert and oriented to person, place, and time. Mental  status is at baseline.      UC Treatments / Results  Labs (all labs ordered are listed, but only abnormal results are displayed) Labs Reviewed - No data to display  EKG   Radiology No results found.  Procedures Procedures (including critical care time)  Medications Ordered in UC Medications - No data to display  Initial Impression / Assessment and Plan / UC Course  I have reviewed the triage vital signs and the nursing notes.  Pertinent labs & imaging results that were available during my care of the patient were reviewed by me and considered in my medical decision making (see chart for details).  Nonrecurrent  acute serous otitis media of left ear  Erythema to the tympanic membrane is consistent with infection,otherwise stable exam, prescribed Augmentin, advised against ear cleaning, may use over-the-counter analgesics and warm compresses to the external ear for comfort, may follow-up if symptoms persist worsen or recur  Final Clinical Impressions(s) / UC Diagnoses   Final diagnoses:  Non-recurrent acute serous otitis media of left ear     Discharge Instructions      Today you are being treated for an infection of the eardrum  Take Augmentin twice daily for 7 days, you should begin to see improvement after 48 hours of medication use and then it should progressively get better  You may use Tylenol or ibuprofen for management of discomfort  May hold warm compresses to the ear for additional comfort  Please not attempted any ear cleaning or  object or fluid placement into the ear canal to prevent further irritation    ED Prescriptions     Medication Sig Dispense Auth. Provider   amoxicillin-clavulanate (AUGMENTIN) 875-125 MG tablet Take 1 tablet by mouth every 12 (twelve) hours. 14 tablet Aryella Besecker, Elita Boone, NP      PDMP not reviewed this encounter.   Valinda Hoar, NP 06/07/23 1036

## 2023-06-07 NOTE — ED Triage Notes (Signed)
 Patient presents to [facility] for left ear pain x 1 day. No OTC meds for symptom relief.

## 2023-06-07 NOTE — Discharge Instructions (Signed)
 Today you are being treated for an infection of the eardrum  Take Augmentin  twice daily for 7 days, you should begin to see improvement after 48 hours of medication use and then it should progressively get better  You may use Tylenol or ibuprofen for management of discomfort  May hold warm compresses to the ear for additional comfort  Please not attempted any ear cleaning or object or fluid placement into the ear canal to prevent further irritation

## 2023-06-08 ENCOUNTER — Ambulatory Visit: Payer: MEDICAID

## 2023-06-10 ENCOUNTER — Telehealth: Payer: MEDICAID | Admitting: Physician Assistant

## 2023-06-10 DIAGNOSIS — H6990 Unspecified Eustachian tube disorder, unspecified ear: Secondary | ICD-10-CM | POA: Diagnosis not present

## 2023-06-10 MED ORDER — IPRATROPIUM BROMIDE 0.03 % NA SOLN: 2.0000 | Freq: Two times a day (BID) | NASAL | 0 refills | Status: DC

## 2023-06-10 NOTE — Progress Notes (Signed)
 I have spent 5 minutes in review of e-visit questionnaire, review and updating patient chart, medical decision making and response to patient.   Piedad Climes, PA-C

## 2023-06-10 NOTE — Progress Notes (Signed)
 Message sent to patient requesting further input regarding current symptoms. Awaiting patient response.

## 2023-06-10 NOTE — Progress Notes (Signed)
 E-Visit for Ear Pain - Eustachian Tube Dysfunction   We are sorry that you are not feeling well. Here is how we plan to help!  Based on what you have shared with me it looks like you have Eustachian Tube Dysfunction.  Eustachian Tube Dysfunction is a condition where the tubes that connect your middle ears to your upper throat become blocked. This can lead to discomfort, hearing difficulties and a feeling of fullness in your ear. Eustachian tube dysfunction usually resolves itself in a few days. The usual symptoms include: Hearing problems Tinnitus, or ringing in your ears Clicking or popping sounds A feeling of fullness in your ears Pain that mimics an ear infection Dizziness, vertigo or balance problems A "tickling" sensation in your ears  ?Eustachian tube dysfunction symptoms may get worse in higher altitudes. This is called barotrauma, and it can happen while scuba diving, flying in an airplane or driving in the mountains.   What causes eustachian tube dysfunction? Allergies and infections (like the common cold and the flu) are the most common causes of eustachian tube dysfunction. These conditions can cause inflammation and mucus buildup, leading to blockage. GERD, or chronic acid reflux, can also cause ETD. This is because stomach acid can back up into your throat and result in inflammation. As mentioned above, altitude changes can also cause ETD.   What are some common eustachian tube dysfunction treatments? In most cases, treatment isn't necessary because ETD often resolves on its own. However, you might need treatment if your symptoms linger for more than two weeks.    Eustachian tube dysfunction treatment depends on the cause and the severity of your condition. Treatments may include home remedies, medications or, in severe cases, surgery.     HOME CARE: Sometimes simple home remedies can help with mild cases of eustachian tube dysfunction. To try and clear the blockage, you  can: Chew gum. Yawn. Swallow. Try the Valsalva maneuver (breathing out forcefully while closing your mouth and pinching your nostrils). Use a saline spray to clear out nasal passages.  MEDICATIONS: Over-the-counter medications can help if allergies are causing eustachian tube dysfunction. Try antihistamines (like cetirizine or diphenhydramine) to ease your symptoms. If you have discomfort, pain relievers -- such as acetaminophen or ibuprofen -- can help.  Sometimes intranasal glucocorticosteroids (like Flonase or Nasacort) help.  I have prescribed Ipratropium Bromide nasal spray 0.03% two sprays in each nostril 2-3 times a day to start as directed along with continuation of your antibiotics given at time of in-person evaluation.    GET HELP RIGHT AWAY IF: Fever is over 102.2 degrees. You develop progressive ear pain or hearing loss. Ear symptoms persist longer than 3 days after treatment.  MAKE SURE YOU: Understand these instructions. Will watch your condition. Will get help right away if you are not doing well or get worse.  Thank you for choosing an e-visit.  Your e-visit answers were reviewed by a board certified advanced clinical practitioner to complete your personal care plan. Depending upon the condition, your plan could have included both over the counter or prescription medications.  Please review your pharmacy choice. Make sure the pharmacy is open so you can pick up the prescription now. If there is a problem, you may contact your provider through [employer] and have the prescription routed to another pharmacy.  Your safety is important to [name]. If you have drug allergies check your prescription carefully.   For the next 24 hours you can use MyChart to ask questions  about today's visit, request a non-urgent call back, or ask for a work or school excuse. You will get an email with a survey after your eVisit asking about your experience. We would appreciate your feedback.  I hope that your e-visit has been valuable and will aid in your recovery.

## 2023-06-15 ENCOUNTER — Telehealth: Payer: Self-pay

## 2023-06-15 ENCOUNTER — Ambulatory Visit: Payer: MEDICAID | Admitting: Urology

## 2023-06-15 ENCOUNTER — Encounter: Payer: Self-pay | Admitting: Family

## 2023-06-15 NOTE — Telephone Encounter (Signed)
 Copied from CRM [id number]. Topic: General - Other >> Jun 15, 2023  9:31 AM Eunice Blase wrote: Reason for CRM: NOCD per Ms. Ensmenger ph: [phone] secure line will fax release of information to start therapy. Please complete and return.   Noted.

## 2023-06-16 NOTE — Telephone Encounter (Signed)
 yes, needs to schedule OV, thx

## 2023-06-21 ENCOUNTER — Ambulatory Visit: Payer: MEDICAID | Admitting: Family

## 2023-06-22 ENCOUNTER — Ambulatory Visit: Payer: MEDICAID | Admitting: Family

## 2023-06-28 NOTE — Telephone Encounter (Signed)
 Yes can take of all this at her OV

## 2023-06-30 ENCOUNTER — Emergency Department

## 2023-06-30 ENCOUNTER — Ambulatory Visit: Payer: Self-pay

## 2023-06-30 ENCOUNTER — Observation Stay

## 2023-06-30 ENCOUNTER — Observation Stay
Admission: EM | Admit: 2023-06-30 | Discharge: 2023-07-01 | Disposition: A | Discharge status: Home / Self Care | Source: Ambulatory Visit | Attending: Student in an Organized Health Care Education/Training Program | Admitting: Student in an Organized Health Care Education/Training Program

## 2023-06-30 ENCOUNTER — Other Ambulatory Visit: Payer: Self-pay

## 2023-06-30 ENCOUNTER — Encounter: Payer: Self-pay | Admitting: Student in an Organized Health Care Education/Training Program

## 2023-06-30 DIAGNOSIS — R079 Chest pain, unspecified: Secondary | ICD-10-CM

## 2023-06-30 DIAGNOSIS — F1421 Cocaine dependence, in remission: Secondary | ICD-10-CM | POA: Insufficient documentation

## 2023-06-30 DIAGNOSIS — R0789 Other chest pain: Secondary | ICD-10-CM

## 2023-06-30 DIAGNOSIS — D500 Iron deficiency anemia secondary to blood loss (chronic): Principal | ICD-10-CM | POA: Insufficient documentation

## 2023-06-30 DIAGNOSIS — Z6841 Body Mass Index (BMI) 40.0 and over, adult: Secondary | ICD-10-CM | POA: Insufficient documentation

## 2023-06-30 DIAGNOSIS — F17210 Nicotine dependence, cigarettes, uncomplicated: Secondary | ICD-10-CM | POA: Insufficient documentation

## 2023-06-30 DIAGNOSIS — L732 Hidradenitis suppurativa: Secondary | ICD-10-CM | POA: Insufficient documentation

## 2023-06-30 DIAGNOSIS — Z716 Tobacco abuse counseling: Secondary | ICD-10-CM | POA: Insufficient documentation

## 2023-06-30 DIAGNOSIS — N920 Excessive and frequent menstruation with regular cycle: Secondary | ICD-10-CM | POA: Insufficient documentation

## 2023-06-30 DIAGNOSIS — Z7951 Long term (current) use of inhaled steroids: Secondary | ICD-10-CM | POA: Insufficient documentation

## 2023-06-30 DIAGNOSIS — Z79899 Other long term (current) drug therapy: Secondary | ICD-10-CM | POA: Insufficient documentation

## 2023-06-30 DIAGNOSIS — R0600 Dyspnea, unspecified: Secondary | ICD-10-CM | POA: Insufficient documentation

## 2023-06-30 DIAGNOSIS — F319 Bipolar disorder, unspecified: Secondary | ICD-10-CM | POA: Insufficient documentation

## 2023-06-30 DIAGNOSIS — M79662 Pain in left lower leg: Secondary | ICD-10-CM

## 2023-06-30 DIAGNOSIS — R0781 Pleurodynia: Secondary | ICD-10-CM | POA: Insufficient documentation

## 2023-06-30 DIAGNOSIS — Z8659 Personal history of other mental and behavioral disorders: Secondary | ICD-10-CM

## 2023-06-30 DIAGNOSIS — D509 Iron deficiency anemia, unspecified: Secondary | ICD-10-CM

## 2023-06-30 DIAGNOSIS — F4310 Post-traumatic stress disorder, unspecified: Secondary | ICD-10-CM | POA: Insufficient documentation

## 2023-06-30 DIAGNOSIS — I10 Essential (primary) hypertension: Secondary | ICD-10-CM

## 2023-06-30 DIAGNOSIS — K219 Gastro-esophageal reflux disease without esophagitis: Secondary | ICD-10-CM | POA: Insufficient documentation

## 2023-06-30 DIAGNOSIS — Z1152 Encounter for screening for COVID-19: Secondary | ICD-10-CM | POA: Insufficient documentation

## 2023-06-30 DIAGNOSIS — F418 Other specified anxiety disorders: Secondary | ICD-10-CM | POA: Insufficient documentation

## 2023-06-30 DIAGNOSIS — I509 Heart failure, unspecified: Secondary | ICD-10-CM | POA: Insufficient documentation

## 2023-06-30 DIAGNOSIS — E66813 Obesity, class 3: Secondary | ICD-10-CM | POA: Insufficient documentation

## 2023-06-30 DIAGNOSIS — G4730 Sleep apnea, unspecified: Secondary | ICD-10-CM | POA: Insufficient documentation

## 2023-06-30 DIAGNOSIS — J45909 Unspecified asthma, uncomplicated: Secondary | ICD-10-CM

## 2023-06-30 DIAGNOSIS — Z789 Other specified health status: Secondary | ICD-10-CM

## 2023-06-30 DIAGNOSIS — J449 Chronic obstructive pulmonary disease, unspecified: Secondary | ICD-10-CM | POA: Insufficient documentation

## 2023-06-30 DIAGNOSIS — I110 Hypertensive heart disease with heart failure: Secondary | ICD-10-CM | POA: Insufficient documentation

## 2023-06-30 DIAGNOSIS — R0602 Shortness of breath: Principal | ICD-10-CM

## 2023-06-30 LAB — EKG 12-LEAD
P: 69 deg
PR: 151 ms
QRS: 33 deg
QRSD: 93 ms
QT: 335 ms
QTc: 390 ms
Rate: 82 {beats}/min
T: 22 deg

## 2023-06-30 LAB — TROPONIN T 0 HR HIGH SENSITIVITY (IP/ED ONLY): TROP T 0 HR High Sensitivity: 11 ng/L (ref 0–11)

## 2023-06-30 LAB — BASIC METABOLIC PANEL
Anion Gap: 13 (ref 7–16)
CO2: 24 mmol/L (ref 20–28)
Calcium: 9.3 mg/dL (ref 8.8–10.2)
Chloride: 103 mmol/L (ref 96–108)
Creatinine: 0.96 mg/dL — ABNORMAL HIGH (ref 0.51–0.95)
Glucose: 100 mg/dL — ABNORMAL HIGH (ref 60–99)
Lab: 14 mg/dL (ref 6–20)
Potassium: 4.5 mmol/L (ref 3.3–5.1)
Sodium: 140 mmol/L (ref 133–145)
eGFR BY CREAT: 78 *

## 2023-06-30 LAB — RUQ PANEL (ED ONLY)
ALT: 15 U/L (ref 0–35)
AST: 21 U/L (ref 0–35)
Albumin: 4 g/dL (ref 3.5–5.2)
Alk Phos: 101 U/L (ref 35–105)
Amylase: 62 U/L (ref 28–100)
Bilirubin,Direct: <0.2 mg/dL (ref 0.0–0.3)
Bilirubin,Total: 0.2 mg/dL (ref 0.0–1.2)
Lipase: 27 U/L (ref 13–60)
Total Protein: 7 g/dL (ref 6.3–7.7)

## 2023-06-30 LAB — CBC AND DIFFERENTIAL
Baso # K/uL: 0.1 10*3/uL (ref 0.0–0.2)
Eos # K/uL: 0.1 10*3/uL (ref 0.0–0.5)
Hematocrit: 28 % — ABNORMAL LOW (ref 34–49)
Hemoglobin: 7.1 g/dL — ABNORMAL LOW (ref 11.2–16.0)
IMM Granulocytes #: 0.1 10*3/uL — ABNORMAL HIGH (ref 0.0–0.0)
IMM Granulocytes: 0.5 %
Lymph # K/uL: 2.1 10*3/uL (ref 1.0–5.0)
MCV: 77 fL (ref 75–100)
Mono # K/uL: 1 10*3/uL (ref 0.1–1.0)
Neut # K/uL: 9.9 10*3/uL — ABNORMAL HIGH (ref 1.5–6.5)
Nucl RBC # K/uL: 0 10*3/uL (ref 0.0–0.0)
Nucl RBC %: 0.3 /100{WBCs} — ABNORMAL HIGH (ref 0.0–0.2)
Platelets: 388 10*3/uL (ref 150–450)
RBC: 3.6 MIL/uL — ABNORMAL LOW (ref 4.0–5.5)
RDW: 18.8 % — ABNORMAL HIGH (ref 0.0–15.0)
Seg Neut %: 75.3 %
WBC: 13.2 10*3/uL — ABNORMAL HIGH (ref 3.5–11.0)

## 2023-06-30 LAB — PERFORMING LAB

## 2023-06-30 LAB — COVID/INFLUENZA A & B/RSV NAAT (PCR)
COVID-19 NAAT (PCR): NEGATIVE
Influenza A NAAT (PCR): NEGATIVE
Influenza B NAAT (PCR): NEGATIVE
RSV NAAT (PCR): NEGATIVE

## 2023-06-30 LAB — TROPONIN T 1 HR W/ DELTA HIGH SENSITIVITY
TROP T 0-1 HR DELTA High Sensitivity: 0 (ref 0–2)
TROP T 1 HR High Sensitivity: 11 ng/L (ref 0–11)

## 2023-06-30 LAB — MAGNESIUM: Magnesium: 2 mg/dL (ref 1.6–2.5)

## 2023-06-30 LAB — NT-PRO BNP: NT-pro BNP: 57 pg/mL (ref 0–450)

## 2023-06-30 LAB — INTERPRETATION,SPEC COAG

## 2023-06-30 LAB — TROPONIN T 3 HR W/ DELTA HIGH SENSITIVITY (IP/ED ONLY)
TROP T 0-3 HR DELTA High Sensitivity: 1 (ref 0–4)
TROP T 3 HR High Sensitivity: 12 ng/L (ref 0–14)

## 2023-06-30 LAB — TSH: TSH: 1.76 u[IU]/mL (ref 0.27–4.20)

## 2023-06-30 LAB — PREGNANCY TEST, SERUM: Preg,Serum: NEGATIVE

## 2023-06-30 LAB — T4, FREE: Free T4: 1.2 ng/dL (ref 0.9–1.7)

## 2023-06-30 LAB — D-DIMER, QUANTITATIVE: D-Dimer: 0.61 ug{FEU}/mL — ABNORMAL HIGH (ref 0.00–0.50)

## 2023-06-30 LAB — REVIEWED BY:

## 2023-06-30 LAB — SPEC COAG REVIEW

## 2023-06-30 MED ORDER — IOHEXOL 350 MG/ML (OMNIPAQUE) IV SOLN 500ML BOTTLE *I*
1.0000 mL | Freq: Once | INTRAVENOUS | Status: AC
Start: 2023-06-30 — End: 2023-06-30
  Administered 2023-06-30: 70 mL via INTRAVENOUS

## 2023-06-30 MED ORDER — SODIUM CHLORIDE 0.9 % FLUSH FOR PUMPS *I*
0.0000 mL/h | INTRAVENOUS | Status: DC | PRN
Start: 2023-06-30 — End: 2023-07-02

## 2023-06-30 MED ORDER — ACETAMINOPHEN 325 MG PO TABS *I*
650.0000 mg | ORAL_TABLET | Freq: Four times a day (QID) | ORAL | Status: DC | PRN
Start: 2023-06-30 — End: 2023-07-02

## 2023-06-30 MED ORDER — ONDANSETRON HCL 2 MG/ML IV SOLN *I*
4.0000 mg | Freq: Four times a day (QID) | INTRAMUSCULAR | Status: DC | PRN
Start: 2023-06-30 — End: 2023-07-02

## 2023-06-30 MED ORDER — APIXABAN 5 MG PO TABS *I*
10.0000 mg | ORAL_TABLET | Freq: Two times a day (BID) | ORAL | Status: DC
Start: 2023-06-30 — End: 2023-07-01
  Administered 2023-06-30 – 2023-07-01 (×2): 10 mg via ORAL
  Filled 2023-06-30 (×2): qty 2

## 2023-06-30 MED ORDER — CARVEDILOL 12.5 MG PO TABS *I*
12.5000 mg | ORAL_TABLET | Freq: Two times a day (BID) | ORAL | Status: DC
Start: 2023-06-30 — End: 2023-07-02
  Administered 2023-06-30 – 2023-07-01 (×3): 12.5 mg via ORAL
  Filled 2023-06-30 (×4): qty 1

## 2023-06-30 MED ORDER — LOSARTAN POTASSIUM 50 MG PO TABS *I*
50.0000 mg | ORAL_TABLET | Freq: Every day | ORAL | Status: DC
Start: 2023-06-30 — End: 2023-07-02
  Administered 2023-06-30 – 2023-07-01 (×2): 50 mg via ORAL
  Filled 2023-06-30 (×2): qty 1

## 2023-06-30 MED ORDER — DEXTROSE 5 % FLUSH FOR PUMPS *I*
0.0000 mL/h | INTRAVENOUS | Status: DC | PRN
Start: 2023-06-30 — End: 2023-07-02

## 2023-06-30 MED ORDER — PANTOPRAZOLE SODIUM 40 MG PO TBEC *I*
40.0000 mg | DELAYED_RELEASE_TABLET | Freq: Every day | ORAL | Status: DC
Start: 2023-06-30 — End: 2023-07-02
  Administered 2023-06-30 – 2023-07-01 (×2): 40 mg via ORAL
  Filled 2023-06-30 (×2): qty 1

## 2023-06-30 MED ORDER — MORPHINE SULFATE 4 MG/ML IV SOLN *WRAPPED*
4.0000 mg | Freq: Once | INTRAVENOUS | Status: AC
Start: 2023-06-30 — End: 2023-06-30
  Administered 2023-06-30: 4 mg via INTRAVENOUS
  Filled 2023-06-30: qty 1

## 2023-06-30 MED ORDER — OXYCODONE HCL 5 MG PO TABS *I*
5.0000 mg | ORAL_TABLET | ORAL | Status: DC | PRN
Start: 2023-06-30 — End: 2023-07-02
  Administered 2023-06-30 – 2023-07-01 (×5): 5 mg via ORAL
  Filled 2023-06-30 (×5): qty 1

## 2023-06-30 MED ORDER — FLUCONAZOLE 150 MG PO TABS *I*
150.0000 mg | ORAL_TABLET | Freq: Once | ORAL | Status: AC
Start: 2023-06-30 — End: 2023-07-01
  Administered 2023-07-01: 150 mg via ORAL
  Filled 2023-06-30 (×2): qty 1

## 2023-06-30 MED ORDER — NICOTINE 21 MG/24HR TD PT24 *I*
1.0000 | MEDICATED_PATCH | Freq: Every day | TRANSDERMAL | Status: DC
Start: 2023-06-30 — End: 2023-07-02
  Administered 2023-06-30 – 2023-07-01 (×2): 1 via TRANSDERMAL
  Filled 2023-06-30 (×2): qty 1

## 2023-06-30 MED ORDER — NICOTINE POLACRILEX 2 MG MT GUM *I*
2.0000 mg | CHEWING_GUM | OROMUCOSAL | Status: DC | PRN
Start: 2023-06-30 — End: 2023-07-02

## 2023-06-30 MED ORDER — TIZANIDINE HCL 4 MG PO TABS *I*
4.0000 mg | ORAL_TABLET | Freq: Four times a day (QID) | ORAL | Status: DC | PRN
Start: 2023-06-30 — End: 2023-07-02
  Filled 2023-06-30: qty 1

## 2023-06-30 MED ORDER — FERROUS SULFATE 325 (65 FE) MG PO TABS *WRAPPED* *I*
324.0000 mg | ORAL_TABLET | Freq: Every day | ORAL | Status: DC
Start: 2023-07-01 — End: 2023-07-02
  Administered 2023-07-01: 325 mg via ORAL
  Filled 2023-06-30: qty 1

## 2023-06-30 MED ORDER — APIXABAN 5 MG PO TABS *I*
5.0000 mg | ORAL_TABLET | Freq: Two times a day (BID) | ORAL | Status: DC
Start: 2023-06-30 — End: 2023-06-30

## 2023-06-30 MED ORDER — LIDOCAINE 4 % EX PATCH *I*
1.0000 | MEDICATED_PATCH | CUTANEOUS | Status: DC
Start: 2023-06-30 — End: 2023-07-02
  Administered 2023-06-30 – 2023-07-01 (×2): 1 via TRANSDERMAL
  Filled 2023-06-30 (×2): qty 1

## 2023-06-30 MED ORDER — KETOROLAC TROMETHAMINE 30 MG/ML IJ SOLN *I*
30.0000 mg | Freq: Once | INTRAMUSCULAR | Status: AC
Start: 2023-06-30 — End: 2023-06-30
  Administered 2023-06-30: 30 mg via INTRAVENOUS
  Filled 2023-06-30: qty 1

## 2023-06-30 MED ORDER — ALBUTEROL SULFATE (2.5 MG/3ML) 0.083% IN NEBU *I*
2.5000 mg | INHALATION_SOLUTION | Freq: Four times a day (QID) | RESPIRATORY_TRACT | Status: DC | PRN
Start: 2023-06-30 — End: 2023-07-02

## 2023-06-30 MED ORDER — IPRATROPIUM-ALBUTEROL 0.5-2.5 MG/3ML IN SOLN *I*
3.0000 mL | Freq: Four times a day (QID) | RESPIRATORY_TRACT | Status: AC
Start: 2023-06-30 — End: 2023-07-01
  Filled 2023-06-30 (×2): qty 3

## 2023-06-30 MED ORDER — ACETAMINOPHEN 500 MG PO TABS *I*
1000.0000 mg | ORAL_TABLET | Freq: Once | ORAL | Status: AC
Start: 2023-06-30 — End: 2023-06-30
  Administered 2023-06-30: 1000 mg via ORAL
  Filled 2023-06-30: qty 2

## 2023-06-30 NOTE — ED Notes (Addendum)
 Plan of Care     Assumed care. VSS. C/o ongoing chest pressure, pointing to stomach area, reported to covering provider, medicated for pain. Tele verified- NSR. Denies sob, distress, lightheadedness, dizziness. Also c/o vaginal itching, pending diflucan delivery by pharmacy.

## 2023-06-30 NOTE — Discharge Instructions (Addendum)
 You were seen in the emergency department for chest pain and shortness of breath.  Initially your work up was concerning for possible blood clots in your lungs, but repeat imaging of your lungs showed that the original chest scans were probably not accurate.      I have refilled your home medications and asked social work to voucher them if necessary.    I've also put referrals in to the medicine clinic as well as the OBGYN clinic.  It is very important that you actually GO TO YOUR FOLLOW UP APPOINTMENTS    The norethindrone  should be taken 5 mg once daily, but if you start to experience bleeding you can increase to twice daily.  I sent enough medication through to get you by for a month or two.
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
## 2023-06-30 NOTE — Bed Hold Note (Signed)
 Bed: OU-84  Expected date: 06/30/23  Expected time: 10:52 AM  Means of arrival:   Comments:  Jenna [name], Jenna [name]

## 2023-06-30 NOTE — ED Triage Notes (Signed)
 Pt endorsing chest pain and SOB with one day, worse with inspiration and when she coughs.  Newly dx CHF.  EKG in triage.     Prehospital medications given: No

## 2023-06-30 NOTE — ED Notes (Addendum)
 Assessed and assumed patient care. Patient is A&Ox4 and walks independently. Telemetry placed on patient per provider order. Patient VS taken, IV assessed and patent. Patient complains of 7/10 cheat pain that is a sharp pain. Meds per [facility] given.Patient denies any n/v/d, SOB, lightheadedness, dizziness, and headache. Patient is oriented to unit. Patient denies any questions or concerns at this time. Patient call bell and bedside table within reach. Will continue to treat per order.

## 2023-06-30 NOTE — ED Notes (Addendum)
 Plan of Care     VS q4 hours   Meds per [facility]   Pain/symptom management   IV/A   Labs   Regular diet  Telemetry  Cards follows   Needs repeat CTA tomorrow; r/o PE   Echo needed   US  needed

## 2023-06-30 NOTE — ED Provider Progress Notes (Signed)
 ED Provider Progress Note  Pt received as sign out from the overnight team with plan for possible d/c pending d-dimer which was elevated.  Subsequent ct angio with suboptimal study but possible PE.  Will place in obs for consideration of anticoagulation and possible confirmatory repeat CT tomorrow to determine if real findings on initial.          Darcy Eaton, MD, 06/30/2023, 10:46 AM     Darcy Eaton, MD  06/30/23 1048

## 2023-06-30 NOTE — ED Notes (Addendum)
 Report Given To  Catie K., RN      Descriptive Sentence / Reason for Admission   BIBA, c/o Chest pain, shortness of breath, newly diagnosed with CHF. Chest pain worse with inspiration.       Active Issues / Relevant Events   Full Code  AOX4  Ambulatory at baseline  CT Chest Angio   Hgb: 7.1 (History of anemia)      To Do List  VS q4 hours   Meds per [facility]   Pain/symptom management   IV/A   Labs   Regular diet  Telemetry  Cards follows   Needs repeat CTA tomorrow; r/o PE   Echo needed   US  needed       Anticipatory Guidance / Discharge Planning  Observation

## 2023-07-01 ENCOUNTER — Observation Stay

## 2023-07-01 ENCOUNTER — Other Ambulatory Visit: Payer: Self-pay

## 2023-07-01 DIAGNOSIS — R079 Chest pain, unspecified: Secondary | ICD-10-CM

## 2023-07-01 DIAGNOSIS — I288 Other diseases of pulmonary vessels: Secondary | ICD-10-CM

## 2023-07-01 DIAGNOSIS — R0600 Dyspnea, unspecified: Secondary | ICD-10-CM

## 2023-07-01 LAB — ECHO COMPLETE
Aortic Diameter (mid tubular): 3.6 cm
Aortic Diameter (sino-tubular junction): 2.7 cm
Aortic Diameter (sinus of Valsalva): 3.1 cm
BMI: 59.9 kg/m2
BSA: 2.73 m2
Deceleration Time - MV: 295 ms
E/A ratio: 1.55
Heart Rate: 75 {beats}/min
Height: 65 in
IVC Diameter: 2.2 cm
LA Diameter BSA Index: 1.8 cm/m2
LA Diameter Height Index: 2.9 cm/m
LA Diameter: 4.8 cm
LA Systolic Vol BSA Index: 22 mL/m2
LA Systolic Vol Height Index: 36.3 mL/m
LA Systolic Volume: 60 mL
LV ASE Mass BSA Index: 156.6 g/m2
LV ASE Mass Height 2.7 Index: 110.4 g/m2
LV ASE Mass Height Index: 258.9 gm/m
LV ASE Mass: 427.4 g
LV CO BSA Index: 2.88 L/min/m2
LV Cardiac Output: 7.88 L/min
LV Diastolic Volume Index: 60.4 mL/m2
LV Isovolumic Relaxation Time: 49.5 ms
LV Posterior Wall Thickness: 1.5 cm
LV SV - LVOT SV Diff: 4.79 %
LV SV - LVOT SV Diff: 5.03 mL
LV SV BSA Index: 38.5 mL/m2
LV SV Height Index: 63.6 mL/m
LV Septal Thickness: 1.5 cm
LV Stroke Volume: 105 mL
LV Systolic Volume Index: 22 mL/m2
LV wall/cavity ratio: 0.5
LVED Diameter BSA Index: 2.2 cm/m2
LVED Diameter Height Index: 3.63 cm/m
LVED Diameter: 6 cm
LVED Volume BSA Index: 60 mL/m2
LVED Volume BSA Index: 60.4 mL/m2
LVED Volume Height Index: 99.9 mL/m
LVED Volume: 165 mL
LVEF (Volume): 64 %
LVES Volume BSA Index: 22 mL/m2
LVES Volume BSA Index: 22 mL/m2
LVES Volume Height Index: 36.3 mL/m
LVES Volume: 60 mL
LVOT Area (calculated): 4.52 cm2
LVOT Cardiac Index: 2.75 L/min/m2
LVOT Cardiac Output: 7.5 L/min
LVOT Diameter: 2.4 cm
LVOT PWD VTI: 22.11 cm
LVOT PWD Velocity (mean): 96.3 cm/s
LVOT PWD Velocity (peak): 138 cm/s
LVOT SV BSA Index: 36.62 mL/m2
LVOT SV Height Index: 60.6 mL/m
LVOT Stroke Rate (mean): 435.4 mL/s
LVOT Stroke Rate (peak): 624 mL/s
LVOT Stroke Volume: 99.97 cc
MR Regurgitant Fraction (LV SV Mtd): 0.05
MR Regurgitant Volume (LV SV Mtd): 5 mL
MV Peak A Velocity: 58.3 cm/s
MV Peak E Velocity: 90.6 cm/s
Mitral Annular E/Ea Vel Ratio: 13.85
Mitral Annular Ea Velocity: 6.54 cm/s
RA Volume BSA Index: 14.7 mL/m2
RA Volume Height Index: 24.2 mL/m
RA Volume: 40 mL
RR Interval: 800 ms
RVED Diameter BSA Index: 1.8 cm/m2
RVED Diameter Height Index: 2.9 cm/m
RVED Diameter: 4.8 cm
SEM (LVOT Mean) mN-s: 101.15 mN-s
SEM (LVOT peak) mN-s: 144.94 mN-s
Weight (lbs): 359 [lb_av]
Weight: 5744 [oz_av]

## 2023-07-01 LAB — TYPE AND SCREEN
ABO RH Blood Type: A POS
Antibody Screen: NEGATIVE

## 2023-07-01 LAB — BASIC METABOLIC PANEL
Anion Gap: 11 (ref 7–16)
CO2: 26 mmol/L (ref 20–28)
Calcium: 9.5 mg/dL (ref 8.8–10.2)
Chloride: 101 mmol/L (ref 96–108)
Creatinine: 0.89 mg/dL (ref 0.51–0.95)
Glucose: 125 mg/dL — ABNORMAL HIGH (ref 60–99)
Lab: 14 mg/dL (ref 6–20)
Potassium: 4.4 mmol/L (ref 3.3–5.1)
Sodium: 138 mmol/L (ref 133–145)
eGFR BY CREAT: 85 *

## 2023-07-01 LAB — CBC
Hematocrit: 25 % — ABNORMAL LOW (ref 34–49)
Hemoglobin: 6.7 g/dL — ABNORMAL LOW (ref 11.2–16.0)
MCV: 75 fL (ref 75–100)
Platelets: 343 10*3/uL (ref 150–450)
RBC: 3.4 MIL/uL — ABNORMAL LOW (ref 4.0–5.5)
RDW: 18.5 % — ABNORMAL HIGH (ref 0.0–15.0)
WBC: 9.8 10*3/uL (ref 3.5–11.0)

## 2023-07-01 LAB — BHCG, QUANT PREGNANCY: BHCG, QUANT PREGNANCY: <1 m[IU]/mL (ref 0–1)

## 2023-07-01 MED ORDER — IRON SUCROSE 20 MG/ML IV SOLN *I*
100.0000 mg | INTRAVENOUS | Status: DC
Start: 2023-07-01 — End: 2023-07-02
  Administered 2023-07-01: 100 mg via INTRAVENOUS
  Filled 2023-07-01: qty 5

## 2023-07-01 MED ORDER — IBUPROFEN 600 MG PO TABS *I*
600.0000 mg | ORAL_TABLET | Freq: Three times a day (TID) | ORAL | 0 refills | Status: DC | PRN
Start: 2023-07-01 — End: 2023-08-20
  Filled 2023-07-01: qty 21, 7d supply, fill #0

## 2023-07-01 MED ORDER — CARVEDILOL 6.25 MG PO TABS *I*
12.5000 mg | ORAL_TABLET | Freq: Two times a day (BID) | ORAL | 1 refills | Status: DC
Start: 2023-07-01 — End: 2023-12-09
  Filled 2023-07-01: qty 120, 30d supply, fill #0

## 2023-07-01 MED ORDER — CARBOXYMETHYLCELLULOSE SODIUM 0.5 % OP SOLN WRAPPED *I*
1.0000 [drp] | Freq: Three times a day (TID) | OPHTHALMIC | Status: DC | PRN
Start: 2023-07-01 — End: 2023-07-02
  Administered 2023-07-01: 1 [drp] via OPHTHALMIC
  Filled 2023-07-01 (×2): qty 1

## 2023-07-01 MED ORDER — CETIRIZINE HCL 5 MG PO TABS *I*
5.0000 mg | ORAL_TABLET | Freq: Every day | ORAL | 0 refills | Status: AC
Start: 2023-07-01 — End: 2023-07-31
  Filled 2023-07-01: qty 30, 30d supply, fill #0

## 2023-07-01 MED ORDER — NORETHINDRONE ACETATE 5 MG PO TABS *I*
5.0000 mg | ORAL_TABLET | Freq: Two times a day (BID) | ORAL | 0 refills | Status: AC
Start: 2023-07-01 — End: 2023-07-31
  Filled 2023-07-01: qty 60, 30d supply, fill #0

## 2023-07-01 MED ORDER — SODIUM CHLORIDE 0.9 % IV SOLN WRAPPED *I*
3.0000 mL/h | Status: DC
Start: 2023-07-01 — End: 2023-07-01
  Administered 2023-07-01: 3 mL/h via INTRAVENOUS

## 2023-07-01 MED ORDER — IOHEXOL 350 MG/ML (OMNIPAQUE) IV SOLN *I*
1.0000 mL | Freq: Once | INTRAVENOUS | Status: AC
Start: 2023-07-01 — End: 2023-07-01
  Administered 2023-07-01: 90 mL via INTRAVENOUS

## 2023-07-01 MED ORDER — PANTOPRAZOLE SODIUM 40 MG PO TBEC *I*
40.0000 mg | DELAYED_RELEASE_TABLET | Freq: Every day | ORAL | 0 refills | Status: DC
Start: 2023-07-01 — End: 2023-12-09
  Filled 2023-07-01: qty 30, 30d supply, fill #0

## 2023-07-01 MED ORDER — LOSARTAN POTASSIUM 50 MG PO TABS *I*
50.0000 mg | ORAL_TABLET | Freq: Every day | ORAL | 0 refills | Status: DC
Start: 2023-07-01 — End: 2023-12-09
  Filled 2023-07-01: qty 30, 30d supply, fill #0

## 2023-07-01 MED ORDER — FERROUS SULFATE 324 (65 FE) MG PO TBEC
324.0000 mg | DELAYED_RELEASE_TABLET | Freq: Every day | ORAL | 0 refills | Status: DC
Start: 2023-07-01 — End: 2023-12-09
  Filled 2023-07-01: qty 30, 30d supply, fill #0

## 2023-07-01 MED ORDER — CETIRIZINE HCL 10 MG PO TABS *I*
10.0000 mg | ORAL_TABLET | Freq: Once | ORAL | Status: AC
Start: 2023-07-01 — End: 2023-07-01
  Administered 2023-07-01: 10 mg via ORAL
  Filled 2023-07-01: qty 1

## 2023-07-01 MED ORDER — CETIRIZINE HCL 10 MG PO TABS *I*
10.0000 mg | ORAL_TABLET | Freq: Every day | ORAL | Status: DC
Start: 2023-07-01 — End: 2023-08-30
  Administered 2023-07-01: 10 mg via ORAL
  Filled 2023-07-01: qty 1

## 2023-07-01 NOTE — ED Notes (Signed)
 Writer went to administer zyrtec  and prn refresh drops. Pt sleeping, L side lying. Appears to be resting comfortably. RR 18.

## 2023-07-01 NOTE — ED Notes (Signed)
 H&H lab reported to covering provider. See new orders.

## 2023-07-01 NOTE — ED Obs Notes (Addendum)
 PROGRESS NOTE [facility] - LOCATION: EOU     Patient seen by me today, 07/01/2023 at 10:04 AM    History     Chief Complaint:   Chief Complaint   Patient presents with    Shortness of Breath    Chest Pain       Subjective:  actually feels better day on day; still with exertional dyspnea which is baseline.  Okay with blood transfusion and consent completed and placed into chart    Physical Exam   Vitals: Patient Vitals for the past 24 hrs:   BP Temp Temp src Pulse Resp SpO2   07/01/23 0943 149/80 36.6 C (97.9 F) TEMPORAL 83 20 99 %   07/01/23 0536 144/67 36.1 C (97 F) TEMPORAL 79 19 97 %   07/01/23 0015 140/90 36.4 C (97.5 F) TEMPORAL 92 18 100 %   06/30/23 1925 135/75 36.3 C (97.3 F) TEMPORAL 85 17 100 %   06/30/23 1552 149/68 35.8 C (96.4 F) TEMPORAL 83 20 99 %   06/30/23 1259 169/89 36.6 C (97.9 F) TEMPORAL 85 18 99 %   06/30/23 1231 170/67 -- -- 85 16 98 %   06/30/23 1056 150/82 -- -- 93 20 98 %       Physical Exam  Vitals and nursing note reviewed.   Constitutional:       General: She is not in acute distress.     Appearance: She is obese. She is not ill-appearing.   HENT:      Head: Normocephalic.      Mouth/Throat:      Mouth: Mucous membranes are moist.   Eyes:      Pupils: Pupils are equal, round, and reactive to light.   Cardiovascular:      Rate and Rhythm: Normal rate and regular rhythm.   Pulmonary:      Effort: Pulmonary effort is normal.      Breath sounds: Decreased breath sounds present.   Chest:      Chest wall: No mass or tenderness.   Abdominal:      Palpations: Abdomen is soft.   Musculoskeletal:         General: Normal range of motion.      Cervical back: Normal range of motion.   Skin:     General: Skin is warm and dry.   Neurological:      General: No focal deficit present.      Mental Status: She is alert and oriented to person, place, and time.   Psychiatric:         Mood and Affect: Mood normal.         Behavior: Behavior normal.       Tests     Labs:  All labs in the last 24  hours:   Recent Results (from the past 24 hours)   Basic metabolic panel    Collection Time: 07/01/23  5:35 AM   Result Value Ref Range    Glucose 125 (H) 60 - 99 mg/dL    Sodium 161 096 - 045 mmol/L    Potassium 4.4 3.3 - 5.1 mmol/L    Chloride 101 96 - 108 mmol/L    CO2 26 20 - 28 mmol/L    Anion Gap 11 7 - 16    UN 14 6 - 20 mg/dL    Creatinine 4.09 8.11 - 0.95 mg/dL    eGFR BY CREAT 85 *    Calcium  9.5 8.8 - 10.2  mg/dL   CBC    Collection Time: 07/01/23  5:35 AM   Result Value Ref Range    WBC 9.8 3.5 - 11.0 THOU/uL    RBC 3.4 (L) 4.0 - 5.5 MIL/uL    Hemoglobin 6.7 (L) 11.2 - 16.0 g/dL    Hematocrit 25 (L) 34 - 49 %    MCV 75 75 - 100 fL    RDW 18.5 (H) 0.0 - 15.0 %    Platelets 343 150 - 450 THOU/uL   BHCG, quant pregnancy    Collection Time: 07/01/23  5:35 AM   Result Value Ref Range    BHCG, QUANT PREGNANCY <1 0 - 1 mIU/mL   Echo Complete    Collection Time: 07/01/23  8:32 AM   Result Value Ref Range    BSA 2.73 m2    Height 65 in    BMI 59.9 kg/m2    Weight 5,744 oz    Weight (lbs) 359.00 lbs    LV Septal Thickness 1.50 cm    LVED Diameter 6.00 cm    LVOT Diameter 2.40 cm    LV Posterior Wall Thickness 1.50 cm    LV Isovolumic Relaxation Time 49.5 ms    LVOT PWD Velocity (peak) 138.0 cm/s    LVOT PWD Velocity (mean) 96.3 cm/s    LVOT PWD VTI 22.11 cm    LA Diameter 4.8 cm    MV Peak A Velocity 58.3 cm/s    Deceleration Time - MV 295 ms    MV Peak E Velocity 90.6 cm/s    Mitral Annular Ea Velocity 6.54 cm/s    Aortic Diameter (mid tubular) 3.6 cm    Aortic Diameter (sinus of Valsalva) 3.1 cm    Aortic Diameter (sino-tubular junction) 2.7 cm    LVED Diameter BSA Index 2.20 cm/m2    LVED Diameter Height Index 3.63 cm/m    LV ASE Mass 427.4 gm    LV ASE Mass Height 2.7 Index 110.4 gm/m2.7    LV ASE Mass BSA Index 156.6 gm/m2    LV ASE Mass Height Index 258.9 gm/m    LV wall/cavity ratio 0.50     LVOT Area (calculated) 4.52 cm2    LVOT Stroke Volume 99.97 cc    LVOT SV BSA Index 36.62 mL/m2    LVOT SV Height  Index 60.6 mL/m    LVOT Stroke Rate (peak) 624.0 mL/s    LVOT Stroke Rate (mean) 435.4 mL/s    SEM (LVOT peak) mN-s 144.94 mN-s    SEM (LVOT Mean) mN-s 101.15 mN-s    E/A ratio 1.55     Mitral Annular E/Ea Vel Ratio 13.85     LA Diameter BSA Index 1.8 cm/m2    LA Diameter Height Index 2.9 cm/m    Heart Rate 75 bpm    RR Interval 800.00 ms    LVOT Cardiac Output 7.50 L/min    LVOT Cardiac Index 2.75 L/min/m2    LVED Volume 165.0 mL    LVES Volume 60.0 mL    LVEF (Volume) 64 %    LVED Volume BSA Index 60.4 mL/m2    LVES Volume BSA Index 22.0 mL/m2    LVED Volume Height Index 99.9 mL/m    LVES Volume Height Index 36.3 mL/m    LV Stroke Volume 105.0 mL    LV Cardiac Output 7.88 L/min    LV SV BSA Index 38.5 mL/m2    LV SV Height Index 63.6 mL/m    LV  CO BSA Index 2.88 L/min/m2    LV SV - LVOT SV Diff 5.03 mL    LV SV - LVOT SV Diff 4.79 %    LA Systolic Volume 60.0 mL    LA Systolic Vol BSA Index 22.0 mL/m2    LA Systolic Vol Height Index 36.3 mL/m    IVC Diameter 2.2 cm    RA Volume 40.0 mL    RA Volume BSA Index 14.7 mL/m2    RA Volume Height Index 24.2 mL/m    MR Regurgitant Volume (LV SV Mtd) 5.0 mL    MR Regurgitant Fraction (LV SV Mtd) 0.05     LV Diastolic Volume Index 60.4 mL/m2    LV Systolic Volume Index 22.0 mL/m2    LVES Volume BSA Index 22 ml/m2    LVED Volume BSA Index 60 ml/m2    RVED Diameter 4.8 cm    RVED Diameter BSA Index 1.8 cm/m2    RVED Diameter Height Index 2.9 cm/m       Imaging results:  Echo Complete  Result Date: 07/01/2023  Concentric LVH with abnormal diastolic function and left atrial enlargement Borderline dilated LV with normal LVEF (~64%) and normal regional function No significant valvular abnormalities Unable to estimate PA systolic pressure.  RV appears mildly enlarged with preserved function     US  doppler vein bilateral lower extremities  Result Date: 06/30/2023  06/30/2023 6:24 PM BILATERAL LOWER EXTREMITY VENOUS ULTRASOUND CLINICAL INFORMATION: ? PE on CTA. COMPARISON: Lower  extremity Doppler ultrasound 2016 TECHNIQUE: Examination of bilateral lower extremities performed using gray scale, spectral and color Doppler Ultrasound. Augmentation at the levels of the common femoral and popliteal veins was performed when technically possible and tolerable by the patient. FINDINGS: RIGHT SIDE FINDINGS: Common Femoral Vein:  Phasic with normal compression. Normal augmentation. Saphenous Vein at junction with Common Femoral Vein: Normal compression. Profunda Femoral Vein: Normal compression. Femoral Vein:  Normal compression upper. Unable to compress mid and lower due to patient pain/depth. Appears patent with color and spectral Doppler. Popliteal Vein:  Normal compression. Normal augmentation. Posterior Tibial Veins:  Not visualized Anterior Tibial Veins:  Not visualized Peroneal Veins:  Not visualized LEFT SIDE FINDINGS: Common Femoral Vein:  Phasic with normal compression. Normal augmentation. Saphenous Vein at junction with Common Femoral Vein: Normal compression. Profunda Femoral Vein: Normal compression. Femoral Vein:  Normal compression upper. Unable to compress mid and lower due to patient pain/depth. Appears patent with color and spectral Doppler. Popliteal Vein:  Normal compression. Normal augmentation. Posterior Tibial Veins:  Not visualized Anterior Tibial Veins:  Not visualized  Peroneal Veins:  Not visualized     No evidence of deep venous thrombosis in the visualized lower extremity vessels. Please note that the calf veins were not visualized. END OF IMPRESSION       [facility] submits this DICOM format image data and final report to the [facility], an independent secure electronic health information exchange, on a reciprocally searchable basis (with patient authorization) for a minimum of 12 months after exam date.    CT angio chest  Result Date: 06/30/2023  06/30/2023 9:26 AM CT ANGIO CHEST CLINICAL INFORMATION: r/o PE. COMPARISON: Chest radiograph 06/30/2023 PROCEDURE:  CT  angiographic technique was performed of the pulmonary arteries. Multiplanar reconstructions and MIP reconstructions were reviewed. Automated exposure control, adjustment of the mA and/or kV according to patient size, and/or iterative reconstruction techniques were utilized for radiation dose optimization. Amount and type of contrast that was injected and/or discarded is  recorded in the electronic medical record. FINDINGS:  QUALITY: Limited due to suboptimal opacification of the pulmonary artery. Pulmonary Arteries: Small filling defect within a segmental right upper lobar pulmonary artery. 701-100 and 705-103. Small filling defect in a segmental branch of the left lower lobe. 701-130 Dilated pulmonary trunk 3.7 cm. Aorta and branch vessels: No acute aortic pathology. Veins: Not formally evaluated. Cardiac Chambers: Unremarkable. The right ventricle is normal in size and morphology. Pericardium: No pericardial effusion. Neck base:  Normal. Mediastinum and lymph nodes: No lymphadenopathy. Pleura and Pleural space:  Normal.  Airways (Trachea/Bronchi):  The trachea and bronchi are normal. Lungs: Small areas of dependent lung hyperattenuation with relative volume loss consistent with atelectasis. Upper abdomen:  Limited coverage and early contrast phase evaluation limits diagnostic evaluation.  No gross abnormality identified. Soft Tissues/Musculoskeletal: No acute or significant abnormalities noted.     The examination is limited for evaluation of the pulmonary arteries due to suboptimal opacification of the pulmonary artery. Small nonocclusive, linear filling defects within the left superior segmental artery and right superior lobar artery which may represent pulmonary emboli. No findings to suggest acute right heart strain. Dilated pulmonary trunk 3.7 cm. This finding is likely chronic in nature, may is seen with pulmonary hypertension. END OF IMPRESSION I have personally reviewed the images and the  Resident's/Fellow's interpretation and agree with or edited the findings.       [facility] submits this DICOM format image data and final report to the [facility], an independent secure electronic health information exchange, on a reciprocally searchable basis (with patient authorization) for a minimum of 12 months after exam date.    *Chest standard frontal and lateral views  Result Date: 06/30/2023  06/30/2023 6:14 AM CHEST X-RAYS CLINICAL INFORMATION: SOB. COMPARISON: 12/09/2014 PROCEDURE: Frontal and lateral projections of the chest were obtained. FINDINGS: Tubes and Catheters: None. Lungs: The lungs are clear. Pleura/Pleural space: No pleural effusion is present. No pneumothorax. Heart and Mediastinum: Normal. Additional Findings: No acute or aggressive bone abnormality.     No acute findings. END OF IMPRESSION I have personally reviewed the images and the Resident's/Fellow's interpretation and agree with or edited the findings.       [facility] submits this DICOM format image data and final report to the [facility], an independent secure electronic health information exchange, on a reciprocally searchable basis (with patient authorization) for a minimum of 12 months after exam date.    EKG 12 lead  Result Date: 06/30/2023  - NORMAL ECG -    Sinus rhythm         Medical Decision Making   Assessment/Plan:  Pleuritic Chest pain:  Work up thus far, supports PE although the CTA chest was not conclusive.  Plan to start eliquis  10 mg bid x 7 for now and repeat CTA chest to try to confirm/dispel diagnosis  Echocardiogram completed, duplex LE ultrasound negative for DVT    Add on beta hcg < 1     Iatrogenic CHF hx in setting of 5 unit pRBC transfusion  Asthma/COPD  Undiagnosed sleep apnea  On symbicort  at home, albuterol   Will order qid duonebs + prn albuterol  neb     Would recommend outpatient sleep study  CPAP ordered     For now no diuretics ordered     HTN:  Looks like she is supposed to be on carvedilol  but  has not routinely filled it.  Will restart/start carvedilol  12.5 bid     Was also somewhat  recently (05/17/23) started on losartan  50 during [facility] admission     Menorrhagia  Chronic iron  deficiency anemia  Crit currently 26, typically runs low.  Hemoglobin < 7 and 1 unit pRBC's ordered.  Consent placed into chart  C/W PO iron  supplement  C/w IV iron  supplement while in house     Has discussed hysterectomy and wants one but was also "kicked out" of her GYN office due to missed appointments.  If repeat CTA shows PE conclusively would reach out to GYN inpatient to discuss management given her hx of medical non-compliance    Says she followed with heme here for routine transfusions but was kicked out due to "no shows"     GERD: protonix      Hx cocaine  use     Psychosocial concerns: SW evaluation asked for     Hidradenitis: was on doxycycline  100 bid at one point, not currently using     Tobacco abuse: encouraged her to continue to cut down; says she has cut back by 3 or 4 cigarettes daily but does continue to smoke about PPD.       Discussions with Other Providers: N/A  Consultant teams previously spoken with by prior OBS Providers: ED Provider    Parenteral Controlled Substances: none  DVT prophylaxis: eliquis   Code status: full    SDoH affecting care (Long term issues affecting a patient's overall health):   Hx no shows/non-compliance    Disposition Barriers (More acute issues that are limiting a patient's discharge today):   Hx no shows/non-compliance        I personally spent 25 minutes on the calendar date of the encounter, including and pre and post visit work.              Author: Juleen Oakland, DO       Juleen Oakland, DO  07/01/23 1009       Juleen Oakland, DO  07/01/23 1009       Juleen Oakland, DO  07/01/23 1303       Juleen Oakland, DO  07/01/23 1316      Discharge Addendum 07/01/23 1441  Patient repeat CTA chest completed, original filling  defects seen yesterday no longer present and are suspected to have been artifactual.  The blood is hanging at present and patient feels better overall.  I also had an informal discussion with OBGYN who suggested that the best option for this patient would probably be an IUD but I had already broached that with the patient yesterday and she expressed hesitance based on negative experiences her friends/family have had.  I did not pursue what those experiences were.  I then discussed a hysterectomy with the patient as well, which she said had previously been discussed with her by other health care professionals and she is in favor of having this procedure done.  However, given patients history of 6 c-sections she would probably be a difficult candidate for this procedure and may need a specific OBGYN provider to do this.      In the end what I could do for this patient would be to RX a progesterone  only OCP, to help minimize menstrual bleeding.  I discussed this with the patient who is in favor of this approach for now, and says she will try to better utilize a phone alarm to increase compliance with her home medication regimen.      I stressed to the patient several times the importance of GOING  to follow up appointments which has been a barrier to her receiving better care and to avoid over utilizing the emergency department.  She expressed to me that she knows how to contact medicab to get to appointments and verbally promised that she would make her follow ups.    I also contacted patient husband, Blanchard Bunk, by telephone to give him an update.  He will be in this evening to pick patient up and take her home once her transfusion has finished.  Importantly, patient denies any vaginal bleeding at this time and I am confident we would have seen an appropriate increase in her H&H and I do not feel that we need to actually recheck it to verify.    Admit dx: chest pain, dyspnea, concern for PE  Discharge dx: acute on  chronic blood loss anemia (s/p 1 unit pRBC), dyspnea from anemia, low probability PE    Discharge medications:  STOP eliquis   Refill carvedilol  12.5 mg bid  Refill losartan  50 mg daily  Refill ferrous sulfate   START progesterone  5 mg bid    Discharge to home self care  Discharge referrals to medicine clinic, OBGYN  Discharge condition is good     Juleen Oakland, DO  07/01/23 1508

## 2023-07-01 NOTE — ED Notes (Addendum)
 Pt c/o cough from yesterday, despite not having any today or currently, asked her to explain her symptoms so appropriate assistance can be offered to which patient got upset, refused any other care from this nurse and requested to be assigned to another RN and removed tele monitor, reported to charge RN and covering provider.

## 2023-07-01 NOTE — ED Notes (Signed)
 Report Given To  Caryl Clas, RN      Descriptive Sentence / Reason for Admission   BIBA, c/o Chest pain, shortness of breath, newly diagnosed with CHF. Chest pain worse with inspiration. CTA suggestive of + PE      Active Issues / Relevant Events   Full Code  AOX4  Ambulatory   CT Chest Angio- 0730, can go off tele  Hgb: 6.7 (History of anemia/menorrhagia and on eliquis )      To Do List  VS q4 hours   Meds per [facility]   Pain/symptom management   IV/A   Labs   Regular diet  Telemetry  Cards follows   Needs repeat CTA tomorrow; r/o PE   Echo needed   US  completed      Anticipatory Guidance / Discharge Planning  Observation

## 2023-07-01 NOTE — ED Notes (Addendum)
 Writer to pt bedside as Consulting civil engineer. Covering Social worker that pt is requesting another Charity fundraiser. Writer spoke w/ pt and informed pt that pt's cannot demand another nurse d/t pt not liking or agreeing w/ RN. Covering RN Web designer that she was asking pt about her symptoms in order to properly medicate for the appropriate symptoms: Ie pt reports she was coughing yesterday and wanting cough suppressant despite not having symptoms currently. RN asked pt if she had allergies and pt became upset that the RN was asking too many questions that isn't her business and it is the doctors job. Pt refusing telemetry ATT.     Writer explained to pt that RNs will ask pts about symptoms and their history to ensure pt can receive or advocate on pt's behalf. Pt interrupted Clinical research associate that the RN is doing weird things and she doesn't like her. Writer reiterated that the RN is not being inappropriate and is doing her job. Writer informed pt that pts are not reassigned to others RNs based on pt's likes or dislikes and if pt wishes to refuse care that is her choice. RN has been attentive and appropriate with pt w/ her multiple requests. Patient Relations phone number provided to patient.

## 2023-07-01 NOTE — Progress Notes (Signed)
 07/01/23 1606   [facility] Patient Class Review   Patient Class Review Observation     Patient class effective as of 06/30/23.      Pt notified at bedside of OBS @ 1516 . Copy given to patient, and copy signed by patient for chart.          Vickki Grandchild RN BSN    Utilization Management     Secure chat or [phone]

## 2023-07-03 LAB — RED BLOOD CELLS
Coded Blood type: 6200
Component blood type: A POS
Dispense status: TRANSFUSED

## 2023-07-04 ENCOUNTER — Ambulatory Visit: Payer: MEDICAID | Admitting: Family

## 2023-07-06 ENCOUNTER — Encounter: Payer: Self-pay | Admitting: Family

## 2023-07-06 ENCOUNTER — Ambulatory Visit (INDEPENDENT_AMBULATORY_CARE_PROVIDER_SITE_OTHER): Payer: MEDICAID | Admitting: Family

## 2023-07-06 VITALS — BP 131/80 | HR 109 | Temp 98.0°F | Ht 67.0 in | Wt 141.0 lb

## 2023-07-06 DIAGNOSIS — Z789 Other specified health status: Secondary | ICD-10-CM

## 2023-07-06 DIAGNOSIS — R790 Abnormal level of blood mineral: Secondary | ICD-10-CM

## 2023-07-06 DIAGNOSIS — F4521 Hypochondriasis: Secondary | ICD-10-CM

## 2023-07-06 DIAGNOSIS — J3089 Other allergic rhinitis: Secondary | ICD-10-CM

## 2023-07-06 DIAGNOSIS — E559 Vitamin D deficiency, unspecified: Secondary | ICD-10-CM

## 2023-07-06 DIAGNOSIS — H938X2 Other specified disorders of left ear: Secondary | ICD-10-CM

## 2023-07-06 DIAGNOSIS — E538 Deficiency of other specified B group vitamins: Secondary | ICD-10-CM | POA: Diagnosis not present

## 2023-07-06 MED ORDER — TRIAMCINOLONE ACETONIDE 55 MCG/ACT NA AERO: 1.0000 | INHALATION_SPRAY | Freq: Every day | NASAL | 2 refills | Status: AC

## 2023-07-06 NOTE — Assessment & Plan Note (Signed)
 No vitamin D  intake for 1-2 months, had taken high dose weekly pill, but missed a few doses due to broken pills in the bottle. Plan to assess vitamin D  levels and consider high-dose weekly supplementation if deficient. - Check vitamin D  levels. - Consider high-dose weekly vitamin D3 refill if levels are deficient. - F/U in 15m- 1year

## 2023-07-06 NOTE — Progress Notes (Signed)
 Patient ID: Samantha Nguyen, female    DOB: 1986-03-04, 38 y.o.   MRN: [id number]  Chief Complaint  Patient presents with   Ear Pain    Pt c/o left ear pain. Has tried benadryl  and allegra which did help slightly.    Hair/Scalp Problem    Pt c/o hair loss due to stress.   Discussed the use of AI scribe software for clinical note transcription with the patient, who gave verbal consent to proceed.  History of Present Illness The patient, with a history of anxiety, presents after a recent ER visit for dizziness and severe headaches while transitioning off Lexapro. She underwent a head scan which was reportedly normal. She has been attending a partial hospital program and has been started on Paxil .  She also reports hair loss, which she attributes to stress. She has been taking B12 tablets, but denies taking over the counter iron supplements. She reports a history of low ferritin levels, but normal-high iron saturation.  Additionally, the patient complains of ear discomfort, which she believes may be allergy-related. She had an ear infection approximately a month ago, treated with antibiotics, but the sensation of fullness in the ear has persisted. She has been taking Benadryl  and started Allegra today for associated allergy symptoms, including a runny nose and itchy eyes.  She also reports stopping her weekly vitamin D  supplement due to issues with the capsules breaking apart. She estimates it has been one to two months since she last took the supplement.  Assessment & Plan Anxiety - Anxiety potentially exacerbated by medication changes. Currently on Paxil  30mg  qd, doing well. - Continue POC with psychiatry  Ear Congestion Persistent left ear congestion post-ear infection. Fluid present behind tympanic membrane. Symptoms may persist for 1-2 months post-infection. - Sending Nasacort  for overall allergy symptom relief, may possibly help ear congestion.  Allergic Rhinitis Symptoms indicative of  allergic rhinitis. Nasacort  recommended as optimal allergy medication. - Send Nasacort  to pharmacy. - Advise Nasacort  one spray each nostril twice daily for a few days, then reduce to qd or qod for the next month. - Advise to try Nasacort  before using Allegra. - F/U prn  Vitamin B12 and D Deficiency Hx of monthly B12 injections for 2 mos, then has been taking OTC daily pill. No vitamin D  intake for 1-2 months, had taken high dose weekly pill, but missed a few doses due to broken pills in the bottle. Plan to assess vitamin D  levels and consider high-dose weekly supplementation if deficient. - Check vitamin D  levels. - Consider high-dose weekly vitamin D3 refill if levels are deficient. - F/U in 19m- 1year  Hair Loss Hair loss potentially stress-related. Previous labs showed low ferritin, normal iron saturation. - Check iron levels today.       Subjective:    Outpatient Medications Prior to Visit  Medication Sig Dispense Refill   Cholecalciferol (VITAMIN D3) 1.25 MG (50000 UT) CAPS Take 1 capsule (1.25 mg total) by mouth once a week. AFTER the last weekly pill, start a daily OTC Vitamin D3 up to 4,000units. (Patient taking differently: Take 1 capsule by mouth every Saturday.) 12 capsule 0   cyanocobalamin  (VITAMIN B12) 1000 MCG tablet Take 1,000 mcg by mouth daily.     LORazepam  (ATIVAN ) 1 MG tablet Take 1 tablet (1 mg total) by mouth daily as needed for anxiety (panic attacks). (Patient taking differently: Take 1 mg by mouth 3 (three) times daily as needed for anxiety (panic attacks). 0.5 mg) 4 tablet 0  OVER THE COUNTER MEDICATION allegra     PARoxetine  (PAXIL ) 30 MG tablet Take 30 mg by mouth daily.     amoxicillin -clavulanate (AUGMENTIN ) 875-125 MG tablet Take 1 tablet by mouth every 12 (twelve) hours. 14 tablet 0   escitalopram (LEXAPRO) 10 MG tablet Take 15 mg by mouth daily.     ipratropium (ATROVENT ) 0.03 % nasal spray Place 2 sprays into both nostrils every 12 (twelve) hours. 30 mL  0   mirtazapine (REMERON) 7.5 MG tablet Take 3.75 mg by mouth at bedtime as needed (sleep).     oxyCODONE -acetaminophen  (PERCOCET/ROXICET) 5-325 MG tablet Take 1 tablet by mouth every 6 (six) hours as needed for severe pain (pain score 7-10). (Patient not taking: Reported on 05/26/2023) 30 tablet 0   tamsulosin  (FLOMAX ) 0.4 MG CAPS capsule Take 1 capsule by mouth daily. (Patient not taking: Reported on 05/26/2023)     No facility-administered medications prior to visit.   Past Medical History:  Diagnosis Date   Allergy [birth year], 1985   Have had them my whole life   Anxiety    Chronic kidney disease    Costochondritis 09/02/2022   Depressed    History of kidney stones    MDD (major depressive disorder), recurrent episode, severe (HCC) 01/06/2023   MDD (major depressive disorder), single episode, severe (HCC) 04/28/2022   Migraine    Other chest pain 09/02/2022   Renal disorder    Tension-type headache, not intractable 07/27/2022   Past Surgical History:  Procedure Laterality Date   CESAREAN SECTION     CYSTOSCOPY W/ URETERAL STENT PLACEMENT Right 08/24/2016   Procedure: CYSTOSCOPY WITH RETROGRADE PYELOGRAM/URETERAL STENT PLACEMENT;  Surgeon: Marco Severs, MD;  Location: [facility];  Service: Urology;  Laterality: Right;   CYSTOSCOPY WITH RETROGRADE PYELOGRAM, URETEROSCOPY AND STENT PLACEMENT Right 09/09/2016   Procedure: CYSTOSCOPY WITH RETROGRADE PYELOGRAM, URETEROSCOPY AND STENT REPLACEMENT;  Surgeon: Marco Severs, MD;  Location: [facility];  Service: Urology;  Laterality: Right;   CYSTOSCOPY WITH RETROGRADE PYELOGRAM, URETEROSCOPY AND STENT PLACEMENT Left 05/16/2023   Procedure: CYSTOURETEROSCOPY, WITH RETROGRADE PYELOGRAM AND STENT INSERTION;  Surgeon: Marco Severs, MD;  Location: [facility];  Service: Urology;  Laterality: Left;   HOLMIUM LASER APPLICATION Left 05/16/2023   Procedure: HOLMIUM LASER APPLICATION;  Surgeon: Marco Severs, MD;  Location: [facility];  Service: Urology;  Laterality: Left;   MANDIBLE SURGERY     Allergies  Allergen Reactions   Sulfa Antibiotics Other (See Comments)    Unknown childhood reaction      Objective:    Physical Exam Vitals and nursing note reviewed.  Constitutional:      Appearance: Normal appearance.  Cardiovascular:     Rate and Rhythm: Normal rate and regular rhythm.  Pulmonary:     Effort: Pulmonary effort is normal.     Breath sounds: Normal breath sounds.  Musculoskeletal:        General: Normal range of motion.  Skin:    General: Skin is warm and dry.  Neurological:     Mental Status: She is alert.  Psychiatric:        Mood and Affect: Mood normal.        Behavior: Behavior normal.    BP 131/80 (BP Location: Left Arm, Patient Position: Sitting, Cuff Size: Large)   Pulse (!) 109   Temp 98 F (36.7 C) (Temporal)   Ht 5\' 7"  (1.702 m)   Wt 141 lb (64 kg)   LMP 06/22/2023 (Approximate)  SpO2 99%   BMI 22.08 kg/m  Wt Readings from Last 3 Encounters:  07/06/23 141 lb (64 kg)  05/20/23 140 lb (63.5 kg)  05/19/23 140 lb (63.5 kg)      Versa Gore, NP

## 2023-07-06 NOTE — Assessment & Plan Note (Signed)
 Hx of monthly B12 injections for 2 mos, then has been taking OTC daily pill.  - Recheck Vit B12 level today - Continue OTC Vit B12 qd - F/U in 1 year

## 2023-07-06 NOTE — Assessment & Plan Note (Signed)
 Anxiety potentially exacerbated by medication changes weaning off Lexapro. Currently on Paxil  30mg  qd, doing well. - Continue POC with psychiatry

## 2023-07-07 LAB — IRON,TIBC AND FERRITIN PANEL
%SAT: 39 % (ref 16–45)
Ferritin: 9 ng/mL — ABNORMAL LOW (ref 16–154)
Iron: 150 ug/dL (ref 40–190)
TIBC: 389 ug/dL (ref 250–450)

## 2023-07-07 LAB — VITAMIN B12: Vitamin B-12: 414 pg/mL (ref 211–911)

## 2023-07-07 LAB — VITAMIN D 25 HYDROXY (VIT D DEFICIENCY, FRACTURES): VITD: 24.73 ng/mL — ABNORMAL LOW (ref 30.00–100.00)

## 2023-07-08 ENCOUNTER — Telehealth: Payer: Self-pay | Admitting: Gastroenterology

## 2023-07-08 ENCOUNTER — Ambulatory Visit: Payer: MEDICAID | Admitting: Family

## 2023-07-08 NOTE — Telephone Encounter (Unsigned)
 Copied from CRM #[id number]. Topic: Access to Care - Medical Records Request  >> Jul 08, 2023 10:55 AM Jenna [name] wrote:  Patient is calling to request her immunization records for her job.     Writer did tell the patient this request can take 7-10 business days.  Writer did also tell the patient that because she is no longer a patient of [facility] there is no guarantee this request will be met.     Patient stated she needs to have the immunization record printed off today, 4.25.2025 as she has a deadline to turn the immunizations in to her employer.    Patient is requesting a call back to let her know when this is done at: [phone]

## 2023-07-11 ENCOUNTER — Encounter: Payer: Self-pay | Admitting: Family

## 2023-07-11 MED ORDER — SLOW RELEASE IRON 160 (50 FE) MG PO TBCR: 160.0000 mg | EXTENDED_RELEASE_TABLET | ORAL | 1 refills | Status: AC

## 2023-07-11 NOTE — Addendum Note (Signed)
 Addended by: Whittley Carandang on: 07/11/2023 08:14 AM   Modules accepted: Orders
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
## 2023-07-12 ENCOUNTER — Ambulatory Visit: Payer: MEDICAID | Attending: Sports Medicine

## 2023-07-12 DIAGNOSIS — M6281 Muscle weakness (generalized): Secondary | ICD-10-CM | POA: Insufficient documentation

## 2023-07-12 DIAGNOSIS — M4310 Spondylolisthesis, site unspecified: Secondary | ICD-10-CM | POA: Insufficient documentation

## 2023-07-12 DIAGNOSIS — R208 Other disturbances of skin sensation: Secondary | ICD-10-CM | POA: Insufficient documentation

## 2023-07-12 DIAGNOSIS — M5442 Lumbago with sciatica, left side: Secondary | ICD-10-CM | POA: Insufficient documentation

## 2023-07-12 DIAGNOSIS — G8929 Other chronic pain: Secondary | ICD-10-CM | POA: Insufficient documentation

## 2023-07-13 ENCOUNTER — Other Ambulatory Visit: Payer: Self-pay

## 2023-07-13 NOTE — Telephone Encounter (Signed)
 Copied from CRM [id number]. Topic: Information Request - Other  >> Jul 13, 2023 12:24 PM Jenna [name] wrote:  Patient calling in regards to her immunization request.    Transferred her to medical records as well.    Caller can be reached at [phone]

## 2023-07-20 ENCOUNTER — Ambulatory Visit: Payer: MEDICAID | Admitting: Physical Therapy

## 2023-07-21 ENCOUNTER — Ambulatory Visit: Payer: MEDICAID | Admitting: Physical Therapy

## 2023-07-27 ENCOUNTER — Ambulatory Visit: Payer: MEDICAID | Admitting: Physical Therapy

## 2023-07-28 ENCOUNTER — Ambulatory Visit: Payer: MEDICAID | Admitting: Physical Therapy
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
## 2023-08-01 ENCOUNTER — Ambulatory Visit: Payer: MEDICAID | Admitting: Physical Therapy

## 2023-08-02 ENCOUNTER — Ambulatory Visit: Payer: Self-pay | Admitting: Sleep Medicine

## 2023-08-05 ENCOUNTER — Ambulatory Visit: Payer: MEDICAID | Admitting: Physical Therapy

## 2023-08-09 ENCOUNTER — Ambulatory Visit: Payer: MEDICAID
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
## 2023-08-11 ENCOUNTER — Ambulatory Visit: Payer: MEDICAID

## 2023-08-15 ENCOUNTER — Encounter: Payer: Self-pay | Admitting: Family

## 2023-08-16 NOTE — Telephone Encounter (Signed)
 sorry to hear this, she could go back to [facility] preferably) if she needs to be seen before Friday.

## 2023-08-17 ENCOUNTER — Other Ambulatory Visit: Payer: Self-pay

## 2023-08-17 ENCOUNTER — Emergency Department
Admission: EM | Admit: 2023-08-17 | Discharge: 2023-08-17 | Discharge status: Against Medical Advice | Source: Ambulatory Visit

## 2023-08-17 MED ORDER — IBUPROFEN 600 MG PO TABS *I*
600.0000 mg | ORAL_TABLET | Freq: Once | ORAL | Status: DC
Start: 2023-08-17 — End: 2023-08-17

## 2023-08-17 MED ORDER — ACETAMINOPHEN 500 MG PO TABS *I*
ORAL_TABLET | ORAL | 0 refills | Status: DC
Start: 2023-08-17 — End: 2023-12-08
  Filled 2023-08-17: qty 30, 5d supply, fill #0

## 2023-08-17 MED ORDER — AMOXICILLIN 500 MG PO CAPS *I*
ORAL_CAPSULE | ORAL | 0 refills | Status: DC
Start: 2023-08-17 — End: 2023-12-08
  Filled 2023-08-17: qty 15, 5d supply, fill #0

## 2023-08-17 NOTE — ED Notes (Signed)
 Patient called and overhead paged multiple times with no response. Patient not visualized in ED waiting room.

## 2023-08-17 NOTE — ED Triage Notes (Signed)
 Right lower dental pain for 2 days. Denies fevers or chills. Has not taken anything for the pain.       Prehospital medications given: No

## 2023-08-18 ENCOUNTER — Encounter: Payer: Self-pay | Admitting: Family

## 2023-08-18 ENCOUNTER — Ambulatory Visit (INDEPENDENT_AMBULATORY_CARE_PROVIDER_SITE_OTHER): Payer: MEDICAID | Admitting: Family

## 2023-08-18 VITALS — BP 108/78 | HR 102 | Temp 98.4°F | Ht 67.0 in | Wt 152.6 lb

## 2023-08-18 DIAGNOSIS — N63 Unspecified lump in unspecified breast: Secondary | ICD-10-CM | POA: Diagnosis not present

## 2023-08-18 DIAGNOSIS — J029 Acute pharyngitis, unspecified: Secondary | ICD-10-CM | POA: Diagnosis not present

## 2023-08-18 DIAGNOSIS — H73893 Other specified disorders of tympanic membrane, bilateral: Secondary | ICD-10-CM | POA: Diagnosis not present

## 2023-08-18 DIAGNOSIS — H938X2 Other specified disorders of left ear: Secondary | ICD-10-CM | POA: Diagnosis not present

## 2023-08-18 LAB — POCT RAPID STREP A (OFFICE): Rapid Strep A Screen: NEGATIVE

## 2023-08-18 NOTE — Progress Notes (Signed)
 Patient ID: Samantha Nguyen, female    DOB: 09/22/1985, 38 y.o.   MRN: [id number]  Chief Complaint  Patient presents with   Sore Throat    Ear pain nothing she has tried is helping. Sore throat started itchy last night, and this morning she woke up it was.    Ear Pain  Discussed the use of AI scribe software for clinical note transcription with the patient, who gave verbal consent to proceed.  History of Present Illness Samantha Nguyen "Samantha Nguyen" is a 38 year old female who presents with ear pain/congestion and a sore throat.  She experiences intermittent left ear pain with a sensation of fullness, difficulty hearing, occasional popping, and a humming feeling. These symptoms have persisted since her last visit in April after having an infection. She uses Nasacort  and Allegra daily, with occasional Benadryl , but notes no significant improvement.  This morning, she developed a dry, painful sore throat, especially when swallowing, which prompted her to seek medical attention.  She also discovered a small, non-tender lump on the right side of her breast around her menstrual cycle, similar to previous lumps that become sore during her cycle. She has not had a mammogram before.  Assessment & Plan Chronic left ear congestion with possible Eustachian tube dysfunction Chronic left ear congestion persists with a dull, retracted tympanic membrane, suggesting Eustachian tube dysfunction. Discussed potential fluid behind the eardrum and tympanostomy tubes for relief, however, no discoloration noted today. - Refer to ENT for further evaluation. - Advise continued use of Nasacort  for other sinus symptoms. - Will continue to follow  Sore throat Sore throat likely viral or  possible increased sinus sx & post-nasal drip. Enlarged tonsils, no erythema. Rapid strep test negative. - Use Nasacort  qd to help with postnasal drip that may be contributing to soreness.  - Advise on warm salt water  gargles tid prn - Can  take up to 600mg  of Ibuprofen bid prn for pain - Call office if sx not improving by next week  Breast lump Palpable right breast lump at 10:00, non-tender suspected fibroadenoma. Further evaluation needed to rule out malignancy. - Order mammogram and breast ultrasound. - Schedule appointment at breast center for imaging.   Subjective:     Outpatient Medications Prior to Visit  Medication Sig Dispense Refill   cyanocobalamin  (VITAMIN B12) 1000 MCG tablet Take 1,000 mcg by mouth daily.     ferrous sulfate (SLOW RELEASE IRON ) 160 (50 Fe) MG TBCR SR tablet Take 1 tablet (160 mg total) by mouth 3 (three) times a week. Take M,W,F. 90 tablet 1   hydrOXYzine  (VISTARIL ) 25 MG capsule Take 25 mg by mouth as needed for anxiety.     LORazepam  (ATIVAN ) 1 MG tablet Take 1 tablet (1 mg total) by mouth daily as needed for anxiety (panic attacks). (Patient taking differently: Take 1 mg by mouth 3 (three) times daily as needed for anxiety (panic attacks). 0.5 mg) 4 tablet 0   OVER THE COUNTER MEDICATION allegra     PARoxetine  (PAXIL ) 30 MG tablet Take 30 mg by mouth daily.     triamcinolone  (NASACORT ) 55 MCG/ACT AERO nasal inhaler Place 1 spray into the nose daily. Start with 1 spray each side twice a day for 3 days, then reduce to daily. 1 each 2   No facility-administered medications prior to visit.   Past Medical History:  Diagnosis Date   Allergy [birth year]   Have had them my whole life   Anxiety    Anxiety about  health 05/18/2023   Chronic kidney disease    Costochondritis 09/02/2022   Depressed    History of kidney stones    MDD (major depressive disorder), recurrent episode, severe (HCC) 01/06/2023   MDD (major depressive disorder), single episode, severe (HCC) 04/28/2022   Migraine    Other chest pain 09/02/2022   Renal disorder    Tension-type headache, not intractable 07/27/2022   Past Surgical History:  Procedure Laterality Date   CESAREAN SECTION     CYSTOSCOPY W/ URETERAL STENT  PLACEMENT Right 08/24/2016   Procedure: CYSTOSCOPY WITH RETROGRADE PYELOGRAM/URETERAL STENT PLACEMENT;  Surgeon: Marco Severs, MD;  Location: [facility];  Service: Urology;  Laterality: Right;   CYSTOSCOPY WITH RETROGRADE PYELOGRAM, URETEROSCOPY AND STENT PLACEMENT Right 09/09/2016   Procedure: CYSTOSCOPY WITH RETROGRADE PYELOGRAM, URETEROSCOPY AND STENT REPLACEMENT;  Surgeon: Marco Severs, MD;  Location: [facility];  Service: Urology;  Laterality: Right;   CYSTOSCOPY WITH RETROGRADE PYELOGRAM, URETEROSCOPY AND STENT PLACEMENT Left 05/16/2023   Procedure: CYSTOURETEROSCOPY, WITH RETROGRADE PYELOGRAM AND STENT INSERTION;  Surgeon: Marco Severs, MD;  Location: [facility];  Service: Urology;  Laterality: Left;   HOLMIUM LASER APPLICATION Left 05/16/2023   Procedure: HOLMIUM LASER APPLICATION;  Surgeon: Marco Severs, MD;  Location: [facility];  Service: Urology;  Laterality: Left;   MANDIBLE SURGERY     Allergies  Allergen Reactions   Sulfa Antibiotics Other (See Comments)    Unknown childhood reaction      Objective:    Physical Exam Vitals and nursing note reviewed.  Constitutional:      Appearance: Normal appearance.  HENT:     Right Ear: Tympanic membrane is scarred and retracted.     Left Ear: Tympanic membrane is scarred and retracted.     Mouth/Throat:     Mouth: Mucous membranes are moist.     Pharynx: No pharyngeal swelling, oropharyngeal exudate, posterior oropharyngeal erythema, uvula swelling or postnasal drip.     Tonsils: 2+ on the right. 1+ on the left.  Cardiovascular:     Rate and Rhythm: Normal rate and regular rhythm.  Pulmonary:     Effort: Pulmonary effort is normal.     Breath sounds: Normal breath sounds.  Chest:  Breasts:    Right: No swelling.    Musculoskeletal:        General: Normal range of motion.  Skin:    General: Skin is warm and dry.  Neurological:     Mental Status: She is alert.  Psychiatric:        Mood and  Affect: Mood normal.        Behavior: Behavior normal.    BP 108/78   Pulse (!) 102   Temp 98.4 F (36.9 C) (Temporal)   Ht 5\' 7"  (1.702 m)   Wt 152 lb 9.6 oz (69.2 kg)   SpO2 98%   BMI 23.90 kg/m  Wt Readings from Last 3 Encounters:  08/18/23 152 lb 9.6 oz (69.2 kg)  07/06/23 141 lb (64 kg)  05/20/23 140 lb (63.5 kg)      Versa Gore, NP

## 2023-08-18 NOTE — Assessment & Plan Note (Signed)
 Chronic left ear congestion persists with a dull, retracted tympanic membrane, suggesting Eustachian tube dysfunction. Discussed potential fluid behind the eardrum and tympanostomy tubes for relief, however, no discoloration noted today. - Refer to ENT for further evaluation. - Advise continued use of Nasacort  for other sinus symptoms. - Will continue to follow

## 2023-08-19 ENCOUNTER — Ambulatory Visit: Payer: MEDICAID | Admitting: Family

## 2023-08-20 ENCOUNTER — Other Ambulatory Visit: Payer: Self-pay

## 2023-08-20 ENCOUNTER — Emergency Department
Admission: EM | Admit: 2023-08-20 | Discharge: 2023-08-20 | Disposition: A | Discharge status: Home / Self Care | Source: Ambulatory Visit | Attending: Emergency Medicine | Admitting: Emergency Medicine

## 2023-08-20 ENCOUNTER — Emergency Department

## 2023-08-20 ENCOUNTER — Encounter: Payer: Self-pay | Admitting: Emergency Medicine

## 2023-08-20 DIAGNOSIS — R918 Other nonspecific abnormal finding of lung field: Secondary | ICD-10-CM

## 2023-08-20 DIAGNOSIS — R0902 Hypoxemia: Secondary | ICD-10-CM | POA: Insufficient documentation

## 2023-08-20 DIAGNOSIS — F17210 Nicotine dependence, cigarettes, uncomplicated: Secondary | ICD-10-CM | POA: Insufficient documentation

## 2023-08-20 DIAGNOSIS — I5030 Unspecified diastolic (congestive) heart failure: Secondary | ICD-10-CM

## 2023-08-20 DIAGNOSIS — J810 Acute pulmonary edema: Secondary | ICD-10-CM | POA: Insufficient documentation

## 2023-08-20 DIAGNOSIS — I517 Cardiomegaly: Secondary | ICD-10-CM | POA: Insufficient documentation

## 2023-08-20 DIAGNOSIS — J4550 Severe persistent asthma, uncomplicated: Secondary | ICD-10-CM | POA: Insufficient documentation

## 2023-08-20 DIAGNOSIS — Z789 Other specified health status: Secondary | ICD-10-CM

## 2023-08-20 DIAGNOSIS — Z1152 Encounter for screening for COVID-19: Secondary | ICD-10-CM | POA: Insufficient documentation

## 2023-08-20 DIAGNOSIS — I110 Hypertensive heart disease with heart failure: Secondary | ICD-10-CM

## 2023-08-20 DIAGNOSIS — Z1159 Encounter for screening for other viral diseases: Secondary | ICD-10-CM | POA: Insufficient documentation

## 2023-08-20 LAB — RUQ PANEL (ED ONLY)
ALT: 21 U/L (ref 0–35)
AST: 21 U/L (ref 0–35)
Albumin: 3.9 g/dL (ref 3.5–5.2)
Alk Phos: 105 U/L (ref 35–105)
Amylase: 46 U/L (ref 28–100)
Bilirubin,Direct: <0.2 mg/dL (ref 0.0–0.3)
Bilirubin,Total: 0.2 mg/dL (ref 0.0–1.2)
Lipase: 22 U/L (ref 13–60)
Total Protein: 6.7 g/dL (ref 6.3–7.7)

## 2023-08-20 LAB — CBC AND DIFFERENTIAL
Baso # K/uL: 0 10*3/uL (ref 0.0–0.2)
Eos # K/uL: 0.1 10*3/uL (ref 0.0–0.5)
Hematocrit: 26 % — ABNORMAL LOW (ref 34–49)
Hemoglobin: 7 g/dL — ABNORMAL LOW (ref 11.2–16.0)
IMM Granulocytes #: 0 10*3/uL (ref 0.0–0.0)
IMM Granulocytes: 0.3 %
Lymph # K/uL: 1.9 10*3/uL (ref 1.0–5.0)
MCV: 76 fL (ref 75–100)
Mono # K/uL: 0.9 10*3/uL (ref 0.1–1.0)
Neut # K/uL: 9 10*3/uL — ABNORMAL HIGH (ref 1.5–6.5)
Nucl RBC # K/uL: 0 10*3/uL (ref 0.0–0.0)
Nucl RBC %: 0.2 /100{WBCs} (ref 0.0–0.2)
Platelets: 348 10*3/uL (ref 150–450)
RBC: 3.5 MIL/uL — ABNORMAL LOW (ref 4.0–5.5)
RDW: 18.1 % — ABNORMAL HIGH (ref 0.0–15.0)
Seg Neut %: 75.4 %
WBC: 12 10*3/uL — ABNORMAL HIGH (ref 3.5–11.0)

## 2023-08-20 LAB — BASIC METABOLIC PANEL
Anion Gap: 13 (ref 7–16)
CO2: 27 mmol/L (ref 20–28)
Calcium: 9.4 mg/dL (ref 8.8–10.2)
Chloride: 102 mmol/L (ref 96–108)
Creatinine: 0.95 mg/dL (ref 0.51–0.95)
Glucose: 99 mg/dL (ref 60–99)
Lab: 12 mg/dL (ref 6–20)
Potassium: 4.3 mmol/L (ref 3.3–5.1)
Sodium: 142 mmol/L (ref 133–145)
eGFR BY CREAT: 79 *

## 2023-08-20 LAB — PERFORMING LAB

## 2023-08-20 LAB — COVID/INFLUENZA A & B/RSV NAAT (PCR)
COVID-19 NAAT (PCR): NEGATIVE
Influenza A NAAT (PCR): NEGATIVE
Influenza B NAAT (PCR): NEGATIVE
RSV NAAT (PCR): NEGATIVE

## 2023-08-20 LAB — TROPONIN T 3 HR W/ DELTA HIGH SENSITIVITY (IP/ED ONLY)
TROP T 0-3 HR DELTA High Sensitivity: -1 — ABNORMAL LOW (ref 0–4)
TROP T 3 HR High Sensitivity: 13 ng/L (ref 0–14)

## 2023-08-20 LAB — BLOOD BANK HOLD PINK

## 2023-08-20 LAB — HOLD GRAY

## 2023-08-20 LAB — TROPONIN T 1 HR W/ DELTA HIGH SENSITIVITY
TROP T 0-1 HR DELTA High Sensitivity: 3 — ABNORMAL HIGH (ref 0–2)
TROP T 1 HR High Sensitivity: 17 ng/L — ABNORMAL HIGH (ref 0–11)

## 2023-08-20 LAB — PREGNANCY TEST, SERUM: Preg,Serum: NEGATIVE

## 2023-08-20 LAB — TROPONIN T 0 HR HIGH SENSITIVITY (IP/ED ONLY): TROP T 0 HR High Sensitivity: 14 ng/L — ABNORMAL HIGH (ref 0–11)

## 2023-08-20 LAB — NT-PRO BNP: NT-pro BNP: 615 pg/mL — ABNORMAL HIGH (ref 0–450)

## 2023-08-20 LAB — HOLD BLUE

## 2023-08-20 MED ORDER — IBUPROFEN 600 MG PO TABS *I*
600.0000 mg | ORAL_TABLET | Freq: Three times a day (TID) | ORAL | 0 refills | Status: DC | PRN
Start: 2023-08-20 — End: 2023-12-09

## 2023-08-20 MED ORDER — FUROSEMIDE 10 MG/ML IJ SOLN *I*
40.0000 mg | Freq: Once | INTRAMUSCULAR | Status: AC
Start: 2023-08-20 — End: 2023-08-20
  Administered 2023-08-20: 40 mg via INTRAVENOUS
  Filled 2023-08-20: qty 4

## 2023-08-20 MED ORDER — ACETAMINOPHEN 325 MG PO TABS *I*
650.0000 mg | ORAL_TABLET | Freq: Once | ORAL | Status: AC
Start: 2023-08-20 — End: 2023-08-20
  Administered 2023-08-20: 650 mg via ORAL
  Filled 2023-08-20: qty 2

## 2023-08-20 MED ORDER — DEXTROSE 5 % FLUSH FOR PUMPS *I*
0.0000 mL/h | INTRAVENOUS | Status: DC | PRN
Start: 2023-08-20 — End: 2023-08-21

## 2023-08-20 MED ORDER — ALBUTEROL SULFATE (2.5 MG/3ML) 0.083% IN NEBU *I*
2.5000 mg | INHALATION_SOLUTION | Freq: Once | RESPIRATORY_TRACT | Status: AC
Start: 2023-08-20 — End: 2023-08-20
  Administered 2023-08-20: 2.5 mg via RESPIRATORY_TRACT
  Filled 2023-08-20: qty 3

## 2023-08-20 MED ORDER — SODIUM CHLORIDE 0.9 % FLUSH FOR PUMPS *I*
0.0000 mL/h | INTRAVENOUS | Status: DC | PRN
Start: 2023-08-20 — End: 2023-10-19

## 2023-08-20 MED ORDER — TORSEMIDE 5 MG PO TABS *I*
5.0000 mg | ORAL_TABLET | Freq: Every day | ORAL | 0 refills | Status: DC
Start: 2023-08-20 — End: 2023-12-09

## 2023-08-20 MED ORDER — BUDESONIDE-FORMOTEROL FUMARATE 160-4.5 MCG/ACT IN AERO *I*
2.0000 | INHALATION_SPRAY | Freq: Two times a day (BID) | RESPIRATORY_TRACT | 5 refills | Status: DC
Start: 2023-08-20 — End: 2023-12-09

## 2023-08-20 NOTE — ED Triage Notes (Signed)
 Endorsing SOB and CP for the last few days. Arrives on 4L, RA at baseline. States she's been taking all her meds. EKG in triage. Hx COPD & CHF.     Prehospital medications given: No
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
## 2023-08-20 NOTE — Discharge Instructions (Addendum)
 You were seen in the Emergency Department today for shortness of breath.  You had blood work, electrocardiogram and imaging which suggested fluid in your lungs (pulmonary edema) were cause of symptoms.  You were given a diuretic to help you remove fluids, which helped her symptoms significantly.  You were offered admission, but preferred to go home.  A referral was sent to cardiology, please follow-up with them as soon as possible.  You were started on low-dose torsemide to help prevent recurrence of symptoms.    If you experience new or concerning symptoms, including severe vomiting, fevers that do not respond to Tylenol , worsening of your difficulty breathing, or significant worsening of your chest pain, please immediately seek care with a health care professional or return to the emergency department.

## 2023-08-20 NOTE — ED Notes (Signed)
 Reviewed AVS with patient. No questions or concerns voiced. Verbalized understanding of all.     Patient dressed appropriately for the weather and discharged without issue.     Patient is being discharged with all belongings in her possession.    IV removed.     Ambulatory, A&Ox4, and VSS.     Accompanied by: spouse  Mode of transportation: ambulatory to medical transport - medicaid cab, discharge to home       Signed by: Judy Null, RN as of 08/20/2023 at 7:32 PM

## 2023-08-20 NOTE — ED Provider Progress Notes (Addendum)
 I received this patient in signout.  Briefly, Jenna [name] is a 38 y.o. female with PMHx COPD, CHF here with chest pain and SOB. Not compliant with medications recently. CXR concerning for pulmonary edema. Chronic anemia, notes heavy menstrual period.     Labs & Imaging highlights:  - Labs are generally reassuring.  She is anemic, but similar to her baseline.    Reassessed the patient, chest x-ray is concerning for pulmonary edema.  Administered IV diuretic.  She also has some faint expiratory wheezing.  Ordered albuterol  treatment as well.  She is significant improvement after diuretics.  She is not ambulatory without significant shortness of breath.  Discussed treatment plans, recommended that she stay for hospital admission for cardiology evaluation initiation of diuretics.  Patient would strongly prefer to go home.  She understands these risks.  Will start her on low-dose torsemide with referral to cardiology for close follow-up.    Disposition:  - I reviewed test results, and discussed treatment options with the patient. The patient verbalizes agreement and understanding of the treatment plan. Reviewed return precautions with the patient. The patient was discharged home .        Omie Bickers, DO  Resident  08/20/23 [phone]

## 2023-08-21 LAB — HOLD GREEN NO GEL

## 2023-08-24 ENCOUNTER — Telehealth: Payer: Self-pay

## 2023-08-24 ENCOUNTER — Ambulatory Visit
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
## 2023-08-24 NOTE — Telephone Encounter (Signed)
 No Show for Appt. On 08/24/23. Spoke to fiance and gave him our number to call and reschedule.

## 2023-08-24 NOTE — Telephone Encounter (Signed)
 No show letter sent 08/24/23

## 2023-08-25 LAB — EKG 12-LEAD
P: 73 deg
PR: 148 ms
QRS: 78 deg
QRSD: 92 ms
QT: 376 ms
QTc: 457 ms
Rate: 89 {beats}/min
T: 83 deg

## 2023-08-31 ENCOUNTER — Encounter (INDEPENDENT_AMBULATORY_CARE_PROVIDER_SITE_OTHER): Payer: Self-pay

## 2023-09-02 ENCOUNTER — Inpatient Hospital Stay: Admission: RE | Admit: 2023-09-02 | Payer: MEDICAID | Source: Ambulatory Visit

## 2023-09-12 ENCOUNTER — Emergency Department
Admission: EM | Admit: 2023-09-12 | Discharge: 2023-09-12 | Disposition: A | Discharge status: Home / Self Care | Source: Ambulatory Visit | Attending: Family Medicine | Admitting: Family Medicine

## 2023-09-12 ENCOUNTER — Other Ambulatory Visit: Payer: Self-pay

## 2023-09-12 ENCOUNTER — Emergency Department

## 2023-09-12 DIAGNOSIS — R9431 Abnormal electrocardiogram [ECG] [EKG]: Secondary | ICD-10-CM

## 2023-09-12 DIAGNOSIS — F17210 Nicotine dependence, cigarettes, uncomplicated: Secondary | ICD-10-CM | POA: Insufficient documentation

## 2023-09-12 DIAGNOSIS — Z789 Other specified health status: Secondary | ICD-10-CM

## 2023-09-12 DIAGNOSIS — J449 Chronic obstructive pulmonary disease, unspecified: Secondary | ICD-10-CM

## 2023-09-12 DIAGNOSIS — R7989 Other specified abnormal findings of blood chemistry: Secondary | ICD-10-CM

## 2023-09-12 DIAGNOSIS — I517 Cardiomegaly: Secondary | ICD-10-CM

## 2023-09-12 DIAGNOSIS — R0602 Shortness of breath: Secondary | ICD-10-CM

## 2023-09-12 DIAGNOSIS — R609 Edema, unspecified: Secondary | ICD-10-CM | POA: Insufficient documentation

## 2023-09-12 DIAGNOSIS — R918 Other nonspecific abnormal finding of lung field: Secondary | ICD-10-CM

## 2023-09-12 DIAGNOSIS — J811 Chronic pulmonary edema: Secondary | ICD-10-CM

## 2023-09-12 DIAGNOSIS — D649 Anemia, unspecified: Principal | ICD-10-CM | POA: Insufficient documentation

## 2023-09-12 DIAGNOSIS — I10 Essential (primary) hypertension: Secondary | ICD-10-CM

## 2023-09-12 LAB — CBC AND DIFFERENTIAL
Baso # K/uL: 0 10*3/uL (ref 0.0–0.2)
Eos # K/uL: 0.2 10*3/uL (ref 0.0–0.5)
Hematocrit: 26 % — ABNORMAL LOW (ref 34–49)
Hemoglobin: 6.4 g/dL — ABNORMAL LOW (ref 11.2–16.0)
IMM Granulocytes #: 0.1 10*3/uL — ABNORMAL HIGH
IMM Granulocytes: 0.6 %
Lymph # K/uL: 2.3 10*3/uL (ref 1.0–5.0)
MCV: 80 fL (ref 75–100)
Mono # K/uL: 1 10*3/uL (ref 0.1–1.0)
Neut # K/uL: 10.6 10*3/uL — ABNORMAL HIGH (ref 1.5–6.5)
Nucl RBC # K/uL: 0.1 10*3/uL (ref 0.0–0.1)
Nucl RBC %: 0.4 /100{WBCs} — ABNORMAL HIGH (ref 0.0–0.2)
Platelets: 378 10*3/uL (ref 150–450)
RBC: 3.3 MIL/uL — ABNORMAL LOW (ref 4.0–5.5)
RDW: 18.6 % — ABNORMAL HIGH (ref 0.0–15.0)
Seg Neut %: 74.4 %
WBC: 14.2 10*3/uL — ABNORMAL HIGH (ref 3.5–11.0)

## 2023-09-12 LAB — NT-PRO BNP: NT-pro BNP: 360 pg/mL (ref 0–450)

## 2023-09-12 LAB — TYPE AND SCREEN
ABO RH Blood Type: A POS
Antibody Screen: NEGATIVE

## 2023-09-12 LAB — RUQ PANEL (ED ONLY)
ALT: 21 U/L (ref 0–35)
AST: 25 U/L (ref 0–35)
Albumin: 3.8 g/dL (ref 3.5–5.2)
Alk Phos: 109 U/L — ABNORMAL HIGH (ref 35–105)
Amylase: 46 U/L (ref 28–100)
Bilirubin,Direct: <0.2 mg/dL (ref 0.0–0.3)
Bilirubin,Total: <0.2 mg/dL (ref 0.0–1.2)
Lipase: 35 U/L (ref 13–60)
Total Protein: 6.6 g/dL (ref 6.3–7.7)

## 2023-09-12 LAB — EKG 12-LEAD
P: 69 deg
PR: 148 ms
QRS: 11 deg
QRSD: 95 ms
QT: 385 ms
QTc: 456 ms
Rate: 84 {beats}/min
T: -8 deg

## 2023-09-12 LAB — RED BLOOD CELLS
Component blood type: A POS
Dispense status: TRANSFUSED

## 2023-09-12 LAB — PREGNANCY TEST, SERUM: Preg,Serum: NEGATIVE

## 2023-09-12 LAB — BASIC METABOLIC PANEL
Anion Gap: 14 (ref 7–16)
CO2: 21 mmol/L (ref 20–28)
Calcium: 9.1 mg/dL (ref 8.8–10.2)
Chloride: 107 mmol/L (ref 96–108)
Creatinine: 0.88 mg/dL (ref 0.51–0.95)
Glucose: 129 mg/dL — ABNORMAL HIGH (ref 60–99)
Lab: 15 mg/dL (ref 6–20)
Potassium: 4.4 mmol/L (ref 3.3–5.1)
Sodium: 142 mmol/L (ref 133–145)
eGFR BY CREAT: 86

## 2023-09-12 LAB — TROPONIN T 3 HR W/ DELTA HIGH SENSITIVITY (IP/ED ONLY)
TROP T 0-3 HR DELTA High Sensitivity: -1 — ABNORMAL LOW (ref 0–4)
TROP T 3 HR High Sensitivity: 15 ng/L — ABNORMAL HIGH (ref 0–14)

## 2023-09-12 LAB — TROPONIN T 1 HR W/ DELTA HIGH SENSITIVITY
TROP T 0-1 HR DELTA High Sensitivity: -1 — ABNORMAL LOW (ref 0–2)
TROP T 1 HR High Sensitivity: 15 ng/L — ABNORMAL HIGH (ref 0–11)

## 2023-09-12 LAB — TROPONIN T 0 HR HIGH SENSITIVITY (IP/ED ONLY): TROP T 0 HR High Sensitivity: 16 ng/L — ABNORMAL HIGH (ref 0–11)

## 2023-09-12 LAB — D-DIMER, QUANTITATIVE: D-Dimer: 0.92 ug{FEU}/mL — ABNORMAL HIGH (ref 0.00–0.50)

## 2023-09-12 MED ORDER — ONDANSETRON HCL 2 MG/ML IV SOLN *I*
4.0000 mg | Freq: Once | INTRAMUSCULAR | Status: AC
Start: 2023-09-12 — End: 2023-09-12
  Administered 2023-09-12: 4 mg via INTRAVENOUS
  Filled 2023-09-12: qty 2

## 2023-09-12 MED ORDER — ACETAMINOPHEN 500 MG PO TABS *I*
1000.0000 mg | ORAL_TABLET | Freq: Once | ORAL | Status: AC
Start: 2023-09-12 — End: 2023-09-12
  Administered 2023-09-12: 1000 mg via ORAL
  Filled 2023-09-12: qty 2

## 2023-09-12 MED ORDER — FUROSEMIDE 10 MG/ML IJ SOLN *I*
20.0000 mg | Freq: Once | INTRAMUSCULAR | Status: AC
Start: 2023-09-12 — End: 2023-09-12
  Administered 2023-09-12: 20 mg via INTRAVENOUS
  Filled 2023-09-12: qty 2

## 2023-09-12 MED ORDER — IOHEXOL 350 MG/ML (OMNIPAQUE) IV SOLN 500ML BOTTLE *I*
1.0000 mL | Freq: Once | INTRAVENOUS | Status: AC
Start: 2023-09-12 — End: 2023-09-12
  Administered 2023-09-12: 70 mL via INTRAVENOUS

## 2023-09-12 MED ORDER — SODIUM CHLORIDE 0.9 % IV SOLN WRAPPED *I*
3.0000 mL/h | Status: DC
Start: 2023-09-12 — End: 2023-09-13
  Administered 2023-09-12: 3 mL/h via INTRAVENOUS

## 2023-09-12 NOTE — ED Notes (Signed)
Assumed care of patient. Patient is resting comfortably in bed. No acute distress or complaints at this time.

## 2023-09-12 NOTE — ED Notes (Signed)
 Report Given To  Damien, RN      Descriptive Sentence / Reason for Admission   Patient endorsing CP and SOB that started 2x days ago with n/v. Has not gotten better so called EMS.       Active Issues / Relevant Events   Full code   AxoX4, ambulatory, continent  Trop 16-->15->15  WBC 14.2, H/H 6.4/26  Chest xray-No pneumothorax. Mild pulmonary edema.   1 unit PRBC      To Do List  VS/A  Meds per [facility]         Anticipatory Guidance / Discharge Planning

## 2023-09-12 NOTE — ED Notes (Signed)
 Pt left before VS were taken and AVS was given, pt was made aware of the risks of leaving AMA but stated she had to take care of things going on at home then she will come back. Pt was made aware that she would have to start the process over again. She understood and just wanted diuretics to go, provider made aware.

## 2023-09-12 NOTE — ED Provider Progress Notes (Signed)
 ED Provider Progress Note  Received patient in signout pending D-dimer.  Patient receiving a unit of PRBCs.  D-dimer was elevated so CTA chest was obtained showing no pulmonary embolism.  Unclear significance of right middle lobe consolidative change.  Patient has had no cough.  Patient discussed with OBS who will admit.          MARTY LITTIE BEAL, MD, 09/12/2023, 8:55 PM     BEAL MARTY LITTIE, MD  09/12/23 2056

## 2023-09-12 NOTE — ED Provider Progress Notes (Signed)
 ED Provider Progress Note  Patient was admitted to the observation unit.  She then stated that she had things to take care of and did not want to stay.  Discussed need to follow blood count and further diuresis.  Patient still did not want to stay.  Will discharge.  Follow-up PCP.          MARTY LITTIE BEAL, MD, 09/12/2023, 9:14 PM     BEAL MARTY LITTIE, MD  09/12/23 2115
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
## 2023-09-12 NOTE — ED Triage Notes (Signed)
 Patient endorsing CP and SOB that started 2x days ago with n/v. Has not gotten better so called EMS.

## 2023-09-12 NOTE — ED Notes (Signed)
 1 unit PRBC started on patient. Patient has 2 points of access, consent in the chart. Patient demonstrated understanding on symptoms of transfusion reaction. 2 RN sign off and verification of blood product. Blood started at 54mL/hr for the first 15 minutes

## 2023-09-12 NOTE — Discharge Instructions (Signed)
 Your doctor for an appointment.

## 2023-09-14 ENCOUNTER — Ambulatory Visit: Payer: MEDICAID | Admitting: Clinical

## 2023-09-23 ENCOUNTER — Ambulatory Visit
Admission: RE | Admit: 2023-09-23 | Discharge: 2023-09-23 | Disposition: A | Discharge status: Home / Self Care | Payer: MEDICAID | Source: Ambulatory Visit | Attending: Family | Admitting: Family

## 2023-09-23 ENCOUNTER — Other Ambulatory Visit: Payer: Self-pay | Admitting: Family

## 2023-09-23 ENCOUNTER — Telehealth (INDEPENDENT_AMBULATORY_CARE_PROVIDER_SITE_OTHER): Payer: Self-pay | Admitting: Physician Assistant

## 2023-09-23 DIAGNOSIS — N63 Unspecified lump in unspecified breast: Secondary | ICD-10-CM

## 2023-09-23 DIAGNOSIS — N6320 Unspecified lump in the left breast, unspecified quadrant: Secondary | ICD-10-CM

## 2023-09-23 DIAGNOSIS — N6310 Unspecified lump in the right breast, unspecified quadrant: Secondary | ICD-10-CM

## 2023-09-23 NOTE — Telephone Encounter (Signed)
 Tried to call to reschedule appointments (10/25/2023)  Provider not in office.  NA NVM

## 2023-09-27 ENCOUNTER — Ambulatory Visit
Admission: RE | Admit: 2023-09-27 | Discharge: 2023-09-27 | Disposition: A | Discharge status: Home / Self Care | Payer: MEDICAID | Source: Ambulatory Visit | Attending: Family | Admitting: Family

## 2023-09-27 ENCOUNTER — Inpatient Hospital Stay
Admission: RE | Admit: 2023-09-27 | Discharge: 2023-09-27 | Discharge status: Home / Self Care | Payer: MEDICAID | Source: Ambulatory Visit | Attending: Family | Admitting: Family

## 2023-09-27 DIAGNOSIS — N6320 Unspecified lump in the left breast, unspecified quadrant: Secondary | ICD-10-CM

## 2023-09-27 DIAGNOSIS — N6310 Unspecified lump in the right breast, unspecified quadrant: Secondary | ICD-10-CM

## 2023-09-27 HISTORY — PX: BREAST BIOPSY: SHX20

## 2023-09-28 LAB — SURGICAL PATHOLOGY

## 2023-10-11 ENCOUNTER — Ambulatory Visit (INDEPENDENT_AMBULATORY_CARE_PROVIDER_SITE_OTHER): Payer: MEDICAID | Admitting: Clinical

## 2023-10-11 DIAGNOSIS — Z0389 Encounter for observation for other suspected diseases and conditions ruled out: Secondary | ICD-10-CM

## 2023-10-11 NOTE — Progress Notes (Signed)
 [facility] Counselor/Therapist Progress Note  Patient ID: Samantha Nguyen, MRN: [id number]    Date: 10/11/23  Time Spent: 1:33  pm - 1:53 pm : 20 Minutes  Treatment Type: Individual Therapy.  Reported Symptoms: NA  Mental Status Exam: Appearance:  Neat and Well Groomed     Behavior: Appropriate  Motor: Normal  Speech/Language:  Clear and Coherent and Normal Rate  Affect: Appropriate  Mood: Could not assess  Thought process: normal  Thought content:   WNL  Sensory/Perceptual disturbances:   Could not assess  Orientation: oriented to person, place, time/date, and situation  Attention: Good  Concentration: Good  Memory: WNL  Fund of knowledge:  Good  Insight:   Good  Judgment:  Good  Impulse Control: Good   Risk Assessment: Danger to Self:  could not assess Self-injurious Behavior: could not assess Danger to Others: could not assess Duty to Warn:could not assess Physical Aggression / Violence:could not assess Access to Firearms a concern: could not assess Gang Involvement:could not assess  Subjective:  Patient is a 38 year old female who presented for an initial assessment. Patient reported patient was previously participating in therapy and would like to re-engage in therapy. At the beginning of today's appointment patient disclosed a conflict of interest to clinician. Patient reported a history of anxiety.   Interventions: task centered. Clinician conducted session in person at clinician's office at [facility]. Clinician discussed referring patient to another clinician due to conflict of interest. Clinician facilitated phone call with administrative staff to  schedule patient with another clinician. Discussed with patient resources for patient to utilize in the event of a mental health emergency, such as, calling 911, calling [phone]. going to a local emergency room, [facility] 24 hours [facility], [facility] 24 hour behavioral health urgent  care.   Diagnosis:  No diagnosis on Axis I  Collaboration of Care: Clinician facilitated phone call with administrative staff to schedule patient with another clinician.  Patient/Guardian was advised Release of Information must be obtained prior to any record release in order to collaborate their care with an outside provider.    Plan: Clinician facilitated phone call with administrative staff to  schedule patient with another clinician and patient was scheduled with another clinician.

## 2023-10-11 NOTE — Progress Notes (Signed)
   Doree Barthel, LCSW

## 2023-10-12 ENCOUNTER — Telehealth (INDEPENDENT_AMBULATORY_CARE_PROVIDER_SITE_OTHER): Payer: Self-pay | Admitting: Physician Assistant

## 2023-10-12 ENCOUNTER — Encounter (INDEPENDENT_AMBULATORY_CARE_PROVIDER_SITE_OTHER): Payer: Self-pay

## 2023-10-12 NOTE — Telephone Encounter (Signed)
 Tried to call to reschedule 10/25/2023 appointment.  NA NVM.  Sent MyChart message.

## 2023-10-25 ENCOUNTER — Institutional Professional Consult (permissible substitution) (INDEPENDENT_AMBULATORY_CARE_PROVIDER_SITE_OTHER): Payer: MEDICAID | Admitting: Physician Assistant

## 2023-10-25 ENCOUNTER — Ambulatory Visit (INDEPENDENT_AMBULATORY_CARE_PROVIDER_SITE_OTHER): Payer: MEDICAID | Admitting: Audiology

## 2023-10-28 ENCOUNTER — Ambulatory Visit: Payer: MEDICAID | Admitting: Clinical

## 2023-10-28 DIAGNOSIS — F4322 Adjustment disorder with anxiety: Secondary | ICD-10-CM

## 2023-10-28 NOTE — Progress Notes (Deleted)
 ADULT Comprehensive Clinical Assessment (CCA) Note   10/28/2023 Samantha Nguyen [id number]   Referring Provider: *** Session Start time: No data recorded   Session End time: No data recorded Total time in minutes: No data recorded   SUBJECTIVE: Samantha Nguyen is a 38 y.o.   female accompanied by {CHL AMB ACCOMPANIED AB:[id number]}  Samantha Nguyen was seen in consultation at the request of Lucius Krabbe, NP for evaluation of {CHL AMB PED BEHAVIORAL LEARNING PROBLEMS:210130101}.  Types of Service: {CHL AMB TYPE OF [phone]}  Reason for referral in patient/family's own words:  ***    She likes to be called ***.  She came to the appointment with {CHL AMB ACCOMPANIED AB:[id number]}.  Primary language at home is {CHL AMB BASIC LANGUAGE SPOKEN:[phone]}  Constitutional Appearance: {CHL AMB PED CONSTITUTIONAL:210130113}, well-nourished, well-developed, alert and well-appearing  (Patient to answer as appropriate) Gender identity: *** Sex assigned at birth: *** Pronouns: {he/she/they:23295}   Mental status exam:   General Appearance /Behavior:  {BHH GENERALAPPEARANCE/BEHAVIOR:22300} Eye Contact:  {BHH EYE CONTACT:22301} Motor Behavior:  {BHH MOTOR BEHAVIOR:22302} Speech:  {BHH SPEECH:22304} Level of Consciousness:  {BHH LEVEL OF CONSCIOUSNESS:22305} Mood:  {BHH MOOD:22306} Affect:  {BHH AFFECT:22307} Anxiety Level:  {BHH ANXIETY LEVEL:22308} Thought Process:  {BHH THOUGHT PROCESS:22309} Thought Content:  {BHH THOUGHT CONTENT:22310} Perception:  {BHH PERCEPTION:22311} Judgment:  {BHH JUDGMENT:22312} Insight:  {BHH INSIGHT:22313}   Current Medications and therapies: She is taking:  {CHL AMB TAKING MEDICATIONS:220130102}   Therapies:  {CHL AMB THERAPIES:5018301085}  Family history: Family mental illness:  {CHL AMB FAMILY MENTAL ILLNESS:[phone]} Family school achievement history:  {CHL AMB FAMILY SCHOOL ACHIEVEMENT [phone]} Other relevant  family history:  {CHL AMB OTHER RELEVANT FAMILY HISTORY:210130114}  Social History: Now living with {CHL AMB LIVING TPUY:7898698971}. {CHL AMB PED PARENT/GUARDIAN RELATIONS:210130115}. Employment:  {CHL AMB PARENT/GUARDIAN [phone]} Main caregiver's health:  {CHL AMB CAREGIVER HEALTH:[phone]} Religious or Spiritual Beliefs: ***  Mood: She {CHL AMB PARENTS MOOD CONCERNS:[phone]}. {CHL AMB MOOD:[phone]}  Negative Mood Concerns {CHL AMB NEGATIVE THOUGHTS:210130169}. Self-injury:  {CHL AMB DID NOT JDX:789869825} Suicidal ideation:  {CHL AMB DID NOT JDX:789869825} Suicide attempt:  {CHL AMB DID NOT JDX:789869825}  Additional Anxiety Concerns: Panic attacks:  {CHL AMB YES/NO/NOT APPLICABLE:210130111} Obsessions:  {CHL AMB YES/NO/NOT APPLICABLE:210130111} Compulsions:  {CHL AMB YES/NO/NOT APPLICABLE:210130111}  Stressors:  {CHL AMB BH [phone]}  Alcohol and/or Substance Use: Have you recently consumed alcohol? {YES/NO/WILD RJMID:81418}  Have you recently used any drugs?  {YES/NO/WILD RJMID:81418}  Have you recently consumed any tobacco? {YES/NO/WILD CARDS:18581} Does patient seem concerned about dependence or abuse of any substance? {YES/NO/WILD RJMID:81418}  Substance Use Disorder Checklist:  {CHL AMB BH CHECKLIST FOR SUBSTANCE USE [phone]}  Severity Risk Scoring based on DSM-5 Criteria for Substance Use Disorder. The presence of at least two (2) criteria in the last 12 months indicate a substance use disorder. The severity of the substance use disorder is defined as:  Mild: Presence of 2-3 criteria Moderate: Presence of 4-5 criteria Severe: Presence of 6 or more criteria  Traumatic Experiences: History or current traumatic events (natural disaster, house fire, etc.)? {YES/NO/WILD RJMID:81418} History or current physical trauma?  {YES/NO/WILD RJMID:81418} History or current emotional trauma?  {YES/NO/WILD RJMID:81418} History or  current sexual trauma?  {YES/NO/WILD RJMID:81418} History or current domestic or intimate partner violence?  {YES/NO/WILD RJMID:81418} History of bullying:  {YES/NO/WILD CARDS:18581}  Risk Assessment: Suicidal or homicidal thoughts?   {YES/NO/WILD RJMID:79211} Self injurious behaviors?  {YES/NO/WILD RJMID:79211} Guns in the home?  {YES/NO/WILD RJMID:79211}  Self Harm Risk Factors: {CHL  AMB BH SELF HARM RISK FACTORS:[phone]}  Self Harm Thoughts?: {CHL AMB BH SELF HARM [phone]}  Patient and/or Family's Strengths/Protective Factors: {CHL AMB BH PROTECTIVE [phone]}  Patient's and/or Family's Goals in their own words: ***  Interventions: Interventions utilized:  {IBH Interventions:21014054:::0}   Patient and/or Family Response: ***  Standardized Assessments completed: {IBH Screening Tools:21014051:::0}  Patient Centered Plan: Patient is on the following Treatment Plan(s):  ***  Clinical Assessment/Diagnosis  No diagnosis found.      Assessment: Patient currently experiencing ***.   Patient may benefit from ***.  Coordination of Care: {CHL AMB BH COORDINATION OF CARE:[phone]}  DSM-5 Diagnosis: ***  Recommendations for Services/Supports/Treatments: ***  Progress towards Goals: {CHL AMB BH PROGRESS TOWARDS HNJOD:7896499943}  Treatment Plan Summary: Behavioral Health Clinician will: {CHL AMB BH TREATMENT PLAN SUMMARY THERAPIST TPOO:7896499945}  Individual will: {CHL AMB BH TREATMENT PLAN SUMMARY INDIVDUAL TPOO:7896499946}  Referral(s): {IBH Referrals:21014055}  [employer], LCSW                 [place], [name]
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
## 2023-11-04 ENCOUNTER — Ambulatory Visit (INDEPENDENT_AMBULATORY_CARE_PROVIDER_SITE_OTHER): Payer: MEDICAID | Admitting: Clinical

## 2023-11-04 ENCOUNTER — Encounter (INDEPENDENT_AMBULATORY_CARE_PROVIDER_SITE_OTHER): Payer: Self-pay

## 2023-11-04 DIAGNOSIS — F4322 Adjustment disorder with anxiety: Secondary | ICD-10-CM

## 2023-11-04 NOTE — Progress Notes (Signed)
                Samantha Nguyen [name], LCSW
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
## 2023-11-09 ENCOUNTER — Ambulatory Visit: Payer: MEDICAID | Admitting: Clinical

## 2023-11-09 DIAGNOSIS — F4322 Adjustment disorder with anxiety: Secondary | ICD-10-CM | POA: Diagnosis not present

## 2023-11-09 NOTE — Progress Notes (Addendum)
 [facility] Counselor/Therapist Progress Note IN PERSON VISIT   Patient ID: Samantha Nguyen, MRN: [id number],    Date: 11/09/2023  Time Spent: 55 min  Treatment Type: Individual Therapy  Reported Symptoms: Anxiety and fear of having panic attacks  Mental Status Exam:  Appearance:  Casual     Behavior: Appropriate  Motor: Normal  Speech/Language:  Normal Rate  Affect: Appropriate  Mood: anxious  Thought process: normal  Thought content:   WNL  Sensory/Perceptual disturbances:   WNL  Orientation: oriented to person, place, time/date, and situation  Attention: Good  Concentration: Fair  Memory: WNL  Fund of knowledge:  Good  Insight:   Good  Judgment:  Good  Impulse Control: Good   Risk Assessment: No changes Danger to Self:  No Self-injurious Behavior: No Danger to Others: No Duty to Warn:no Physical Aggression / Violence:No    Subjective:  Samantha Nguyen presented to be alert and reported anxiety symptoms this morning, specifically somatic symptoms with her heart beating fast.    Interventions:  Cognitive Behavioral Therapy and Psycho-education/Bibliotherapy - Ongoing psycho education on CBT using CBT triangle and identifying, thoughts, feelings & actions.  Identified coping strategies that she's used in the past. Although she did not practice the heart math, she was able to implement positive self talk in order to come to therapy instead of stay home when she was feeling anxious.  Diagnosis:Adjustment disorder with anxious mood  Patient Response:  Samantha Nguyen increased her knowledge on identifying unhelpful/helpful thoughts and identifying alternative thoughts or actions that she can try.  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  She will also work on remembering the positive things that she does and has happened.  Plan:   Samantha Nguyen will practice identifying & challenging unhelpful thoughts and practice positive self-talk. Samantha Nguyen  will complete a self-referral to [facility] for EMDR.  France Lusty [name], LCSW

## 2023-11-16 ENCOUNTER — Ambulatory Visit: Payer: MEDICAID | Admitting: Clinical

## 2023-11-18 ENCOUNTER — Other Ambulatory Visit: Payer: Self-pay

## 2023-11-18 ENCOUNTER — Emergency Department
Admission: EM | Admit: 2023-11-18 | Discharge: 2023-11-18 | Discharge status: Against Medical Advice | Source: Ambulatory Visit | Attending: Emergency Medicine | Admitting: Emergency Medicine

## 2023-11-18 ENCOUNTER — Ambulatory Visit: Payer: MEDICAID | Admitting: Urology

## 2023-11-18 DIAGNOSIS — Z5321 Procedure and treatment not carried out due to patient leaving prior to being seen by health care provider: Secondary | ICD-10-CM | POA: Insufficient documentation

## 2023-11-18 DIAGNOSIS — I517 Cardiomegaly: Secondary | ICD-10-CM

## 2023-11-18 DIAGNOSIS — Z789 Other specified health status: Secondary | ICD-10-CM

## 2023-11-18 NOTE — ED Notes (Signed)
 OHPx4. Pt did not make self know to staff. Assumed to have left.

## 2023-11-18 NOTE — ED Notes (Signed)
 11/18/23 1700 Expected Patient Date of notification 11/18/23 Time Notified 1701 ED Service Adult Call-in Pt Info note/Reason for sending [place] note: Heavy vaginal bleeding x 2 days   Weak , lethargic sob  Midline abdominal pain x2 days  [id number] clear lungs 89pr  Chf and copd hx Expected Call-In Information Requested Evaluation By Adult ED Report called to call in note

## 2023-11-18 NOTE — ED Triage Notes (Signed)
 See call in note. Heavy vaginal bleeding x2 days, reports that this is her regular period week but bleeding more than normal. Feeling fatigued, nasal canula 2L for comfort. Reports feeling short of breath, hx CPOD/CHF. Reports that she has been compliant with medications and additionally c/o abdominal pain with palpation. Denies any chance of pregnancy or changes to bowel/bladder habits. EKG in triage Blood Glucose Meter (mg/dl): [id number] medications given: No

## 2023-11-23 ENCOUNTER — Ambulatory Visit: Payer: MEDICAID | Admitting: Clinical

## 2023-11-23 NOTE — Progress Notes (Unsigned)
 [facility] Counselor/Therapist Progress Note  Patient ID: Samantha Nguyen, MRN: [id number]    Date: 11/23/23  Time Spent: ***   - ***  : *** Minutes  Types of Service: {CHL AMB TYPE OF [phone]}  Reported Symptoms: ***  Mental Status Exam: Appearance:  {PSY:22683}     Behavior: {PSY:21022743}  Motor: {PSY:22302}  Speech/Language:  {PSY:22685}  Affect: {PSY:22687}  Mood: {PSY:31886}  Thought process: {PSY:31888}  Thought content:   {PSY:[phone]}  Sensory/Perceptual disturbances:   {PSY:[phone]}  Orientation: {PSY:30297}  Attention: {PSY:22877}  Concentration: {[phone]}  Memory: {[phone]}  Fund of knowledge:  {[phone]}  Insight:   {[phone]}  Judgment:  {[phone]}  Impulse Control: {[phone]}   Risk Assessment: Danger to Self:  {PSY:22692} Self-injurious Behavior: {PSY:22692} Danger to Others: {PSY:22692} Duty to Warn:{PSY:311194}   Subjective:   ***   Interventions: {[phone]}  Client Response: ***  Follow up on: Samantha Nguyen will practice identifying & challenging unhelpful thoughts and practice positive self-talk. Samantha Nguyen will complete a self-referral to [facility] for EMDR.  Diagnosis:  No diagnosis found.   Goals, Assessment & Plan:   ***   Frequency: ***  Modality: individual      Goal: Increase implementation of coping strategies to prevent panic & anxiety attacks  Target Date: ***  Progress: ***   Goal: ***  Target Date: ***  Progress: ***    [name], MSW, LCSW [employer] Therapist Main Office: [phone]                [name], [name]

## 2023-11-24 LAB — EKG 12-LEAD
P: 65 deg
PR: 147 ms
QRS: 19 deg
QRSD: 94 ms
QT: 364 ms
QTc: 445 ms
Rate: 90 {beats}/min
T: 8 deg

## 2023-11-26 ENCOUNTER — Other Ambulatory Visit: Payer: Self-pay | Admitting: Emergency Medicine

## 2023-11-26 LAB — CBC AND DIFFERENTIAL
Baso # K/uL: 0 X10 3/uL (ref 0.0–0.2)
Basophil %: 0 % (ref 0–2)
Eos # K/uL: 0.1 X10 3/uL (ref 0.0–0.7)
Eosinophil %: 1 % (ref 0–8)
Hematocrit: 24.6 % — ABNORMAL LOW (ref 37.5–47.7)
Hemoglobin: 6.3 g/dL — CL (ref 12.1–15.8)
Immature Granulocytes Absolute: 0.1 X10 3/uL — ABNORMAL HIGH (ref 0.0–0.031)
Immature Granulocytes: 0.8 % — ABNORMAL HIGH (ref 0.0–0.4)
Lymph # K/uL: 1.9 X10 3/uL (ref 1.0–5.2)
Lymphocyte %: 16 % (ref 13–42)
MCH: 19.4 pg — ABNORMAL LOW (ref 26.4–33.6)
MCHC: 25.6 g/dL — ABNORMAL LOW (ref 31.9–37.3)
MCV: 76 fL — ABNORMAL LOW (ref 80–93)
Mean Platelet Volume: 9.7 fL — ABNORMAL LOW (ref 10.1–10.4)
Mono # K/uL: 0.8 X10 3/uL (ref 0.3–1.1)
Monocyte %: 7 % (ref 2–12)
Neut # K/uL: 9.1 X10 3/uL — ABNORMAL HIGH (ref 2.1–8.0)
Nucl RBC # K/uL: 0.06 x10 3/uL — ABNORMAL HIGH (ref 0.0–0.012)
Nucl RBC %: 0.5 % — ABNORMAL HIGH (ref 0.0–0.2)
Platelets: 403 10*3 — ABNORMAL HIGH (ref 144–366)
RBC Distribution Width-SD: 47.7 fL — ABNORMAL HIGH (ref 41.0–45.3)
RBC: 3.25 10*6 — ABNORMAL LOW (ref 4.16–5.34)
RDW: 17.7 % — ABNORMAL HIGH (ref 12.8–14.2)
Seg Neut %: 75 % (ref 44–79)
WBC: 12.09 10*3 — ABNORMAL HIGH (ref 4.3–11.0)

## 2023-11-26 LAB — COMPREHENSIVE METABOLIC PANEL
ALT: 16 U/L (ref 0–33)
AST: 18 U/L (ref 0–40)
Albumin: 3.9 g/dL (ref 3.5–5.2)
Alk Phos: 97 U/L (ref 35–104)
Anion Gap: 9
Bilirubin,Total: <0.3 mg/dL (ref 0.0–1.0)
CO2: 27 MMOL/L (ref 22–29)
Calcium: 9.6 mg/dL (ref 8.6–10.4)
Chloride: 103 MMOL/L (ref 98–107)
Creatinine: 0.7 mg/dL (ref 0.5–0.9)
Glucose: 127 mg/dL — ABNORMAL HIGH (ref 70–100)
Lab: 11 mg/dL (ref 6–20)
Potassium: 4.4 MMOL/L (ref 3.5–5.1)
Sodium: 139 MMOL/L (ref 136–145)
Total Protein: 6.5 g/dL — ABNORMAL LOW (ref 6.6–8.7)
eGFR BY CREAT: 113

## 2023-11-26 LAB — TROPONIN T 1 HR W/ DELTA HIGH SENSITIVITY: TROP T 1 HR High Sensitivity: 15 ng/L (ref 0.0–19.0)

## 2023-11-26 LAB — NT-PRO BNP: NT-pro BNP: 117 pg/mL (ref 0–450)

## 2023-11-26 LAB — TROPONIN T 0 HR HIGH SENSITIVITY (IP/ED ONLY): TROP T 0 HR High Sensitivity: 16 ng/L (ref 0.0–19.0)

## 2023-11-26 LAB — PREGNANCY TEST, SERUM: Preg,Serum: NEGATIVE

## 2023-11-27 LAB — BASIC METABOLIC PANEL
Anion Gap: 11
CO2: 27 MMOL/L (ref 22–29)
Calcium: 9.3 mg/dL (ref 8.6–10.4)
Chloride: 100 MMOL/L (ref 98–107)
Creatinine: 0.7 mg/dL (ref 0.5–0.9)
Glucose: 161 mg/dL — ABNORMAL HIGH (ref 70–100)
Lab: 15 mg/dL (ref 6–20)
Potassium: 4.3 MMOL/L (ref 3.5–5.1)
Sodium: 138 MMOL/L (ref 136–145)
eGFR BY CREAT: 113

## 2023-11-27 LAB — CBC AND DIFFERENTIAL
Baso # K/uL: 0.1 X10 3/uL (ref 0.0–0.2)
Basophil %: 0 % (ref 0–2)
Eos # K/uL: 0.1 X10 3/uL (ref 0.0–0.7)
Eosinophil %: 1 % (ref 0–8)
Hematocrit: 27.2 % — ABNORMAL LOW (ref 37.5–47.7)
Hemoglobin: 7.4 g/dL — ABNORMAL LOW (ref 12.1–15.8)
Immature Granulocytes Absolute: 0.18 X10 3/uL — ABNORMAL HIGH (ref 0.0–0.031)
Immature Granulocytes: 1.3 % — ABNORMAL HIGH (ref 0.0–0.4)
Lymph # K/uL: 2.6 X10 3/uL (ref 1.0–5.2)
Lymphocyte %: 19 % (ref 13–42)
MCH: 20.6 pg — ABNORMAL LOW (ref 26.4–33.6)
MCHC: 27.2 g/dL — ABNORMAL LOW (ref 31.9–37.3)
MCV: 76 fL — ABNORMAL LOW (ref 80–93)
Mean Platelet Volume: 9.4 fL — ABNORMAL LOW (ref 10.1–10.4)
Mono # K/uL: 1 X10 3/uL (ref 0.3–1.1)
Monocyte %: 7 % (ref 2–12)
Neut # K/uL: 9.9 X10 3/uL — ABNORMAL HIGH (ref 2.1–8.0)
Nucl RBC # K/uL: 0.11 x10 3/uL — ABNORMAL HIGH (ref 0.0–0.012)
Nucl RBC %: 0.8 % — ABNORMAL HIGH (ref 0.0–0.2)
Platelets: 368 10*3 — ABNORMAL HIGH (ref 144–366)
RBC Distribution Width-SD: 49.3 fL — ABNORMAL HIGH (ref 41.0–45.3)
RBC: 3.59 10*6 — ABNORMAL LOW (ref 4.16–5.34)
RDW: 18.2 % — ABNORMAL HIGH (ref 12.8–14.2)
Seg Neut %: 72 % (ref 44–79)
WBC: 13.77 10*3 — ABNORMAL HIGH (ref 4.3–11.0)

## 2023-11-27 LAB — HEMATOCRIT: Hematocrit: 30.6 % — ABNORMAL LOW (ref 37.5–47.7)

## 2023-11-27 LAB — HEMOGLOBIN: Hemoglobin: 8.4 g/dL — ABNORMAL LOW (ref 12.1–15.8)

## 2023-11-27 LAB — MAGNESIUM: Magnesium: 1.9 mg/dL (ref 1.7–2.6)
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
## 2023-11-27 NOTE — Progress Notes (Signed)
 [facility] [address] [place], [place]  [address], [place]  [phone] PATIENT: Krider,ASHLEYDOB: 03-15-1987MR: [id number] Account: [id number] Attending: VIVIANA LYE DODate of Admission: 09/13/25Progress Note Date: 09/14/25Progress Note Time: 1412  Progress Note  SubjectiveEvents since last encounterPt feels SOB when laying flat  Objective ExamVitals and I/O                                       Vital Signs                                         Result    Date Time                           Pulse Ox            97  09/14 1200                           B/P             149/73  09/14 1200                           B/P Mean            98  09/14 1200                           O2 Delivery   Room Air  09/14 1200                           O2 Flow Rate       0.0  09/14 1200                           Temp              97.9  09/14 1200                           Pulse               80  09/14 1200                           Resp                19  09/14 1200                                      Intake   Output                                  09/14 1600  09/14 0800  09/14 0000                   Intake Total  Output Total                                 1200                   Balance                                     -1200                    Output, Urine                                1200                   Patient          164.7 kg  170.075 kg  170.075 kg                   Weight                   Weight         Standing                   Measurement                   Method  General Appearance Alert, Oriented X3, Cooperative, No Acute Distress Assessment/PlanImpressionThis is a 38 year old Female who presents with Problem/Plan 1. Asthma exacerbation   Assessment   PlanPt wiht initally poor air  movement.Pt feels better Plan:- C/w IV solumedrol- duonebs- hold off on azithro due top QTc prolongation  2. Iron deficiency anemia   Status Chronic   Qualifiers Iron deficiency anemia type: unspecified iron deficiency Qualified Code: D50.9 - Iron deficiency anemia, unspecified   Assessment   PlanReports ongoing heavy menses, shortness of breath, fatigue.Not taking her oral ironWas suppose to get routine iron infusions, but did not go to the appointments2 units PRBCs ordered in ER Plan:- trend hgb- transfuse PRBC as needed- increase ferrous sulfate from once/day to BID  3. Diastolic heart failure   Assessment   PlanHas been off her Coreg and Torsemide for over 1 week as does not have her medications with her at the shelter.Pt feesl SOB when laying down.Pro-BNP not elevated, but may be falsely low due to obesity Plan:- c/w IV Lasix BID- Monitor I Os, daily weights- duonebs   4. Hidradenitis suppurativa   Assessment   PlanReports new lesion under left breastc/w doxycycline BIDprobiotics  5. Uncontrolled hypertension   Assessment   PlanHas been without her medications for > 1 weekMed list from [facility] lists Coreg 6.25mg  2 tabs BID - she reports she was only taking 1tab once a dayWill resume at 6.25mg  BID, but may need to increase back to 12.5mg  BID if BP remains elevatedBP improved while only on carvedilol  VTESCD: Yes Time Spent with PatientRound with RN Thomos name [email or web address] Time Excluding Procedures (mins) 35    Electronically Signed by: EVALENE ARGYLE, DO               Date/Time: 11/27/23 1422  Date/Time:  Report Number: [id number]            Report Name: Hospitalist Progress NoteCC:

## 2023-11-28 LAB — CBC
Hematocrit: 29.9 % — ABNORMAL LOW (ref 37.5–47.7)
Hemoglobin: 8.1 g/dL — ABNORMAL LOW (ref 12.1–15.8)
MCH: 20.6 pg — ABNORMAL LOW (ref 26.4–33.6)
MCHC: 27.1 g/dL — ABNORMAL LOW (ref 31.9–37.3)
MCV: 76 fL — ABNORMAL LOW (ref 80–93)
Mean Platelet Volume: 9.8 fL — ABNORMAL LOW (ref 10.1–10.4)
Nucl RBC # K/uL: 0.08 x10 3/uL — ABNORMAL HIGH (ref 0.0–0.012)
Nucl RBC %: 0.3 % — ABNORMAL HIGH (ref 0.0–0.2)
Platelets: 447 10*3 — ABNORMAL HIGH (ref 144–366)
RBC Distribution Width-SD: 49.5 fL — ABNORMAL HIGH (ref 41.0–45.3)
RBC: 3.93 10*6 — ABNORMAL LOW (ref 4.16–5.34)
RDW: 18.5 % — ABNORMAL HIGH (ref 12.8–14.2)
WBC: 27.54 10*3 — ABNORMAL HIGH (ref 4.3–11.0)

## 2023-11-28 LAB — HEMOGLOBIN A1C: Hemoglobin A1C: 5.5 %

## 2023-11-28 LAB — BASIC METABOLIC PANEL
Anion Gap: 10
CO2: 27 MMOL/L (ref 22–29)
Calcium: 9.6 mg/dL (ref 8.6–10.4)
Chloride: 98 MMOL/L (ref 98–107)
Creatinine: 0.8 mg/dL (ref 0.5–0.9)
Glucose: 314 mg/dL — ABNORMAL HIGH (ref 70–100)
Lab: 18 mg/dL (ref 6–20)
Potassium: 4.9 MMOL/L (ref 3.5–5.1)
Sodium: 135 MMOL/L — ABNORMAL LOW (ref 136–145)
eGFR BY CREAT: 97

## 2023-11-28 LAB — FINGERSTICK GLUCOSE
FINGERSTICK GLUCOSE: 306 mg/dL — ABNORMAL HIGH (ref 70–110)
FINGERSTICK GLUCOSE: 339 mg/dL — ABNORMAL HIGH (ref 70–110)
FINGERSTICK GLUCOSE: 460 mg/dL (ref 70–110)

## 2023-11-28 LAB — MAGNESIUM: Magnesium: 2 mg/dL (ref 1.7–2.6)

## 2023-11-28 LAB — PHOSPHORUS: Phosphorus: 2.7 mg/dL (ref 2.7–4.5)

## 2023-11-28 NOTE — Progress Notes (Signed)
 [facility] [address] [place], [place]  [address]  [phone] PATIENT: Jenna [name],ASHLEYDOB: 1987/04/21MR: [id number] Account: [id number] Attending: VIVIANA LYE DODate of Admission: 09/13/25Progress Note Date: 09/15/25Progress Note Time: 1248  Progress Note  SubjectiveEvents since last encounterPt feels better with less SOB Objective ExamVitals and I/O                                       Vital Signs                                         Result    Date Time                           Pulse Ox            96  09/15 1200                           B/P             171/79  09/15 1200                           B/P Mean           109  09/15 1200                           O2 Delivery   Room Air  09/15 1200                           O2 Flow Rate       0.0  09/15 1200                           Temp              98.3  09/15 1200                           Pulse               93  09/15 1200                           Resp                18  09/15 1200                                      Intake   Output                                  09/15 1600  09/15 0800  09/15 0000                    Intake Total  960        1980                    Output Total                    Balance                          960        1980                     Intake, Oral                     960        1980                    Number                             1                    Bowel                    Movements                    Number Voids                       2           3  General Appearance Alert, Oriented X3, Cooperative, No Acute Distress Assessment/PlanImpressionThis is a 38 year old Female who presents with Problem/Plan 1. Asthma exacerbation   Assessment   PlanPt wiht initally poor air movement.Pt  feels better.Pt passed home O2 eval on 11/28/23 Plan:- decrease solumedrol to prednisone 50mg  PO daily- duonebs- hold off on azithro due top QTc prolongation  2. Hyperglycemia   Assessment   PlanGlu into the 300s. Plan:- SSI- FS achs- decreased steroid above- check A1c  3. Iron deficiency anemia   Status Chronic   Qualifiers Iron deficiency anemia type: unspecified iron deficiency Qualified Code: D50.9 - Iron deficiency anemia, unspecified   Assessment   PlanReports ongoing heavy menses, shortness of breath, fatigue.Not taking her oral ironWas suppose to get routine iron infusions, but did not go to the appointments2 units PRBCs ordered in ER.Hgb is >8 since trasnfusions on 11/27/23. Plan:- trend hgb- transfuse PRBC as needed- increased ferrous sulfate from once/day to BID  4. Diastolic heart failure   Assessment   PlanHas been off her Coreg and Torsemide for over 1 week as does not have her medications with her at the shelter.Pt feesl SOB when laying down.Pro-BNP not elevated, but may be falsely low due to obesity Plan:- c/w IV Lasix BID. Can transition to increased dose of home torsemide- Monitor I Os, daily weights- duonebs   5. Hidradenitis suppurativa   Assessment   PlanReports new lesion under left breastc/w doxycycline BIDprobiotics Can follow up outpt  6. Uncontrolled hypertension   Assessment   PlanHas been without her medications for > 1 weekMed list from [facility] lists Coreg 6.25mg  2 tabs BID - she reports she was only taking 1tab once a dayWill resume at 6.25mg  BID, but may need to increase back to 12.5mg  BID if BP remains elevatedBP improved while only on carvedilol  VTESCD: Yes Time Spent with PatientRound with RN  YesNurse name [email or web address] Time Excluding Procedures (mins) 35    Electronically Signed by: EVALENE ARGYLE, DO               Date/Time:  11/28/23 1313                                                         Date/Time:  Report Number: [id number]            Report Name: Hospitalist Progress NoteCC:

## 2023-11-29 ENCOUNTER — Encounter: Payer: Self-pay | Admitting: Internal Medicine

## 2023-11-29 LAB — CBC
Hematocrit: 29.1 % — ABNORMAL LOW (ref 37.5–47.7)
Hemoglobin: 7.9 g/dL — ABNORMAL LOW (ref 12.1–15.8)
MCH: 21 pg — ABNORMAL LOW (ref 26.4–33.6)
MCHC: 27.1 g/dL — ABNORMAL LOW (ref 31.9–37.3)
MCV: 77 fL — ABNORMAL LOW (ref 80–93)
Mean Platelet Volume: 9.2 fL — ABNORMAL LOW (ref 10.1–10.4)
Nucl RBC # K/uL: 0.11 x10 3/uL — ABNORMAL HIGH (ref 0.0–0.012)
Nucl RBC %: 0.5 % — ABNORMAL HIGH (ref 0.0–0.2)
Platelets: 382 10*3 — ABNORMAL HIGH (ref 144–366)
RBC Distribution Width-SD: 51.7 fL — ABNORMAL HIGH (ref 41.0–45.3)
RBC: 3.76 10*6 — ABNORMAL LOW (ref 4.16–5.34)
RDW: 19.5 % — ABNORMAL HIGH (ref 12.8–14.2)
WBC: 24.43 10*3 — ABNORMAL HIGH (ref 4.3–11.0)

## 2023-11-29 LAB — FINGERSTICK GLUCOSE
FINGERSTICK GLUCOSE: 270 mg/dL — ABNORMAL HIGH (ref 70–110)
FINGERSTICK GLUCOSE: 223 mg/dL — ABNORMAL HIGH (ref 70–110)

## 2023-11-29 LAB — BASIC METABOLIC PANEL
Anion Gap: 9
CO2: 28 MMOL/L (ref 22–29)
Calcium: 8.9 mg/dL (ref 8.6–10.4)
Chloride: 101 MMOL/L (ref 98–107)
Creatinine: 0.8 mg/dL (ref 0.5–0.9)
Glucose: 280 mg/dL — ABNORMAL HIGH (ref 70–100)
Lab: 22 mg/dL — ABNORMAL HIGH (ref 6–20)
Potassium: 4.1 MMOL/L (ref 3.5–5.1)
Sodium: 138 MMOL/L (ref 136–145)
eGFR BY CREAT: 97

## 2023-11-29 LAB — PHOSPHORUS: Phosphorus: 3.4 mg/dL (ref 2.7–4.5)

## 2023-11-29 LAB — MAGNESIUM: Magnesium: 1.9 mg/dL (ref 1.7–2.6)
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
## 2023-11-29 NOTE — Discharge Summary (Signed)
 [facility] [address] [place], [place]  [address], [place]  [phone]) [phone] PATIENT: JennaASHLEYDOB: 11-09-1987MR: [id number] Account: [id number] Attending: VIVIANA EVALENE Grew of Admission: 09/15/25Progress Note Date: 09/16/25Progress Note Time: 1147  Progress Note  Discharge NoteProblem ListMedical Problems    Asthma exacerbation    Diastolic heart failure    Hidradenitis suppurativa    Hyperglycemia    Iron deficiency anemia (Chronic)    Uncontrolled hypertension General Appearance Alert, Oriented X3, Cooperative, No Acute DistressADL's IndependentDischarge Care Plan and Goals You are being treated for [asthma exacerbation and heart failure exacerbation]. [Prednisone and all your home meds as you requested] prescription(s) have been sent to your pharmacy. Please continue to take all his medications regularly. Please stop smoking.  Call your family doctor's office as soon as possible to make an appointment for hospital discharge follow-up and also needing to follow up with your cardiologist for your heart failure. Return to your primary care provider within 7-10 days.Keep and attend follow up appointments.   Please contact your primary care physician if you are feeling worse after discharge or return to the Emergency Room for severe illness. For questions regarding you discharge plan including but not limited to: Durable MedicalEquipment, Home Oxygen Services, Visiting Nurse Services, please contact our Care Management Office at [phone].     Electronically Signed by: DESMA GORMAN MARINER                 Date/Time: 11/29/23 1151                                                         Date/Time:  Report Number: [id number]            Report Name: Physician Discharge NoteCC:

## 2023-11-29 NOTE — Discharge Summary (Signed)
 [facility] [address] [place], [place]  [address], [place]  [phone]) [phone] PATIENT: Jenna [name],ASHLEYDOB: October 15, 1987MR: [id number] Account: [id number] Attending: VIVIANA EVALENE Grew of Admission: 09/15/25Progress Note Date: 09/16/25Progress Note Time: 1152  Progress Note  Discharge NoteDischarge Plan HomeProblem ListMedical Problems    Asthma exacerbation    Diastolic heart failure    Hidradenitis suppurativa    Hyperglycemia    Iron deficiency anemia (Chronic)    Uncontrolled hypertension General Appearance Alert, Oriented X3, Cooperative, No Acute DistressADL's IndependentDiet Heart HealthyActivity Resume normal activityDischarge Care Plan and Goals You are being treated for [asthma exacerbation and heart failure exacerbation]. [Prednisone and all your home meds as you requested] prescription(s) have been sent to your pharmacy. Please continue to take all his medications regularly. Please stop smoking.  Call your family doctor's office as soon as possible to make an appointment for hospital discharge follow-up and also needing to follow up with your cardiologist for your heart failure and also with your OB/GYN for excessive menstrual bleeding. Return to your primary care provider within 7-10 days.Keep and attend follow up appointments.   Please contact your primary care physician if you are feeling worse after discharge or return to the Emergency Room for severe illness. For questions regarding you discharge plan including but not limited to: Durable MedicalEquipment, Home Oxygen Services, Visiting Nurse Services, please contact our Care Management Office at [phone].     Electronically Signed by: DESMA GORMAN MARINER                 Date/Time: 11/29/23 1152                                                          Date/Time:  Report Number: [id number]            Report Name: Physician Discharge NoteCC:

## 2023-11-30 ENCOUNTER — Encounter: Payer: Self-pay | Admitting: Family

## 2023-11-30 DIAGNOSIS — H73893 Other specified disorders of tympanic membrane, bilateral: Secondary | ICD-10-CM

## 2023-12-01 ENCOUNTER — Telehealth: Payer: Self-pay

## 2023-12-01 NOTE — Telephone Encounter (Signed)
 Referral placed, MyChart message sent to patient.   Copied from CRM [id number]. Topic: Referral - Question >> Dec 01, 2023  1:14 PM Samantha Nguyen wrote: Reason for CRM: Patient would like referral sent to [facility] at [facility]//Please send the patient a message via mychart once completed

## 2023-12-02 ENCOUNTER — Ambulatory Visit: Payer: MEDICAID | Admitting: Clinical
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
## 2023-12-05 ENCOUNTER — Ambulatory Visit: Payer: MEDICAID | Admitting: Clinical

## 2023-12-07 ENCOUNTER — Emergency Department

## 2023-12-07 ENCOUNTER — Other Ambulatory Visit: Payer: Self-pay

## 2023-12-07 DIAGNOSIS — Z789 Other specified health status: Secondary | ICD-10-CM

## 2023-12-07 DIAGNOSIS — I5030 Unspecified diastolic (congestive) heart failure: Secondary | ICD-10-CM

## 2023-12-07 DIAGNOSIS — Z1152 Encounter for screening for COVID-19: Secondary | ICD-10-CM | POA: Insufficient documentation

## 2023-12-07 DIAGNOSIS — Z760 Encounter for issue of repeat prescription: Secondary | ICD-10-CM

## 2023-12-07 DIAGNOSIS — K5900 Constipation, unspecified: Secondary | ICD-10-CM | POA: Insufficient documentation

## 2023-12-07 DIAGNOSIS — R600 Localized edema: Secondary | ICD-10-CM | POA: Insufficient documentation

## 2023-12-07 DIAGNOSIS — Z5901 Sheltered unhousedness: Secondary | ICD-10-CM | POA: Insufficient documentation

## 2023-12-07 DIAGNOSIS — I110 Hypertensive heart disease with heart failure: Secondary | ICD-10-CM | POA: Insufficient documentation

## 2023-12-07 DIAGNOSIS — K219 Gastro-esophageal reflux disease without esophagitis: Secondary | ICD-10-CM | POA: Insufficient documentation

## 2023-12-07 DIAGNOSIS — R0602 Shortness of breath: Secondary | ICD-10-CM

## 2023-12-07 DIAGNOSIS — I5033 Acute on chronic diastolic (congestive) heart failure: Principal | ICD-10-CM | POA: Insufficient documentation

## 2023-12-07 DIAGNOSIS — I517 Cardiomegaly: Secondary | ICD-10-CM

## 2023-12-07 DIAGNOSIS — F17210 Nicotine dependence, cigarettes, uncomplicated: Secondary | ICD-10-CM | POA: Insufficient documentation

## 2023-12-07 DIAGNOSIS — Z91148 Patient's other noncompliance with medication regimen for other reason: Secondary | ICD-10-CM | POA: Insufficient documentation

## 2023-12-07 DIAGNOSIS — J45909 Unspecified asthma, uncomplicated: Secondary | ICD-10-CM | POA: Insufficient documentation

## 2023-12-07 DIAGNOSIS — J811 Chronic pulmonary edema: Secondary | ICD-10-CM

## 2023-12-07 DIAGNOSIS — D509 Iron deficiency anemia, unspecified: Secondary | ICD-10-CM | POA: Insufficient documentation

## 2023-12-07 DIAGNOSIS — Z91410 Personal history of adult physical and sexual abuse: Secondary | ICD-10-CM | POA: Insufficient documentation

## 2023-12-07 LAB — CBC AND DIFFERENTIAL
Baso # K/uL: 0.1 THOU/uL (ref 0.0–0.2)
Eos # K/uL: 0.1 THOU/uL (ref 0.0–0.5)
Hematocrit: 29 % — ABNORMAL LOW (ref 34–49)
Hemoglobin: 7.7 g/dL — ABNORMAL LOW (ref 11.2–16.0)
IMM Granulocytes #: 0.1 THOU/uL — ABNORMAL HIGH (ref 0–0)
IMM Granulocytes: 0.6 %
Lymph # K/uL: 2 THOU/uL (ref 1.0–5.0)
MCV: 78 fL (ref 75–100)
Mono # K/uL: 1 THOU/uL (ref 0.1–1.0)
Neut # K/uL: 11.4 THOU/uL — ABNORMAL HIGH (ref 1.5–6.5)
Nucl RBC # K/uL: 0 THOU/uL (ref 0.0–0.1)
Nucl RBC %: 0.1 /100{WBCs} (ref 0.0–0.2)
Platelets: 371 THOU/uL (ref 150–450)
RBC: 3.7 MIL/uL — ABNORMAL LOW (ref 4.0–5.5)
RDW: 21.3 % — ABNORMAL HIGH (ref 0.0–15.0)
Seg Neut %: 77.8 %
WBC: 14.6 THOU/uL — ABNORMAL HIGH (ref 3.5–11.0)

## 2023-12-07 LAB — PLASMA PROF 7 (ED ONLY)
Anion Gap,PL: 13 (ref 7–16)
CO2,Plasma: 26 mmol/L (ref 20–28)
Chloride,Plasma: 103 mmol/L (ref 96–108)
Creatinine: 0.8 mg/dL (ref 0.51–0.95)
Glucose,Plasma: 118 mg/dL — ABNORMAL HIGH (ref 60–99)
Potassium,Plasma: 4.2 mmol/L (ref 3.3–4.6)
Sodium,Plasma: 142 mmol/L (ref 133–145)
UN,Plasma: 12 mg/dL (ref 6–20)
eGFR BY CREAT: 97

## 2023-12-07 LAB — RUQ PANEL (ED ONLY)
ALT: 20 U/L (ref 0–35)
AST: 26 U/L (ref 0–35)
Albumin: 3.9 g/dL (ref 3.5–5.2)
Alk Phos: 92 U/L (ref 35–105)
Amylase: 50 U/L (ref 28–100)
Bilirubin,Direct: <0.2 mg/dL (ref 0.0–0.3)
Bilirubin,Total: 0.3 mg/dL (ref 0.0–1.2)
Lipase: 44 U/L (ref 13–60)
Total Protein: 6.5 g/dL (ref 6.3–7.7)

## 2023-12-07 LAB — TROPONIN T 0 HR HIGH SENSITIVITY (IP/ED ONLY): TROP T 0 HR High Sensitivity: 16 ng/L — ABNORMAL HIGH (ref 0–11)

## 2023-12-07 LAB — TROPONIN T 3 HR W/ DELTA HIGH SENSITIVITY (IP/ED ONLY)
TROP T 0-3 HR DELTA High Sensitivity: -2 — ABNORMAL LOW (ref 0–4)
TROP T 3 HR High Sensitivity: 14 ng/L (ref 0–14)

## 2023-12-07 NOTE — ED Triage Notes (Signed)
 Pt reports out of all medications for the past five days. Today with cough, generalized fatigue, weakness, subjective fevers/chills. EMS reports SpO2 drops to low 90's after coughing fits, placed on 4L O2. Reports chest pain, SOB only after coughing. +BLE edema. Hx asthma, COPD, HTN. EKG in triage.   Prehospital medications given: No

## 2023-12-08 ENCOUNTER — Encounter: Payer: Self-pay | Admitting: Emergency Medicine

## 2023-12-08 ENCOUNTER — Observation Stay
Admission: EM | Admit: 2023-12-08 | Discharge: 2023-12-09 | Disposition: A | Discharge status: Home / Self Care | Source: Ambulatory Visit | Attending: Internal Medicine | Admitting: Internal Medicine

## 2023-12-08 ENCOUNTER — Observation Stay: Admitting: Radiology

## 2023-12-08 DIAGNOSIS — D500 Iron deficiency anemia secondary to blood loss (chronic): Secondary | ICD-10-CM

## 2023-12-08 DIAGNOSIS — I509 Heart failure, unspecified: Principal | ICD-10-CM

## 2023-12-08 DIAGNOSIS — J4550 Severe persistent asthma, uncomplicated: Secondary | ICD-10-CM

## 2023-12-08 DIAGNOSIS — R0600 Dyspnea, unspecified: Secondary | ICD-10-CM

## 2023-12-08 DIAGNOSIS — I5033 Acute on chronic diastolic (congestive) heart failure: Secondary | ICD-10-CM

## 2023-12-08 DIAGNOSIS — M79605 Pain in left leg: Secondary | ICD-10-CM

## 2023-12-08 DIAGNOSIS — D509 Iron deficiency anemia, unspecified: Secondary | ICD-10-CM | POA: Diagnosis present

## 2023-12-08 DIAGNOSIS — M79604 Pain in right leg: Secondary | ICD-10-CM

## 2023-12-08 DIAGNOSIS — Z8742 Personal history of other diseases of the female genital tract: Secondary | ICD-10-CM

## 2023-12-08 DIAGNOSIS — Z59819 Housing instability, housed unspecified: Secondary | ICD-10-CM | POA: Insufficient documentation

## 2023-12-08 DIAGNOSIS — F32A Depression, unspecified: Secondary | ICD-10-CM | POA: Diagnosis present

## 2023-12-08 DIAGNOSIS — J45909 Unspecified asthma, uncomplicated: Secondary | ICD-10-CM | POA: Diagnosis present

## 2023-12-08 HISTORY — DX: Heart failure, unspecified: I50.9

## 2023-12-08 LAB — CBC
Hematocrit: 31 % — ABNORMAL LOW (ref 34–49)
Hemoglobin: 8.1 g/dL — ABNORMAL LOW (ref 11.2–16.0)
MCV: 79 fL (ref 75–100)
Platelets: 384 THOU/uL (ref 150–450)
RBC: 3.9 MIL/uL — ABNORMAL LOW (ref 4.0–5.5)
RDW: 21.2 % — ABNORMAL HIGH (ref 0.0–15.0)
WBC: 13.5 THOU/uL — ABNORMAL HIGH (ref 3.5–11.0)

## 2023-12-08 LAB — PHOSPHORUS: Phosphorus: 3 mg/dL (ref 2.7–4.5)

## 2023-12-08 LAB — COVID/INFLUENZA A & B/RSV NAAT (PCR)
COVID-19 NAAT (PCR): NEGATIVE
Influenza A NAAT (PCR): NEGATIVE
Influenza B NAAT (PCR): NEGATIVE
RSV NAAT (PCR): NEGATIVE

## 2023-12-08 LAB — TRANSFERRIN: Transferrin: 329 mg/dL (ref 200–360)

## 2023-12-08 LAB — BASIC METABOLIC PANEL
Anion Gap: 11 (ref 7–16)
CO2: 30 mmol/L — ABNORMAL HIGH (ref 20–28)
Calcium: 9.3 mg/dL (ref 8.8–10.2)
Chloride: 100 mmol/L (ref 96–108)
Creatinine: 0.94 mg/dL (ref 0.51–0.95)
Glucose: 122 mg/dL — ABNORMAL HIGH (ref 60–99)
Lab: 14 mg/dL (ref 6–20)
Potassium: 4.1 mmol/L (ref 3.3–5.1)
Sodium: 141 mmol/L (ref 133–145)
eGFR BY CREAT: 79

## 2023-12-08 LAB — POCT GLUCOSE
Glucose POCT: 116 mg/dL — ABNORMAL HIGH (ref 60–99)
Glucose POCT: 126 mg/dL — ABNORMAL HIGH (ref 60–99)

## 2023-12-08 LAB — TIBC
Iron: 22 ug/dL — ABNORMAL LOW (ref 34–165)
TIBC: 444 ug/dL (ref 250–450)
Transferrin Saturation: 5 % — ABNORMAL LOW (ref 15–50)

## 2023-12-08 LAB — FERRITIN: Ferritin: 13 ng/mL (ref 10–120)

## 2023-12-08 LAB — MAGNESIUM: Magnesium: 1.9 mg/dL (ref 1.6–2.5)

## 2023-12-08 LAB — HEMOGLOBIN A1C: Hemoglobin A1C: 5.6 % (ref <=5.6)

## 2023-12-08 MED ORDER — ENOXAPARIN SODIUM 40 MG/0.4ML IJ SOSY *I*
40.0000 mg | PREFILLED_SYRINGE | Freq: Two times a day (BID) | INTRAMUSCULAR | Status: DC
Start: 2023-12-08 — End: 2024-02-06
  Administered 2023-12-08 – 2023-12-09 (×2): 40 mg via SUBCUTANEOUS
  Filled 2023-12-08 (×2): qty 0.4

## 2023-12-08 MED ORDER — CARVEDILOL 12.5 MG PO TABS *I*
12.5000 mg | ORAL_TABLET | Freq: Two times a day (BID) | ORAL | Status: DC
Start: 2023-12-08 — End: 2024-02-06
  Administered 2023-12-08 – 2023-12-09 (×3): 12.5 mg via ORAL
  Filled 2023-12-08 (×3): qty 1

## 2023-12-08 MED ORDER — IRON SUCROSE 20 MG/ML IV SOLN *I*
200.0000 mg | INTRAVENOUS | Status: DC
Start: 2023-12-08 — End: 2023-12-13
  Administered 2023-12-08 – 2023-12-09 (×2): 200 mg via INTRAVENOUS
  Filled 2023-12-08 (×2): qty 10

## 2023-12-08 MED ORDER — SENNOSIDES 8.6 MG PO TABS *I*
1.0000 | ORAL_TABLET | Freq: Every evening | ORAL | Status: DC
Start: 2023-12-08 — End: 2023-12-08
  Administered 2023-12-08: 1 via ORAL
  Filled 2023-12-08: qty 1

## 2023-12-08 MED ORDER — ALBUTEROL SULFATE (2.5 MG/3ML) 0.083% IN NEBU *I*
2.5000 mg | INHALATION_SOLUTION | Freq: Four times a day (QID) | RESPIRATORY_TRACT | Status: DC | PRN
Start: 2023-12-08 — End: 2024-02-06
  Administered 2023-12-09: 2.5 mg via RESPIRATORY_TRACT
  Filled 2023-12-08: qty 3

## 2023-12-08 MED ORDER — ACETAMINOPHEN 325 MG PO TABS *I*
650.0000 mg | ORAL_TABLET | Freq: Four times a day (QID) | ORAL | Status: DC | PRN
Start: 2023-12-08 — End: 2023-12-09
  Administered 2023-12-09: 650 mg via ORAL
  Filled 2023-12-08: qty 2

## 2023-12-08 MED ORDER — FUROSEMIDE 10 MG/ML IJ SOLN *I*
40.0000 mg | Freq: Once | INTRAMUSCULAR | Status: AC
Start: 2023-12-08 — End: 2023-12-08
  Administered 2023-12-08: 40 mg via INTRAVENOUS
  Filled 2023-12-08 (×2): qty 4

## 2023-12-08 MED ORDER — DEXTROSE 5 % FLUSH FOR PUMPS *I*
0.0000 mL/h | INTRAVENOUS | Status: DC | PRN
Start: 2023-12-08 — End: 2024-02-06

## 2023-12-08 MED ORDER — IPRATROPIUM BROMIDE 0.02 % IN SOLN *I*
500.0000 ug | Freq: Four times a day (QID) | RESPIRATORY_TRACT | Status: DC
Start: 2023-12-08 — End: 2024-01-07
  Administered 2023-12-08 – 2023-12-09 (×5): 500 ug via RESPIRATORY_TRACT
  Filled 2023-12-08 (×5): qty 2.5

## 2023-12-08 MED ORDER — POLYETHYLENE GLYCOL 3350 PO PACK 17 GM *I*
17.0000 g | PACK | Freq: Every day | ORAL | Status: DC
Start: 2023-12-08 — End: 2024-02-06
  Administered 2023-12-08: 17 g via ORAL
  Filled 2023-12-08: qty 17

## 2023-12-08 MED ORDER — PANTOPRAZOLE SODIUM 40 MG PO TBEC *I*
40.0000 mg | DELAYED_RELEASE_TABLET | Freq: Every day | ORAL | Status: DC
Start: 2023-12-08 — End: 2024-02-06
  Administered 2023-12-08 – 2023-12-09 (×2): 40 mg via ORAL
  Filled 2023-12-08 (×2): qty 1

## 2023-12-08 MED ORDER — LOSARTAN POTASSIUM 50 MG PO TABS *I*
50.0000 mg | ORAL_TABLET | Freq: Every day | ORAL | Status: DC
Start: 2023-12-08 — End: 2024-02-06
  Administered 2023-12-08 – 2023-12-09 (×2): 50 mg via ORAL
  Filled 2023-12-08 (×2): qty 1

## 2023-12-08 MED ORDER — SODIUM CHLORIDE 0.9 % FLUSH FOR PUMPS *I*
0.0000 mL/h | INTRAVENOUS | Status: DC | PRN
Start: 2023-12-08 — End: 2024-02-06

## 2023-12-08 MED ORDER — TORSEMIDE 20 MG PO TABS *I*
20.0000 mg | ORAL_TABLET | Freq: Every day | ORAL | Status: DC
Start: 2023-12-08 — End: 2024-02-06
  Administered 2023-12-08 – 2023-12-09 (×2): 20 mg via ORAL
  Filled 2023-12-08 (×2): qty 1

## 2023-12-08 MED ORDER — SENNOSIDES 8.6 MG PO TABS *I*
2.0000 | ORAL_TABLET | Freq: Two times a day (BID) | ORAL | Status: DC
Start: 2023-12-08 — End: 2024-01-07
  Administered 2023-12-09: 2 via ORAL
  Filled 2023-12-08: qty 2

## 2023-12-08 MED ORDER — BUDESONIDE-FORMOTEROL FUMARATE 160-4.5 MCG/ACT IN AERO *I*
2.0000 | INHALATION_SPRAY | Freq: Two times a day (BID) | RESPIRATORY_TRACT | Status: DC
Start: 2023-12-08 — End: 2024-02-06
  Administered 2023-12-08 – 2023-12-09 (×3): 2 via RESPIRATORY_TRACT
  Filled 2023-12-08: qty 6

## 2023-12-08 MED ORDER — TORSEMIDE 10 MG PO TABS *I*
5.0000 mg | ORAL_TABLET | Freq: Every day | ORAL | Status: DC
Start: 2023-12-08 — End: 2023-12-08

## 2023-12-08 MED ORDER — IPRATROPIUM-ALBUTEROL 0.5-2.5 MG/3ML IN SOLN *I*
3.0000 mL | Freq: Four times a day (QID) | RESPIRATORY_TRACT | Status: DC | PRN
Start: 2023-12-08 — End: 2023-12-08

## 2023-12-08 NOTE — ED Notes (Signed)
 Report Given ToHanna & Scout, RN Descriptive Sentence / Reason for Admission Pt reports out of all medications for the past five days. Today with cough, generalized fatigue, weakness, subjective fevers/chills. EMS reports SpO2 drops to low 90's after coughing fits, placed on 4L O2. Reports chest pain, SOB only after coughing. +BLE edema. Hx asthma, COPD, HTN. EKG in triage. Active Issues / Relevant Events FULL CODE A&Ox4AmbulatoryTrops 16->14 CXR: Mild pulmonary edema. Mild cardiomegaly To Do ListVS/AMeds per MARPain ManagementLabs as ordered Anticipatory Guidance / Discharge PlanningAdmit for CHF exacerbation

## 2023-12-08 NOTE — Progress Notes (Signed)
 FOLLOW-UP TO PREVIOUS VERBAL NOTIFICATION:Observation LOC Notification  delivered on 12/08/23 at 1534.  Acknowledgement of receipt of form  signed by Patient . Copy of notice provided to patient, and original scanned into the chart of this encounter.  Lynwood Side RN UMUtilization ManagementSecure Chat or Ext [phone]

## 2023-12-08 NOTE — Progress Notes (Addendum)
 Phone call to DSS housing [phone]) (2:49pm) who states they did not receive the checklist. DSS reports that the pt will need to call back after hours at this point. She will also need to contact [facility] due to the situation. SW met again with the pt who states she was not able to get through to [facility]. Alerted her she would need to call again prior to contacting DSS. She was sleeping and writer is not sure she was understanding. Plan : will give sign out to evening SW for follow up. Randine Sharps, LMSWEmergency Department Social [phone] or chat message Pager ID [phone]

## 2023-12-08 NOTE — Progress Notes (Signed)
 Physical Therapy:Received order for PT consult. Per provider and nursing, pt independently ambulating in hospital environment.  No apparent acute PT needs at this time, will discontinue order. Thank you.Brad Sable, PT, DPTPlease contact physical therapist on the treatment team via secure chat or via the secure chat group for your unit Physical Therapist with any questions.On weekends/holidays, please utilize the secure chat group, [facility] Physical Therapy 1st call, to contact PT.

## 2023-12-08 NOTE — Progress Notes (Addendum)
 12/08/23 1050 UM Patient Class Review Patient Class Review Observation Patient class effective as of 12/08/23. Writer reviewed Observation LOC Notification  with Patient on 12/08/23 at the time of 1054 via ed nurse phone at bedside. This was a verbal notification & a UM RN will be following up to deliver the physical notification form reviewed. A signature of receipt will be required at that time.  If the patient or their representative have additional questions after notification, they have been provided their Utilization Management's contact information, at the time of verbal notification & is also included on the written form once delivered. Rosina Hopes RN Utilization ManagementSecure chat or [phone]
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
## 2023-12-08 NOTE — ED Notes (Signed)
 Report Given Evone Search, & Brooke, RNDescriptive Sentence / Reason for Admission Pt reports out of all medications for the past five days. Today with cough, generalized fatigue, weakness, subjective fevers/chills. EMS reports SpO2 drops to low 90's after coughing fits, placed on 4L O2. Reports chest pain, SOB only after coughing. +BLE edema. Hx asthma, COPD, HTN. EKG in triage. Active Issues / Relevant Events A&Ox4AmbulatoryTrops 16->14 (Delta -2)WBC 14.6CXR: Mild pulmonary edema. Mild cardiomegaly To Do ListVS/AMeds per MARPain ManagementLabs as ordered Anticipatory Guidance / Discharge PlanningAdmit- CHF exacerbation

## 2023-12-08 NOTE — Progress Notes (Signed)
 Attempted to see the pt for consult order, but she was sleeping.2nd attempt: with a provider. Will continue attempts.Randine Sharps, LMSWEmergency Department Social [phone] or chat message Pager ID [phone]

## 2023-12-08 NOTE — ED Notes (Signed)
 Patient OOB and ambulatory to restroom. Tolerated well.
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
## 2023-12-08 NOTE — Bed Hold Note (Signed)
 Bed: AC-HA09  Expected date:   Expected time:   Means of arrival:   Comments:  WR
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
## 2023-12-08 NOTE — ED Notes (Addendum)
 Pt stated that she was going to walk down to the cafeteria. Meds admin late d/t patient not being on unit. Pt ambulated self to cafeteria and back independently.

## 2023-12-08 NOTE — Progress Notes (Signed)
 SW spoke to [name] at DSS regarding the check list for the pt. Jenna [name] was able to speak to the pt and place her at [facility] and their curfew is 6pm. This Clinical research associate re-faxed an updated checklist to DSS. This Insurance underwriter the team in regards to discharge planning. [name], [name] reported that the pt needs a PCP referral and wanted to set her up with on near the shelter she was placed. It was reported to be too late in the day for a PCP referral. PA wants to keep the pt another night so the pt can be discharged with a PCP. This Clinical research associate called DSS and canceled the placement for tonight. Jenna [name] from DSS reported that a new check list will need to be sent tomorrow to start the process over again. [name], LMSWEmergency Department Social [phone]

## 2023-12-08 NOTE — ED Notes (Signed)
Pt ambulated to bathroom & back independently.

## 2023-12-09 ENCOUNTER — Other Ambulatory Visit: Payer: Self-pay

## 2023-12-09 DIAGNOSIS — R0600 Dyspnea, unspecified: Secondary | ICD-10-CM

## 2023-12-09 DIAGNOSIS — J4550 Severe persistent asthma, uncomplicated: Secondary | ICD-10-CM

## 2023-12-09 LAB — CBC
Hematocrit: 30 % — ABNORMAL LOW (ref 34–49)
Hemoglobin: 8.1 g/dL — ABNORMAL LOW (ref 11.2–16.0)
MCV: 78 fL (ref 75–100)
RBC: 3.9 MIL/uL — ABNORMAL LOW (ref 4.0–5.5)
RDW: 21.3 % — ABNORMAL HIGH (ref 0.0–15.0)
WBC: 15.5 THOU/uL — ABNORMAL HIGH (ref 3.5–11.0)

## 2023-12-09 LAB — BASIC METABOLIC PANEL
Anion Gap: 7 (ref 7–16)
CO2: 31 mmol/L — ABNORMAL HIGH (ref 20–28)
Calcium: 9 mg/dL (ref 8.8–10.2)
Chloride: 100 mmol/L (ref 96–108)
Creatinine: 0.84 mg/dL (ref 0.51–0.95)
Glucose: 127 mg/dL — ABNORMAL HIGH (ref 60–99)
Lab: 14 mg/dL (ref 6–20)
Potassium: 4.5 mmol/L (ref 3.3–5.1)
Sodium: 138 mmol/L (ref 133–145)
eGFR BY CREAT: 91

## 2023-12-09 LAB — PHOSPHORUS: Phosphorus: 3.4 mg/dL (ref 2.7–4.5)

## 2023-12-09 LAB — MAGNESIUM: Magnesium: 1.9 mg/dL (ref 1.6–2.5)

## 2023-12-09 MED ORDER — SENNOSIDES 8.6 MG PO TABS *I*
2.0000 | ORAL_TABLET | Freq: Every day | ORAL | 0 refills | Status: AC
Start: 2023-12-09 — End: 2024-01-11
  Filled 2023-12-09: qty 60, 30d supply, fill #0

## 2023-12-09 MED ORDER — FERROUS SULFATE 325 (65 FE) MG PO TABS *WRAPPED* *I*
324.0000 mg | ORAL_TABLET | ORAL | 0 refills | Status: AC
Start: 2023-12-09 — End: 2024-01-07
  Filled 2023-12-09: qty 15, 30d supply, fill #0

## 2023-12-09 MED ORDER — ALBUTEROL SULFATE HFA 108 (90 BASE) MCG/ACT IN AERS *I*
1.0000 | INHALATION_SPRAY | Freq: Four times a day (QID) | RESPIRATORY_TRACT | 0 refills | Status: DC | PRN
  Filled 2023-12-09: qty 18, 25d supply, fill #0

## 2023-12-09 MED ORDER — GUAIFENESIN 600 MG PO TB12 *I*
600.0000 mg | ORAL_TABLET | Freq: Two times a day (BID) | ORAL | 0 refills | Status: AC
Start: 2023-12-09 — End: 2023-12-26
  Filled 2023-12-09: qty 28, 14d supply, fill #0

## 2023-12-09 MED ORDER — HYDROXYZINE HCL 10 MG PO TABS *I*
10.0000 mg | ORAL_TABLET | Freq: Three times a day (TID) | ORAL | Status: DC | PRN
Start: 2023-12-09 — End: 2023-12-09
  Administered 2023-12-09: 10 mg via ORAL
  Filled 2023-12-09: qty 1

## 2023-12-09 MED ORDER — POLYETHYLENE GLYCOL 3350 PO POWD *I*
17.0000 g | Freq: Every day | ORAL | 0 refills | Status: AC
Start: 2023-12-10 — End: 2024-01-11
  Filled 2023-12-09: qty 510, 30d supply, fill #0

## 2023-12-09 MED ORDER — LOSARTAN POTASSIUM 50 MG PO TABS *I*
50.0000 mg | ORAL_TABLET | Freq: Every day | ORAL | 0 refills | Status: DC
Start: 2023-12-09 — End: 2024-02-01
  Filled 2023-12-09: qty 30, 30d supply, fill #0

## 2023-12-09 MED ORDER — ACETAMINOPHEN 325 MG PO TABS *I*
650.0000 mg | ORAL_TABLET | Freq: Four times a day (QID) | ORAL | 0 refills | Status: AC | PRN
Start: 2023-12-09 — End: 2024-12-08
  Filled 2023-12-09: qty 100, 12d supply, fill #0

## 2023-12-09 MED ORDER — BENZONATATE 200 MG PO CAPS *I*
200.0000 mg | ORAL_CAPSULE | Freq: Three times a day (TID) | ORAL | 0 refills | Status: AC | PRN
Start: 2023-12-09 — End: ?
  Filled 2023-12-09: qty 15, 5d supply, fill #0

## 2023-12-09 MED ORDER — PANTOPRAZOLE SODIUM 40 MG PO TBEC *I*
40.0000 mg | DELAYED_RELEASE_TABLET | Freq: Every day | ORAL | 0 refills | Status: AC
Start: 2023-12-09 — End: 2024-01-08
  Filled 2023-12-09: qty 30, 30d supply, fill #0

## 2023-12-09 MED ORDER — BUDESONIDE-FORMOTEROL FUMARATE 160-4.5 MCG/ACT IN AERO *I*
2.0000 | INHALATION_SPRAY | Freq: Two times a day (BID) | RESPIRATORY_TRACT | 0 refills | Status: AC
Start: 2023-12-09 — End: 2024-01-08
  Filled 2023-12-09: qty 1, 30d supply, fill #0

## 2023-12-09 MED ORDER — TORSEMIDE 20 MG PO TABS *I*
20.0000 mg | ORAL_TABLET | Freq: Every day | ORAL | 0 refills | Status: DC
Start: 2023-12-09 — End: 2024-02-01
  Filled 2023-12-09: qty 30, 30d supply, fill #0

## 2023-12-09 MED ORDER — CARVEDILOL 6.25 MG PO TABS *I*
12.5000 mg | ORAL_TABLET | Freq: Two times a day (BID) | ORAL | 0 refills | Status: AC
Start: 2023-12-09 — End: 2024-01-11
  Filled 2023-12-09: qty 120, 30d supply, fill #0

## 2023-12-09 MED ORDER — GUAIFENESIN 600 MG PO TB12 *I*
600.0000 mg | ORAL_TABLET | Freq: Two times a day (BID) | ORAL | Status: DC
Start: 2023-12-09 — End: 2023-12-09
  Administered 2023-12-09: 600 mg via ORAL
  Filled 2023-12-09: qty 1

## 2023-12-09 NOTE — Progress Notes (Addendum)
 SW given sign out about pt needing housing. [facility] chart review [facility] but SW cancelled because PA wanted to keep pt to connect her to PCP. SW attempted to contact DSS three times this morning with no answer. SW will continue to reach out to DSS. Addendum added 8:30AM: SW contacted DSS speaking with [name] took down pt's information and said that he will work on getting her placed for the weekend. SW will await to hear from [place] about where pt is placed. [name] called back immediately and said that pt was placed at [facility], [address]  until Monday and pt has to follow up with DSS. SW met with pt to let her know of placement. [facility] will set up transport for pt. Rella Egelston, LMSW Emergency Department Social Work [phone]

## 2023-12-09 NOTE — Discharge Summary (Signed)
 Name: Jenna [name] MRN: [id number] DOB: [birth year]   Admit Date: 12/08/2023 Date of Discharge: 12/09/2023 Patient was accepted for discharge to Home or Self Care [1] Discharge Attending Physician: Forrestine Bread, MDHospitalization SummaryConcise Narrative: 38 yo female with HFpEF, asthma/COPD, chronic anemia due to menorrhagia, and depression who presents with cough with associated chest pain, SOB, and worsened LE edema. Recent admission to OSH with CHF exacerbation, treated with diuresis as well as steroids for possible asthma/COPD exacerbation, and sent to shelter on discharge without medications. Resumed carvedilol , losartan , and bronchodilator regimen.  Torsemide  increased to 20mg  daily.  Given IV iron  x 2 doses and started on oral iron  for iron  deficiency anemia due to menorrhagia.  Arranged PCP follow-up, recommend outpatient sleep study to evaluate for possible OSA.  SW consulted and assisted with housing.              Significant Med Changes: YesIncreased torsemide  to 20mg  daily.     Signed: [name] Rubenstein, PA  On: 12/09/2023  at: 11:40 AM
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
## 2023-12-09 NOTE — Discharge Instructions (Addendum)
 Your diagnosis was: acute on chronic heart failure in setting of running out of home medicationsWhat was done: - diuretics increased - social work assisted with temporary housing - medications refilledWhat do I need to do: - follow up with primary care provider.  Please see appointment in packet.  Recommended diet: Low SodiumRecommended activity: activity as toleratedLabs:  per primary care providerIf you experience any of these symptoms 24 hours or more after discharge:Uncontrolled pain, Chest pain, Shortness of breath, Fever of 101 F. or greater, Chills, Poor appetite, Poor urinary output, Vomiting, Nausea, Diarrhea, Blood in stool or Weakness  Call Janell Geralds, NP at [phone]   If you experience any of these symptoms within the first 24 hours after discharge call Dr.  Trumbo at [phone] can increase your chances of developing chronic health problems or worsen conditions you already have.  If you smoke you should quit. Smoking cessation information has been given to you for your review to help you quit.  Medications to help you quit are available.  Ask your doctor if you would like to receive these medications. MedicationsYour doctor has prescribed medications to improve or manage your condition.  You should take them as prescribed by your doctor.  Ask your doctor for any questions regarding these medications.DietA healthy diet is important to help you stay well.  Some health conditions require you to be on a special diet.  You should monitor your fluid intake and limit the amount of sodium including table salt.  This will help you avoid fluid retention which can cause shortness of breath or swelling of the feet and ankles.  Reading food labels is helpful when you are on a special diet.  Follow instructions from your doctor for any other special dietary requirement.Exercise/ActivityActivity and exercise are important  to your well being.  While you are in the hospital your activity may be restricted.  As your condition improves your activity level will be increased.  Most patients will be able to gradually resume activity as before.  You should follow your doctor's activity recommendations  Daily WeightYou will need to weigh yourself every day at the same time on the same scale, preferably after you empty your bladder.  If you have an increase of 3 pounds in 2 days or 5 pounds in 1 week you should contact your physician.  A daily written log of your weights is a good way to keep track.  You should follow your doctor's recommendations on monitoring your weight. What to do if your condition changes?If at any time you have any questions or concerns or your condition gets worse, contact your physician.  If you can not reach your physician or you develop life threatening symptoms such as trouble breathing or chest pain you should go to the closest Emergency Department.

## 2023-12-09 NOTE — ED Notes (Signed)
 Report Given ToRN Descriptive Sentence / Reason for Admission Pt reports out of all medications for the past five days. Today with cough, generalized fatigue, weakness, subjective fevers/chills. EMS reports SpO2 drops to low 90's after coughing fits, placed on 4L O2. Reports chest pain, SOB only after coughing. +BLE edema. Hx asthma, COPD, HTN. EKG in triage. Active Issues / Relevant Events FULL CODE A&Ox4, Ambulatory, DOEBehavioral at timesTrops 16->14 CXR: Mild pulmonary edema. Mild cardiomegaly To Do ListVS Q4Assess Q4Meds per MARPain managementAnticipatory Guidance / Discharge PlanningAdmit for CHF exacerbation, SW placement

## 2023-12-09 NOTE — Progress Notes (Signed)
 12/09/23 0800 UM Patient Class Review Patient Class Review Observation Patient class effective 9/25/25Sharon Dasie RNUtilization Management DepartmentStrong [facility] phone # [phone] via secure chat

## 2023-12-10 ENCOUNTER — Other Ambulatory Visit: Payer: Self-pay

## 2023-12-12 ENCOUNTER — Other Ambulatory Visit: Payer: Self-pay

## 2023-12-20 ENCOUNTER — Ambulatory Visit: Admitting: Primary Care

## 2023-12-20 ENCOUNTER — Other Ambulatory Visit: Payer: Self-pay

## 2023-12-20 ENCOUNTER — Encounter: Payer: Self-pay | Admitting: Gastroenterology

## 2023-12-21 ENCOUNTER — Other Ambulatory Visit: Payer: Self-pay

## 2023-12-21 LAB — EKG 12-LEAD
P: 58 deg
PR: 150 ms
QRS: 21 deg
QRSD: 97 ms
QT: 361 ms
QTc: 456 ms
Rate: 95 {beats}/min
T: 8 deg

## 2023-12-23 ENCOUNTER — Encounter: Payer: Self-pay | Admitting: Internal Medicine

## 2023-12-27 ENCOUNTER — Ambulatory Visit: Admitting: Primary Care

## 2023-12-30 ENCOUNTER — Other Ambulatory Visit: Payer: Self-pay | Admitting: Medical Genetics

## 2023-12-30 DIAGNOSIS — Z006 Encounter for examination for normal comparison and control in clinical research program: Secondary | ICD-10-CM

## 2024-01-09 ENCOUNTER — Encounter: Payer: MEDICAID | Admitting: Family

## 2024-01-20 ENCOUNTER — Ambulatory Visit

## 2024-01-30 ENCOUNTER — Emergency Department
Admission: EM | Admit: 2024-01-30 | Discharge: 2024-01-30 | Discharge status: Against Medical Advice | Source: Ambulatory Visit | Attending: Emergency Medicine | Admitting: Emergency Medicine

## 2024-01-30 DIAGNOSIS — I517 Cardiomegaly: Secondary | ICD-10-CM

## 2024-01-30 DIAGNOSIS — Z789 Other specified health status: Secondary | ICD-10-CM

## 2024-01-30 DIAGNOSIS — Z5321 Procedure and treatment not carried out due to patient leaving prior to being seen by health care provider: Secondary | ICD-10-CM | POA: Insufficient documentation

## 2024-01-30 DIAGNOSIS — R9431 Abnormal electrocardiogram [ECG] [EKG]: Secondary | ICD-10-CM

## 2024-01-30 NOTE — ED Triage Notes (Signed)
 C/o persistent chest pain x 2 weeks +SOB. Hx of CHF, states she has not taken her meds for 2 months. Denies dizziness, n/v/d, fevers. EKG in triage  Prehospital medications given: No

## 2024-02-01 ENCOUNTER — Emergency Department

## 2024-02-01 ENCOUNTER — Other Ambulatory Visit: Payer: Self-pay

## 2024-02-01 ENCOUNTER — Emergency Department
Admission: EM | Admit: 2024-02-01 | Discharge: 2024-02-01 | Disposition: A | Discharge status: Home / Self Care | Source: Ambulatory Visit | Attending: Emergency Medicine | Admitting: Emergency Medicine

## 2024-02-01 ENCOUNTER — Institutional Professional Consult (permissible substitution) (INDEPENDENT_AMBULATORY_CARE_PROVIDER_SITE_OTHER): Payer: MEDICAID | Admitting: Physician Assistant

## 2024-02-01 ENCOUNTER — Ambulatory Visit (INDEPENDENT_AMBULATORY_CARE_PROVIDER_SITE_OTHER): Payer: MEDICAID | Admitting: Audiology

## 2024-02-01 DIAGNOSIS — R079 Chest pain, unspecified: Secondary | ICD-10-CM

## 2024-02-01 DIAGNOSIS — I517 Cardiomegaly: Secondary | ICD-10-CM

## 2024-02-01 DIAGNOSIS — I509 Heart failure, unspecified: Secondary | ICD-10-CM | POA: Insufficient documentation

## 2024-02-01 DIAGNOSIS — J449 Chronic obstructive pulmonary disease, unspecified: Secondary | ICD-10-CM

## 2024-02-01 DIAGNOSIS — I5033 Acute on chronic diastolic (congestive) heart failure: Secondary | ICD-10-CM

## 2024-02-01 DIAGNOSIS — F17210 Nicotine dependence, cigarettes, uncomplicated: Secondary | ICD-10-CM | POA: Insufficient documentation

## 2024-02-01 DIAGNOSIS — Z789 Other specified health status: Secondary | ICD-10-CM

## 2024-02-01 DIAGNOSIS — R7989 Other specified abnormal findings of blood chemistry: Secondary | ICD-10-CM | POA: Insufficient documentation

## 2024-02-01 DIAGNOSIS — Z5902 Unsheltered unhousedness: Secondary | ICD-10-CM | POA: Insufficient documentation

## 2024-02-01 LAB — NT-PRO BNP: NT-pro BNP: 133 pg/mL (ref 0–450)

## 2024-02-01 LAB — RUQ PANEL (ED ONLY)
ALT: 15 U/L (ref 0–35)
AST: 18 U/L (ref 0–35)
Albumin: 4.2 g/dL (ref 3.5–5.2)
Alk Phos: 92 U/L (ref 35–105)
Amylase: 46 U/L (ref 28–100)
Bilirubin,Direct: <0.2 mg/dL (ref 0.0–0.3)
Bilirubin,Total: 0.3 mg/dL (ref 0.0–1.2)
Lipase: 32 U/L (ref 13–60)
Total Protein: 7.1 g/dL (ref 6.3–7.7)

## 2024-02-01 LAB — CBC AND DIFFERENTIAL
Baso # K/uL: 0 THOU/uL (ref 0.0–0.2)
Eos # K/uL: 0.1 THOU/uL (ref 0.0–0.5)
Hematocrit: 31 % — ABNORMAL LOW (ref 34–49)
Hemoglobin: 8.4 g/dL — ABNORMAL LOW (ref 11.2–16.0)
IMM Granulocytes #: 0.1 THOU/uL — ABNORMAL HIGH (ref 0–0)
IMM Granulocytes: 0.7 %
Lymph # K/uL: 2.7 THOU/uL (ref 1.0–5.0)
MCV: 79 fL (ref 75–100)
Mono # K/uL: 1.1 THOU/uL — ABNORMAL HIGH (ref 0.1–1.0)
Neut # K/uL: 9.4 THOU/uL — ABNORMAL HIGH (ref 1.5–6.5)
Nucl RBC # K/uL: 0 THOU/uL (ref 0.0–0.1)
Nucl RBC %: 0 /100{WBCs} (ref 0.0–0.2)
Platelets: 366 THOU/uL (ref 150–450)
RBC: 4 MIL/uL (ref 4.0–5.5)
RDW: 19.8 % — ABNORMAL HIGH (ref 0.0–15.0)
Seg Neut %: 70.2 %
WBC: 13.3 THOU/uL — ABNORMAL HIGH (ref 3.5–11.0)

## 2024-02-01 LAB — BASIC METABOLIC PANEL
Anion Gap: 8 (ref 7–16)
CO2: 29 mmol/L — ABNORMAL HIGH (ref 20–28)
Calcium: 9.5 mg/dL (ref 8.8–10.2)
Chloride: 103 mmol/L (ref 96–108)
Creatinine: 0.87 mg/dL (ref 0.51–0.95)
Glucose: 130 mg/dL — ABNORMAL HIGH (ref 60–99)
Lab: 16 mg/dL (ref 6–20)
Potassium: 4.1 mmol/L (ref 3.3–5.1)
Sodium: 140 mmol/L (ref 133–145)
eGFR BY CREAT: 87

## 2024-02-01 LAB — TROPONIN T 3 HR W/ DELTA HIGH SENSITIVITY (IP/ED ONLY)
TROP T 0-3 HR DELTA High Sensitivity: 2 (ref 0–4)
TROP T 3 HR High Sensitivity: 16 ng/L — ABNORMAL HIGH (ref 0–14)

## 2024-02-01 LAB — TROPONIN T 1 HR W/ DELTA HIGH SENSITIVITY
TROP T 0-1 HR DELTA High Sensitivity: 6 — ABNORMAL HIGH (ref 0–2)
TROP T 1 HR High Sensitivity: 20 ng/L — ABNORMAL HIGH (ref 0–11)

## 2024-02-01 LAB — TROPONIN T 0 HR HIGH SENSITIVITY (IP/ED ONLY): TROP T 0 HR High Sensitivity: 14 ng/L — ABNORMAL HIGH (ref 0–11)

## 2024-02-01 LAB — HOLD BLUE

## 2024-02-01 MED ORDER — TORSEMIDE 20 MG PO TABS *I*
20.0000 mg | ORAL_TABLET | Freq: Every day | ORAL | Status: DC
Start: 2024-02-01 — End: 2024-02-01

## 2024-02-01 MED ORDER — TORSEMIDE 20 MG PO TABS *I*
20.0000 mg | ORAL_TABLET | Freq: Once | ORAL | Status: AC
Start: 2024-02-01 — End: 2024-02-01
  Administered 2024-02-01: 20 mg via ORAL
  Filled 2024-02-01: qty 1

## 2024-02-01 MED ORDER — TORSEMIDE 20 MG PO TABS *I*
20.0000 mg | ORAL_TABLET | Freq: Every morning | ORAL | 0 refills | Status: DC
  Filled 2024-02-01: qty 90, 90d supply, fill #0

## 2024-02-01 MED ORDER — CARVEDILOL 12.5 MG PO TABS *I*
12.5000 mg | ORAL_TABLET | Freq: Two times a day (BID) | ORAL | 0 refills | Status: DC
  Filled 2024-02-01: qty 180, 90d supply, fill #0

## 2024-02-01 MED ORDER — LOSARTAN POTASSIUM 50 MG PO TABS *I*
50.0000 mg | ORAL_TABLET | Freq: Every day | ORAL | 0 refills | Status: DC
  Filled 2024-02-01: qty 90, 90d supply, fill #0

## 2024-02-01 NOTE — Progress Notes (Signed)
 SW notified patient is homeless. SW spoke with [name] at [facility] [phone] who reports patient is not sanctioned but has high use of after hours for emergency housing and needs to present in person for placement. SW notified nurse to get patient cab to [address] [place]. SW spoke with pharmacy who confirmed patient does not have a copay for meds.Rosina Gunner, LMSWED Senior Social [phone] or [phone]

## 2024-02-01 NOTE — ED Notes (Signed)
 The pt is A&OX4, and able to make needs known.  The pt denies any cp, but states she thinks her sob is from her chf, and d/t her not taking any of her meds.  The pt is in NAD.

## 2024-02-01 NOTE — ED Triage Notes (Signed)
 Pt via EMS w/ c/o shortness of breath w/ dyspnea on exertion, chest pain, nausea & generalized abd pain over past 24hrs. Stated is homeless & would like to speak w/ SW regarding resources. Denies lightheadedness/dizziness, numbness/tingling, urinary or bowel symptoms.  Prehospital medications given: No

## 2024-02-01 NOTE — ED Provider Progress Notes (Signed)
 ED Provider Progress NoteReceived in signout.65F hx CHF, NICM, homeless, has been out of meds for 1-2 months, presenting w/ edema and SOB.  0 and 1 hr troponins mildly uptrending, awaiting 3hr.  Got home dose torsemide .  Pending repeat troponin and reassessment after diuresis.  Prentice Collie, MD, 02/01/2024, 7:08 AM Collie Prentice, MD11/19/25 0710

## 2024-02-01 NOTE — Progress Notes (Signed)
 [facility] Emergency Department Social Work Discharge NoteContacts: PatientDestination:DSS -  [address] Transportation:Mode of Transportation: TaxiVendor: Glass Blower/designer: [phone] pick-up time: ASAPPayor: Social Work VoucherNotifications:Met with patient: Market Researcher with family: NoInformed CHARITY FUNDRAISER (name): GeorgeNeed for Nursing Report:No - N/AErica Janelle SOLOMON Social [phone] or [phone]

## 2024-02-01 NOTE — Bed Hold Note (Signed)
Bed: AC-HD05  Expected date:   Expected time:   Means of arrival:   Comments:  wr
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
## 2024-02-01 NOTE — Discharge Instructions (Signed)
 To do after discharge- The primary care office will contact you for a follow up appointment- Take all your medications as prescribedReturn the Emergency Department if- Your breathing gets much worse- You have more chest pain- You have any other concerning symptoms or you feel unsafe at home

## 2024-02-05 LAB — EKG 12-LEAD
P: 74 deg
PR: 163 ms
QRS: 9 deg
QRSD: 103 ms
QT: 404 ms
QTc: 436 ms
Rate: 70 {beats}/min
T: -36 deg

## 2024-02-18 LAB — EKG 12-LEAD
P: 69 deg
PR: 152 ms
QRS: 2 deg
QRSD: 98 ms
QT: 390 ms
QTc: 439 ms
Rate: 76 {beats}/min
T: -15 deg

## 2024-03-02 ENCOUNTER — Other Ambulatory Visit: Payer: Self-pay

## 2024-03-02 ENCOUNTER — Emergency Department
Admission: EM | Admit: 2024-03-02 | Discharge: 2024-03-02 | Disposition: A | Discharge status: Home / Self Care | Source: Ambulatory Visit | Attending: Emergency Medicine | Admitting: Emergency Medicine

## 2024-03-02 ENCOUNTER — Emergency Department

## 2024-03-02 ENCOUNTER — Encounter: Payer: Self-pay | Admitting: Emergency Medicine

## 2024-03-02 DIAGNOSIS — J449 Chronic obstructive pulmonary disease, unspecified: Secondary | ICD-10-CM

## 2024-03-02 DIAGNOSIS — Z59819 Housing instability, housed unspecified: Secondary | ICD-10-CM | POA: Insufficient documentation

## 2024-03-02 DIAGNOSIS — R0602 Shortness of breath: Secondary | ICD-10-CM | POA: Insufficient documentation

## 2024-03-02 DIAGNOSIS — R058 Other specified cough: Secondary | ICD-10-CM | POA: Insufficient documentation

## 2024-03-02 DIAGNOSIS — Z630 Problems in relationship with spouse or partner: Secondary | ICD-10-CM | POA: Insufficient documentation

## 2024-03-02 DIAGNOSIS — R0689 Other abnormalities of breathing: Secondary | ICD-10-CM | POA: Insufficient documentation

## 2024-03-02 DIAGNOSIS — R600 Localized edema: Secondary | ICD-10-CM | POA: Insufficient documentation

## 2024-03-02 DIAGNOSIS — Z789 Other specified health status: Secondary | ICD-10-CM

## 2024-03-02 DIAGNOSIS — R0789 Other chest pain: Secondary | ICD-10-CM | POA: Insufficient documentation

## 2024-03-02 DIAGNOSIS — F17210 Nicotine dependence, cigarettes, uncomplicated: Secondary | ICD-10-CM | POA: Insufficient documentation

## 2024-03-02 DIAGNOSIS — I509 Heart failure, unspecified: Secondary | ICD-10-CM | POA: Insufficient documentation

## 2024-03-02 DIAGNOSIS — D6489 Other specified anemias: Secondary | ICD-10-CM | POA: Insufficient documentation

## 2024-03-02 DIAGNOSIS — I517 Cardiomegaly: Secondary | ICD-10-CM

## 2024-03-02 LAB — CBC AND DIFFERENTIAL

## 2024-03-02 LAB — BASIC METABOLIC PANEL
Anion Gap: 9 (ref 7–16)
CO2: 27 mmol/L (ref 20–28)
Calcium: 9.2 mg/dL (ref 8.8–10.2)
Chloride: 105 mmol/L (ref 96–108)
Creatinine: 0.96 mg/dL — ABNORMAL HIGH (ref 0.51–0.95)
Glucose: 113 mg/dL — ABNORMAL HIGH (ref 60–99)
Lab: 18 mg/dL (ref 6–20)
Potassium: 4.8 mmol/L (ref 3.3–5.1)
Sodium: 141 mmol/L (ref 133–145)
eGFR BY CREAT: 77

## 2024-03-02 LAB — MAGNESIUM: Magnesium: 2.1 mg/dL (ref 1.6–2.5)

## 2024-03-02 LAB — COVID/INFLUENZA A & B/RSV NAAT (PCR)
COVID-19 NAAT (PCR): NEGATIVE
Influenza A NAAT (PCR): NEGATIVE
Influenza B NAAT (PCR): NEGATIVE
RSV NAAT (PCR): NEGATIVE

## 2024-03-02 LAB — RUQ PANEL (ED ONLY)
ALT: 22 U/L (ref 0–35)
AST: 24 U/L (ref 0–35)
Albumin: 3.9 g/dL (ref 3.5–5.2)
Alk Phos: 95 U/L (ref 35–105)
Amylase: 43 U/L (ref 28–100)
Bilirubin,Direct: <0.2 mg/dL (ref 0.0–0.3)
Bilirubin,Total: <0.2 mg/dL (ref 0.0–1.2)
Lipase: 30 U/L (ref 13–60)
Total Protein: 6.8 g/dL (ref 6.3–7.7)

## 2024-03-02 LAB — VENOUS BLOOD GAS
Base Excess,VENOUS: 3 mmol/L — ABNORMAL HIGH (ref <=2)
Bicarbonate,VENOUS: 29 mmol/L — ABNORMAL HIGH (ref 21–28)
CO2 (Calc),VENOUS: 31 mmol/L (ref 22–31)
CO: 3.6 %
FO2 HB,VENOUS: 61 % — ABNORMAL LOW (ref 63–83)
Hemoglobin: 7.8 g/dL — ABNORMAL LOW (ref 11.2–16.0)
Methemoglobin: 0 % (ref 0.0–1.0)
PCO2,VENOUS: 52 mmHg — ABNORMAL HIGH (ref 40–50)
PH,VENOUS: 7.36 (ref 7.32–7.42)
PO2,VENOUS: 38 mmHg (ref 30–43)

## 2024-03-02 LAB — CBC
Hematocrit: 27 % — ABNORMAL LOW (ref 34–49)
Hemoglobin: 7.3 g/dL — ABNORMAL LOW (ref 11.2–16.0)
MCV: 78 fL (ref 75–100)
Platelets: 327 THOU/uL (ref 150–450)
RBC: 3.5 MIL/uL — ABNORMAL LOW (ref 4.0–5.5)
RDW: 18 % — ABNORMAL HIGH (ref 0.0–15.0)
WBC: 12.8 THOU/uL — ABNORMAL HIGH (ref 3.5–11.0)

## 2024-03-02 LAB — NT-PRO BNP: NT-pro BNP: 142 pg/mL (ref 0–450)

## 2024-03-02 LAB — TROPONIN T 1 HR W/ DELTA HIGH SENSITIVITY
TROP T 0-1 HR DELTA High Sensitivity: 2 (ref 0–2)
TROP T 1 HR High Sensitivity: 14 ng/L — ABNORMAL HIGH (ref 0–11)

## 2024-03-02 LAB — TROPONIN T 3 HR W/ DELTA HIGH SENSITIVITY (IP/ED ONLY)
TROP T 0-3 HR DELTA High Sensitivity: 0 (ref 0–4)
TROP T 3 HR High Sensitivity: 12 ng/L (ref 0–14)

## 2024-03-02 LAB — BLOOD BANK HOLD PINK

## 2024-03-02 LAB — PREGNANCY TEST, SERUM: Preg,Serum: NEGATIVE

## 2024-03-02 LAB — TROPONIN T 0 HR HIGH SENSITIVITY (IP/ED ONLY): TROP T 0 HR High Sensitivity: 12 ng/L — ABNORMAL HIGH (ref 0–11)

## 2024-03-02 MED ORDER — CARVEDILOL 12.5 MG PO TABS *I*
12.5000 mg | ORAL_TABLET | Freq: Two times a day (BID) | ORAL | 0 refills | Status: AC
  Filled 2024-03-02: qty 60, 30d supply, fill #0

## 2024-03-02 MED ORDER — BUDESONIDE-FORMOTEROL FUMARATE 160-4.5 MCG/ACT IN AERO *I*
2.0000 | INHALATION_SPRAY | Freq: Two times a day (BID) | RESPIRATORY_TRACT | 0 refills | Status: AC
  Filled 2024-03-02: qty 1, 30d supply, fill #0

## 2024-03-02 MED ORDER — TORSEMIDE 20 MG PO TABS *I*
20.0000 mg | ORAL_TABLET | Freq: Once | ORAL | Status: AC
  Administered 2024-03-02: 20 mg via ORAL
  Filled 2024-03-02: qty 1

## 2024-03-02 MED ORDER — TORSEMIDE 20 MG PO TABS *I*
20.0000 mg | ORAL_TABLET | Freq: Every morning | ORAL | 0 refills | Status: AC
  Filled 2024-03-02: qty 30, 30d supply, fill #0

## 2024-03-02 MED ORDER — CARVEDILOL 12.5 MG PO TABS *I*
12.5000 mg | ORAL_TABLET | Freq: Once | ORAL | Status: AC
  Administered 2024-03-02: 12.5 mg via ORAL
  Filled 2024-03-02: qty 1

## 2024-03-02 MED ORDER — DEXTROSE 5 % FLUSH FOR PUMPS *I*: 0.0000 mL/h | INTRAVENOUS | Status: DC | PRN

## 2024-03-02 MED ORDER — SODIUM CHLORIDE 0.9 % FLUSH FOR PUMPS *I*: 0.0000 mL/h | INTRAVENOUS | Status: DC | PRN

## 2024-03-02 MED ORDER — ALBUTEROL SULFATE HFA 108 (90 BASE) MCG/ACT IN AERS *I*
1.0000 | INHALATION_SPRAY | Freq: Four times a day (QID) | RESPIRATORY_TRACT | 1 refills | Status: AC | PRN
  Filled 2024-03-02: qty 18, 25d supply, fill #0

## 2024-03-02 MED ORDER — LOSARTAN POTASSIUM 50 MG PO TABS *I*
50.0000 mg | ORAL_TABLET | Freq: Once | ORAL | Status: AC
  Administered 2024-03-02: 50 mg via ORAL
  Filled 2024-03-02: qty 1

## 2024-03-02 MED ORDER — LOSARTAN POTASSIUM 50 MG PO TABS *I*
50.0000 mg | ORAL_TABLET | Freq: Every day | ORAL | 0 refills | Status: AC
  Filled 2024-03-02: qty 30, 30d supply, fill #0
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
## 2024-03-02 NOTE — Discharge Instructions (Addendum)
 PLEASE REVIEW ALL INSTRUCTIONS FOR DETAILS AND CONTENT CONTAINED IN THE DISCHARGE MATERIALS NOT COVERED AT DISCHARGE.Thank you Jenna [name] for coming to [facility] for your health care concerns. You were seen for worsening shortness of breath. Your exam, labs, chest xray and vital signs were all reassuring. Social Work was able to secure you safe placement in a housing shelter and you were provided with you home medications. At home, please continue to take you medications as previously prescribed and call your PCP to schedule close follow up next week. If your condition changes and/or worsens or for new chest pain symptoms or shortness of breath that does not improve with rest or your home inhaler or any loss of consciousness or injuries please return to the Emergency Department.In the event of an emergency please dial 911.

## 2024-03-02 NOTE — ED Notes (Signed)
 Patient returned from [facility]- She stated she had to talk to her sister. She self removed tele. Declined being hooked back up. Provider here when she returned.

## 2024-03-02 NOTE — Progress Notes (Addendum)
 [facility] SOCIAL WORK  PHARMACY FORM Todays date:  December 19, 2025Patient Name: Jenna [name]    Medical Record #: [id number] DOB: 07/30/1987Patients Address: [address] [place]          Social Worker: Geni Eaton, LMSW     Date of Service: March 02, 2024   Funding Source: [place] Medication Assistance Fund___________________________________________________________________Pharmacy Information:Date/time sent: March 02, 2024   Time needed: ASAP Patient Location: EDMedication Pick-up Preference: Patient will pick up at the pharmacyPharmacy Contact: BenSocial work signature: Geni Eaton, LMSWSupervisor/Manager Approval (if indicated):  Date:(supervisor signature not required for Medicaid pending)Pt unable to get medications from home, needs a supply of her medications for discharge.Geni Eaton, LMSWED Social [phone] or [phone]

## 2024-03-02 NOTE — ED Notes (Signed)
 Pt discharged per provider. Discharge instructions reviewed, pt verbalized understanding. Ivs removed.declined vitals. A&Ox4. Pt left ED ambulatory independently with all personal belongings and access to residence.

## 2024-03-02 NOTE — ED Notes (Signed)
 Patient ambulated to bathroom independently on room air. When returning to bed SpO2 was 99% on room air

## 2024-03-02 NOTE — ED Notes (Signed)
 Report Given ToStacy, RNDescriptive Sentence / Reason for Admission Pt called EMS due to feeling SOB today, pt states that she feels SOB at baseline but today feels like she cannot take a full breathe in. Pt lethargic for EMS, pt A&Ox4 in triage. Pt placed on 4L NC by EMS for RA saturation of 90%. EKG in triage. Hx: COPD and CHF.Active Issues / Relevant Events FULL CODEA/Ox4Ambulatory Currently RA Trop 14 --> Hx of COPD and CHFTo Do ListVS/AMeds per MARPain management 3 hr Trop @ 1300Anticipatory Guidance / Discharge PlanningPending

## 2024-03-02 NOTE — ED Triage Notes (Addendum)
 Pt called EMS due to feeling SOB today, pt states that she feels SOB at baseline but today feels like she cannot take a full breathe in. Pt lethargic for EMS, pt A&Ox4 in triage. Pt placed on 4L NC by EMS for RA saturation of 90%. EKG in triage. Hx: COPD and CHF.   Prehospital medications given: YesRespiratory/Allergic Reaction: DuoNeb (x1)

## 2024-03-02 NOTE — ED Notes (Signed)
 Patient up and walking around- continues to remove telemetry monitoring to do so.

## 2024-03-03 LAB — HOLD SST

## 2024-03-04 ENCOUNTER — Other Ambulatory Visit: Payer: Self-pay

## 2024-03-05 ENCOUNTER — Other Ambulatory Visit: Payer: Self-pay

## 2024-03-06 ENCOUNTER — Other Ambulatory Visit: Payer: Self-pay

## 2024-03-15 LAB — EKG 12-LEAD
P: 77 deg
PR: 151 ms
QRS: 62 deg
QRSD: 94 ms
QT: 393 ms
QTc: 468 ms
Rate: 85 {beats}/min
T: 99 deg

## 2024-03-21 ENCOUNTER — Emergency Department

## 2024-03-21 ENCOUNTER — Emergency Department
Admission: EM | Admit: 2024-03-21 | Discharge: 2024-03-21 | Discharge status: Against Medical Advice | Payer: Self-pay | Source: Ambulatory Visit | Attending: Student in an Organized Health Care Education/Training Program | Admitting: Student in an Organized Health Care Education/Training Program

## 2024-03-21 ENCOUNTER — Other Ambulatory Visit: Payer: Self-pay

## 2024-03-21 DIAGNOSIS — R9431 Abnormal electrocardiogram [ECG] [EKG]: Secondary | ICD-10-CM

## 2024-03-21 DIAGNOSIS — I517 Cardiomegaly: Secondary | ICD-10-CM

## 2024-03-21 DIAGNOSIS — R918 Other nonspecific abnormal finding of lung field: Secondary | ICD-10-CM

## 2024-03-21 DIAGNOSIS — Z789 Other specified health status: Secondary | ICD-10-CM

## 2024-03-21 DIAGNOSIS — Z5321 Procedure and treatment not carried out due to patient leaving prior to being seen by health care provider: Secondary | ICD-10-CM | POA: Insufficient documentation

## 2024-03-21 LAB — CBC AND DIFFERENTIAL
Baso # K/uL: 0 THOU/uL (ref 0.0–0.2)
Eos # K/uL: 0.1 THOU/uL (ref 0.0–0.5)
Hematocrit: 30 % — ABNORMAL LOW (ref 34–49)
Hemoglobin: 8.1 g/dL — ABNORMAL LOW (ref 11.2–16.0)
IMM Granulocytes #: 0.2 THOU/uL — ABNORMAL HIGH (ref 0–0)
IMM Granulocytes: 1.6 %
Lymph # K/uL: 1 THOU/uL (ref 1.0–5.0)
MCV: 78 fL (ref 75–100)
Mono # K/uL: 0.3 THOU/uL (ref 0.1–1.0)
Neut # K/uL: 13 THOU/uL — ABNORMAL HIGH (ref 1.5–6.5)
Nucl RBC # K/uL: 0.1 THOU/uL (ref 0.0–0.1)
Nucl RBC %: 0.3 /100{WBCs} — ABNORMAL HIGH (ref 0.0–0.2)
Platelets: 335 THOU/uL (ref 150–450)
RBC: 3.9 MIL/uL — ABNORMAL LOW (ref 4.0–5.5)
RDW: 18.1 % — ABNORMAL HIGH (ref 0.0–15.0)
Seg Neut %: 89.3 %
WBC: 14.6 THOU/uL — ABNORMAL HIGH (ref 3.5–11.0)

## 2024-03-21 LAB — RUQ PANEL (ED ONLY)
ALT: 18 U/L (ref 0–35)
AST: 33 U/L (ref 0–35)
Albumin: 4.2 g/dL (ref 3.5–5.2)
Alk Phos: 110 U/L — ABNORMAL HIGH (ref 35–105)
Amylase: 51 U/L (ref 28–100)
Bilirubin,Direct: <0.2 mg/dL (ref 0.0–0.3)
Bilirubin,Total: <0.2 mg/dL (ref 0.0–1.2)
Lipase: 26 U/L (ref 13–60)
Total Protein: 7.4 g/dL (ref 6.3–7.7)

## 2024-03-21 LAB — BASIC METABOLIC PANEL
Anion Gap: 12 (ref 7–16)
CO2: 24 mmol/L (ref 20–28)
Calcium: 8.8 mg/dL (ref 8.8–10.2)
Chloride: 102 mmol/L (ref 96–108)
Creatinine: 0.88 mg/dL (ref 0.51–0.95)
Glucose: 160 mg/dL — ABNORMAL HIGH (ref 60–99)
Lab: 13 mg/dL (ref 6–20)
Potassium: 4.6 mmol/L (ref 3.3–5.1)
Sodium: 138 mmol/L (ref 133–145)
eGFR BY CREAT: 86

## 2024-03-21 LAB — PREGNANCY TEST, SERUM: Preg,Serum: NEGATIVE

## 2024-03-21 LAB — TROPONIN T 0 HR HIGH SENSITIVITY (IP/ED ONLY): TROP T 0 HR High Sensitivity: 12 ng/L — ABNORMAL HIGH (ref 0–11)

## 2024-03-21 LAB — HOLD BLUE

## 2024-03-21 MED ORDER — DEXTROSE 5 % FLUSH FOR PUMPS *I*: 0.0000 mL/h | INTRAVENOUS | Status: DC | PRN

## 2024-03-21 MED ORDER — SODIUM CHLORIDE 0.9 % FLUSH FOR PUMPS *I*: 0.0000 mL/h | INTRAVENOUS | Status: DC | PRN

## 2024-03-21 NOTE — ED Notes (Signed)
 Patient states she  just went outside to smoke  no O2  RA sat 97%

## 2024-03-21 NOTE — ED Triage Notes (Signed)
 Hx COPD and CHF. C/o SOB x1 hour. Per EMS wheezy on arrival. Improved after nebs. Also reporting chest pain non radiating. EKG in triage.  Blood Glucose Meter (mg/dl): 890Emzyndepujo medications given: YesRespiratory/Allergic Reaction: Decadron ; DuoNeb (10 mg decadron , x3 duoneb)

## 2024-03-22 ENCOUNTER — Ambulatory Visit: Admitting: Family Medicine

## 2024-03-23 ENCOUNTER — Encounter: Payer: Self-pay | Admitting: Gastroenterology

## 2024-03-23 ENCOUNTER — Other Ambulatory Visit: Payer: Self-pay

## 2024-03-24 LAB — EKG 12-LEAD
P: 57 deg
PR: 165 ms
QRS: 5 deg
QRSD: 91 ms
QT: 499 ms
QTc: 577 ms
Rate: 80 {beats}/min
T: -21 deg

## 2024-03-26 ENCOUNTER — Other Ambulatory Visit: Payer: Self-pay

## 2024-03-28 ENCOUNTER — Other Ambulatory Visit: Payer: Self-pay

## 2024-03-28 ENCOUNTER — Encounter: Payer: MEDICAID | Admitting: Family

## 2024-03-30 ENCOUNTER — Other Ambulatory Visit: Payer: Self-pay
